# Patient Record
Sex: Male | Born: 1935
Health system: Southern US, Community
[De-identification: ages and names within clinical notes are randomized; demographics above are authoritative.]

## PROBLEM LIST (undated history)

## (undated) DIAGNOSIS — I10 Essential (primary) hypertension: Secondary | ICD-10-CM

## (undated) DIAGNOSIS — E119 Type 2 diabetes mellitus without complications: Secondary | ICD-10-CM

## (undated) DIAGNOSIS — K219 Gastro-esophageal reflux disease without esophagitis: Secondary | ICD-10-CM

## (undated) DIAGNOSIS — J189 Pneumonia, unspecified organism: Secondary | ICD-10-CM

## (undated) DIAGNOSIS — J329 Chronic sinusitis, unspecified: Secondary | ICD-10-CM

## (undated) DIAGNOSIS — I7300 Raynaud's syndrome without gangrene: Secondary | ICD-10-CM

## (undated) DIAGNOSIS — K5790 Diverticulosis of intestine, part unspecified, without perforation or abscess without bleeding: Secondary | ICD-10-CM

## (undated) DIAGNOSIS — J449 Chronic obstructive pulmonary disease, unspecified: Secondary | ICD-10-CM

## (undated) DIAGNOSIS — IMO0001 Reserved for inherently not codable concepts without codable children: Secondary | ICD-10-CM

## (undated) DIAGNOSIS — R251 Tremor, unspecified: Secondary | ICD-10-CM

## (undated) DIAGNOSIS — K602 Anal fissure, unspecified: Secondary | ICD-10-CM

## (undated) HISTORY — PX: HERNIA REPAIR: SHX51

## (undated) HISTORY — DX: Anal fissure, unspecified: K60.2

---

## 2004-06-27 ENCOUNTER — Encounter: Admission: RE | Admit: 2004-06-27 | Discharge: 2004-06-27 | Payer: Self-pay | Admitting: Orthopedic Surgery

## 2004-06-28 ENCOUNTER — Ambulatory Visit (HOSPITAL_COMMUNITY): Admission: RE | Admit: 2004-06-28 | Discharge: 2004-06-28 | Payer: Self-pay | Admitting: Orthopedic Surgery

## 2004-06-28 ENCOUNTER — Ambulatory Visit (HOSPITAL_BASED_OUTPATIENT_CLINIC_OR_DEPARTMENT_OTHER): Admission: RE | Admit: 2004-06-28 | Discharge: 2004-06-28 | Payer: Self-pay | Admitting: Orthopedic Surgery

## 2004-08-02 ENCOUNTER — Ambulatory Visit (HOSPITAL_COMMUNITY): Admission: RE | Admit: 2004-08-02 | Discharge: 2004-08-02 | Payer: Self-pay | Admitting: Orthopedic Surgery

## 2005-03-27 HISTORY — PX: COLONOSCOPY: SHX174

## 2008-03-21 ENCOUNTER — Encounter: Admission: RE | Admit: 2008-03-21 | Discharge: 2008-03-21 | Payer: Self-pay | Admitting: Internal Medicine

## 2010-06-08 NOTE — Op Note (Signed)
Johnathan [name], Johnathan [name]                 ACCOUNT NO.:  [id number]   MEDICAL RECORD NO.:  [id number]          PATIENT TYPE:  AMB   LOCATION:  DSC                          FACILITY:  [facility]   PHYSICIAN:  Katy Fitch. Sypher, M.D. 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 Thad Ranger, M.D., neurologist, on April 18, 2004.   POSTOPERATIVE DIAGNOSIS:  Chronic severe ulnar neuropathy at left cubital  tunnel with positive EMG and nerve conduction studies performed by Casimiro Needle  L. Thad Ranger, M.D., neurologist, on April 18, 2004.   OPERATION:  Decompression of left ulnar nerve at cubital tunnel.   OPERATIONS:  Katy Fitch. Sypher, M.D.   ASSISTANT:  Molly Maduro Dasnoit PA-C.   ANESTHESIA:  General by LMA, supervising anesthesiologist Dr. Sheldon Silvan.   INDICATIONS:  Johnathan [name] is a 77 year old right-hand dominant man referred  by Genene Churn. Love, M.D., neurologist, for evaluation and management of  chronic left ulnar neuropathy.  He had a history of numbness in his left  ring and small fingers dating back to approximately February 2006.  He was  initially evaluated by Dr. Janey Greaser, his family physician, and subsequently  referred to Dr. Sandria Manly for a neurologic evaluation.  Dr. Sandria Manly referred him for  electrodiagnostic studies that were completed by Dr. Kelli Hope,  neurologist, on April 18, 2004.   These were notable for significant left ulnar neuropathy with a slowing of  his conduction velocity to 36 meters per second across the elbow with  significant abnormalities noted on EMG of the flexor digitorum profundus of  the ring and small fingers in the forearm and the first dorsal interosseous,  suggesting a high ulnar nerve lesion at the level of the elbow.   Mr.  Truss was subsequently referred by Dr. Sandria Manly for a surgical opinion  regarding his ulnar neuropathy.   Clinical examination on Jun 06, 2004, demonstrated marked weakness of pinch  and ulnar-sided grasp.  The clinical examination was compatible with a McGowan stage III ulnar  neuropathy at the elbow.   We recommended to Mr. Remer that we proceed with decompression of his left  ulnar nerve across the elbow leaving the nerve in situ.   He understands that due to the severe abnormalities of his nerve function at  this time, that he will likely require between 12 and 18 months to see the  full benefit of nerve decompression.   After informed consent, he is brought to the operating room at this time.   PROCEDURE:  Johnathan [name] was brought to the operating room and placed in  supine position on the operating table.   Following anesthesia consultation with Dr. Ivin Booty, general anesthesia by LMA  was induced.   The left arm was prepped with Betadine soap and solution and sterilely  draped.   A pneumatic tourniquet was applied to the proximal brachium.  Following exsanguination of the left arm with an Esmarch bandage, the  arterial tourniquet was inflated to 220 mmHg.   The procedure commenced with a 2-cm scission just posterior to the ulnar  nerve at the cubital tunnel.  The subcutaneous tissues were carefully divided, taking care to identify the  posterior medial antebrachial cutaneous nerve.   The ulnar nerve was identified and the fascia overlying the nerve released 6  cm proximal to the cubital tunnel, releasing the arcade of Struthers.  Distal dissection released the arcuate ligament and the fascia at the head  of flexor carpi ulnaris.   There were several fascial bands deep to the head of flexor carpi ulnaris  causing compression.   With elbow flexion thereafter to 140 degrees, the nerve was stable.  The  fascia of the triceps was causing some compression of the nerve with  elbow  flexion beyond 100 degrees.  Therefore, a strip of the triceps fascia  measuring 5 mm in width was resected at the level the cubital tunnel and  proximally over a distance of 2 cm.  This fully decompressed the nerve.   The nerve remained stable through a full range of motion.   The wound was then repaired with intradermal 3-0 Prolene suture.   A compressive dressing was applied with Steri-Strips, sterile gauze and a  Tegaderm dressing, followed by Ace wrap.   There were no apparent complications.       RVS/MEDQ  D:  06/28/2004  T:  06/28/2004  Job:  [id number]   cc:   Genene Churn. Love, M.D.  [address]  [address]  [place]  [place] [address]  Fax: [phone]   Lyndee Leo. Janey Greaser, MD  [phone] [address]  [place]  [place] [address]  Fax: [phone]

## 2014-04-21 ENCOUNTER — Encounter (HOSPITAL_COMMUNITY): Payer: Self-pay | Admitting: Internal Medicine

## 2014-04-21 ENCOUNTER — Inpatient Hospital Stay (HOSPITAL_COMMUNITY)
Admission: AD | Admit: 2014-04-21 | Discharge: 2014-04-23 | DRG: 195 | Disposition: A | Discharge status: Home / Self Care | Payer: Medicare Other | Source: Ambulatory Visit | Attending: Internal Medicine | Admitting: Internal Medicine

## 2014-04-21 ENCOUNTER — Inpatient Hospital Stay (HOSPITAL_COMMUNITY): Payer: Medicare Other

## 2014-04-21 DIAGNOSIS — R0600 Dyspnea, unspecified: Secondary | ICD-10-CM

## 2014-04-21 DIAGNOSIS — K219 Gastro-esophageal reflux disease without esophagitis: Secondary | ICD-10-CM | POA: Diagnosis present

## 2014-04-21 DIAGNOSIS — R739 Hyperglycemia, unspecified: Secondary | ICD-10-CM | POA: Diagnosis present

## 2014-04-21 DIAGNOSIS — J189 Pneumonia, unspecified organism: Principal | ICD-10-CM | POA: Diagnosis present

## 2014-04-21 DIAGNOSIS — I7300 Raynaud's syndrome without gangrene: Secondary | ICD-10-CM | POA: Diagnosis present

## 2014-04-21 DIAGNOSIS — Z87891 Personal history of nicotine dependence: Secondary | ICD-10-CM

## 2014-04-21 DIAGNOSIS — J449 Chronic obstructive pulmonary disease, unspecified: Secondary | ICD-10-CM | POA: Diagnosis present

## 2014-04-21 DIAGNOSIS — J441 Chronic obstructive pulmonary disease with (acute) exacerbation: Secondary | ICD-10-CM | POA: Diagnosis present

## 2014-04-21 DIAGNOSIS — I10 Essential (primary) hypertension: Secondary | ICD-10-CM | POA: Diagnosis present

## 2014-04-21 DIAGNOSIS — E785 Hyperlipidemia, unspecified: Secondary | ICD-10-CM | POA: Diagnosis present

## 2014-04-21 HISTORY — DX: Diverticulosis of intestine, part unspecified, without perforation or abscess without bleeding: K57.90

## 2014-04-21 HISTORY — DX: Chronic sinusitis, unspecified: J32.9

## 2014-04-21 HISTORY — DX: Gastro-esophageal reflux disease without esophagitis: K21.9

## 2014-04-21 HISTORY — DX: Essential (primary) hypertension: I10

## 2014-04-21 HISTORY — DX: Pneumonia, unspecified organism: J18.9

## 2014-04-21 HISTORY — DX: Raynaud's syndrome without gangrene: I73.00

## 2014-04-21 HISTORY — DX: Chronic obstructive pulmonary disease, unspecified: J44.9

## 2014-04-21 HISTORY — DX: Reserved for inherently not codable concepts without codable children: IMO0001

## 2014-04-21 LAB — URINALYSIS, ROUTINE W REFLEX MICROSCOPIC
GLUCOSE, UA: NEGATIVE mg/dL
Hgb urine dipstick: NEGATIVE
KETONES UR: 15 mg/dL — AB
LEUKOCYTES UA: NEGATIVE
NITRITE: NEGATIVE
Protein, ur: 30 mg/dL — AB
SPECIFIC GRAVITY, URINE: 1.03 (ref 1.005–1.030)
Urobilinogen, UA: 1 mg/dL (ref 0.0–1.0)
pH: 5 (ref 5.0–8.0)

## 2014-04-21 LAB — URINE MICROSCOPIC-ADD ON

## 2014-04-21 LAB — TROPONIN I
TROPONIN I: 0.04 ng/mL — AB (ref <0.031)
Troponin I: 0.03 ng/mL (ref <0.031)

## 2014-04-21 LAB — TSH: TSH: 1.538 u[IU]/mL (ref 0.350–4.500)

## 2014-04-21 LAB — COMPREHENSIVE METABOLIC PANEL
ALBUMIN: 3.4 g/dL — AB (ref 3.5–5.2)
ALT: 24 U/L (ref 0–53)
ANION GAP: 10 (ref 5–15)
AST: 28 U/L (ref 0–37)
Alkaline Phosphatase: 62 U/L (ref 39–117)
BUN: 13 mg/dL (ref 6–23)
CO2: 26 mmol/L (ref 19–32)
Calcium: 8.6 mg/dL (ref 8.4–10.5)
Chloride: 99 mmol/L (ref 96–112)
Creatinine, Ser: 0.92 mg/dL (ref 0.50–1.35)
GFR calc non Af Amer: 78 mL/min — ABNORMAL LOW (ref >90)
GLUCOSE: 128 mg/dL — AB (ref 70–99)
POTASSIUM: 3.5 mmol/L (ref 3.5–5.1)
Sodium: 135 mmol/L (ref 135–145)
TOTAL PROTEIN: 7.1 g/dL (ref 6.0–8.3)
Total Bilirubin: 0.9 mg/dL (ref 0.3–1.2)

## 2014-04-21 LAB — CBC WITH DIFFERENTIAL/PLATELET
Basophils Absolute: 0 10*3/uL (ref 0.0–0.1)
Basophils Relative: 0 % (ref 0–1)
EOS PCT: 0 % (ref 0–5)
Eosinophils Absolute: 0 10*3/uL (ref 0.0–0.7)
HEMATOCRIT: 42.3 % (ref 39.0–52.0)
HEMOGLOBIN: 14.3 g/dL (ref 13.0–17.0)
LYMPHS PCT: 8 % — AB (ref 12–46)
Lymphs Abs: 0.9 10*3/uL (ref 0.7–4.0)
MCH: 32.4 pg (ref 26.0–34.0)
MCHC: 33.8 g/dL (ref 30.0–36.0)
MCV: 95.7 fL (ref 78.0–100.0)
MONO ABS: 1.2 10*3/uL — AB (ref 0.1–1.0)
MONOS PCT: 11 % (ref 3–12)
NEUTROS ABS: 9.1 10*3/uL — AB (ref 1.7–7.7)
Neutrophils Relative %: 81 % — ABNORMAL HIGH (ref 43–77)
Platelets: 158 10*3/uL (ref 150–400)
RBC: 4.42 MIL/uL (ref 4.22–5.81)
RDW: 13.5 % (ref 11.5–15.5)
WBC: 11.3 10*3/uL — AB (ref 4.0–10.5)

## 2014-04-21 LAB — CK TOTAL AND CKMB (NOT AT ARMC)
CK, MB: 2.3 ng/mL (ref 0.3–4.0)
Relative Index: INVALID (ref 0.0–2.5)
Total CK: 90 U/L (ref 7–232)

## 2014-04-21 LAB — D-DIMER, QUANTITATIVE: D-Dimer, Quant: 0.95 ug/mL-FEU — ABNORMAL HIGH (ref 0.00–0.48)

## 2014-04-21 MED ORDER — ATORVASTATIN CALCIUM 80 MG PO TABS
80.0000 mg | ORAL_TABLET | Freq: Every day | ORAL | Status: DC
  Administered 2014-04-21 – 2014-04-23 (×3): 80 mg via ORAL
  Filled 2014-04-21 (×3): qty 1

## 2014-04-21 MED ORDER — ASPIRIN EC 325 MG PO TBEC
325.0000 mg | DELAYED_RELEASE_TABLET | Freq: Every day | ORAL | Status: DC
  Administered 2014-04-21 – 2014-04-23 (×3): 325 mg via ORAL
  Filled 2014-04-21 (×3): qty 1

## 2014-04-21 MED ORDER — ENOXAPARIN SODIUM 40 MG/0.4ML ~~LOC~~ SOLN
40.0000 mg | SUBCUTANEOUS | Status: DC
  Administered 2014-04-21 – 2014-04-23 (×3): 40 mg via SUBCUTANEOUS
  Filled 2014-04-21 (×3): qty 0.4

## 2014-04-21 MED ORDER — LEVOFLOXACIN IN D5W 500 MG/100ML IV SOLN
500.0000 mg | INTRAVENOUS | Status: DC
  Administered 2014-04-21 – 2014-04-23 (×3): 500 mg via INTRAVENOUS
  Filled 2014-04-21 (×3): qty 100

## 2014-04-21 MED ORDER — ACETAMINOPHEN 325 MG PO TABS: 650.0000 mg | ORAL_TABLET | Freq: Four times a day (QID) | ORAL | Status: DC | PRN

## 2014-04-21 MED ORDER — DILTIAZEM HCL 30 MG PO TABS
30.0000 mg | ORAL_TABLET | Freq: Two times a day (BID) | ORAL | Status: DC
Start: 2014-04-21 — End: 2014-04-23
  Administered 2014-04-21 – 2014-04-23 (×4): 30 mg via ORAL
  Filled 2014-04-21 (×5): qty 1

## 2014-04-21 MED ORDER — BUDESONIDE-FORMOTEROL FUMARATE 160-4.5 MCG/ACT IN AERO
2.0000 | INHALATION_SPRAY | Freq: Two times a day (BID) | RESPIRATORY_TRACT | Status: DC
  Administered 2014-04-22 – 2014-04-23 (×4): 2 via RESPIRATORY_TRACT
  Filled 2014-04-21: qty 6

## 2014-04-21 MED ORDER — IOHEXOL 350 MG/ML SOLN
100.0000 mL | Freq: Once | INTRAVENOUS | Status: AC | PRN
  Administered 2014-04-21: 100 mL via INTRAVENOUS

## 2014-04-21 MED ORDER — SODIUM CHLORIDE 0.9 % IV SOLN
INTRAVENOUS | Status: AC
  Administered 2014-04-21: 14:00:00 via INTRAVENOUS

## 2014-04-21 MED ORDER — ALBUTEROL SULFATE (2.5 MG/3ML) 0.083% IN NEBU
2.5000 mg | INHALATION_SOLUTION | Freq: Four times a day (QID) | RESPIRATORY_TRACT | Status: DC | PRN
  Administered 2014-04-21: 2.5 mg via RESPIRATORY_TRACT
  Filled 2014-04-21: qty 3

## 2014-04-21 MED ORDER — ACETAMINOPHEN 650 MG RE SUPP: 650.0000 mg | Freq: Four times a day (QID) | RECTAL | Status: DC | PRN

## 2014-04-21 MED ORDER — SODIUM CHLORIDE 0.9 % IJ SOLN
3.0000 mL | Freq: Two times a day (BID) | INTRAMUSCULAR | Status: DC
  Administered 2014-04-21 – 2014-04-23 (×4): 3 mL via INTRAVENOUS

## 2014-04-21 MED ORDER — PRIMIDONE 50 MG PO TABS
50.0000 mg | ORAL_TABLET | Freq: Two times a day (BID) | ORAL | Status: DC
  Administered 2014-04-21 – 2014-04-23 (×4): 50 mg via ORAL
  Filled 2014-04-21 (×5): qty 1

## 2014-04-21 MED ORDER — TIOTROPIUM BROMIDE MONOHYDRATE 18 MCG IN CAPS
18.0000 ug | ORAL_CAPSULE | Freq: Every day | RESPIRATORY_TRACT | Status: DC
  Administered 2014-04-22 – 2014-04-23 (×2): 18 ug via RESPIRATORY_TRACT
  Filled 2014-04-21: qty 5

## 2014-04-21 NOTE — H&P (Signed)
Johnathan [name] is an 81 y.o. male.    [name] pcp   Chief Complaint: dyspnea, tachycardia HPI:  81 yo male with Copd not on home o2, who presented to the office with complaints of cough , and fever.  Pt notes mostly dry cough.   Pt denies cp, palp, n/v, diarrhea, brbpr, black stool.  CXR in office showed right mid lung airspace disease, and pt had tachycardia in the office and therefore sent for direct admission for pneumonia.    Past Medical History  Diagnosis Date  . COPD (chronic obstructive pulmonary disease)   . Raynaud's disease   . Sinusitis   . Diverticulosis   . Hypertension   . Shortness of breath dyspnea   . Pneumonia   . GERD (gastroesophageal reflux disease)     uses prilosec     Past Surgical History  Procedure Laterality Date  . Hernia repair    . Colonoscopy  03/27/2005    History reviewed. No pertinent family history.  Fhx:  + for dementia, dm2 of brother  Social History:  reports that he quit smoking about 20 years ago. His smoking use included Cigarettes. He has a 45 pack-year smoking history. He has never used smokeless tobacco. He reports that he drinks alcohol. He reports that he does not use illicit drugs.  Allergies: No Known Allergies  Medications Prior to Admission  Medication Sig Dispense Refill  . amLODipine (NORVASC) 10 MG tablet Take 10 mg by mouth daily.    Marland Kitchen dextromethorphan-guaiFENesin (MUCINEX DM) 30-600 MG per 12 hr tablet Take 1 tablet by mouth 2 (two) times daily.    . Ginkgo Biloba EXTR Take 1 tablet by mouth daily.    . Misc Natural Products (HIMALAYAN GOJI PO) Take 12 tablets by mouth daily.    . Multiple Vitamins-Minerals (MULTIVITAMIN ADULT) TABS Take 1 tablet by mouth daily.    . pravastatin (PRAVACHOL) 10 MG tablet Take 10 mg by mouth daily.    . primidone (MYSOLINE) 50 MG tablet Take 50 mg by mouth 2 (two) times daily.    . Tetrahydrozoline HCl (VISINE OP) Apply 1 drop to eye daily.      Results for orders placed or performed  during the hospital encounter of 04/21/14 (from the past 48 hour(s))  Urinalysis, Routine w reflex microscopic     Status: Abnormal   Collection Time: 04/21/14  2:25 PM  Result Value Ref Range   Color, Urine AMBER (A) YELLOW    Comment: BIOCHEMICALS MAY BE AFFECTED BY COLOR   APPearance CLEAR CLEAR   Specific Gravity, Urine 1.030 1.005 - 1.030   pH 5.0 5.0 - 8.0   Glucose, UA NEGATIVE NEGATIVE mg/dL   Hgb urine dipstick NEGATIVE NEGATIVE   Bilirubin Urine SMALL (A) NEGATIVE   Ketones, ur 15 (A) NEGATIVE mg/dL   Protein, ur 30 (A) NEGATIVE mg/dL   Urobilinogen, UA 1.0 0.0 - 1.0 mg/dL   Nitrite NEGATIVE NEGATIVE   Leukocytes, UA NEGATIVE NEGATIVE  Urine microscopic-add on     Status: Abnormal   Collection Time: 04/21/14  2:25 PM  Result Value Ref Range   Squamous Epithelial / LPF RARE RARE   WBC, UA 0-2 <3 WBC/hpf   RBC / HPF 0-2 <3 RBC/hpf   Bacteria, UA FEW (A) RARE   Crystals CA OXALATE CRYSTALS (A) NEGATIVE   Urine-Other MUCOUS PRESENT   CBC with Differential/Platelet     Status: Abnormal   Collection Time: 04/21/14  3:10 PM  Result Value  Ref Range   WBC 11.3 (H) 4.0 - 10.5 K/uL   RBC 4.42 4.22 - 5.81 MIL/uL   Hemoglobin 14.3 13.0 - 17.0 g/dL   HCT 42.3 39.0 - 52.0 %   MCV 95.7 78.0 - 100.0 fL   MCH 32.4 26.0 - 34.0 pg   MCHC 33.8 30.0 - 36.0 g/dL   RDW 13.5 11.5 - 15.5 %   Platelets 158 150 - 400 K/uL   Neutrophils Relative % 81 (H) 43 - 77 %   Neutro Abs 9.1 (H) 1.7 - 7.7 K/uL   Lymphocytes Relative 8 (L) 12 - 46 %   Lymphs Abs 0.9 0.7 - 4.0 K/uL   Monocytes Relative 11 3 - 12 %   Monocytes Absolute 1.2 (H) 0.1 - 1.0 K/uL   Eosinophils Relative 0 0 - 5 %   Eosinophils Absolute 0.0 0.0 - 0.7 K/uL   Basophils Relative 0 0 - 1 %   Basophils Absolute 0.0 0.0 - 0.1 K/uL  Comprehensive metabolic panel     Status: Abnormal   Collection Time: 04/21/14  3:10 PM  Result Value Ref Range   Sodium 135 135 - 145 mmol/L   Potassium 3.5 3.5 - 5.1 mmol/L   Chloride 99 96 - 112  mmol/L   CO2 26 19 - 32 mmol/L   Glucose, Bld 128 (H) 70 - 99 mg/dL   BUN 13 6 - 23 mg/dL   Creatinine, Ser 0.92 0.50 - 1.35 mg/dL   Calcium 8.6 8.4 - 10.5 mg/dL   Total Protein 7.1 6.0 - 8.3 g/dL   Albumin 3.4 (L) 3.5 - 5.2 g/dL   AST 28 0 - 37 U/L   ALT 24 0 - 53 U/L   Alkaline Phosphatase 62 39 - 117 U/L   Total Bilirubin 0.9 0.3 - 1.2 mg/dL   GFR calc non Af Amer 78 (L) >90 mL/min   GFR calc Af Amer >90 >90 mL/min    Comment: (NOTE) The eGFR has been calculated using the CKD EPI equation. This calculation has not been validated in all clinical situations. eGFR's persistently <90 mL/min signify possible Chronic Kidney Disease.    Anion gap 10 5 - 15  TSH     Status: None   Collection Time: 04/21/14  3:10 PM  Result Value Ref Range   TSH 1.538 0.350 - 4.500 uIU/mL  D-dimer, quantitative     Status: Abnormal   Collection Time: 04/21/14  3:10 PM  Result Value Ref Range   D-Dimer, Quant 0.95 (H) 0.00 - 0.48 ug/mL-FEU    Comment:        AT THE INHOUSE ESTABLISHED CUTOFF VALUE OF 0.48 ug/mL FEU, THIS ASSAY HAS BEEN DOCUMENTED IN THE LITERATURE TO HAVE A SENSITIVITY AND NEGATIVE PREDICTIVE VALUE OF AT LEAST 98 TO 99%.  THE TEST RESULT SHOULD BE CORRELATED WITH AN ASSESSMENT OF THE CLINICAL PROBABILITY OF DVT / VTE.   Troponin I (q 6hr x 3)     Status: Abnormal   Collection Time: 04/21/14  3:10 PM  Result Value Ref Range   Troponin I 0.04 (H) <0.031 ng/mL    Comment:        PERSISTENTLY INCREASED TROPONIN VALUES IN THE RANGE OF 0.04-0.49 ng/mL CAN BE SEEN IN:       -UNSTABLE ANGINA       -CONGESTIVE HEART FAILURE       -MYOCARDITIS       -CHEST TRAUMA       -ARRYHTHMIAS       -  LATE PRESENTING MYOCARDIAL INFARCTION       -COPD   CLINICAL FOLLOW-UP RECOMMENDED.   Culture, blood (routine x 2)     Status: None (Preliminary result)   Collection Time: 04/21/14  3:30 PM  Result Value Ref Range   Specimen Description BLOOD RIGHT HAND    Special Requests BOTTLES DRAWN  AEROBIC ONLY 6CC    Culture PENDING    Report Status PENDING    No results found.  Review of Systems  Constitutional: Positive for fever. Negative for chills, weight loss, malaise/fatigue and diaphoresis.  HENT: Negative.   Eyes: Negative.   Respiratory: Positive for cough, sputum production and shortness of breath. Negative for hemoptysis and wheezing.   Cardiovascular: Negative.   Gastrointestinal: Negative.   Genitourinary: Negative.   Musculoskeletal: Negative.   Skin: Negative.   Neurological: Negative.  Negative for weakness.  Endo/Heme/Allergies: Negative.   Psychiatric/Behavioral: Negative.     Blood pressure 104/61, pulse 126, temperature 100.1 F (37.8 C), temperature source Oral, resp. rate 18, SpO2 92 %. Physical Exam  Constitutional: He is oriented to person, place, and time. He appears well-developed and well-nourished.  HENT:  Head: Normocephalic and atraumatic.  Mouth/Throat: No oropharyngeal exudate.  Eyes: Conjunctivae and EOM are normal. Pupils are equal, round, and reactive to light. No scleral icterus.  Neck: Normal range of motion. Neck supple. No JVD present. No tracheal deviation present. No thyromegaly present.  Cardiovascular: Normal rate and regular rhythm.   Respiratory: Effort normal. No respiratory distress. He has no wheezes. He has rales.  GI: Soft. Bowel sounds are normal. He exhibits no distension. There is no tenderness. There is no rebound and no guarding.  Musculoskeletal: Normal range of motion. He exhibits no edema or tenderness.  Lymphadenopathy:    He has no cervical adenopathy.  Neurological: He is alert and oriented to person, place, and time. He has normal reflexes. He displays normal reflexes. No cranial nerve deficit. He exhibits normal muscle tone. Coordination normal.  Skin: Skin is warm and dry. No rash noted. No erythema. No pallor.  Psychiatric: He has a normal mood and affect. His behavior is normal. Judgment and thought  content normal.     Assessment/Plan Pneumonia Sputum culture Blood culture x2 Check urine legionella antigen Check cbc cmp Start on levaquin 570m iv qday  For cap  Tachycardia Check d dimer and if positive CTA chest Trop i q6h x3 Start cardizem 320mpo bid  Copd o2 2L [place] spiriva 1puff qday Albuterol prn  DVT prophylaxis scd and lovenox  KIJani Gravel/31/2016, 8:49 PM

## 2014-04-22 LAB — LEGIONELLA ANTIGEN, URINE

## 2014-04-22 LAB — TROPONIN I: Troponin I: <0.03 ng/mL (ref <0.031)

## 2014-04-22 LAB — LACTIC ACID, PLASMA: Lactic Acid, Venous: 0.8 mmol/L (ref 0.5–2.0)

## 2014-04-22 NOTE — Progress Notes (Signed)
Subjective: Pneumonia:  Pt feels better.  Afebrile.  Hr decreased.   Tachcardia: resolved Copd:  Breathing improved.  Stable on inhalers.  Protein calorie malnutrition:  Pt is trying to eat better.   Objective: Vital signs in last 24 hours: Temp:  [98.3 F (36.8 C)-99.4 F (37.4 C)] 99.4 F (37.4 C) (04/01 2216) Pulse Rate:  [76-85] 85 (04/01 2216) Resp:  [15-17] 15 (04/01 2216) BP: (107-135)/(58-66) 135/66 mmHg (04/01 2216) SpO2:  [90 %-96 %] 90 % (04/01 2216) Weight:  [68.3 kg (150 lb 9.2 oz)] 68.3 kg (150 lb 9.2 oz) (04/01 0500) Weight change:  Last BM Date: 04/21/14  Intake/Output from previous day: 03/31 0701 - 04/01 0700 In: 506.3 [P.O.:120; I.V.:386.3] Out: 0  Intake/Output this shift:    Heent: anicteric Neck: no jvd Heart: rrr s1, s2 Lung: bron chial bs right mid lung Abd: soft, nt, nd, +bs Ext: no c/c/e   Lab Results:  Recent Labs  04/21/14 1510  WBC 11.3*  HGB 14.3  HCT 42.3  PLT 158   BMET  Recent Labs  04/21/14 1510  NA 135  K 3.5  CL 99  CO2 26  GLUCOSE 128*  BUN 13  CREATININE 0.92  CALCIUM 8.6    Studies/Results: Ct Angio Chest Pe W/cm &/or Wo Cm  04/21/2014   CLINICAL DATA:  Cough. Short of breath. Worsening shortness of breath over the last week. Pneumonia on radiograph earlier today.  EXAM: CT ANGIOGRAPHY CHEST WITH CONTRAST  TECHNIQUE: Multidetector CT imaging of the chest was performed using the standard protocol during bolus administration of intravenous contrast. Multiplanar CT image reconstructions and MIPs were obtained to evaluate the vascular anatomy.  CONTRAST:  128mL OMNIPAQUE IOHEXOL 350 MG/ML SOLN  COMPARISON:  04/21/2014 chest radiograph.  FINDINGS: Bones: No aggressive osseous lesions. Lower thoracic Schmorl's nodes and likely chronic T11 compression fracture.  Cardiovascular: Technically adequate study. No pulmonary embolus. No acute aortic abnormality. Aortic arch and [place] vessel atherosclerosis. Coronary artery  atherosclerosis is present. If office based assessment of coronary risk factors has not been performed, it is now recommended. Enlarged pulmonary arteries compatible with secondary pulmonary hypertension.  Lungs: LEFT lung shows dependent atelectasis. Multifocal airspace disease is present in the RIGHT lung. Of this involves predominantly the RIGHT upper and RIGHT lower lobe with minimal involvement of the RIGHT middle lobe. Atelectasis in the RIGHT middle lobe. Underlying severe emphysema is present.  Central airways: Patent.  Effusions: None.  Lymphadenopathy: No axillary adenopathy. No mediastinal adenopathy. Negative for hilar adenopathy.  Esophagus: Patulous gastroesophageal junction.  Upper abdomen: Within normal limits.  Other: None.  Review of the MIP images confirms the above findings.  IMPRESSION: 1. Multilobar multifocal pneumonia of the RIGHT lung. Followup in 4-6 weeks to ensure radiographic clearing and exclude an underlying lesion is recommended. 2. Severe centrilobular emphysema. 3. Atherosclerosis and coronary artery disease.   Electronically Signed   By: Dereck Ligas M.D.   On: 04/21/2014 20:47    Medications: I have reviewed the patient's current medications.  Assessment/Plan: Pneumonia, cap Cont levaquin 500mg  iv qday D#2  Tachycardia Resolved  Copd Cont symbicort, spiriva, albuterol  Hyperglycemia Consider hga1c   DVT prophylaxis:  lovenox   LOS: 1 day   [name] 04/22/2014, 10:25 PM

## 2014-04-23 MED ORDER — TIOTROPIUM BROMIDE MONOHYDRATE 18 MCG IN CAPS: 18.0000 ug | ORAL_CAPSULE | Freq: Every day | RESPIRATORY_TRACT | Status: DC

## 2014-04-23 MED ORDER — DILTIAZEM HCL 30 MG PO TABS: 30.0000 mg | ORAL_TABLET | Freq: Two times a day (BID) | ORAL | Status: DC

## 2014-04-23 MED ORDER — DILTIAZEM HCL 30 MG PO TABS
30.0000 mg | ORAL_TABLET | Freq: Once | ORAL | Status: AC
  Administered 2014-04-23: 30 mg via ORAL
  Filled 2014-04-23: qty 1

## 2014-04-23 MED ORDER — LEVOFLOXACIN 500 MG PO TABS: 500.0000 mg | ORAL_TABLET | Freq: Every day | ORAL | Status: DC

## 2014-04-23 MED ORDER — BENZONATATE 100 MG PO CAPS: 100.0000 mg | ORAL_CAPSULE | Freq: Three times a day (TID) | ORAL | Status: DC | PRN

## 2014-04-23 MED ORDER — HYDROCOD POLST-CHLORPHEN POLST 10-8 MG/5ML PO LQCR
5.0000 mL | Freq: Two times a day (BID) | ORAL | Status: DC | PRN
Start: 2014-04-23 — End: 2014-04-23
  Administered 2014-04-23: 5 mL via ORAL
  Filled 2014-04-23: qty 5

## 2014-04-23 MED ORDER — BENZONATATE 100 MG PO CAPS
100.0000 mg | ORAL_CAPSULE | Freq: Three times a day (TID) | ORAL | Status: DC | PRN
Start: 2014-04-23 — End: 2014-04-23
  Filled 2014-04-23: qty 1

## 2014-04-23 MED ORDER — BUDESONIDE-FORMOTEROL FUMARATE 160-4.5 MCG/ACT IN AERO: 2.0000 | INHALATION_SPRAY | Freq: Two times a day (BID) | RESPIRATORY_TRACT | Status: DC

## 2014-04-23 MED ORDER — PRIMIDONE 50 MG PO TABS
50.0000 mg | ORAL_TABLET | Freq: Once | ORAL | Status: AC
  Administered 2014-04-23: 50 mg via ORAL
  Filled 2014-04-23: qty 1

## 2014-04-23 NOTE — Progress Notes (Addendum)
Nsg Discharge Note  Admit Date:  04/21/2014 Discharge date: 04/23/2014   Johnathan [name] to be D/C'd Home per MD order.  AVS completed.  Copy for chart, and copy for patient signed, and dated. Patient/caregiver able to verbalize understanding.  Discharge Medication:   Medication List    STOP taking these medications        amLODipine 2.5 MG tablet  Commonly known as:  NORVASC      TAKE these medications        benzonatate 100 MG capsule  Commonly known as:  TESSALON  Take 1 capsule (100 mg total) by mouth 3 (three) times daily as needed for cough.     budesonide-formoterol 160-4.5 MCG/ACT inhaler  Commonly known as:  SYMBICORT  Inhale 2 puffs into the lungs 2 (two) times daily.     dextromethorphan-guaiFENesin 30-600 MG per 12 hr tablet  Commonly known as:  MUCINEX DM  Take 1 tablet by mouth 2 (two) times daily.     diltiazem 30 MG tablet  Commonly known as:  CARDIZEM  Take 1 tablet (30 mg total) by mouth 2 (two) times daily.     Ginkgo Biloba Extr  Take 1 tablet by mouth daily.     HIMALAYAN GOJI PO  Take 12 tablets by mouth daily.     levofloxacin 500 MG tablet  Commonly known as:  LEVAQUIN  Take 1 tablet (500 mg total) by mouth daily.     MULTIVITAMIN ADULT Tabs  Take 1 tablet by mouth daily.     pravastatin 40 MG tablet  Commonly known as:  PRAVACHOL  Take 40 mg by mouth daily.     primidone 50 MG tablet  Commonly known as:  MYSOLINE  Take 50 mg by mouth 2 (two) times daily.     tiotropium 18 MCG inhalation capsule  Commonly known as:  SPIRIVA  Place 1 capsule (18 mcg total) into inhaler and inhale daily.     VISINE OP  Apply 1 drop to eye daily.        Discharge Assessment: Filed Vitals:   04/23/14 1614  BP: 120/75  Pulse: 76  Temp: 98.4 F (36.9 C)  Resp: 16   Skin clean, dry and intact without evidence of skin break down, no evidence of skin tears noted. IV removed from left and right forearm. Site without signs and symptoms of  complications - no redness or edema noted at insertion site, patient denies c/o pain - only slight tenderness at site.  Dressing with slight pressure applied.  Verbal order to give Primidone and Cardizem before discharge.  D/c Instructions-Education: Discharge instructions given to patient/family with verbalized understanding. D/c education completed with patient/family including follow up instructions, medication list, d/c activities limitations if indicated, with other d/c instructions as indicated by MD - patient able to verbalize understanding, all questions fully answered. Patient instructed to return to ED, call 911, or call MD for any changes in condition.  Patient escorted via [place], and D/C home via private auto.  Wheeled down via wheelchair by staff to vehicle.  In stable condition at time of departure.  Gleason, Ginger Margaretha Sheffield, RN 04/23/2014 7:46 PM

## 2014-04-23 NOTE — Discharge Summary (Signed)
Physician Discharge Summary  Patient ID: Johnathan [name] MRN: [id number] DOB/AGE: [birth year] 81 y.o.  Admit date: 04/21/2014 Discharge date: 04/23/2014  Admission Diagnoses: Pneumonia Copd  Discharge Diagnoses:  Active Problems:   Pneumonia   COPD (chronic obstructive pulmonary disease)   Discharged Condition: stable  Hospital Course: 81 yo male with Copd, c/o cough and not feeling well, presented to the office and was felt to have pneumonia.  Pt was sent to hospital and therefore admitted,  CTA chest showed evidence of multifocal pneumonia.  Pt had tachycardia which improved with hydration.  Pt improved with iv levaquin.  Pt appears stable and will be discharged to home.   Consults: none  Significant Diagnostic Studies: labs: CTA chest  Treatments: iv levaquin  Discharge Exam: Blood pressure 120/75, pulse 76, temperature 98.4 F (36.9 C), temperature source Oral, resp. rate 16, weight 67.3 kg (148 lb 5.9 oz), SpO2 97 %. Heent: anicteric Neck: no jvd Heart: rrr s1, s2 Lung: slight bronchial bs in the right mid lung Abd: soft, nt, nd, +bs Ext: no c/c/e  A/P: Pneumonia Cont levaquin 500mg  po qday x 7 days Tessalon pearles 100mg  po tid prn  Copd Cont symbicort 2puff bid Cont spiriva 1puff qday  Hypertension Stop amlodipine cardizem 30mg  po bid  Hyperlipidemia Cont pravastatin   Dispo: f./u with pcp in 2-4 weeks.   Disposition: Final discharge disposition not confirmed    HOME     Medication List    STOP taking these medications        amLODipine 2.5 MG tablet  Commonly known as:  NORVASC      TAKE these medications        benzonatate 100 MG capsule  Commonly known as:  TESSALON  Take 1 capsule (100 mg total) by mouth 3 (three) times daily as needed for cough.     budesonide-formoterol 160-4.5 MCG/ACT inhaler  Commonly known as:  SYMBICORT  Inhale 2 puffs into the lungs 2 (two) times daily.     dextromethorphan-guaiFENesin 30-600 MG per 12 hr tablet   Commonly known as:  MUCINEX DM  Take 1 tablet by mouth 2 (two) times daily.     diltiazem 30 MG tablet  Commonly known as:  CARDIZEM  Take 1 tablet (30 mg total) by mouth 2 (two) times daily.     Ginkgo Biloba Extr  Take 1 tablet by mouth daily.     HIMALAYAN GOJI PO  Take 12 tablets by mouth daily.     levofloxacin 500 MG tablet  Commonly known as:  LEVAQUIN  Take 1 tablet (500 mg total) by mouth daily.     MULTIVITAMIN ADULT Tabs  Take 1 tablet by mouth daily.     pravastatin 40 MG tablet  Commonly known as:  PRAVACHOL  Take 40 mg by mouth daily.     primidone 50 MG tablet  Commonly known as:  MYSOLINE  Take 50 mg by mouth 2 (two) times daily.     tiotropium 18 MCG inhalation capsule  Commonly known as:  SPIRIVA  Place 1 capsule (18 mcg total) into inhaler and inhale daily.     VISINE OP  Apply 1 drop to eye daily.           Follow-up Information    Follow up with [name], MD In 4 weeks.   Specialty:  Internal Medicine   Contact information:   [address] [place] [address] [phone]       Signed: Maudie Mercury,  Jillene Wehrenberg 04/23/2014, 7:13 PM

## 2014-04-27 LAB — CULTURE, BLOOD (ROUTINE X 2)
Culture: NO GROWTH
Culture: NO GROWTH

## 2014-09-22 DIAGNOSIS — Z86010 Personal history of colonic polyps: Secondary | ICD-10-CM | POA: Diagnosis not present

## 2014-09-22 DIAGNOSIS — J449 Chronic obstructive pulmonary disease, unspecified: Secondary | ICD-10-CM | POA: Diagnosis not present

## 2015-01-06 DIAGNOSIS — I10 Essential (primary) hypertension: Secondary | ICD-10-CM | POA: Diagnosis not present

## 2015-01-06 DIAGNOSIS — E7800 Pure hypercholesterolemia, unspecified: Secondary | ICD-10-CM | POA: Diagnosis not present

## 2015-05-17 DIAGNOSIS — I10 Essential (primary) hypertension: Secondary | ICD-10-CM | POA: Diagnosis not present

## 2015-05-17 DIAGNOSIS — R739 Hyperglycemia, unspecified: Secondary | ICD-10-CM | POA: Diagnosis not present

## 2015-05-22 DIAGNOSIS — E7800 Pure hypercholesterolemia, unspecified: Secondary | ICD-10-CM | POA: Diagnosis not present

## 2015-05-22 DIAGNOSIS — R739 Hyperglycemia, unspecified: Secondary | ICD-10-CM | POA: Diagnosis not present

## 2015-05-22 DIAGNOSIS — R251 Tremor, unspecified: Secondary | ICD-10-CM | POA: Diagnosis not present

## 2015-05-22 DIAGNOSIS — I10 Essential (primary) hypertension: Secondary | ICD-10-CM | POA: Diagnosis not present

## 2015-06-20 DIAGNOSIS — I10 Essential (primary) hypertension: Secondary | ICD-10-CM | POA: Diagnosis not present

## 2015-06-20 DIAGNOSIS — Z0100 Encounter for examination of eyes and vision without abnormal findings: Secondary | ICD-10-CM | POA: Diagnosis not present

## 2015-08-18 DIAGNOSIS — I10 Essential (primary) hypertension: Secondary | ICD-10-CM | POA: Diagnosis not present

## 2015-08-18 DIAGNOSIS — R739 Hyperglycemia, unspecified: Secondary | ICD-10-CM | POA: Diagnosis not present

## 2015-08-24 DIAGNOSIS — E7800 Pure hypercholesterolemia, unspecified: Secondary | ICD-10-CM | POA: Diagnosis not present

## 2015-08-24 DIAGNOSIS — I10 Essential (primary) hypertension: Secondary | ICD-10-CM | POA: Diagnosis not present

## 2015-08-24 DIAGNOSIS — R739 Hyperglycemia, unspecified: Secondary | ICD-10-CM | POA: Diagnosis not present

## 2015-09-07 DIAGNOSIS — Z1212 Encounter for screening for malignant neoplasm of rectum: Secondary | ICD-10-CM | POA: Diagnosis not present

## 2015-12-28 DIAGNOSIS — R739 Hyperglycemia, unspecified: Secondary | ICD-10-CM | POA: Diagnosis not present

## 2015-12-28 DIAGNOSIS — I10 Essential (primary) hypertension: Secondary | ICD-10-CM | POA: Diagnosis not present

## 2016-01-03 DIAGNOSIS — E7800 Pure hypercholesterolemia, unspecified: Secondary | ICD-10-CM | POA: Diagnosis not present

## 2016-01-03 DIAGNOSIS — R05 Cough: Secondary | ICD-10-CM | POA: Diagnosis not present

## 2016-01-03 DIAGNOSIS — I10 Essential (primary) hypertension: Secondary | ICD-10-CM | POA: Diagnosis not present

## 2016-01-03 DIAGNOSIS — R739 Hyperglycemia, unspecified: Secondary | ICD-10-CM | POA: Diagnosis not present

## 2016-01-22 DIAGNOSIS — J189 Pneumonia, unspecified organism: Secondary | ICD-10-CM

## 2016-01-22 HISTORY — DX: Pneumonia, unspecified organism: J18.9

## 2017-05-15 ENCOUNTER — Encounter (HOSPITAL_COMMUNITY): Payer: Self-pay | Admitting: Emergency Medicine

## 2017-05-15 ENCOUNTER — Emergency Department (HOSPITAL_COMMUNITY)
Admission: EM | Admit: 2017-05-15 | Discharge: 2017-05-16 | Disposition: A | Discharge status: Home / Self Care | Payer: Medicare PPO | Attending: Emergency Medicine | Admitting: Emergency Medicine

## 2017-05-15 ENCOUNTER — Other Ambulatory Visit: Payer: Self-pay

## 2017-05-15 ENCOUNTER — Emergency Department (HOSPITAL_COMMUNITY): Payer: Medicare PPO

## 2017-05-15 DIAGNOSIS — Z79899 Other long term (current) drug therapy: Secondary | ICD-10-CM | POA: Insufficient documentation

## 2017-05-15 DIAGNOSIS — I10 Essential (primary) hypertension: Secondary | ICD-10-CM | POA: Insufficient documentation

## 2017-05-15 DIAGNOSIS — J40 Bronchitis, not specified as acute or chronic: Secondary | ICD-10-CM | POA: Diagnosis not present

## 2017-05-15 DIAGNOSIS — R059 Cough, unspecified: Secondary | ICD-10-CM

## 2017-05-15 DIAGNOSIS — J449 Chronic obstructive pulmonary disease, unspecified: Secondary | ICD-10-CM | POA: Insufficient documentation

## 2017-05-15 DIAGNOSIS — R05 Cough: Secondary | ICD-10-CM | POA: Diagnosis present

## 2017-05-15 DIAGNOSIS — R739 Hyperglycemia, unspecified: Secondary | ICD-10-CM | POA: Diagnosis not present

## 2017-05-15 DIAGNOSIS — R310 Gross hematuria: Secondary | ICD-10-CM | POA: Diagnosis not present

## 2017-05-15 DIAGNOSIS — Z87891 Personal history of nicotine dependence: Secondary | ICD-10-CM | POA: Diagnosis not present

## 2017-05-15 MED ORDER — ALBUTEROL SULFATE (2.5 MG/3ML) 0.083% IN NEBU
5.0000 mg | INHALATION_SOLUTION | Freq: Once | RESPIRATORY_TRACT | Status: AC
  Administered 2017-05-15: 5 mg via RESPIRATORY_TRACT
  Filled 2017-05-15: qty 6

## 2017-05-15 NOTE — ED Notes (Signed)
PT TO XRAY WILL COMPLETE EKG WHEN PT RETURNS

## 2017-05-15 NOTE — ED Triage Notes (Signed)
Patient complaining of a cough that started two days ago. Patient states he can not quit coughing. Patient also complains of blood in his urine.

## 2017-05-15 NOTE — ED Provider Notes (Signed)
[place] DEPT Provider Note   CSN: [id number] Arrival date & time: 05/15/17  2243     History   Chief Complaint Chief Complaint  Patient presents with  . Cough  . Hematuria    HPI Johnathan [name] is a 84 y.o. male.  The history is provided by the patient.  Cough   Hematuria   He has history of hypertension, COPD, GERD, diverticulosis, Raynaud's disease and comes in with 3-day history of nonproductive cough.  He denies fever or chills.  Denies any chest pain.  He denies dyspnea except during coughing paroxysms.  He has noted that cough is somewhat improved after using albuterol inhaler, but he states he only uses it when he really needs it.  He is also complaining of blood in his urine.  There is no dysuria and no abdominal pain or flank pain.  He has noted urinary urgency and tenesmus.  He has not had anything to treat this.  Past Medical History:  Diagnosis Date  . COPD (chronic obstructive pulmonary disease) ([place])   . Diverticulosis   . GERD (gastroesophageal reflux disease)    uses prilosec   . Hypertension   . Pneumonia   . Raynaud's disease   . Shortness of breath dyspnea   . Sinusitis     Patient Active Problem List   Diagnosis Date Noted  . Pneumonia 04/21/2014  . COPD (chronic obstructive pulmonary disease) ([place]) 04/21/2014    Past Surgical History:  Procedure Laterality Date  . COLONOSCOPY  03/27/2005  . HERNIA REPAIR          Home Medications    Prior to Admission medications   Medication Sig Start Date End Date Taking? Authorizing Provider  benzonatate (TESSALON) 100 MG capsule Take 1 capsule (100 mg total) by mouth 3 (three) times daily as needed for cough. 04/23/14   [name], MD  budesonide-formoterol [facility]) 160-4.5 MCG/ACT inhaler Inhale 2 puffs into the lungs 2 (two) times daily. 04/23/14   [name], MD  dextromethorphan-guaiFENesin [facility] DM) 30-600 MG per 12 hr tablet Take 1 tablet by mouth 2 (two)  times daily.    [provider]  diltiazem (CARDIZEM) 30 MG tablet Take 1 tablet (30 mg total) by mouth 2 (two) times daily. 04/23/14   [name], MD  Ginkgo Biloba EXTR Take 1 tablet by mouth daily.    [provider]  levofloxacin (LEVAQUIN) 500 MG tablet Take 1 tablet (500 mg total) by mouth daily. 04/23/14   [name], MD  Misc Natural Products (HIMALAYAN GOJI PO) Take 12 tablets by mouth daily.    [provider]  Multiple Vitamins-Minerals (MULTIVITAMIN ADULT) TABS Take 1 tablet by mouth daily.    [provider]  pravastatin (PRAVACHOL) 40 MG tablet Take 40 mg by mouth daily.    [provider]  primidone (MYSOLINE) 50 MG tablet Take 50 mg by mouth 2 (two) times daily.    [provider]  Tetrahydrozoline HCl (VISINE OP) Apply 1 drop to eye daily.    [provider]  tiotropium (SPIRIVA) 18 MCG inhalation capsule Place 1 capsule (18 mcg total) into inhaler and inhale daily. 04/23/14   [name], MD    Family History Family History  Problem Relation Age of Onset  . Dementia Brother   . Diabetes Brother     Social History Social History   Tobacco Use  . Smoking status: Former Smoker    Packs/day: 1.00    Years:  45.00    Pack years: 45.00    Types: Cigarettes    Last attempt to quit: 01/21/1994    Years since quitting: 23.3  . Smokeless tobacco: Never Used  Substance Use Topics  . Alcohol use: Yes    Comment: a drink daily with dinner  . Drug use: No     Allergies   Patient has no known allergies.   Review of Systems Review of Systems  Respiratory: Positive for cough.   Genitourinary: Positive for hematuria.  All other systems reviewed and are negative.    Physical Exam Updated Vital Signs BP (!) 157/81 (BP Location: Right Arm)   Pulse (!) 102   Temp 99.2 F (37.3 C) (Oral)   Resp 18   Ht 5' 9.5" (1.765 m)   Wt 68 kg (150 lb)   SpO2 93%   BMI 21.83 kg/m   Physical Exam  Nursing note and  vitals reviewed.  84 year old male, resting comfortably and in no acute distress. Vital signs are significant for borderline elevated heart rate, and elevated systolic blood pressure. Oxygen saturation is 93%, which is normal. Head is normocephalic and atraumatic. PERRLA, EOMI. Oropharynx is clear. Neck is nontender and supple without adenopathy or JVD. Back is nontender and there is no CVA tenderness. Lungs are have a slightly prolonged exhalation phase without overt rales, wheezes, or rhonchi. Chest is nontender. Heart has regular rate and rhythm without murmur. Abdomen is soft, flat, nontender without masses or hepatosplenomegaly and peristalsis is normoactive. Extremities have no cyanosis or edema, full range of motion is present. Skin is warm and dry without rash. Neurologic: Mental status is normal, cranial nerves are intact, there are no motor or sensory deficits.  ED Treatments / Results  Labs (all labs ordered are listed, but only abnormal results are displayed) Labs Reviewed  URINALYSIS, ROUTINE W REFLEX MICROSCOPIC - Abnormal; Notable for the following components:      Result Value   Hgb urine dipstick MODERATE (*)    Ketones, ur 5 (*)    Protein, ur 100 (*)    RBC / HPF >50 (*)    Bacteria, UA RARE (*)    All other components within normal limits  BASIC METABOLIC PANEL - Abnormal; Notable for the following components:   Glucose, Bld 161 (*)    GFR calc non Af Amer 54 (*)    All other components within normal limits  CBC - Abnormal; Notable for the following components:   WBC 15.5 (*)    All other components within normal limits  URINE CULTURE   Radiology Dg Chest 2 View  Result Date: 05/15/2017 CLINICAL DATA:  Cough times 48 hours with dyspnea. EXAM: CHEST - 2 VIEW COMPARISON:  03/18/2017 FINDINGS: Emphysematous hyperinflation of the lungs with minimal atelectasis and/or scarring at the right lung base. Heart and mediastinal contours are within normal limits.  Nonaneurysmal atherosclerotic aorta. Acute osseous abnormality. IMPRESSION: Emphysematous hyperinflation of the lungs. Right basilar scarring. No acute pulmonary consolidation or CHF. Electronically Signed   By: Ashley Royalty M.D.   On: 05/15/2017 23:45    Procedures Procedures  Medications Ordered in ED Medications  albuterol (PROVENTIL) (2.5 MG/3ML) 0.083% nebulizer solution 5 mg (5 mg Nebulization Given 05/15/17 2345)     Initial Impression / Assessment and Plan / ED Course  I have reviewed the triage vital signs and the nursing notes.  Pertinent labs & imaging results that were available during my care of the patient were reviewed by  me and considered in my medical decision making (see chart for details).  Cough which I suspect is related to his COPD, possible viral bronchitis.  Chest x-ray shows no evidence of pneumonia.  Old records are reviewed, and he does have a past admission for community-acquired pneumonia and COPD.  He will be given therapeutic trial of albuterol with ipratropium.  Gross hematuria of uncertain cause.  Suspect urinary tract infection.  Will need to check urinalysis.  No flank pain to suggest urolithiasis.  Of note, he is not on any anticoagulants.  2:17 AM Cough is much improved following albuterol with ipratropium via nebulizer.  He is given a dose of prednisone.  WBC is somewhat elevated, but this is nonspecific.  Hemoglobin and platelets are normal.  Electrolytes and renal function are normal, glucose is borderline elevated at 161.  This will need to be followed as an outpatient.  Patient has not given urine sample at this point.  Encouraged to give [place] a urine sample to check for possible infection.  6:43 AM Urinalysis has come back with greater than 50 RBCs but 0-5 WBCs and rare bacteria.  Specimen is been sent for culture.  He will be discharged with prescription for cephalexin pending urine culture report.  Also given prescriptions for prednisone and  benzonatate.  He does not have albuterol inhaler listed in his medication list, so is given a prescription for this.  He feels he has that at home, but prescription is given just in case he is mistaken.  Advised to use the inhaler and benzonatate as needed for cough suppression.  Follow-up with PCP to ensure clearing of his hematuria, consider urology referral.  Also, patient advised of elevated glucose.  This will need to be followed by his PCP.  Final Clinical Impressions(s) / ED Diagnoses   Final diagnoses:  Cough  Gross hematuria  Hyperglycemia  Bronchitis    ED Discharge Orders        Ordered    benzonatate (TESSALON) 100 MG capsule  3 times daily PRN     05/16/17 0633    predniSONE (DELTASONE) 50 MG tablet  Daily     05/16/17 0633    cephALEXin (KEFLEX) 500 MG capsule  3 times daily     05/16/17 0633    albuterol (PROVENTIL HFA;VENTOLIN HFA) 108 (90 Base) MCG/ACT inhaler  Every 4 hours PRN     33/35/45 6256       Delora Fuel, MD 38/93/73 [phone]

## 2017-05-16 LAB — CBC
HCT: 45.5 % (ref 39.0–52.0)
HEMOGLOBIN: 15.4 g/dL (ref 13.0–17.0)
MCH: 32.8 pg (ref 26.0–34.0)
MCHC: 33.8 g/dL (ref 30.0–36.0)
MCV: 97 fL (ref 78.0–100.0)
Platelets: 183 10*3/uL (ref 150–400)
RBC: 4.69 MIL/uL (ref 4.22–5.81)
RDW: 14.2 % (ref 11.5–15.5)
WBC: 15.5 10*3/uL — ABNORMAL HIGH (ref 4.0–10.5)

## 2017-05-16 LAB — URINALYSIS, ROUTINE W REFLEX MICROSCOPIC
Bilirubin Urine: NEGATIVE
GLUCOSE, UA: NEGATIVE mg/dL
KETONES UR: 5 mg/dL — AB
LEUKOCYTES UA: NEGATIVE
NITRITE: NEGATIVE
PH: 6 (ref 5.0–8.0)
Protein, ur: 100 mg/dL — AB
RBC / HPF: >50 RBC/hpf — ABNORMAL HIGH (ref 0–5)
Specific Gravity, Urine: 1.024 (ref 1.005–1.030)

## 2017-05-16 LAB — BASIC METABOLIC PANEL
Anion gap: 13 (ref 5–15)
BUN: 17 mg/dL (ref 6–20)
CO2: 24 mmol/L (ref 22–32)
Calcium: 9.4 mg/dL (ref 8.9–10.3)
Chloride: 101 mmol/L (ref 101–111)
Creatinine, Ser: 1.2 mg/dL (ref 0.61–1.24)
GFR calc Af Amer: >60 mL/min (ref >60)
GFR calc non Af Amer: 54 mL/min — ABNORMAL LOW (ref >60)
GLUCOSE: 161 mg/dL — AB (ref 65–99)
Potassium: 4.1 mmol/L (ref 3.5–5.1)
Sodium: 138 mmol/L (ref 135–145)

## 2017-05-16 MED ORDER — BENZONATATE 100 MG PO CAPS: 100.0000 mg | ORAL_CAPSULE | Freq: Three times a day (TID) | ORAL | 0 refills | Status: DC | PRN

## 2017-05-16 MED ORDER — CEPHALEXIN 500 MG PO CAPS: 500.0000 mg | ORAL_CAPSULE | Freq: Three times a day (TID) | ORAL | 0 refills | Status: DC

## 2017-05-16 MED ORDER — CEPHALEXIN 500 MG PO CAPS
500.0000 mg | ORAL_CAPSULE | Freq: Once | ORAL | Status: AC
  Administered 2017-05-16: 500 mg via ORAL
  Filled 2017-05-16: qty 1

## 2017-05-16 MED ORDER — IPRATROPIUM-ALBUTEROL 0.5-2.5 (3) MG/3ML IN SOLN
3.0000 mL | Freq: Once | RESPIRATORY_TRACT | Status: AC
  Administered 2017-05-16: 3 mL via RESPIRATORY_TRACT
  Filled 2017-05-16: qty 3

## 2017-05-16 MED ORDER — PREDNISONE 20 MG PO TABS
60.0000 mg | ORAL_TABLET | Freq: Once | ORAL | Status: AC
  Administered 2017-05-16: 60 mg via ORAL
  Filled 2017-05-16: qty 3

## 2017-05-16 MED ORDER — PREDNISONE 50 MG PO TABS: 50.0000 mg | ORAL_TABLET | Freq: Every day | ORAL | 0 refills | Status: DC

## 2017-05-16 MED ORDER — ALBUTEROL SULFATE HFA 108 (90 BASE) MCG/ACT IN AERS: 2.0000 | INHALATION_SPRAY | RESPIRATORY_TRACT | 0 refills | Status: DC | PRN

## 2017-05-16 NOTE — Discharge Instructions (Addendum)
Your x-ray did not show any sign of pneumonia.  Your cough is probably from a viral infection called bronchitis.  Use your albuterol inhaler every 4 hours as needed to suppress the cough.  Your urine did not show clear signs of infection, but has been sent for culture.  Follow-up with your primary care provider in 1 week to make sure that the cough and the bleeding have improved.  If there is any question, you will need to see a urologist to make sure there is not a tumor in your bladder causing the bleeding.  Your blood sugar today was slightly elevated-161.  This will need to be followed closely by your primary care provider.  If it continues to be elevated in this range, it could indicate diabetes and would need to be treated.

## 2017-05-16 NOTE — ED Notes (Signed)
Pt aware urine sample is needed 

## 2017-05-16 NOTE — ED Notes (Signed)
Pt given urinal and made aware of need for urine specimen 

## 2017-05-17 LAB — URINE CULTURE: Special Requests: NORMAL

## 2017-09-09 ENCOUNTER — Other Ambulatory Visit: Payer: Self-pay | Admitting: Internal Medicine

## 2017-09-09 DIAGNOSIS — M545 Low back pain: Secondary | ICD-10-CM

## 2017-09-14 ENCOUNTER — Ambulatory Visit
Admission: RE | Admit: 2017-09-14 | Discharge: 2017-09-14 | Disposition: A | Discharge status: Home / Self Care | Payer: Medicare PPO | Source: Ambulatory Visit | Attending: Internal Medicine | Admitting: Internal Medicine

## 2017-09-14 DIAGNOSIS — M545 Low back pain: Secondary | ICD-10-CM

## 2018-03-03 DIAGNOSIS — J441 Chronic obstructive pulmonary disease with (acute) exacerbation: Secondary | ICD-10-CM | POA: Diagnosis not present

## 2018-03-03 DIAGNOSIS — J9801 Acute bronchospasm: Secondary | ICD-10-CM | POA: Diagnosis not present

## 2018-03-03 DIAGNOSIS — J449 Chronic obstructive pulmonary disease, unspecified: Secondary | ICD-10-CM | POA: Diagnosis not present

## 2018-04-09 DIAGNOSIS — I10 Essential (primary) hypertension: Secondary | ICD-10-CM | POA: Diagnosis not present

## 2018-04-09 DIAGNOSIS — J449 Chronic obstructive pulmonary disease, unspecified: Secondary | ICD-10-CM | POA: Diagnosis not present

## 2018-04-09 DIAGNOSIS — M25551 Pain in right hip: Secondary | ICD-10-CM | POA: Diagnosis not present

## 2018-04-09 DIAGNOSIS — E7800 Pure hypercholesterolemia, unspecified: Secondary | ICD-10-CM | POA: Diagnosis not present

## 2018-05-08 DIAGNOSIS — I10 Essential (primary) hypertension: Secondary | ICD-10-CM | POA: Diagnosis not present

## 2018-05-08 DIAGNOSIS — E118 Type 2 diabetes mellitus with unspecified complications: Secondary | ICD-10-CM | POA: Diagnosis not present

## 2018-05-08 DIAGNOSIS — Z0000 Encounter for general adult medical examination without abnormal findings: Secondary | ICD-10-CM | POA: Diagnosis not present

## 2018-05-15 DIAGNOSIS — I10 Essential (primary) hypertension: Secondary | ICD-10-CM | POA: Diagnosis not present

## 2018-05-15 DIAGNOSIS — R251 Tremor, unspecified: Secondary | ICD-10-CM | POA: Diagnosis not present

## 2018-05-15 DIAGNOSIS — E7800 Pure hypercholesterolemia, unspecified: Secondary | ICD-10-CM | POA: Diagnosis not present

## 2018-05-15 DIAGNOSIS — J449 Chronic obstructive pulmonary disease, unspecified: Secondary | ICD-10-CM | POA: Diagnosis not present

## 2018-05-15 DIAGNOSIS — E118 Type 2 diabetes mellitus with unspecified complications: Secondary | ICD-10-CM | POA: Diagnosis not present

## 2018-05-15 DIAGNOSIS — K219 Gastro-esophageal reflux disease without esophagitis: Secondary | ICD-10-CM | POA: Diagnosis not present

## 2018-07-24 DIAGNOSIS — H524 Presbyopia: Secondary | ICD-10-CM | POA: Diagnosis not present

## 2018-08-20 DIAGNOSIS — C44319 Basal cell carcinoma of skin of other parts of face: Secondary | ICD-10-CM | POA: Diagnosis not present

## 2018-08-20 DIAGNOSIS — D485 Neoplasm of uncertain behavior of skin: Secondary | ICD-10-CM | POA: Diagnosis not present

## 2018-08-20 DIAGNOSIS — B078 Other viral warts: Secondary | ICD-10-CM | POA: Diagnosis not present

## 2018-08-20 DIAGNOSIS — C44219 Basal cell carcinoma of skin of left ear and external auricular canal: Secondary | ICD-10-CM | POA: Diagnosis not present

## 2018-08-20 DIAGNOSIS — L821 Other seborrheic keratosis: Secondary | ICD-10-CM | POA: Diagnosis not present

## 2018-08-20 DIAGNOSIS — L570 Actinic keratosis: Secondary | ICD-10-CM | POA: Diagnosis not present

## 2018-08-20 DIAGNOSIS — Z85828 Personal history of other malignant neoplasm of skin: Secondary | ICD-10-CM | POA: Diagnosis not present

## 2018-09-11 DIAGNOSIS — R350 Frequency of micturition: Secondary | ICD-10-CM | POA: Diagnosis not present

## 2018-09-11 DIAGNOSIS — R0609 Other forms of dyspnea: Secondary | ICD-10-CM | POA: Diagnosis not present

## 2018-09-11 DIAGNOSIS — J449 Chronic obstructive pulmonary disease, unspecified: Secondary | ICD-10-CM | POA: Diagnosis not present

## 2018-09-11 DIAGNOSIS — I10 Essential (primary) hypertension: Secondary | ICD-10-CM | POA: Diagnosis not present

## 2018-09-11 DIAGNOSIS — R739 Hyperglycemia, unspecified: Secondary | ICD-10-CM | POA: Diagnosis not present

## 2018-09-11 DIAGNOSIS — Z0000 Encounter for general adult medical examination without abnormal findings: Secondary | ICD-10-CM | POA: Diagnosis not present

## 2018-09-14 DIAGNOSIS — I10 Essential (primary) hypertension: Secondary | ICD-10-CM | POA: Diagnosis not present

## 2018-09-14 DIAGNOSIS — R0609 Other forms of dyspnea: Secondary | ICD-10-CM | POA: Diagnosis not present

## 2018-09-14 DIAGNOSIS — R0602 Shortness of breath: Secondary | ICD-10-CM | POA: Diagnosis not present

## 2018-10-09 DIAGNOSIS — I110 Hypertensive heart disease with heart failure: Secondary | ICD-10-CM | POA: Diagnosis not present

## 2018-10-09 DIAGNOSIS — Z0000 Encounter for general adult medical examination without abnormal findings: Secondary | ICD-10-CM | POA: Diagnosis not present

## 2018-10-09 DIAGNOSIS — R739 Hyperglycemia, unspecified: Secondary | ICD-10-CM | POA: Diagnosis not present

## 2018-10-09 DIAGNOSIS — I10 Essential (primary) hypertension: Secondary | ICD-10-CM | POA: Diagnosis not present

## 2018-10-09 DIAGNOSIS — Z125 Encounter for screening for malignant neoplasm of prostate: Secondary | ICD-10-CM | POA: Diagnosis not present

## 2018-10-09 LAB — LIPID PANEL
Cholesterol: 154 (ref 0–200)
HDL: 7 — AB (ref 35–70)
LDL Cholesterol: 95
LDl/HDL Ratio: 2
Triglycerides: 58 (ref 40–160)

## 2018-10-09 LAB — HEMOGLOBIN A1C: Hemoglobin A1C: 6.3

## 2018-10-09 LAB — PSA: PSA: 2.6

## 2018-10-09 LAB — BASIC METABOLIC PANEL
BUN: 17 (ref 4–21)
Creatinine: 1.1 (ref 0.6–1.3)

## 2018-10-16 DIAGNOSIS — E118 Type 2 diabetes mellitus with unspecified complications: Secondary | ICD-10-CM | POA: Diagnosis not present

## 2018-10-16 DIAGNOSIS — I10 Essential (primary) hypertension: Secondary | ICD-10-CM | POA: Diagnosis not present

## 2018-10-16 DIAGNOSIS — E7800 Pure hypercholesterolemia, unspecified: Secondary | ICD-10-CM | POA: Diagnosis not present

## 2018-10-16 DIAGNOSIS — Z7189 Other specified counseling: Secondary | ICD-10-CM | POA: Diagnosis not present

## 2018-10-16 DIAGNOSIS — J4550 Severe persistent asthma, uncomplicated: Secondary | ICD-10-CM | POA: Diagnosis not present

## 2018-11-13 DIAGNOSIS — I10 Essential (primary) hypertension: Secondary | ICD-10-CM | POA: Diagnosis not present

## 2018-11-13 DIAGNOSIS — R739 Hyperglycemia, unspecified: Secondary | ICD-10-CM | POA: Diagnosis not present

## 2018-11-13 DIAGNOSIS — E785 Hyperlipidemia, unspecified: Secondary | ICD-10-CM | POA: Diagnosis not present

## 2018-11-13 LAB — BASIC METABOLIC PANEL
BUN: 20 (ref 4–21)
CO2: 30 — AB (ref 13–22)
Chloride: 107 (ref 99–108)
Creatinine: 1.2 (ref 0.6–1.3)
Glucose: 116
Potassium: 5.3 (ref 3.4–5.3)
Sodium: 145 (ref 137–147)

## 2018-11-13 LAB — HEPATIC FUNCTION PANEL
ALT: 26 (ref 10–40)
AST: 13 — AB (ref 14–40)
Alkaline Phosphatase: 84 (ref 25–125)

## 2018-11-13 LAB — CBC AND DIFFERENTIAL
HCT: 42 (ref 41–53)
Hemoglobin: 14.1 (ref 13.5–17.5)
Platelets: 162 (ref 150–399)
WBC: 5.5

## 2018-11-13 LAB — COMPREHENSIVE METABOLIC PANEL
Calcium: 8.9 (ref 8.7–10.7)
GFR calc non Af Amer: 61.29

## 2018-11-18 DIAGNOSIS — E118 Type 2 diabetes mellitus with unspecified complications: Secondary | ICD-10-CM | POA: Diagnosis not present

## 2018-11-18 DIAGNOSIS — R350 Frequency of micturition: Secondary | ICD-10-CM | POA: Diagnosis not present

## 2018-11-18 DIAGNOSIS — I7300 Raynaud's syndrome without gangrene: Secondary | ICD-10-CM | POA: Diagnosis not present

## 2018-11-18 DIAGNOSIS — E785 Hyperlipidemia, unspecified: Secondary | ICD-10-CM | POA: Diagnosis not present

## 2018-11-18 DIAGNOSIS — I10 Essential (primary) hypertension: Secondary | ICD-10-CM | POA: Diagnosis not present

## 2019-01-05 DIAGNOSIS — J449 Chronic obstructive pulmonary disease, unspecified: Secondary | ICD-10-CM | POA: Diagnosis not present

## 2019-01-05 DIAGNOSIS — E7800 Pure hypercholesterolemia, unspecified: Secondary | ICD-10-CM | POA: Diagnosis not present

## 2019-01-05 DIAGNOSIS — R0609 Other forms of dyspnea: Secondary | ICD-10-CM | POA: Diagnosis not present

## 2019-01-05 DIAGNOSIS — E118 Type 2 diabetes mellitus with unspecified complications: Secondary | ICD-10-CM | POA: Diagnosis not present

## 2019-01-05 DIAGNOSIS — I10 Essential (primary) hypertension: Secondary | ICD-10-CM | POA: Diagnosis not present

## 2019-01-12 ENCOUNTER — Ambulatory Visit: Payer: Medicare PPO | Admitting: Cardiology

## 2019-01-25 NOTE — Progress Notes (Signed)
Cardiology Office Note:   Date:  01/26/2019  NAME:  Johnathan [name]    MRN: [id number] DOB:  March 28, 1932   PCP:  [name], MD  Cardiologist:  Evalina Field, MD   Referring MD: [name], MD   Chief Complaint  Patient presents with  . Shortness of Breath   History of Present Illness:   Johnathan [name] is a 86 y.o. male with a hx of hypertension, COPD, DM who is being seen today for the evaluation of dyspnea of exertion at the request of [name], MD.  He has a longstanding history of COPD.  He was evaluated by his primary care physician in December and noted to have worsening shortness of breath.  He reports he is able to walk up to 30 minutes 3 times per week at local parks.  He does get quite short of breath with activity and has to take frequent breaks.  Apparently this is getting worse over the past 1 month.  Blood pressure also has been noted to be elevated at his primary care physician office.  He denies any chest pain or palpitations with this.  He denies any lower extremity edema.  He is obviously tachypneic on my examination.  He does have elevated jugular venous distention noted as well.  Despite this, he is able to talk in full sentences.  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  He is never married and has no children.  He lives with a roommate in a townhouse.  He quit smoking some number of years ago.  He does drink alcohol daily.  He denies a history of heart attack or congestive heart failure.  No prior stroke.  Laboratory data from primary care physician office demonstrates total cholesterol 154, HDL 42, LDL 95, triglycerides 101, A1c 6.3, creatinine 1.11  Past Medical History: Past Medical History:  Diagnosis Date  . COPD (chronic obstructive pulmonary disease) ([place])   . Diabetes mellitus without complication ([place])   . Diverticulosis   . GERD (gastroesophageal reflux disease)    uses prilosec   . Hypertension   . Pneumonia   .  Raynaud's disease   . Shortness of breath dyspnea   . Sinusitis     Past Surgical History: Past Surgical History:  Procedure Laterality Date  . COLONOSCOPY  03/27/2005  . HERNIA REPAIR      Current Medications: Current Meds  Medication Sig  . albuterol (PROVENTIL HFA;VENTOLIN HFA) 108 (90 Base) MCG/ACT inhaler Inhale 2 puffs into the lungs every 4 (four) hours as needed for wheezing or shortness of breath (or coughing).  Marland Kitchen aspirin EC 81 MG tablet Take 81 mg by mouth daily.  . benzonatate (TESSALON) 100 MG capsule Take 1 capsule (100 mg total) by mouth 3 (three) times daily as needed for cough.  . budesonide-formoterol (SYMBICORT) 160-4.5 MCG/ACT inhaler Inhale 2 puffs into the lungs 2 (two) times daily. (Patient taking differently: Inhale 2 puffs into the lungs as needed (sob). )  . Cholecalciferol (VITAMIN D3) 125 MCG (5000 UT) CAPS Take 1 capsule by mouth daily.  . Cyanocobalamin (B-12) 1000 MCG CAPS Take 1 capsule by mouth daily.  . Flaxseed, Linseed, (FLAXSEED OIL) 1200 MG CAPS Take 1 capsule by mouth daily.  . Ginkgo Biloba EXTR Take 1 tablet by mouth daily.  . metFORMIN (GLUCOPHAGE) 500 MG tablet Take 1 tablet by mouth daily.  . Misc Natural Products (HIMALAYAN GOJI PO) Take 12 tablets  by mouth daily.  . Multiple Vitamin (MULTIVITAMIN WITH MINERALS) TABS tablet Take 1 tablet by mouth daily.  . Multiple Vitamins-Minerals (MULTIVITAMIN ADULT) TABS Take 1 tablet by mouth daily.  Marland Kitchen omeprazole (PRILOSEC) 20 MG capsule Take 20 mg by mouth 2 (two) times daily.  . pravastatin (PRAVACHOL) 40 MG tablet Take 40 mg by mouth daily.  . predniSONE (DELTASONE) 50 MG tablet Take 1 tablet (50 mg total) by mouth daily.  . primidone (MYSOLINE) 50 MG tablet Take 50 mg by mouth 2 (two) times daily.  Marland Kitchen terazosin (HYTRIN) 1 MG capsule Take 1 capsule by mouth 2 (two) times daily.  . Tetrahydrozoline HCl (VISINE OP) Apply 1 drop to eye daily.  Marland Kitchen tiotropium (SPIRIVA) 18 MCG inhalation capsule Place 1  capsule (18 mcg total) into inhaler and inhale daily.  . [DISCONTINUED] amLODipine (NORVASC) 2.5 MG tablet Take 1 tablet by mouth daily.     Allergies:    Patient has no known allergies.   Social History: Social History   Socioeconomic History  . Marital status: Single    Spouse name: Not on file  . Number of children: Not on file  . Years of education: Not on file  . Highest education level: Not on file  Occupational History  . Not on file  Tobacco Use  . Smoking status: Former Smoker    Packs/day: 1.00    Years: 45.00    Pack years: 45.00    Types: Cigarettes    Quit date: 01/21/1994    Years since quitting: 25.0  . Smokeless tobacco: Never Used  Substance and Sexual Activity  . Alcohol use: Yes    Comment: a drink daily with dinner  . Drug use: No  . Sexual activity: Not on file  Other Topics Concern  . Not on file  Social History Narrative  . Not on file   Social Determinants of Health   Financial Resource Strain:   . Difficulty of Paying Living Expenses: Not on file  Food Insecurity:   . Worried About Charity fundraiser in the Last Year: Not on file  . Ran Out of Food in the Last Year: Not on file  Transportation Needs:   . Lack of Transportation (Medical): Not on file  . Lack of Transportation (Non-Medical): Not on file  Physical Activity:   . Days of Exercise per Week: Not on file  . Minutes of Exercise per Session: Not on file  Stress:   . Feeling of Stress : Not on file  Social Connections:   . Frequency of Communication with Friends and Family: Not on file  . Frequency of Social Gatherings with Friends and Family: Not on file  . Attends Religious Services: Not on file  . Active Member of Clubs or Organizations: Not on file  . Attends Archivist Meetings: Not on file  . Marital Status: Not on file     Family History: The patient's family history includes Dementia in his brother and father; Diabetes in his brother; Heart attack in his  father.  ROS:   All other ROS reviewed and negative. Pertinent positives noted in the HPI.     EKGs/Labs/Other Studies Reviewed:   The following studies were personally reviewed by me today:  EKG:  EKG is ordered today.  The ekg ordered today demonstrates normal sinus rhythm, heart rate 79, no acute ST-T changes, no evidence of prior infarction, and was personally reviewed by me.   Recent Labs: No results found for  requested labs within last 8760 hours.   Recent Lipid Panel No results found for: CHOL, TRIG, HDL, CHOLHDL, VLDL, LDLCALC, LDLDIRECT  Physical Exam:   VS:  BP (!) 175/84   Pulse 79   Ht 5' 9.5" (1.765 m)   Wt 157 lb (71.2 kg)   SpO2 96%   BMI 22.85 kg/m    Wt Readings from Last 3 Encounters:  01/26/19 157 lb (71.2 kg)  05/15/17 150 lb (68 kg)  04/23/14 148 lb 5.9 oz (67.3 kg)    General: Well nourished, well developed, in no acute distress Heart: Atraumatic, normal size  Eyes: PEERLA, EOMI  Neck: Supple, JVD noted 12 to 15 cm of water, + HJR Endocrine: No thryomegaly Cardiac: Normal S1, S2; RRR; no murmurs, rubs, or gallops Lungs: Crackles at lung bases Abd: Soft, nontender, no hepatomegaly  Ext: Trace edema Musculoskeletal: No deformities, BUE and BLE strength normal and equal Skin: Warm and dry, no rashes   Neuro: Alert and oriented to person, place, time, and situation, CNII-XII grossly intact, no focal deficits  Psych: Normal mood and affect   ASSESSMENT:   Johnathan [name] is a 86 y.o. male who presents for the following: 1. SOB (shortness of breath) on exertion   2. Essential hypertension     PLAN:   1. SOB (shortness of breath) on exertion -He presents for the evaluation of worsening shortness of breath at the request of his primary care physician.  He surprisingly is able to complete 30 minutes of exercise.  I do suspect he is exaggerating his actual level of activity.  He has obvious jugular venous distention and evidence of likely congestive heart  failure examination.  His EKG is without acute ischemic changes or evidence of prior infarction. -We will start him on Lasix 20 mg daily.  I suspect his blood pressure is elevated because of volume overload.  We will try to treat this as an outpatient. -Lab work today will include BNP, TSH, BMP -We will obtain an echocardiogram and chest x-ray -We will plan to see him back in 2 weeks to see how he is doing.  If he has worsening shortness of breath or worsening volume overload he was instructed to present to the emergency room. -He ultimately will need a stress test, however we need to get fluid off of him first.  2. Essential hypertension -Suspect this is volume related.  Lasix as above.  Disposition: Return in about 2 weeks (around 02/09/2019).  Medication Adjustments/Labs and Tests Ordered: Current medicines are reviewed at length with the patient today.  Concerns regarding medicines are outlined above.  Orders Placed This Encounter  Procedures  . DG Chest 2 View  . Basic metabolic panel  . B Nat Peptide  . TSH  . EKG 12-Lead  . ECHOCARDIOGRAM COMPLETE   Meds ordered this encounter  Medications  . furosemide (LASIX) 20 MG tablet    Sig: Take 1 tablet (20 mg total) by mouth daily.    Dispense:  90 tablet    Refill:  3    Patient Instructions  Medication Instructions:  Your physician has recommended you make the following change in your medication:   START TAKING FUROSEMIDE (LASIX) 20 MG BY MOUTH DAILY  *If you need a refill on your cardiac medications before your next appointment, please call your pharmacy*  Lab Work: Your physician recommends that you return for lab work: this week at  [place] Address: [place]  [address], [place], [place] [address]  OR   on the day of your echocardiogram at LOCATION: [place] at [facility]: [place], [place], [place] [address]   B  NATRIURETIC PEPTIDE BASIC METABOLIC PANEL       THYROID STIMULATING HORMONE    If you have labs (blood work) drawn today and your tests are completely normal, you will receive your results only by: Marland Kitchen MyChart Message (if you have MyChart) OR . A paper copy in the mail If you have any lab test that is abnormal or we need to change your treatment, we will call you to review the results.  Testing/Procedures: Your physician has requested that you have an echocardiogram. Echocardiography is a painless test that uses sound waves to create images of your heart. It provides your doctor with information about the size and shape of your heart and how well your heart's chambers and valves are working. This procedure takes approximately one hour. There are no restrictions for this procedure. LOCATION: [place] at [facility]: [place], [place], [place] [address]  Your physician has requested that you have an A chest x-ray . This test takes a picture of the organs and structures inside the chest, including the heart, lungs, and blood vessels. This test can show several things, including, whether the heart is enlarges; whether fluid is building up in the lungs; and whether pacemaker / defibrillator leads are still in place.   Follow-Up: At [facility], you and your health needs are our priority.  As part of our continuing mission to provide you with exceptional heart care, we have created designated Provider Care Teams.  These Care Teams include your primary Cardiologist (physician) and Advanced Practice Providers (APPs -  Physician Assistants and Nurse Practitioners) who all work together to provide you with the care you need, when you need it.  Your next appointment:   2 week(s)  The format for your next appointment:   In Person  Provider:   Eleonore Chiquito, MD      Signed, Addison Naegeli. Audie Box, [place]  [address] [place], [place] [address] [phone]  01/26/2019 5:29 PM

## 2019-01-26 ENCOUNTER — Ambulatory Visit (INDEPENDENT_AMBULATORY_CARE_PROVIDER_SITE_OTHER): Payer: Medicare HMO | Admitting: Cardiovascular Disease

## 2019-01-26 ENCOUNTER — Encounter: Payer: Self-pay | Admitting: Cardiovascular Disease

## 2019-01-26 ENCOUNTER — Other Ambulatory Visit: Payer: Self-pay

## 2019-01-26 VITALS — BP 175/84 | HR 79 | Ht 69.5 in | Wt 157.0 lb

## 2019-01-26 DIAGNOSIS — I10 Essential (primary) hypertension: Secondary | ICD-10-CM

## 2019-01-26 DIAGNOSIS — R0602 Shortness of breath: Secondary | ICD-10-CM

## 2019-01-26 MED ORDER — FUROSEMIDE 20 MG PO TABS: 20.0000 mg | ORAL_TABLET | Freq: Every day | ORAL | 3 refills | Status: DC

## 2019-01-26 NOTE — Patient Instructions (Addendum)
Medication Instructions:  Your physician has recommended you make the following change in your medication:   START TAKING FUROSEMIDE (LASIX) 20 MG BY MOUTH DAILY  *If you need a refill on your cardiac medications before your next appointment, please call your pharmacy*  Lab Work: Your physician recommends that you return for lab work: this week at  [place] Address: [address], [place], [place] [address]  OR   on the day of your echocardiogram at LOCATION: [place] at [facility]: [place], [place], [place] [address]   B NATRIURETIC PEPTIDE BASIC METABOLIC PANEL       THYROID STIMULATING HORMONE    If you have labs (blood work) drawn today and your tests are completely normal, you will receive your results only by: Marland Kitchen MyChart Message (if you have MyChart) OR . A paper copy in the mail If you have any lab test that is abnormal or we need to change your treatment, we will call you to review the results.  Testing/Procedures: Your physician has requested that you have an echocardiogram. Echocardiography is a painless test that uses sound waves to create images of your heart. It provides your doctor with information about the size and shape of your heart and how well your heart's chambers and valves are working. This procedure takes approximately one hour. There are no restrictions for this procedure. LOCATION: [place] at [facility]: [place], [place], [place] [address]  Your physician has requested that you have an A chest x-ray . This test takes a picture of the organs and structures inside the chest, including the heart, lungs, and blood vessels. This test can show several things, including, whether the heart is enlarges; whether fluid is building up in the lungs; and whether pacemaker / defibrillator leads are still in place.   Follow-Up: At  [facility], you and your health needs are our priority.  As part of our continuing mission to provide you with exceptional heart care, we have created designated Provider Care Teams.  These Care Teams include your primary Cardiologist (physician) and Advanced Practice Providers (APPs -  Physician Assistants and Nurse Practitioners) who all work together to provide you with the care you need, when you need it.  Your next appointment:   2 week(s)  The format for your next appointment:   In Person  Provider:   Eleonore Chiquito, MD

## 2019-01-27 DIAGNOSIS — R0602 Shortness of breath: Secondary | ICD-10-CM | POA: Diagnosis not present

## 2019-01-28 LAB — BASIC METABOLIC PANEL
BUN/Creatinine Ratio: 18 (ref 10–24)
BUN: 15 mg/dL (ref 8–27)
CO2: 27 mmol/L (ref 20–29)
Calcium: 9.6 mg/dL (ref 8.6–10.2)
Chloride: 102 mmol/L (ref 96–106)
Creatinine, Ser: 0.85 mg/dL (ref 0.76–1.27)
GFR calc Af Amer: 92 mL/min/{1.73_m2} (ref >59)
GFR calc non Af Amer: 80 mL/min/{1.73_m2} (ref >59)
Glucose: 140 mg/dL — ABNORMAL HIGH (ref 65–99)
Potassium: 4.4 mmol/L (ref 3.5–5.2)
Sodium: 143 mmol/L (ref 134–144)

## 2019-01-28 LAB — TSH: TSH: 3.13 u[IU]/mL (ref 0.450–4.500)

## 2019-01-28 LAB — BRAIN NATRIURETIC PEPTIDE: BNP: 38.8 pg/mL (ref 0.0–100.0)

## 2019-02-02 DIAGNOSIS — I10 Essential (primary) hypertension: Secondary | ICD-10-CM | POA: Diagnosis not present

## 2019-02-02 DIAGNOSIS — K4090 Unilateral inguinal hernia, without obstruction or gangrene, not specified as recurrent: Secondary | ICD-10-CM | POA: Diagnosis not present

## 2019-02-02 DIAGNOSIS — R0609 Other forms of dyspnea: Secondary | ICD-10-CM | POA: Diagnosis not present

## 2019-02-02 DIAGNOSIS — J449 Chronic obstructive pulmonary disease, unspecified: Secondary | ICD-10-CM | POA: Diagnosis not present

## 2019-02-04 ENCOUNTER — Other Ambulatory Visit: Payer: Self-pay

## 2019-02-04 ENCOUNTER — Ambulatory Visit (HOSPITAL_COMMUNITY): Payer: Medicare HMO | Attending: Cardiology

## 2019-02-04 DIAGNOSIS — R0602 Shortness of breath: Secondary | ICD-10-CM | POA: Diagnosis not present

## 2019-02-04 DIAGNOSIS — J449 Chronic obstructive pulmonary disease, unspecified: Secondary | ICD-10-CM | POA: Diagnosis not present

## 2019-02-05 DIAGNOSIS — J069 Acute upper respiratory infection, unspecified: Secondary | ICD-10-CM | POA: Diagnosis not present

## 2019-02-07 NOTE — Progress Notes (Signed)
Cardiology Office Note:   Date:  02/09/2019  NAME:  Johnathan [name]    MRN: [id number] DOB:  12-21-1932   PCP:  [name], MD  Cardiologist:  Evalina Field, MD   Referring MD: [name], MD   Chief Complaint  Patient presents with  . Shortness of Breath   History of Present Illness:   Johnathan [name] is a 86 y.o. male with a hx of hypertension, COPD, diabetes who presents for follow-up of dyspnea exertion.  Recent lab work with BNP 38, TSH 3.1.  Echocardiogram with left ventricular ejection fraction 60% and indeterminate diastolic parameters.  He had elevated jugular venous distention on examination 2 weeks ago and started on Lasix.  His echocardiogram and BNP are inconsistent with a diagnosis of congestive heart failure.  We will stop his Lasix now.  He reports that he is still maintains a quite high level of activity.  He can walk up to 30 minutes at local parks but does get short of breath and take breaks.  He reports symptoms are quite stable.  He reports walking this level of activity 3-4 times per week.  He endorses no chest pain with it.  He does have a longstanding history of COPD which I suspect is the etiology of the elevated neck veins.  He has noticed no real difference in his breathing on Lasix.  He quit smoking some years ago but does have COPD that is treated.  He did obtain a chest x-ray at his primary care physician office but I do not have the results of that.  His primary care physician is concerned about underlying obstructive CAD.  We will proceed with a stress test in the next few days.  Laboratory data from primary care physician office demonstrates total cholesterol 154, HDL 42, LDL 95, triglycerides 101, A1c 6.3, creatinine 1.11  Past Medical History: Past Medical History:  Diagnosis Date  . COPD (chronic obstructive pulmonary disease) ([place])   . Diabetes mellitus without complication ([place])   . Diverticulosis   . GERD (gastroesophageal reflux disease)    uses  prilosec   . Hypertension   . Pneumonia   . Raynaud's disease   . Shortness of breath dyspnea   . Sinusitis     Past Surgical History: Past Surgical History:  Procedure Laterality Date  . COLONOSCOPY  03/27/2005  . HERNIA REPAIR      Current Medications: Current Meds  Medication Sig  . albuterol (PROVENTIL HFA;VENTOLIN HFA) 108 (90 Base) MCG/ACT inhaler Inhale 2 puffs into the lungs every 4 (four) hours as needed for wheezing or shortness of breath (or coughing).  Marland Kitchen amLODipine (NORVASC) 5 MG tablet daily.  Marland Kitchen aspirin EC 81 MG tablet Take 81 mg by mouth daily.  . benzonatate (TESSALON) 100 MG capsule Take 1 capsule (100 mg total) by mouth 3 (three) times daily as needed for cough.  . budesonide-formoterol (SYMBICORT) 160-4.5 MCG/ACT inhaler Inhale 2 puffs into the lungs 2 (two) times daily.  . Cholecalciferol (VITAMIN D3) 125 MCG (5000 UT) CAPS Take 1 capsule by mouth daily.  . Cyanocobalamin (B-12) 1000 MCG CAPS Take 1 capsule by mouth daily.  . Flaxseed, Linseed, (FLAXSEED OIL) 1200 MG CAPS Take 1 capsule by mouth daily.  . Ginkgo Biloba EXTR Take 1 tablet by mouth daily.  . metFORMIN (GLUCOPHAGE) 500 MG tablet Take 1 tablet by mouth daily.  . Misc Natural Products (HIMALAYAN GOJI PO) Take 12 tablets by mouth daily.  . Multiple Vitamin (MULTIVITAMIN  WITH MINERALS) TABS tablet Take 1 tablet by mouth daily.  . Multiple Vitamins-Minerals (MULTIVITAMIN ADULT) TABS Take 1 tablet by mouth daily.  Marland Kitchen omeprazole (PRILOSEC) 20 MG capsule Take 20 mg by mouth 2 (two) times daily.  . pravastatin (PRAVACHOL) 40 MG tablet Take 40 mg by mouth daily.  . predniSONE (DELTASONE) 50 MG tablet Take 1 tablet (50 mg total) by mouth daily.  . primidone (MYSOLINE) 50 MG tablet Take 50 mg by mouth 2 (two) times daily.  Marland Kitchen terazosin (HYTRIN) 1 MG capsule Take 1 capsule by mouth 2 (two) times daily.  . Tetrahydrozoline HCl (VISINE OP) Apply 1 drop to eye daily.  Marland Kitchen tiotropium (SPIRIVA) 18 MCG inhalation capsule  Place 1 capsule (18 mcg total) into inhaler and inhale daily.  . [DISCONTINUED] diltiazem (CARDIZEM) 30 MG tablet Take 1 tablet (30 mg total) by mouth 2 (two) times daily.  . [DISCONTINUED] furosemide (LASIX) 20 MG tablet Take 1 tablet (20 mg total) by mouth daily.     Allergies:    Patient has no known allergies.   Social History: Social History   Socioeconomic History  . Marital status: Single    Spouse name: Not on file  . Number of children: Not on file  . Years of education: Not on file  . Highest education level: Not on file  Occupational History  . Not on file  Tobacco Use  . Smoking status: Former Smoker    Packs/day: 1.00    Years: 45.00    Pack years: 45.00    Types: Cigarettes    Quit date: 01/21/1994    Years since quitting: 25.0  . Smokeless tobacco: Never Used  Substance and Sexual Activity  . Alcohol use: Yes    Comment: a drink daily with dinner  . Drug use: No  . Sexual activity: Not on file  Other Topics Concern  . Not on file  Social History Narrative  . Not on file   Social Determinants of Health   Financial Resource Strain:   . Difficulty of Paying Living Expenses: Not on file  Food Insecurity:   . Worried About Charity fundraiser in the Last Year: Not on file  . Ran Out of Food in the Last Year: Not on file  Transportation Needs:   . Lack of Transportation (Medical): Not on file  . Lack of Transportation (Non-Medical): Not on file  Physical Activity:   . Days of Exercise per Week: Not on file  . Minutes of Exercise per Session: Not on file  Stress:   . Feeling of Stress : Not on file  Social Connections:   . Frequency of Communication with Friends and Family: Not on file  . Frequency of Social Gatherings with Friends and Family: Not on file  . Attends Religious Services: Not on file  . Active Member of Clubs or Organizations: Not on file  . Attends Archivist Meetings: Not on file  . Marital Status: Not on file    Family  History: The patient's family history includes Dementia in his brother and father; Diabetes in his brother; Heart attack in his father.  ROS:   All other ROS reviewed and negative. Pertinent positives noted in the HPI.     EKGs/Labs/Other Studies Reviewed:   The following studies were personally reviewed by me today:  TTE 02/04/2019  1. Left ventricular ejection fraction, by visual estimation, is 60 to 65%. The left ventricle has normal function. There is no left ventricular hypertrophy.  2. Left ventricular diastolic parameters are indeterminate.  3. The left ventricle has no regional wall motion abnormalities.  4. Global right ventricle has normal systolic function.The right ventricular size is normal. No increase in right ventricular wall thickness.  5. Left atrial size was normal.  6. Right atrial size was normal.  7. Moderate to severe mitral annular calcification.  8. The mitral valve is normal in structure. Trivial mitral valve regurgitation.  9. The tricuspid valve is normal in structure. 10. Aortic valve regurgitation is mild to moderate. 11. The aortic valve is tricuspid. Aortic valve regurgitation is mild to moderate. 12. The pulmonic valve was grossly normal. Pulmonic valve regurgitation is not visualized. 13. The inferior vena cava is normal in size with greater than 50% respiratory variability, suggesting right atrial pressure of 3 mmHg.  Recent Labs: 01/27/2019: BNP 38.8; BUN 15; Creatinine, Ser 0.85; Potassium 4.4; Sodium 143; TSH 3.130   Recent Lipid Panel No results found for: CHOL, TRIG, HDL, CHOLHDL, VLDL, LDLCALC, LDLDIRECT  Physical Exam:   VS:  BP 132/65   Pulse 91   Ht 5' 9.5" (1.765 m)   Wt 156 lb 9.6 oz (71 kg)   SpO2 97%   BMI 22.79 kg/m    Wt Readings from Last 3 Encounters:  02/09/19 156 lb 9.6 oz (71 kg)  01/26/19 157 lb (71.2 kg)  05/15/17 150 lb (68 kg)    General: Well nourished, well developed, in no acute distress Heart: Atraumatic, normal  size  Eyes: PEERLA, EOMI  Neck: Supple, distended neck veins Endocrine: No thryomegaly Cardiac: Normal S1, S2; RRR; no murmurs, rubs, or gallops Lungs: Diminished breath sounds bilaterally Abd: Soft, nontender, no hepatomegaly  Ext: No edema, pulses 2+ Musculoskeletal: No deformities, BUE and BLE strength normal and equal Skin: Warm and dry, no rashes   Neuro: Alert and oriented to person, place, time, and situation, CNII-XII grossly intact, no focal deficits  Psych: Normal mood and affect   ASSESSMENT:   Johnathan [name] is a 86 y.o. male who presents for the following: 1. SOB (shortness of breath) on exertion   2. Essential hypertension     PLAN:   1. SOB (shortness of breath) on exertion -Recent BNP normal.  Echocardiogram with preserved left ventricular function and no evidence of significant diastolic dysfunction.  He has noticed no difference with Lasix.  I suspect some of his jugular venous distention is related to his COPD.  We will stop his Lasix now. -Given that he does get short of breath and has significant CVD risk factors including hypertension as well as significant tobacco abuse and COPD, we will pursue a Lexiscan nuclear medicine stress test.  He has no active asthma or reactive airway disease so I think he should be okay to pursue a Lexiscan stress test. -He will follow-up in 1 month after the above testing.  If the above is negative I suspect his symptoms are just related to COPD.  2. Essential hypertension -Continue current blood pressure medications   Disposition: Return in about 1 month (around 03/12/2019).  Medication Adjustments/Labs and Tests Ordered: Current medicines are reviewed at length with the patient today.  Concerns regarding medicines are outlined above.  Orders Placed This Encounter  Procedures  . MYOCARDIAL PERFUSION IMAGING   No orders of the defined types were placed in this encounter.   Patient Instructions  Medication Instructions:  The  current medical regimen is effective;  continue present plan and medications.  *If you need a refill  on your cardiac medications before your next appointment, please call your pharmacy*  Testing/Procedures: Your physician has requested that you have a lexiscan myoview. A cardiac stress test is a cardiological test that measures the heart's ability to respond to external stress in a controlled clinical environment. The stress response is induced by intravenous pharmacological stimulation.   Follow-Up: At [facility], you and your health needs are our priority.  As part of our continuing mission to provide you with exceptional heart care, we have created designated Provider Care Teams.  These Care Teams include your primary Cardiologist (physician) and Advanced Practice Providers (APPs -  Physician Assistants and Nurse Practitioners) who all work together to provide you with the care you need, when you need it.  Your next appointment:   1 month(s)  The format for your next appointment:   In Person  Provider:   Eleonore Chiquito, MD      Signed, Addison Naegeli. Audie Box, [place]  [address] [place], [place] [address] [phone]  02/09/2019 12:07 PM

## 2019-02-09 ENCOUNTER — Encounter: Payer: Self-pay | Admitting: Cardiovascular Disease

## 2019-02-09 ENCOUNTER — Other Ambulatory Visit: Payer: Self-pay

## 2019-02-09 ENCOUNTER — Ambulatory Visit (INDEPENDENT_AMBULATORY_CARE_PROVIDER_SITE_OTHER): Payer: Medicare HMO | Admitting: Cardiovascular Disease

## 2019-02-09 VITALS — BP 132/65 | HR 91 | Ht 69.5 in | Wt 156.6 lb

## 2019-02-09 DIAGNOSIS — R0602 Shortness of breath: Secondary | ICD-10-CM

## 2019-02-09 DIAGNOSIS — I10 Essential (primary) hypertension: Secondary | ICD-10-CM

## 2019-02-09 NOTE — Patient Instructions (Signed)
Medication Instructions:  The current medical regimen is effective;  continue present plan and medications.  *If you need a refill on your cardiac medications before your next appointment, please call your pharmacy*  Testing/Procedures: Your physician has requested that you have a lexiscan myoview. A cardiac stress test is a cardiological test that measures the heart's ability to respond to external stress in a controlled clinical environment. The stress response is induced by intravenous pharmacological stimulation.  Follow-Up: At [facility], you and your health needs are our priority.  As part of our continuing mission to provide you with exceptional heart care, we have created designated Provider Care Teams.  These Care Teams include your primary Cardiologist (physician) and Advanced Practice Providers (APPs -  Physician Assistants and Nurse Practitioners) who all work together to provide you with the care you need, when you need it.  Your next appointment:   1 month(s)  The format for your next appointment:   In Person  Provider:   Lyons O'Neal, MD    

## 2019-02-10 ENCOUNTER — Telehealth: Payer: Self-pay | Admitting: Cardiovascular Disease

## 2019-02-10 NOTE — Telephone Encounter (Signed)
Discussed recent CXR with Johnathan [name]. Shows COPD and no CHF.   [place] T. Audie Box, [place]  [address] [place], [place] [address] [phone]  12:15 PM

## 2019-02-11 ENCOUNTER — Telehealth (HOSPITAL_COMMUNITY): Payer: Self-pay

## 2019-02-11 NOTE — Telephone Encounter (Signed)
Encounter complete. 

## 2019-02-12 ENCOUNTER — Inpatient Hospital Stay (HOSPITAL_COMMUNITY): Admission: RE | Admit: 2019-02-12 | Payer: Medicare HMO | Source: Ambulatory Visit

## 2019-02-16 ENCOUNTER — Other Ambulatory Visit: Payer: Self-pay

## 2019-02-16 ENCOUNTER — Ambulatory Visit (HOSPITAL_COMMUNITY)
Admission: RE | Admit: 2019-02-16 | Discharge: 2019-02-16 | Disposition: A | Discharge status: Home / Self Care | Payer: Medicare HMO | Source: Ambulatory Visit | Attending: Cardiovascular Disease | Admitting: Cardiovascular Disease

## 2019-02-16 DIAGNOSIS — R0602 Shortness of breath: Secondary | ICD-10-CM

## 2019-02-16 LAB — MYOCARDIAL PERFUSION IMAGING
LV dias vol: 90 mL (ref 62–150)
LV sys vol: 45 mL
Peak HR: 84 {beats}/min
Rest HR: 65 {beats}/min
SDS: 3
SRS: 3
SSS: 6
TID: 1.03

## 2019-02-16 MED ORDER — TECHNETIUM TC 99M TETROFOSMIN IV KIT
31.0000 | PACK | Freq: Once | INTRAVENOUS | Status: AC | PRN
  Administered 2019-02-16: 31 via INTRAVENOUS
  Filled 2019-02-16: qty 31

## 2019-02-16 MED ORDER — REGADENOSON 0.4 MG/5ML IV SOLN
0.4000 mg | Freq: Once | INTRAVENOUS | Status: AC
  Administered 2019-02-16: 11:00:00 0.4 mg via INTRAVENOUS

## 2019-02-16 MED ORDER — TECHNETIUM TC 99M TETROFOSMIN IV KIT
10.1000 | PACK | Freq: Once | INTRAVENOUS | Status: AC | PRN
  Administered 2019-02-16: 10.1 via INTRAVENOUS
  Filled 2019-02-16: qty 11

## 2019-03-01 ENCOUNTER — Ambulatory Visit: Payer: Self-pay | Admitting: Surgery

## 2019-03-01 DIAGNOSIS — K4091 Unilateral inguinal hernia, without obstruction or gangrene, recurrent: Secondary | ICD-10-CM | POA: Diagnosis not present

## 2019-03-01 DIAGNOSIS — E119 Type 2 diabetes mellitus without complications: Secondary | ICD-10-CM | POA: Insufficient documentation

## 2019-03-01 NOTE — H&P (Signed)
Johnathan [name] Documented: 03/01/2019 2:46 PM Location: [facility] Patient #: [id number] DOB: 1932/11/27 Single / Language: Johnathan [name] / Race: White Male  History of Present Illness Johnathan Hector MD; 03/01/2019 3:46 PM) The patient is a 86 year old male who presents with an inguinal hernia. Note for "Inguinal hernia": ` ` ` Patient sent for surgical consultation at the request of Dr [name]  Chief Complaint: Worsening right inguinal hernia. ` ` The patient is an active male. He comes by himself. History of mild COPD stable on inhalers. He needed prednisone taper last year but nothing since. Borderline diabetes stable and oral hypoglycemic. He is noticed swelling in his right groin for many years. Its Larger. Becomes More Comfortable Now. Usually Walks in the [facility] for At [employer] 20-30 Minutes without Much Difficulty. He'll Works Engineer, civil (consulting) with Running errands/Driving. Likes to Stay Active. Because the Right Groin Hernia Became Larger and More Symptomatic, so His Primary Care Physician Offered Consultation. She Does Not Smoke. He Recalls Having an Open Right Inguinal Hernia Repair Done Many Decades Ago When He Lived in [place]. He's Not Had Any Cardiac Events. He does not smoke. Some questionable diagnosis of rheumatoid arthritis but not immunosuppressed. He denies any history of prostate issues. No nocturia. No difficulty with starting her maintaining urinary stream. No issues with constipation. Usually moves his bowels every morning.    (Review of systems as stated in this history (HPI) or in the review of systems. Otherwise all other 12 point ROS are negative) ` ` `  This patient encounter took 45 minutes today to perform the following: obtain history, perform exam, review outside records, interpret tests & imaging, counsel the patient on their diagnosis; and, document this encounter, including findings & plan in the electronic  health record (EHR).   Past Surgical History Johnathan [name], [place]; 03/01/2019 2:46 PM) Anal Fissure Repair Colon Polyp Removal - Colonoscopy  Diagnostic Studies History Johnathan [name], CMA; 03/01/2019 2:46 PM) Colonoscopy 5-10 years ago  Allergies Johnathan [name], [place]; 03/01/2019 2:48 PM) No Known Allergies [03/01/2019]:  Medication History (Johnathan [name], CMA; 03/01/2019 2:52 PM) Proventil HFA (108 (90 Base)MCG/ACT Aerosol Soln, Inhalation) Active. Norvasc (5MG  Tablet, Oral) Active. Aspirin (81MG  Tablet, Oral) Active. Symbicort (160-4.5MCG/ACT Aerosol, Inhalation) Active. Vitamin D3 (125 MCG(5000 UT) Tablet, Oral) Active. Vitamin B-12 (1000MCG Tablet, Oral) Active. Flaxseed Oil (1200MG  Capsule, Oral) Active. metFORMIN HCl (500MG  Tablet, Oral) Active. Multivitamins/Minerals (Oral) Active. Omeprazole (20MG  Capsule DR, Oral) Active. Pravastatin Sodium (40MG  Tablet, Oral) Active. predniSONE (50MG  Tablet, Oral) Active. Mysoline (50MG  Tablet, Oral) Active. Hytrin (1MG  Capsule, Oral) Active. Visine (0.05% Solution, Ophthalmic) Active. Spiriva HandiHaler (18MCG Capsule, Inhalation) Active. Medications Reconciled  Social History Johnathan [name], CMA; 03/01/2019 2:46 PM) Alcohol use Moderate alcohol use. Caffeine use Coffee. Tobacco use Former smoker.  Family History (Johnathan [name], [place]; 03/01/2019 2:46 PM) Heart disease in male family member before age 86 Thyroid problems Mother.  Other Problems Johnathan [name], CMA; 03/01/2019 2:46 PM) Arthritis Back Pain Chronic Obstructive Lung Disease Gastroesophageal Reflux Disease Hemorrhoids High blood pressure Hypercholesterolemia     Review of Systems (Johnathan [name] CMA; 03/01/2019 2:46 PM) General Not Present- Appetite Loss, Chills, Fatigue, Fever, Night Sweats, Weight Gain and Weight Loss. Skin Present- Dryness. Not Present- Change in Wart/Mole, Hives, Jaundice, New Lesions, Non-Healing Wounds, Rash and  Ulcer. HEENT Present- Seasonal Allergies. Not Present- Earache, Hearing Loss, Hoarseness, Nose Bleed, Oral Ulcers, Ringing in the Ears, Sinus Pain, Sore Throat, Visual Disturbances, Wears glasses/contact lenses and Yellow  Eyes. Gastrointestinal Present- Difficulty Swallowing. Not Present- Abdominal Pain, Bloating, Bloody Stool, Change in Bowel Habits, Chronic diarrhea, Constipation, Excessive gas, Gets full quickly at meals, Hemorrhoids, Indigestion, Nausea, Rectal Pain and Vomiting. Neurological Present- Tremor. Not Present- Decreased Memory, Fainting, Headaches, Numbness, Seizures, Tingling, Trouble walking and Weakness. Psychiatric Not Present- Anxiety, Bipolar, Change in Sleep Pattern, Depression, Fearful and Frequent crying.  Vitals (Johnathan [name] CMA; 03/01/2019 2:48 PM) 03/01/2019 2:47 PM Weight: 159.13 lb Height: 70in Body Surface Area: 1.89 m Body Mass Index: 22.83 kg/m  Temp.: 98.51F  Pulse: 81 (Regular)  P.OX: 93% (Room air) BP: 156/88 (Sitting, Left Arm, Standard)        Physical Exam Johnathan Hector MD; 03/01/2019 3:22 PM)  General Mental Status-Alert. General Appearance-Not in acute distress, Not Sickly. Orientation-Oriented X3. Hydration-Well hydrated. Voice-Normal.  Integumentary Global Assessment Upon inspection and palpation of skin surfaces of the - Axillae: non-tender, no inflammation or ulceration, no drainage. and Distribution of scalp and body hair is normal. General Characteristics Temperature - normal warmth is noted.  Head and Neck Head-normocephalic, atraumatic with no lesions or palpable masses. Face Global Assessment - atraumatic, no absence of expression. Neck Global Assessment - no abnormal movements, no bruit auscultated on the right, no bruit auscultated on the left, no decreased range of motion, non-tender. Trachea-midline. Thyroid Gland Characteristics - non-tender.  Eye Eyeball - Left-Extraocular movements  intact, No Nystagmus - Left. Eyeball - Right-Extraocular movements intact, No Nystagmus - Right. Cornea - Left-No Hazy - Left. Cornea - Right-No Hazy - Right. Sclera/Conjunctiva - Left-No scleral icterus, No Discharge - Left. Sclera/Conjunctiva - Right-No scleral icterus, No Discharge - Right. Pupil - Left-Direct reaction to light normal. Pupil - Right-Direct reaction to light normal.  ENMT Ears Pinna - Left - no drainage observed, no generalized tenderness observed. Pinna - Right - no drainage observed, no generalized tenderness observed. Nose and Sinuses External Inspection of the Nose - no destructive lesion observed. Inspection of the nares - Left - quiet respiration. Inspection of the nares - Right - quiet respiration. Mouth and Throat Lips - Upper Lip - no fissures observed, no pallor noted. Lower Lip - no fissures observed, no pallor noted. Nasopharynx - no discharge present. Oral Cavity/Oropharynx - Tongue - no dryness observed. Oral Mucosa - no cyanosis observed. Hypopharynx - no evidence of airway distress observed.  Chest and Lung Exam Inspection Movements - Normal and Symmetrical. Accessory muscles - No use of accessory muscles in breathing. Palpation Palpation of the chest reveals - Non-tender. Auscultation Breath sounds - Normal and Clear.  Cardiovascular Auscultation Rhythm - Regular. Murmurs & Other Heart Sounds - Auscultation of the heart reveals - No Murmurs and No Systolic Clicks.  Abdomen Inspection Inspection of the abdomen reveals - No Visible peristalsis and No Abnormal pulsations. Umbilicus - No Bleeding, No Urine drainage. Palpation/Percussion Palpation and Percussion of the abdomen reveal - Soft, Non Tender, No Rebound tenderness, No Rigidity (guarding) and No Cutaneous hyperesthesia. Note: Abdomen soft. Moderate supraumbilical diastases recti. Thin abdominal wall. Nontender. Not distended. No umbilical hernia. No guarding.  Male  Genitourinary Sexual Maturity Tanner 5 - Adult hair pattern and Adult penile size and shape. Note: Obvious right inguinal hernia to groin only. Reducible. Small impulse on left side suspicious for indirect inguinal hernia.  Uncircumcised male. Testes and epididymides within normal limits.  Peripheral Vascular Upper Extremity Inspection - Left - No Cyanotic nailbeds - Left, Not Ischemic. Inspection - Right - No Cyanotic nailbeds - Right, Not Ischemic.  Neurologic Neurologic  evaluation reveals -normal attention span and ability to concentrate, able to name objects and repeat phrases. Appropriate fund of knowledge , normal sensation and normal coordination. Mental Status Affect - not angry, not paranoid. Cranial Nerves-Normal Bilaterally. Gait-Normal.  Neuropsychiatric Mental status exam performed with findings of-able to articulate well with normal speech/language, rate, volume and coherence, thought content normal with ability to perform basic computations and apply abstract reasoning and no evidence of hallucinations, delusions, obsessions or homicidal/suicidal ideation.  Musculoskeletal Global Assessment Spine, Ribs and Pelvis - no instability, subluxation or laxity. Right Upper Extremity - no instability, subluxation or laxity.  Lymphatic Head & Neck  General Head & Neck Lymphatics: Bilateral - Description - No Localized lymphadenopathy. Axillary  General Axillary Region: Bilateral - Description - No Localized lymphadenopathy. Femoral & Inguinal  Generalized Femoral & Inguinal Lymphatics: Left - Description - No Localized lymphadenopathy. Right - Description - No Localized lymphadenopathy.    Assessment & Plan Johnathan Hector MD; 03/01/2019 3:43 PM)  RECURRENT RIGHT INGUINAL HERNIA (K40.91) Impression: Obvious right inguinal hernia with prior open repair decades ago. I suspect he might have a contralateral left inguinal hernia as well. Because he is quite active and  independent with many more years of life likely, and increasing symptoms from his hernia; I think he would benefit from laparoscopic exploration and repair of hernias found. He is interested in proceeding.  Okay to continue his low-dose aspirin.  He denies any history of prostatism & stable on Hytrin. Hopefully a low chance he will have urinary retention issues with his advanced age.  Does have a history of COPD with occasional flares but is not needed oral steroids in over a year.   PREOP - ING HERNIA - ENCOUNTER FOR PREOPERATIVE EXAMINATION FOR GENERAL SURGICAL PROCEDURE (Z01.818)  Current Plans You are being scheduled for surgery- Our schedulers will call you.  You should hear from our office's scheduling department within 5 working days about the location, date, and time of surgery. We try to make accommodations for patient's preferences in scheduling surgery, but sometimes the OR schedule or the surgeon's schedule prevents [place] from making those accommodations.  If you have not heard from our office [phone]) in 5 working days, call the office and ask for your surgeon's nurse.  If you have other questions about your diagnosis, plan, or surgery, call the office and ask for your surgeon's nurse.  Written instructions provided The anatomy & physiology of the abdominal wall and pelvic floor was discussed. The pathophysiology of hernias in the inguinal and pelvic region was discussed. Natural history risks such as progressive enlargement, pain, incarceration, and strangulation was discussed. Contributors to complications such as smoking, obesity, diabetes, prior surgery, etc were discussed.  I feel the risks of no intervention will lead to serious problems that outweigh the operative risks; therefore, I recommended surgery to reduce and repair the hernia. I explained laparoscopic techniques with possible need for an open approach. I noted usual use of mesh to patch and/or buttress  hernia repair  Risks such as bleeding, infection, abscess, need for further treatment, heart attack, death, and other risks were discussed. I noted a good likelihood this will help address the problem. Goals of post-operative recovery were discussed as well. Possibility that this will not correct all symptoms was explained. I stressed the importance of low-impact activity, aggressive pain control, avoiding constipation, & not pushing through pain to minimize risk of post-operative chronic pain or injury. Possibility of reherniation was discussed. We will work to  minimize complications.  An educational handout further explaining the pathology & treatment options was given as well. Questions were answered. The patient expresses understanding & wishes to proceed with surgery.  Pt Education - Pamphlet Given - Laparoscopic Hernia Repair: discussed with patient and provided information. Pt Education - [facility] Pain Control (Berkleigh Beckles) Pt Education - [facility] Hernia Post-Op HCI (Zariyah Stephens): discussed with patient and provided information. Pt Education - [facility] Mesh education: discussed with patient and provided information.  Johnathan Hector, MD, FACS, MASCRS Gastrointestinal and Minimally Invasive Surgery  [facility] [address] [place], [place] [address] [phone] Main / Paging [phone] Fax

## 2019-03-10 NOTE — Progress Notes (Signed)
Virtual Visit via Telephone Note   This visit type was conducted due to national recommendations for restrictions regarding the COVID-19 Pandemic (e.g. social distancing) in an effort to limit this patient's exposure and mitigate transmission in our community.  Due to his co-morbid illnesses, this patient is at least at moderate risk for complications without adequate follow up.  This format is felt to be most appropriate for this patient at this time.  The patient did not have access to video technology/had technical difficulties with video requiring transitioning to audio format only (telephone).  All issues noted in this document were discussed and addressed.  No physical exam could be performed with this format.  Please refer to the patient's chart for his  consent to telehealth for [facility].   Date:  03/11/2019   ID:  Johnathan [name], DOB 01/16/1933, MRN [id number]  Patient Location: Home Provider Location: Home  PCP:  Johnathan Gravel, MD  Cardiologist:  Evalina Field, MD   Evaluation Performed:  Follow-Up Visit  Chief Complaint:  Shortness of breath   History of Present Illness:    Johnathan [name] is a 86 y.o. male with COPD, HTN who presents for follow-up of SOB. Normal echo. Normal BNP. Normal cardiac stress. BNP 38. Laboratory data from primary care physician office demonstrates total cholesterol 154, HDL 42, LDL 95, triglycerides 101, A1c 6.3, creatinine 1.11. Reports he is doing well and without issues. He is pleased to know that his heart is working well. Denies CP, SOB, palpitations today.   The patient does not have symptoms concerning for COVID-19 infection (fever, chills, cough, or new shortness of breath).    Past Medical History:  Diagnosis Date  . COPD (chronic obstructive pulmonary disease) ([place])   . Diabetes mellitus without complication ([place])   . Diverticulosis   . GERD (gastroesophageal reflux disease)    uses prilosec   . Hypertension   . Pneumonia   .  Raynaud's disease   . Shortness of breath dyspnea   . Sinusitis    Past Surgical History:  Procedure Laterality Date  . COLONOSCOPY  03/27/2005  . HERNIA REPAIR       Current Meds  Medication Sig  . albuterol (PROVENTIL HFA;VENTOLIN HFA) 108 (90 Base) MCG/ACT inhaler Inhale 2 puffs into the lungs every 4 (four) hours as needed for wheezing or shortness of breath (or coughing).  Marland Kitchen amLODipine (NORVASC) 5 MG tablet daily.  Marland Kitchen aspirin EC 81 MG tablet Take 81 mg by mouth daily.  . benzonatate (TESSALON) 100 MG capsule Take 1 capsule (100 mg total) by mouth 3 (three) times daily as needed for cough.  . budesonide-formoterol (SYMBICORT) 160-4.5 MCG/ACT inhaler Inhale 2 puffs into the lungs 2 (two) times daily.  . Cholecalciferol (VITAMIN D3) 125 MCG (5000 UT) CAPS Take 1 capsule by mouth daily.  . Cyanocobalamin (B-12) 1000 MCG CAPS Take 1 capsule by mouth daily.  . Flaxseed, Linseed, (FLAXSEED OIL) 1200 MG CAPS Take 1 capsule by mouth daily.  . Ginkgo Biloba EXTR Take 1 tablet by mouth daily.  . metFORMIN (GLUCOPHAGE) 500 MG tablet Take 1 tablet by mouth daily.  . Misc Natural Products (HIMALAYAN GOJI PO) Take 12 tablets by mouth daily.  . Multiple Vitamin (MULTIVITAMIN WITH MINERALS) TABS tablet Take 1 tablet by mouth daily.  . Multiple Vitamins-Minerals (MULTIVITAMIN ADULT) TABS Take 1 tablet by mouth daily.  Marland Kitchen omeprazole (PRILOSEC) 20 MG capsule Take 20 mg by mouth 2 (two) times daily.  . pravastatin (  PRAVACHOL) 40 MG tablet Take 40 mg by mouth daily.  . predniSONE (DELTASONE) 50 MG tablet Take 1 tablet (50 mg total) by mouth daily.  . primidone (MYSOLINE) 50 MG tablet Take 50 mg by mouth 2 (two) times daily.  Marland Kitchen terazosin (HYTRIN) 1 MG capsule Take 1 capsule by mouth 2 (two) times daily.  . Tetrahydrozoline HCl (VISINE OP) Apply 1 drop to eye daily.  Marland Kitchen tiotropium (SPIRIVA) 18 MCG inhalation capsule Place 1 capsule (18 mcg total) into inhaler and inhale daily.     Allergies:   Patient has  no known allergies.   Social History   Tobacco Use  . Smoking status: Former Smoker    Packs/day: 1.00    Years: 45.00    Pack years: 45.00    Types: Cigarettes    Quit date: 01/21/1994    Years since quitting: 25.1  . Smokeless tobacco: Never Used  Substance Use Topics  . Alcohol use: Yes    Comment: a drink daily with dinner  . Drug use: No     Family Hx: The patient's family history includes Dementia in his brother and father; Diabetes in his brother; Heart attack in his father.  ROS:   Please see the history of present illness.     All other systems reviewed and are negative.   Prior CV studies:   The following studies were reviewed today:  NM Stress 02/16/2019  Nuclear stress EF: 50%. Visually, the EF appears to be greater than the 50% gererated by the computer calculations  There was no ST segment deviation noted during stress.  The study is normal.  This is a low risk study.   TTE 02/04/2019 1. Left ventricular ejection fraction, by visual estimation, is 60 to  65%. The left ventricle has normal function. There is no left ventricular  hypertrophy.  2. Left ventricular diastolic parameters are indeterminate.  3. The left ventricle has no regional wall motion abnormalities.  4. Global right ventricle has normal systolic function.The right  ventricular size is normal. No increase in right ventricular wall  thickness.  5. Left atrial size was normal.  6. Right atrial size was normal.  7. Moderate to severe mitral annular calcification.  8. The mitral valve is normal in structure. Trivial mitral valve  regurgitation.  9. The tricuspid valve is normal in structure.  10. Aortic valve regurgitation is mild to moderate.  11. The aortic valve is tricuspid. Aortic valve regurgitation is mild to  moderate.  12. The pulmonic valve was grossly normal. Pulmonic valve regurgitation is  not visualized.  13. The inferior vena cava is normal in size with greater  than 50%  respiratory variability, suggesting right atrial pressure of 3 mmHg.   Labs/Other Tests and Data Reviewed:    EKG:  No ECG reviewed.  Recent Labs: 01/27/2019: BNP 38.8; BUN 15; Creatinine, Ser 0.85; Potassium 4.4; Sodium 143; TSH 3.130   Recent Lipid Panel No results found for: CHOL, TRIG, HDL, CHOLHDL, LDLCALC, LDLDIRECT  Wt Readings from Last 3 Encounters:  03/11/19 150 lb (68 kg)  02/16/19 156 lb (70.8 kg)  02/09/19 156 lb 9.6 oz (71 kg)     Objective:    Vital Signs:  Ht 5' 9.5" (1.765 m)   Wt 150 lb (68 kg)   BMI 21.83 kg/m    VITAL SIGNS:  reviewed  Gen: NAD Pulm: no SOB, talking full sentences on phone  Psych: normal mood/affect   ASSESSMENT & PLAN:    1.  SOB (shortness of breath) on exertion -normal echo. Normal cardiac stress. BNP 38. Suspect this is COPD related. Will see as needed.   2. Essential hypertension -continue current medications    COVID-19 Education: The signs and symptoms of COVID-19 were discussed with the patient and how to seek care for testing (follow up with PCP or arrange E-visit).  The importance of social distancing was discussed today.  Time:   Today, I have spent 25 minutes with the patient with telehealth technology discussing the above problems.     Medication Adjustments/Labs and Tests Ordered: Current medicines are reviewed at length with the patient today.  Concerns regarding medicines are outlined above.   Tests Ordered: No orders of the defined types were placed in this encounter.   Medication Changes: No orders of the defined types were placed in this encounter.   Follow Up:  PRN  Signed, Evalina Field, MD  03/11/2019 2:06 PM    [place]

## 2019-03-11 ENCOUNTER — Encounter: Payer: Self-pay | Admitting: Cardiovascular Disease

## 2019-03-11 ENCOUNTER — Telehealth (INDEPENDENT_AMBULATORY_CARE_PROVIDER_SITE_OTHER): Payer: Medicare HMO | Admitting: Cardiovascular Disease

## 2019-03-11 VITALS — Ht 69.5 in | Wt 150.0 lb

## 2019-03-11 DIAGNOSIS — R0602 Shortness of breath: Secondary | ICD-10-CM

## 2019-03-11 DIAGNOSIS — I10 Essential (primary) hypertension: Secondary | ICD-10-CM

## 2019-03-11 NOTE — Patient Instructions (Signed)
Medication Instructions:  The current medical regimen is effective;  continue present plan and medications.  *If you need a refill on your cardiac medications before your next appointment, please call your pharmacy*  Follow-Up: At [facility], you and your health needs are our priority.  As part of our continuing mission to provide you with exceptional heart care, we have created designated Provider Care Teams.  These Care Teams include your primary Cardiologist (physician) and Advanced Practice Providers (APPs -  Physician Assistants and Nurse Practitioners) who all work together to provide you with the care you need, when you need it.  Your next appointment:   As needed  The format for your next appointment:   Either In Person or Virtual  Provider:   Eleonore Chiquito, MD

## 2019-04-15 DIAGNOSIS — H5203 Hypermetropia, bilateral: Secondary | ICD-10-CM | POA: Diagnosis not present

## 2019-04-29 ENCOUNTER — Other Ambulatory Visit: Payer: Self-pay

## 2019-04-29 ENCOUNTER — Encounter (HOSPITAL_BASED_OUTPATIENT_CLINIC_OR_DEPARTMENT_OTHER): Payer: Self-pay | Admitting: Surgery

## 2019-04-29 NOTE — Progress Notes (Addendum)
Spoke w/ via phone for pre-op interview---patient Lab needs dos----  I stat 8           COVID test ------05-03-2019 1345 Arrive at -------530 am 05-06-2019 NPO after ------midnight Medications to take morning of surgery -----albuterol inhaler prn and bring inahelr, symbicort, primidone, pravastatin, amlodipine, omeprazole Diabetic medication -----none day of surgery Patient Special Instructions ----- Pre-Op special Istructions ----- Patient verbalized understanding of instructions that were given at this phone interview. Patient denies shortness of breath, chest pain, fever, cough a this phone interview.  Anesthesia : patient had stress test 02-16-2019 chart to Janett Billow zanetto pa for review  PCP: dr Jeneen Rinks kim Cardiologist :dr Tito Dine 03-11-2019 epic, follow up prn Chest x-ray :none EKG :01-26-2019 epic Echo :02-04-2019 Stress test 02-16-2019 epic Cardiac Cath : none Sleep Study/ CPAP :none Fasting Blood Sugar :  Does not check blood sugar at home    Blood Thinner/ Instructions Maryjane Hurter Dose: n/a ASA / Instructions/ Last Dose : called dr gross office and dr gross office will call patient about if needs to stop 81 mg aspirin  Patient denies shortness of breath, chest pain, fever, and cough at this phone interview.

## 2019-04-29 NOTE — Progress Notes (Signed)
Spoke with angela at [facility] triage and she will check with dr gross about stopping 81 mg aspirin and call patient and instruct patient

## 2019-05-03 ENCOUNTER — Other Ambulatory Visit (HOSPITAL_COMMUNITY)
Admission: RE | Admit: 2019-05-03 | Discharge: 2019-05-03 | Disposition: A | Discharge status: Home / Self Care | Payer: Medicare HMO | Source: Ambulatory Visit | Attending: Surgery | Admitting: Surgery

## 2019-05-03 ENCOUNTER — Other Ambulatory Visit (HOSPITAL_COMMUNITY): Payer: Medicare HMO

## 2019-05-03 DIAGNOSIS — Z01812 Encounter for preprocedural laboratory examination: Secondary | ICD-10-CM | POA: Insufficient documentation

## 2019-05-03 DIAGNOSIS — Z20822 Contact with and (suspected) exposure to covid-19: Secondary | ICD-10-CM | POA: Diagnosis not present

## 2019-05-03 LAB — SARS CORONAVIRUS 2 (TAT 6-24 HRS): SARS Coronavirus 2: NEGATIVE

## 2019-05-05 NOTE — Anesthesia Preprocedure Evaluation (Addendum)
Anesthesia Evaluation  Patient identified by MRN, date of birth, ID band Patient awake    Reviewed: Allergy & Precautions, NPO status , Patient's Chart, lab work & pertinent test results  Airway Mallampati: III  TM Distance: >3 FB Neck ROM: Full    Dental  (+) Upper Dentures, Lower Dentures   Pulmonary shortness of breath and with exertion, COPD,  COPD inhaler, former smoker,    Pulmonary exam normal breath sounds clear to auscultation       Cardiovascular hypertension, Pt. on medications Normal cardiovascular exam Rhythm:Regular Rate:Normal  ECG: NSR, rate 79  Myocardial Perfusion: Nuclear stress EF: 50%. Visually, the AF appears to be greater than the 50% gererated by the computer calculations There was no ST segment deviation noted during stress. The study is normal. This is a low risk study.     Neuro/Psych negative neurological ROS  negative psych ROS   GI/Hepatic Neg liver ROS, GERD  Medicated and Controlled,  Endo/Other  Oral Hypoglycemic AgentsPre-DM  Renal/GU negative Renal ROS     Musculoskeletal negative musculoskeletal ROS (+)   Abdominal   Peds  Hematology HLD   Anesthesia Other Findings RIGHT AND POSSIBLE LEFT INGUINAL HERNIA(S) REPAIR  Reproductive/Obstetrics                            Anesthesia Physical Anesthesia Plan  ASA: III  Anesthesia Plan: General   Post-op Pain Management:    Induction: Intravenous  PONV Risk Score and Plan: 3 and Ondansetron, Dexamethasone and Treatment may vary due to age or medical condition  Airway Management Planned: Oral ETT  Additional Equipment:   Intra-op Plan:   Post-operative Plan: Extubation in OR  Informed Consent: I have reviewed the patients History and Physical, chart, labs and discussed the procedure including the risks, benefits and alternatives for the proposed anesthesia with the patient or authorized  representative who has indicated his/her understanding and acceptance.       Plan Discussed with: CRNA  Anesthesia Plan Comments:        Anesthesia Quick Evaluation

## 2019-05-06 ENCOUNTER — Ambulatory Visit (HOSPITAL_BASED_OUTPATIENT_CLINIC_OR_DEPARTMENT_OTHER): Payer: Medicare HMO | Admitting: Physician Assistant

## 2019-05-06 ENCOUNTER — Other Ambulatory Visit: Payer: Self-pay

## 2019-05-06 ENCOUNTER — Encounter (HOSPITAL_BASED_OUTPATIENT_CLINIC_OR_DEPARTMENT_OTHER): Payer: Self-pay | Admitting: Surgery

## 2019-05-06 ENCOUNTER — Encounter (HOSPITAL_COMMUNITY)
Admission: AD | Disposition: A | Discharge status: Home / Self Care | Payer: Self-pay | Source: Home / Self Care | Attending: Surgery

## 2019-05-06 ENCOUNTER — Observation Stay (HOSPITAL_BASED_OUTPATIENT_CLINIC_OR_DEPARTMENT_OTHER)
Admission: AD | Admit: 2019-05-06 | Discharge: 2019-05-07 | Disposition: A | Discharge status: Home / Self Care | Payer: Medicare HMO | Attending: Surgery | Admitting: Surgery

## 2019-05-06 DIAGNOSIS — Z7951 Long term (current) use of inhaled steroids: Secondary | ICD-10-CM | POA: Insufficient documentation

## 2019-05-06 DIAGNOSIS — Z833 Family history of diabetes mellitus: Secondary | ICD-10-CM | POA: Insufficient documentation

## 2019-05-06 DIAGNOSIS — Z79899 Other long term (current) drug therapy: Secondary | ICD-10-CM | POA: Diagnosis not present

## 2019-05-06 DIAGNOSIS — Z7984 Long term (current) use of oral hypoglycemic drugs: Secondary | ICD-10-CM | POA: Insufficient documentation

## 2019-05-06 DIAGNOSIS — Z7982 Long term (current) use of aspirin: Secondary | ICD-10-CM | POA: Diagnosis not present

## 2019-05-06 DIAGNOSIS — E785 Hyperlipidemia, unspecified: Secondary | ICD-10-CM | POA: Diagnosis not present

## 2019-05-06 DIAGNOSIS — D176 Benign lipomatous neoplasm of spermatic cord: Secondary | ICD-10-CM | POA: Diagnosis not present

## 2019-05-06 DIAGNOSIS — M1990 Unspecified osteoarthritis, unspecified site: Secondary | ICD-10-CM | POA: Diagnosis not present

## 2019-05-06 DIAGNOSIS — J449 Chronic obstructive pulmonary disease, unspecified: Secondary | ICD-10-CM | POA: Insufficient documentation

## 2019-05-06 DIAGNOSIS — R0602 Shortness of breath: Secondary | ICD-10-CM | POA: Insufficient documentation

## 2019-05-06 DIAGNOSIS — Z8349 Family history of other endocrine, nutritional and metabolic diseases: Secondary | ICD-10-CM | POA: Insufficient documentation

## 2019-05-06 DIAGNOSIS — Z820 Family history of epilepsy and other diseases of the nervous system: Secondary | ICD-10-CM | POA: Insufficient documentation

## 2019-05-06 DIAGNOSIS — Z8249 Family history of ischemic heart disease and other diseases of the circulatory system: Secondary | ICD-10-CM | POA: Diagnosis not present

## 2019-05-06 DIAGNOSIS — K4021 Bilateral inguinal hernia, without obstruction or gangrene, recurrent: Secondary | ICD-10-CM | POA: Diagnosis not present

## 2019-05-06 DIAGNOSIS — Z86010 Personal history of colonic polyps: Secondary | ICD-10-CM | POA: Diagnosis not present

## 2019-05-06 DIAGNOSIS — Z791 Long term (current) use of non-steroidal anti-inflammatories (NSAID): Secondary | ICD-10-CM | POA: Insufficient documentation

## 2019-05-06 DIAGNOSIS — I10 Essential (primary) hypertension: Secondary | ICD-10-CM | POA: Insufficient documentation

## 2019-05-06 DIAGNOSIS — Z87891 Personal history of nicotine dependence: Secondary | ICD-10-CM | POA: Diagnosis not present

## 2019-05-06 DIAGNOSIS — K4091 Unilateral inguinal hernia, without obstruction or gangrene, recurrent: Principal | ICD-10-CM | POA: Diagnosis present

## 2019-05-06 DIAGNOSIS — E119 Type 2 diabetes mellitus without complications: Secondary | ICD-10-CM | POA: Diagnosis not present

## 2019-05-06 DIAGNOSIS — I7300 Raynaud's syndrome without gangrene: Secondary | ICD-10-CM | POA: Diagnosis not present

## 2019-05-06 DIAGNOSIS — J441 Chronic obstructive pulmonary disease with (acute) exacerbation: Secondary | ICD-10-CM | POA: Diagnosis present

## 2019-05-06 DIAGNOSIS — K4090 Unilateral inguinal hernia, without obstruction or gangrene, not specified as recurrent: Secondary | ICD-10-CM | POA: Diagnosis not present

## 2019-05-06 DIAGNOSIS — R0902 Hypoxemia: Secondary | ICD-10-CM | POA: Diagnosis not present

## 2019-05-06 DIAGNOSIS — E7800 Pure hypercholesterolemia, unspecified: Secondary | ICD-10-CM | POA: Diagnosis not present

## 2019-05-06 DIAGNOSIS — K219 Gastro-esophageal reflux disease without esophagitis: Secondary | ICD-10-CM | POA: Insufficient documentation

## 2019-05-06 HISTORY — PX: INGUINAL HERNIA REPAIR: SHX194

## 2019-05-06 HISTORY — DX: Type 2 diabetes mellitus without complications: E11.9

## 2019-05-06 HISTORY — DX: Tremor, unspecified: R25.1

## 2019-05-06 LAB — CBC
HCT: 41.2 % (ref 39.0–52.0)
Hemoglobin: 13.3 g/dL (ref 13.0–17.0)
MCH: 32.5 pg (ref 26.0–34.0)
MCHC: 32.3 g/dL (ref 30.0–36.0)
MCV: 100.7 fL — ABNORMAL HIGH (ref 80.0–100.0)
Platelets: 156 10*3/uL (ref 150–400)
RBC: 4.09 MIL/uL — ABNORMAL LOW (ref 4.22–5.81)
RDW: 13.5 % (ref 11.5–15.5)
WBC: 7.7 10*3/uL (ref 4.0–10.5)
nRBC: 0 % (ref 0.0–0.2)

## 2019-05-06 LAB — POCT I-STAT, CHEM 8
BUN: 17 mg/dL (ref 8–23)
Calcium, Ion: 1.23 mmol/L (ref 1.15–1.40)
Chloride: 104 mmol/L (ref 98–111)
Creatinine, Ser: 0.9 mg/dL (ref 0.61–1.24)
Glucose, Bld: 114 mg/dL — ABNORMAL HIGH (ref 70–99)
HCT: 39 % (ref 39.0–52.0)
Hemoglobin: 13.3 g/dL (ref 13.0–17.0)
Potassium: 3.9 mmol/L (ref 3.5–5.1)
Sodium: 143 mmol/L (ref 135–145)
TCO2: 30 mmol/L (ref 22–32)

## 2019-05-06 LAB — CREATININE, SERUM
Creatinine, Ser: 1 mg/dL (ref 0.61–1.24)
GFR calc Af Amer: >60 mL/min (ref >60)
GFR calc non Af Amer: >60 mL/min (ref >60)

## 2019-05-06 LAB — GLUCOSE, CAPILLARY
Glucose-Capillary: 148 mg/dL — ABNORMAL HIGH (ref 70–99)
Glucose-Capillary: 193 mg/dL — ABNORMAL HIGH (ref 70–99)
Glucose-Capillary: 203 mg/dL — ABNORMAL HIGH (ref 70–99)
Glucose-Capillary: 176 mg/dL — ABNORMAL HIGH (ref 70–99)

## 2019-05-06 SURGERY — REPAIR, HERNIA, INGUINAL, LAPAROSCOPIC
Anesthesia: General | Site: Abdomen

## 2019-05-06 MED ORDER — LACTATED RINGERS IV SOLN
INTRAVENOUS | Status: DC
  Filled 2019-05-06: qty 1000

## 2019-05-06 MED ORDER — METOPROLOL TARTRATE 5 MG/5ML IV SOLN: 5.0000 mg | Freq: Four times a day (QID) | INTRAVENOUS | Status: DC | PRN

## 2019-05-06 MED ORDER — BUPIVACAINE LIPOSOME 1.3 % IJ SUSP
20.0000 mL | Freq: Once | INTRAMUSCULAR | Status: DC
  Filled 2019-05-06: qty 20

## 2019-05-06 MED ORDER — LIP MEDEX EX OINT
1.0000 "application " | TOPICAL_OINTMENT | Freq: Two times a day (BID) | CUTANEOUS | Status: DC
  Administered 2019-05-07: 1 via TOPICAL
  Filled 2019-05-06 (×2): qty 7

## 2019-05-06 MED ORDER — DEXAMETHASONE SODIUM PHOSPHATE 10 MG/ML IJ SOLN
INTRAMUSCULAR | Status: DC | PRN
  Administered 2019-05-06: 10 mg via INTRAVENOUS

## 2019-05-06 MED ORDER — CHLORHEXIDINE GLUCONATE CLOTH 2 % EX PADS
6.0000 | MEDICATED_PAD | Freq: Once | CUTANEOUS | Status: DC
  Filled 2019-05-06: qty 6

## 2019-05-06 MED ORDER — SODIUM CHLORIDE 0.9% FLUSH: 3.0000 mL | INTRAVENOUS | Status: DC | PRN

## 2019-05-06 MED ORDER — BUPIVACAINE LIPOSOME 1.3 % IJ SUSP
INTRAMUSCULAR | Status: DC | PRN
  Administered 2019-05-06: 20 mL

## 2019-05-06 MED ORDER — MENTHOL 3 MG MT LOZG: 1.0000 | LOZENGE | OROMUCOSAL | Status: DC | PRN

## 2019-05-06 MED ORDER — PROPOFOL 10 MG/ML IV BOLUS
INTRAVENOUS | Status: AC
  Filled 2019-05-06: qty 20

## 2019-05-06 MED ORDER — INSULIN ASPART 100 UNIT/ML ~~LOC~~ SOLN: 0.0000 [IU] | Freq: Every day | SUBCUTANEOUS | Status: DC

## 2019-05-06 MED ORDER — SODIUM CHLORIDE 0.9 % IV SOLN: 250.0000 mL | INTRAVENOUS | Status: DC | PRN

## 2019-05-06 MED ORDER — PHENOL 1.4 % MT LIQD
1.0000 | OROMUCOSAL | Status: DC | PRN
  Filled 2019-05-06: qty 177

## 2019-05-06 MED ORDER — BUPIVACAINE-EPINEPHRINE 0.25% -1:200000 IJ SOLN
INTRAMUSCULAR | Status: DC | PRN
  Administered 2019-05-06: 60 mL

## 2019-05-06 MED ORDER — AMLODIPINE BESYLATE 5 MG PO TABS
5.0000 mg | ORAL_TABLET | Freq: Every day | ORAL | Status: DC
  Administered 2019-05-07: 5 mg via ORAL
  Filled 2019-05-06: qty 1

## 2019-05-06 MED ORDER — HYDROCORTISONE 1 % EX CREA
1.0000 "application " | TOPICAL_CREAM | Freq: Three times a day (TID) | CUTANEOUS | Status: DC | PRN
  Filled 2019-05-06: qty 28

## 2019-05-06 MED ORDER — MAGIC MOUTHWASH
15.0000 mL | Freq: Four times a day (QID) | ORAL | Status: DC | PRN
  Filled 2019-05-06: qty 15

## 2019-05-06 MED ORDER — LIDOCAINE 2% (20 MG/ML) 5 ML SYRINGE
INTRAMUSCULAR | Status: AC
  Filled 2019-05-06: qty 5

## 2019-05-06 MED ORDER — ASPIRIN EC 81 MG PO TBEC
81.0000 mg | DELAYED_RELEASE_TABLET | Freq: Every day | ORAL | Status: DC
  Administered 2019-05-07: 11:00:00 81 mg via ORAL
  Filled 2019-05-06: qty 1

## 2019-05-06 MED ORDER — ONDANSETRON HCL 4 MG/2ML IJ SOLN
INTRAMUSCULAR | Status: AC
  Filled 2019-05-06: qty 2

## 2019-05-06 MED ORDER — VITAMIN B-12 1000 MCG PO TABS
1000.0000 ug | ORAL_TABLET | Freq: Every day | ORAL | Status: DC
  Administered 2019-05-07: 1000 ug via ORAL
  Filled 2019-05-06: qty 1

## 2019-05-06 MED ORDER — EPHEDRINE SULFATE-NACL 50-0.9 MG/10ML-% IV SOSY
PREFILLED_SYRINGE | INTRAVENOUS | Status: DC | PRN
  Administered 2019-05-06 (×2): 10 mg via INTRAVENOUS

## 2019-05-06 MED ORDER — B-12 1000 MCG PO CAPS: 1.0000 | ORAL_CAPSULE | Freq: Every day | ORAL | Status: DC

## 2019-05-06 MED ORDER — FENTANYL CITRATE (PF) 100 MCG/2ML IJ SOLN
INTRAMUSCULAR | Status: AC
  Filled 2019-05-06: qty 2

## 2019-05-06 MED ORDER — MOMETASONE FURO-FORMOTEROL FUM 200-5 MCG/ACT IN AERO
2.0000 | INHALATION_SPRAY | Freq: Two times a day (BID) | RESPIRATORY_TRACT | Status: DC
  Administered 2019-05-06 – 2019-05-07 (×2): 2 via RESPIRATORY_TRACT
  Filled 2019-05-06: qty 8.8

## 2019-05-06 MED ORDER — FLAXSEED OIL 1200 MG PO CAPS: 1.0000 | ORAL_CAPSULE | Freq: Every day | ORAL | Status: DC

## 2019-05-06 MED ORDER — LIDOCAINE 2% (20 MG/ML) 5 ML SYRINGE
INTRAMUSCULAR | Status: DC | PRN
  Administered 2019-05-06: 1.5 mg/kg/h via INTRAVENOUS

## 2019-05-06 MED ORDER — ENOXAPARIN SODIUM 40 MG/0.4ML ~~LOC~~ SOLN
40.0000 mg | SUBCUTANEOUS | Status: DC
  Administered 2019-05-07: 40 mg via SUBCUTANEOUS
  Filled 2019-05-06 (×2): qty 0.4

## 2019-05-06 MED ORDER — HYDROCORTISONE (PERIANAL) 2.5 % EX CREA
1.0000 "application " | TOPICAL_CREAM | Freq: Four times a day (QID) | CUTANEOUS | Status: DC | PRN
  Filled 2019-05-06: qty 28.35

## 2019-05-06 MED ORDER — METHOCARBAMOL 500 MG PO TABS: 1000.0000 mg | ORAL_TABLET | Freq: Four times a day (QID) | ORAL | Status: DC | PRN

## 2019-05-06 MED ORDER — INSULIN ASPART 100 UNIT/ML ~~LOC~~ SOLN: 0.0000 [IU] | Freq: Three times a day (TID) | SUBCUTANEOUS | Status: DC

## 2019-05-06 MED ORDER — SIMETHICONE 40 MG/0.6ML PO SUSP
40.0000 mg | Freq: Four times a day (QID) | ORAL | Status: DC | PRN
  Filled 2019-05-06: qty 0.6

## 2019-05-06 MED ORDER — BUPIVACAINE LIPOSOME 1.3 % IJ SUSP
INTRAMUSCULAR | Status: AC
  Filled 2019-05-06: qty 120

## 2019-05-06 MED ORDER — GUAIFENESIN-DM 100-10 MG/5ML PO SYRP: 10.0000 mL | ORAL_SOLUTION | ORAL | Status: DC | PRN

## 2019-05-06 MED ORDER — LIDOCAINE 2% (20 MG/ML) 5 ML SYRINGE
INTRAMUSCULAR | Status: DC | PRN
  Administered 2019-05-06: 60 mg via INTRAVENOUS

## 2019-05-06 MED ORDER — ACETAMINOPHEN 500 MG PO TABS
1000.0000 mg | ORAL_TABLET | Freq: Three times a day (TID) | ORAL | Status: DC
  Administered 2019-05-06 – 2019-05-07 (×3): 1000 mg via ORAL
  Filled 2019-05-06 (×3): qty 2

## 2019-05-06 MED ORDER — GABAPENTIN 300 MG PO CAPS
ORAL_CAPSULE | ORAL | Status: AC
  Filled 2019-05-06: qty 1

## 2019-05-06 MED ORDER — 0.9 % SODIUM CHLORIDE (POUR BTL) OPTIME
TOPICAL | Status: DC | PRN
  Administered 2019-05-06: 2000 mL

## 2019-05-06 MED ORDER — PRAVASTATIN SODIUM 20 MG PO TABS
20.0000 mg | ORAL_TABLET | Freq: Every day | ORAL | Status: DC
  Administered 2019-05-07: 20 mg via ORAL
  Filled 2019-05-06: qty 1

## 2019-05-06 MED ORDER — BUPIVACAINE HCL (PF) 0.25 % IJ SOLN
INTRAMUSCULAR | Status: AC
  Filled 2019-05-06: qty 180

## 2019-05-06 MED ORDER — FENTANYL CITRATE (PF) 100 MCG/2ML IJ SOLN
25.0000 ug | INTRAMUSCULAR | Status: DC | PRN
  Filled 2019-05-06: qty 1

## 2019-05-06 MED ORDER — PHENYLEPHRINE 40 MCG/ML (10ML) SYRINGE FOR IV PUSH (FOR BLOOD PRESSURE SUPPORT)
PREFILLED_SYRINGE | INTRAVENOUS | Status: DC | PRN
  Administered 2019-05-06: 80 ug via INTRAVENOUS

## 2019-05-06 MED ORDER — TRAMADOL HCL 50 MG PO TABS: 50.0000 mg | ORAL_TABLET | Freq: Four times a day (QID) | ORAL | Status: DC | PRN

## 2019-05-06 MED ORDER — ENALAPRILAT 1.25 MG/ML IV SOLN
0.6250 mg | Freq: Four times a day (QID) | INTRAVENOUS | Status: DC | PRN
  Filled 2019-05-06: qty 1

## 2019-05-06 MED ORDER — ROCURONIUM BROMIDE 10 MG/ML (PF) SYRINGE
PREFILLED_SYRINGE | INTRAVENOUS | Status: AC
  Filled 2019-05-06: qty 10

## 2019-05-06 MED ORDER — TERAZOSIN HCL 1 MG PO CAPS
1.0000 mg | ORAL_CAPSULE | Freq: Two times a day (BID) | ORAL | Status: DC
  Administered 2019-05-06 – 2019-05-07 (×2): 1 mg via ORAL
  Filled 2019-05-06 (×3): qty 1

## 2019-05-06 MED ORDER — CEFAZOLIN SODIUM-DEXTROSE 2-4 GM/100ML-% IV SOLN
INTRAVENOUS | Status: AC
  Filled 2019-05-06: qty 100

## 2019-05-06 MED ORDER — ACETAMINOPHEN 500 MG PO TABS
ORAL_TABLET | ORAL | Status: AC
  Filled 2019-05-06: qty 2

## 2019-05-06 MED ORDER — FENTANYL CITRATE (PF) 100 MCG/2ML IJ SOLN
INTRAMUSCULAR | Status: DC | PRN
  Administered 2019-05-06 (×2): 50 ug via INTRAVENOUS

## 2019-05-06 MED ORDER — ROCURONIUM BROMIDE 10 MG/ML (PF) SYRINGE
PREFILLED_SYRINGE | INTRAVENOUS | Status: DC | PRN
  Administered 2019-05-06: 10 mg via INTRAVENOUS
  Administered 2019-05-06: 50 mg via INTRAVENOUS

## 2019-05-06 MED ORDER — ALBUTEROL SULFATE (2.5 MG/3ML) 0.083% IN NEBU: 3.0000 mL | INHALATION_SOLUTION | RESPIRATORY_TRACT | Status: DC | PRN

## 2019-05-06 MED ORDER — POLYETHYLENE GLYCOL 3350 17 G PO PACK
17.0000 g | PACK | Freq: Two times a day (BID) | ORAL | Status: DC
  Administered 2019-05-06 – 2019-05-07 (×2): 17 g via ORAL
  Filled 2019-05-06 (×2): qty 1

## 2019-05-06 MED ORDER — PHENYLEPHRINE 40 MCG/ML (10ML) SYRINGE FOR IV PUSH (FOR BLOOD PRESSURE SUPPORT)
PREFILLED_SYRINGE | INTRAVENOUS | Status: AC
  Filled 2019-05-06: qty 10

## 2019-05-06 MED ORDER — KETAMINE HCL 10 MG/ML IJ SOLN
INTRAMUSCULAR | Status: AC
  Filled 2019-05-06: qty 1

## 2019-05-06 MED ORDER — SODIUM CHLORIDE 0.9 % IV SOLN
8.0000 mg | Freq: Four times a day (QID) | INTRAVENOUS | Status: DC | PRN
  Filled 2019-05-06: qty 4

## 2019-05-06 MED ORDER — GABAPENTIN 300 MG PO CAPS
300.0000 mg | ORAL_CAPSULE | ORAL | Status: AC
  Administered 2019-05-06: 06:00:00 300 mg via ORAL
  Filled 2019-05-06: qty 1

## 2019-05-06 MED ORDER — PROPOFOL 10 MG/ML IV BOLUS
INTRAVENOUS | Status: DC | PRN
  Administered 2019-05-06: 150 mg via INTRAVENOUS

## 2019-05-06 MED ORDER — ALBUMIN HUMAN 5 % IV SOLN
12.5000 g | Freq: Four times a day (QID) | INTRAVENOUS | Status: DC | PRN
  Filled 2019-05-06: qty 250

## 2019-05-06 MED ORDER — KETAMINE HCL 10 MG/ML IJ SOLN
INTRAMUSCULAR | Status: DC | PRN
  Administered 2019-05-06 (×2): 15 mg via INTRAVENOUS

## 2019-05-06 MED ORDER — GINKGO BILOBA EXTR: 1.0000 | Freq: Every day | Status: DC

## 2019-05-06 MED ORDER — CEFAZOLIN SODIUM-DEXTROSE 2-4 GM/100ML-% IV SOLN
2.0000 g | INTRAVENOUS | Status: AC
  Administered 2019-05-06: 08:00:00 2 g via INTRAVENOUS
  Filled 2019-05-06: qty 100

## 2019-05-06 MED ORDER — ALUM & MAG HYDROXIDE-SIMETH 200-200-20 MG/5ML PO SUSP: 30.0000 mL | Freq: Four times a day (QID) | ORAL | Status: DC | PRN

## 2019-05-06 MED ORDER — GABAPENTIN 300 MG PO CAPS
300.0000 mg | ORAL_CAPSULE | Freq: Every day | ORAL | Status: DC
  Administered 2019-05-06: 300 mg via ORAL
  Filled 2019-05-06: qty 1

## 2019-05-06 MED ORDER — PRIMIDONE 50 MG PO TABS
50.0000 mg | ORAL_TABLET | Freq: Two times a day (BID) | ORAL | Status: DC
  Administered 2019-05-06 – 2019-05-07 (×2): 50 mg via ORAL
  Filled 2019-05-06 (×3): qty 1

## 2019-05-06 MED ORDER — SUGAMMADEX SODIUM 200 MG/2ML IV SOLN
INTRAVENOUS | Status: DC | PRN
  Administered 2019-05-06: 150 mg via INTRAVENOUS

## 2019-05-06 MED ORDER — DEXAMETHASONE SODIUM PHOSPHATE 10 MG/ML IJ SOLN
INTRAMUSCULAR | Status: AC
  Filled 2019-05-06: qty 1

## 2019-05-06 MED ORDER — ONDANSETRON HCL 4 MG/2ML IJ SOLN
INTRAMUSCULAR | Status: DC | PRN
  Administered 2019-05-06: 4 mg via INTRAVENOUS

## 2019-05-06 MED ORDER — IPRATROPIUM-ALBUTEROL 0.5-2.5 (3) MG/3ML IN SOLN
3.0000 mL | Freq: Once | RESPIRATORY_TRACT | Status: AC
  Administered 2019-05-06: 3 mL via RESPIRATORY_TRACT
  Filled 2019-05-06 (×2): qty 3

## 2019-05-06 MED ORDER — SODIUM CHLORIDE 0.9% FLUSH
3.0000 mL | Freq: Two times a day (BID) | INTRAVENOUS | Status: DC
  Administered 2019-05-07: 3 mL via INTRAVENOUS

## 2019-05-06 MED ORDER — METHOCARBAMOL 1000 MG/10ML IJ SOLN
1000.0000 mg | Freq: Four times a day (QID) | INTRAVENOUS | Status: DC | PRN
  Filled 2019-05-06: qty 10

## 2019-05-06 MED ORDER — ONDANSETRON HCL 4 MG/2ML IJ SOLN: 4.0000 mg | Freq: Four times a day (QID) | INTRAMUSCULAR | Status: DC | PRN

## 2019-05-06 MED ORDER — PANTOPRAZOLE SODIUM 40 MG PO TBEC
40.0000 mg | DELAYED_RELEASE_TABLET | Freq: Every day | ORAL | Status: DC
  Administered 2019-05-07: 40 mg via ORAL
  Filled 2019-05-06: qty 1

## 2019-05-06 MED ORDER — ADULT MULTIVITAMIN W/MINERALS CH
1.0000 | ORAL_TABLET | Freq: Every day | ORAL | Status: DC
  Administered 2019-05-07: 1 via ORAL
  Filled 2019-05-06: qty 1

## 2019-05-06 MED ORDER — DIPHENHYDRAMINE HCL 50 MG/ML IJ SOLN: 12.5000 mg | Freq: Four times a day (QID) | INTRAMUSCULAR | Status: DC | PRN

## 2019-05-06 MED ORDER — METFORMIN HCL 500 MG PO TABS
250.0000 mg | ORAL_TABLET | Freq: Every day | ORAL | Status: DC
  Administered 2019-05-07: 250 mg via ORAL
  Filled 2019-05-06: qty 1

## 2019-05-06 MED ORDER — ACETAMINOPHEN 500 MG PO TABS
1000.0000 mg | ORAL_TABLET | ORAL | Status: AC
  Administered 2019-05-06: 06:00:00 1000 mg via ORAL
  Filled 2019-05-06: qty 2

## 2019-05-06 MED ORDER — TRAMADOL HCL 50 MG PO TABS: 50.0000 mg | ORAL_TABLET | Freq: Four times a day (QID) | ORAL | 0 refills | Status: DC | PRN

## 2019-05-06 MED ORDER — ONDANSETRON HCL 4 MG/2ML IJ SOLN
4.0000 mg | Freq: Once | INTRAMUSCULAR | Status: DC | PRN
  Filled 2019-05-06: qty 2

## 2019-05-06 SURGICAL SUPPLY — 41 items
CABLE HIGH FREQUENCY MONO STRZ (ELECTRODE) ×2 IMPLANT
CANISTER SUCT 3000ML PPV (MISCELLANEOUS) IMPLANT
CHLORAPREP W/TINT 26 (MISCELLANEOUS) ×2 IMPLANT
COVER WAND RF STERILE (DRAPES) ×2 IMPLANT
DECANTER SPIKE VIAL GLASS SM (MISCELLANEOUS) ×2 IMPLANT
DEVICE SECURE STRAP 25 ABSORB (INSTRUMENTS) IMPLANT
DRAPE WARM FLUID 44X44 (DRAPES) ×2 IMPLANT
DRSG TEGADERM 2-3/8X2-3/4 SM (GAUZE/BANDAGES/DRESSINGS) ×7 IMPLANT
DRSG TEGADERM 4X4.75 (GAUZE/BANDAGES/DRESSINGS) ×2 IMPLANT
ELECT REM PT RETURN 9FT ADLT (ELECTROSURGICAL) ×2
ELECTRODE REM PT RTRN 9FT ADLT (ELECTROSURGICAL) ×1 IMPLANT
GLOVE ECLIPSE 8.0 STRL XLNG CF (GLOVE) ×2 IMPLANT
GLOVE INDICATOR 8.0 STRL GRN (GLOVE) ×2 IMPLANT
GOWN STRL REUS W/TWL XL LVL3 (GOWN DISPOSABLE) ×2 IMPLANT
IRRIG SUCT STRYKERFLOW 2 WTIP (MISCELLANEOUS)
IRRIGATION SUCT STRKRFLW 2 WTP (MISCELLANEOUS) IMPLANT
KIT TURNOVER CYSTO (KITS) ×2 IMPLANT
MANIFOLD NEPTUNE II (INSTRUMENTS) IMPLANT
MESH HERNIA 6X6 BARD (Mesh General) IMPLANT
MESH HERNIA BARD 6X6 (Mesh General) ×1 IMPLANT
MESH ULTRAPRO 6X6 15CM15CM (Mesh General) ×2 IMPLANT
NEEDLE HYPO 22GX1.5 SAFETY (NEEDLE) ×2 IMPLANT
NS IRRIG 500ML POUR BTL (IV SOLUTION) ×2 IMPLANT
PACK BASIN DAY SURGERY FS (CUSTOM PROCEDURE TRAY) ×2 IMPLANT
PAD POSITIONING PINK XL (MISCELLANEOUS) ×2 IMPLANT
SCISSORS LAP 5X35 DISP (ENDOMECHANICALS) ×2 IMPLANT
SET TUBE SMOKE EVAC HIGH FLOW (TUBING) ×2 IMPLANT
SLEEVE ADV FIXATION 5X100MM (TROCAR) ×2 IMPLANT
SPONGE GAUZE 2X2 8PLY STRL LF (GAUZE/BANDAGES/DRESSINGS) ×9 IMPLANT
SUT MNCRL AB 4-0 PS2 18 (SUTURE) ×2 IMPLANT
SUT MON AB 4-0 PS1 27 (SUTURE) ×1 IMPLANT
SUT PDS AB 1 CT1 27 (SUTURE) IMPLANT
SUT VIC AB 2-0 SH 27 (SUTURE) ×4
SUT VIC AB 2-0 SH 27XBRD (SUTURE) IMPLANT
SUT VICRYL 0 UR6 27IN ABS (SUTURE) IMPLANT
TOWEL OR 17X26 10 PK STRL BLUE (TOWEL DISPOSABLE) ×2 IMPLANT
TRAY DSU PREP LF (CUSTOM PROCEDURE TRAY) ×2 IMPLANT
TRAY LAPAROSCOPIC (CUSTOM PROCEDURE TRAY) ×2 IMPLANT
TROCAR ADV FIXATION 5X100MM (TROCAR) ×2 IMPLANT
TROCAR XCEL BLUNT TIP 100MML (ENDOMECHANICALS) ×2 IMPLANT
WATER STERILE IRR 500ML POUR (IV SOLUTION) ×2 IMPLANT

## 2019-05-06 NOTE — H&P (Signed)
Johnathan [name]  Documented: 03/01/2019 2:46 PM  Patient sent for surgical consultation at the request of Dr [name]  Chief Complaint: Worsening right inguinal hernia.  `  `  The patient is an active male. He comes by himself. History of mild COPD stable on inhalers. He needed prednisone taper last year but nothing since. Borderline diabetes stable and oral hypoglycemic. He is noticed swelling in his right groin for many years. Its Larger. Becomes More Comfortable Now. Usually Walks in the [facility] for At [employer] 20-30 Minutes without Much Difficulty. He'll Works Engineer, civil (consulting) with Running errands/Driving. Likes to Stay Active. Because the Right Groin Hernia Became Larger and More Symptomatic, so His Primary Care Physician Offered Consultation. She Does Not Smoke. He Recalls Having an Open Right Inguinal Hernia Repair Done Many Decades Ago When He Lived in [place]. He's Not Had Any Cardiac Events. He does not smoke. Some questionable diagnosis of rheumatoid arthritis but not immunosuppressed. He denies any history of prostate issues. No nocturia. No difficulty with starting her maintaining urinary stream. No issues with constipation. Usually moves his bowels every morning.  (Review of systems as stated in this history (HPI) or in the review of systems. Otherwise all other 12 point ROS are negative)  `  `  `  This patient encounter took 45 minutes today to perform the following: obtain history, perform exam, review outside records, interpret tests & imaging, counsel the patient on their diagnosis; and, document this encounter, including findings & plan in the electronic health record (EHR).  Past Surgical History Andreas Blower, [place]; 03/01/2019 2:46 PM)  Anal Fissure Repair  Colon Polyp Removal - Colonoscopy  Diagnostic Studies History Andreas Blower, CMA; 03/01/2019 2:46 PM)  Colonoscopy 5-10 years ago  Allergies Andreas Blower, [place]; 03/01/2019 2:48 PM)  No Known Allergies  [03/01/2019]:  Medication History (Armen Ferguson, CMA; 03/01/2019 2:52 PM)  Proventil HFA (108 (90 Base)MCG/ACT Aerosol Soln, Inhalation) Active.  Norvasc (5MG  Tablet, Oral) Active.  Aspirin (81MG  Tablet, Oral) Active.  Symbicort (160-4.5MCG/ACT Aerosol, Inhalation) Active.  Vitamin D3 (125 MCG(5000 UT) Tablet, Oral) Active.  Vitamin B-12 (1000MCG Tablet, Oral) Active.  Flaxseed Oil (1200MG  Capsule, Oral) Active.  metFORMIN HCl (500MG  Tablet, Oral) Active.  Multivitamins/Minerals (Oral) Active.  Omeprazole (20MG  Capsule DR, Oral) Active.  Pravastatin Sodium (40MG  Tablet, Oral) Active.  predniSONE (50MG  Tablet, Oral) Active.  Mysoline (50MG  Tablet, Oral) Active.  Hytrin (1MG  Capsule, Oral) Active.  Visine (0.05% Solution, Ophthalmic) Active.  Spiriva HandiHaler (18MCG Capsule, Inhalation) Active.  Medications Reconciled  Social History Andreas Blower, CMA; 03/01/2019 2:46 PM)  Alcohol use Moderate alcohol use.  Caffeine use Coffee.  Tobacco use Former smoker.  Family History (Armen Glo Herring, [place]; 03/01/2019 2:46 PM)  Heart disease in male family member before age 71  Thyroid problems Mother.  Other Problems Andreas Blower, CMA; 03/01/2019 2:46 PM)  Arthritis  Back Pain  Chronic Obstructive Lung Disease  Gastroesophageal Reflux Disease  Hemorrhoids  High blood pressure  Hypercholesterolemia  Review of Systems (Armen Ferguson CMA; 03/01/2019 2:46 PM)  General Not Present- Appetite Loss, Chills, Fatigue, Fever, Night Sweats, Weight Gain and Weight Loss.  Skin Present- Dryness. Not Present- Change in Wart/Mole, Hives, Jaundice, New Lesions, Non-Healing Wounds, Rash and Ulcer.  HEENT Present- Seasonal Allergies. Not Present- Earache, Hearing Loss, Hoarseness, Nose Bleed, Oral Ulcers, Ringing in the Ears, Sinus Pain, Sore Throat, Visual Disturbances, Wears glasses/contact lenses and Yellow Eyes.  Gastrointestinal Present- Difficulty Swallowing. Not Present- Abdominal Pain, Bloating, Bloody  Stool, Change in Bowel Habits, Chronic diarrhea, Constipation, Excessive gas, Gets full quickly at meals, Hemorrhoids, Indigestion, Nausea, Rectal Pain and Vomiting.  Neurological Present- Tremor. Not Present- Decreased Memory, Fainting, Headaches, Numbness, Seizures, Tingling, Trouble walking and Weakness.  Psychiatric Not Present- Anxiety, Bipolar, Change in Sleep Pattern, Depression, Fearful and Frequent crying.  Vitals (Armen Ferguson CMA; 03/01/2019 2:48 PM)  03/01/2019 2:47 PM  Weight: 159.13 lb Height: 70 in  Body Surface Area: 1.89 m Body Mass Index: 22.83 kg/m  Temp.: 98.4 F Pulse: 81 (Regular) P.OX: 93% (Room air)  BP: 156/88 (Sitting, Left Arm, Standard)  Physical Exam [name] MD; 03/01/2019 3:22 PM)  General  Mental Status - Alert.  General Appearance - Not in acute distress, Not Sickly.  Orientation - Oriented X3.  Hydration - Well hydrated.  Voice - Normal.  Integumentary  Global Assessment  Upon inspection and palpation of skin surfaces of the - Axillae: non-tender, no inflammation or ulceration, no drainage. and Distribution of scalp and body hair is normal.  General Characteristics  Temperature - normal warmth is noted.  Head and Neck  Head - normocephalic, atraumatic with no lesions or palpable masses.  Face  Global Assessment - atraumatic, no absence of expression.  Neck  Global Assessment - no abnormal movements, no bruit auscultated on the right, no bruit auscultated on the left, no decreased range of motion, non-tender.  Trachea - midline.  Thyroid  Gland Characteristics - non-tender.  Eye  Eyeball - Left - Extraocular movements intact, No Nystagmus - Left.  Eyeball - Right - Extraocular movements intact, No Nystagmus - Right.  Cornea - Left - No Hazy - Left.  Cornea - Right - No Hazy - Right.  Sclera/Conjunctiva - Left - No scleral icterus, No Discharge - Left.  Sclera/Conjunctiva - Right - No scleral icterus, No Discharge - Right.  Pupil - Left -  Direct reaction to light normal.  Pupil - Right - Direct reaction to light normal.  ENMT  Ears  Pinna - Left - no drainage observed, no generalized tenderness observed. Pinna - Right - no drainage observed, no generalized tenderness observed.  Nose and Sinuses  External Inspection of the Nose - no destructive lesion observed. Inspection of the nares - Left - quiet respiration. Inspection of the nares - Right - quiet respiration.  Mouth and Throat  Lips - Upper Lip - no fissures observed, no pallor noted. Lower Lip - no fissures observed, no pallor noted. Nasopharynx - no discharge present. Oral Cavity/Oropharynx - Tongue - no dryness observed. Oral Mucosa - no cyanosis observed. Hypopharynx - no evidence of airway distress observed.  Chest and Lung Exam  Inspection  Movements - Normal and Symmetrical. Accessory muscles - No use of accessory muscles in breathing.  Palpation  Palpation of the chest reveals - Non-tender.  Auscultation  Breath sounds - Normal and Clear.  Cardiovascular  Auscultation  Rhythm - Regular. Murmurs & Other Heart Sounds - Auscultation of the heart reveals - No Murmurs and No Systolic Clicks.  Abdomen  Inspection  Inspection of the abdomen reveals - No Visible peristalsis and No Abnormal pulsations. Umbilicus - No Bleeding, No Urine drainage.  Palpation/Percussion  Palpation and Percussion of the abdomen reveal - Soft, Non Tender, No Rebound tenderness, No Rigidity (guarding) and No Cutaneous hyperesthesia.  Note: Abdomen soft. Moderate supraumbilical diastases recti. Thin abdominal wall. Nontender. Not distended. No umbilical hernia. No guarding.   Male Genitourinary  Sexual Maturity  Tanner 5 - Adult hair  pattern and Adult penile size and shape.  Note: Obvious right inguinal hernia to groin only. Reducible. Small impulse on left side suspicious for indirect inguinal hernia.  Uncircumcised male. Testes and epididymides within normal limits.  Peripheral Vascular    Upper Extremity  Inspection - Left - No Cyanotic nailbeds - Left, Not Ischemic. Inspection - Right - No Cyanotic nailbeds - Right, Not Ischemic.  Neurologic  Neurologic evaluation reveals - normal attention span and ability to concentrate, able to name objects and repeat phrases. Appropriate fund of knowledge , normal sensation and normal coordination.  Mental Status  Affect - not angry, not paranoid.  Cranial Nerves - Normal Bilaterally.  Gait - Normal.  Neuropsychiatric  Mental status exam performed with findings of - able to articulate well with normal speech/language, rate, volume and coherence, thought content normal with ability to perform basic computations and apply abstract reasoning and no evidence of hallucinations, delusions, obsessions or homicidal/suicidal ideation.  Musculoskeletal  Global Assessment  Spine, Ribs and Pelvis - no instability, subluxation or laxity. Right Upper Extremity - no instability, subluxation or laxity.  Lymphatic  Head & Neck  General Head & Neck Lymphatics: Bilateral - Description - No Localized lymphadenopathy.  Axillary  General Axillary Region: Bilateral - Description - No Localized lymphadenopathy.  Femoral & Inguinal  Generalized Femoral & Inguinal Lymphatics: Left - Description - No Localized lymphadenopathy. Right - Description - No Localized lymphadenopathy.  Assessment & Plan [name] MD; 03/01/2019 3:43 PM)  RECURRENT RIGHT INGUINAL HERNIA (K40.91)  Impression: Obvious right inguinal hernia with prior open repair decades ago. I suspect he might have a contralateral left inguinal hernia as well. Because he is quite active and independent with many more years of life likely, and increasing symptoms from his hernia; I think he would benefit from laparoscopic exploration and repair of hernias found. He is interested in proceeding.  Okay to continue his low-dose aspirin.  He denies any history of prostatism & stable on Hytrin. Hopefully a  low chance he will have urinary retention issues with his advanced age.  Does have a history of COPD with occasional flares but is not needed oral steroids in over a year.  PREOP - ING HERNIA - ENCOUNTER FOR PREOPERATIVE EXAMINATION FOR GENERAL SURGICAL PROCEDURE (Z01.818)  Current Plans  You are being scheduled for surgery - Our schedulers will call you.  You should hear from our office's scheduling department within 5 working days about the location, date, and time of surgery. We try to make accommodations for patient's preferences in scheduling surgery, but sometimes the OR schedule or the surgeon's schedule prevents [place] from making those accommodations.  If you have not heard from our office [phone]) in 5 working days, call the office and ask for your surgeon's nurse.  If you have other questions about your diagnosis, plan, or surgery, call the office and ask for your surgeon's nurse.  Written instructions provided  The anatomy & physiology of the abdominal wall and pelvic floor was discussed. The pathophysiology of hernias in the inguinal and pelvic region was discussed. Natural history risks such as progressive enlargement, pain, incarceration, and strangulation was discussed. Contributors to complications such as smoking, obesity, diabetes, prior surgery, etc were discussed.  I feel the risks of no intervention will lead to serious problems that outweigh the operative risks; therefore, I recommended surgery to reduce and repair the hernia. I explained laparoscopic techniques with possible need for an open approach. I noted usual use of mesh to  patch and/or buttress hernia repair  Risks such as bleeding, infection, abscess, need for further treatment, heart attack, death, and other risks were discussed. I noted a good likelihood this will help address the problem. Goals of post-operative recovery were discussed as well. Possibility that this will not correct all symptoms was explained. I  stressed the importance of low-impact activity, aggressive pain control, avoiding constipation, & not pushing through pain to minimize risk of post-operative chronic pain or injury. Possibility of reherniation was discussed. We will work to minimize complications.  An educational handout further explaining the pathology & treatment options was given as well. Questions were answered. The patient expresses understanding & wishes to proceed with surgery.  Pt Education - Pamphlet Given - Laparoscopic Hernia Repair: discussed with patient and provided information.  Pt Education - CCS Pain Control (Terrye Dombrosky)  Pt Education - CCS Hernia Post-Op HCI (Sharni Negron): discussed with patient and provided information.  Pt Education - CCS Mesh education: discussed with patient and provided information.  [name], MD, FACS, MASCRS  Gastrointestinal and Minimally Invasive Surgery  [facility]  [address] [place]  [place], [place] [address]  [phone] Main / Paging  [phone] Fax

## 2019-05-06 NOTE — Progress Notes (Addendum)
PHARMACIST - PHYSICIAN ORDER COMMUNICATION  CONCERNING: P&T Medication Policy on Herbal Medications  DESCRIPTION:  This patient's order for:  ginko biloba, flaxseed oil  has been noted.  This product(s) is classified as an "herbal" or natural product. Due to a lack of definitive safety studies or FDA approval, nonstandard manufacturing practices, plus the potential risk of unknown drug-drug interactions while on inpatient medications, the Pharmacy and Therapeutics Committee does not permit the use of "herbal" or natural products of this type within [facility].   ACTION TAKEN: The pharmacy department is unable to verify this order at this time and your patient has been informed of this safety policy. Please reevaluate patient's clinical condition at discharge and address if the herbal or natural product(s) should be resumed at that time.  Peggyann Juba, PharmD, BCPS Pharmacy: [phone] 05/06/2019 4:24 PM

## 2019-05-06 NOTE — Addendum Note (Signed)
Addendum  created 05/06/19 1439 by Murvin Natal, MD   Clinical Note Signed

## 2019-05-06 NOTE — Anesthesia Postprocedure Evaluation (Signed)
Anesthesia Post Note  Patient: ELRY SLUYTER  Procedure(s) Performed: LAPAROSCOPIC RIGHT AND LEFT INGUINAL HERNIA(S) REPAIR (N/A Abdomen)     Patient location during evaluation: PACU Anesthesia Type: General Level of consciousness: awake and alert Pain management: pain level controlled Vital Signs Assessment: post-procedure vital signs reviewed and stable Respiratory status: spontaneous breathing, nonlabored ventilation, respiratory function stable and patient connected to nasal cannula oxygen Cardiovascular status: blood pressure returned to baseline and stable Postop Assessment: no apparent nausea or vomiting Anesthetic complications: no Comments: Patient having difficulty maintaining oxygen saturation while in Phase II. Patient given bronchodilators and IS. Patient in no distress, but sating around 91% on nasal cannula. Patient also reports difficulty walking due to left leg weakness. Post-op course discussed with patient and surgeon. Will admit patient to [facility] as an inpatient.     Last Vitals:  Vitals:   05/06/19 1340 05/06/19 1345  BP:    Pulse:    Resp:    Temp:    SpO2: (!) 69% (!) 85%    Last Pain:  Vitals:   05/06/19 1130  TempSrc:   PainSc: 0-No pain                 Ryan P Ellender

## 2019-05-06 NOTE — Anesthesia Postprocedure Evaluation (Signed)
Anesthesia Post Note  Patient: Johnathan [name]  Procedure(s) Performed: LAPAROSCOPIC RIGHT AND LEFT INGUINAL HERNIA(S) REPAIR (N/A Abdomen)     Patient location during evaluation: PACU Anesthesia Type: General Level of consciousness: awake Pain management: pain level controlled Vital Signs Assessment: post-procedure vital signs reviewed and stable Respiratory status: spontaneous breathing, nonlabored ventilation, respiratory function stable and patient connected to nasal cannula oxygen Cardiovascular status: blood pressure returned to baseline and stable Postop Assessment: no apparent nausea or vomiting Anesthetic complications: no    Last Vitals:  Vitals:   05/06/19 1100 05/06/19 1130  BP: 119/63 124/66  Pulse: 65 69  Resp: 15 16  Temp:  (!) 36.3 C  SpO2: 98% 96%    Last Pain:  Vitals:   05/06/19 1130  TempSrc:   PainSc: 0-No pain                 Alonia Dibuono P Annalis Kaczmarczyk

## 2019-05-06 NOTE — Interval H&P Note (Signed)
History and Physical Interval Note:  05/06/2019 7:25 AM  Johnathan [name]  has presented today for surgery, with the diagnosis of RIGHT AND POSSIBLE LEFT INGUINAL HERNIA(S) REPAIR.  The various methods of treatment have been discussed with the patient and family. After consideration of risks, benefits and other options for treatment, the patient has consented to  Procedure(s): LAPAROSCOPIC RIGHT AND POSSIBLE LEFT INGUINAL HERNIA(S) REPAIR (N/A) as a surgical intervention.  The patient's history has been reviewed, patient examined, no change in status, stable for surgery.  I have reviewed the patient's chart and labs.  Questions were answered to the patient's satisfaction.    I have re-reviewed the the patient's records, history, medications, and allergies.  I have re-examined the patient.  I again discussed intraoperative plans and goals of post-operative recovery.  The patient agrees to proceed.  Johnathan [name]  Mar 03, 1932 [id number]  Patient Care Team: [name], MD as PCP - General (Internal Medicine) O'Neal, Cassie Freer, MD as PCP - Cardiology (Cardiology) Michael Boston, MD as Consulting Physician (General Surgery)  Patient Active Problem List   Diagnosis Date Noted   Recurrent right inguinal hernia 03/01/2019   Diabetes mellitus ([place]) 03/01/2019   Pneumonia 04/21/2014   COPD (chronic obstructive pulmonary disease) ([place]) 04/21/2014    Past Medical History:  Diagnosis Date   COPD (chronic obstructive pulmonary disease) ([place])    Diverticulosis    DM type 2 (diabetes mellitus, type 2) ([place])    GERD (gastroesophageal reflux disease)    uses prilosec    Hypertension    Pneumonia 2018   Raynaud's disease    Shortness of breath dyspnea    WITH EXERTION   Sinusitis    Tremors of nervous system     Past Surgical History:  Procedure Laterality Date   COLONOSCOPY  03/27/2005   HERNIA REPAIR      Social History   Socioeconomic History   Marital status: Single    Spouse name:  Not on file   Number of children: Not on file   Years of education: Not on file   Highest education level: Not on file  Occupational History   Not on file  Tobacco Use   Smoking status: Former Smoker    Packs/day: 1.00    Years: 45.00    Pack years: 45.00    Types: Cigarettes    Quit date: 01/21/1994    Years since quitting: 25.3   Smokeless tobacco: Never Used  Substance and Sexual Activity   Alcohol use: Yes    Comment: a drink daily with dinner   Drug use: No   Sexual activity: Not on file  Other Topics Concern   Not on file  Social History Narrative   Not on file   Social Determinants of Health   Financial Resource Strain:    Difficulty of Paying Living Expenses:   Food Insecurity:    Worried About Charity fundraiser in the Last Year:    Arboriculturist in the Last Year:   Transportation Needs:    Film/video editor (Medical):    Lack of Transportation (Non-Medical):   Physical Activity:    Days of Exercise per Week:    Minutes of Exercise per Session:   Stress:    Feeling of Stress :   Social Connections:    Frequency of Communication with Friends and Family:    Frequency of Social Gatherings with Friends and Family:    Attends Religious Services:  Active Member of Clubs or Organizations:    Attends Music therapist:    Marital Status:   Intimate Partner Violence:    Fear of Current or Ex-Partner:    Emotionally Abused:    Physically Abused:    Sexually Abused:     Family History  Problem Relation Age of Onset   Dementia Brother    Diabetes Brother    Heart attack Father    Dementia Father     Medications Prior to Admission  Medication Sig Dispense Refill Last Dose   amLODipine (NORVASC) 5 MG tablet daily.   05/06/2019 at 0400   aspirin EC 81 MG tablet Take 81 mg by mouth daily.   Past Month at Unknown time   budesonide-formoterol (SYMBICORT) 160-4.5 MCG/ACT inhaler Inhale 2 puffs into the lungs 2 (two) times daily. 1 Inhaler 12  05/06/2019 at 0400   Cyanocobalamin (B-12) 1000 MCG CAPS Take 1 capsule by mouth daily.   05/05/2019 at Unknown time   Flaxseed, Linseed, (FLAXSEED OIL) 1200 MG CAPS Take 1 capsule by mouth daily.   05/05/2019 at Unknown time   Ginkgo Biloba EXTR Take 1 tablet by mouth daily.   05/05/2019 at Unknown time   metFORMIN (GLUCOPHAGE) 500 MG tablet Take 1 tablet by mouth daily. 1/2 tab daily   05/05/2019 at Unknown time   Misc Natural Products (HIMALAYAN GOJI PO) Take 1 tablet by mouth in the morning and at bedtime.    05/05/2019 at Unknown time   Multiple Vitamin (MULTIVITAMIN WITH MINERALS) TABS tablet Take 1 tablet by mouth daily.   05/05/2019 at Unknown time   omeprazole (PRILOSEC) 20 MG capsule Take 20 mg by mouth 2 (two) times daily.   05/06/2019 at 0400   pravastatin (PRAVACHOL) 40 MG tablet Take 40 mg by mouth daily. Takes 1/2 pill daily   05/06/2019 at 0400   primidone (MYSOLINE) 50 MG tablet Take 50 mg by mouth 2 (two) times daily.   05/06/2019 at 0400   terazosin (HYTRIN) 1 MG capsule Take 1 capsule by mouth 2 (two) times daily.   05/05/2019 at Unknown time   Tetrahydrozoline HCl (VISINE OP) Apply 1 drop to eye daily.   05/06/2019 at 0400   albuterol (PROVENTIL HFA;VENTOLIN HFA) 108 (90 Base) MCG/ACT inhaler Inhale 2 puffs into the lungs every 4 (four) hours as needed for wheezing or shortness of breath (or coughing). 1 Inhaler 0 Unknown at Unknown time    Current Facility-Administered Medications  Medication Dose Route Frequency Provider Last Rate Last Admin   bupivacaine liposome (EXPAREL) 1.3 % injection 266 mg  20 mL Infiltration Once Michael Boston, MD       ceFAZolin (ANCEF) IVPB 2g/100 mL premix  2 g Intravenous On Call to OR Michael Boston, MD       Chlorhexidine Gluconate Cloth 2 % PADS 6 each  6 each Topical Once Michael Boston, MD       And   Chlorhexidine Gluconate Cloth 2 % PADS 6 each  6 each Topical Once Michael Boston, MD       lactated ringers infusion   Intravenous Continuous Myrtie Soman, MD       lactated ringers infusion   Intravenous Continuous Ellender, Karyl Kinnier, MD 50 mL/hr at 05/06/19 0653 New Bag at 05/06/19 0653     No Known Allergies  BP (!) 147/73   Pulse 70   Temp 97.8 F (36.6 C) (Oral)   Resp 16   Ht 5\' 10"  (1.778 m)  Wt 69.4 kg   SpO2 97%   BMI 21.94 kg/m   Labs: Results for orders placed or performed during the hospital encounter of 05/06/19 (from the past 48 hour(s))  I-STAT, chem 8     Status: Abnormal   Collection Time: 05/06/19  6:58 AM  Result Value Ref Range   Sodium 143 135 - 145 mmol/L   Potassium 3.9 3.5 - 5.1 mmol/L   Chloride 104 98 - 111 mmol/L   BUN 17 8 - 23 mg/dL   Creatinine, Ser 0.90 0.61 - 1.24 mg/dL   Glucose, Bld 114 (H) 70 - 99 mg/dL    Comment: Glucose reference range applies only to samples taken after fasting for at least 8 hours.   Calcium, Ion 1.23 1.15 - 1.40 mmol/L   TCO2 30 22 - 32 mmol/L   Hemoglobin 13.3 13.0 - 17.0 g/dL   HCT 39.0 39.0 - 52.0 %    Imaging / Studies: No results found.   [name], M.D., F.A.C.S. Gastrointestinal and Minimally Invasive Surgery [facility] [address] [place], [place] [address] [phone] Main / Paging  05/06/2019 7:25 AM    [name]

## 2019-05-06 NOTE — Progress Notes (Signed)
Johnathan [name]  1932-07-08 [id number]  Patient Care Team: [name], MD as PCP - General (Internal Medicine) O'Neal, Cassie Freer, MD as PCP - Cardiology (Cardiology) Michael Boston, MD as Consulting Physician (General Surgery)  This patient is a 86 y.o.male who calls today for surgical evaluation.   Patient sitting up in chair.  Denies any nausea.  Mild hypoxia treated with inhalers.  Feels slightly weak with his left leg.  Most rightly related to tap and local block.  Will observe overnight.  Have physical therapy evaluate.  Most likely temporary issue.  Keep on oxygen.  Try and keep it so sats are 88 to 94% given his history of COPD.  Patient feels more comfortable with this plan as well.  Patient Active Problem List   Diagnosis Date Noted  . Left inguinal hernia s/p lap repair w mesh 05/06/2019 05/06/2019  . Recurrent right inguinal hernia s/p lap repair w mesh 05/06/2019 03/01/2019  . Diabetes mellitus ([place]) 03/01/2019  . Pneumonia 04/21/2014  . COPD (chronic obstructive pulmonary disease) ([place]) 04/21/2014    Past Medical History:  Diagnosis Date  . COPD (chronic obstructive pulmonary disease) ([place])   . Diverticulosis   . DM type 2 (diabetes mellitus, type 2) ([place])   . GERD (gastroesophageal reflux disease)    uses prilosec   . Hypertension   . Pneumonia 2018  . Raynaud's disease   . Shortness of breath dyspnea    WITH EXERTION  . Sinusitis   . Tremors of nervous system     Past Surgical History:  Procedure Laterality Date  . COLONOSCOPY  03/27/2005  . HERNIA REPAIR      Social History   Socioeconomic History  . Marital status: Single    Spouse name: Not on file  . Number of children: Not on file  . Years of education: Not on file  . Highest education level: Not on file  Occupational History  . Not on file  Tobacco Use  . Smoking status: Former Smoker    Packs/day: 1.00    Years: 45.00    Pack years: 45.00    Types: Cigarettes    Quit date: 01/21/1994     Years since quitting: 25.3  . Smokeless tobacco: Never Used  Substance and Sexual Activity  . Alcohol use: Yes    Comment: a drink daily with dinner  . Drug use: No  . Sexual activity: Not on file  Other Topics Concern  . Not on file  Social History Narrative  . Not on file   Social Determinants of Health   Financial Resource Strain:   . Difficulty of Paying Living Expenses:   Food Insecurity:   . Worried About Charity fundraiser in the Last Year:   . Arboriculturist in the Last Year:   Transportation Needs:   . Film/video editor (Medical):   Marland Kitchen Lack of Transportation (Non-Medical):   Physical Activity:   . Days of Exercise per Week:   . Minutes of Exercise per Session:   Stress:   . Feeling of Stress :   Social Connections:   . Frequency of Communication with Friends and Family:   . Frequency of Social Gatherings with Friends and Family:   . Attends Religious Services:   . Active Member of Clubs or Organizations:   . Attends Archivist Meetings:   Marland Kitchen Marital Status:   Intimate Partner Violence:   . Fear of Current or Ex-Partner:   . Emotionally  Abused:   Marland Kitchen Physically Abused:   . Sexually Abused:     Family History  Problem Relation Age of Onset  . Dementia Brother   . Diabetes Brother   . Heart attack Father   . Dementia Father     Current Facility-Administered Medications  Medication Dose Route Frequency Provider Last Rate Last Admin  . bupivacaine liposome (EXPAREL) 1.3 % injection 266 mg  20 mL Infiltration Once Michael Boston, MD      . Chlorhexidine Gluconate Cloth 2 % PADS 6 each  6 each Topical Once Michael Boston, MD       And  . Chlorhexidine Gluconate Cloth 2 % PADS 6 each  6 each Topical Once Michael Boston, MD      . fentaNYL (SUBLIMAZE) injection 25-50 mcg  25-50 mcg Intravenous Q5 min PRN Ellender, Karyl Kinnier, MD      . lactated ringers infusion   Intravenous Continuous Myrtie Soman, MD      . lactated ringers infusion   Intravenous  Continuous Ellender, Karyl Kinnier, MD 50 mL/hr at 05/06/19 1244 New Bag at 05/06/19 1244  . ondansetron (ZOFRAN) injection 4 mg  4 mg Intravenous Once PRN Ellender, Karyl Kinnier, MD         No Known Allergies  BP 124/63 (BP Location: Right Arm)   Pulse 69   Temp (!) 97.4 F (36.3 C)   Resp 16   Ht 5\' 10"  (1.778 m)   Wt 69.4 kg   SpO2 (!) 85% Comment: while awake and talking  BMI 21.94 kg/m   No results found.  Note: This dictation was prepared with Dragon/digital dictation along with [employer]. Any transcriptional errors that result from this process are unintentional.   .[name], M.D., F.A.C.S. Gastrointestinal and Minimally Invasive Surgery [facility] [address], [place], [place] [address] [phone] Main / Paging  05/06/2019 2:20 PM

## 2019-05-06 NOTE — Transfer of Care (Signed)
Immediate Anesthesia Transfer of Care Note  Patient: Johnathan [name]  Procedure(s) Performed: LAPAROSCOPIC RIGHT AND LEFT INGUINAL HERNIA(S) REPAIR (N/A Abdomen)  Patient Location: PACU  Anesthesia Type:General  Level of Consciousness: awake, alert  and oriented  Airway & Oxygen Therapy: Patient Spontanous Breathing and Patient connected to nasal cannula oxygen  Post-op Assessment: Report given to RN and Post -op Vital signs reviewed and stable  Post vital signs: Reviewed and stable  Last Vitals:  Vitals Value Taken Time  BP 105/58 05/06/19 0945  Temp    Pulse 68 05/06/19 0957  Resp 15 05/06/19 0957  SpO2 97 % 05/06/19 0957  Vitals shown include unvalidated device data.  Last Pain:  Vitals:   05/06/19 0616  TempSrc: Oral  PainSc: 0-No pain      Patients Stated Pain Goal: 5 ([id number])  Complications: No apparent anesthesia complications

## 2019-05-06 NOTE — Op Note (Addendum)
05/06/2019  9:36 AM  PATIENT:  Johnathan [name]  86 y.o. male  Patient Care Team: [name], MD as PCP - General (Internal Medicine) O'Neal, Cassie Freer, MD as PCP - Cardiology (Cardiology) Michael Boston, MD as Consulting Physician (General Surgery)  PRE-OPERATIVE DIAGNOSIS:  RIGHT RECURRENT INGUINAL HERNIA POSSIBLE LEFT INGUINAL HERNIA  POST-OPERATIVE DIAGNOSIS:  RIGHT RECURRENT INGUINAL HERNIA LEFT INGUINAL HERNIA  PROCEDURE: LAPAROSCOPIC BILATERAL INGUINAL HERNIA REPAIRS  SURGEON:  [name], MD  ASSISTANT: Fran Lowes, PA-S, [employer]  ANESTHESIA:     Regional ilioinguinal and genitofemoral and spermatic cord nerve blocks  General  Nerve block provided with liposomal bupivacaine (Experel) mixed with 0.25% bupivacaine as a Bilateral TAP block x 51mL each side at the level of the transverse abdominis & preperitoneal spaces along the flank at the anterior axillary line, from subcostal ridge to iliac crest under laparoscopic guidance    EBL:  Total I/O In: 1000 [I.V.:1000] Out: - .  See anesthesia record  Delay start of Pharmacological VTE agent (>24hrs) due to surgical blood loss or risk of bleeding:  no  DRAINS: NONE  SPECIMEN:  Cord lipomas (not sent)  DISPOSITION OF SPECIMEN:  N/A  COUNTS:  YES  PLAN OF CARE: Discharge to home after PACU  PATIENT DISPOSITION:  PACU - hemodynamically stable.  INDICATION: Pleasant active elderly gentleman with obvious right groin swelling and hernia.  Probable left-sided hernia on exam as well.  I recommended laparoscopic possible open exploration and repair of hernias found  The anatomy & physiology of the abdominal wall and pelvic floor was discussed.  The pathophysiology of hernias in the inguinal and pelvic region was discussed.  Natural history risks such as progressive enlargement, pain, incarceration & strangulation was discussed.   Contributors to complications such as smoking, obesity, diabetes, prior  surgery, etc were discussed.    I feel the risks of no intervention will lead to serious problems that outweigh the operative risks; therefore, I recommended surgery to reduce and repair the hernia.  I explained laparoscopic techniques with possible need for an open approach.  I noted usual use of mesh to patch and/or buttress hernia repair  Risks such as bleeding, infection, abscess, need for further treatment, heart attack, death, and other risks were discussed.  I noted a good likelihood this will help address the problem.   Goals of post-operative recovery were discussed as well.  Possibility that this will not correct all symptoms was explained.  I stressed the importance of low-impact activity, aggressive pain control, avoiding constipation, & not pushing through pain to minimize risk of post-operative chronic pain or injury. Possibility of reherniation was discussed.  We will work to minimize complications.     An educational handout further explaining the pathology & treatment options was given as well.  Questions were answered.  The patient expresses understanding & wishes to proceed with surgery.  OR FINDINGS: Large indirect inguinal hernia with some direct space laxity as well.  No definite femoral nor obturator hernia.  On the left side smaller but definite indirect inguinal hernia.  No direct space, femoral, obturator hernias.  DESCRIPTION:  The patient was identified & brought into the operating room. The patient was positioned supine with arms tucked. SCDs were active during the entire case. The patient underwent general anesthesia without any difficulty.  The abdomen was prepped and draped in a sterile fashion. The patient's bladder was emptied.  A Surgical Timeout confirmed our plan.  I made a transverse incision through  the inferior umbilical fold.  I made a small transverse nick through the anterior rectus fascia contralateral to the inguinal hernia side and placed a 0-vicryl  stitch through the fascia.  I placed a Hasson trocar into the preperitoneal plane.  Entry was clean.  We induced carbon dioxide insufflation. Camera inspection revealed no injury.  I used a 75mm angled scope to bluntly free the peritoneum off the infraumbilical anterior abdominal wall.  I created enough of a preperitoneal pocket to place 10mm ports into the right & left mid-abdomen into this preperitoneal cavity.  I focused attention on the RIGHT pelvis since that was the dominant hernia side.   I used blunt & focused sharp dissection to free the peritoneum off the flank and down to the pubic rim.  I freed the anteriolateral bladder wall off the anteriolateral pelvic wall, sparing midline attachments.   I located a swath of peritoneum going into a hernia fascial defect at the  internal ring consistent with  an indirect inguinal hernia..  I gradually freed the peritoneal hernia sac off safely and reduced it into the preperitoneal space.  I freed the peritoneum off the spermatic vessels & vas deferens.  I freed peritoneum off the retroperitoneum along the psoas muscle.  Spermatic cord lipoma was dissected away & removed.  I checked & assured hemostasis.  Because of his large kidney hernia sac and thinned out tissue I ended up doing a high ligation of hernia sac as well as peritoneal repair using laparoscopic intracorporeal suturing  I turned attention on the opposite  LEFT pelvis.  I did dissection in a similar, mirror-image fashion. The patient had an indirect inguinal hernia.  I checked & assured hemostasis.  Spermatic cord lipomas were actually of moderate size.  These were carefully freed off and removed.  Laparoscopic peritoneal hernia repair done with 2-0 Vicryl using laparoscopic intracorporeal suturing  I chose 15x15 cm sheets mesh for each side.  Because of the larger right-sided hernia I used medium weight polypropylene Bard mesh.  The left side was smaller so I chose ultra-lightweight polypropylene  mesh (Ultrapro).  I cut a single sigmoid-shaped slit ~6cm from a corner of each mesh.  I placed the meshes into the preperitoneal space & laid them as overlapping diamonds such that at the inferior points, a 6x6 cm corner flap rested in the true anterolateral pelvis, covering the obturator & femoral foramina.   I allowed the bladder to return to the pubis, this helping tuck the corners of the mesh in the anteriolateral pelvis.  The medial corners overlapped each other across midline cephalad to the pubic rim.   Given the numerous hernias of moderate size, I placed a third 15x15cm mesh in the center as a vertical diamond.  The lateral wings of the mesh overlap across the direct spaces and internal rings where the dominant hernias were.  This provided good coverage and reinforcement of the hernia repairs.  Because of the central mesh placement with good overlap, I did not place any tacks.   I held the hernia sacs cephalad & evacuated carbon dioxide.  I closed the fascia with absorbable suture.  I closed the skin using 4-0 monocryl stitch.  Sterile dressings were applied.   The patient was extubated & arrived in the PACU in stable condition..  I had discussed postoperative care with the patient in the holding area.  Instructions are written in the chart.  I discussed operative findings, updated the patient's status, discussed probable steps to recovery,  and gave postoperative recommendations to his friend, Marcelino Duster per the patient's request.  Recommendations were made.  Questions were answered.  He expressed understanding & appreciation.   [name], M.D., F.A.C.S. Gastrointestinal and Minimally Invasive Surgery [facility] [address] [place], [place] [address] [phone] Main / Paging  05/06/2019 9:36 AM

## 2019-05-06 NOTE — Progress Notes (Signed)
Patient in phase 2 ready to be discharged.  Upon taking VS, O2 sat noted to be in mid to low 80s with occasional dips into the 70s.  Pt denies SOB.  Auscultation reveals clear, but diminished breath sounds bilaterally.  Instructed patient to take deep breaths and cough while splinting abdomen.  Poor cough effort noted.  IS instructed and patient performed 6 times but could only achieve 500-750 at best.  Home Albuterol inhaler x 2 given.  Oxygen saturation increased to 90 briefly, but then remains 87-89%.    Dr Roanna Banning notified and order received to give Duo-neb and continue IS use.  May need to ambulate.  Waiting on med from pharmacy.  Will continue to monitor.

## 2019-05-06 NOTE — Anesthesia Procedure Notes (Signed)
Procedure Name: Intubation Date/Time: 05/06/2019 7:39 AM Performed by: Dione Petron D, CRNA Pre-anesthesia Checklist: Patient identified, Emergency Drugs available, Suction available and Patient being monitored Patient Re-evaluated:Patient Re-evaluated prior to induction Oxygen Delivery Method: Circle system utilized Preoxygenation: Pre-oxygenation with 100% oxygen Induction Type: IV induction Ventilation: Mask ventilation without difficulty Laryngoscope Size: Mac and 4 Grade View: Grade I Tube type: Oral Tube size: 7.5 mm Number of attempts: 1 Airway Equipment and Method: Stylet Placement Confirmation: ETT inserted through vocal cords under direct vision,  positive ETCO2 and breath sounds checked- equal and bilateral Secured at: 22 cm Tube secured with: Tape Dental Injury: Teeth and Oropharynx as per pre-operative assessment

## 2019-05-06 NOTE — Discharge Instructions (Signed)
Information for Discharge Teaching: EXPAREL (bupivacaine liposome injectable suspension)   Your surgeon gave you EXPAREL(bupivacaine) in your surgical incision to help control your pain after surgery.   EXPAREL is a local anesthetic that provides pain relief by numbing the tissue around the surgical site.  EXPAREL is designed to release pain medication over time and can control pain for up to 72 hours.  Depending on how you respond to EXPAREL, you may require less pain medication during your recovery.  Possible side effects:  Temporary loss of sensation or ability to move in the area where bupivacaine was injected.  Nausea, vomiting, constipation  Rarely, numbness and tingling in your mouth or lips, lightheadedness, or anxiety may occur.  Call your doctor right away if you think you may be experiencing any of these sensations, or if you have other questions regarding possible side effects.  Follow all other discharge instructions given to you by your surgeon or nurse. Eat a healthy diet and drink plenty of water or other fluids.  If you return to the hospital for any reason within 96 hours following the administration of EXPAREL, please inform your health care providers. Post Anesthesia Home Care Instructions  Activity: Get plenty of rest for the remainder of the day. A responsible adult should stay with you for 24 hours following the procedure.  For the next 24 hours, DO NOT: -Drive a car -Paediatric nurse -Drink alcoholic beverages -Take any medication unless instructed by your physician -Make any legal decisions or sign important papers.  Meals: Start with liquid foods such as gelatin or soup. Progress to regular foods as tolerated. Avoid greasy, spicy, heavy foods. If nausea and/or vomiting occur, drink only clear liquids until the nausea and/or vomiting subsides. Call your physician if vomiting continues.  Special Instructions/Symptoms: Your throat may feel dry or sore  from the anesthesia or the breathing tube placed in your throat during surgery. If this causes discomfort, gargle with warm salt water. The discomfort should disappear within 24 hours.  If you had a scopolamine patch placed behind your ear for the management of post- operative nausea and/or vomiting:  1. The medication in the patch is effective for 72 hours, after which it should be removed.  Wrap patch in a tissue and discard in the trash. Wash hands thoroughly with soap and water. 2. You may remove the patch earlier than 72 hours if you experience unpleasant side effects which may include dry mouth, dizziness or visual disturbances. 3. Avoid touching the patch. Wash your hands with soap and water after contact with the patch.   HERNIA REPAIR: POST OP INSTRUCTIONS  ######################################################################  EAT Gradually transition to a high fiber diet with a fiber supplement over the next few weeks after discharge.  Start with a pureed / full liquid diet (see below)  WALK Walk an hour a day.  Control your pain to do that.    CONTROL PAIN Control pain so that you can walk, sleep, tolerate sneezing/coughing, and go up/down stairs.  HAVE A BOWEL MOVEMENT DAILY Keep your bowels regular to avoid problems.  OK to try a laxative to override constipation.  OK to use an antidairrheal to slow down diarrhea.  Call if not better after 2 tries  CALL IF YOU HAVE PROBLEMS/CONCERNS Call if you are still struggling despite following these instructions. Call if you have concerns not answered by these instructions  ######################################################################    1. DIET: Follow a light bland diet & liquids the first 24 hours after arrival  home, such as soup, liquids, starches, etc.  Be sure to drink plenty of fluids.  Quickly advance to a usual solid diet within a few days.  Avoid fast food or heavy meals as your are more likely to get nauseated  or have irregular bowels.  A low-fat, high-fiber diet for the rest of your life is ideal.   2. Take your usually prescribed home medications unless otherwise directed.  3. PAIN CONTROL: a. Pain is best controlled by a usual combination of three different methods TOGETHER: i. Ice/Heat ii. Over the counter pain medication iii. Prescription pain medication b. Most patients will experience some swelling and bruising around the hernia(s) such as the bellybutton, groins, or old incisions.  Ice packs or heating pads (30-60 minutes up to 6 times a day) will help. Use ice for the first few days to help decrease swelling and bruising, then switch to heat to help relax tight/sore spots and speed recovery.  Some people prefer to use ice alone, heat alone, alternating between ice & heat.  Experiment to what works for you.  Swelling and bruising can take several weeks to resolve.   c. It is helpful to take an over-the-counter pain medication regularly for the first few weeks.  Choose one of the following that works best for you: i. Naproxen (Aleve, etc)  Two 220mg  tabs twice a day ii. Ibuprofen (Advil, etc) Three 200mg  tabs four times a day (every meal & bedtime) iii. Acetaminophen (Tylenol, etc) 325-650mg  four times a day (every meal & bedtime) d. A  prescription for pain medication should be given to you upon discharge.  Take your pain medication as prescribed.  i. If you are having problems/concerns with the prescription medicine (does not control pain, nausea, vomiting, rash, itching, etc), please call us [phone] to see if we need to switch you to a different pain medicine that will work better for you and/or control your side effect better. ii. If you need a refill on your pain medication, please contact your pharmacy.  They will contact our office to request authorization. Prescriptions will not be filled after 5 pm or on week-ends.  4. Avoid getting constipated.  Between the surgery and the pain  medications, it is common to experience some constipation.  Increasing fluid intake and taking a fiber supplement (such as Metamucil, Citrucel, FiberCon, MiraLax, etc) 1-2 times a day regularly will usually help prevent this problem from occurring.  A mild laxative (prune juice, Milk of Magnesia, MiraLax, etc) should be taken according to package directions if there are no bowel movements after 48 hours.    5. Wash / shower every day.  You may shower over the dressings as they are waterproof.    6. Remove your waterproof bandages, skin tapes, and other bandages 5 days after surgery. You may replace a dressing/Band-Aid to cover the incision for comfort if you wish. You may leave the incisions open to air.  You may replace a dressing/Band-Aid to cover an incision for comfort if you wish.  Continue to shower over incision(s) after the dressing is off.  7. ACTIVITIES as tolerated:   a. You may resume regular (light) daily activities beginning the next day--such as daily self-care, walking, climbing stairs--gradually increasing activities as tolerated.  Control your pain so that you can walk an hour a day.  If you can walk 30 minutes without difficulty, it is safe to try more intense activity such as jogging, treadmill, bicycling, low-impact aerobics, swimming, etc. b. Save  the most intensive and strenuous activity for last such as sit-ups, heavy lifting, contact sports, etc  Refrain from any heavy lifting or straining until you are off narcotics for pain control.   c. DO NOT PUSH THROUGH PAIN.  Let pain be your guide: If it hurts to do something, don't do it.  Pain is your body warning you to avoid that activity for another week until the pain goes down. d. You may drive when you are no longer taking prescription pain medication, you can comfortably wear a seatbelt, and you can safely maneuver your car and apply brakes. e. Dennis Bast may have sexual intercourse when it is comfortable.   8. FOLLOW UP in our  office a. Please call [facility] at [phone] to set up an appointment to see your surgeon in the office for a follow-up appointment approximately 2-3 weeks after your surgery. b. Make sure that you call for this appointment the day you arrive home to insure a convenient appointment time.  9.  If you have disability of FMLA / Family leave forms, please bring the forms to the office for processing.  (do not give to your surgeon).  WHEN TO CALL us [phone]: 1. Poor pain control 2. Reactions / problems with new medications (rash/itching, nausea, etc)  3. Fever over 101.5 F (38.5 C) 4. Inability to urinate 5. Nausea and/or vomiting 6. Worsening swelling or bruising 7. Continued bleeding from incision. 8. Increased pain, redness, or drainage from the incision   The clinic staff is available to answer your questions during regular business hours (8:30am-5pm).  Please don't hesitate to call and ask to speak to one of our nurses for clinical concerns.   If you have a medical emergency, go to the nearest emergency room or call 911.  A surgeon from [facility] is always on call at the hospitals in [facility], [place], [place], [place], [place]  [address] ?  [address] [address], [place], [place]   [address] MAIN: [phone] ? TOLL FREE: [phone] ? FAX: [phone] [email or web address]

## 2019-05-07 DIAGNOSIS — E785 Hyperlipidemia, unspecified: Secondary | ICD-10-CM | POA: Diagnosis not present

## 2019-05-07 DIAGNOSIS — Z87891 Personal history of nicotine dependence: Secondary | ICD-10-CM | POA: Diagnosis not present

## 2019-05-07 DIAGNOSIS — K4091 Unilateral inguinal hernia, without obstruction or gangrene, recurrent: Secondary | ICD-10-CM | POA: Diagnosis not present

## 2019-05-07 DIAGNOSIS — J449 Chronic obstructive pulmonary disease, unspecified: Secondary | ICD-10-CM | POA: Diagnosis not present

## 2019-05-07 DIAGNOSIS — D176 Benign lipomatous neoplasm of spermatic cord: Secondary | ICD-10-CM | POA: Diagnosis not present

## 2019-05-07 DIAGNOSIS — K4090 Unilateral inguinal hernia, without obstruction or gangrene, not specified as recurrent: Secondary | ICD-10-CM | POA: Diagnosis not present

## 2019-05-07 DIAGNOSIS — E119 Type 2 diabetes mellitus without complications: Secondary | ICD-10-CM | POA: Diagnosis not present

## 2019-05-07 DIAGNOSIS — I10 Essential (primary) hypertension: Secondary | ICD-10-CM | POA: Diagnosis not present

## 2019-05-07 DIAGNOSIS — R0602 Shortness of breath: Secondary | ICD-10-CM | POA: Diagnosis not present

## 2019-05-07 LAB — GLUCOSE, CAPILLARY
Glucose-Capillary: 95 mg/dL (ref 70–99)
Glucose-Capillary: 93 mg/dL (ref 70–99)

## 2019-05-07 MED ORDER — POLYETHYLENE GLYCOL 3350 17 G PO PACK
34.0000 g | PACK | Freq: Once | ORAL | Status: AC
  Administered 2019-05-07: 34 g via ORAL
  Filled 2019-05-07: qty 2

## 2019-05-07 NOTE — Evaluation (Addendum)
Physical Therapy Evaluation Patient Details Name: Johnathan [name] MRN: [id number] DOB: Nov 06, 1932 Today's Date: 05/07/2019   History of Present Illness  Pt is s/p LAPAROSCOPIC BILATERAL INGUINAL HERNIA REPAIRS. PMH includes h/o mild COPD stable on inhalers, DM, HTN, Raynaud's disease.  Clinical Impression  Pt admitted as above and presenting with functional mobility limitations 2* new onset decreased L LE strength and associated balance deficits.  Pt should progress to dc home with assist of family/friends.   Follow Up Recommendations Home health PT    Equipment Recommendations  None recommended by PT    Recommendations for Other Services       Precautions / Restrictions Precautions Precautions: Fall Restrictions Weight Bearing Restrictions: No      Mobility  Bed Mobility Overal bed mobility: Needs Assistance Bed Mobility: Supine to Sit     Supine to sit: Supervision     General bed mobility comments: increased time with VC to log roll but no physical assist  Transfers Overall transfer level: Needs assistance Equipment used: Rolling walker (2 wheeled) Transfers: Sit to/from Stand Sit to Stand: Min guard         General transfer comment: cues for use of UEs to self assist.  Steady assist only.  Ambulation/Gait Ambulation/Gait assistance: Min assist;Min guard Gait Distance (Feet): 400 Feet Assistive device: Rolling walker (2 wheeled) Gait Pattern/deviations: Step-to pattern;Step-through pattern;Shuffle;Trunk flexed Gait velocity: decr   General Gait Details: cues for posture and position from [employer]            Wheelchair Mobility    Modified Rankin (Stroke Patients Only)       Balance Overall balance assessment: Needs assistance Sitting-balance support: No upper extremity supported;Feet supported Sitting balance-Leahy Scale: Good     Standing balance support: Bilateral upper extremity supported;No upper extremity supported Standing  balance-Leahy Scale: Poor Standing balance comment: fair static balance, poor dynamic balance                             Pertinent Vitals/Pain Pain Assessment: No/denies pain Faces Pain Scale: Hurts a little bit Pain Location: incisional pain "annoying" Pain Descriptors / Indicators: Sore Pain Intervention(s): Monitored during session    Home Living Family/patient expects to be discharged to:: Private residence Living Arrangements: Non-relatives/Friends Available Help at Discharge: Friend(s) Type of Home: House Home Access: Stairs to enter   Technical brewer of Steps: 1 Home Layout: Two level Home Equipment: None Additional Comments: can borrow RW from family member    Prior Function Level of Independence: Independent         Comments: works, active at baseline     Journalist, newspaper        Extremity/Trunk Assessment   Upper Extremity Assessment Upper Extremity Assessment: Overall WFL for tasks assessed    Lower Extremity Assessment Lower Extremity Assessment: LLE deficits/detail LLE Deficits / Details: 4/5 DF, 3-/5 quads - pt states this is new since surgery but has improved since yesterday post - op       Communication   Communication: No difficulties  Cognition Arousal/Alertness: Awake/alert Behavior During Therapy: WFL for tasks assessed/performed Overall Cognitive Status: Within Functional Limits for tasks assessed                                        General Comments      Exercises  Assessment/Plan    PT Assessment Patient needs continued PT services  PT Problem List Decreased strength;Decreased activity tolerance;Decreased balance;Decreased mobility;Decreased knowledge of use of DME       PT Treatment Interventions DME instruction;Gait training;Stair training;Functional mobility training;Therapeutic activities;Therapeutic exercise;Patient/family education    PT Goals (Current goals can be found in the  Care Plan section)  Acute Rehab PT Goals Patient Stated Goal: home and return to work in 3 weeks PT Goal Formulation: With patient Time For Goal Achievement: 05/14/19 Potential to Achieve Goals: Good    Frequency Min 3X/week   Barriers to discharge        Co-evaluation               AM-PAC PT "6 Clicks" Mobility  Outcome Measure Help needed turning from your back to your side while in a flat bed without using bedrails?: None Help needed moving from lying on your back to sitting on the side of a flat bed without using bedrails?: None Help needed moving to and from a bed to a chair (including a wheelchair)?: A Little Help needed standing up from a chair using your arms (e.g., wheelchair or bedside chair)?: A Little Help needed to walk in hospital room?: A Little Help needed climbing 3-5 steps with a railing? : A Lot 6 Click Score: 19    End of Session Equipment Utilized During Treatment: Gait belt Activity Tolerance: Patient tolerated treatment well Patient left: in chair;with call bell/phone within reach;with chair alarm set Nurse Communication: Mobility status PT Visit Diagnosis: Unsteadiness on feet (R26.81);Muscle weakness (generalized) (M62.81);Difficulty in walking, not elsewhere classified (R26.2)    Time: [id number] PT Time Calculation (min) (ACUTE ONLY): 34 min   Charges:   PT Evaluation $PT Eval Low Complexity: 1 Low PT Treatments $Gait Training: 8-22 mins        [place] Pager [phone] Office [phone]    Joanathan Affeldt 05/07/2019, 12:00 PM

## 2019-05-07 NOTE — TOC Initial Note (Signed)
Transition of Care [facility]) - Initial/Assessment Note    Patient Details  Name: Johnathan [name] MRN: [id number] Date of Birth: 01/05/1933  Transition [facility]) CM/SW Contact:    Lia Hopping, [place] Phone Number: 05/07/2019, 1:10 PM  Clinical Narrative:                 CSW met with the patient at beside. Patient Friend Elberta Fortis was present. CSW discussed patient discharge plan with home health for PT, OT , RN. Patient informed the patient RN will assist with teaching a caregiver and will not see him daily. Patient report his Ashok Cordia will assist with his wound care. Patient agreeable to PT/OT even though he feels like he can do without. CSW explain Dr. Johney Maine ordered it for a health safety visit and for strengthening. Patient report understanding. CSW offered choice and presented list from [email or web address]. Patient preferred higher rated agency in the are per medicare ratings. Patient agreeable to [facility].   CSW confirm staffing availability, information written on AVS.  No DME needs.  No other needs identified. CSW signing off.    Expected Discharge Plan: [place] Barriers to Discharge: Barriers Resolved   Patient Goals and CMS Choice   CMS Medicare.gov Compare Post Acute Care list provided to:: Patient Choice offered to / list presented to : Patient  Expected Discharge Plan and Services Expected Discharge Plan: [place] Choice: [place] arrangements for the past 2 months: Apartment Expected Discharge Date: 05/07/19               DME Arranged: N/A DME Agency: NA       HH Arranged: PT, OT, RN [place] Agency: [place] Date [facility] Agency Contacted: 05/07/19 Time HH Agency Contacted: 82 Representative spoke with at [place]: [place] Arrangements/Services Living arrangements for the past 2 months: [place] with:: Self Patient language and need for interpreter  reviewed:: No Do you feel safe going back to the place where you live?: Yes      Need for Family Participation in Patient Care: Yes (Comment) Care giver support system in place?: Yes (comment) Current home services: DME Criminal Activity/Legal Involvement Pertinent to Current Situation/Hospitalization: No - Comment as needed  Activities of Daily Living Home Assistive Devices/Equipment: Eyeglasses, Dentures (specify type), Cane (specify quad or straight), Grab bars in shower ADL Screening (condition at time of admission) Patient's cognitive ability adequate to safely complete daily activities?: Yes Is the patient deaf or have difficulty hearing?: Yes Does the patient have difficulty seeing, even when wearing glasses/contacts?: No Does the patient have difficulty concentrating, remembering, or making decisions?: No Patient able to express need for assistance with ADLs?: Yes Does the patient have difficulty dressing or bathing?: No Independently performs ADLs?: Yes (appropriate for developmental age) Does the patient have difficulty walking or climbing stairs?: Yes Weakness of Legs: Left Weakness of Arms/Hands: None  Permission Sought/Granted Permission sought to share information with : Case Manager Permission granted to share information with : Yes, Verbal Permission Granted  Share Information with NAME: Anthony,Meyer  Permission granted to share info w AGENCY: Home Health agency  Permission granted to share info w Relationship: Friend  Permission granted to share info w Contact Information: [phone]  Emotional Assessment Appearance:: Appears stated age Attitude/Demeanor/Rapport: Engaged Affect (typically observed): Accepting, Pleasant Orientation: : Oriented to Self, Oriented to Place, Oriented to  Time, Oriented  to Situation Alcohol / Substance Use: Not Applicable Psych Involvement: No (comment)  Admission diagnosis:  COPD (chronic obstructive pulmonary disease) ([place])  [J44.9] Patient Active Problem List   Diagnosis Date Noted  . Left inguinal hernia s/p lap repair w mesh 05/06/2019 05/06/2019  . Recurrent right inguinal hernia s/p lap repair w mesh 05/06/2019 03/01/2019  . Diabetes mellitus ([place]) 03/01/2019  . Pneumonia 04/21/2014  . COPD (chronic obstructive pulmonary disease) ([place]) 04/21/2014   PCP:  [name], MD Pharmacy:   [facility] [address] [place] - [address] [place] [address] Phone: [phone] Fax: [phone]     Social Determinants of Health (SDOH) Interventions    Readmission Risk Interventions No flowsheet data found.

## 2019-05-07 NOTE — Progress Notes (Signed)
Physical Therapy Treatment Patient Details Name: Johnathan [name] MRN: [id number] DOB: [birth year] Today's Date: 05/07/2019    History of Present Illness Pt is s/p LAPAROSCOPIC BILATERAL INGUINAL HERNIA REPAIRS. PMH includes h/o mild COPD stable on inhalers, DM, HTN, Raynaud's disease.    PT Comments    Pt continues motivated and with noted improvement in L quad strength(now 4/5).  Pt ambulating in hall and progressed to Sup level with RW and min assist with SPC.  Pt negotiated stairs with min assist.  Friend in room that states can supply pt with RW and SPC.   Follow Up Recommendations  Home health PT     Equipment Recommendations  None recommended by PT    Recommendations for Other Services       Precautions / Restrictions Precautions Precautions: Fall Restrictions Weight Bearing Restrictions: No    Mobility  Bed Mobility Overal bed mobility: Needs Assistance Bed Mobility: Supine to Sit     Supine to sit: Supervision     General bed mobility comments: Pt up in chair and requests back to same  Transfers Overall transfer level: Needs assistance Equipment used: Rolling walker (2 wheeled) Transfers: Sit to/from Stand Sit to Stand: Supervision         General transfer comment: cues for use of UEs to self assist  Ambulation/Gait Ambulation/Gait assistance: Min assist;Min guard;Supervision Gait Distance (Feet): 300 Feet Assistive device: Rolling walker (2 wheeled);Straight cane Gait Pattern/deviations: Step-to pattern;Step-through pattern;Shuffle;Trunk flexed Gait velocity: decr   General Gait Details: cues for posture and postion from RW.  Pt ambulated 150' with RW and min guard assist and additional 150' with SPC and min assist   Stairs Stairs: Yes Stairs assistance: Min assist Stair Management: No rails;One rail Right;Step to pattern;Forwards;With walker;With cane Number of Stairs: 9 General stair comments: 5 steps fwd with rail and cane and 4 single steps  fwd with RW; Cues for sequence and foot/cane/RW placement   Wheelchair Mobility    Modified Rankin (Stroke Patients Only)       Balance Overall balance assessment: Needs assistance Sitting-balance support: No upper extremity supported;Feet supported Sitting balance-Leahy Scale: Good     Standing balance support: No upper extremity supported Standing balance-Leahy Scale: Fair Standing balance comment: fair static balance, poor dynamic balance                            Cognition Arousal/Alertness: Awake/alert Behavior During Therapy: WFL for tasks assessed/performed Overall Cognitive Status: Within Functional Limits for tasks assessed                                        Exercises      General Comments        Pertinent Vitals/Pain Pain Assessment: Faces Faces Pain Scale: Hurts little more Pain Location: abdominal pain with laughter Pain Descriptors / Indicators: Grimacing;Guarding Pain Intervention(s): Limited activity within patient's tolerance;Monitored during session    Home Living Family/patient expects to be discharged to:: Private residence Living Arrangements: Non-relatives/Friends Available Help at Discharge: Friend(s) Type of Home: House Home Access: Stairs to enter   Home Layout: Two level Home Equipment: None Additional Comments: can borrow RW from family member    Prior Function Level of Independence: Independent      Comments: works, active at baseline   PT Goals (current goals can now be found in the care  plan section) Acute Rehab PT Goals Patient Stated Goal: home and return to work in 3 weeks PT Goal Formulation: With patient Time For Goal Achievement: 05/14/19 Potential to Achieve Goals: Good Progress towards PT goals: Progressing toward goals    Frequency    Min 3X/week      PT Plan Current plan remains appropriate    Co-evaluation              AM-PAC PT "6 Clicks" Mobility   Outcome  Measure  Help needed turning from your back to your side while in a flat bed without using bedrails?: None Help needed moving from lying on your back to sitting on the side of a flat bed without using bedrails?: None Help needed moving to and from a bed to a chair (including a wheelchair)?: A Little Help needed standing up from a chair using your arms (e.g., wheelchair or bedside chair)?: A Little Help needed to walk in hospital room?: A Little Help needed climbing 3-5 steps with a railing? : A Little 6 Click Score: 20    End of Session Equipment Utilized During Treatment: Gait belt Activity Tolerance: Patient tolerated treatment well Patient left: in chair;with call bell/phone within reach;with chair alarm set;with family/visitor present Nurse Communication: Mobility status PT Visit Diagnosis: Unsteadiness on feet (R26.81);Muscle weakness (generalized) (M62.81);Difficulty in walking, not elsewhere classified (R26.2)     Time: [id number] PT Time Calculation (min) (ACUTE ONLY): 21 min  Charges:  $Gait Training: 8-22 mins                     [place] Pager [phone] Office [phone]    Danthony Kendrix 05/07/2019, 2:39 PM

## 2019-05-07 NOTE — Progress Notes (Signed)
Johnathan [name] [id number] Oct 03, 1932  CARE TEAM:  PCP: [name], MD  Outpatient Care Team: Patient Care Team: [name], MD as PCP - General (Internal Medicine) O'Neal, Cassie Freer, MD as PCP - Cardiology (Cardiology) Michael Boston, MD as Consulting Physician (General Surgery)  Inpatient Treatment Team: Treatment Team: Attending Provider: Michael Boston, MD; Registered Nurse: Vella Redhead, RN; Registered Nurse: Arminda Resides, RN; Occupational Therapist: Hortencia Pilar, OT; Utilization Review: Sindy Guadeloupe, RN; Physical Therapist: Mathis Fare, PT   Problem List:   Principal Problem:   Recurrent right inguinal hernia s/p lap repair w mesh 05/06/2019 Active Problems:   COPD (chronic obstructive pulmonary disease) ([place])   Left inguinal hernia s/p lap repair w mesh 05/06/2019   1 Day Post-Op  05/06/2019  Procedure(s): LAPAROSCOPIC RIGHT AND LEFT INGUINAL HERNIA(S) REPAIR    Assessment  Bilateral inguinal hernias COPD  [facility] Stay = 0 days)  Plan:  -trial d/c oxygen therapy, goal O2 sats between 88-94% -Miralax BID -PT/OT evaluation -Advance diet per protocol -d/c IVF -VTE prophylaxis- SCDs, etc -mobilize as tolerated to help recovery  30 minutes spent in review, evaluation, examination, counseling, and coordination of care.  More than 50% of that time was spent in counseling.  05/07/2019    Subjective: Bilateral inguinal hernias- POD1  Patient is overall feeling well, sitting up in bed. He is concerned about left leg weakness, no change since yesterday evening. Able to pass flatus and urinate. No bowel movements yet. Denies worsening shortness of breath and remained on oxygen therapy through the night.    Objective:  Vital signs:  Vitals:   05/06/19 1753 05/06/19 1956 05/06/19 2112 05/07/19 0607  BP: (!) 147/77  (!) 156/70 (!) 151/80  Pulse: 78  75 66  Resp: 16  18 18   Temp: (!) 97.4 F (36.3 C)  97.7 F (36.5 C) (!) 97.5  F (36.4 C)  TempSrc: Axillary  Oral Oral  SpO2: 99% 94% 97% 97%  Weight:      Height:           Intake/Output   Yesterday:  04/15 0701 - 04/16 0700 In: [id number] [P.O.:1280; I.V.:2000] Out: 1975 [Urine:1975] This shift:  Total I/O In: -  Out: 400 [Urine:400]  Bowel function:  Flatus: YES  BM:  No  Drain: (No drain)   Physical Exam:  General: Pt awake/alert in no acute distress Eyes: PERRL, normal EOM.  Sclera clear.  No icterus Neuro: CN II-XII intact w/o focal sensory/motor deficits. Lymph: No head/neck/groin lymphadenopathy Psych:  No delerium/psychosis/paranoia.  Oriented x 4 HENT: Normocephalic, Mucus membranes moist.  No thrush Neck: Supple, No tracheal deviation.  No obvious thyromegaly Chest: No pain to chest wall compression.  Good respiratory excursion.  No audible wheezing CV:  Pulses intact.  Regular rhythm.  No major extremity edema MS: Normal AROM mjr joints.  No obvious deformity. 4/5 strength LLE. 5/5 strength RLE.  Abdomen: Soft.  Nondistended.  Nontender.  No evidence of peritonitis.  No incarcerated hernias.  Ext:  No deformity.  No mjr edema.  No cyanosis Skin: No petechiae / purpurea.  No major sores.  Warm and dry    Results:   Cultures: Recent Results (from the past 720 hour(s))  SARS CORONAVIRUS 2 (TAT 6-24 HRS) Nasopharyngeal Nasopharyngeal Swab     Status: None   Collection Time: 05/03/19  1:50 PM   Specimen: Nasopharyngeal Swab  Result Value Ref Range Status   SARS Coronavirus 2 NEGATIVE NEGATIVE Final  Comment: (NOTE) SARS-CoV-2 target nucleic acids are NOT DETECTED. The SARS-CoV-2 RNA is generally detectable in upper and lower respiratory specimens during the acute phase of infection. Negative results do not preclude SARS-CoV-2 infection, do not rule out co-infections with other pathogens, and should not be used as the sole basis for treatment or other patient management decisions. Negative results must be combined with clinical  observations, patient history, and epidemiological information. The expected result is Negative. Fact Sheet for Patients: SugarRoll.be Fact Sheet for Healthcare Providers: https://www.woods-mathews.com/ This test is not yet approved or cleared by the [place] FDA and  has been authorized for detection and/or diagnosis of SARS-CoV-2 by FDA under an Emergency Use Authorization (EUA). This EUA will remain  in effect (meaning this test can be used) for the duration of the COVID-19 declaration under Section 56 4(b)(1) of the Act, 21 U.S.C. section 360bbb-3(b)(1), unless the authorization is terminated or revoked sooner. Performed at [facility], [place] [address], [place], [place] [address]     Labs: Results for orders placed or performed during the hospital encounter of 05/06/19 (from the past 48 hour(s))  I-STAT, chem 8     Status: Abnormal   Collection Time: 05/06/19  6:58 AM  Result Value Ref Range   Sodium 143 135 - 145 mmol/L   Potassium 3.9 3.5 - 5.1 mmol/L   Chloride 104 98 - 111 mmol/L   BUN 17 8 - 23 mg/dL   Creatinine, Ser 0.90 0.61 - 1.24 mg/dL   Glucose, Bld 114 (H) 70 - 99 mg/dL    Comment: Glucose reference range applies only to samples taken after fasting for at least 8 hours.   Calcium, Ion 1.23 1.15 - 1.40 mmol/L   TCO2 30 22 - 32 mmol/L   Hemoglobin 13.3 13.0 - 17.0 g/dL   HCT 39.0 39.0 - 52.0 %  Glucose, capillary     Status: Abnormal   Collection Time: 05/06/19  9:57 AM  Result Value Ref Range   Glucose-Capillary 148 (H) 70 - 99 mg/dL    Comment: Glucose reference range applies only to samples taken after fasting for at least 8 hours.   Comment 1 Document in Chart   Glucose, capillary     Status: Abnormal   Collection Time: 05/06/19  2:59 PM  Result Value Ref Range   Glucose-Capillary 193 (H) 70 - 99 mg/dL    Comment: Glucose reference range applies only to samples taken after fasting for at least 8 hours.    CBC     Status: Abnormal   Collection Time: 05/06/19  4:37 PM  Result Value Ref Range   WBC 7.7 4.0 - 10.5 K/uL   RBC 4.09 (L) 4.22 - 5.81 MIL/uL   Hemoglobin 13.3 13.0 - 17.0 g/dL   HCT 41.2 39.0 - 52.0 %   MCV 100.7 (H) 80.0 - 100.0 fL   MCH 32.5 26.0 - 34.0 pg   MCHC 32.3 30.0 - 36.0 g/dL   RDW 13.5 11.5 - 15.5 %   Platelets 156 150 - 400 K/uL   nRBC 0.0 0.0 - 0.2 %    Comment: Performed at [facility], [place] [address], [place], [place] [address]  Creatinine, serum     Status: None   Collection Time: 05/06/19  4:37 PM  Result Value Ref Range   Creatinine, Ser 1.00 0.61 - 1.24 mg/dL   GFR calc non Af Amer >60 >60 mL/min   GFR calc Af Amer >60 >60 mL/min  Comment: Performed at [facility], [place] [address], [place], [place] [address]  Glucose, capillary     Status: Abnormal   Collection Time: 05/06/19  4:41 PM  Result Value Ref Range   Glucose-Capillary 203 (H) 70 - 99 mg/dL    Comment: Glucose reference range applies only to samples taken after fasting for at least 8 hours.  Glucose, capillary     Status: Abnormal   Collection Time: 05/06/19 10:24 PM  Result Value Ref Range   Glucose-Capillary 176 (H) 70 - 99 mg/dL    Comment: Glucose reference range applies only to samples taken after fasting for at least 8 hours.  Glucose, capillary     Status: None   Collection Time: 05/07/19  7:25 AM  Result Value Ref Range   Glucose-Capillary 95 70 - 99 mg/dL    Comment: Glucose reference range applies only to samples taken after fasting for at least 8 hours.    Imaging / Studies: No results found.  Medications / Allergies: per chart  Antibiotics: Anti-infectives (From admission, onward)   Start     Dose/Rate Route Frequency Ordered Stop   05/06/19 0600  ceFAZolin (ANCEF) IVPB 2g/100 mL premix     2 g 200 mL/hr over 30 Minutes Intravenous On call to O.R. 05/06/19 0544 05/06/19 0741        Note: Portions of this report may have been  transcribed using voice recognition software. Every effort was made to ensure accuracy; however, inadvertent computerized transcription errors may be present.   Any transcriptional errors that result from this process are unintentional.     Barbee Mamula Sydnee Levans PA-S, [facility] [name], MD, FACS, MASCRS Gastrointestinal and Minimally Invasive Surgery    [address], [place], [place] [address] [phone] Main / Paging [phone] Fax Please see Amion for pager number, especial 5pm - 7am.

## 2019-05-07 NOTE — Evaluation (Signed)
Occupational Therapy Evaluation Patient Details Name: Johnathan [name] MRN: [id number] DOB: [birth year] Today's Date: 05/07/2019    History of Present Illness Pt is s/p LAPAROSCOPIC BILATERAL INGUINAL HERNIA REPAIRS. PMH includes h/o mild COPD stable on inhalers, DM, HTN, Raynaud's disease.   Clinical Impression   Pt admitted with the above diagnoses and presents with below problem list. Pt will benefit from continued acute OT to address the below listed deficits and maximize independence with basic ADLs prior to d/c home. At baseline, pt is independent with ADLs and IADLs, works, and is active. Pt currently min guard A with LB ADLs, functional mobility and transfers utilizing rw this session. Discussed strategies for LB ADLs and use of 3n1 as shower seat at home.       Follow Up Recommendations  No OT follow up;Supervision - Intermittent    Equipment Recommendations  3 in 1 bedside commode    Recommendations for Other Services       Precautions / Restrictions Precautions Precautions: Fall Restrictions Weight Bearing Restrictions: No      Mobility Bed Mobility               General bed mobility comments: up in chair  Transfers Overall transfer level: Needs assistance Equipment used: Rolling walker (2 wheeled) Transfers: Sit to/from Stand Sit to Stand: Min guard         General transfer comment: min guard for safety. utilized rw "I need that if I'm going to be up on my feet." to/from recliner. good technique    Balance Overall balance assessment: Needs assistance Sitting-balance support: No upper extremity supported;Feet supported Sitting balance-Leahy Scale: Good     Standing balance support: Bilateral upper extremity supported;No upper extremity supported Standing balance-Leahy Scale: Poor Standing balance comment: fair static balance, poor dynamic balance                           ADL either performed or assessed with clinical judgement   ADL  Overall ADL's : Needs assistance/impaired Eating/Feeding: Set up;Sitting   Grooming: Supervision/safety;Standing;Wash/dry hands   Upper Body Bathing: Set up;Sitting   Lower Body Bathing: Min guard;Sit to/from stand   Upper Body Dressing : Set up;Sitting   Lower Body Dressing: Min guard;Sit to/from stand   Toilet Transfer: Min guard;RW   Toileting- Water quality scientist and Hygiene: Min guard;Sit to/from stand   Tub/ Shower Transfer: Walk-in shower;Min guard;Ambulation;Rolling walker   Functional mobility during ADLs: Min guard;Rolling walker General ADL Comments: Pt completed in room functional mobility, stood to complete 1 grooming task. Discussed strategies for LB ADLs for comfort.     Vision         Perception     Praxis      Pertinent Vitals/Pain Pain Assessment: Faces Faces Pain Scale: Hurts a little bit Pain Location: incisional pain "annoying" Pain Descriptors / Indicators: Sore Pain Intervention(s): Monitored during session     Hand Dominance     Extremity/Trunk Assessment Upper Extremity Assessment Upper Extremity Assessment: Overall WFL for tasks assessed   Lower Extremity Assessment Lower Extremity Assessment: Defer to PT evaluation       Communication Communication Communication: No difficulties   Cognition Arousal/Alertness: Awake/alert Behavior During Therapy: WFL for tasks assessed/performed Overall Cognitive Status: Within Functional Limits for tasks assessed  General Comments       Exercises     Shoulder Instructions      Home Living Family/patient expects to be discharged to:: Private residence Living Arrangements: Non-relatives/Friends Available Help at Discharge: Friend(s) Type of Home: House             Bathroom Shower/Tub: Walk-in shower         Home Equipment: None          Prior Functioning/Environment Level of Independence: Independent        Comments:  works, active at baseline        OT Problem List: Impaired balance (sitting and/or standing);Decreased knowledge of use of DME or AE;Decreased knowledge of precautions;Pain      OT Treatment/Interventions: Self-care/ADL training;DME and/or AE instruction;Therapeutic activities;Patient/family education;Balance training    OT Goals(Current goals can be found in the care plan section) Acute Rehab OT Goals Patient Stated Goal: home and return to work in 3 weeks OT Goal Formulation: With patient Time For Goal Achievement: 05/21/19 Potential to Achieve Goals: Good ADL Goals Pt Will Perform Grooming: with modified independence;standing Pt Will Perform Lower Body Dressing: with modified independence;sit to/from stand Pt Will Perform Tub/Shower Transfer: Shower transfer;with modified independence;3 in 1;rolling walker  OT Frequency: Min 2X/week   Barriers to D/C:            Co-evaluation              AM-PAC OT "6 Clicks" Daily Activity     Outcome Measure Help from another person eating meals?: None Help from another person taking care of personal grooming?: None Help from another person toileting, which includes using toliet, bedpan, or urinal?: A Little Help from another person bathing (including washing, rinsing, drying)?: A Little Help from another person to put on and taking off regular upper body clothing?: None Help from another person to put on and taking off regular lower body clothing?: A Little 6 Click Score: 21   End of Session Equipment Utilized During Treatment: Rolling walker Nurse Communication: Other (comment);Mobility status(O2 sats)  Activity Tolerance: Patient tolerated treatment well Patient left: in chair;with call bell/phone within reach;with chair alarm set;with family/visitor present  OT Visit Diagnosis: Unsteadiness on feet (R26.81);Pain                Time: [id number] OT Time Calculation (min): 18 min Charges:  OT General Charges $OT Visit: 1  Visit  Tyrone Schimke, OT Acute Rehabilitation Services Pager: [phone] Office: [phone]   Hortencia Pilar 05/07/2019, 11:46 AM

## 2019-05-10 NOTE — Discharge Summary (Signed)
Physician Discharge Summary    Patient ID: Johnathan [name] MRN: [id number] DOB/AGE: 1932-08-05  86 y.o.  Patient Care Team: [name], MD as PCP - General (Internal Medicine) O'Neal, Cassie Freer, MD as PCP - Cardiology (Cardiology) Michael Boston, MD as Consulting Physician (General Surgery)  Admit date: 05/06/2019  Discharge date: 05/07/2019 Hospital Stay = 0 days    Discharge Diagnoses:  Principal Problem:   Recurrent right inguinal hernia s/p lap repair w mesh 05/06/2019 Active Problems:   COPD (chronic obstructive pulmonary disease) ([place])   Left inguinal hernia s/p lap repair w mesh 05/06/2019   4 Days Post-Op  05/06/2019  POST-OPERATIVE DIAGNOSIS:   RIGHT AND POSSIBLE LEFT INGUINAL HERNIA(S) REPAIR  SURGERY:  05/06/2019  Procedure(s): LAPAROSCOPIC RIGHT AND LEFT INGUINAL HERNIA(S) REPAIR  SURGEON:    Surgeon(s): Michael Boston, MD  Consults: PT/OT  Hospital Course:   The patient underwent the surgery above.  He did have some mild leg weakness and hypoxia.  He was observed overnight.  Hypoxia stabilized.  He was able to ambulate adequately.  Was recommended to have home health physical therapy.  Postoperatively, the patient gradually mobilized and advanced to a solid diet.  Pain and other symptoms were treated aggressively.    By the time of discharge, the patient was walking well the hallways, eating food, having flatus.  Pain was well-controlled on an oral medications.  Based on meeting discharge criteria and continuing to recover, I felt it was safe for the patient to be discharged from the hospital to further recover with close followup. Postoperative recommendations were discussed in detail.  They are written as well.  Discharged Condition: fair  Discharge Exam: Blood pressure (!) 149/63, pulse 77, temperature (!) 97.5 F (36.4 C), temperature source Oral, resp. rate 18, height 5\' 10"  (1.778 m), weight 69.4 kg, SpO2 95 %.  General: Pt awake/alert/oriented x4  in No acute distress Eyes: PERRL, normal EOM.  Sclera clear.  No icterus Neuro: CN II-XII intact w/o focal sensory/motor deficits. Lymph: No head/neck/groin lymphadenopathy Psych:  No delerium/psychosis/paranoia HENT: Normocephalic, Mucus membranes moist.  No thrush Neck: Supple, No tracheal deviation Chest: No chest wall pain w good excursion CV:  Pulses intact.  Regular rhythm MS: Normal AROM mjr joints.  No obvious deformity Abdomen: Soft.  Nondistended.  Nontender.  No evidence of peritonitis.  No incarcerated hernias. GU:  Mild groin swelling Ext:  SCDs BLE.  No mjr edema.  No cyanosis Skin: No petechiae / purpura   Disposition:   Follow-up Information    Michael Boston, MD. Schedule an appointment as soon as possible for a visit in 3 weeks.   Specialty: General Surgery Why: To follow up after your operation Contact information: [address] [place] [address] [phone]           Discharge disposition: 01-Home or Self Care       Discharge Instructions    Call MD for:   Complete by: As directed    FEVER >101.5 F (Temperatures <101.51F occasionally happen and are not significant)   Call MD for:   Complete by: As directed    FEVER > 101.5 F  (temperatures < 101.5 F are not significant)   Call MD for:  extreme fatigue   Complete by: As directed    Call MD for:  extreme fatigue   Complete by: As directed    Call MD for:  persistant dizziness or light-headedness   Complete by: As directed  Call MD for:  persistant dizziness or light-headedness   Complete by: As directed    Call MD for:  persistant nausea and vomiting   Complete by: As directed    Call MD for:  persistant nausea and vomiting   Complete by: As directed    Call MD for:  redness, tenderness, or signs of infection (pain, swelling, redness, odor or green/yellow discharge around incision site)   Complete by: As directed    Call MD for:  redness, tenderness, or signs of  infection (pain, swelling, redness, odor or green/yellow discharge around incision site)   Complete by: As directed    Call MD for:  severe uncontrolled pain   Complete by: As directed    Call MD for:  severe uncontrolled pain   Complete by: As directed    Diet - low sodium heart healthy   Complete by: As directed    Follow a light diet the first few days at home.  Start with a bland diet such as soups, liquids, starchy foods, low fat foods, etc.   If you feel full, bloated, or constipated, stay on a full liquid or pureed/blenderized diet for a few days until you feel better and no longer constipated. Gradually get back to a regular solid diet.  Avoid fast food or heavy meals the first week as you are more likely to get nauseated.   Discharge instructions   Complete by: As directed    One the day of your discharge from the hospital (or the next business weekday), please call [facility] to set up or confirm an appointment to see your surgeon in the office for a follow-up appointment.  Usually it is 2-3 weeks after your surgery.  Other concerns If you are not getting better after two weeks or are noticing you are getting worse, contact our office [phone] for further advice.  We may need to adjust your medications, re-evaluate you in the office, send you to the emergency room, or see what other things we can do to help. The clinic staff is available to answer your questions during regular business hours (8:30am-5pm).  Please don't hesitate to call and ask to speak to one of our nurses for clinical concerns.    A surgeon from [facility] is always on call at the hospitals 24 hours/day If you have a medical emergency, go to the nearest emergency room or call 911.   Discharge instructions   Complete by: As directed    See Discharge Instructions If you are not getting better after two weeks or are noticing you are getting worse, contact our office [phone] for  further advice.  We may need to adjust your medications, re-evaluate you in the office, send you to the emergency room, or see what other things we can do to help. The clinic staff is available to answer your questions during regular business hours (8:30am-5pm).  Please don't hesitate to call and ask to speak to one of our nurses for clinical concerns.    A surgeon from [facility] is always on call at the hospitals 24 hours/day If you have a medical emergency, go to the nearest emergency room or call 911.   Discharge wound care:   Complete by: As directed    It is good for closed incisions and even open wounds to be washed every day.  Shower every day.  Short baths are fine.  Wash the incisions and wounds clean with soap &  water.    You may leave closed incisions open to air if it is dry.   You may cover the incision with clean gauze & replace it after your daily shower for comfort.   Driving Restrictions   Complete by: As directed    You may drive when you are no longer taking prescription pain medication, you can comfortably wear a seatbelt, and you can safely maneuver your car and apply brakes.   Driving Restrictions   Complete by: As directed    You may drive when: - you are no longer taking narcotic prescription pain medication - you can comfortably wear a seatbelt - you can safely make sudden turns/stops without pain.   Increase activity slowly   Complete by: As directed    Increase activity slowly   Complete by: As directed    Start light daily activities --- self-care, walking, climbing stairs- beginning the day after surgery.  Gradually increase activities as tolerated.  Control your pain to be active.  Stop when you are tired.  Ideally, walk several times a day, eventually an hour a day.   Most people are back to most day-to-day activities in a few weeks.  It takes 4-6 weeks to get back to unrestricted, intense activity. If you can walk 30 minutes without difficulty, it  is safe to try more intense activity such as jogging, treadmill, bicycling, low-impact aerobics, swimming, etc. Save the most intensive and strenuous activity for last (Usually 4-8 weeks after surgery) such as sit-ups, heavy lifting, contact sports, etc.  Refrain from any intense heavy lifting or straining until you are off narcotics for pain control.  You will have off days, but things should improve week-by-week. DO NOT PUSH THROUGH PAIN.  Let pain be your guide: If it hurts to do something, don't do it.   Lifting restrictions   Complete by: As directed    You may resume regular (light) daily activities beginning the next day-such as daily self-care, walking, climbing stairs-gradually increasing activities as tolerated.   If you can walk 30 minutes without difficulty, it is safe to try more intense activity such as jogging, treadmill, bicycling, low-impact aerobics, swimming, etc. Save the most intensive and strenuous activity for last such as sit-ups, heavy lifting, contact sports, etc   Refrain from any heavy lifting or straining until you are off narcotics for pain control.   DO NOT PUSH THROUGH PAIN.   Let pain be your guide: If it hurts to do something, don't do it.   Pain is your body warning you to avoid that activity for another week until the pain goes down.   Lifting restrictions   Complete by: As directed    If you can walk 30 minutes without difficulty, it is safe to try more intense activity such as jogging, treadmill, bicycling, low-impact aerobics, swimming, etc. Save the most intensive and strenuous activity for last (Usually 4-8 weeks after surgery) such as sit-ups, heavy lifting, contact sports, etc.   Refrain from any intense heavy lifting or straining until you are off narcotics for pain control.  You will have off days, but things should improve week-by-week. DO NOT PUSH THROUGH PAIN.  Let pain be your guide: If it hurts to do something, don't do it.  Pain is your body warning  you to avoid that activity for another week until the pain goes down.   May shower / Bathe   Complete by: As directed    Wash / shower every day.  You may shower over the dressings as they are waterproof.  Continue to shower over incision(s) after the dressing is off.   May shower / Bathe   Complete by: As directed    May walk up steps   Complete by: As directed    May walk up steps   Complete by: As directed    Remove dressing in 72 hours   Complete by: As directed    Remove your waterproof dressings, skin tapes, and other bandages 5 days after surgery.    You may leave the incision(s) open to air.   You may replace a dressing/Band-Aid to cover the incision for comfort if you wish.   Remove dressing in 72 hours   Complete by: As directed    Make sure all dressings are removed by the third day after surgery.  Leave incisions open to air.  OK to cover incisions with gauze or bandages as desired   Sexual Activity Restrictions   Complete by: As directed    You may have sexual intercourse when it is comfortable. If it hurts to do something, stop.   Sexual Activity Restrictions   Complete by: As directed    You may have sexual intercourse when it is comfortable. If it hurts to do something, stop.      Allergies as of 05/07/2019   No Known Allergies     Medication List    TAKE these medications   albuterol 108 (90 Base) MCG/ACT inhaler Commonly known as: VENTOLIN HFA Inhale 2 puffs into the lungs every 4 (four) hours as needed for wheezing or shortness of breath (or coughing).   amLODipine 5 MG tablet Commonly known as: NORVASC daily.   aspirin EC 81 MG tablet Take 81 mg by mouth daily.   B-12 1000 MCG Caps Take 1 capsule by mouth daily.   budesonide-formoterol 160-4.5 MCG/ACT inhaler Commonly known as: SYMBICORT Inhale 2 puffs into the lungs 2 (two) times daily.   Flaxseed Oil 1200 MG Caps Take 1 capsule by mouth daily.   Ginkgo Biloba Extr Take 1 tablet by mouth  daily.   HIMALAYAN GOJI PO Take 1 tablet by mouth in the morning and at bedtime.   meloxicam 15 MG tablet Commonly known as: MOBIC Take 15 mg by mouth daily.   metFORMIN 500 MG tablet Commonly known as: GLUCOPHAGE Take 1 tablet by mouth daily. 1/2 tab daily   multivitamin with minerals Tabs tablet Take 1 tablet by mouth daily.   omeprazole 20 MG capsule Commonly known as: PRILOSEC Take 20 mg by mouth 2 (two) times daily.   pravastatin 40 MG tablet Commonly known as: PRAVACHOL Take 40 mg by mouth daily. Takes 1/2 pill daily   primidone 50 MG tablet Commonly known as: MYSOLINE Take 50 mg by mouth 2 (two) times daily.   terazosin 1 MG capsule Commonly known as: HYTRIN Take 1 capsule by mouth 2 (two) times daily.   traMADol 50 MG tablet Commonly known as: ULTRAM Take 1-2 tablets (50-100 mg total) by mouth every 6 (six) hours as needed for moderate pain or severe pain.   VISINE OP Apply 1 drop to eye daily.            Discharge Care Instructions  (From admission, onward)         Start     Ordered   05/07/19 0000  Discharge wound care:    Comments: It is good for closed incisions and even open wounds to be washed every  day.  Shower every day.  Short baths are fine.  Wash the incisions and wounds clean with soap & water.    You may leave closed incisions open to air if it is dry.   You may cover the incision with clean gauze & replace it after your daily shower for comfort.   05/07/19 1140          Significant Diagnostic Studies:  Results for orders placed or performed during the hospital encounter of 05/06/19 (from the past 72 hour(s))  Glucose, capillary     Status: None   Collection Time: 05/07/19 11:50 AM  Result Value Ref Range   Glucose-Capillary 93 70 - 99 mg/dL    Comment: Glucose reference range applies only to samples taken after fasting for at least 8 hours.    No results found.  Past Medical History:  Diagnosis Date  . COPD (chronic  obstructive pulmonary disease) ([place])   . Diverticulosis   . DM type 2 (diabetes mellitus, type 2) ([place])   . GERD (gastroesophageal reflux disease)    uses prilosec   . Hypertension   . Pneumonia 2018  . Raynaud's disease   . Shortness of breath dyspnea    WITH EXERTION  . Sinusitis   . Tremors of nervous system     Past Surgical History:  Procedure Laterality Date  . COLONOSCOPY  03/27/2005  . HERNIA REPAIR    . INGUINAL HERNIA REPAIR N/A 05/06/2019   Procedure: LAPAROSCOPIC RIGHT AND LEFT INGUINAL HERNIA(S) REPAIR;  Surgeon: Michael Boston, MD;  Location: [facility];  Service: General;  Laterality: N/A;    Social History   Socioeconomic History  . Marital status: Single    Spouse name: Not on file  . Number of children: Not on file  . Years of education: Not on file  . Highest education level: Not on file  Occupational History  . Not on file  Tobacco Use  . Smoking status: Former Smoker    Packs/day: 1.00    Years: 45.00    Pack years: 45.00    Types: Cigarettes    Quit date: 01/21/1994    Years since quitting: 25.3  . Smokeless tobacco: Never Used  Substance and Sexual Activity  . Alcohol use: Yes    Comment: a drink daily with dinner  . Drug use: No  . Sexual activity: Not on file  Other Topics Concern  . Not on file  Social History Narrative  . Not on file   Social Determinants of Health   Financial Resource Strain:   . Difficulty of Paying Living Expenses:   Food Insecurity:   . Worried About Charity fundraiser in the Last Year:   . Arboriculturist in the Last Year:   Transportation Needs:   . Film/video editor (Medical):   Marland Kitchen Lack of Transportation (Non-Medical):   Physical Activity:   . Days of Exercise per Week:   . Minutes of Exercise per Session:   Stress:   . Feeling of Stress :   Social Connections:   . Frequency of Communication with Friends and Family:   . Frequency of Social Gatherings with Friends and Family:   .  Attends Religious Services:   . Active Member of Clubs or Organizations:   . Attends Archivist Meetings:   Marland Kitchen Marital Status:   Intimate Partner Violence:   . Fear of Current or Ex-Partner:   . Emotionally Abused:   Marland Kitchen Physically  Abused:   . Sexually Abused:     Family History  Problem Relation Age of Onset  . Dementia Brother   . Diabetes Brother   . Heart attack Father   . Dementia Father     No current facility-administered medications for this encounter.   Current Outpatient Medications  Medication Sig Dispense Refill  . albuterol (PROVENTIL HFA;VENTOLIN HFA) 108 (90 Base) MCG/ACT inhaler Inhale 2 puffs into the lungs every 4 (four) hours as needed for wheezing or shortness of breath (or coughing). 1 Inhaler 0  . amLODipine (NORVASC) 5 MG tablet daily.    Marland Kitchen aspirin EC 81 MG tablet Take 81 mg by mouth daily.    . budesonide-formoterol (SYMBICORT) 160-4.5 MCG/ACT inhaler Inhale 2 puffs into the lungs 2 (two) times daily. 1 Inhaler 12  . Cyanocobalamin (B-12) 1000 MCG CAPS Take 1 capsule by mouth daily.    . Flaxseed, Linseed, (FLAXSEED OIL) 1200 MG CAPS Take 1 capsule by mouth daily.    . Ginkgo Biloba EXTR Take 1 tablet by mouth daily.    . meloxicam (MOBIC) 15 MG tablet Take 15 mg by mouth daily.    . metFORMIN (GLUCOPHAGE) 500 MG tablet Take 1 tablet by mouth daily. 1/2 tab daily    . Misc Natural Products (HIMALAYAN GOJI PO) Take 1 tablet by mouth in the morning and at bedtime.     . Multiple Vitamin (MULTIVITAMIN WITH MINERALS) TABS tablet Take 1 tablet by mouth daily.    Marland Kitchen omeprazole (PRILOSEC) 20 MG capsule Take 20 mg by mouth 2 (two) times daily.    . pravastatin (PRAVACHOL) 40 MG tablet Take 40 mg by mouth daily. Takes 1/2 pill daily    . primidone (MYSOLINE) 50 MG tablet Take 50 mg by mouth 2 (two) times daily.    Marland Kitchen terazosin (HYTRIN) 1 MG capsule Take 1 capsule by mouth 2 (two) times daily.    . Tetrahydrozoline HCl (VISINE OP) Apply 1 drop to eye daily.      . traMADol (ULTRAM) 50 MG tablet Take 1-2 tablets (50-100 mg total) by mouth every 6 (six) hours as needed for moderate pain or severe pain. 20 tablet 0     No Known Allergies  Signed: Morton Peters, MD, FACS, MASCRS Gastrointestinal and Minimally Invasive Surgery  [facility] [address] [place], [place] [address] [phone] Main / Paging [phone] Fax     05/10/2019, 9:03 AM

## 2019-08-06 LAB — CBC AND DIFFERENTIAL
HCT: 40 — AB (ref 41–53)
Hemoglobin: 13.5 (ref 13.5–17.5)
Platelets: 161 (ref 150–399)
WBC: 4.9

## 2019-08-06 LAB — HEMOGLOBIN A1C: Hemoglobin A1C: 6.2

## 2019-08-09 LAB — LIPID PANEL
Cholesterol: 145 (ref 0–200)
HDL: 45 (ref 35–70)
LDL Cholesterol: 88
LDl/HDL Ratio: 2
Triglycerides: 60 (ref 40–160)

## 2019-10-01 ENCOUNTER — Telehealth: Payer: Self-pay | Admitting: General Practice

## 2019-10-01 NOTE — Telephone Encounter (Signed)
Caller [facility]  Call Back # [phone]  Patient states he spoke Dr Lorelei Pont sometime ago, per patient Dr Lorelei Pont agreed to take patient on.   Please Advise

## 2019-10-04 NOTE — Telephone Encounter (Signed)
Ok to schedule pt?

## 2019-10-04 NOTE — Telephone Encounter (Signed)
Patient called back again to be scheduled. Very insistent. Wants to know today when we can schedule him for.

## 2019-10-04 NOTE — Telephone Encounter (Signed)
Sure- Olanrewaju is the caretaker of another pt who we see

## 2019-10-16 NOTE — Progress Notes (Addendum)
[facility] [place], [place], [place] [address] [phone]  Date:  10/18/2019   Name:  Johnathan [name]   DOB:  [birth year]   MRN:  [id number]  PCP:  Darreld Mclean, MD    Chief Complaint: New Patient (Initial Visit) (lab work, med check) and Back Pain   History of Present Illness:  JAIDEEP POLLACK is a 86 y.o. very pleasant male patient who presents with the following:  Gentleman here today as a new patient to establish care-  His PCP has been away with his own illness for some time so he has come to see me - [facility] is his former primary care office, will request records  History of COPD, diabetes, hypertension, GERD- per pt he has PRE-diabetes only? Also history of diverticulitis, hernia repair with mesh earlier this year  Eye exam COVID-19 series is UTD Pneumonia series- we are not sure but he thinks he is UTD Flu vaccine- give today Routine labs  He has noted some lower back pain recently- NKI- and applied voltaren which resolved his pain He last had what sounds like sciatica in the 1960s.  - he has not been bothered by this pain in 50 years!  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 Left inguinal hernia s/p lap repair w mesh 05/06/2019 05/06/2019  .  Recurrent right inguinal hernia s/p lap repair w mesh 05/06/2019 03/01/2019  . Diabetes mellitus ([place]) 03/01/2019  . Pneumonia 04/21/2014  . COPD (chronic obstructive pulmonary disease) ([place]) 04/21/2014    Past Medical History:  Diagnosis Date  . COPD (chronic obstructive pulmonary disease) ([place])   . Diverticulosis   . DM type 2 (diabetes mellitus, type 2) ([place])   . GERD (gastroesophageal reflux disease)    uses prilosec   . Hypertension   . Pneumonia 2018  . Raynaud's disease   . Shortness of breath dyspnea    WITH EXERTION  . Sinusitis   . Tremors of nervous system     Past Surgical History:  Procedure Laterality Date  . COLONOSCOPY  03/27/2005  . HERNIA REPAIR    . INGUINAL HERNIA REPAIR N/A 05/06/2019   Procedure: LAPAROSCOPIC RIGHT AND LEFT INGUINAL HERNIA(S) REPAIR;  Surgeon: Michael Boston, MD;  Location: [facility];  Service: General;  Laterality: N/A;    Social History   Tobacco Use  . Smoking status: Former Smoker    Packs/day: 1.00    Years: 45.00    Pack years: 45.00    Types: Cigarettes    Quit date: 01/21/1994    Years since quitting: 25.7  . Smokeless tobacco:  Never Used  Substance Use Topics  . Alcohol use: Yes    Comment: a drink daily with dinner  . Drug use: No    Family History  Problem Relation Age of Onset  . Dementia Brother   . Diabetes Brother   . Heart attack Father   . Dementia Father     No Known Allergies  Medication list has been reviewed and updated.  Current Outpatient Medications on File Prior to Visit  Medication Sig Dispense Refill  . albuterol (PROVENTIL HFA;VENTOLIN HFA) 108 (90 Base) MCG/ACT inhaler Inhale 2 puffs into the lungs every 4 (four) hours as needed for wheezing or shortness of breath (or coughing). 1 Inhaler 0  . amLODipine (NORVASC) 5 MG tablet daily.    Marland Kitchen aspirin EC 81 MG tablet Take 81 mg by mouth daily.    . budesonide-formoterol (SYMBICORT) 160-4.5 MCG/ACT inhaler Inhale 2 puffs into the  lungs 2 (two) times daily. 1 Inhaler 12  . Cyanocobalamin (B-12) 1000 MCG CAPS Take 1 capsule by mouth daily.    . Flaxseed, Linseed, (FLAXSEED OIL) 1200 MG CAPS Take 1 capsule by mouth daily.    . Ginkgo Biloba EXTR Take 1 tablet by mouth daily.    Marland Kitchen loratadine (CLARITIN) 10 MG tablet Take 10 mg by mouth daily.    . meloxicam (MOBIC) 15 MG tablet Take 15 mg by mouth daily.    . metFORMIN (GLUCOPHAGE) 500 MG tablet Take 1 tablet by mouth daily. 1/2 tab daily    . Misc Natural Products (HIMALAYAN GOJI PO) Take 1 tablet by mouth in the morning and at bedtime.     . Multiple Vitamin (MULTIVITAMIN WITH MINERALS) TABS tablet Take 1 tablet by mouth daily.    Marland Kitchen omeprazole (PRILOSEC) 20 MG capsule Take 20 mg by mouth 2 (two) times daily.    . pravastatin (PRAVACHOL) 40 MG tablet Take 40 mg by mouth daily. Takes 1/2 pill daily    . primidone (MYSOLINE) 50 MG tablet Take 50 mg by mouth 2 (two) times daily.    Marland Kitchen terazosin (HYTRIN) 1 MG capsule Take 1 capsule by mouth 2 (two) times daily.    . Tetrahydrozoline HCl (VISINE OP) Apply 1 drop to eye daily.    . traMADol (ULTRAM) 50 MG tablet Take 1-2 tablets (50-100 mg total) by mouth every 6 (six) hours as needed for moderate pain or severe pain. (Patient not taking: Reported on 10/18/2019) 20 tablet 0   No current facility-administered medications on file prior to visit.    Review of Systems:  As per HPI- otherwise negative.   Physical Examination: Vitals:   10/18/19 1332  BP: (!) 144/70  Pulse: 61  Resp: 15  SpO2: 97%   Vitals:   10/18/19 1332  Weight: 157 lb (71.2 kg)  Height: 5\' 10"  (1.778 m)   Body mass index is 22.53 kg/m. Ideal Body Weight: Weight in (lb) to have BMI = 25: 173.9  GEN: no acute distress.  Slim build, looks well  HEENT: Atraumatic, Normocephalic.  Ears and Nose: No external deformity. CV: RRR, No M/G/R. No JVD. No thrill. No extra heart sounds. PULM: CTA B, no wheezes, crackles, rhonchi. No retractions. No resp.  distress. No accessory muscle use. ABD: S, NT, ND, +BS. No rebound. No HSM. EXTR: No c/c/e PSYCH: Normally interactive. Conversant.    Assessment and Plan: Elevated glucose - Plan: Comprehensive metabolic panel, Hemoglobin A1c  Screening, deficiency anemia, iron - Plan: CBC  Screening for hyperlipidemia - Plan: Lipid  panel  Macrocytosis - Plan: CBC  B12 deficiency - Plan: B12  Generally healthy for age, here today to establish care Request outside med records Will plan further follow- up pending labs. Flu vaccine today Discussed and recommended covid booster   Pt would like to see if any of his medications can be removed form his list pending his lab results  This visit occurred during the SARS-CoV-2 public health emergency.  Safety protocols were in place, including screening questions prior to the visit, additional usage of staff PPE, and extensive cleaning of exam room while observing appropriate contact time as indicated for disinfecting solutions.    Signed Lamar Blinks, MD   Received labs as below 9/28- message to pt   Results for orders placed or performed in visit on 10/18/19  B12  Result Value Ref Range   Vitamin B-12 >2,000 (H) 200 - 1,100 pg/mL  CBC  Result Value Ref Range   WBC 4.8 3.8 - 10.8 Thousand/uL   RBC 3.97 (L) 4.20 - 5.80 Million/uL   Hemoglobin 12.8 (L) 13.2 - 17.1 g/dL   HCT 38.2 (L) 38 - 50 %   MCV 96.2 80.0 - 100.0 fL   MCH 32.2 27.0 - 33.0 pg   MCHC 33.5 32.0 - 36.0 g/dL   RDW 12.7 11.0 - 15.0 %   Platelets 158 140 - 400 Thousand/uL   MPV 9.2 7.5 - 12.5 fL  Comprehensive metabolic panel  Result Value Ref Range   Glucose, Bld 131 (H) 65 - 99 mg/dL   BUN 19 7 - 25 mg/dL   Creat 1.06 0.70 - 1.11 mg/dL   BUN/Creatinine Ratio NOT APPLICABLE 6 - 22 (calc)   Sodium 139 135 - 146 mmol/L   Potassium 4.6 3.5 - 5.3 mmol/L   Chloride 102 98 - 110 mmol/L   CO2 29 20 - 32 mmol/L   Calcium 9.3 8.6 - 10.3 mg/dL   Total Protein 6.9 6.1 - 8.1  g/dL   Albumin 4.1 3.6 - 5.1 g/dL   Globulin 2.8 1.9 - 3.7 g/dL (calc)   AG Ratio 1.5 1.0 - 2.5 (calc)   Total Bilirubin 0.4 0.2 - 1.2 mg/dL   Alkaline phosphatase (APISO) 64 35 - 144 U/L   AST 18 10 - 35 U/L   ALT 14 9 - 46 U/L  Hemoglobin A1c  Result Value Ref Range   Hgb A1c MFr Bld 6.3 (H) <5.7 % of total Hgb   Mean Plasma Glucose 134 (calc)   eAG (mmol/L) 7.4 (calc)  Lipid panel  Result Value Ref Range   Cholesterol 125 <200 mg/dL   HDL 44 > OR = 40 mg/dL   Triglycerides 68 <150 mg/dL   LDL Cholesterol (Calc) 67 mg/dL (calc)   Total CHOL/HDL Ratio 2.8 <5.0 (calc)   Non-HDL Cholesterol (Calc) 81 <130 mg/dL (calc)

## 2019-10-18 ENCOUNTER — Other Ambulatory Visit: Payer: Self-pay

## 2019-10-18 ENCOUNTER — Encounter: Payer: Self-pay | Admitting: Family Medicine

## 2019-10-18 ENCOUNTER — Ambulatory Visit (INDEPENDENT_AMBULATORY_CARE_PROVIDER_SITE_OTHER): Payer: Medicare HMO | Admitting: Family Medicine

## 2019-10-18 VITALS — BP 144/70 | HR 61 | Resp 15 | Ht 70.0 in | Wt 157.0 lb

## 2019-10-18 DIAGNOSIS — Z13220 Encounter for screening for lipoid disorders: Secondary | ICD-10-CM

## 2019-10-18 DIAGNOSIS — D7589 Other specified diseases of blood and blood-forming organs: Secondary | ICD-10-CM

## 2019-10-18 DIAGNOSIS — Z130 Encounter for screening for diseases of the blood and blood-forming organs and certain disorders involving the immune mechanism: Secondary | ICD-10-CM

## 2019-10-18 DIAGNOSIS — R7309 Other abnormal glucose: Secondary | ICD-10-CM

## 2019-10-18 DIAGNOSIS — E538 Deficiency of other specified B group vitamins: Secondary | ICD-10-CM | POA: Diagnosis not present

## 2019-10-18 DIAGNOSIS — Z23 Encounter for immunization: Secondary | ICD-10-CM | POA: Diagnosis not present

## 2019-10-18 NOTE — Patient Instructions (Addendum)
It was good to see you again today!  Give our best to [facility] I will be in touch with your labs and we will request records from [facility]  Flu shot given today I would recommend that you get a covid 19 booster about 6 months after your 2nd shot was given

## 2019-10-19 ENCOUNTER — Encounter: Payer: Self-pay | Admitting: Family Medicine

## 2019-10-19 DIAGNOSIS — D649 Anemia, unspecified: Secondary | ICD-10-CM

## 2019-10-19 LAB — LIPID PANEL
Cholesterol: 125 mg/dL (ref <200)
HDL: 44 mg/dL (ref >=40)
LDL Cholesterol (Calc): 67 mg/dL (calc)
Non-HDL Cholesterol (Calc): 81 mg/dL (calc) (ref <130)
Total CHOL/HDL Ratio: 2.8 (calc) (ref <5.0)
Triglycerides: 68 mg/dL (ref <150)

## 2019-10-19 LAB — COMPREHENSIVE METABOLIC PANEL
AG Ratio: 1.5 (calc) (ref 1.0–2.5)
ALT: 14 U/L (ref 9–46)
AST: 18 U/L (ref 10–35)
Albumin: 4.1 g/dL (ref 3.6–5.1)
Alkaline phosphatase (APISO): 64 U/L (ref 35–144)
BUN: 19 mg/dL (ref 7–25)
CO2: 29 mmol/L (ref 20–32)
Calcium: 9.3 mg/dL (ref 8.6–10.3)
Chloride: 102 mmol/L (ref 98–110)
Creat: 1.06 mg/dL (ref 0.70–1.11)
Globulin: 2.8 g/dL (calc) (ref 1.9–3.7)
Glucose, Bld: 131 mg/dL — ABNORMAL HIGH (ref 65–99)
Potassium: 4.6 mmol/L (ref 3.5–5.3)
Sodium: 139 mmol/L (ref 135–146)
Total Bilirubin: 0.4 mg/dL (ref 0.2–1.2)
Total Protein: 6.9 g/dL (ref 6.1–8.1)

## 2019-10-19 LAB — CBC
HCT: 38.2 % — ABNORMAL LOW (ref 38.5–50.0)
Hemoglobin: 12.8 g/dL — ABNORMAL LOW (ref 13.2–17.1)
MCH: 32.2 pg (ref 27.0–33.0)
MCHC: 33.5 g/dL (ref 32.0–36.0)
MCV: 96.2 fL (ref 80.0–100.0)
MPV: 9.2 fL (ref 7.5–12.5)
Platelets: 158 10*3/uL (ref 140–400)
RBC: 3.97 10*6/uL — ABNORMAL LOW (ref 4.20–5.80)
RDW: 12.7 % (ref 11.0–15.0)
WBC: 4.8 10*3/uL (ref 3.8–10.8)

## 2019-10-19 LAB — HEMOGLOBIN A1C
Hgb A1c MFr Bld: 6.3 % of total Hgb — ABNORMAL HIGH (ref <5.7)
Mean Plasma Glucose: 134 (calc)
eAG (mmol/L): 7.4 (calc)

## 2019-10-19 LAB — VITAMIN B12: Vitamin B-12: >2000 pg/mL — ABNORMAL HIGH (ref 200–1100)

## 2019-10-22 ENCOUNTER — Telehealth: Payer: Self-pay | Admitting: Family Medicine

## 2019-10-22 NOTE — Telephone Encounter (Signed)
Phone number: [phone] Patient would like to know his lab results.

## 2019-10-25 ENCOUNTER — Other Ambulatory Visit: Payer: Self-pay

## 2019-10-25 MED ORDER — OMEPRAZOLE 20 MG PO CPDR: 20.0000 mg | DELAYED_RELEASE_CAPSULE | Freq: Two times a day (BID) | ORAL | 5 refills | Status: DC

## 2019-10-25 NOTE — Telephone Encounter (Signed)
Spoke with patient regarding lab results. He states he does not use mychart. H estates he would rather check his iron level again prior to sending in stool sample. He expressed his last colonoscopy was approximately 5 years ago. Please advise when patient should come for labs if appropriate.

## 2019-10-25 NOTE — Telephone Encounter (Signed)
Called patient, spoke with him regarding his results.

## 2019-10-26 ENCOUNTER — Other Ambulatory Visit: Payer: Self-pay | Admitting: Family Medicine

## 2019-10-29 NOTE — Telephone Encounter (Signed)
Patient scheduled for labs on 12/23/2019

## 2019-10-29 NOTE — Telephone Encounter (Signed)
Left message with patient to call back to schedule lab appointment in 2 months.

## 2019-11-14 ENCOUNTER — Telehealth: Payer: Self-pay | Admitting: Family Medicine

## 2019-11-14 DIAGNOSIS — B029 Zoster without complications: Secondary | ICD-10-CM

## 2019-11-14 DIAGNOSIS — I7300 Raynaud's syndrome without gangrene: Secondary | ICD-10-CM

## 2019-11-14 DIAGNOSIS — C4490 Unspecified malignant neoplasm of skin, unspecified: Secondary | ICD-10-CM

## 2019-11-14 NOTE — Telephone Encounter (Signed)
Received a very large packet of records from [facility].  Will abstract and scan as needed

## 2019-11-15 ENCOUNTER — Telehealth: Payer: Self-pay | Admitting: Family Medicine

## 2019-11-15 NOTE — Telephone Encounter (Signed)
Pt came in office stating is needing to speak with provider or [facility] about a concern on raynaud syndrome. Ok to call pt at [phone]. Please advise.

## 2019-11-16 NOTE — Telephone Encounter (Signed)
This is not a new issue however it is worsening. No pain at all. I have scheduled patient to see Dr. Lorelei Pont next Thursday. This was his first availability due to work.

## 2019-11-16 NOTE — Telephone Encounter (Signed)
Ok- please give him a call back.  Is this a new issue?  Is he having any persistent pain?  Assuming he is not having significant pain please reassure I don't think this is a dangerous situation, but I am glad to see him and follow-up at his convenience

## 2019-11-16 NOTE — Telephone Encounter (Signed)
Called patient, spoke with him. He states his first two fingers on right hand will turn blue, lose circulation. However recently the middle finger goes numb and turns white to palm of hand. He is concerned he might be at risk for losing his hands. He isn't sure if this ad something to do with him taking Viagra in the past or currently sildenafil. Please advise on further instructions.

## 2019-11-17 ENCOUNTER — Encounter: Payer: Self-pay | Admitting: *Deleted

## 2019-11-24 NOTE — Progress Notes (Signed)
[facility] [address], [place], [place] [address] [phone]  Date:  11/25/2019   Name:  Johnathan [name]   DOB:  08/27/1932   MRN:  [id number]  PCP:  Darreld Mclean, MD    Chief Complaint: Reynauds (right middle finger loses circulation, whiteness)   History of Present Illness:  Johnathan [name] is a 86 y.o. very pleasant male patient who presents with the following:  Patient today with concern of possible Raynaud's syndrome in his hands Last seen by myself in late September to establish care History of COPD, diabetes, hypertension, GERD- per pt he has PRE-diabetes only? Also history of diverticulitis, hernia repair with mesh earlier this year  He called in recently with concern of Raynaud's syndrome symptoms Last month his entire right long finger "went dead"- turned white and concerned him.  This lasted about 5 minutes or so He has had Raynaud's in his hands before but generally just the distal fingers are involved-this was the first time the white color spread to the entire finger No pain No sx in his toes His lips, ears, nose have turned while/blue in the past as well These sx first appeared 5- 10 years ago  He has been taking sildenafil 10 mg daily for many years in an attempt to treat his Raynaud's He does not note any adverse side effects from this dose of sildenafil  Possible secondary causes of Raynaud's Complete blood count and differential ?Complete metabolic panel ?Urinalysis with urine sediment  ?ANA (ideally by indirect immunofluorescence testing) ?Thyroid-stimulating hormone (TSH) ?ESR and/or C-reactive protein (CRP) levels  Foot exam due- will update today  Eye exam- reminded  Can offer prevnar 13- will give today     Patient Active Problem List   Diagnosis Date Noted  . Shingles 11/14/2019  . Raynaud's syndrome 11/14/2019  . Skin cancer 11/14/2019  . Left inguinal hernia s/p lap  repair w mesh 05/06/2019 05/06/2019  . Recurrent right inguinal hernia s/p lap repair w mesh 05/06/2019 03/01/2019  . Diabetes mellitus ([place]) 03/01/2019  . Pneumonia 04/21/2014  . COPD (chronic obstructive pulmonary disease) ([place]) 04/21/2014    Past Medical History:  Diagnosis Date  . COPD (chronic obstructive pulmonary disease) ([place])   . Diverticulosis   . DM type 2 (diabetes mellitus, type 2) ([place])   . GERD (gastroesophageal reflux disease)    uses prilosec   . Hypertension   . Pneumonia 2018  . Raynaud's disease   . Shortness of breath dyspnea    WITH EXERTION  . Sinusitis   . Tremors of nervous system     Past Surgical History:  Procedure Laterality Date  . COLONOSCOPY  03/27/2005  . HERNIA REPAIR    . INGUINAL HERNIA REPAIR N/A 05/06/2019   Procedure: LAPAROSCOPIC RIGHT AND LEFT INGUINAL HERNIA(S) REPAIR;  Surgeon: Michael Boston, MD;  Location: [facility];  Service: General;  Laterality: N/A;    Social History   Tobacco Use  . Smoking status: Former Smoker    Packs/day: 1.00    Years: 45.00    Pack years: 45.00    Types: Cigarettes    Quit date: 01/21/1994    Years since quitting: 25.8  . Smokeless tobacco: Never Used  Substance Use Topics  . Alcohol use: Yes    Comment: a drink daily with dinner  . Drug use: No    Family History  Problem Relation Age of Onset  .  Dementia Brother   . Diabetes Brother   . Heart attack Father   . Dementia Father     No Known Allergies  Medication list has been reviewed and updated.  Current Outpatient Medications on File Prior to Visit  Medication Sig Dispense Refill  . albuterol (PROVENTIL HFA;VENTOLIN HFA) 108 (90 Base) MCG/ACT inhaler Inhale 2 puffs into the lungs every 4 (four) hours as needed for wheezing or shortness of breath (or coughing). 1 Inhaler 0  . amLODipine (NORVASC) 5 MG tablet daily.    Marland Kitchen aspirin EC 81 MG tablet Take 81 mg by mouth daily.    . budesonide-formoterol (SYMBICORT) 160-4.5  MCG/ACT inhaler Inhale 2 puffs into the lungs 2 (two) times daily. 1 Inhaler 12  . Cyanocobalamin (B-12) 1000 MCG CAPS Take 1 capsule by mouth daily.    . Flaxseed, Linseed, (FLAXSEED OIL) 1200 MG CAPS Take 1 capsule by mouth daily.    . Ginkgo Biloba EXTR Take 1 tablet by mouth daily.    Marland Kitchen loratadine (CLARITIN) 10 MG tablet Take 10 mg by mouth daily.    . meloxicam (MOBIC) 15 MG tablet Take 15 mg by mouth daily.    . metFORMIN (GLUCOPHAGE) 500 MG tablet Take 1 tablet by mouth daily. 1/2 tab daily    . Misc Natural Products (HIMALAYAN GOJI PO) Take 1 tablet by mouth in the morning and at bedtime.     . Multiple Vitamin (MULTIVITAMIN WITH MINERALS) TABS tablet Take 1 tablet by mouth daily.    Marland Kitchen omeprazole (PRILOSEC) 20 MG capsule TAKE 1 CAPSULE TWICE DAILY. 30 capsule 5  . pravastatin (PRAVACHOL) 40 MG tablet Take 40 mg by mouth daily. Takes 1/2 pill daily    . primidone (MYSOLINE) 50 MG tablet Take 50 mg by mouth 2 (two) times daily.    Marland Kitchen terazosin (HYTRIN) 1 MG capsule Take 1 capsule by mouth 2 (two) times daily.    . Tetrahydrozoline HCl (VISINE OP) Apply 1 drop to eye daily.     No current facility-administered medications on file prior to visit.    Review of Systems:  As per HPI- otherwise negative.   Physical Examination: Vitals:   11/25/19 1334  BP: 140/82  Pulse: 77  Resp: 15  SpO2: 93%   Vitals:   11/25/19 1334  Weight: 157 lb (71.2 kg)  Height: 5' 10"  (1.778 m)   Body mass index is 22.53 kg/m. Ideal Body Weight: Weight in (lb) to have BMI = 25: 173.9  GEN: no acute distress. Normal weight, looks well  HEENT: Atraumatic, Normocephalic.  Ears and Nose: No external deformity. CV: RRR, No M/G/R. No JVD. No thrill. No extra heart sounds. PULM: CTA B, no wheezes, crackles, rhonchi. No retractions. No resp. distress. No accessory muscle use. EXTR: No c/c/e PSYCH: Normally interactive. Conversant.  Foot exam- normal pulses and sensation Time the patient's hands  display normal circulation, normal cap refill and strong radial pulses.  No discoloration  Assessment and Plan: Raynaud's disease without gangrene - Plan: sildenafil (REVATIO) 20 MG tablet  Immunization due - Plan: Pneumococcal conjugate vaccine 13-valent IM  Update pneumonia vaccine Patient is here today to discuss Raynaud's phenomenon.  He has had this issue for quite some time, became concerned as the symptoms seem to have changed.  We discussed doing some further testing for secondary causes. At this time Johnathan [name] does not wish to do any further evaluation, he feels comfortable with observation.  He will let me know if things become worse or  he is otherwise concerned- I refilled sildenafil, can continue to take 10 mg daily This visit occurred during the SARS-CoV-2 public health emergency.  Safety protocols were in place, including screening questions prior to the visit, additional usage of staff PPE, and extensive cleaning of exam room while observing appropriate contact time as indicated for disinfecting solutions.   Signed Lamar Blinks, MD

## 2019-11-24 NOTE — Patient Instructions (Addendum)
Good to see you again! I agree that you have Raynaud's syndrome of your hands- let me know if this starts to get worse or changes, otherwise I think ok to manage as you are currently  Ok to continue sildenafil 10 mg daily to help prevent symptoms You got your pneumonia booster today

## 2019-11-25 ENCOUNTER — Ambulatory Visit (INDEPENDENT_AMBULATORY_CARE_PROVIDER_SITE_OTHER): Payer: Medicare HMO | Admitting: Family Medicine

## 2019-11-25 ENCOUNTER — Encounter: Payer: Self-pay | Admitting: Family Medicine

## 2019-11-25 ENCOUNTER — Other Ambulatory Visit: Payer: Self-pay

## 2019-11-25 VITALS — BP 140/82 | HR 77 | Resp 15 | Ht 70.0 in | Wt 157.0 lb

## 2019-11-25 DIAGNOSIS — I7300 Raynaud's syndrome without gangrene: Secondary | ICD-10-CM | POA: Diagnosis not present

## 2019-11-25 DIAGNOSIS — Z23 Encounter for immunization: Secondary | ICD-10-CM | POA: Diagnosis not present

## 2019-11-25 MED ORDER — SILDENAFIL CITRATE 20 MG PO TABS: 10.0000 mg | ORAL_TABLET | Freq: Every day | ORAL | 3 refills | Status: DC

## 2019-11-30 ENCOUNTER — Telehealth: Payer: Self-pay | Admitting: Family Medicine

## 2019-11-30 NOTE — Telephone Encounter (Signed)
PA approved for omeprazole 30 tablets every 15 days. Effective 09/22/19 to 01/21/20.

## 2019-11-30 NOTE — Telephone Encounter (Signed)
Attempted PA for both prescriptions.   Key: [id number] -Omeprazole [id number]  Waiting on determination.

## 2019-11-30 NOTE — Telephone Encounter (Signed)
Patient states [employer] need a prior authorization on  sildenafil (REVATIO) 20 MG tablet [[id number]]   omeprazole (PRILOSEC) 20 MG capsule [[id number]]   Call them at [phone]

## 2019-12-01 NOTE — Telephone Encounter (Signed)
Received denial on patient's sildenafil. I have attempted appeal and faxed to [employer]. Patient is on medication due to circulatory issues due to diagnosis of Raynaud's Syndrome.   Waiting on appeal determination.

## 2019-12-03 NOTE — Telephone Encounter (Signed)
Received approval regarding patients sildenafil after filling out appeal. This medication has been authorized from 09/22/2019-01/21/2020.

## 2019-12-07 ENCOUNTER — Telehealth: Payer: Self-pay | Admitting: Family Medicine

## 2019-12-07 ENCOUNTER — Other Ambulatory Visit: Payer: Self-pay

## 2019-12-07 DIAGNOSIS — I7300 Raynaud's syndrome without gangrene: Secondary | ICD-10-CM

## 2019-12-07 MED ORDER — SILDENAFIL CITRATE 20 MG PO TABS: 10.0000 mg | ORAL_TABLET | Freq: Every day | ORAL | 3 refills | Status: DC

## 2019-12-07 NOTE — Telephone Encounter (Signed)
[facility]oke with [facility] apparently they never received script for sildenafil. I advised her both rx required PA and were approved. I have resent his medications to mail order pharmacy as well as faxed approval letters for medication. Representative states she will mark as expedited.

## 2019-12-07 NOTE — Telephone Encounter (Signed)
Left message to let patient know update.

## 2019-12-07 NOTE — Telephone Encounter (Signed)
Patient states he would like a call back from [facility] of Dr Lorelei Pont

## 2019-12-07 NOTE — Telephone Encounter (Signed)
Spoke with patient. Advised him on update. He thanked me. States he will call back if he has any issues.

## 2019-12-07 NOTE — Telephone Encounter (Signed)
Spoke with patient he is questioning coverage regarding his omeprazole and sildenafil. I advised him I had to do a prior auth for both medications which got approved. I advised him I will call [employer] to see what the issue is and will call him back.

## 2019-12-13 ENCOUNTER — Telehealth: Payer: Self-pay | Admitting: Family Medicine

## 2019-12-13 MED ORDER — PRIMIDONE 50 MG PO TABS: 50.0000 mg | ORAL_TABLET | Freq: Two times a day (BID) | ORAL | 1 refills | Status: DC

## 2019-12-13 NOTE — Telephone Encounter (Signed)
  Patient is requesting a 90day supply    Medication: primidone (MYSOLINE) 50 MG tablet [[id number]]   Has the patient contacted their pharmacy? No. (If no, request that the patient contact the pharmacy for the refill.) (If yes, when and what did the pharmacy advise?)  Preferred Pharmacy (with phone number or street name):  [facility] [place] [facility] [place] [facility] Phone:  [phone]  Fax:  [phone]       Agent: Please be advised that RX refills may take up to 3 business days. We ask that you follow-up with your pharmacy.

## 2019-12-13 NOTE — Telephone Encounter (Signed)
Sent refill to pharmacy. 

## 2019-12-23 ENCOUNTER — Other Ambulatory Visit (INDEPENDENT_AMBULATORY_CARE_PROVIDER_SITE_OTHER): Payer: Medicare HMO

## 2019-12-23 ENCOUNTER — Other Ambulatory Visit: Payer: Self-pay

## 2019-12-23 DIAGNOSIS — D649 Anemia, unspecified: Secondary | ICD-10-CM

## 2019-12-23 LAB — CBC
HCT: 42.9 % (ref 39.0–52.0)
Hemoglobin: 14.6 g/dL (ref 13.0–17.0)
MCHC: 34.1 g/dL (ref 30.0–36.0)
MCV: 95 fl (ref 78.0–100.0)
Platelets: 187 10*3/uL (ref 150.0–400.0)
RBC: 4.52 Mil/uL (ref 4.22–5.81)
RDW: 13.5 % (ref 11.5–15.5)
WBC: 6.2 10*3/uL (ref 4.0–10.5)

## 2020-01-17 ENCOUNTER — Telehealth: Payer: Self-pay | Admitting: Family Medicine

## 2020-01-17 DIAGNOSIS — I7300 Raynaud's syndrome without gangrene: Secondary | ICD-10-CM

## 2020-01-17 NOTE — Telephone Encounter (Signed)
Patient states he needs a 90 day supply of [facility]   Please Advise   Medication: terazosin (HYTRIN) 1 MG capsule [[id number]]   sildenafil (REVATIO) 20 MG tablet [[id number]]   pravastatin (PRAVACHOL) 40 MG tablet [[id number]  omeprazole (PRILOSEC) 20 MG capsule [[id number]]   loratadine (CLARITIN) 10 MG tablet [[id number]]   Has the patient contacted their pharmacy? No. (If no, request that the patient contact the pharmacy for the refill.) (If yes, when and what did the pharmacy advise?)  Preferred Pharmacy (with phone number or street name): [facility] [place], [place] - [address] [facility]  [address] [place], [place] [address]  Phone:  [phone] Fax:  [phone]   Agent: Please be advised that RX refills may take up to 3 business days. We ask that you follow-up with your pharmacy.

## 2020-01-18 MED ORDER — TERAZOSIN HCL 1 MG PO CAPS
1.0000 mg | ORAL_CAPSULE | Freq: Two times a day (BID) | ORAL | 1 refills | Status: DC
Start: 2020-01-18 — End: 2020-02-01

## 2020-01-18 MED ORDER — PRAVASTATIN SODIUM 40 MG PO TABS
20.0000 mg | ORAL_TABLET | Freq: Every day | ORAL | 1 refills | Status: DC
Start: 2020-01-18 — End: 2020-02-01

## 2020-01-18 MED ORDER — LORATADINE 10 MG PO TABS
10.0000 mg | ORAL_TABLET | Freq: Every day | ORAL | 1 refills | Status: DC
Start: 2020-01-18 — End: 2020-02-01

## 2020-01-18 MED ORDER — OMEPRAZOLE 20 MG PO CPDR
20.0000 mg | DELAYED_RELEASE_CAPSULE | Freq: Two times a day (BID) | ORAL | 1 refills | Status: DC
Start: 2020-01-18 — End: 2020-02-01

## 2020-01-18 MED ORDER — SILDENAFIL CITRATE 20 MG PO TABS: 10.0000 mg | ORAL_TABLET | Freq: Every day | ORAL | 1 refills | Status: DC

## 2020-01-18 NOTE — Telephone Encounter (Signed)
Medication sent to pharmacy  

## 2020-01-27 ENCOUNTER — Telehealth: Payer: Self-pay | Admitting: Family Medicine

## 2020-01-27 NOTE — Telephone Encounter (Signed)
Caller: Riki Rusk ([facility]) Call back # [phone] Fax number: [phone]  Att: PAH  Need a copy of patient medication list.

## 2020-01-27 NOTE — Telephone Encounter (Signed)
Medication list sent.

## 2020-02-01 ENCOUNTER — Telehealth: Payer: Self-pay | Admitting: Family Medicine

## 2020-02-01 DIAGNOSIS — I7300 Raynaud's syndrome without gangrene: Secondary | ICD-10-CM

## 2020-02-01 MED ORDER — SILDENAFIL CITRATE 20 MG PO TABS: 10.0000 mg | ORAL_TABLET | Freq: Every day | ORAL | 1 refills | Status: DC

## 2020-02-01 MED ORDER — LORATADINE 10 MG PO TABS: 10.0000 mg | ORAL_TABLET | Freq: Every day | ORAL | 1 refills | Status: DC

## 2020-02-01 MED ORDER — PRIMIDONE 50 MG PO TABS
50.0000 mg | ORAL_TABLET | Freq: Two times a day (BID) | ORAL | 1 refills | Status: DC
Start: 2020-02-01 — End: 2020-08-21

## 2020-02-01 MED ORDER — TERAZOSIN HCL 1 MG PO CAPS
1.0000 mg | ORAL_CAPSULE | Freq: Two times a day (BID) | ORAL | 1 refills | Status: DC
Start: 2020-02-01 — End: 2020-08-21

## 2020-02-01 MED ORDER — PRAVASTATIN SODIUM 40 MG PO TABS
20.0000 mg | ORAL_TABLET | Freq: Every day | ORAL | 1 refills | Status: DC
Start: 2020-02-01 — End: 2020-08-21

## 2020-02-01 MED ORDER — AMLODIPINE BESYLATE 5 MG PO TABS: 5.0000 mg | ORAL_TABLET | Freq: Every day | ORAL | 1 refills | Status: DC

## 2020-02-01 MED ORDER — OMEPRAZOLE 20 MG PO CPDR: 20.0000 mg | DELAYED_RELEASE_CAPSULE | Freq: Two times a day (BID) | ORAL | 1 refills | Status: DC

## 2020-02-01 NOTE — Telephone Encounter (Signed)
He is out of amLODipine (NORVASC) 5 MG tablet [[id number]]   Patient states he change insurance company. Needs all his medication to be sent to [facility] mail order. (was unable to add pharmacy to preferred system not working)   [place], , [place], [address]   [phone]   Patient would like for all medication to be a 90 day supply.  [employer] need a copy of medication list along with npi number.

## 2020-02-01 NOTE — Telephone Encounter (Signed)
Rxs sent

## 2020-02-08 ENCOUNTER — Other Ambulatory Visit: Payer: Self-pay

## 2020-02-08 MED ORDER — MELOXICAM 15 MG PO TABS: 15.0000 mg | ORAL_TABLET | Freq: Every day | ORAL | 3 refills | Status: DC

## 2020-02-08 NOTE — Telephone Encounter (Signed)
[employer] sent a new Rx refill request for Meloxicam 15 mg did not see that it was something you filled before. Is it something you are willing to refill.

## 2020-02-14 ENCOUNTER — Telehealth: Payer: Self-pay | Admitting: Family Medicine

## 2020-02-14 DIAGNOSIS — H903 Sensorineural hearing loss, bilateral: Secondary | ICD-10-CM | POA: Diagnosis not present

## 2020-02-14 NOTE — Telephone Encounter (Signed)
Patient states Johnathan [name] [phone]) need a prior - authorization for Sildenafil

## 2020-02-15 NOTE — Telephone Encounter (Signed)
Called patient. No answer. Left message for patient to call back.   Tried submitting request on cover my meds however "no eligibility was found". I am needing to see if patient changed insurances with the new year or if his policy has changed.

## 2020-02-16 ENCOUNTER — Encounter: Payer: Self-pay | Admitting: Family Medicine

## 2020-02-19 NOTE — Progress Notes (Signed)
[facility] [address] [place], [place] [address] [phone]  Date:  02/21/2020   Name:  Johnathan [name]   DOB:  1932-02-01   MRN:  [id number]  PCP:  Darreld Mclean, MD    Chief Complaint: No chief complaint on file.   History of Present Illness:  Johnathan [name] is a 87 y.o. very pleasant male patient who presents with the following:  Patient is here today to discuss labs-telephone only, we were unable to get video to work Virtual visit today as pt has covid Pt location is home, provider is at office  The patient and myself are present on the call today, he gives consent for virtual visit today Last seen by myself in November-at that time we mostly discussed his Raynaud's phenomenon History of COPD, diabetes, hypertension, GERD- per pt he has PRE-diabetes only? Also history of diverticulitis, hernia repair with mesh earlier this year  Most recent lab work in September.  At that time A1c was 6.3  Eye exam COVID-19 booster- this was done in October  Tetanus vaccine Can update A1c if he would like  Patient was seen in the ER yesterday with shortness of breath and cough.  He was diagnosed with COVID-19 He was given Decadron and was started on 5 days of prednisone, referred to COVID-19 treatment clinic  Pt notes that Saturday was "a rough day" but he feels much better now (Monday) He notes stable SOB today-the same shortness of breath he has noted with exertion for years No fever  He notes that his Raynaud's has actually not been problematic right now despite the cold weather He does wear gloves when he is out in the cold He notes dry skin- he has cetaphil on hand that he will try   We had planned to do an A1c but can do at his next visit this spring   Patient Active Problem List   Diagnosis Date Noted  . Shingles 11/14/2019  . Raynaud's syndrome 11/14/2019  . Skin cancer 11/14/2019  . Left inguinal  hernia s/p lap repair w mesh 05/06/2019 05/06/2019  . Recurrent right inguinal hernia s/p lap repair w mesh 05/06/2019 03/01/2019  . Diabetes mellitus ([place]) 03/01/2019  . Pneumonia 04/21/2014  . COPD (chronic obstructive pulmonary disease) ([place]) 04/21/2014    Past Medical History:  Diagnosis Date  . COPD (chronic obstructive pulmonary disease) ([place])   . Diverticulosis   . DM type 2 (diabetes mellitus, type 2) ([place])   . GERD (gastroesophageal reflux disease)    uses prilosec   . Hypertension   . Pneumonia 2018  . Raynaud's disease   . Shortness of breath dyspnea    WITH EXERTION  . Sinusitis   . Tremors of nervous system     Past Surgical History:  Procedure Laterality Date  . COLONOSCOPY  03/27/2005  . HERNIA REPAIR    . INGUINAL HERNIA REPAIR N/A 05/06/2019   Procedure: LAPAROSCOPIC RIGHT AND LEFT INGUINAL HERNIA(S) REPAIR;  Surgeon: Michael Boston, MD;  Location: [facility];  Service: General;  Laterality: N/A;    Social History   Tobacco Use  . Smoking status: Former Smoker    Packs/day: 1.00    Years: 45.00    Pack years: 45.00    Types: Cigarettes    Quit date: 01/21/1994    Years since quitting: 26.0  . Smokeless tobacco: Never Used  Substance Use Topics  .  Alcohol use: Yes    Comment: a drink daily with dinner  . Drug use: No    Family History  Problem Relation Age of Onset  . Dementia Brother   . Diabetes Brother   . Heart attack Father   . Dementia Father     No Known Allergies  Medication list has been reviewed and updated.  Current Outpatient Medications on File Prior to Visit  Medication Sig Dispense Refill  . albuterol (PROVENTIL HFA;VENTOLIN HFA) 108 (90 Base) MCG/ACT inhaler Inhale 2 puffs into the lungs every 4 (four) hours as needed for wheezing or shortness of breath (or coughing). 1 Inhaler 0  . amLODipine (NORVASC) 5 MG tablet Take 1 tablet (5 mg total) by mouth daily. 90 tablet 1  . aspirin EC 81 MG tablet Take 81 mg by  mouth daily.    . budesonide-formoterol (SYMBICORT) 160-4.5 MCG/ACT inhaler Inhale 2 puffs into the lungs 2 (two) times daily. 1 Inhaler 12  . Cyanocobalamin (B-12) 1000 MCG CAPS Take 1 capsule by mouth daily.    . Flaxseed, Linseed, (FLAXSEED OIL) 1200 MG CAPS Take 1 capsule by mouth daily.    . Ginkgo Biloba EXTR Take 1 tablet by mouth daily.    Marland Kitchen loratadine (CLARITIN) 10 MG tablet Take 1 tablet (10 mg total) by mouth daily. 90 tablet 1  . meloxicam (MOBIC) 15 MG tablet Take 1 tablet (15 mg total) by mouth daily. Use as needed for joint pain 30 tablet 3  . metFORMIN (GLUCOPHAGE) 500 MG tablet Take 1 tablet by mouth daily. 1/2 tab daily    . Misc Natural Products (HIMALAYAN GOJI PO) Take 1 tablet by mouth in the morning and at bedtime.     . Multiple Vitamin (MULTIVITAMIN WITH MINERALS) TABS tablet Take 1 tablet by mouth daily.    Marland Kitchen omeprazole (PRILOSEC) 20 MG capsule Take 1 capsule (20 mg total) by mouth 2 (two) times daily. 180 capsule 1  . pravastatin (PRAVACHOL) 40 MG tablet Take 0.5 tablets (20 mg total) by mouth daily. Takes 1/2 pill daily 45 tablet 1  . primidone (MYSOLINE) 50 MG tablet Take 1 tablet (50 mg total) by mouth 2 (two) times daily. 180 tablet 1  . sildenafil (REVATIO) 20 MG tablet Take 0.5 tablets (10 mg total) by mouth daily. Use for Raynaud's symptoms 45 tablet 1  . terazosin (HYTRIN) 1 MG capsule Take 1 capsule (1 mg total) by mouth 2 (two) times daily. 180 capsule 1  . Tetrahydrozoline HCl (VISINE OP) Apply 1 drop to eye daily.     No current facility-administered medications on file prior to visit.    Review of Systems:  As per HPI- otherwise negative.   Physical Examination: There were no vitals filed for this visit. There were no vitals filed for this visit. There is no height or weight on file to calculate BMI. Ideal Body Weight:    Spoke with patient on the telephone.  He sounds well, his normal self.  No distress or shortness of breath is  noted   Assessment and Plan: Pre-diabetes  COVID-19  Virtual visit today to discuss recent diagnosis of COVID-19, also prediabetes.  The patient is planned to see me in April, we agreed to check his A1c at that time.  Fortunately he has been vaccinated and boosted against COVID-19 so his current illness is mild.  He is asked to let me know if I can do nothing further as far as his symptoms or if he starts to  get worse Spoke with patient for 8 minutes today This visit occurred during the SARS-CoV-2 public health emergency.  Safety protocols were in place, including screening questions prior to the visit, additional usage of staff PPE, and extensive cleaning of exam room while observing appropriate contact time as indicated for disinfecting solutions.    Signed Lamar Blinks, MD

## 2020-02-19 NOTE — Patient Instructions (Signed)
It was great to see you again today.  Assuming all is well, we can plan to visit in 4 to 6 months

## 2020-02-20 ENCOUNTER — Other Ambulatory Visit: Payer: Self-pay

## 2020-02-20 ENCOUNTER — Emergency Department (HOSPITAL_COMMUNITY): Payer: Medicare HMO

## 2020-02-20 ENCOUNTER — Emergency Department (HOSPITAL_COMMUNITY)
Admission: EM | Admit: 2020-02-20 | Discharge: 2020-02-20 | Disposition: A | Discharge status: Home / Self Care | Payer: Medicare HMO | Attending: Emergency Medicine | Admitting: Emergency Medicine

## 2020-02-20 ENCOUNTER — Encounter (HOSPITAL_COMMUNITY): Payer: Self-pay | Admitting: Emergency Medicine

## 2020-02-20 DIAGNOSIS — I10 Essential (primary) hypertension: Secondary | ICD-10-CM | POA: Diagnosis not present

## 2020-02-20 DIAGNOSIS — Z87891 Personal history of nicotine dependence: Secondary | ICD-10-CM | POA: Diagnosis not present

## 2020-02-20 DIAGNOSIS — J449 Chronic obstructive pulmonary disease, unspecified: Secondary | ICD-10-CM | POA: Diagnosis not present

## 2020-02-20 DIAGNOSIS — U071 COVID-19: Secondary | ICD-10-CM | POA: Insufficient documentation

## 2020-02-20 DIAGNOSIS — T85511A Breakdown (mechanical) of esophageal anti-reflux device, initial encounter: Secondary | ICD-10-CM | POA: Diagnosis not present

## 2020-02-20 DIAGNOSIS — Z7982 Long term (current) use of aspirin: Secondary | ICD-10-CM | POA: Insufficient documentation

## 2020-02-20 DIAGNOSIS — Z7951 Long term (current) use of inhaled steroids: Secondary | ICD-10-CM | POA: Diagnosis not present

## 2020-02-20 DIAGNOSIS — Z7984 Long term (current) use of oral hypoglycemic drugs: Secondary | ICD-10-CM | POA: Insufficient documentation

## 2020-02-20 DIAGNOSIS — R0602 Shortness of breath: Secondary | ICD-10-CM | POA: Diagnosis not present

## 2020-02-20 DIAGNOSIS — E119 Type 2 diabetes mellitus without complications: Secondary | ICD-10-CM | POA: Insufficient documentation

## 2020-02-20 DIAGNOSIS — R059 Cough, unspecified: Secondary | ICD-10-CM | POA: Diagnosis not present

## 2020-02-20 DIAGNOSIS — R000 Tachycardia, unspecified: Secondary | ICD-10-CM | POA: Diagnosis not present

## 2020-02-20 DIAGNOSIS — Z79899 Other long term (current) drug therapy: Secondary | ICD-10-CM | POA: Diagnosis not present

## 2020-02-20 LAB — CBC WITH DIFFERENTIAL/PLATELET
Abs Immature Granulocytes: 0.01 10*3/uL (ref 0.00–0.07)
Basophils Absolute: 0 10*3/uL (ref 0.0–0.1)
Basophils Relative: 0 %
Eosinophils Absolute: 0.2 10*3/uL (ref 0.0–0.5)
Eosinophils Relative: 3 %
HCT: 41.2 % (ref 39.0–52.0)
Hemoglobin: 13.6 g/dL (ref 13.0–17.0)
Immature Granulocytes: 0 %
Lymphocytes Relative: 22 %
Lymphs Abs: 1.2 10*3/uL (ref 0.7–4.0)
MCH: 32.4 pg (ref 26.0–34.0)
MCHC: 33 g/dL (ref 30.0–36.0)
MCV: 98.1 fL (ref 80.0–100.0)
Monocytes Absolute: 0.9 10*3/uL (ref 0.1–1.0)
Monocytes Relative: 16 %
Neutro Abs: 3.2 10*3/uL (ref 1.7–7.7)
Neutrophils Relative %: 59 %
Platelets: 162 10*3/uL (ref 150–400)
RBC: 4.2 MIL/uL — ABNORMAL LOW (ref 4.22–5.81)
RDW: 13.6 % (ref 11.5–15.5)
WBC: 5.5 10*3/uL (ref 4.0–10.5)
nRBC: 0 % (ref 0.0–0.2)

## 2020-02-20 LAB — COMPREHENSIVE METABOLIC PANEL
ALT: 18 U/L (ref 0–44)
AST: 23 U/L (ref 15–41)
Albumin: 4.1 g/dL (ref 3.5–5.0)
Alkaline Phosphatase: 70 U/L (ref 38–126)
Anion gap: 10 (ref 5–15)
BUN: 23 mg/dL (ref 8–23)
CO2: 25 mmol/L (ref 22–32)
Calcium: 9.1 mg/dL (ref 8.9–10.3)
Chloride: 101 mmol/L (ref 98–111)
Creatinine, Ser: 1.13 mg/dL (ref 0.61–1.24)
GFR, Estimated: >60 mL/min (ref >60)
Glucose, Bld: 104 mg/dL — ABNORMAL HIGH (ref 70–99)
Potassium: 4.3 mmol/L (ref 3.5–5.1)
Sodium: 136 mmol/L (ref 135–145)
Total Bilirubin: 0.6 mg/dL (ref 0.3–1.2)
Total Protein: 7.5 g/dL (ref 6.5–8.1)

## 2020-02-20 LAB — BRAIN NATRIURETIC PEPTIDE: B Natriuretic Peptide: 75.9 pg/mL (ref 0.0–100.0)

## 2020-02-20 LAB — TROPONIN I (HIGH SENSITIVITY): Troponin I (High Sensitivity): 10 ng/L (ref <18)

## 2020-02-20 LAB — POC SARS CORONAVIRUS 2 AG -  ED: SARS Coronavirus 2 Ag: POSITIVE — AB

## 2020-02-20 MED ORDER — ONDANSETRON HCL 4 MG PO TABS: 4.0000 mg | ORAL_TABLET | Freq: Three times a day (TID) | ORAL | 0 refills | Status: DC | PRN

## 2020-02-20 MED ORDER — DEXAMETHASONE SODIUM PHOSPHATE 10 MG/ML IJ SOLN
10.0000 mg | Freq: Once | INTRAMUSCULAR | Status: DC
  Filled 2020-02-20: qty 1

## 2020-02-20 MED ORDER — BENZONATATE 100 MG PO CAPS: 100.0000 mg | ORAL_CAPSULE | Freq: Three times a day (TID) | ORAL | 0 refills | Status: DC | PRN

## 2020-02-20 MED ORDER — ALBUTEROL SULFATE HFA 108 (90 BASE) MCG/ACT IN AERS
2.0000 | INHALATION_SPRAY | Freq: Once | RESPIRATORY_TRACT | Status: AC
  Administered 2020-02-20: 2 via RESPIRATORY_TRACT
  Filled 2020-02-20: qty 6.7

## 2020-02-20 MED ORDER — PREDNISONE 20 MG PO TABS: 40.0000 mg | ORAL_TABLET | Freq: Every day | ORAL | 0 refills | Status: DC

## 2020-02-20 MED ORDER — DEXAMETHASONE SODIUM PHOSPHATE 10 MG/ML IJ SOLN
10.0000 mg | Freq: Once | INTRAMUSCULAR | Status: AC
  Administered 2020-02-20: 10 mg via INTRAMUSCULAR

## 2020-02-20 NOTE — Discharge Instructions (Addendum)
You came to the emergency department today with reports of Covid-19 like symptoms.   You tested positive for COVID-19. Please isolate at home for at least 5 days after the day your symptoms initially began, and THEN at least 24 hours after you are fever-free without the help of medications (Tylenol/acetaminophen and Advil/ibuprofen/Motrin) AND your symptoms are improving.  You will need to self isolate until Wednesday 02/23/20.    You can alternate Tylenol/acetaminophen and Advil/ibuprofen/Motrin every 4 hours for sore throat, body aches, headache or fever.  Drink plenty of water.  Use saline nasal spray for congestion. You can take Tessalon every 8 hours as needed for cough. You can take Zofran every 8 hours as needed for nausea and vomiting. Wash your hands frequently. Please rest as needed with frequent repositioning and ambulation as tolerated.    If you use a CPAP or BiPAP device for management of obstructive sleep apnea may continue to use it however use it when isolated from other individuals to avoid spread of COVID-19.   If you use a nebulizer administer medication such as albuterol you may continue to use it however only one isolated from other individuals to avoid the spread of COVID-19.  If your symptoms do not improve please follow-up with your primary care provider or urgent care.  Return to the ER for significant shortness of breath, uncontrollable vomiting, severe chest pain, inability to tolerate fluids, changes in mental status such as confusion or other concerning symptoms.

## 2020-02-20 NOTE — ED Triage Notes (Signed)
Patient c/o cough with SOB x2 days. Hx COPD.

## 2020-02-20 NOTE — ED Notes (Signed)
Pt ambulatory to bathroom, standby assistance.  

## 2020-02-20 NOTE — ED Provider Notes (Signed)
[facility] DEPT Provider Note   CSN: [id number] Arrival date & time: 02/20/20  1145     History Chief Complaint  Patient presents with  . Cough    Johnathan [name] is a 87 y.o. male with history of COPD, hypertension, diabetes type 2, GERD.  Patient is with a chief complaint of cough, cough started Friday evening, is productive, has progressively worsened, no alleviating or aggravating factors.  He also endorses shortness of breath.  He states that he has exertional shortness of breath at baseline however this has been worse over the last 3 days.  Patient reports that he has been using his rescue inhaler with minimal relief of symptoms.  Reports last using albuterol inhaler yesterday.  Also endorses left thoracic back pain that wraps around to his left flank flank, he rates his pain as a 1 out of 10 on the pain scale, no alleviating or aggravating factors, no change in pain with exertion or inhalation.    Patient also endorses chills, sore throat, and rhinorrhea.  Denies any recent sick contacts.  Patient reports vaccination for COVID-19 x2 and booster.    HPI     Past Medical History:  Diagnosis Date  . COPD (chronic obstructive pulmonary disease) ([place])   . Diverticulosis   . DM type 2 (diabetes mellitus, type 2) ([place])   . GERD (gastroesophageal reflux disease)    uses prilosec   . Hypertension   . Pneumonia 2018  . Raynaud's disease   . Shortness of breath dyspnea    WITH EXERTION  . Sinusitis   . Tremors of nervous system     Patient Active Problem List   Diagnosis Date Noted  . Shingles 11/14/2019  . Raynaud's syndrome 11/14/2019  . Skin cancer 11/14/2019  . Left inguinal hernia s/p lap repair w mesh 05/06/2019 05/06/2019  . Recurrent right inguinal hernia s/p lap repair w mesh 05/06/2019 03/01/2019  . Diabetes mellitus ([place]) 03/01/2019  . Pneumonia 04/21/2014  . COPD (chronic obstructive pulmonary disease) ([place]) 04/21/2014    Past  Surgical History:  Procedure Laterality Date  . COLONOSCOPY  03/27/2005  . HERNIA REPAIR    . INGUINAL HERNIA REPAIR N/A 05/06/2019   Procedure: LAPAROSCOPIC RIGHT AND LEFT INGUINAL HERNIA(S) REPAIR;  Surgeon: Michael Boston, MD;  Location: [facility];  Service: General;  Laterality: N/A;       Family History  Problem Relation Age of Onset  . Dementia Brother   . Diabetes Brother   . Heart attack Father   . Dementia Father     Social History   Tobacco Use  . Smoking status: Former Smoker    Packs/day: 1.00    Years: 45.00    Pack years: 45.00    Types: Cigarettes    Quit date: 01/21/1994    Years since quitting: 26.0  . Smokeless tobacco: Never Used  Substance Use Topics  . Alcohol use: Yes    Comment: a drink daily with dinner  . Drug use: No    Home Medications Prior to Admission medications   Medication Sig Start Date End Date Taking? Authorizing Provider  albuterol (PROVENTIL HFA;VENTOLIN HFA) 108 (90 Base) MCG/ACT inhaler Inhale 2 puffs into the lungs every 4 (four) hours as needed for wheezing or shortness of breath (or coughing). 05/30/30   Delora Fuel, MD  amLODipine (NORVASC) 5 MG tablet Take 1 tablet (5 mg total) by mouth daily. 02/01/20   Copland, Gay Filler, MD  aspirin EC  81 MG tablet Take 81 mg by mouth daily.    [provider]  budesonide-formoterol (SYMBICORT) 160-4.5 MCG/ACT inhaler Inhale 2 puffs into the lungs 2 (two) times daily. 04/23/14   [name], MD  Cyanocobalamin (B-12) 1000 MCG CAPS Take 1 capsule by mouth daily.    [provider]  Flaxseed, Linseed, (FLAXSEED OIL) 1200 MG CAPS Take 1 capsule by mouth daily.    [provider]  Ginkgo Biloba EXTR Take 1 tablet by mouth daily.    [provider]  loratadine (CLARITIN) 10 MG tablet Take 1 tablet (10 mg total) by mouth daily. 02/01/20   Copland, Gay Filler, MD  meloxicam (MOBIC) 15 MG tablet Take 1 tablet (15 mg total) by mouth daily. Use as needed for  joint pain 02/08/20   Copland, Gay Filler, MD  metFORMIN (GLUCOPHAGE) 500 MG tablet Take 1 tablet by mouth daily. 1/2 tab daily 02/13/17   [provider]  Misc Natural Products (HIMALAYAN GOJI PO) Take 1 tablet by mouth in the morning and at bedtime.     [provider]  Multiple Vitamin (MULTIVITAMIN WITH MINERALS) TABS tablet Take 1 tablet by mouth daily.    [provider]  omeprazole (PRILOSEC) 20 MG capsule Take 1 capsule (20 mg total) by mouth 2 (two) times daily. 02/01/20   Copland, Gay Filler, MD  pravastatin (PRAVACHOL) 40 MG tablet Take 0.5 tablets (20 mg total) by mouth daily. Takes 1/2 pill daily 02/01/20   Copland, Gay Filler, MD  primidone (MYSOLINE) 50 MG tablet Take 1 tablet (50 mg total) by mouth 2 (two) times daily. 02/01/20   Copland, Gay Filler, MD  sildenafil (REVATIO) 20 MG tablet Take 0.5 tablets (10 mg total) by mouth daily. Use for Raynaud's symptoms 02/01/20   Copland, Gay Filler, MD  terazosin (HYTRIN) 1 MG capsule Take 1 capsule (1 mg total) by mouth 2 (two) times daily. 02/01/20   Copland, Gay Filler, MD  Tetrahydrozoline HCl (VISINE OP) Apply 1 drop to eye daily.    [provider]    Allergies    Patient has no known allergies.  Review of Systems   Review of Systems  Constitutional: Positive for chills. Negative for fever.  HENT: Positive for rhinorrhea and sore throat.   Eyes: Negative for visual disturbance.  Respiratory: Positive for cough (nonproductive) and shortness of breath.   Cardiovascular: Negative for chest pain and leg swelling.  Gastrointestinal: Negative for abdominal pain, nausea and vomiting.  Genitourinary: Negative for difficulty urinating and dysuria.  Musculoskeletal: Positive for back pain. Negative for neck pain.  Skin: Negative for color change and rash.  Neurological: Negative for dizziness, syncope, light-headedness and headaches.  Psychiatric/Behavioral: Negative for confusion.    Physical Exam Updated  Vital Signs BP (!) 145/60   Pulse 87   Temp 98 F (36.7 C) (Oral)   Resp 20   SpO2 94%   Physical Exam Vitals and nursing note reviewed.  Constitutional:      General: He is not in acute distress.    Appearance: He is not toxic-appearing or diaphoretic.  HENT:     Head: Normocephalic.  Eyes:     General: No scleral icterus.       Right eye: No discharge.        Left eye: No discharge.  Cardiovascular:     Rate and Rhythm: Normal rate.  Pulmonary:     Effort: Prolonged expiration present. No tachypnea, bradypnea or respiratory distress.  Breath sounds: Normal breath sounds. No wheezing, rhonchi or rales.     Comments: Able to speak in full complete sentences however increased effort of breathing Abdominal:     General: There is no distension.     Palpations: Abdomen is soft.     Tenderness: There is no abdominal tenderness.  Musculoskeletal:     Cervical back: Neck supple.     Right lower leg: No swelling, deformity or tenderness. No edema.     Left lower leg: No swelling, deformity or tenderness. No edema.  Skin:    General: Skin is warm and dry.     Coloration: Skin is not jaundiced or pale.  Neurological:     General: No focal deficit present.     Mental Status: He is alert.  Psychiatric:        Behavior: Behavior is cooperative.     ED Results / Procedures / Treatments   Labs (all labs ordered are listed, but only abnormal results are displayed) Labs Reviewed  CBC WITH DIFFERENTIAL/PLATELET - Abnormal; Notable for the following components:      Result Value   RBC 4.20 (*)    All other components within normal limits  COMPREHENSIVE METABOLIC PANEL - Abnormal; Notable for the following components:   Glucose, Bld 104 (*)    All other components within normal limits  POC SARS CORONAVIRUS 2 AG -  ED - Abnormal; Notable for the following components:   SARS Coronavirus 2 Ag POSITIVE (*)    All other components within normal limits  BRAIN NATRIURETIC PEPTIDE   TROPONIN I (HIGH SENSITIVITY)    EKG EKG Interpretation  Date/Time:  Sunday February 20 2020 12:08:14 EST Ventricular Rate:  91 PR Interval:    QRS Duration: 96 QT Interval:  352 QTC Calculation: 433 R Axis:   74 Text Interpretation: Sinus tachycardia Ventricular trigeminy , new since last tracing Borderline short PR interval Probable left atrial enlargement Nonspecific repol abnormality, inferior leads 12 Lead; Mason-Likar Poor data quality Confirmed by Dorie Rank [phone]) on 02/20/2020 1:02:41 PM   Radiology DG Chest 2 View  Result Date: 02/20/2020 CLINICAL DATA:  Productive cough with shortness of breath for 2 days. History of COPD. EXAM: CHEST - 2 VIEW COMPARISON:  Chest x-ray dated 02/04/2019. FINDINGS: Heart size and mediastinal contours are stable. Atherosclerosis at the aortic arch. Lungs are hyperexpanded. Lungs are clear. No pleural effusion or pneumothorax is seen. No acute appearing osseous abnormality. IMPRESSION: 1. No active cardiopulmonary disease. No evidence of pneumonia or pulmonary edema. 2. COPD. 3. Aortic atherosclerosis. Electronically Signed   By: Franki Cabot M.D.   On: 02/20/2020 12:48    Procedures Procedures   Medications Ordered in ED Medications  albuterol (VENTOLIN HFA) 108 (90 Base) MCG/ACT inhaler 2 puff (2 puffs Inhalation Given 02/20/20 1341)  dexamethasone (DECADRON) injection 10 mg (10 mg Intramuscular Given 02/20/20 1346)    ED Course  I have reviewed the triage vital signs and the nursing notes.  Pertinent labs & imaging results that were available during my care of the patient were reviewed by me and considered in my medical decision making (see chart for details).    MDM Rules/Calculators/A&P                          Alert 87 year old male, in no acute distress.  Patient able to speak in full complete sentences however increased effort of breathing.  Presents with chief complaint of cough and  worsening shortness of breath.  Lungs clear to  auscultation bilaterally.    He has baseline shortness of breath with exertion.  Also endorses chills, rhinorrhea, sore throat.  Patient denies any recent sick contacts, patient reports COVID-19 vaccination x2 and booster.   With patient's symptoms concern for COVID-19 versus COPD exacerbation versus URI.  Chest x-ray, COVID-19, CBC and CMP pending.  With patient's complaint of back pain associated with shortness of breath; concern for anginal equivalent.  EKG, troponin, BNP ordered and pending.  Albuterol inhaler ordered for patient.  Patient positive for COVID-19.  Troponin 10, no ST or T wave changes on EKG; less concerning for ACS.  BNP 75.9; concerning for acute heart failure.  Chest x-ray showed no active cardiopulmonary disease, no evidence of pneumonia or pulmonary edema.  Patient's cough and worsening shortness of breath likely due to his positive COVID-19.  Patient was given 10 mg Decadron, due to shortness of breath, history of COPD and positive Covid test.    Patient was ambulated and SPO2 was noted to decrease to 90% on room air, he was able to speak without difficulties after ambulation.  Patient is not a candidate for admission at this time.  Will prescribe 5-day course of prednisone, Tessalon, and Zofran.  Will refer patient to ambulatory COVID-19 treatment clinic.  Patient to self isolate at home.   Discussed results, findings, treatment and follow up. Patient advised of return precautions. Patient verbalized understanding and agreed with plan.  Patient was discussed with and evaluated by Dr. Gilford Raid.  Johnathan [name] was evaluated in Emergency Department on 02/20/2020 for the symptoms described in the history of present illness. He was evaluated in the context of the global COVID-19 pandemic, which necessitated consideration that the patient might be at risk for infection with the SARS-CoV-2 virus that causes COVID-19. Institutional protocols and algorithms that pertain to the  evaluation of patients at risk for COVID-19 are in a state of rapid change based on information released by regulatory bodies including the CDC and federal and state organizations. These policies and algorithms were followed during the patient's care in the ED.    Final Clinical Impression(s) / ED Diagnoses Final diagnoses:  COVID    Rx / DC Orders ED Discharge Orders         Ordered    Ambulatory referral for Covid Treatment        02/20/20 1458    benzonatate (TESSALON) 100 MG capsule  Every 8 hours PRN        02/20/20 1458    ondansetron (ZOFRAN) 4 MG tablet  Every 8 hours PRN        02/20/20 1458    predniSONE (DELTASONE) 20 MG tablet  Daily with breakfast        02/20/20 1504           Dyann Ruddle 02/20/20 2114    Isla Pence, MD 02/20/20 2200

## 2020-02-20 NOTE — ED Notes (Signed)
Pt dropped to 90 percent when ambulating.

## 2020-02-21 ENCOUNTER — Telehealth (HOSPITAL_COMMUNITY): Payer: Self-pay | Admitting: Adult Health

## 2020-02-21 ENCOUNTER — Telehealth (HOSPITAL_COMMUNITY): Payer: Self-pay | Admitting: Family

## 2020-02-21 ENCOUNTER — Telehealth (INDEPENDENT_AMBULATORY_CARE_PROVIDER_SITE_OTHER): Payer: Medicare HMO | Admitting: Family Medicine

## 2020-02-21 ENCOUNTER — Telehealth: Payer: Self-pay | Admitting: Physician Assistant

## 2020-02-21 DIAGNOSIS — R7303 Prediabetes: Secondary | ICD-10-CM

## 2020-02-21 DIAGNOSIS — U071 COVID-19: Secondary | ICD-10-CM

## 2020-02-21 NOTE — Telephone Encounter (Signed)
I called Johnathan [name] about his symptoms since his [place] visit in the ER.  He noted that he was doing how he is supposed to be doing, and that he had a call from a doctor so he had to get off the phone and the call was  disconnected.    I sent a my chart message to him to discuss treatment with monoclonal antibodies or oral antivirals for COVID19.    Wilber Bihari, NP

## 2020-02-21 NOTE — Telephone Encounter (Signed)
Called to discuss with patient about COVID-19 symptoms and the use of one of the available treatments for those with mild to moderate Covid symptoms and at a high risk of hospitalization.  Pt appears to qualify for outpatient treatment due to co-morbid conditions and/or a member of an at-risk group in accordance with the FDA Emergency Use Authorization.     Unable to reach pt, phone went to VM. 2 previous attempts by other providers.  [employer]

## 2020-02-21 NOTE — Telephone Encounter (Signed)
Called to discuss with patient about COVID-19 symptoms and the use of one of the available treatments for those with mild to moderate Covid symptoms and at a high risk of hospitalization.  Pt appears to qualify for outpatient treatment due to co-morbid conditions and/or a member of an at-risk group in accordance with the FDA Emergency Use Authorization.    Patient stated to call us back again. Did not provided any informations. This morning another APP from "COVID [facility]" was not able to complete the conversation (see separate note).   Will keep on call back list.   Leanor Kail, PAC

## 2020-02-25 ENCOUNTER — Other Ambulatory Visit: Payer: Self-pay

## 2020-02-25 ENCOUNTER — Inpatient Hospital Stay (HOSPITAL_COMMUNITY)
Admission: EM | Admit: 2020-02-25 | Discharge: 2020-02-29 | DRG: 177 | Disposition: A | Discharge status: Home / Self Care | Payer: Medicare HMO | Attending: Internal Medicine | Admitting: Internal Medicine

## 2020-02-25 ENCOUNTER — Encounter (HOSPITAL_COMMUNITY): Payer: Self-pay

## 2020-02-25 ENCOUNTER — Emergency Department (HOSPITAL_COMMUNITY): Payer: Medicare HMO

## 2020-02-25 DIAGNOSIS — Z833 Family history of diabetes mellitus: Secondary | ICD-10-CM | POA: Diagnosis not present

## 2020-02-25 DIAGNOSIS — J9601 Acute respiratory failure with hypoxia: Secondary | ICD-10-CM | POA: Diagnosis not present

## 2020-02-25 DIAGNOSIS — I10 Essential (primary) hypertension: Secondary | ICD-10-CM | POA: Diagnosis not present

## 2020-02-25 DIAGNOSIS — Z87891 Personal history of nicotine dependence: Secondary | ICD-10-CM

## 2020-02-25 DIAGNOSIS — R0902 Hypoxemia: Secondary | ICD-10-CM | POA: Diagnosis not present

## 2020-02-25 DIAGNOSIS — Z791 Long term (current) use of non-steroidal anti-inflammatories (NSAID): Secondary | ICD-10-CM

## 2020-02-25 DIAGNOSIS — Z85828 Personal history of other malignant neoplasm of skin: Secondary | ICD-10-CM | POA: Diagnosis not present

## 2020-02-25 DIAGNOSIS — E872 Acidosis: Secondary | ICD-10-CM | POA: Diagnosis not present

## 2020-02-25 DIAGNOSIS — Z7984 Long term (current) use of oral hypoglycemic drugs: Secondary | ICD-10-CM

## 2020-02-25 DIAGNOSIS — J441 Chronic obstructive pulmonary disease with (acute) exacerbation: Secondary | ICD-10-CM | POA: Diagnosis not present

## 2020-02-25 DIAGNOSIS — E785 Hyperlipidemia, unspecified: Secondary | ICD-10-CM | POA: Diagnosis present

## 2020-02-25 DIAGNOSIS — Z79899 Other long term (current) drug therapy: Secondary | ICD-10-CM

## 2020-02-25 DIAGNOSIS — E119 Type 2 diabetes mellitus without complications: Secondary | ICD-10-CM | POA: Diagnosis present

## 2020-02-25 DIAGNOSIS — R7989 Other specified abnormal findings of blood chemistry: Secondary | ICD-10-CM | POA: Diagnosis not present

## 2020-02-25 DIAGNOSIS — I7300 Raynaud's syndrome without gangrene: Secondary | ICD-10-CM | POA: Diagnosis present

## 2020-02-25 DIAGNOSIS — K219 Gastro-esophageal reflux disease without esophagitis: Secondary | ICD-10-CM | POA: Diagnosis present

## 2020-02-25 DIAGNOSIS — J439 Emphysema, unspecified: Secondary | ICD-10-CM | POA: Diagnosis not present

## 2020-02-25 DIAGNOSIS — J9811 Atelectasis: Secondary | ICD-10-CM | POA: Diagnosis not present

## 2020-02-25 DIAGNOSIS — Z7951 Long term (current) use of inhaled steroids: Secondary | ICD-10-CM

## 2020-02-25 DIAGNOSIS — U071 COVID-19: Principal | ICD-10-CM | POA: Diagnosis present

## 2020-02-25 DIAGNOSIS — Z8249 Family history of ischemic heart disease and other diseases of the circulatory system: Secondary | ICD-10-CM

## 2020-02-25 DIAGNOSIS — Z7982 Long term (current) use of aspirin: Secondary | ICD-10-CM | POA: Diagnosis not present

## 2020-02-25 DIAGNOSIS — Z820 Family history of epilepsy and other diseases of the nervous system: Secondary | ICD-10-CM | POA: Diagnosis not present

## 2020-02-25 LAB — COMPREHENSIVE METABOLIC PANEL
ALT: 25 U/L (ref 0–44)
AST: 30 U/L (ref 15–41)
Albumin: 3.6 g/dL (ref 3.5–5.0)
Alkaline Phosphatase: 65 U/L (ref 38–126)
Anion gap: 13 (ref 5–15)
BUN: 22 mg/dL (ref 8–23)
CO2: 23 mmol/L (ref 22–32)
Calcium: 8.9 mg/dL (ref 8.9–10.3)
Chloride: 98 mmol/L (ref 98–111)
Creatinine, Ser: 1.06 mg/dL (ref 0.61–1.24)
GFR, Estimated: >60 mL/min (ref >60)
Glucose, Bld: 141 mg/dL — ABNORMAL HIGH (ref 70–99)
Potassium: 4.2 mmol/L (ref 3.5–5.1)
Sodium: 134 mmol/L — ABNORMAL LOW (ref 135–145)
Total Bilirubin: 0.4 mg/dL (ref 0.3–1.2)
Total Protein: 7.3 g/dL (ref 6.5–8.1)

## 2020-02-25 LAB — CBC WITH DIFFERENTIAL/PLATELET
Abs Immature Granulocytes: 0.02 10*3/uL (ref 0.00–0.07)
Basophils Absolute: 0 10*3/uL (ref 0.0–0.1)
Basophils Relative: 0 %
Eosinophils Absolute: 0 10*3/uL (ref 0.0–0.5)
Eosinophils Relative: 0 %
HCT: 40.6 % (ref 39.0–52.0)
Hemoglobin: 13.4 g/dL (ref 13.0–17.0)
Immature Granulocytes: 0 %
Lymphocytes Relative: 9 %
Lymphs Abs: 0.5 10*3/uL — ABNORMAL LOW (ref 0.7–4.0)
MCH: 32.1 pg (ref 26.0–34.0)
MCHC: 33 g/dL (ref 30.0–36.0)
MCV: 97.4 fL (ref 80.0–100.0)
Monocytes Absolute: 0.4 10*3/uL (ref 0.1–1.0)
Monocytes Relative: 6 %
Neutro Abs: 5 10*3/uL (ref 1.7–7.7)
Neutrophils Relative %: 85 %
Platelets: 193 10*3/uL (ref 150–400)
RBC: 4.17 MIL/uL — ABNORMAL LOW (ref 4.22–5.81)
RDW: 13.2 % (ref 11.5–15.5)
WBC: 6 10*3/uL (ref 4.0–10.5)
nRBC: 0 % (ref 0.0–0.2)

## 2020-02-25 LAB — PROCALCITONIN: Procalcitonin: <0.1 ng/mL

## 2020-02-25 LAB — CBC
HCT: 38.4 % — ABNORMAL LOW (ref 39.0–52.0)
Hemoglobin: 12.7 g/dL — ABNORMAL LOW (ref 13.0–17.0)
MCH: 32.2 pg (ref 26.0–34.0)
MCHC: 33.1 g/dL (ref 30.0–36.0)
MCV: 97.5 fL (ref 80.0–100.0)
Platelets: 177 10*3/uL (ref 150–400)
RBC: 3.94 MIL/uL — ABNORMAL LOW (ref 4.22–5.81)
RDW: 13.2 % (ref 11.5–15.5)
WBC: 4.1 10*3/uL (ref 4.0–10.5)
nRBC: 0 % (ref 0.0–0.2)

## 2020-02-25 LAB — LACTIC ACID, PLASMA
Lactic Acid, Venous: 2.8 mmol/L (ref 0.5–1.9)
Lactic Acid, Venous: 1.9 mmol/L (ref 0.5–1.9)

## 2020-02-25 LAB — C-REACTIVE PROTEIN: CRP: 6.3 mg/dL — ABNORMAL HIGH (ref <1.0)

## 2020-02-25 LAB — FIBRINOGEN: Fibrinogen: 479 mg/dL — ABNORMAL HIGH (ref 210–475)

## 2020-02-25 LAB — LACTATE DEHYDROGENASE: LDH: 158 U/L (ref 98–192)

## 2020-02-25 LAB — HEMOGLOBIN A1C
Hgb A1c MFr Bld: 6.5 % — ABNORMAL HIGH (ref 4.8–5.6)
Mean Plasma Glucose: 139.85 mg/dL

## 2020-02-25 LAB — CREATININE, SERUM
Creatinine, Ser: 0.86 mg/dL (ref 0.61–1.24)
GFR, Estimated: >60 mL/min (ref >60)

## 2020-02-25 LAB — D-DIMER, QUANTITATIVE: D-Dimer, Quant: 1.02 ug/mL-FEU — ABNORMAL HIGH (ref 0.00–0.50)

## 2020-02-25 LAB — FERRITIN: Ferritin: 63 ng/mL (ref 24–336)

## 2020-02-25 LAB — TRIGLYCERIDES: Triglycerides: 89 mg/dL (ref <150)

## 2020-02-25 MED ORDER — ONDANSETRON HCL 4 MG PO TABS: 4.0000 mg | ORAL_TABLET | Freq: Three times a day (TID) | ORAL | Status: DC | PRN

## 2020-02-25 MED ORDER — ASCORBIC ACID 500 MG PO TABS
500.0000 mg | ORAL_TABLET | Freq: Every day | ORAL | Status: DC
  Administered 2020-02-26 – 2020-02-29 (×4): 500 mg via ORAL
  Filled 2020-02-25 (×4): qty 1

## 2020-02-25 MED ORDER — ALBUTEROL SULFATE HFA 108 (90 BASE) MCG/ACT IN AERS
2.0000 | INHALATION_SPRAY | Freq: Once | RESPIRATORY_TRACT | Status: AC
  Administered 2020-02-25: 2 via RESPIRATORY_TRACT
  Filled 2020-02-25: qty 6.7

## 2020-02-25 MED ORDER — HYDROCOD POLST-CPM POLST ER 10-8 MG/5ML PO SUER
5.0000 mL | Freq: Two times a day (BID) | ORAL | Status: DC | PRN
  Administered 2020-02-26: 5 mL via ORAL
  Filled 2020-02-25: qty 5

## 2020-02-25 MED ORDER — MELOXICAM 15 MG PO TABS: 15.0000 mg | ORAL_TABLET | Freq: Every day | ORAL | Status: DC

## 2020-02-25 MED ORDER — LORATADINE 10 MG PO TABS
10.0000 mg | ORAL_TABLET | Freq: Every day | ORAL | Status: DC
Start: 2020-02-26 — End: 2020-02-29
  Administered 2020-02-26 – 2020-02-29 (×4): 10 mg via ORAL
  Filled 2020-02-25 (×4): qty 1

## 2020-02-25 MED ORDER — SODIUM CHLORIDE 0.9 % IV SOLN
200.0000 mg | Freq: Once | INTRAVENOUS | Status: AC
  Administered 2020-02-25: 200 mg via INTRAVENOUS
  Filled 2020-02-25: qty 200

## 2020-02-25 MED ORDER — PRAVASTATIN SODIUM 20 MG PO TABS
20.0000 mg | ORAL_TABLET | Freq: Every day | ORAL | Status: DC
  Administered 2020-02-26 – 2020-02-29 (×4): 20 mg via ORAL
  Filled 2020-02-25 (×4): qty 1

## 2020-02-25 MED ORDER — ASPIRIN EC 81 MG PO TBEC
81.0000 mg | DELAYED_RELEASE_TABLET | Freq: Every day | ORAL | Status: DC
  Administered 2020-02-26 – 2020-02-29 (×4): 81 mg via ORAL
  Filled 2020-02-25 (×4): qty 1

## 2020-02-25 MED ORDER — VITAMIN B-12 1000 MCG PO TABS
500.0000 ug | ORAL_TABLET | Freq: Every day | ORAL | Status: DC
  Administered 2020-02-26 – 2020-02-29 (×4): 500 ug via ORAL
  Filled 2020-02-25 (×4): qty 1

## 2020-02-25 MED ORDER — SODIUM CHLORIDE 0.9 % IV SOLN: 100.0000 mg | Freq: Every day | INTRAVENOUS | Status: DC

## 2020-02-25 MED ORDER — GUAIFENESIN-DM 100-10 MG/5ML PO SYRP
10.0000 mL | ORAL_SOLUTION | ORAL | Status: DC | PRN
  Administered 2020-02-26: 10 mL via ORAL
  Filled 2020-02-25: qty 10

## 2020-02-25 MED ORDER — MELOXICAM 15 MG PO TABS
15.0000 mg | ORAL_TABLET | Freq: Every day | ORAL | Status: DC | PRN
  Administered 2020-02-28 – 2020-02-29 (×2): 15 mg via ORAL
  Filled 2020-02-25 (×3): qty 1

## 2020-02-25 MED ORDER — B-12 1000 MCG PO CAPS: 1.0000 | ORAL_CAPSULE | Freq: Every day | ORAL | Status: DC

## 2020-02-25 MED ORDER — AMLODIPINE BESYLATE 5 MG PO TABS
5.0000 mg | ORAL_TABLET | Freq: Every day | ORAL | Status: DC
  Administered 2020-02-26 – 2020-02-29 (×4): 5 mg via ORAL
  Filled 2020-02-25 (×4): qty 1

## 2020-02-25 MED ORDER — PREDNISONE 20 MG PO TABS: 50.0000 mg | ORAL_TABLET | Freq: Every day | ORAL | Status: DC

## 2020-02-25 MED ORDER — ZINC SULFATE 220 (50 ZN) MG PO CAPS
220.0000 mg | ORAL_CAPSULE | Freq: Every day | ORAL | Status: DC
  Administered 2020-02-26 – 2020-02-29 (×4): 220 mg via ORAL
  Filled 2020-02-25 (×4): qty 1

## 2020-02-25 MED ORDER — ENOXAPARIN SODIUM 40 MG/0.4ML ~~LOC~~ SOLN
40.0000 mg | SUBCUTANEOUS | Status: DC
  Administered 2020-02-25 – 2020-02-28 (×4): 40 mg via SUBCUTANEOUS
  Filled 2020-02-25 (×4): qty 0.4

## 2020-02-25 MED ORDER — TERAZOSIN HCL 1 MG PO CAPS
1.0000 mg | ORAL_CAPSULE | Freq: Two times a day (BID) | ORAL | Status: DC
  Administered 2020-02-25 – 2020-02-29 (×8): 1 mg via ORAL
  Filled 2020-02-25 (×8): qty 1

## 2020-02-25 MED ORDER — SODIUM CHLORIDE 0.9 % IV SOLN: INTRAVENOUS | Status: DC

## 2020-02-25 MED ORDER — METHYLPREDNISOLONE SODIUM SUCC 40 MG IJ SOLR
0.5000 mg/kg | Freq: Two times a day (BID) | INTRAMUSCULAR | Status: DC
  Administered 2020-02-26 – 2020-02-27 (×4): 34.4 mg via INTRAVENOUS
  Filled 2020-02-25 (×5): qty 1

## 2020-02-25 MED ORDER — SODIUM CHLORIDE 0.9 % IV BOLUS
1000.0000 mL | Freq: Once | INTRAVENOUS | Status: AC
  Administered 2020-02-25: 1000 mL via INTRAVENOUS

## 2020-02-25 MED ORDER — SODIUM CHLORIDE 0.9 % IV SOLN
100.0000 mg | Freq: Every day | INTRAVENOUS | Status: AC
  Administered 2020-02-26 – 2020-02-29 (×4): 100 mg via INTRAVENOUS
  Filled 2020-02-25 (×4): qty 20

## 2020-02-25 MED ORDER — PRIMIDONE 50 MG PO TABS
50.0000 mg | ORAL_TABLET | Freq: Two times a day (BID) | ORAL | Status: DC
  Administered 2020-02-25 – 2020-02-29 (×8): 50 mg via ORAL
  Filled 2020-02-25 (×8): qty 1

## 2020-02-25 MED ORDER — DEXAMETHASONE SODIUM PHOSPHATE 4 MG/ML IJ SOLN
4.0000 mg | Freq: Once | INTRAMUSCULAR | Status: AC
  Administered 2020-02-25: 4 mg via INTRAVENOUS
  Filled 2020-02-25: qty 1

## 2020-02-25 MED ORDER — ADULT MULTIVITAMIN W/MINERALS CH
1.0000 | ORAL_TABLET | Freq: Every day | ORAL | Status: DC
  Administered 2020-02-26 – 2020-02-29 (×4): 1 via ORAL
  Filled 2020-02-25 (×4): qty 1

## 2020-02-25 MED ORDER — TETRAHYDROZOLINE HCL 0.05 % OP SOLN
1.0000 [drp] | Freq: Two times a day (BID) | OPHTHALMIC | Status: DC
  Administered 2020-02-25 – 2020-02-29 (×8): 1 [drp] via OPHTHALMIC
  Filled 2020-02-25: qty 15

## 2020-02-25 MED ORDER — IPRATROPIUM-ALBUTEROL 20-100 MCG/ACT IN AERS
1.0000 | INHALATION_SPRAY | Freq: Four times a day (QID) | RESPIRATORY_TRACT | Status: DC
  Administered 2020-02-25 – 2020-02-28 (×13): 1 via RESPIRATORY_TRACT
  Filled 2020-02-25: qty 4

## 2020-02-25 MED ORDER — SODIUM CHLORIDE 0.9 % IV SOLN: 200.0000 mg | Freq: Once | INTRAVENOUS | Status: DC

## 2020-02-25 NOTE — ED Notes (Signed)
Date and time results received: 02/25/20 12:41 PM  (use smartphrase ".now" to insert current time)  Test: LA Critical Value: 2.8  Name of Provider Notified: Britini PA  Orders Received? Or Actions Taken?:

## 2020-02-25 NOTE — ED Provider Notes (Signed)
[facility] DEPT Provider Note   CSN: [id number] Arrival date & time: 02/25/20  1116     History Chief Complaint  Patient presents with  . Covid Positive    Johnathan [name] is a 87 y.o. male with past medical history significant for COPD, diabetes, chronic shortness of breath with exertion who presents for evaluation of increased shortness of breath.  Patient was seen here in the ED 5 days ago.  Diagnosed with COVID.  Was started on course of prednisone.  Patient did take his dose today.  States he has had increased shortness of breath and nonproductive cough.  States he feels "overall bad."  Generalized weakness. Decreased PO intake. States he cannot catch his breath when he starts coughing.  Denies fever, chills, nausea, vomiting, chest pain, hemoptysis, abdominal pain, diarrhea, dysuria no unilateral leg swelling, redness or warmth.  Denies additional aggravating or alleviating factors.  No supplemental Oxygen at home.  COVID vaccinated and boosted  Symptoms started 1/27    History obtained from patient and past medical records.  No interpreter used  HPI     Past Medical History:  Diagnosis Date  . COPD (chronic obstructive pulmonary disease) ([place])   . Diverticulosis   . DM type 2 (diabetes mellitus, type 2) ([place])   . GERD (gastroesophageal reflux disease)    uses prilosec   . Hypertension   . Pneumonia 2018  . Raynaud's disease   . Shortness of breath dyspnea    WITH EXERTION  . Sinusitis   . Tremors of nervous system     Patient Active Problem List   Diagnosis Date Noted  . Shingles 11/14/2019  . Raynaud's syndrome 11/14/2019  . Skin cancer 11/14/2019  . Left inguinal hernia s/p lap repair w mesh 05/06/2019 05/06/2019  . Recurrent right inguinal hernia s/p lap repair w mesh 05/06/2019 03/01/2019  . Diabetes mellitus ([place]) 03/01/2019  . Pneumonia 04/21/2014  . COPD (chronic obstructive pulmonary disease) ([place]) 04/21/2014    Past  Surgical History:  Procedure Laterality Date  . COLONOSCOPY  03/27/2005  . HERNIA REPAIR    . INGUINAL HERNIA REPAIR N/A 05/06/2019   Procedure: LAPAROSCOPIC RIGHT AND LEFT INGUINAL HERNIA(S) REPAIR;  Surgeon: Michael Boston, MD;  Location: [facility];  Service: General;  Laterality: N/A;       Family History  Problem Relation Age of Onset  . Dementia Brother   . Diabetes Brother   . Heart attack Father   . Dementia Father     Social History   Tobacco Use  . Smoking status: Former Smoker    Packs/day: 1.00    Years: 45.00    Pack years: 45.00    Types: Cigarettes    Quit date: 01/21/1994    Years since quitting: 26.1  . Smokeless tobacco: Never Used  Substance Use Topics  . Alcohol use: Yes    Comment: a drink daily with dinner  . Drug use: No    Home Medications Prior to Admission medications   Medication Sig Start Date End Date Taking? Authorizing Provider  albuterol (PROVENTIL HFA;VENTOLIN HFA) 108 (90 Base) MCG/ACT inhaler Inhale 2 puffs into the lungs every 4 (four) hours as needed for wheezing or shortness of breath (or coughing). [birth year]   Delora Fuel, MD  amLODipine (NORVASC) 5 MG tablet Take 1 tablet (5 mg total) by mouth daily. 02/01/20   Copland, Gay Filler, MD  aspirin EC 81 MG tablet Take 81 mg by mouth daily.  [provider]  benzonatate (TESSALON) 100 MG capsule Take 1 capsule (100 mg total) by mouth every 8 (eight) hours as needed for cough. 02/20/20   Loni Beckwith, PA-C  budesonide-formoterol (SYMBICORT) 160-4.5 MCG/ACT inhaler Inhale 2 puffs into the lungs 2 (two) times daily. 04/23/14   [name], MD  Cyanocobalamin (B-12) 1000 MCG CAPS Take 1 capsule by mouth daily.    [provider]  Flaxseed, Linseed, (FLAXSEED OIL) 1200 MG CAPS Take 1 capsule by mouth daily.    [provider]  Ginkgo Biloba EXTR Take 1 tablet by mouth daily.    [provider]  loratadine (CLARITIN) 10 MG tablet Take 1 tablet  (10 mg total) by mouth daily. 02/01/20   Copland, Gay Filler, MD  meloxicam (MOBIC) 15 MG tablet Take 1 tablet (15 mg total) by mouth daily. Use as needed for joint pain 02/08/20   Copland, Gay Filler, MD  metFORMIN (GLUCOPHAGE) 500 MG tablet Take 1 tablet by mouth daily. 1/2 tab daily 02/13/17   [provider]  Misc Natural Products (HIMALAYAN GOJI PO) Take 1 tablet by mouth in the morning and at bedtime.     [provider]  Multiple Vitamin (MULTIVITAMIN WITH MINERALS) TABS tablet Take 1 tablet by mouth daily.    [provider]  omeprazole (PRILOSEC) 20 MG capsule Take 1 capsule (20 mg total) by mouth 2 (two) times daily. 02/01/20   Copland, Gay Filler, MD  ondansetron (ZOFRAN) 4 MG tablet Take 1 tablet (4 mg total) by mouth every 8 (eight) hours as needed for nausea or vomiting. 02/20/20   Loni Beckwith, PA-C  pravastatin (PRAVACHOL) 40 MG tablet Take 0.5 tablets (20 mg total) by mouth daily. Takes 1/2 pill daily 02/01/20   Copland, Gay Filler, MD  predniSONE (DELTASONE) 20 MG tablet Take 2 tablets (40 mg total) by mouth daily with breakfast for 5 days. 02/20/20 02/25/20  Loni Beckwith, PA-C  primidone (MYSOLINE) 50 MG tablet Take 1 tablet (50 mg total) by mouth 2 (two) times daily. 02/01/20   Copland, Gay Filler, MD  sildenafil (REVATIO) 20 MG tablet Take 0.5 tablets (10 mg total) by mouth daily. Use for Raynaud's symptoms 02/01/20   Copland, Gay Filler, MD  terazosin (HYTRIN) 1 MG capsule Take 1 capsule (1 mg total) by mouth 2 (two) times daily. 02/01/20   Copland, Gay Filler, MD  Tetrahydrozoline HCl (VISINE OP) Apply 1 drop to eye daily.    [provider]    Allergies    Patient has no known allergies.  Review of Systems   Review of Systems  Constitutional: Positive for activity change, appetite change and fatigue. Negative for fever and unexpected weight change.  HENT: Negative.   Respiratory: Positive for cough and shortness of breath.    Cardiovascular: Negative.   Gastrointestinal: Negative.   Genitourinary: Negative.   Musculoskeletal: Negative.   Skin: Negative.   Neurological: Positive for weakness (Generalized). Negative for dizziness, syncope, light-headedness, numbness and headaches.  All other systems reviewed and are negative.   Physical Exam Updated Vital Signs BP 94/84   Pulse 85   Temp 98 F (36.7 C) (Oral)   Resp (!) 22   SpO2 97%   Physical Exam Vitals and nursing note reviewed.  Constitutional:      General: He is not in acute distress.    Appearance: He is well-developed and well-nourished. He is not toxic-appearing or diaphoretic.  HENT:     Head: Normocephalic and atraumatic.  Nose: Nose normal.     Mouth/Throat:     Mouth: Mucous membranes are moist.  Eyes:     Pupils: Pupils are equal, round, and reactive to light.  Cardiovascular:     Rate and Rhythm: Normal rate and regular rhythm.     Pulses: Normal pulses.     Heart sounds: Normal heart sounds.  Pulmonary:     Effort: Pulmonary effort is normal. No respiratory distress.     Comments: Mild course lung sounds.  Speaks in short sentences.  Does appear winded with minimal movement.  Initially hypoxic, currently on 2 L via nasal cannula Abdominal:     General: Bowel sounds are normal. There is no distension.     Palpations: Abdomen is soft. There is no mass.     Tenderness: There is no abdominal tenderness. There is no right CVA tenderness, left CVA tenderness or guarding.  Musculoskeletal:        General: Normal range of motion.     Cervical back: Normal range of motion and neck supple.     Comments: Moves all 4 extremities at difficulty.  No bony tenderness.  Compartments soft  Skin:    General: Skin is warm and dry.     Capillary Refill: Capillary refill takes less than 2 seconds.     Comments: No rashes or lesions  Neurological:     General: No focal deficit present.     Mental Status: He is alert and oriented to person,  place, and time.     Comments: Ambulatory without ataxic gait however does appear significantly winded with mild movement. Cranial nerves II 12 grossly intact  Psychiatric:        Mood and Affect: Mood and affect normal.     ED Results / Procedures / Treatments   Labs (all labs ordered are listed, but only abnormal results are displayed) Labs Reviewed  LACTIC ACID, PLASMA - Abnormal; Notable for the following components:      Result Value   Lactic Acid, Venous 2.8 (*)    All other components within normal limits  CBC WITH DIFFERENTIAL/PLATELET - Abnormal; Notable for the following components:   RBC 4.17 (*)    Lymphs Abs 0.5 (*)    All other components within normal limits  COMPREHENSIVE METABOLIC PANEL - Abnormal; Notable for the following components:   Sodium 134 (*)    Glucose, Bld 141 (*)    All other components within normal limits  D-DIMER, QUANTITATIVE (NOT AT [facility]) - Abnormal; Notable for the following components:   D-Dimer, Quant 1.02 (*)    All other components within normal limits  FIBRINOGEN - Abnormal; Notable for the following components:   Fibrinogen 479 (*)    All other components within normal limits  CULTURE, BLOOD (ROUTINE X 2)  CULTURE, BLOOD (ROUTINE X 2)  LACTATE DEHYDROGENASE  TRIGLYCERIDES  LACTIC ACID, PLASMA  PROCALCITONIN  FERRITIN  C-REACTIVE PROTEIN    EKG EKG Interpretation  Date/Time:  Friday February 25 2020 11:37:49 EST Ventricular Rate:  91 PR Interval:    QRS Duration: 114 QT Interval:  368 QTC Calculation: 453 R Axis:   81 Text Interpretation: Sinus rhythm Ventricular premature complex Borderline intraventricular conduction delay Low voltage with right axis deviation Nonspecific T abnormalities, lateral leads ST elevation, consider anterior injury No significant change since prior 1/22 Confirmed by Aletta Edouard [phone]) on 02/25/2020 11:42:17 AM   Radiology DG Chest Port 1 View  Result Date: 02/25/2020 CLINICAL DATA:   COVID-19.  Hypoxia. EXAM: PORTABLE CHEST 1 VIEW COMPARISON:  February 20, 2020 FINDINGS: The heart, hila, mediastinum are normal. No pneumothorax. Mild opacity in left base is stable, likely atelectasis. No suspicious focal infiltrates are identified on today's study. Decreased lung markings in the apices. Hyperexpansion of the lungs. IMPRESSION: 1. Emphysematous changes. 2. No acute infiltrate identified to suggest pneumonia. Electronically Signed   By: Gerome Sam III M.D   On: 02/25/2020 12:19      No hx of CAD, normal Lexiscan 02/16/19  EF 60-65 %     Procedures .Critical Care Performed by: Linwood Dibbles, PA-C Authorized by: Linwood Dibbles, PA-C   Critical care provider statement:    Critical care time (minutes):  35   Critical care was necessary to treat or prevent imminent or life-threatening deterioration of the following conditions:  Respiratory failure   Critical care was time spent personally by me on the following activities:  Discussions with consultants, evaluation of patient's response to treatment, examination of patient, ordering and performing treatments and interventions, ordering and review of laboratory studies, ordering and review of radiographic studies, pulse oximetry, re-evaluation of patient's condition, obtaining history from patient or surrogate and review of old charts     Medications Ordered in ED Medications  dexamethasone (DECADRON) injection 4 mg (has no administration in time range)  albuterol (VENTOLIN HFA) 108 (90 Base) MCG/ACT inhaler 2 puff (has no administration in time range)  sodium chloride 0.9 % bolus 1,000 mL (has no administration in time range)    ED Course  I have reviewed the triage vital signs and the nursing notes.  Pertinent labs & imaging results that were available during my care of the patient were reviewed by me and considered in my medical decision making (see chart for details).  87 year old presents for evaluation  of shortness of breath.  He is afebrile, nonseptic appearing however does appear ill.  Diagnosed with COVID 5 days ago here in ED.  Was sent home on steroids, he has taken these without difficulty.  Increased shortness of breath at home.  On arrival patient hypoxic into the mid, high 80s.  Subsequently placed on 2 L via nasal cannula.  Apparently was offered MAB infusion however per outpatient notes had difficulty getting in contact and scheduling this with the patient and subsequently did not receive MAB outpatient.  Heart clear.  Does have some mild coarse lung sounds.  Abdomen soft, nontender.  Does get significantly winded and short of breath with minimal movement, going from bed to standing.  Given acute, hypoxic respiratory failure likely due to Covid we will plan on labs, imaging.  Patient's prednisone, p.o. at 40 mg equivalent to 6 mg of Decadron.  Will give additional 4 mg Decadron here for a total of 10 equivalent of Decadron.  Labs and imaging personally reviewed and interpreted:  CBC without leukocytosis CMP mild hyponatremia 134, glucose 141, no additional electrolyte, renal or liver abnormality Lactic 2.8, suspect dehydration as patient has had decreased p.o. intake.  Low suspicion for sepsis. Will give IVF. D-dimer 1.02, similar to prior in past, low suspicion for PE as cause of SOB DG chest with emphysema however no infiltrates, cardiomegaly, pulmonary edema EKG without significant change  Patient reassessed. Discussed admission which patient is agreeable with.  Sx like related to acute hypoxic respiratory failure from Covid infection. Given steroids. Will plan admission to hospital for continued management.  CONSULT with Dr. Ronaldo Miyamoto with [facility] who will evaluate patient for admission.  Clinical Course as of 02/25/20 1547  Fri Feb 25, 7531  3218 87 year old male diagnosed with COVID about a week ago here with increased malaise shortness of breath.  No real pain.  Unfortunately  requiring oxygen here.  Will need admission for further management.  Patient agreeable to plan. [MB]    Clinical Course User Index [MB] Hayden Rasmussen, MD   MDM Rules/Calculators/A&P                          JAMILLE JUPITER was evaluated in Emergency Department on 02/25/2020 for the symptoms described in the history of present illness. He was evaluated in the context of the global COVID-19 pandemic, which necessitated consideration that the patient might be at risk for infection with the SARS-CoV-2 virus that causes COVID-19. Institutional protocols and algorithms that pertain to the evaluation of patients at risk for COVID-19 are in a state of rapid change based on information released by regulatory bodies including the CDC and federal and state organizations. These policies and algorithms were followed during the patient's care in the ED. Final Clinical Impression(s) / ED Diagnoses Final diagnoses:  COVID  Acute respiratory failure with hypoxia (HCC)  Elevated lactic acid level    Rx / DC Orders ED Discharge Orders    None       Henderly, Britni A, PA-C 02/25/20 1548    Hayden Rasmussen, MD 02/26/20 1135

## 2020-02-25 NOTE — H&P (Signed)
History and Physical    KAIZER DISSINGER FKC:[id number] DOB: [birth year] DOA: 02/25/2020  PCP: Darreld Mclean, MD  Patient coming from: Home  Chief Complaint: Increasing cough, fatigue.   HPI: Johnathan [name] is a 87 y.o. male with medical history significant of COPD, HLD, HTN. Presenting with fatigue, body aches, cough for the past week. He reports one sick contact. He tried his home COPD meds for help, but they did not. So after 3 days of symptoms, he went to the ED. He was diagnosed with COVID. At that time, he was not hypoxic. He was sent home with decadron and referred for MAB infusion. He did not follow up for infusion. His symptoms have worsened since leaving the ED. He has felt increasingly short of breath with no relief at home. He decided to come back to the ED. He denies any other aggravating or alleviating factors.   Pfizer vaccination w/ booster.  ED Course: Found to be hypoxic. Placed on steroids, remdes, O2. [facility] called for admission.   Review of Systems:  Denies CP, palpitations, N/V/D/F. Reports dry cough. Review of systems is otherwise negative for all not mentioned in HPI.   PMHx Past Medical History:  Diagnosis Date  . COPD (chronic obstructive pulmonary disease) ([place])   . Diverticulosis   . DM type 2 (diabetes mellitus, type 2) ([place])   . GERD (gastroesophageal reflux disease)    uses prilosec   . Hypertension   . Pneumonia 2018  . Raynaud's disease   . Shortness of breath dyspnea    WITH EXERTION  . Sinusitis   . Tremors of nervous system     PSHx Past Surgical History:  Procedure Laterality Date  . COLONOSCOPY  03/27/2005  . HERNIA REPAIR    . INGUINAL HERNIA REPAIR N/A 05/06/2019   Procedure: LAPAROSCOPIC RIGHT AND LEFT INGUINAL HERNIA(S) REPAIR;  Surgeon: Michael Boston, MD;  Location: [facility];  Service: General;  Laterality: N/A;    SocHx  reports that he quit smoking about 26 years ago. His smoking use included cigarettes. He has a  45.00 pack-year smoking history. He has never used smokeless tobacco. He reports current alcohol use. He reports that he does not use drugs.  No Known Allergies  FamHx Family History  Problem Relation Age of Onset  . Dementia Brother   . Diabetes Brother   . Heart attack Father   . Dementia Father     Prior to Admission medications   Medication Sig Start Date End Date Taking? Authorizing Provider  albuterol (PROVENTIL HFA;VENTOLIN HFA) 108 (90 Base) MCG/ACT inhaler Inhale 2 puffs into the lungs every 4 (four) hours as needed for wheezing or shortness of breath (or coughing). 7/49/44   Delora Fuel, MD  amLODipine (NORVASC) 5 MG tablet Take 1 tablet (5 mg total) by mouth daily. 02/01/20   Copland, Gay Filler, MD  aspirin EC 81 MG tablet Take 81 mg by mouth daily.    [provider]  benzonatate (TESSALON) 100 MG capsule Take 1 capsule (100 mg total) by mouth every 8 (eight) hours as needed for cough. 02/20/20   Loni Beckwith, PA-C  budesonide-formoterol (SYMBICORT) 160-4.5 MCG/ACT inhaler Inhale 2 puffs into the lungs 2 (two) times daily. 04/23/14   [name], MD  Cyanocobalamin (B-12) 1000 MCG CAPS Take 1 capsule by mouth daily.    [provider]  Flaxseed, Linseed, (FLAXSEED OIL) 1200 MG CAPS Take 1 capsule by mouth daily.    [provider]  Ginkgo Biloba EXTR Take 1 tablet by mouth daily.    [provider]  loratadine (CLARITIN) 10 MG tablet Take 1 tablet (10 mg total) by mouth daily. 02/01/20   Copland, Gay Filler, MD  meloxicam (MOBIC) 15 MG tablet Take 1 tablet (15 mg total) by mouth daily. Use as needed for joint pain 02/08/20   Copland, Gay Filler, MD  metFORMIN (GLUCOPHAGE) 500 MG tablet Take 1 tablet by mouth daily. 1/2 tab daily 02/13/17   [provider]  Misc Natural Products (HIMALAYAN GOJI PO) Take 1 tablet by mouth in the morning and at bedtime.     [provider]  Multiple Vitamin (MULTIVITAMIN WITH MINERALS) TABS  tablet Take 1 tablet by mouth daily.    [provider]  omeprazole (PRILOSEC) 20 MG capsule Take 1 capsule (20 mg total) by mouth 2 (two) times daily. 02/01/20   Copland, Gay Filler, MD  ondansetron (ZOFRAN) 4 MG tablet Take 1 tablet (4 mg total) by mouth every 8 (eight) hours as needed for nausea or vomiting. 02/20/20   Loni Beckwith, PA-C  pravastatin (PRAVACHOL) 40 MG tablet Take 0.5 tablets (20 mg total) by mouth daily. Takes 1/2 pill daily 02/01/20   Copland, Gay Filler, MD  predniSONE (DELTASONE) 20 MG tablet Take 2 tablets (40 mg total) by mouth daily with breakfast for 5 days. 02/20/20 02/25/20  Loni Beckwith, PA-C  primidone (MYSOLINE) 50 MG tablet Take 1 tablet (50 mg total) by mouth 2 (two) times daily. 02/01/20   Copland, Gay Filler, MD  sildenafil (REVATIO) 20 MG tablet Take 0.5 tablets (10 mg total) by mouth daily. Use for Raynaud's symptoms 02/01/20   Copland, Gay Filler, MD  terazosin (HYTRIN) 1 MG capsule Take 1 capsule (1 mg total) by mouth 2 (two) times daily. 02/01/20   Copland, Gay Filler, MD  Tetrahydrozoline HCl (VISINE OP) Apply 1 drop to eye daily.    [provider]    Physical Exam: Vitals:   02/25/20 1122 02/25/20 1210  BP: 140/65 94/84  Pulse: 88 85  Resp: 19 (!) 22  Temp: 98 F (36.7 C)   TempSrc: Oral   SpO2: (!) 88% 97%    General: 87 y.o. male resting in bed in NAD Eyes: PERRL, normal sclera ENMT: Nares patent w/o discharge, orophaynx clear, dentition normal, ears w/o discharge/lesions/ulcers Neck: Supple, trachea midline Cardiovascular: RRR, +S1, S2, no m/g/r, equal pulses throughout Respiratory: scattered rhonchi, inspiratory and expiratory wheeze, increased WOB on 2L [place]. GI: BS+, NDNT, no masses noted, no organomegaly noted MSK: No e/c/c Skin: No rashes, bruises, ulcerations noted Neuro: A&O x 3, no focal deficits Psyc: Appropriate interaction and affect, calm/cooperative  Labs on Admission: I have personally reviewed following  labs and imaging studies  CBC: Recent Labs  Lab 02/20/20 1241 02/25/20 1151  WBC 5.5 6.0  NEUTROABS 3.2 5.0  HGB 13.6 13.4  HCT 41.2 40.6  MCV 98.1 97.4  PLT 162 0000000   Basic Metabolic Panel: Recent Labs  Lab 02/20/20 1241 02/25/20 1151  NA 136 134*  K 4.3 4.2  CL 101 98  CO2 25 23  GLUCOSE 104* 141*  BUN 23 22  CREATININE 1.13 1.06  CALCIUM 9.1 8.9   GFR: CrCl cannot be calculated (Unknown ideal weight.). Liver Function Tests: Recent Labs  Lab 02/20/20 1241 02/25/20 1151  AST 23 30  ALT 18 25  ALKPHOS 70 65  BILITOT 0.6 0.4  PROT 7.5 7.3  ALBUMIN 4.1 3.6  No results for input(s): LIPASE, AMYLASE in the last 168 hours. No results for input(s): AMMONIA in the last 168 hours. Coagulation Profile: No results for input(s): INR, PROTIME in the last 168 hours. Cardiac Enzymes: No results for input(s): CKTOTAL, CKMB, CKMBINDEX, TROPONINI in the last 168 hours. BNP (last 3 results) No results for input(s): PROBNP in the last 8760 hours. HbA1C: No results for input(s): HGBA1C in the last 72 hours. CBG: No results for input(s): GLUCAP in the last 168 hours. Lipid Profile: Recent Labs    02/25/20 1151  TRIG 89   Thyroid Function Tests: No results for input(s): TSH, T4TOTAL, FREET4, T3FREE, THYROIDAB in the last 72 hours. Anemia Panel: Recent Labs    02/25/20 1151  FERRITIN 63   Urine analysis:    Component Value Date/Time   COLORURINE YELLOW 05/16/2017 0605   APPEARANCEUR CLEAR 05/16/2017 0605   LABSPEC 1.024 05/16/2017 0605   PHURINE 6.0 05/16/2017 0605   GLUCOSEU NEGATIVE 05/16/2017 0605   HGBUR MODERATE (A) 05/16/2017 0605   BILIRUBINUR NEGATIVE 05/16/2017 0605   KETONESUR 5 (A) 05/16/2017 0605   PROTEINUR 100 (A) 05/16/2017 0605   UROBILINOGEN 1.0 04/21/2014 1425   NITRITE NEGATIVE 05/16/2017 0605   LEUKOCYTESUR NEGATIVE 05/16/2017 0605    Radiological Exams on Admission: DG Chest Port 1 View  Result Date: 02/25/2020 CLINICAL DATA:   COVID-19.  Hypoxia. EXAM: PORTABLE CHEST 1 VIEW COMPARISON:  February 20, 2020 FINDINGS: The heart, hila, mediastinum are normal. No pneumothorax. Mild opacity in left base is stable, likely atelectasis. No suspicious focal infiltrates are identified on today's study. Decreased lung markings in the apices. Hyperexpansion of the lungs. IMPRESSION: 1. Emphysematous changes. 2. No acute infiltrate identified to suggest pneumonia. Electronically Signed   By: Dorise Bullion III M.D   On: 02/25/2020 12:19   Assessment/Plan COVID 19 infection COPD exacerbation Acute hypoxic respiratory failure secondary to above     - admit to inpt, tele     - sats 88% on RA, currently require 2L [place] to maintain appropriate sats     - continue remdes, steroids; add combivent, IS, FV     - trend inflammatory markers     - mixed picture of both COVID and COPD; treatment will cover both  HTN     - resume home regimen  Lactic acidosis     - fluids, monitor  Pre-diabetes?     - check A1c     - carb modified diet  HLD     - pravastatin  Hx of Raynaud's     - was on sildenafil, but no longer taking; follow  DVT prophylaxis: lovenox  Code Status: FULL  Family Communication: None at bedside  Consults called: None  Status is: Inpatient  Remains inpatient appropriate because:Inpatient level of care appropriate due to severity of illness   Dispo: The patient is from: Home              Anticipated d/c is to: Home              Anticipated d/c date is: 3 days              Patient currently is not medically stable to d/c.   Difficult to place patient No  Jonnie Finner DO [facility]  If 7PM-7AM, please contact night-coverage [email or web address]  02/25/2020, 1:19 PM

## 2020-02-25 NOTE — ED Triage Notes (Signed)
Pt presents with c/o Covid positive. Pt reports he tested positive on Sunday and is starting to feel worse. Pt is 88% on RA, coughing in triage, no acute distress but pt does have some shortness of breath with the coughing.

## 2020-02-26 DIAGNOSIS — R7989 Other specified abnormal findings of blood chemistry: Secondary | ICD-10-CM | POA: Diagnosis not present

## 2020-02-26 DIAGNOSIS — U071 COVID-19: Secondary | ICD-10-CM | POA: Diagnosis not present

## 2020-02-26 DIAGNOSIS — J9601 Acute respiratory failure with hypoxia: Secondary | ICD-10-CM | POA: Diagnosis not present

## 2020-02-26 LAB — CBC WITH DIFFERENTIAL/PLATELET
Abs Immature Granulocytes: 0.02 10*3/uL (ref 0.00–0.07)
Basophils Absolute: 0 10*3/uL (ref 0.0–0.1)
Basophils Relative: 0 %
Eosinophils Absolute: 0 10*3/uL (ref 0.0–0.5)
Eosinophils Relative: 0 %
HCT: 38.9 % — ABNORMAL LOW (ref 39.0–52.0)
Hemoglobin: 12.8 g/dL — ABNORMAL LOW (ref 13.0–17.0)
Immature Granulocytes: 0 %
Lymphocytes Relative: 26 %
Lymphs Abs: 1.3 10*3/uL (ref 0.7–4.0)
MCH: 32.4 pg (ref 26.0–34.0)
MCHC: 32.9 g/dL (ref 30.0–36.0)
MCV: 98.5 fL (ref 80.0–100.0)
Monocytes Absolute: 0.5 10*3/uL (ref 0.1–1.0)
Monocytes Relative: 10 %
Neutro Abs: 3 10*3/uL (ref 1.7–7.7)
Neutrophils Relative %: 64 %
Platelets: 136 10*3/uL — ABNORMAL LOW (ref 150–400)
RBC: 3.95 MIL/uL — ABNORMAL LOW (ref 4.22–5.81)
RDW: 13.2 % (ref 11.5–15.5)
WBC: 4.8 10*3/uL (ref 4.0–10.5)
nRBC: 0 % (ref 0.0–0.2)

## 2020-02-26 LAB — COMPREHENSIVE METABOLIC PANEL
ALT: 23 U/L (ref 0–44)
AST: 27 U/L (ref 15–41)
Albumin: 3.3 g/dL — ABNORMAL LOW (ref 3.5–5.0)
Alkaline Phosphatase: 54 U/L (ref 38–126)
Anion gap: 7 (ref 5–15)
BUN: 19 mg/dL (ref 8–23)
CO2: 25 mmol/L (ref 22–32)
Calcium: 8.6 mg/dL — ABNORMAL LOW (ref 8.9–10.3)
Chloride: 103 mmol/L (ref 98–111)
Creatinine, Ser: 0.87 mg/dL (ref 0.61–1.24)
GFR, Estimated: >60 mL/min (ref >60)
Glucose, Bld: 108 mg/dL — ABNORMAL HIGH (ref 70–99)
Potassium: 3.9 mmol/L (ref 3.5–5.1)
Sodium: 135 mmol/L (ref 135–145)
Total Bilirubin: 0.3 mg/dL (ref 0.3–1.2)
Total Protein: 6.8 g/dL (ref 6.5–8.1)

## 2020-02-26 LAB — FERRITIN: Ferritin: 66 ng/mL (ref 24–336)

## 2020-02-26 LAB — D-DIMER, QUANTITATIVE: D-Dimer, Quant: 0.81 ug/mL-FEU — ABNORMAL HIGH (ref 0.00–0.50)

## 2020-02-26 LAB — PHOSPHORUS: Phosphorus: 3.6 mg/dL (ref 2.5–4.6)

## 2020-02-26 LAB — MAGNESIUM: Magnesium: 2 mg/dL (ref 1.7–2.4)

## 2020-02-26 LAB — C-REACTIVE PROTEIN: CRP: 7.5 mg/dL — ABNORMAL HIGH (ref <1.0)

## 2020-02-26 MED ORDER — ALBUTEROL SULFATE (2.5 MG/3ML) 0.083% IN NEBU
INHALATION_SOLUTION | RESPIRATORY_TRACT | Status: AC
  Filled 2020-02-26: qty 3

## 2020-02-26 MED ORDER — IPRATROPIUM-ALBUTEROL 0.5-2.5 (3) MG/3ML IN SOLN
RESPIRATORY_TRACT | Status: AC
  Filled 2020-02-26: qty 3

## 2020-02-26 MED ORDER — GUAIFENESIN-DM 100-10 MG/5ML PO SYRP
10.0000 mL | ORAL_SOLUTION | Freq: Four times a day (QID) | ORAL | Status: DC
  Administered 2020-02-26 – 2020-02-29 (×13): 10 mL via ORAL
  Filled 2020-02-26 (×13): qty 10

## 2020-02-26 MED ORDER — HYDROCOD POLST-CPM POLST ER 10-8 MG/5ML PO SUER
5.0000 mL | Freq: Two times a day (BID) | ORAL | Status: DC
Start: 2020-02-26 — End: 2020-02-29
  Administered 2020-02-26 – 2020-02-29 (×6): 5 mL via ORAL
  Filled 2020-02-26 (×6): qty 5

## 2020-02-26 NOTE — Progress Notes (Signed)
PROGRESS NOTE    Patient: Johnathan [name]                            PCP: Darreld Mclean, MD                    DOB: 1933-01-15            DOA: 02/25/2020 [id number]:[id number]             DOS: 02/26/2020, 9:44 AM   LOS: 1 day   Date of Service: The patient was seen and examined on 02/26/2020  Subjective:   The patient was seen and examined this morning. Stable at this time. Complaining of shortness of breath cough denies any chest pain Otherwise no issues overnight . Satting 95% on 2 L of oxygen    Brief Narrative:   Johnathan [name] is a 87 y.o. male with medical history significant of COPD, HLD, HTN. Presenting with fatigue, body aches, cough for the past week. He reports one sick contact. He tried his home COPD meds for help, but they did not. So after 3 days of symptoms, he went to the ED. He was diagnosed with COVID. At that time, he was not hypoxic. He was sent home with decadron and referred for MAB infusion. He did not follow up for infusion. His symptoms have worsened since leaving the ED. He has felt increasingly short of breath with no relief at home. He decided to come back to the ED. He denies any other aggravating or alleviating factors.   Pfizer vaccination w/ booster.  ED Course: Found to be hypoxic. Placed on steroids, remdes, O2. TRH called for admission      Assessment & Plan:   Active Problems:   COVID-19   COVID 19 infection -acute respiratory failure due to Covid infection, complicated by COPD, COPD exacerbation  -Remains hypoxic on room air, will continue supplemental oxygen currently on 2 L of oxygen, satting 95%     - sats 88% on RA, currently require 2L [place] to maintain appropriate sats     -Continue remdesivir, IV steroids, Combivent,IS, FV, mucolytic's     -We will trend inflammatory markers     -Patient appears to be hypoxic in respiratory failure due to Covid infection superimposed by COPD, COPD exacerbation, continue current management  HTN  -essential hypertension     -Continue home medication of amlodipine    Lactic acidosis     -Likely due to hypoxia,(lactic acid 2.8, 1.9, procalcitonin<0.10)      -Sepsis ruled out at this time (     -Monitoring closely, gentle IV fluid hydration  Pre-diabetes     - check A1c 6.5     - carb modified diet  HLD -hyperlipidemia     - pravastatin  Hx of Raynaud's     - was on sildenafil, but no longer taking; followed patient        ----------------------------------------------------------------------------------------------------------------------------------------------- Nutritional status:  The patient's BMI is: Body mass index is 21.86 kg/m. I agree with the assessment and plan as outlined below: ----------------------------------------------------------------------------------------------------------------------------------- Cultures; Blood Cultures x 2   Antimicrobials:    Consultants: None ------------------------------------------------------------------------------------------------------------------------------------  DVT prophylaxis:  SCD/Compression stockings and Lovenox SQ Code Status:   Code Status: Full Code  Family Communication: No family member present at bedside- The above findings and plan of care has been discussed with patient  in detail,  they expressed understanding and agreement of  above. -Advance care planning has been discussed.   Admission status:   Status is: Inpatient  Remains inpatient appropriate because:Inpatient level of care appropriate due to severity of illness   Dispo: The patient is from: Home              Anticipated d/c is to: Home              Anticipated d/c date is: > 3 days              Patient currently is not medically stable to d/c.   Difficult to place patient No      Level of care: Telemetry   Procedures:   No admission procedures for hospital encounter.     Antimicrobials:  Anti-infectives  (From admission, onward)   Start     Dose/Rate Route Frequency Ordered Stop   02/26/20 1000  remdesivir 100 mg in sodium chloride 0.9 % 100 mL IVPB  Status:  Discontinued       "Followed by" Linked Group Details   100 mg 200 mL/hr over 30 Minutes Intravenous Daily 02/25/20 2039 02/25/20 2045   02/26/20 1000  remdesivir 100 mg in sodium chloride 0.9 % 100 mL IVPB        100 mg 200 mL/hr over 30 Minutes Intravenous Daily 02/25/20 1409 03/01/20 0959   02/25/20 1530  remdesivir 200 mg in sodium chloride 0.9% 250 mL IVPB        200 mg 580 mL/hr over 30 Minutes Intravenous Once 02/25/20 1409 02/25/20 1505   02/25/20 1415  remdesivir 200 mg in sodium chloride 0.9% 250 mL IVPB  Status:  Discontinued       "Followed by" Linked Group Details   200 mg 580 mL/hr over 30 Minutes Intravenous Once 02/25/20 2039 02/25/20 2045       Medication:  . albuterol      . amLODipine  5 mg Oral Daily  . vitamin C  500 mg Oral Daily  . aspirin EC  81 mg Oral Daily  . chlorpheniramine-HYDROcodone  5 mL Oral Q12H  . enoxaparin (LOVENOX) injection  40 mg Subcutaneous Q24H  . guaiFENesin-dextromethorphan  10 mL Oral Q6H  . Ipratropium-Albuterol  1 puff Inhalation Q6H  . ipratropium-albuterol      . loratadine  10 mg Oral Daily  . methylPREDNISolone (SOLU-MEDROL) injection  0.5 mg/kg Intravenous Q12H   Followed by  . [START ON 02/29/2020] predniSONE  50 mg Oral Daily  . multivitamin with minerals  1 tablet Oral Daily  . pravastatin  20 mg Oral Daily  . primidone  50 mg Oral BID  . terazosin  1 mg Oral BID  . tetrahydrozoline  1 drop Both Eyes BID  . vitamin B-12  500 mcg Oral Daily  . zinc sulfate  220 mg Oral Daily    meloxicam, ondansetron   Objective:   Vitals:   02/25/20 2100 02/26/20 0111 02/26/20 0430 02/26/20 0846  BP:  (!) 144/65 (!) 156/81 (!) 155/78  Pulse:  72 68 68  Resp:  16 16 18   Temp:  97.8 F (36.6 C) 97.6 F (36.4 C) 98.1 F (36.7 C)  TempSrc:  Oral Oral Oral  SpO2:  94% 95%  95%  Weight: 69.1 kg     Height: 5\' 10"  (1.778 m)       Intake/Output Summary (Last 24 hours) at 02/26/2020 0944 Last data filed at 02/26/2020 0848 Gross per 24 hour  Intake 2165.8 ml  Output 400 ml  Net 1765.8 ml   Filed Weights   02/25/20 2100  Weight: 69.1 kg     Examination:   Physical Exam  Constitution:  Alert, cooperative, no distress,  Appears calm and comfortable  Psychiatric: Normal and stable mood and affect, cognition intact,   HEENT: Normocephalic, PERRL, otherwise with in Normal limits  Chest:Chest symmetric Cardio vascular:  S1/S2, RRR, No murmure, No Rubs or Gallops  pulmonary: Diffuse rhonchi, Rales, respirations mildly labored, negative wheezes / crackles Abdomen: Soft, non-tender, non-distended, bowel sounds,no masses, no organomegaly Muscular skeletal: Limited exam - in bed, able to move all 4 extremities, Normal strength,  Neuro: CNII-XII intact. , normal motor and sensation, reflexes intact  Extremities: No pitting edema lower extremities, +2 pulses  Skin: Dry, warm to touch, negative for any Rashes, No open wounds Wounds: per nursing documentation    ------------------------------------------------------------------------------------------------------------------------------------------    LABs:  CBC Latest Ref Rng & Units 02/26/2020 02/25/2020 02/25/2020  WBC 4.0 - 10.5 K/uL 4.8 4.1 6.0  Hemoglobin 13.0 - 17.0 g/dL 12.8(L) 12.7(L) 13.4  Hematocrit 39.0 - 52.0 % 38.9(L) 38.4(L) 40.6  Platelets 150 - 400 K/uL 136(L) 177 193   CMP Latest Ref Rng & Units 02/26/2020 02/25/2020 02/25/2020  Glucose 70 - 99 mg/dL 108(H) - 141(H)  BUN 8 - 23 mg/dL 19 - 22  Creatinine 0.61 - 1.24 mg/dL 0.87 0.86 1.06  Sodium 135 - 145 mmol/L 135 - 134(L)  Potassium 3.5 - 5.1 mmol/L 3.9 - 4.2  Chloride 98 - 111 mmol/L 103 - 98  CO2 22 - 32 mmol/L 25 - 23  Calcium 8.9 - 10.3 mg/dL 8.6(L) - 8.9  Total Protein 6.5 - 8.1 g/dL 6.8 - 7.3  Total Bilirubin 0.3 - 1.2 mg/dL 0.3 - 0.4   Alkaline Phos 38 - 126 U/L 54 - 65  AST 15 - 41 U/L 27 - 30  ALT 0 - 44 U/L 23 - 25       Micro Results No results found for this or any previous visit (from the past 240 hour(s)).  Radiology Reports DG Chest 2 View  Result Date: 02/20/2020 CLINICAL DATA:  Productive cough with shortness of breath for 2 days. History of COPD. EXAM: CHEST - 2 VIEW COMPARISON:  Chest x-ray dated 02/04/2019. FINDINGS: Heart size and mediastinal contours are stable. Atherosclerosis at the aortic arch. Lungs are hyperexpanded. Lungs are clear. No pleural effusion or pneumothorax is seen. No acute appearing osseous abnormality. IMPRESSION: 1. No active cardiopulmonary disease. No evidence of pneumonia or pulmonary edema. 2. COPD. 3. Aortic atherosclerosis. Electronically Signed   By: Franki Cabot M.D.   On: 02/20/2020 12:48   DG Chest Port 1 View  Result Date: 02/25/2020 CLINICAL DATA:  COVID-19.  Hypoxia. EXAM: PORTABLE CHEST 1 VIEW COMPARISON:  February 20, 2020 FINDINGS: The heart, hila, mediastinum are normal. No pneumothorax. Mild opacity in left base is stable, likely atelectasis. No suspicious focal infiltrates are identified on today's study. Decreased lung markings in the apices. Hyperexpansion of the lungs. IMPRESSION: 1. Emphysematous changes. 2. No acute infiltrate identified to suggest pneumonia. Electronically Signed   By: Dorise Bullion III M.D   On: 02/25/2020 12:19    SIGNED: Deatra James, MD, FHM. [facility],  Pager (please use [email or web address] to page/text) Please use Epic Secure Chat for non-urgent communication (7AM-7PM)  If 7PM-7AM, please contact night-coverage [email or web address], 02/26/2020, 9:44 AM

## 2020-02-26 NOTE — Plan of Care (Signed)
  Problem: Education: Goal: Knowledge of risk factors and measures for prevention of condition will improve Outcome: Progressing   Problem: Coping: Goal: Psychosocial and spiritual needs will be supported Outcome: Progressing   Problem: Respiratory: Goal: Will maintain a patent airway Outcome: Progressing Goal: Complications related to the disease process, condition or treatment will be avoided or minimized Outcome: Progressing   

## 2020-02-27 DIAGNOSIS — U071 COVID-19: Secondary | ICD-10-CM | POA: Diagnosis not present

## 2020-02-27 DIAGNOSIS — R7989 Other specified abnormal findings of blood chemistry: Secondary | ICD-10-CM | POA: Diagnosis not present

## 2020-02-27 DIAGNOSIS — J9601 Acute respiratory failure with hypoxia: Secondary | ICD-10-CM | POA: Diagnosis not present

## 2020-02-27 LAB — CBC WITH DIFFERENTIAL/PLATELET
Abs Immature Granulocytes: 0.03 10*3/uL (ref 0.00–0.07)
Basophils Absolute: 0 10*3/uL (ref 0.0–0.1)
Basophils Relative: 0 %
Eosinophils Absolute: 0 10*3/uL (ref 0.0–0.5)
Eosinophils Relative: 0 %
HCT: 37.4 % — ABNORMAL LOW (ref 39.0–52.0)
Hemoglobin: 12.5 g/dL — ABNORMAL LOW (ref 13.0–17.0)
Immature Granulocytes: 1 %
Lymphocytes Relative: 18 %
Lymphs Abs: 0.9 10*3/uL (ref 0.7–4.0)
MCH: 32.7 pg (ref 26.0–34.0)
MCHC: 33.4 g/dL (ref 30.0–36.0)
MCV: 97.9 fL (ref 80.0–100.0)
Monocytes Absolute: 0.3 10*3/uL (ref 0.1–1.0)
Monocytes Relative: 6 %
Neutro Abs: 3.6 10*3/uL (ref 1.7–7.7)
Neutrophils Relative %: 75 %
Platelets: 172 10*3/uL (ref 150–400)
RBC: 3.82 MIL/uL — ABNORMAL LOW (ref 4.22–5.81)
RDW: 13.3 % (ref 11.5–15.5)
WBC: 4.7 10*3/uL (ref 4.0–10.5)
nRBC: 0.4 % — ABNORMAL HIGH (ref 0.0–0.2)

## 2020-02-27 LAB — COMPREHENSIVE METABOLIC PANEL
ALT: 24 U/L (ref 0–44)
AST: 31 U/L (ref 15–41)
Albumin: 3.1 g/dL — ABNORMAL LOW (ref 3.5–5.0)
Alkaline Phosphatase: 56 U/L (ref 38–126)
Anion gap: 11 (ref 5–15)
BUN: 20 mg/dL (ref 8–23)
CO2: 26 mmol/L (ref 22–32)
Calcium: 8.6 mg/dL — ABNORMAL LOW (ref 8.9–10.3)
Chloride: 101 mmol/L (ref 98–111)
Creatinine, Ser: 0.77 mg/dL (ref 0.61–1.24)
GFR, Estimated: >60 mL/min (ref >60)
Glucose, Bld: 140 mg/dL — ABNORMAL HIGH (ref 70–99)
Potassium: 4.3 mmol/L (ref 3.5–5.1)
Sodium: 138 mmol/L (ref 135–145)
Total Bilirubin: 0.5 mg/dL (ref 0.3–1.2)
Total Protein: 6.4 g/dL — ABNORMAL LOW (ref 6.5–8.1)

## 2020-02-27 LAB — D-DIMER, QUANTITATIVE: D-Dimer, Quant: 0.49 ug/mL-FEU (ref 0.00–0.50)

## 2020-02-27 LAB — MAGNESIUM: Magnesium: 1.8 mg/dL (ref 1.7–2.4)

## 2020-02-27 LAB — C-REACTIVE PROTEIN: CRP: 7.3 mg/dL — ABNORMAL HIGH (ref <1.0)

## 2020-02-27 LAB — FERRITIN: Ferritin: 64 ng/mL (ref 24–336)

## 2020-02-27 LAB — PHOSPHORUS: Phosphorus: 3.2 mg/dL (ref 2.5–4.6)

## 2020-02-27 NOTE — Progress Notes (Signed)
[facility]  PROGRESS NOTE    Patient: Johnathan [name]                            PCP: Darreld Mclean, MD                    DOB: January 09, 1933            DOA: 02/25/2020 [id number]             DOS: 02/27/2020, 11:00 AM   LOS: 2 days   Date of Service: The patient was seen and examined on 02/27/2020  Subjective:   The patient was seen and examined this morning, sitting up in bed no acute distress. Continues to complain of shortness of breath and cough especially with exertion. Stating cough is spontaneous and bothersome Remains on 2 L of oxygen satting 96%     Brief Narrative:   KOU BELLANTI is a 87 y.o. male with medical history significant of COPD, HLD, HTN. Presenting with fatigue, body aches, cough for the past week. He reports one sick contact. He tried his home COPD meds for help, but they did not. So after 3 days of symptoms, he went to the ED. He was diagnosed with COVID. At that time, he was not hypoxic. He was sent home with decadron and referred for MAB infusion. He did not follow up for infusion. His symptoms have worsened since leaving the ED. He has felt increasingly short of breath with no relief at home. He decided to come back to the ED. He denies any other aggravating or alleviating factors.   Pfizer vaccination w/ booster.  ED Course: Found to be hypoxic. Placed on steroids, remdes, O2. TRH called for admission      Assessment & Plan:   Active Problems:   COVID-19   COVID 19 infection -acute respiratory failure due to Covid infection, complicated by COPD, COPD exacerbation  -Continues to be on 2 L of oxygen, satting 96% continues to complain of shortness of breath and cough     - sats 88% on RA, currently require 2L [place] to maintain appropriate sats     -We will continue current treatment of remdesivir, IV steroids, Combivent,IS, FV, mucolytic's     -Trending inflammatory markers, improving     -Patient appears to be hypoxic in respiratory failure due to Covid  infection superimposed by COPD, COPD exacerbation, continue current management  HTN -essential hypertension     -Continue home medication of amlodipine -Blood pressure remained stable    Lactic acidosis     -Likely due to hypoxia,(lactic acid 2.8, 1.9, procalcitonin<0.10)      -Sepsis ruled out at this time       -Much improved symptoms     -Monitoring closely, gentle IV fluid hydration... We will discontinue IV fluid  Pre-diabetes     - check A1c 6.5     - carb modified diet  HLD -hyperlipidemia     -Continue pravastatin  Hx of Raynaud's     - was on sildenafil, but no longer taking; followed patient        ----------------------------------------------------------------------------------------------------------------------------------------------- Nutritional status:  The patient's BMI is: Body mass index is 21.86 kg/m. I agree with the assessment and plan as outlined below: ----------------------------------------------------------------------------------------------------------------------------------- Cultures; Blood Cultures x 2   Antimicrobials:    Consultants: None ------------------------------------------------------------------------------------------------------------------------------------  DVT prophylaxis:  SCD/Compression stockings and Lovenox SQ Code Status:   Code Status:  Full Code  Family Communication: No family member present at bedside- The above findings and plan of care has been discussed with patient  in detail,  they expressed understanding and agreement of above. -Advance care planning has been discussed.   Admission status:   Status is: Inpatient  Remains inpatient appropriate because:Inpatient level of care appropriate due to severity of illness   Dispo: The patient is from: Home              Anticipated d/c is to: Home              Anticipated d/c date is: > 3 days              Patient currently is not medically stable  to d/c.   Difficult to place patient No      Level of care: Telemetry   Procedures:   No admission procedures for hospital encounter.     Antimicrobials:  Anti-infectives (From admission, onward)   Start     Dose/Rate Route Frequency Ordered Stop   02/26/20 1000  remdesivir 100 mg in sodium chloride 0.9 % 100 mL IVPB  Status:  Discontinued       "Followed by" Linked Group Details   100 mg 200 mL/hr over 30 Minutes Intravenous Daily 02/25/20 2039 02/25/20 2045   02/26/20 1000  remdesivir 100 mg in sodium chloride 0.9 % 100 mL IVPB        100 mg 200 mL/hr over 30 Minutes Intravenous Daily 02/25/20 1409 03/01/20 0959   02/25/20 1530  remdesivir 200 mg in sodium chloride 0.9% 250 mL IVPB        200 mg 580 mL/hr over 30 Minutes Intravenous Once 02/25/20 1409 02/25/20 1505   02/25/20 1415  remdesivir 200 mg in sodium chloride 0.9% 250 mL IVPB  Status:  Discontinued       "Followed by" Linked Group Details   200 mg 580 mL/hr over 30 Minutes Intravenous Once 02/25/20 2039 02/25/20 2045       Medication:  . amLODipine  5 mg Oral Daily  . vitamin C  500 mg Oral Daily  . aspirin EC  81 mg Oral Daily  . chlorpheniramine-HYDROcodone  5 mL Oral Q12H  . enoxaparin (LOVENOX) injection  40 mg Subcutaneous Q24H  . guaiFENesin-dextromethorphan  10 mL Oral Q6H  . Ipratropium-Albuterol  1 puff Inhalation Q6H  . loratadine  10 mg Oral Daily  . methylPREDNISolone (SOLU-MEDROL) injection  0.5 mg/kg Intravenous Q12H   Followed by  . [START ON 02/29/2020] predniSONE  50 mg Oral Daily  . multivitamin with minerals  1 tablet Oral Daily  . pravastatin  20 mg Oral Daily  . primidone  50 mg Oral BID  . terazosin  1 mg Oral BID  . tetrahydrozoline  1 drop Both Eyes BID  . vitamin B-12  500 mcg Oral Daily  . zinc sulfate  220 mg Oral Daily    meloxicam, ondansetron   Objective:   Vitals:   02/26/20 0846 02/26/20 1401 02/26/20 2015 02/27/20 0421  BP: (!) 155/78 128/64 (!) 147/86 135/64   Pulse: 68 87 66 66  Resp: 18 14 16 16   Temp: 98.1 F (36.7 C) 98.1 F (36.7 C) 97.8 F (36.6 C) 98.2 F (36.8 C)  TempSrc: Oral Oral Oral Oral  SpO2: 95% 94% 96% 96%  Weight:      Height:        Intake/Output Summary (Last 24 hours) at 02/27/2020 1100 Last data  filed at 02/27/2020 0858 Gross per 24 hour  Intake 2270.15 ml  Output 2350 ml  Net -79.85 ml   Filed Weights   02/25/20 2100  Weight: 69.1 kg     Examination:      Physical Exam:   General:  Alert, oriented, cooperative, no distress;   HEENT:  Normocephalic, PERRL, otherwise with in Normal limits   Neuro:  CNII-XII intact. , normal motor and sensation, reflexes intact   Lungs:    Diffuse Rales, rhonchi, Respirations mildly labored, no wheezes / crackles  Cardio:    S1/S2, RRR, No murmure, No Rubs or Gallops   Abdomen:   Soft, non-tender, bowel sounds active all four quadrants,  no guarding or peritoneal signs.  Muscular skeletal:  Limited exam - in bed, able to move all 4 extremities, Normal strength,  2+ pulses,  symmetric, No pitting edema  Skin:  Dry, warm to touch, negative for any Rashes,  Wounds: Please see nursing documentation       LABs:  CBC Latest Ref Rng & Units 02/27/2020 02/26/2020 02/25/2020  WBC 4.0 - 10.5 K/uL 4.7 4.8 4.1  Hemoglobin 13.0 - 17.0 g/dL 12.5(L) 12.8(L) 12.7(L)  Hematocrit 39.0 - 52.0 % 37.4(L) 38.9(L) 38.4(L)  Platelets 150 - 400 K/uL 172 136(L) 177   CMP Latest Ref Rng & Units 02/27/2020 02/26/2020 02/25/2020  Glucose 70 - 99 mg/dL 140(H) 108(H) -  BUN 8 - 23 mg/dL 20 19 -  Creatinine 0.61 - 1.24 mg/dL 0.77 0.87 0.86  Sodium 135 - 145 mmol/L 138 135 -  Potassium 3.5 - 5.1 mmol/L 4.3 3.9 -  Chloride 98 - 111 mmol/L 101 103 -  CO2 22 - 32 mmol/L 26 25 -  Calcium 8.9 - 10.3 mg/dL 8.6(L) 8.6(L) -  Total Protein 6.5 - 8.1 g/dL 6.4(L) 6.8 -  Total Bilirubin 0.3 - 1.2 mg/dL 0.5 0.3 -  Alkaline Phos 38 - 126 U/L 56 54 -  AST 15 - 41 U/L 31 27 -  ALT 0 - 44 U/L 24 23 -        Micro Results Recent Results (from the past 240 hour(s))  Blood Culture (routine x 2)     Status: None (Preliminary result)   Collection Time: 02/25/20 11:35 AM   Specimen: BLOOD  Result Value Ref Range Status   Specimen Description   Final    BLOOD BLOOD LEFT FOREARM Performed at [facility] [address] [place], [place] [address]    Special Requests   Final    BOTTLES DRAWN AEROBIC AND ANAEROBIC Blood Culture adequate volume Performed at [facility] [address] [place], [place] [address]    Culture   Final    NO GROWTH 1 DAY Performed at [facility], [place] [address], [place], [place] [address]    Report Status PENDING  Incomplete  Blood Culture (routine x 2)     Status: None (Preliminary result)   Collection Time: 02/25/20 11:50 AM   Specimen: BLOOD  Result Value Ref Range Status   Specimen Description   Final    BLOOD RIGHT ANTECUBITAL Performed at [facility] [address] [place], [place] [address]    Special Requests   Final    BOTTLES DRAWN AEROBIC AND ANAEROBIC Blood Culture adequate volume Performed at [facility] [address] [place], [place] [address]    Culture   Final    NO GROWTH 1 DAY Performed at [facility], [place] [address], [place], [place] [address]    Report Status PENDING  Incomplete    Radiology Reports DG Chest 2 View  Result Date: 02/20/2020 CLINICAL DATA:  Productive cough with shortness of breath for 2 days. History of COPD. EXAM: CHEST - 2 VIEW COMPARISON:  Chest x-ray dated 02/04/2019. FINDINGS: Heart size and mediastinal contours are stable. Atherosclerosis at the aortic arch. Lungs are hyperexpanded. Lungs are clear. No pleural effusion or pneumothorax is seen. No acute appearing osseous abnormality. IMPRESSION: 1. No active cardiopulmonary disease. No evidence of pneumonia or pulmonary edema. 2. COPD. 3. Aortic atherosclerosis.  Electronically Signed   By: Franki Cabot M.D.   On: 02/20/2020 12:48   DG Chest Port 1 View  Result Date: 02/25/2020 CLINICAL DATA:  COVID-19.  Hypoxia. EXAM: PORTABLE CHEST 1 VIEW COMPARISON:  February 20, 2020 FINDINGS: The heart, hila, mediastinum are normal. No pneumothorax. Mild opacity in left base is stable, likely atelectasis. No suspicious focal infiltrates are identified on today's study. Decreased lung markings in the apices. Hyperexpansion of the lungs. IMPRESSION: 1. Emphysematous changes. 2. No acute infiltrate identified to suggest pneumonia. Electronically Signed   By: Dorise Bullion III M.D   On: 02/25/2020 12:19    SIGNED: Deatra James, MD, FHM. [facility],  Pager (please use [email or web address] to page/text) Please use Epic Secure Chat for non-urgent communication (7AM-7PM)  If 7PM-7AM, please contact night-coverage [email or web address], 02/27/2020, 11:00 AM

## 2020-02-28 DIAGNOSIS — U071 COVID-19: Secondary | ICD-10-CM | POA: Diagnosis not present

## 2020-02-28 LAB — COMPREHENSIVE METABOLIC PANEL
ALT: 27 U/L (ref 0–44)
AST: 28 U/L (ref 15–41)
Albumin: 3 g/dL — ABNORMAL LOW (ref 3.5–5.0)
Alkaline Phosphatase: 55 U/L (ref 38–126)
Anion gap: 9 (ref 5–15)
BUN: 20 mg/dL (ref 8–23)
CO2: 29 mmol/L (ref 22–32)
Calcium: 8.8 mg/dL — ABNORMAL LOW (ref 8.9–10.3)
Chloride: 98 mmol/L (ref 98–111)
Creatinine, Ser: 0.85 mg/dL (ref 0.61–1.24)
GFR, Estimated: >60 mL/min (ref >60)
Glucose, Bld: 164 mg/dL — ABNORMAL HIGH (ref 70–99)
Potassium: 4.4 mmol/L (ref 3.5–5.1)
Sodium: 136 mmol/L (ref 135–145)
Total Bilirubin: 0.6 mg/dL (ref 0.3–1.2)
Total Protein: 6.5 g/dL (ref 6.5–8.1)

## 2020-02-28 LAB — CBC WITH DIFFERENTIAL/PLATELET
Abs Immature Granulocytes: 0.04 10*3/uL (ref 0.00–0.07)
Basophils Absolute: 0 10*3/uL (ref 0.0–0.1)
Basophils Relative: 0 %
Eosinophils Absolute: 0 10*3/uL (ref 0.0–0.5)
Eosinophils Relative: 0 %
HCT: 38 % — ABNORMAL LOW (ref 39.0–52.0)
Hemoglobin: 12.7 g/dL — ABNORMAL LOW (ref 13.0–17.0)
Immature Granulocytes: 1 %
Lymphocytes Relative: 13 %
Lymphs Abs: 0.8 10*3/uL (ref 0.7–4.0)
MCH: 32.6 pg (ref 26.0–34.0)
MCHC: 33.4 g/dL (ref 30.0–36.0)
MCV: 97.4 fL (ref 80.0–100.0)
Monocytes Absolute: 0.4 10*3/uL (ref 0.1–1.0)
Monocytes Relative: 6 %
Neutro Abs: 4.9 10*3/uL (ref 1.7–7.7)
Neutrophils Relative %: 80 %
Platelets: 179 10*3/uL (ref 150–400)
RBC: 3.9 MIL/uL — ABNORMAL LOW (ref 4.22–5.81)
RDW: 13 % (ref 11.5–15.5)
WBC: 6.1 10*3/uL (ref 4.0–10.5)
nRBC: 0 % (ref 0.0–0.2)

## 2020-02-28 LAB — C-REACTIVE PROTEIN: CRP: 4.2 mg/dL — ABNORMAL HIGH (ref <1.0)

## 2020-02-28 LAB — FERRITIN: Ferritin: 55 ng/mL (ref 24–336)

## 2020-02-28 LAB — PHOSPHORUS: Phosphorus: 3 mg/dL (ref 2.5–4.6)

## 2020-02-28 LAB — MAGNESIUM: Magnesium: 1.9 mg/dL (ref 1.7–2.4)

## 2020-02-28 LAB — D-DIMER, QUANTITATIVE: D-Dimer, Quant: 0.46 ug/mL-FEU (ref 0.00–0.50)

## 2020-02-28 MED ORDER — METHYLPREDNISOLONE SODIUM SUCC 40 MG IJ SOLR
40.0000 mg | Freq: Two times a day (BID) | INTRAMUSCULAR | Status: DC
  Administered 2020-02-28 – 2020-02-29 (×3): 40 mg via INTRAVENOUS
  Filled 2020-02-28 (×2): qty 1

## 2020-02-28 NOTE — Care Management Important Message (Signed)
Important Message  Patient Details IM Letter placed in Patient's door [facility]. Name: NYEEM STOKE MRN: [id number] Date of Birth: 1932-02-28   Medicare Important Message Given:  Yes     Kerin Salen 02/28/2020, 12:46 PM

## 2020-02-28 NOTE — Progress Notes (Signed)
PROGRESS NOTE  Johnathan [name]  DOB: 04/23/1932  PCP: Darreld Mclean, MD DDU:[id number]  DOA: 02/25/2020  LOS: 3 days   Chief Complaint  Patient presents with  . Covid Positive   Brief narrative: Johnathan [name] is a 87 y.o. male with PMH significant for COPD, HLD, HTN. Patient presented to the ED on 02/25/2020 with complaint of fatigue, body aches, cough, progressive worsening for a week. He presented to the ED on 1/31, diagnosed with COVID-19.  He was not hypoxic and hence sent to the ED. If symptoms continue to worsen and hence return back to ED on 2/4. Pfizer vaccination w/ booster.  In the ED, he was hypoxic and required 2 L oxygen by nasal cannula. Chest x-ray did not show any infiltrates Patient was symptomatic even with mild Covid infection probably because of underlying COPD. Admitted to hospitalist service  Subjective: Patient was seen and examined this morning. Pleasant elderly Caucasian male.  Propped up in bed.  Not in distress.  On 2 L oxygen by nasal cannula. Feels better than at presentation.  Assessment/Plan: COVID infection Acute exacerbation of COPD Acute respiratory failure with hypoxia  -Presented with hypoxia.  -COVID test: PCR positive -Chest imaging: Did not show any infiltrate. -Patient was symptomatic even with mild Covid infection probably because of underlying COPD.  -Treatment: Currently on a 5-day course of IV remdesivir till 2/8.  Also on IV Solu-Medrol 40 mg twice daily. -Progression: Patient feels somewhat better.  On 2 L oxygen at rest this morning.  CRP improving, 4.2 this morning. -Protonix while on steroids. -Oxygen - SpO2: 93 % O2 Flow Rate (L/min): 2 L/min -Continue supportive care: Vitamin C, Zinc, PRN inhalers, Tylenol, Antitussives (benzonatate/ Mucinex/Tussionex).   -Encouraged incentive spirometry, prone position, out of bed and early mobilization as much as possible -Continue airborne/contact isolation precautions for duration of  3 weeks from the day of diagnosis. -WBC and inflammatory markers trend as below.  Recent Labs  Lab 02/25/20 1150 02/25/20 1151 02/25/20 1330 02/25/20 2052 02/26/20 0421 02/27/20 0410 02/28/20 0311  WBC  --  6.0  --  4.1 4.8 4.7 6.1  LATICACIDVEN 2.8*  --  1.9  --   --   --   --   PROCALCITON  --  <0.10  --   --   --   --   --   DDIMER  --  1.02*  --   --  0.81* 0.49 0.46  FERRITIN  --  63  --   --  66 64 55  LDH  --  158  --   --   --   --   --   CRP  --  6.3*  --   --  7.5* 7.3* 4.2*  ALT  --  25  --   --  23 24 27    The treatment plan and use of medications and known side effects were discussed with patient/family. Some of the medications used are based on case reports/anecdotal data.  All other medications being used in the management of COVID-19 based on limited study data.  Complete risks and long-term side effects are unknown, however in the best clinical judgment they seem to be of some benefit.  Patient wanted to proceed with treatment options provided.  Lactic acidosis -Lactic acid level was initially elevated to 2.8 which improved to 1.9 subsequently with gentle IV hydration.  Currently not on IV fluid Recent Labs  Lab 02/25/20 1150 02/25/20 1330  LATICACIDVEN 2.8* 1.9  Essential hypertension -Blood pressure remains controlled on amlodipine 5 mg daily, Terazosin 1 mg twice daily.  Pre-diabetes -A1c 6.5 on 2/4. -Blood glucose level less than 200 and morning blood work -Continue carb modified diet.  HLD -Continue pravastatin  Hx of Raynaud's -was on sildenafil, but no longer taking; follow-up as an outpatient  Mobility: PT eval ordered. Code Status:   Code Status: Full Code  Nutritional status: Body mass index is 21.86 kg/m.     Diet Order            Diet Carb Modified Fluid consistency: Thin; Room service appropriate? Yes  Diet effective now                 DVT prophylaxis: enoxaparin (LOVENOX) injection 40 mg Start: 02/25/20 2200    Antimicrobials:  None Fluid: None Consultants: None Family Communication:  None at bedside  Status is: Inpatient  Remains inpatient appropriate because: Currently getting inpatient treatment for Covid and COPD exacerbation  Dispo: The patient is from: Home              Anticipated d/c is to: Home hopefully              Anticipated d/c date is: 2 days              Patient currently is not medically stable to d/c.   Difficult to place patient No       Infusions:  . remdesivir 100 mg in NS 100 mL 100 mg (02/28/20 1001)    Scheduled Meds: . amLODipine  5 mg Oral Daily  . vitamin C  500 mg Oral Daily  . aspirin EC  81 mg Oral Daily  . chlorpheniramine-HYDROcodone  5 mL Oral Q12H  . enoxaparin (LOVENOX) injection  40 mg Subcutaneous Q24H  . guaiFENesin-dextromethorphan  10 mL Oral Q6H  . Ipratropium-Albuterol  1 puff Inhalation Q6H  . loratadine  10 mg Oral Daily  . methylPREDNISolone (SOLU-MEDROL) injection  40 mg Intravenous Q12H  . multivitamin with minerals  1 tablet Oral Daily  . pravastatin  20 mg Oral Daily  . primidone  50 mg Oral BID  . terazosin  1 mg Oral BID  . tetrahydrozoline  1 drop Both Eyes BID  . vitamin B-12  500 mcg Oral Daily  . zinc sulfate  220 mg Oral Daily    Antimicrobials: Anti-infectives (From admission, onward)   Start     Dose/Rate Route Frequency Ordered Stop   02/26/20 1000  remdesivir 100 mg in sodium chloride 0.9 % 100 mL IVPB  Status:  Discontinued       "Followed by" Linked Group Details   100 mg 200 mL/hr over 30 Minutes Intravenous Daily 02/25/20 2039 02/25/20 2045   02/26/20 1000  remdesivir 100 mg in sodium chloride 0.9 % 100 mL IVPB        100 mg 200 mL/hr over 30 Minutes Intravenous Daily 02/25/20 1409 03/01/20 0959   02/25/20 1530  remdesivir 200 mg in sodium chloride 0.9% 250 mL IVPB        200 mg 580 mL/hr over 30 Minutes Intravenous Once 02/25/20 1409 02/25/20 1505   02/25/20 1415  remdesivir 200 mg in sodium chloride  0.9% 250 mL IVPB  Status:  Discontinued       "Followed by" Linked Group Details   200 mg 580 mL/hr over 30 Minutes Intravenous Once 02/25/20 2039 02/25/20 2045      PRN meds: meloxicam, ondansetron   Objective: Vitals:  02/28/20 1503 02/28/20 1508  BP:  (!) 142/79  Pulse:  75  Resp:  16  Temp:  98.1 F (36.7 C)  SpO2: (!) 85% 93%    Intake/Output Summary (Last 24 hours) at 02/28/2020 1724 Last data filed at 02/28/2020 1300 Gross per 24 hour  Intake 826 ml  Output 1900 ml  Net -1074 ml   Filed Weights   02/25/20 2100  Weight: 69.1 kg   Weight change:  Body mass index is 21.86 kg/m.   Physical Exam: General exam: Pleasant, elderly Caucasian male.  Not in distress. Skin: No rashes, lesions or ulcers. HEENT: Atraumatic, normocephalic, no obvious bleeding Lungs: Clear to auscultation bilaterally CVS: Regular rate and rhythm, no murmur GI/Abd soft, nontender, nondistended, bowels are present CNS: Alert, awake, oriented x3 Psychiatry: Mood appropriate Extremities: No pedal edema, no calf tenderness  Data Review: I have personally reviewed the laboratory data and studies available.  Recent Labs  Lab 02/25/20 1151 02/25/20 2052 02/26/20 0421 02/27/20 0410 02/28/20 0311  WBC 6.0 4.1 4.8 4.7 6.1  NEUTROABS 5.0  --  3.0 3.6 4.9  HGB 13.4 12.7* 12.8* 12.5* 12.7*  HCT 40.6 38.4* 38.9* 37.4* 38.0*  MCV 97.4 97.5 98.5 97.9 97.4  PLT 193 177 136* 172 179   Recent Labs  Lab 02/25/20 1151 02/25/20 2052 02/26/20 0421 02/27/20 0410 02/28/20 0311  NA 134*  --  135 138 136  K 4.2  --  3.9 4.3 4.4  CL 98  --  103 101 98  CO2 23  --  25 26 29   GLUCOSE 141*  --  108* 140* 164*  BUN 22  --  19 20 20   CREATININE 1.06 0.86 0.87 0.77 0.85  CALCIUM 8.9  --  8.6* 8.6* 8.8*  MG  --   --  2.0 1.8 1.9  PHOS  --   --  3.6 3.2 3.0    F/u labs ordered  Signed, Terrilee Croak, MD [facility] 02/28/2020

## 2020-02-29 DIAGNOSIS — U071 COVID-19: Secondary | ICD-10-CM | POA: Diagnosis not present

## 2020-02-29 LAB — COMPREHENSIVE METABOLIC PANEL WITH GFR
ALT: 57 U/L — ABNORMAL HIGH (ref 0–44)
AST: 43 U/L — ABNORMAL HIGH (ref 15–41)
Albumin: 3.3 g/dL — ABNORMAL LOW (ref 3.5–5.0)
Alkaline Phosphatase: 57 U/L (ref 38–126)
Anion gap: 8 (ref 5–15)
BUN: 28 mg/dL — ABNORMAL HIGH (ref 8–23)
CO2: 29 mmol/L (ref 22–32)
Calcium: 8.9 mg/dL (ref 8.9–10.3)
Chloride: 97 mmol/L — ABNORMAL LOW (ref 98–111)
Creatinine, Ser: 0.97 mg/dL (ref 0.61–1.24)
GFR, Estimated: >60 mL/min (ref >60)
Glucose, Bld: 212 mg/dL — ABNORMAL HIGH (ref 70–99)
Potassium: 4.5 mmol/L (ref 3.5–5.1)
Sodium: 134 mmol/L — ABNORMAL LOW (ref 135–145)
Total Bilirubin: 0.5 mg/dL (ref 0.3–1.2)
Total Protein: 6.6 g/dL (ref 6.5–8.1)

## 2020-02-29 LAB — CBC WITH DIFFERENTIAL/PLATELET
Abs Immature Granulocytes: 0.03 10*3/uL (ref 0.00–0.07)
Basophils Absolute: 0 10*3/uL (ref 0.0–0.1)
Basophils Relative: 0 %
Eosinophils Absolute: 0 10*3/uL (ref 0.0–0.5)
Eosinophils Relative: 0 %
HCT: 38.9 % — ABNORMAL LOW (ref 39.0–52.0)
Hemoglobin: 12.9 g/dL — ABNORMAL LOW (ref 13.0–17.0)
Immature Granulocytes: 1 %
Lymphocytes Relative: 14 %
Lymphs Abs: 0.7 10*3/uL (ref 0.7–4.0)
MCH: 32.3 pg (ref 26.0–34.0)
MCHC: 33.2 g/dL (ref 30.0–36.0)
MCV: 97.5 fL (ref 80.0–100.0)
Monocytes Absolute: 0.3 10*3/uL (ref 0.1–1.0)
Monocytes Relative: 6 %
Neutro Abs: 4.2 10*3/uL (ref 1.7–7.7)
Neutrophils Relative %: 79 %
Platelets: 173 10*3/uL (ref 150–400)
RBC: 3.99 MIL/uL — ABNORMAL LOW (ref 4.22–5.81)
RDW: 12.7 % (ref 11.5–15.5)
WBC: 5.3 10*3/uL (ref 4.0–10.5)
nRBC: 0 % (ref 0.0–0.2)

## 2020-02-29 LAB — GLUCOSE, CAPILLARY
Glucose-Capillary: 155 mg/dL — ABNORMAL HIGH (ref 70–99)
Glucose-Capillary: 151 mg/dL — ABNORMAL HIGH (ref 70–99)

## 2020-02-29 LAB — D-DIMER, QUANTITATIVE: D-Dimer, Quant: <0.27 ug/mL-FEU (ref 0.00–0.50)

## 2020-02-29 LAB — PHOSPHORUS: Phosphorus: 3.3 mg/dL (ref 2.5–4.6)

## 2020-02-29 LAB — MAGNESIUM: Magnesium: 2.2 mg/dL (ref 1.7–2.4)

## 2020-02-29 LAB — C-REACTIVE PROTEIN: CRP: 2.7 mg/dL — ABNORMAL HIGH (ref <1.0)

## 2020-02-29 LAB — FERRITIN: Ferritin: 45 ng/mL (ref 24–336)

## 2020-02-29 MED ORDER — INSULIN ASPART 100 UNIT/ML ~~LOC~~ SOLN: 0.0000 [IU] | Freq: Every day | SUBCUTANEOUS | Status: DC

## 2020-02-29 MED ORDER — IPRATROPIUM-ALBUTEROL 20-100 MCG/ACT IN AERS
1.0000 | INHALATION_SPRAY | Freq: Two times a day (BID) | RESPIRATORY_TRACT | Status: DC
  Administered 2020-02-29: 1 via RESPIRATORY_TRACT

## 2020-02-29 MED ORDER — HYDROCOD POLST-CPM POLST ER 10-8 MG/5ML PO SUER: 5.0000 mL | Freq: Two times a day (BID) | ORAL | 0 refills | Status: DC

## 2020-02-29 MED ORDER — AMLODIPINE BESYLATE 10 MG PO TABS
10.0000 mg | ORAL_TABLET | Freq: Every day | ORAL | Status: DC
Start: 2020-03-01 — End: 2020-02-29

## 2020-02-29 MED ORDER — AMLODIPINE BESYLATE 10 MG PO TABS: 10.0000 mg | ORAL_TABLET | Freq: Every day | ORAL | 2 refills | Status: DC

## 2020-02-29 MED ORDER — INSULIN ASPART 100 UNIT/ML ~~LOC~~ SOLN
0.0000 [IU] | Freq: Three times a day (TID) | SUBCUTANEOUS | Status: DC
  Administered 2020-02-29: 2 [IU] via SUBCUTANEOUS

## 2020-02-29 MED ORDER — PREDNISONE 10 MG PO TABS: ORAL_TABLET | ORAL | 0 refills | Status: DC

## 2020-02-29 NOTE — Plan of Care (Signed)

## 2020-02-29 NOTE — Evaluation (Signed)
Physical Therapy Evaluation Patient Details Name: Johnathan [name] MRN: [id number] DOB: [birth year] Today's Date: 02/29/2020   History of Present Illness  Johnathan [name] is a 87 y.o. male with PMH significant for COPD, HLD, HTN.  Patient presented to the ED on 02/25/2020 with complaint of fatigue, body aches, cough, progressive worsening for a week.  He previously presented to the ED on 1/31, diagnosed with COVID-19 and returned home.  Clinical Impression  Patient resting in bed on 1 L At 91%. Patient ambulated  On Ra with SPO2 dropping to 84%. Patient ambulated on 3 l with SPO2 dropping to 87%. Encouraged pursed li[p breaths and use  Of IS and flutter.   Patient encouraged to ambulate frequently and instructed in exercises to perform while standing.  Pt admitted with above diagnosis.   Pt currently with functional limitations due to the deficits listed below (see PT Problem List). Pt will benefit from skilled PT to increase their independence and safety with mobility to allow discharge to the venue listed below.   .    Follow Up Recommendations No PT follow up (pt declined)    Equipment Recommendations  None recommended by PT    Recommendations for Other Services       Precautions / Restrictions Precautions Precautions: Fall Precaution Comments: monitor sats Restrictions Weight Bearing Restrictions: No      Mobility  Bed Mobility Overal bed mobility: Independent                  Transfers Overall transfer level: Needs assistance Equipment used: Rolling walker (2 wheeled) Transfers: Sit to/from Stand Sit to Stand: Supervision            Ambulation/Gait Ambulation/Gait assistance: Min guard Gait Distance (Feet): 60 Feet Assistive device: Rolling walker (2 wheeled) Gait Pattern/deviations: Step-through pattern Gait velocity: decr   General Gait Details: patient managed around room with Rw, making turns, no balance loss  Stairs            Wheelchair  Mobility    Modified Rankin (Stroke Patients Only)       Balance                                             Pertinent Vitals/Pain Pain Assessment: No/denies pain    Home Living Family/patient expects to be discharged to:: Private residence   Available Help at Discharge: Friend(s) Type of Home: House Home Access: Stairs to enter   Technical brewer of Steps: 1 Home Layout: Two level;1/2 bath on main level Home Equipment: Walker - 2 wheels      Prior Function Level of Independence: Independent         Comments: works, active at baseline     Hand Dominance   Dominant Hand: Right    Extremity/Trunk Assessment   Upper Extremity Assessment Upper Extremity Assessment: Generalized weakness    Lower Extremity Assessment Lower Extremity Assessment: Generalized weakness    Cervical / Trunk Assessment Cervical / Trunk Assessment: Kyphotic  Communication   Communication: No difficulties  Cognition Arousal/Alertness: Awake/alert Behavior During Therapy: WFL for tasks assessed/performed Overall Cognitive Status: Within Functional Limits for tasks assessed  General Comments      Exercises     Assessment/Plan    PT Assessment Patient needs continued PT services  PT Problem List Decreased strength;Decreased balance;Decreased knowledge of precautions;Decreased knowledge of use of DME;Cardiopulmonary status limiting activity;Decreased activity tolerance       PT Treatment Interventions DME instruction;Therapeutic activities;Gait training;Therapeutic exercise;Patient/family education;Functional mobility training    PT Goals (Current goals can be found in the Care Plan section)  Acute Rehab PT Goals Patient Stated Goal: go home PT Goal Formulation: With patient Time For Goal Achievement: 03/14/20 Potential to Achieve Goals: Good    Frequency Min 3X/week   Barriers to discharge         Co-evaluation               AM-PAC PT "6 Clicks" Mobility  Outcome Measure Help needed turning from your back to your side while in a flat bed without using bedrails?: None Help needed moving from lying on your back to sitting on the side of a flat bed without using bedrails?: None Help needed moving to and from a bed to a chair (including a wheelchair)?: None Help needed standing up from a chair using your arms (e.g., wheelchair or bedside chair)?: None Help needed to walk in hospital room?: A Little Help needed climbing 3-5 steps with a railing? : A Lot 6 Click Score: 21    End of Session Equipment Utilized During Treatment: Oxygen Activity Tolerance: Patient tolerated treatment well Patient left: in chair;with call bell/phone within reach Nurse Communication: Mobility status (need O2) PT Visit Diagnosis: Unsteadiness on feet (R26.81);Difficulty in walking, not elsewhere classified (R26.2)    Time: 8527-7824 PT Time Calculation (min) (ACUTE ONLY): 35 min   Charges:   PT Evaluation $PT Eval Low Complexity: 1 Low PT Treatments $Gait Training: 8-22 mins        Tresa Endo PT Acute Rehabilitation Services Pager [phone] Office [phone]   Claretha Cooper 02/29/2020, 1:13 PM

## 2020-02-29 NOTE — Discharge Summary (Signed)
Physician Discharge Summary  Johnathan [name] UDJ:[id number] DOB: 12-08-1932 DOA: 02/25/2020  PCP: Johnathan Mclean, MD  Admit date: 02/25/2020 Discharge date: 02/29/2020  Admitted From: Home Discharge disposition: Home with O2   Code Status: Full Code  Diet Recommendation: Cardiac diet  Discharge Diagnosis:   Principal Problem:   COVID-19 Active Problems:   COPD with acute exacerbation [facility])   Chief Complaint  Patient presents with  . Covid Positive   Brief narrative: Johnathan [name] is a 88 y.o. male with PMH significant for COPD, HLD, HTN. Patient presented to the ED on 02/25/2020 with complaint of fatigue, body aches, cough, progressive worsening for a week. He presented to the ED on 1/31, diagnosed with COVID-19.  He was not hypoxic and hence sent to the ED. If symptoms continue to worsen and hence return back to ED on 2/4. Pfizer vaccination w/ booster.  In the ED, he was hypoxic and required 2 L oxygen by nasal cannula. Chest x-ray did not show any infiltrates Patient was symptomatic even with mild Covid infection probably because of underlying COPD. Admitted to hospitalist service  Subjective: Patient was seen and examined this morning. Propped up in bed.  Not in distress.  On 1 L oxygen by nasal cannula.  Feels better. Completed the course of IV remdesivir today.  Assessment/Plan: COVID infection Acute exacerbation of COPD Acute respiratory failure with hypoxia  -Presented with hypoxia.  -COVID test: PCR positive -Chest imaging: Did not show any infiltrate. -Patient was symptomatic even with mild Covid infection probably because of underlying COPD. -Patient completed 5-day course of IV remdesivir this morning 2/8.  He is currently on Solu-Medrol IV 40 mg twice daily.  We will switch to oral prednisone and plan to discharge on a tapering course of the same. -Currently requiring low flow oxygen at 1 L/min at rest but requires higher flow on ambulation.  Home oxygen  ordered.  -Continue supportive care with cough medicines -Continue airborne/contact isolation precautions for duration of 3 weeks from the day of diagnosis. -WBC and inflammatory markers trend as below.  Recent Labs  Lab 02/25/20 1150 02/25/20 1151 02/25/20 1151 02/25/20 1330 02/25/20 2052 02/26/20 0421 02/27/20 0410 02/28/20 0311 02/29/20 0349  WBC  --  6.0   < >  --  4.1 4.8 4.7 6.1 5.3  LATICACIDVEN 2.8*  --   --  1.9  --   --   --   --   --   PROCALCITON  --  <0.10  --   --   --   --   --   --   --   DDIMER  --  1.02*  --   --   --  0.81* 0.49 0.46 <0.27  FERRITIN  --  63  --   --   --  66 64 55 45  LDH  --  158  --   --   --   --   --   --   --   CRP  --  6.3*  --   --   --  7.5* 7.3* 4.2* 2.7*  ALT  --  25  --   --   --  23 24 27  57*   < > = values in this interval not displayed.   The treatment plan and use of medications and known side effects were discussed with patient/family. Some of the medications used are based on case reports/anecdotal data.  All other medications being used in the  management of COVID-19 based on limited study data.  Complete risks and long-term side effects are unknown, however in the best clinical judgment they seem to be of some benefit.  Patient wanted to proceed with treatment options provided.  Lactic acidosis -Lactic acid level was initially elevated to 2.8 which improved to 1.9 subsequently with gentle IV hydration.  Currently not on IV fluid Recent Labs  Lab 02/25/20 1150 02/25/20 1330  LATICACIDVEN 2.8* 1.9   Essential hypertension -Blood pressure remains elevated on amlodipine 5 mg daily, Terazosin 1 mg twice daily. -We will increase amlodipine to 10 mg daily.  Pre-diabetes -A1c 6.5 on 2/4. -On Metformin at home. -Continue carb modified diet.  HLD -Continue pravastatin  Hx of Raynaud's -was on sildenafil, but no longer taking; follow-up as an outpatient.  Okay to discharge to home with home oxygen. Home health PT was  recommended but patient denied.   Wound care: Incision - 3 Ports Abdomen Umbilicus Right;Medial Left;Medial (Active)  Placement Date/Time: 05/06/19 0808   Location of Ports: Abdomen  Location Orientation: Umbilicus  Location Orientation: Right;Medial  Location Orientation: Left;Medial    Assessments 05/06/2019  9:45 AM 05/07/2019  7:45 AM  Port 1 Site Assessment Clean;Dry Clean;Dry  Port 1 Margins Attached edges (approximated) Attached edges (approximated)  Port 1 Drainage Amount None None  Port 1 Dressing Type Adhesive bandage Transparent dressing;Gauze (Comment)  Port 1 Dressing Status Clean;Dry;Intact Clean;Dry;Intact  Port 2 Site Assessment Clean;Dry -  Port 2 Margins Attached edges (approximated) -  Port 2 Drainage Amount None None  Port 2 Dressing Type Adhesive bandage Transparent dressing;Gauze (Comment)  Port 2 Dressing Status Clean;Dry;Intact Clean;Dry;Intact  Port 3 Site Assessment Clean;Dry -  Port 3 Margins Attached edges (approximated) -  Port 3 Drainage Amount None None  Port 3 Dressing Type Adhesive bandage Transparent dressing;Gauze (Comment)  Port 3 Dressing Status Clean;Dry;Intact Clean;Dry;Intact     No Linked orders to display     Incision (Closed) 05/06/19 Abdomen (Active)  Date First Assessed/Time First Assessed: 05/06/19 0915   Location: Abdomen    Assessments 05/06/2019  4:21 PM 05/07/2019  7:45 AM  Dressing Type Transparent dressing;Gauze (Comment) Transparent dressing;Gauze (Comment)  Dressing Clean;Dry;Intact Clean;Dry;Intact  Site / Wound Assessment Dressing in place / Unable to assess Dressing in place / Unable to assess  Drainage Amount None None  Treatment Ice applied Ice applied     No Linked orders to display    Discharge Exam:   Vitals:   02/28/20 2042 02/28/20 2338 02/29/20 0509 02/29/20 0600  BP: (!) 196/95 (!) 163/85 (!) 162/83   Pulse: 75 64 (!) 57   Resp: 20  18   Temp: 98.2 F (36.8 C)  97.7 F (36.5 C)   TempSrc: Oral  Oral    SpO2: 95%  (!) 85% 92%  Weight:      Height:        Body mass index is 21.86 kg/m.  General exam: Pleasant elderly Caucasian male.  Propped up in bed.  Breathing on 1 L oxygen Skin: No rashes, lesions or ulcers. HEENT: Atraumatic, normocephalic, no obvious bleeding Lungs: Clear to auscultation bilaterally CVS: Regular rate and rhythm, no murmur GI/Abd soft, nontender, nondistended, bowel sound present CNS: Alert, awake, oriented x3 Psychiatry: Mood appropriate Extremities: No pedal edema, no calf tenderness  Follow ups:   Discharge Instructions    Diet - low sodium heart healthy   Complete by: As directed    Diet Carb Modified   Complete by: As directed  Increase activity slowly   Complete by: As directed       Follow-up Information    Copland, Gay Filler, MD Follow up.   Specialty: Family Medicine Contact information: [address] [place] [address] [phone]        O'Neal, Cassie Freer, MD .   Specialties: Internal Medicine, Cardiology, Radiology Contact information: [place] [address] [phone]               Recommendations for Outpatient Follow-Up:   1. Follow-up with PCP as an outpatient  Discharge Instructions:  Follow with Primary MD Copland, Gay Filler, MD in 7 days   Get CBC/BMP checked in next visit within 1 week by PCP or SNF MD ( we routinely change or add medications that can affect your baseline labs and fluid status, therefore we recommend that you get the mentioned basic workup next visit with your PCP, your PCP may decide not to get them or add new tests based on their clinical decision)  On your next visit with your PCP, please Get Medicines reviewed and adjusted.  Please request your PCP  to go over all Hospital Tests and Procedure/Radiological results at the follow up, please get all Hospital records sent to your Prim MD by signing hospital release before you go home.  Activity: As  tolerated with Full fall precautions use walker/cane & assistance as needed  For Heart failure patients - Check your Weight same time everyday, if you gain over 2 pounds, or you develop in leg swelling, experience more shortness of breath or chest pain, call your Primary MD immediately. Follow Cardiac Low Salt Diet and 1.5 lit/day fluid restriction.  If you have smoked or chewed Tobacco in the last 2 yrs please stop smoking, stop any regular Alcohol  and or any Recreational drug use.  If you experience worsening of your admission symptoms, develop shortness of breath, life threatening emergency, suicidal or homicidal thoughts you must seek medical attention immediately by calling 911 or calling your MD immediately  if symptoms less severe.  You Must read complete instructions/literature along with all the possible adverse reactions/side effects for all the Medicines you take and that have been prescribed to you. Take any new Medicines after you have completely understood and accpet all the possible adverse reactions/side effects.   Do not drive, operate heavy machinery, perform activities at heights, swimming or participation in water activities or provide baby sitting services if your were admitted for syncope or siezures until you have seen by Primary MD or a Neurologist and advised to do so again.  Do not drive when taking Pain medications.  Do not take more than prescribed Pain, Sleep and Anxiety Medications  Wear Seat belts while driving.   Please note You were cared for by a hospitalist during your hospital stay. If you have any questions about your discharge medications or the care you received while you were in the hospital after you are discharged, you can call the unit and asked to speak with the hospitalist on call if the hospitalist that took care of you is not available. Once you are discharged, your primary care physician will handle any further medical issues. Please note that NO  REFILLS for any discharge medications will be authorized once you are discharged, as it is imperative that you return to your primary care physician (or establish a relationship with a primary care physician if you do not have one) for your aftercare needs  so that they can reassess your need for medications and monitor your lab values.    Allergies as of 02/29/2020   No Known Allergies     Medication List    STOP taking these medications   guaifenesin 100 MG/5ML syrup Commonly known as: ROBITUSSIN   loratadine 10 MG tablet Commonly known as: CLARITIN     TAKE these medications   albuterol 108 (90 Base) MCG/ACT inhaler Commonly known as: VENTOLIN HFA Inhale 2 puffs into the lungs every 4 (four) hours as needed for wheezing or shortness of breath (or coughing).   amLODipine 10 MG tablet Commonly known as: NORVASC Take 1 tablet (10 mg total) by mouth daily. Start taking on: March 01, 2020 What changed:   medication strength  how much to take   aspirin EC 81 MG tablet Take 81 mg by mouth daily.   B-12 1000 MCG Caps Take 1 capsule by mouth daily.   benzonatate 100 MG capsule Commonly known as: TESSALON Take 1 capsule (100 mg total) by mouth every 8 (eight) hours as needed for cough.   budesonide-formoterol 160-4.5 MCG/ACT inhaler Commonly known as: SYMBICORT Inhale 2 puffs into the lungs 2 (two) times daily.   chlorpheniramine-HYDROcodone 10-8 MG/5ML Suer Commonly known as: TUSSIONEX Take 5 mLs by mouth every 12 (twelve) hours.   Flaxseed Oil 1200 MG Caps Take 1 capsule by mouth daily.   Ginkgo Biloba Extr Take 1 tablet by mouth 2 (two) times daily.   HIMALAYAN GOJI PO Take 1 tablet by mouth in the morning and at bedtime.   meloxicam 15 MG tablet Commonly known as: MOBIC Take 1 tablet (15 mg total) by mouth daily. Use as needed for joint pain   metFORMIN 500 MG tablet Commonly known as: GLUCOPHAGE Take 250 mg by mouth daily.   multivitamin with minerals  Tabs tablet Take 1 tablet by mouth daily.   omeprazole 20 MG capsule Commonly known as: PRILOSEC Take 1 capsule (20 mg total) by mouth 2 (two) times daily.   ondansetron 4 MG tablet Commonly known as: ZOFRAN Take 1 tablet (4 mg total) by mouth every 8 (eight) hours as needed for nausea or vomiting.   pravastatin 40 MG tablet Commonly known as: PRAVACHOL Take 0.5 tablets (20 mg total) by mouth daily. Takes 1/2 pill daily   predniSONE 10 MG tablet Commonly known as: DELTASONE 4 tabs twice daily X 3 days -->4 tabs daily X 3 days-->3 tabs daily X 3 days--> 2 tabs daily X 3 days--> 1 tab daily X 3 days, then STOP. What changed:   medication strength  how much to take  how to take this  when to take this  additional instructions   primidone 50 MG tablet Commonly known as: MYSOLINE Take 1 tablet (50 mg total) by mouth 2 (two) times daily.   sildenafil 20 MG tablet Commonly known as: REVATIO Take 0.5 tablets (10 mg total) by mouth daily. Use for Raynaud's symptoms   terazosin 1 MG capsule Commonly known as: HYTRIN Take 1 capsule (1 mg total) by mouth 2 (two) times daily.   VISINE OP Apply 1 drop to eye daily.            Durable Medical Equipment  (From admission, onward)         Start     Ordered   02/29/20 1315  DME Oxygen  Once       Question Answer Comment  Length of Need Lifetime   Mode or (Route) Nasal  cannula   Liters per Minute 4   Oxygen delivery system Gas      02/29/20 1316          Time coordinating discharge: 35 minutes  The results of significant diagnostics from this hospitalization (including imaging, microbiology, ancillary and laboratory) are listed below for reference.    Procedures and Diagnostic Studies:   DG Chest Port 1 View  Result Date: 02/25/2020 CLINICAL DATA:  COVID-19.  Hypoxia. EXAM: PORTABLE CHEST 1 VIEW COMPARISON:  February 20, 2020 FINDINGS: The heart, hila, mediastinum are normal. No pneumothorax. Mild opacity in  left base is stable, likely atelectasis. No suspicious focal infiltrates are identified on today's study. Decreased lung markings in the apices. Hyperexpansion of the lungs. IMPRESSION: 1. Emphysematous changes. 2. No acute infiltrate identified to suggest pneumonia. Electronically Signed   By: Dorise Bullion III M.D   On: 02/25/2020 12:19     Labs:   Basic Metabolic Panel: Recent Labs  Lab 02/25/20 1151 02/25/20 2052 02/26/20 0421 02/27/20 0410 02/28/20 0311 02/29/20 0349  NA 134*  --  135 138 136 134*  K 4.2  --  3.9 4.3 4.4 4.5  CL 98  --  103 101 98 97*  CO2 23  --  25 26 29 29   GLUCOSE 141*  --  108* 140* 164* 212*  BUN 22  --  19 20 20  28*  CREATININE 1.06 0.86 0.87 0.77 0.85 0.97  CALCIUM 8.9  --  8.6* 8.6* 8.8* 8.9  MG  --   --  2.0 1.8 1.9 2.2  PHOS  --   --  3.6 3.2 3.0 3.3   GFR Estimated Creatinine Clearance: 54.4 mL/min (by C-G formula based on SCr of 0.97 mg/dL). Liver Function Tests: Recent Labs  Lab 02/25/20 1151 02/26/20 0421 02/27/20 0410 02/28/20 0311 02/29/20 0349  AST 30 27 31 28  43*  ALT 25 23 24 27  57*  ALKPHOS 65 54 56 55 57  BILITOT 0.4 0.3 0.5 0.6 0.5  PROT 7.3 6.8 6.4* 6.5 6.6  ALBUMIN 3.6 3.3* 3.1* 3.0* 3.3*   No results for input(s): LIPASE, AMYLASE in the last 168 hours. No results for input(s): AMMONIA in the last 168 hours. Coagulation profile No results for input(s): INR, PROTIME in the last 168 hours.  CBC: Recent Labs  Lab 02/25/20 1151 02/25/20 2052 02/26/20 0421 02/27/20 0410 02/28/20 0311 02/29/20 0349  WBC 6.0 4.1 4.8 4.7 6.1 5.3  NEUTROABS 5.0  --  3.0 3.6 4.9 4.2  HGB 13.4 12.7* 12.8* 12.5* 12.7* 12.9*  HCT 40.6 38.4* 38.9* 37.4* 38.0* 38.9*  MCV 97.4 97.5 98.5 97.9 97.4 97.5  PLT 193 177 136* 172 179 173   Cardiac Enzymes: No results for input(s): CKTOTAL, CKMB, CKMBINDEX, TROPONINI in the last 168 hours. BNP: Invalid input(s): POCBNP CBG: Recent Labs  Lab 02/29/20 0746 02/29/20 1122  GLUCAP 155* 151*    D-Dimer Recent Labs    02/28/20 0311 02/29/20 0349  DDIMER 0.46 <0.27   Hgb A1c No results for input(s): HGBA1C in the last 72 hours. Lipid Profile No results for input(s): CHOL, HDL, LDLCALC, TRIG, CHOLHDL, LDLDIRECT in the last 72 hours. Thyroid function studies No results for input(s): TSH, T4TOTAL, T3FREE, THYROIDAB in the last 72 hours.  Invalid input(s): FREET3 Anemia work up Recent Labs    02/28/20 0311 02/29/20 0349  FERRITIN 55 40   Microbiology Recent Results (from the past 240 hour(s))  Blood Culture (routine x 2)     Status:  None (Preliminary result)   Collection Time: 02/25/20 11:35 AM   Specimen: BLOOD  Result Value Ref Range Status   Specimen Description   Final    BLOOD BLOOD LEFT FOREARM Performed at [facility] [address] [place], [place] [address]    Special Requests   Final    BOTTLES DRAWN AEROBIC AND ANAEROBIC Blood Culture adequate volume Performed at [facility] [address] [place], [place] [address]    Culture   Final    NO GROWTH 4 DAYS Performed at [facility], [place] [address], [place], [place] [address]    Report Status PENDING  Incomplete  Blood Culture (routine x 2)     Status: None (Preliminary result)   Collection Time: 02/25/20 11:50 AM   Specimen: BLOOD  Result Value Ref Range Status   Specimen Description   Final    BLOOD RIGHT ANTECUBITAL Performed at [facility] [address], [place], [place] [address]    Special Requests   Final    BOTTLES DRAWN AEROBIC AND ANAEROBIC Blood Culture adequate volume Performed at [facility] [address], [place], [place] [address]    Culture   Final    NO GROWTH 4 DAYS Performed at [facility], [place] [address], [place], [place] [address]    Report Status PENDING  Incomplete     Signed: Marlowe Aschoff Rohaan Durnil  [facility] 02/29/2020, 1:16 PM

## 2020-02-29 NOTE — TOC Transition Note (Signed)
Transition of Care [facility]) - CM/SW Discharge Note   Patient Details  Name: Johnathan [name] MRN: [id number] Date of Birth: 09/01/1932  Transition of Care [facility]) CM/SW Contact:  Trish Mage, LCSW Phone Number: 02/29/2020, 2:07 PM   Clinical Narrative:   Patient who is stable for discharge is in need of home O2, has a ride. SAT note and order seen and appreciated.  Contacted Zach with [facility] who will arrange for delivery of travel canister, home unit. Patient declines [facility]. No further needs identified.  TOC sign off.    Final next level of care: Home/Self Care Barriers to Discharge: No Barriers Identified   Patient Goals and CMS Choice        Discharge Placement                       Discharge Plan and Services                                     Social Determinants of Health (SDOH) Interventions     Readmission Risk Interventions No flowsheet data found.

## 2020-02-29 NOTE — Progress Notes (Signed)
SATURATION QUALIFICATIONS: (This note is used to comply with regulatory documentation for home oxygen)  Patient Saturations on Room Air at Rest =90  Patient Saturations on Room Air while Ambulating = 83%  Patient Saturations on 3 Liters of oxygen while Ambulating = 87%  Please briefly explain why patient needs home oxygen: requires oxygen to maintain Saturation > 88% while ambulating.   [facility] Pager [phone] Office [phone]

## 2020-02-29 NOTE — Progress Notes (Addendum)
Pt. discharged via wheelchair accompanied by staff. Pt. Is alert and oriented, vital sign stable. Discharge instruction and education discussed with patient, pt. Verbalized understanding. All patient belongings are with the family.

## 2020-03-01 LAB — CULTURE, BLOOD (ROUTINE X 2)
Culture: NO GROWTH
Culture: NO GROWTH
Special Requests: ADEQUATE
Special Requests: ADEQUATE

## 2020-03-02 ENCOUNTER — Telehealth: Payer: Self-pay

## 2020-03-02 ENCOUNTER — Telehealth (INDEPENDENT_AMBULATORY_CARE_PROVIDER_SITE_OTHER): Payer: Medicare HMO | Admitting: Family Medicine

## 2020-03-02 ENCOUNTER — Other Ambulatory Visit: Payer: Self-pay

## 2020-03-02 DIAGNOSIS — U071 COVID-19: Secondary | ICD-10-CM | POA: Diagnosis not present

## 2020-03-02 DIAGNOSIS — R0902 Hypoxemia: Secondary | ICD-10-CM

## 2020-03-02 DIAGNOSIS — Z09 Encounter for follow-up examination after completed treatment for conditions other than malignant neoplasm: Secondary | ICD-10-CM | POA: Diagnosis not present

## 2020-03-02 NOTE — Telephone Encounter (Signed)
Patient has a phone visit scheduled for his hospital f/u. Patient unable to do a video visit. Does not qualify for TCM.

## 2020-03-02 NOTE — Progress Notes (Signed)
[place] at [facility] [address], [place], [place] [address] [phone]  Date:  03/02/2020   Name:  Johnathan [name]   DOB:  1932/08/10   MRN:  [id number]  PCP:  Darreld Mclean, MD    Chief Complaint: No chief complaint on file.   History of Present Illness:  Johnathan [name] is a 87 y.o. very pleasant male patient who presents with the following:  Telephone visit today for follow-up Patient location is home, my location is office.  Patient identity confirmed with 2 factors, he gives consent for virtual visit today Connected with patient via telephone Johnathan [name] was recently hospitalized with COVID-19 and COPD exacerbation He was treated with 3 days of IV remdesivir, has been discharged to home with oxygen and steroid taper  Admit date: 02/25/2020 Discharge date: 02/29/2020 Admitted From: Home Discharge disposition: Home with O2  Assessment/Plan: COVID infection Acute exacerbation of COPD Acute respiratory failure with hypoxia  -Presented with hypoxia.  -COVID test: PCR positive -Chest imaging: Did not show any infiltrate. -Patient was symptomatic even with mild Covid infection probably because of underlying COPD. -Patient completed 5-day course of IV remdesivir this morning 2/8.  He is currently on Solu-Medrol IV 40 mg twice daily.  We will switch to oral prednisone and plan to discharge on a tapering course of the same. -Currently requiring low flow oxygen at 1 L/min at rest but requires higher flow on ambulation.  Home oxygen ordered.  -Continue supportive care with cough medicines -Continue airborne/contact isolation precautions for duration of 3 weeks from the day of diagnosis. -WBC and inflammatory markers trend as below.  Last Labs              Recent Labs  Lab 02/25/20 1150 02/25/20 1151 02/25/20 1151 02/25/20 1330 02/25/20 2052 02/26/20 0421 02/27/20 0410 02/28/20 0311 02/29/20 0349  WBC  --  6.0   < >  --   4.1 4.8 4.7 6.1 5.3  LATICACIDVEN 2.8*  --   --  1.9  --   --   --   --   --   PROCALCITON  --  <0.10  --   --   --   --   --   --   --   DDIMER  --  1.02*  --   --   --  0.81* 0.49 0.46 <0.27  FERRITIN  --  63  --   --   --  66 64 55 45  LDH  --  158  --   --   --   --   --   --   --   CRP  --  6.3*  --   --   --  7.5* 7.3* 4.2* 2.7*  ALT  --  25  --   --   --  23 24 27  57*   < > = values in this interval not displayed.     The treatment plan and use of medications and known side effects were discussed with patient/family. Some of the medications used are based on case reports/anecdotal data. All other medications being used in the management of COVID-19 based on limited study data. Complete risks and long-term side effects are unknown, however in the best clinical judgment they seem to be of some benefit. Patientwanted to proceed with treatment options provided.  Lactic acidosis -Lactic acid level was initially elevated to 2.8 which improved to 1.9 subsequently  with gentle IV hydration.  Currently not on IV fluid Essential hypertension -Blood pressure remains elevated on amlodipine 5 mg daily, Terazosin 1 mg twice daily. -We will increase amlodipine to 10 mg daily. Pre-diabetes -A1c 6.5 on 2/4. -On Metformin at home. -Continue carb modified diet HLD -Continuepravastatin Hx of Raynaud's -was on sildenafil, but no longer taking; follow-up as an outpatient. Okay to discharge to home with home oxygen. Home health PT was recommended but patient denied.  Pt notes that he is overall doing okay, but would like home health nursing to come out He does have a pulse ox that he can use that he has not tried it yet He is using oxygen 24/7 right now.  He is not really sure how much oxygen he is using His roommate is helping him out as best as he is able- he does not have family in the area that can help him out His friend is helping with meal prep, etc Johnathan [name] is not able to leave home  right now-he does not have a portable concentrator They are ok on groceries  His breathing feels ok right now No fevers He is able to eat ok, no vomiting   Patient Active Problem List   Diagnosis Date Noted  . COVID-19 02/25/2020  . Shingles 11/14/2019  . Raynaud's syndrome 11/14/2019  . Skin cancer 11/14/2019  . Left inguinal hernia s/p lap repair w mesh 05/06/2019 05/06/2019  . Recurrent right inguinal hernia s/p lap repair w mesh 05/06/2019 03/01/2019  . Diabetes mellitus ([place]) 03/01/2019  . Pneumonia 04/21/2014  . COPD with acute exacerbation ([place]) 04/21/2014    Past Medical History:  Diagnosis Date  . COPD (chronic obstructive pulmonary disease) ([place])   . Diverticulosis   . DM type 2 (diabetes mellitus, type 2) ([place])   . GERD (gastroesophageal reflux disease)    uses prilosec   . Hypertension   . Pneumonia 2018  . Raynaud's disease   . Shortness of breath dyspnea    WITH EXERTION  . Sinusitis   . Tremors of nervous system     Past Surgical History:  Procedure Laterality Date  . COLONOSCOPY  03/27/2005  . HERNIA REPAIR    . INGUINAL HERNIA REPAIR N/A 05/06/2019   Procedure: LAPAROSCOPIC RIGHT AND LEFT INGUINAL HERNIA(S) REPAIR;  Surgeon: Michael Boston, MD;  Location: [facility];  Service: General;  Laterality: N/A;    Social History   Tobacco Use  . Smoking status: Former Smoker    Packs/day: 1.00    Years: 45.00    Pack years: 45.00    Types: Cigarettes    Quit date: 01/21/1994    Years since quitting: 26.1  . Smokeless tobacco: Never Used  Substance Use Topics  . Alcohol use: Yes    Comment: a drink daily with dinner  . Drug use: No    Family History  Problem Relation Age of Onset  . Dementia Brother   . Diabetes Brother   . Heart attack Father   . Dementia Father     No Known Allergies  Medication list has been reviewed and updated.  Current Outpatient Medications on File Prior to Visit  Medication Sig Dispense Refill  .  albuterol (PROVENTIL HFA;VENTOLIN HFA) 108 (90 Base) MCG/ACT inhaler Inhale 2 puffs into the lungs every 4 (four) hours as needed for wheezing or shortness of breath (or coughing). 1 Inhaler 0  . amLODipine (NORVASC) 10 MG tablet Take 1 tablet (10 mg total) by mouth daily.  30 tablet 2  . aspirin EC 81 MG tablet Take 81 mg by mouth daily.    . benzonatate (TESSALON) 100 MG capsule Take 1 capsule (100 mg total) by mouth every 8 (eight) hours as needed for cough. 21 capsule 0  . budesonide-formoterol (SYMBICORT) 160-4.5 MCG/ACT inhaler Inhale 2 puffs into the lungs 2 (two) times daily. 1 Inhaler 12  . chlorpheniramine-HYDROcodone (TUSSIONEX) 10-8 MG/5ML SUER Take 5 mLs by mouth every 12 (twelve) hours. 140 mL 0  . Cyanocobalamin (B-12) 1000 MCG CAPS Take 1 capsule by mouth daily.    . Flaxseed, Linseed, (FLAXSEED OIL) 1200 MG CAPS Take 1 capsule by mouth daily.    . Ginkgo Biloba EXTR Take 1 tablet by mouth 2 (two) times daily.    . meloxicam (MOBIC) 15 MG tablet Take 1 tablet (15 mg total) by mouth daily. Use as needed for joint pain 30 tablet 3  . metFORMIN (GLUCOPHAGE) 500 MG tablet Take 250 mg by mouth daily.    . Misc Natural Products (HIMALAYAN GOJI PO) Take 1 tablet by mouth in the morning and at bedtime.     . Multiple Vitamin (MULTIVITAMIN WITH MINERALS) TABS tablet Take 1 tablet by mouth daily.    Marland Kitchen omeprazole (PRILOSEC) 20 MG capsule Take 1 capsule (20 mg total) by mouth 2 (two) times daily. 180 capsule 1  . ondansetron (ZOFRAN) 4 MG tablet Take 1 tablet (4 mg total) by mouth every 8 (eight) hours as needed for nausea or vomiting. 12 tablet 0  . pravastatin (PRAVACHOL) 40 MG tablet Take 0.5 tablets (20 mg total) by mouth daily. Takes 1/2 pill daily 45 tablet 1  . predniSONE (DELTASONE) 10 MG tablet 4 tabs twice daily X 3 days -->4 tabs daily X 3 days-->3 tabs daily X 3 days--> 2 tabs daily X 3 days--> 1 tab daily X 3 days, then STOP. 60 tablet 0  . primidone (MYSOLINE) 50 MG tablet Take 1  tablet (50 mg total) by mouth 2 (two) times daily. 180 tablet 1  . sildenafil (REVATIO) 20 MG tablet Take 0.5 tablets (10 mg total) by mouth daily. Use for Raynaud's symptoms 45 tablet 1  . terazosin (HYTRIN) 1 MG capsule Take 1 capsule (1 mg total) by mouth 2 (two) times daily. 180 capsule 1  . Tetrahydrozoline HCl (VISINE OP) Apply 1 drop to eye daily.     No current facility-administered medications on file prior to visit.    Review of Systems:  As per HPI- otherwise negative.    Physical Examination: There were no vitals filed for this visit. There were no vitals filed for this visit. There is no height or weight on file to calculate BMI. Ideal Body Weight:    Spoke to patient over telephone.  He sounds well, his normal self.  No distress or shortness of breath is noted.  He is not checking any vital signs right now  Assessment and Plan: Hospital discharge follow-up - Plan: Ambulatory referral to [facility] desaturation - Plan: Ambulatory referral to [facility]: Ambulatory referral to [facility]  Telephone visit today to follow-up on recent hospitalization with COVID-19.  Cyris completed antiviral infusion, he is now taking prednisone.  He is doing okay at home but needs assistance with medication and oxygen monitoring.  I have placed an order for home health, have asked him to let me know if I can offer any other assistance.  Patient is to alert me if he starts  getting worse  We scheduled an appointment for a week from now for recheck, can do via telephone or in person if he is able to get out. Spoke with patient for 12 minutes  Signed Lamar Blinks, MD

## 2020-03-03 ENCOUNTER — Telehealth: Payer: Self-pay | Admitting: Family Medicine

## 2020-03-03 NOTE — Telephone Encounter (Signed)
Patient has appt 2/17 with you. Fyi.

## 2020-03-03 NOTE — Telephone Encounter (Signed)
Ok- do you know is home health able to start services or do they require that he sees me first?

## 2020-03-03 NOTE — Telephone Encounter (Signed)
Caller Lattie Haw from [facility] # [phone]   Patient had a referral for home health nursing, per medicare guide line pt needs to make an appointment  Within two weeks and keep appointment if it a virtual visit  it has to include  video or an office he need to be seen in person.  Please advice

## 2020-03-03 NOTE — Telephone Encounter (Signed)
Can you please verify phone number. When I called "Lattie Haw" an older mans voicemail picked up.

## 2020-03-06 ENCOUNTER — Telehealth: Payer: Self-pay | Admitting: Family Medicine

## 2020-03-06 NOTE — Telephone Encounter (Signed)
Patent states he needs help changing his ox tank to portable. Patient was set up after hospital discharge by [facility].

## 2020-03-07 NOTE — Progress Notes (Signed)
[place] at [facility] [address] [place], [place] [address] [phone]  Date:  03/09/2020   Name:  Johnathan [name]   DOB:  28-Jun-1932   MRN:  [id number]  PCP:  Darreld Mclean, MD    Chief Complaint: Follow-up   History of Present Illness:  Johnathan [name] is a 87 y.o. very pleasant male patient who presents with the following:  Following up today-  Admitted with covid 2/4 to 2/8 Telephone visit 2/10-  Telephone visit today to follow-up on recent hospitalization with COVID-19.  Gurtaj completed antiviral infusion, he is now taking prednisone.  He is doing okay at home but needs assistance with medication and oxygen monitoring.  I have placed an order for home health, have asked him to let me know if I can offer any other assistance.  Patient is to alert me if he starts getting worse  Amier is using oxygen now at home - however he does not have a portable oxygenator.  He is using a tank which is not very convenient.  We are not sure if he will be on oxygen long enough to need a portable concentrator.  He does have an oxygen meter which he is using on a regular basis.  Today he brings in data regarding blood pressure, pulse and oxygen level which he has been Nurse came out yesterday to see him-they plan to help him wean his oxygen  He is also getting PT and OT coming up soon He is eating well- his roommate helps him with making meals   He may feel anxious at times but he figures this is normal given recent serious illness He has noted a "stye" on his right upper lid which has been present for a few weeks  Patient Active Problem List   Diagnosis Date Noted  . COVID-19 02/25/2020  . Shingles 11/14/2019  . Raynaud's syndrome 11/14/2019  . Skin cancer 11/14/2019  . Left inguinal hernia s/p lap repair w mesh 05/06/2019 05/06/2019  . Recurrent right inguinal hernia s/p lap repair w mesh 05/06/2019 03/01/2019  . Diabetes mellitus ([place])  03/01/2019  . Pneumonia 04/21/2014  . COPD with acute exacerbation ([place]) 04/21/2014    Past Medical History:  Diagnosis Date  . COPD (chronic obstructive pulmonary disease) ([place])   . Diverticulosis   . DM type 2 (diabetes mellitus, type 2) ([place])   . GERD (gastroesophageal reflux disease)    uses prilosec   . Hypertension   . Pneumonia 2018  . Raynaud's disease   . Shortness of breath dyspnea    WITH EXERTION  . Sinusitis   . Tremors of nervous system     Past Surgical History:  Procedure Laterality Date  . COLONOSCOPY  03/27/2005  . HERNIA REPAIR    . INGUINAL HERNIA REPAIR N/A 05/06/2019   Procedure: LAPAROSCOPIC RIGHT AND LEFT INGUINAL HERNIA(S) REPAIR;  Surgeon: Michael Boston, MD;  Location: [facility];  Service: General;  Laterality: N/A;    Social History   Tobacco Use  . Smoking status: Former Smoker    Packs/day: 1.00    Years: 45.00    Pack years: 45.00    Types: Cigarettes    Quit date: 01/21/1994    Years since quitting: 26.1  . Smokeless tobacco: Never Used  Substance Use Topics  . Alcohol use: Yes    Comment: a drink daily with dinner  . Drug use: No  Family History  Problem Relation Age of Onset  . Dementia Brother   . Diabetes Brother   . Heart attack Father   . Dementia Father     No Known Allergies  Medication list has been reviewed and updated.  Current Outpatient Medications on File Prior to Visit  Medication Sig Dispense Refill  . albuterol (PROVENTIL HFA;VENTOLIN HFA) 108 (90 Base) MCG/ACT inhaler Inhale 2 puffs into the lungs every 4 (four) hours as needed for wheezing or shortness of breath (or coughing). 1 Inhaler 0  . amLODipine (NORVASC) 10 MG tablet Take 1 tablet (10 mg total) by mouth daily. 30 tablet 2  . aspirin EC 81 MG tablet Take 81 mg by mouth daily.    . benzonatate (TESSALON) 100 MG capsule Take 1 capsule (100 mg total) by mouth every 8 (eight) hours as needed for cough. 21 capsule 0  .  budesonide-formoterol (SYMBICORT) 160-4.5 MCG/ACT inhaler Inhale 2 puffs into the lungs 2 (two) times daily. 1 Inhaler 12  . chlorpheniramine-HYDROcodone (TUSSIONEX) 10-8 MG/5ML SUER Take 5 mLs by mouth every 12 (twelve) hours. 140 mL 0  . Cyanocobalamin (B-12) 1000 MCG CAPS Take 1 capsule by mouth daily.    . Flaxseed, Linseed, (FLAXSEED OIL) 1200 MG CAPS Take 1 capsule by mouth daily.    . Ginkgo Biloba EXTR Take 1 tablet by mouth 2 (two) times daily.    . meloxicam (MOBIC) 15 MG tablet Take 1 tablet (15 mg total) by mouth daily. Use as needed for joint pain 30 tablet 3  . metFORMIN (GLUCOPHAGE) 500 MG tablet Take 250 mg by mouth daily.    . Misc Natural Products (HIMALAYAN GOJI PO) Take 1 tablet by mouth in the morning and at bedtime.     . Multiple Vitamin (MULTIVITAMIN WITH MINERALS) TABS tablet Take 1 tablet by mouth daily.    Marland Kitchen omeprazole (PRILOSEC) 20 MG capsule Take 1 capsule (20 mg total) by mouth 2 (two) times daily. 180 capsule 1  . ondansetron (ZOFRAN) 4 MG tablet Take 1 tablet (4 mg total) by mouth every 8 (eight) hours as needed for nausea or vomiting. 12 tablet 0  . pravastatin (PRAVACHOL) 40 MG tablet Take 0.5 tablets (20 mg total) by mouth daily. Takes 1/2 pill daily 45 tablet 1  . predniSONE (DELTASONE) 10 MG tablet 4 tabs twice daily X 3 days -->4 tabs daily X 3 days-->3 tabs daily X 3 days--> 2 tabs daily X 3 days--> 1 tab daily X 3 days, then STOP. 60 tablet 0  . primidone (MYSOLINE) 50 MG tablet Take 1 tablet (50 mg total) by mouth 2 (two) times daily. 180 tablet 1  . sildenafil (REVATIO) 20 MG tablet Take 0.5 tablets (10 mg total) by mouth daily. Use for Raynaud's symptoms 45 tablet 1  . terazosin (HYTRIN) 1 MG capsule Take 1 capsule (1 mg total) by mouth 2 (two) times daily. 180 capsule 1  . Tetrahydrozoline HCl (VISINE OP) Apply 1 drop to eye daily.     No current facility-administered medications on file prior to visit.    Review of Systems:  As per HPI- otherwise  negative.   Physical Examination: Vitals:   03/09/20 1003  BP: (!) 126/50  Pulse: 90  Resp: 17  SpO2: 92%   Vitals:   03/09/20 1003  Weight: 150 lb (68 kg)  Height: 5\' 10"  (1.778 m)   Body mass index is 21.52 kg/m. Ideal Body Weight: Weight in (lb) to have BMI = 25: 173.9  GEN: no acute distress.  Normal weight, looks well HEENT: Atraumatic, Normocephalic.  His right upper lid displays a small skin tag.  Advised patient this can be removed surgically at his convenience if he likes Ears and Nose: No external deformity. CV: RRR, No M/G/R. No JVD. No thrill. No extra heart sounds. PULM: CTA B, no wheezes, crackles, rhonchi. No retractions. No resp. distress. No accessory muscle use. ABD: S, NT, ND, +BS. No rebound. No HSM. EXTR: No c/c/e PSYCH: Normally interactive. Conversant.   I removed oxygen for about 5 minutes.  He maintained saturations 93% or greater on room air while at rest  Wt Readings from Last 3 Encounters:  03/09/20 150 lb (68 kg)  02/25/20 152 lb 5.4 oz (69.1 kg)  11/25/19 157 lb (71.2 kg)    Assessment and Plan: Hospital discharge follow-up  Oxygen desaturation  COVID-19  Patient here today following up from hospital admission, he was admitted February 4 through February 8 with COVID-19.  He is improving.  A few days of prednisone left.  He is still using home oxygen.  I suspect he can start to wean this, he was not on oxygen prior to illness.  We discussed how to try weaning his oxygen, start while at rest and then can try with light exercise.  He has an oxygen saturation meter at home which he is adapt at using  I have asked him to keep me posted about his progress with weaning oxygen.  I asked him to see me in 2 months assuming he is continuing to get better This visit occurred during the SARS-CoV-2 public health emergency.  Safety protocols were in place, including screening questions prior to the visit, additional usage of staff PPE, and extensive  cleaning of exam room while observing appropriate contact time as indicated for disinfecting solutions.    Signed Lamar Blinks, MD

## 2020-03-08 ENCOUNTER — Other Ambulatory Visit: Payer: Self-pay

## 2020-03-08 DIAGNOSIS — I7300 Raynaud's syndrome without gangrene: Secondary | ICD-10-CM | POA: Diagnosis not present

## 2020-03-08 DIAGNOSIS — I10 Essential (primary) hypertension: Secondary | ICD-10-CM | POA: Diagnosis not present

## 2020-03-08 DIAGNOSIS — J1282 Pneumonia due to coronavirus disease 2019: Secondary | ICD-10-CM | POA: Diagnosis not present

## 2020-03-08 DIAGNOSIS — J9601 Acute respiratory failure with hypoxia: Secondary | ICD-10-CM | POA: Diagnosis not present

## 2020-03-08 DIAGNOSIS — E785 Hyperlipidemia, unspecified: Secondary | ICD-10-CM | POA: Diagnosis not present

## 2020-03-08 DIAGNOSIS — E1151 Type 2 diabetes mellitus with diabetic peripheral angiopathy without gangrene: Secondary | ICD-10-CM | POA: Diagnosis not present

## 2020-03-08 DIAGNOSIS — U071 COVID-19: Secondary | ICD-10-CM | POA: Diagnosis not present

## 2020-03-08 DIAGNOSIS — J440 Chronic obstructive pulmonary disease with acute lower respiratory infection: Secondary | ICD-10-CM | POA: Diagnosis not present

## 2020-03-08 DIAGNOSIS — J441 Chronic obstructive pulmonary disease with (acute) exacerbation: Secondary | ICD-10-CM | POA: Diagnosis not present

## 2020-03-08 NOTE — Patient Outreach (Signed)
[facility]) Care Management  03/08/2020  ORESTES GEIMAN 22-Jun-1932 [id number]   Telephone Screen  Referral Date: 03/07/2020 Referral Source: Self Referral Referral Reason: "pt called thinking [facility] was HH agency-unsure how to use portable oxygen tank" Insurance: [employer]   Outreach attempt # 1 to patient. Spoke with patient who repots he is doing fairly well. He denies any acute issues or concerns at present. RN CM discussed call to [facility] office yesterday from patient. He voices that the situation is getting fixed. He was contacted by [facility] agency and someone is coming out this afternoon to visit him and go over with him how to use portable oxygen. He reports that is his only issues/concern at present. He denies any [facility] needs or concerns as RN CM advised patient that we were unable to make home visits and are a telephonic service. He states that he thinks he will be find once [facility] comes today. He was advised to call back if needed and voiced understanding.       Plan: RN CM will close referral at this time.    Enzo Montgomery, RN,BSN,CCM [facility] Management Telephonic Care Management Coordinator Direct Phone: [phone] Toll Free: [phone] Fax: [phone]

## 2020-03-09 ENCOUNTER — Other Ambulatory Visit: Payer: Self-pay

## 2020-03-09 ENCOUNTER — Encounter: Payer: Self-pay | Admitting: Family Medicine

## 2020-03-09 ENCOUNTER — Telehealth: Payer: Self-pay | Admitting: Family Medicine

## 2020-03-09 ENCOUNTER — Ambulatory Visit (INDEPENDENT_AMBULATORY_CARE_PROVIDER_SITE_OTHER): Payer: Medicare HMO | Admitting: Family Medicine

## 2020-03-09 VITALS — BP 126/50 | HR 90 | Resp 17 | Ht 70.0 in | Wt 150.0 lb

## 2020-03-09 DIAGNOSIS — U071 COVID-19: Secondary | ICD-10-CM | POA: Diagnosis not present

## 2020-03-09 DIAGNOSIS — Z09 Encounter for follow-up examination after completed treatment for conditions other than malignant neoplasm: Secondary | ICD-10-CM

## 2020-03-09 DIAGNOSIS — R0902 Hypoxemia: Secondary | ICD-10-CM | POA: Diagnosis not present

## 2020-03-09 NOTE — Telephone Encounter (Signed)
Spoke w/ Merry Proud- verbals given.

## 2020-03-09 NOTE — Patient Instructions (Addendum)
It was good to see you today- you are going in the right direction  The thing on your right eyelid is a skin tag- this can be removed surgically at a later date Your oxygen level stayed ok off the oxygen today.  If you like, you can start trying to turn your oxygen down to 1 liter when you are at rest.  If your oxygen does not drop under 92/93% you can try taking it off while you are at rest.  Continue to use while you are active and sleeping for now. As you improve we can cut your oxygen with exercise and sleep as well   Please keep me posted about how you are doing.  Let's plan to visit in 2 months- sooner if you are not steadily making progress

## 2020-03-09 NOTE — Telephone Encounter (Signed)
Caller: Merry Proud ([place] Call back number: [phone]  Verbal order   Nursing -copd PT, OT Evaluate and treat Social worker for community resources

## 2020-03-13 ENCOUNTER — Telehealth: Payer: Self-pay | Admitting: Family Medicine

## 2020-03-13 DIAGNOSIS — H3509 Other intraretinal microvascular abnormalities: Secondary | ICD-10-CM | POA: Diagnosis not present

## 2020-03-13 DIAGNOSIS — D3132 Benign neoplasm of left choroid: Secondary | ICD-10-CM | POA: Diagnosis not present

## 2020-03-13 DIAGNOSIS — H25813 Combined forms of age-related cataract, bilateral: Secondary | ICD-10-CM | POA: Diagnosis not present

## 2020-03-13 DIAGNOSIS — H0102B Squamous blepharitis left eye, upper and lower eyelids: Secondary | ICD-10-CM | POA: Diagnosis not present

## 2020-03-13 DIAGNOSIS — H0102A Squamous blepharitis right eye, upper and lower eyelids: Secondary | ICD-10-CM | POA: Diagnosis not present

## 2020-03-13 NOTE — Telephone Encounter (Signed)
Patient would like a call back from nurse or Dr.Copland he wants to know is there an protocol he needs to  follow after his stay at the hospital  Please advice

## 2020-03-13 NOTE — Telephone Encounter (Signed)
I am not quite sure what he is asking-would you mind giving him a call?

## 2020-03-13 NOTE — Telephone Encounter (Signed)
I can call and speak with patient-could you advise if there is anything specific to inform patient

## 2020-03-14 ENCOUNTER — Telehealth: Payer: Self-pay | Admitting: Family Medicine

## 2020-03-14 DIAGNOSIS — U071 COVID-19: Secondary | ICD-10-CM | POA: Diagnosis not present

## 2020-03-14 DIAGNOSIS — E1151 Type 2 diabetes mellitus with diabetic peripheral angiopathy without gangrene: Secondary | ICD-10-CM | POA: Diagnosis not present

## 2020-03-14 DIAGNOSIS — E785 Hyperlipidemia, unspecified: Secondary | ICD-10-CM | POA: Diagnosis not present

## 2020-03-14 DIAGNOSIS — J441 Chronic obstructive pulmonary disease with (acute) exacerbation: Secondary | ICD-10-CM | POA: Diagnosis not present

## 2020-03-14 DIAGNOSIS — J440 Chronic obstructive pulmonary disease with acute lower respiratory infection: Secondary | ICD-10-CM | POA: Diagnosis not present

## 2020-03-14 DIAGNOSIS — J1282 Pneumonia due to coronavirus disease 2019: Secondary | ICD-10-CM | POA: Diagnosis not present

## 2020-03-14 DIAGNOSIS — I10 Essential (primary) hypertension: Secondary | ICD-10-CM | POA: Diagnosis not present

## 2020-03-14 DIAGNOSIS — I7300 Raynaud's syndrome without gangrene: Secondary | ICD-10-CM | POA: Diagnosis not present

## 2020-03-14 DIAGNOSIS — J9601 Acute respiratory failure with hypoxia: Secondary | ICD-10-CM | POA: Diagnosis not present

## 2020-03-14 NOTE — Telephone Encounter (Signed)
Left message to return call 

## 2020-03-14 NOTE — Telephone Encounter (Signed)
Caller : [facility]  Call Back @ [phone]  Terri Piedra is calling for verbal orders for Home Health care and PT   Fqy : 2x4

## 2020-03-14 NOTE — Telephone Encounter (Signed)
Verbal orders given to Gracie.

## 2020-03-15 DIAGNOSIS — J9601 Acute respiratory failure with hypoxia: Secondary | ICD-10-CM | POA: Diagnosis not present

## 2020-03-15 DIAGNOSIS — J440 Chronic obstructive pulmonary disease with acute lower respiratory infection: Secondary | ICD-10-CM | POA: Diagnosis not present

## 2020-03-15 DIAGNOSIS — U071 COVID-19: Secondary | ICD-10-CM | POA: Diagnosis not present

## 2020-03-15 DIAGNOSIS — E785 Hyperlipidemia, unspecified: Secondary | ICD-10-CM | POA: Diagnosis not present

## 2020-03-15 DIAGNOSIS — E1151 Type 2 diabetes mellitus with diabetic peripheral angiopathy without gangrene: Secondary | ICD-10-CM | POA: Diagnosis not present

## 2020-03-15 DIAGNOSIS — I7300 Raynaud's syndrome without gangrene: Secondary | ICD-10-CM | POA: Diagnosis not present

## 2020-03-15 DIAGNOSIS — J441 Chronic obstructive pulmonary disease with (acute) exacerbation: Secondary | ICD-10-CM | POA: Diagnosis not present

## 2020-03-15 DIAGNOSIS — J1282 Pneumonia due to coronavirus disease 2019: Secondary | ICD-10-CM | POA: Diagnosis not present

## 2020-03-15 DIAGNOSIS — I10 Essential (primary) hypertension: Secondary | ICD-10-CM | POA: Diagnosis not present

## 2020-03-16 ENCOUNTER — Telehealth: Payer: Self-pay | Admitting: Family Medicine

## 2020-03-16 DIAGNOSIS — E785 Hyperlipidemia, unspecified: Secondary | ICD-10-CM | POA: Diagnosis not present

## 2020-03-16 DIAGNOSIS — J441 Chronic obstructive pulmonary disease with (acute) exacerbation: Secondary | ICD-10-CM | POA: Diagnosis not present

## 2020-03-16 DIAGNOSIS — E1151 Type 2 diabetes mellitus with diabetic peripheral angiopathy without gangrene: Secondary | ICD-10-CM | POA: Diagnosis not present

## 2020-03-16 DIAGNOSIS — I10 Essential (primary) hypertension: Secondary | ICD-10-CM | POA: Diagnosis not present

## 2020-03-16 DIAGNOSIS — I7300 Raynaud's syndrome without gangrene: Secondary | ICD-10-CM | POA: Diagnosis not present

## 2020-03-16 DIAGNOSIS — J440 Chronic obstructive pulmonary disease with acute lower respiratory infection: Secondary | ICD-10-CM | POA: Diagnosis not present

## 2020-03-16 DIAGNOSIS — U071 COVID-19: Secondary | ICD-10-CM | POA: Diagnosis not present

## 2020-03-16 DIAGNOSIS — J1282 Pneumonia due to coronavirus disease 2019: Secondary | ICD-10-CM | POA: Diagnosis not present

## 2020-03-16 DIAGNOSIS — J9601 Acute respiratory failure with hypoxia: Secondary | ICD-10-CM | POA: Diagnosis not present

## 2020-03-16 NOTE — Telephone Encounter (Signed)
Patient is wondering how can he get a small oxygen tank so he could be mobile.

## 2020-03-17 ENCOUNTER — Telehealth: Payer: Self-pay | Admitting: Family Medicine

## 2020-03-17 DIAGNOSIS — I7300 Raynaud's syndrome without gangrene: Secondary | ICD-10-CM | POA: Diagnosis not present

## 2020-03-17 DIAGNOSIS — E785 Hyperlipidemia, unspecified: Secondary | ICD-10-CM | POA: Diagnosis not present

## 2020-03-17 DIAGNOSIS — U071 COVID-19: Secondary | ICD-10-CM | POA: Diagnosis not present

## 2020-03-17 DIAGNOSIS — J441 Chronic obstructive pulmonary disease with (acute) exacerbation: Secondary | ICD-10-CM | POA: Diagnosis not present

## 2020-03-17 DIAGNOSIS — J9601 Acute respiratory failure with hypoxia: Secondary | ICD-10-CM | POA: Diagnosis not present

## 2020-03-17 DIAGNOSIS — I10 Essential (primary) hypertension: Secondary | ICD-10-CM | POA: Diagnosis not present

## 2020-03-17 DIAGNOSIS — E1151 Type 2 diabetes mellitus with diabetic peripheral angiopathy without gangrene: Secondary | ICD-10-CM | POA: Diagnosis not present

## 2020-03-17 DIAGNOSIS — J1282 Pneumonia due to coronavirus disease 2019: Secondary | ICD-10-CM | POA: Diagnosis not present

## 2020-03-17 DIAGNOSIS — J440 Chronic obstructive pulmonary disease with acute lower respiratory infection: Secondary | ICD-10-CM | POA: Diagnosis not present

## 2020-03-17 NOTE — Telephone Encounter (Signed)
Verbal order given via detailed message.

## 2020-03-17 NOTE — Telephone Encounter (Signed)
Called [employer] [facility], spoke with rep.She expressed to me their system was down/frozen at the moment so she would call me back as soon as its up.

## 2020-03-17 NOTE — Telephone Encounter (Signed)
CallerHarmon [name] -Speech Therapy  Call Back @ [phone]  Johnathan [name] is requesting a verbal order speech therapy.   frequency 1x2

## 2020-03-20 ENCOUNTER — Telehealth: Payer: Self-pay | Admitting: *Deleted

## 2020-03-20 NOTE — Telephone Encounter (Signed)
Jerome from [facility] needed a statement that patient was on oxygen and also Dr. Serita Grit NPI number.   Statement written and will fax to [phone]

## 2020-03-20 NOTE — Telephone Encounter (Signed)
Statement faxed over

## 2020-03-21 ENCOUNTER — Encounter: Payer: Self-pay | Admitting: Family Medicine

## 2020-03-21 DIAGNOSIS — U071 COVID-19: Secondary | ICD-10-CM | POA: Diagnosis not present

## 2020-03-21 DIAGNOSIS — J440 Chronic obstructive pulmonary disease with acute lower respiratory infection: Secondary | ICD-10-CM | POA: Diagnosis not present

## 2020-03-21 DIAGNOSIS — J9601 Acute respiratory failure with hypoxia: Secondary | ICD-10-CM | POA: Diagnosis not present

## 2020-03-21 DIAGNOSIS — J441 Chronic obstructive pulmonary disease with (acute) exacerbation: Secondary | ICD-10-CM | POA: Diagnosis not present

## 2020-03-21 DIAGNOSIS — J1282 Pneumonia due to coronavirus disease 2019: Secondary | ICD-10-CM | POA: Diagnosis not present

## 2020-03-21 DIAGNOSIS — I10 Essential (primary) hypertension: Secondary | ICD-10-CM | POA: Diagnosis not present

## 2020-03-21 DIAGNOSIS — I7300 Raynaud's syndrome without gangrene: Secondary | ICD-10-CM | POA: Diagnosis not present

## 2020-03-21 DIAGNOSIS — E785 Hyperlipidemia, unspecified: Secondary | ICD-10-CM | POA: Diagnosis not present

## 2020-03-21 DIAGNOSIS — E1151 Type 2 diabetes mellitus with diabetic peripheral angiopathy without gangrene: Secondary | ICD-10-CM | POA: Diagnosis not present

## 2020-03-21 NOTE — Telephone Encounter (Signed)
I have sent order form as requested by [employer] to their fax number. [phone]. The fax sent included the order for the portable tank, demographics, and OV notes.    I advised the patient that we are working on this and to call me back if they do not call him by the end of the week.

## 2020-03-22 ENCOUNTER — Telehealth: Payer: Self-pay | Admitting: Family Medicine

## 2020-03-22 DIAGNOSIS — J9601 Acute respiratory failure with hypoxia: Secondary | ICD-10-CM | POA: Diagnosis not present

## 2020-03-22 DIAGNOSIS — U071 COVID-19: Secondary | ICD-10-CM | POA: Diagnosis not present

## 2020-03-22 DIAGNOSIS — J440 Chronic obstructive pulmonary disease with acute lower respiratory infection: Secondary | ICD-10-CM | POA: Diagnosis not present

## 2020-03-22 DIAGNOSIS — I10 Essential (primary) hypertension: Secondary | ICD-10-CM | POA: Diagnosis not present

## 2020-03-22 DIAGNOSIS — E785 Hyperlipidemia, unspecified: Secondary | ICD-10-CM | POA: Diagnosis not present

## 2020-03-22 DIAGNOSIS — E1151 Type 2 diabetes mellitus with diabetic peripheral angiopathy without gangrene: Secondary | ICD-10-CM | POA: Diagnosis not present

## 2020-03-22 DIAGNOSIS — J1282 Pneumonia due to coronavirus disease 2019: Secondary | ICD-10-CM | POA: Diagnosis not present

## 2020-03-22 DIAGNOSIS — J441 Chronic obstructive pulmonary disease with (acute) exacerbation: Secondary | ICD-10-CM | POA: Diagnosis not present

## 2020-03-22 DIAGNOSIS — I7300 Raynaud's syndrome without gangrene: Secondary | ICD-10-CM | POA: Diagnosis not present

## 2020-03-22 NOTE — Telephone Encounter (Signed)
Faxed order to [employer].

## 2020-03-22 NOTE — Telephone Encounter (Signed)
I haved faxed order to [employer] [facility]. See previous encounter.

## 2020-03-22 NOTE — Telephone Encounter (Signed)
Patient called in requesting for an order for a Portable Oxygen Concentrator. I called [facility]. She states patient is within his 30 days from his original start and is able to change out. All she needs is a POC order faxed over to her  [phone]

## 2020-03-22 NOTE — Telephone Encounter (Signed)
Patient is requesting liquid oxygen in the portal bags.   Please Advise

## 2020-03-22 NOTE — Telephone Encounter (Signed)
Caller : [facility] Katharine Look)  Call # [phone]   Patient states that [facility]  reached out to him about oxygen tanks. Patient states those tanks are to big(sliver tanks). Patient wants something he can tote around.   Please Advise

## 2020-03-23 DIAGNOSIS — I10 Essential (primary) hypertension: Secondary | ICD-10-CM | POA: Diagnosis not present

## 2020-03-23 DIAGNOSIS — J440 Chronic obstructive pulmonary disease with acute lower respiratory infection: Secondary | ICD-10-CM | POA: Diagnosis not present

## 2020-03-23 DIAGNOSIS — I7300 Raynaud's syndrome without gangrene: Secondary | ICD-10-CM | POA: Diagnosis not present

## 2020-03-23 DIAGNOSIS — E1151 Type 2 diabetes mellitus with diabetic peripheral angiopathy without gangrene: Secondary | ICD-10-CM | POA: Diagnosis not present

## 2020-03-23 DIAGNOSIS — J441 Chronic obstructive pulmonary disease with (acute) exacerbation: Secondary | ICD-10-CM | POA: Diagnosis not present

## 2020-03-23 DIAGNOSIS — J9601 Acute respiratory failure with hypoxia: Secondary | ICD-10-CM | POA: Diagnosis not present

## 2020-03-23 DIAGNOSIS — U071 COVID-19: Secondary | ICD-10-CM | POA: Diagnosis not present

## 2020-03-23 DIAGNOSIS — E785 Hyperlipidemia, unspecified: Secondary | ICD-10-CM | POA: Diagnosis not present

## 2020-03-23 DIAGNOSIS — J1282 Pneumonia due to coronavirus disease 2019: Secondary | ICD-10-CM | POA: Diagnosis not present

## 2020-03-24 ENCOUNTER — Telehealth: Payer: Self-pay | Admitting: Family Medicine

## 2020-03-24 ENCOUNTER — Other Ambulatory Visit: Payer: Self-pay

## 2020-03-24 DIAGNOSIS — E1151 Type 2 diabetes mellitus with diabetic peripheral angiopathy without gangrene: Secondary | ICD-10-CM | POA: Diagnosis not present

## 2020-03-24 DIAGNOSIS — E785 Hyperlipidemia, unspecified: Secondary | ICD-10-CM | POA: Diagnosis not present

## 2020-03-24 DIAGNOSIS — J9601 Acute respiratory failure with hypoxia: Secondary | ICD-10-CM | POA: Diagnosis not present

## 2020-03-24 DIAGNOSIS — I7300 Raynaud's syndrome without gangrene: Secondary | ICD-10-CM | POA: Diagnosis not present

## 2020-03-24 DIAGNOSIS — J1282 Pneumonia due to coronavirus disease 2019: Secondary | ICD-10-CM | POA: Diagnosis not present

## 2020-03-24 DIAGNOSIS — Z8616 Personal history of COVID-19: Secondary | ICD-10-CM

## 2020-03-24 DIAGNOSIS — I10 Essential (primary) hypertension: Secondary | ICD-10-CM | POA: Diagnosis not present

## 2020-03-24 DIAGNOSIS — U071 COVID-19: Secondary | ICD-10-CM | POA: Diagnosis not present

## 2020-03-24 DIAGNOSIS — J441 Chronic obstructive pulmonary disease with (acute) exacerbation: Secondary | ICD-10-CM | POA: Diagnosis not present

## 2020-03-24 DIAGNOSIS — J440 Chronic obstructive pulmonary disease with acute lower respiratory infection: Secondary | ICD-10-CM | POA: Diagnosis not present

## 2020-03-24 DIAGNOSIS — R0902 Hypoxemia: Secondary | ICD-10-CM

## 2020-03-24 NOTE — Addendum Note (Signed)
Addended by: Wynonia Musty A on: 03/24/2020 04:22 PM   Modules accepted: Orders

## 2020-03-24 NOTE — Telephone Encounter (Signed)
Patient is calling [facility] to oxygen concentrator, patient has not heard anything about it yet.  Patient would like a return call

## 2020-03-24 NOTE — Telephone Encounter (Signed)
See community message for DME order. Johnathan [name] with [employer] states since his set up is less than 66 days old they will swap for POC and insurance should cover. They will let the patient know   I also contacted [name] to let him know the update and he was very appreciative and will watch for call.

## 2020-03-24 NOTE — Telephone Encounter (Signed)
Caller Johnathan [name],[name] from [employer] # [phone]  Patient is requesting  a Portable  Oxygen Tank

## 2020-03-27 DIAGNOSIS — J1282 Pneumonia due to coronavirus disease 2019: Secondary | ICD-10-CM | POA: Diagnosis not present

## 2020-03-27 DIAGNOSIS — J9601 Acute respiratory failure with hypoxia: Secondary | ICD-10-CM | POA: Diagnosis not present

## 2020-03-27 DIAGNOSIS — J441 Chronic obstructive pulmonary disease with (acute) exacerbation: Secondary | ICD-10-CM | POA: Diagnosis not present

## 2020-03-27 DIAGNOSIS — I7300 Raynaud's syndrome without gangrene: Secondary | ICD-10-CM | POA: Diagnosis not present

## 2020-03-27 DIAGNOSIS — J440 Chronic obstructive pulmonary disease with acute lower respiratory infection: Secondary | ICD-10-CM | POA: Diagnosis not present

## 2020-03-27 DIAGNOSIS — E785 Hyperlipidemia, unspecified: Secondary | ICD-10-CM | POA: Diagnosis not present

## 2020-03-27 DIAGNOSIS — I10 Essential (primary) hypertension: Secondary | ICD-10-CM | POA: Diagnosis not present

## 2020-03-27 DIAGNOSIS — E1151 Type 2 diabetes mellitus with diabetic peripheral angiopathy without gangrene: Secondary | ICD-10-CM | POA: Diagnosis not present

## 2020-03-27 DIAGNOSIS — U071 COVID-19: Secondary | ICD-10-CM | POA: Diagnosis not present

## 2020-03-27 NOTE — Telephone Encounter (Signed)
Patient is calling in reference to portable oxygen tank, Patient thinks he may need to see a Pulmonologist. Patient states he would like a call back.

## 2020-03-27 NOTE — Telephone Encounter (Signed)
Patient is checking the status of oxygen tank

## 2020-03-27 NOTE — Telephone Encounter (Signed)
Spoke with patient, he states they called him over the weekend ([employer]) and they are set to deliver this morning!

## 2020-03-28 NOTE — Telephone Encounter (Signed)
Patient states they ([employer]) try to deliver yesterday what it was the same equipment he has. Patient would like to know what does he need do in order to get a portable tank.

## 2020-03-28 NOTE — Telephone Encounter (Signed)
Called [employer] [facility] in [facility] and spoke with representative   Need to send an rx for portable oxygen concentrator- fax to [phone] Include Office visit note, demo, rx  Pt notes his oxygen is still in the 80s when off oxygen even now- he has been out of the hospital for 4 weeks Advised him that we are working on [employer] but this has been somewhat difficult to get approved.  I made him an appointment for Thursday, if we can document continued oxygen desaturations that may be helpful

## 2020-03-29 NOTE — Patient Instructions (Addendum)
It was good to see you again today! I am sorry that you are still so ill We will check your thyroid level and other routine labs today, and get a chest film Assuming no surprises I would like to have you use prednisone again for your breathing- 40 mg daily for 5 days and then 20 mg daily for 5 days   If your chest x-ray appears to be worsening we will refer you to pulmonology If you are not doing ok please seek help right away!

## 2020-03-29 NOTE — Progress Notes (Addendum)
[facility] [address], [place], [place] [address] [phone]  Date:  03/30/2020   Name:  Johnathan [name]   DOB:  1932/02/19   MRN:  [id number]  PCP:  Darreld Mclean, MD    Chief Complaint: Follow-up (Low oxygen)   History of Present Illness:  Johnathan [name] is a 87 y.o. very pleasant male patient who presents with the following:  Older gentleman here today for follow-up and discussion of oxygen use History of well-controlled diabetes, COPD Last seen by myself February 17 He had been admitted from February 4-8 with COVID-19 requiring antiviral infusion, sent home with oxygen  We had presumed he will be on oxygen just temporarily and would not need a portable concentrator.  However, patient notes he still has desaturations to the high 70s and 80s when he removes his home oxygen- he would like a portable to allow him more freedom outside of the home He does not currently see a pulmonologist Could use recheck TSH  He drove himself here today and walked in from the parking lot By the time he made it to the door he was feeling really tired He was brought up in [facility] from the lobby as he was looking tired  PT came in to see him earlier this week-they feel he was doing okay  Johnathan [name] feels like he is overall making progress, but is frustrated by the fact that he still needs oxygen.  He notes he will get quite short of breath if he tries to be active without his oxygen, and sometimes coughs quite a bit No fever    Lab Results  Component Value Date   HGBA1C 6.5 (H) 02/25/2020   Lab Results  Component Value Date   TSH 3.130 01/27/2019    Patient Active Problem List   Diagnosis Date Noted  . COVID-19 02/25/2020  . Shingles 11/14/2019  . Raynaud's syndrome 11/14/2019  . Skin cancer 11/14/2019  . Left inguinal hernia s/p lap repair w mesh 05/06/2019 05/06/2019  . Recurrent right inguinal hernia s/p lap repair w mesh  05/06/2019 03/01/2019  . Diabetes mellitus ([place]) 03/01/2019  . Pneumonia 04/21/2014  . COPD with acute exacerbation ([place]) 04/21/2014    Past Medical History:  Diagnosis Date  . COPD (chronic obstructive pulmonary disease) ([place])   . Diverticulosis   . DM type 2 (diabetes mellitus, type 2) ([place])   . GERD (gastroesophageal reflux disease)    uses prilosec   . Hypertension   . Pneumonia 2018  . Raynaud's disease   . Shortness of breath dyspnea    WITH EXERTION  . Sinusitis   . Tremors of nervous system     Past Surgical History:  Procedure Laterality Date  . COLONOSCOPY  03/27/2005  . HERNIA REPAIR    . INGUINAL HERNIA REPAIR N/A 05/06/2019   Procedure: LAPAROSCOPIC RIGHT AND LEFT INGUINAL HERNIA(S) REPAIR;  Surgeon: Michael Boston, MD;  Location: [facility];  Service: General;  Laterality: N/A;    Social History   Tobacco Use  . Smoking status: Former Smoker    Packs/day: 1.00    Years: 45.00    Pack years: 45.00    Types: Cigarettes    Quit date: 01/21/1994    Years since quitting: 26.2  . Smokeless tobacco: Never Used  Substance Use Topics  . Alcohol use: Yes    Comment: a drink daily with dinner  . Drug use:  No    Family History  Problem Relation Age of Onset  . Dementia Brother   . Diabetes Brother   . Heart attack Father   . Dementia Father     No Known Allergies  Medication list has been reviewed and updated.  Current Outpatient Medications on File Prior to Visit  Medication Sig Dispense Refill  . albuterol (PROVENTIL HFA;VENTOLIN HFA) 108 (90 Base) MCG/ACT inhaler Inhale 2 puffs into the lungs every 4 (four) hours as needed for wheezing or shortness of breath (or coughing). 1 Inhaler 0  . amLODipine (NORVASC) 10 MG tablet Take 1 tablet (10 mg total) by mouth daily. 30 tablet 2  . aspirin EC 81 MG tablet Take 81 mg by mouth daily.    . benzonatate (TESSALON) 100 MG capsule Take 1 capsule (100 mg total) by mouth every 8 (eight) hours as  needed for cough. 21 capsule 0  . budesonide-formoterol (SYMBICORT) 160-4.5 MCG/ACT inhaler Inhale 2 puffs into the lungs 2 (two) times daily. 1 Inhaler 12  . chlorpheniramine-HYDROcodone (TUSSIONEX) 10-8 MG/5ML SUER Take 5 mLs by mouth every 12 (twelve) hours. 140 mL 0  . Cyanocobalamin (B-12) 1000 MCG CAPS Take 1 capsule by mouth daily.    . Flaxseed, Linseed, (FLAXSEED OIL) 1200 MG CAPS Take 1 capsule by mouth daily.    . Ginkgo Biloba EXTR Take 1 tablet by mouth 2 (two) times daily.    . meloxicam (MOBIC) 15 MG tablet Take 1 tablet (15 mg total) by mouth daily. Use as needed for joint pain 30 tablet 3  . metFORMIN (GLUCOPHAGE) 500 MG tablet Take 250 mg by mouth daily.    . Misc Natural Products (HIMALAYAN GOJI PO) Take 1 tablet by mouth in the morning and at bedtime.     . Multiple Vitamin (MULTIVITAMIN WITH MINERALS) TABS tablet Take 1 tablet by mouth daily.    Marland Kitchen omeprazole (PRILOSEC) 20 MG capsule Take 1 capsule (20 mg total) by mouth 2 (two) times daily. 180 capsule 1  . ondansetron (ZOFRAN) 4 MG tablet Take 1 tablet (4 mg total) by mouth every 8 (eight) hours as needed for nausea or vomiting. 12 tablet 0  . pravastatin (PRAVACHOL) 40 MG tablet Take 0.5 tablets (20 mg total) by mouth daily. Takes 1/2 pill daily 45 tablet 1  . primidone (MYSOLINE) 50 MG tablet Take 1 tablet (50 mg total) by mouth 2 (two) times daily. 180 tablet 1  . sildenafil (REVATIO) 20 MG tablet Take 0.5 tablets (10 mg total) by mouth daily. Use for Raynaud's symptoms 45 tablet 1  . terazosin (HYTRIN) 1 MG capsule Take 1 capsule (1 mg total) by mouth 2 (two) times daily. 180 capsule 1  . Tetrahydrozoline HCl (VISINE OP) Apply 1 drop to eye daily.     No current facility-administered medications on file prior to visit.    Review of Systems:  As per HPI- otherwise negative.   Physical Examination: Vitals:   03/30/20 1405  BP: 128/70  Pulse: 94  Resp: 17  Temp: 97.6 F (36.4 C)  SpO2: 97%   Vitals:    03/30/20 1405  Weight: 150 lb (68 kg)  Height: 5\' 10"  (1.778 m)   Body mass index is 21.52 kg/m. Ideal Body Weight: Weight in (lb) to have BMI = 25: 173.9  GEN: no acute distress.  Looks well and nicely groomed and dressed HEENT: Atraumatic, Normocephalic.  Ears and Nose: No external deformity. CV: RRR, No M/G/R. No JVD. No thrill. No extra heart  sounds. PULM: CTA B, no wheezes, crackles, rhonchi. No retractions. No resp. distress. No accessory muscle use. ABD: S, NT, ND EXTR: No c/c/e PSYCH: Normally interactive. Conversant.  Patient came in wearing oxygen at 4 L via nasal cannula We removed his oxygen.  Sat stayed at 97% while seated for over 5 minutes but went to 90% with standing up for about 2 minutes, 88% after sitting down again with no oxygen  Oxygen was replaced at 2 L, saturation still at 90, increase to 3 L   Assessment and Plan: Oxygen desaturation - Plan: CBC, Ambulatory referral to Pulmonology  Type 2 diabetes mellitus without complication, without long-term current use of insulin ([place]) - Plan: Comprehensive metabolic panel, CBC  Hypothyroidism (acquired) - Plan: TSH  History of COVID-19 - Plan: DG Chest 2 View, predniSONE (DELTASONE) 20 MG tablet, Ambulatory referral to Pulmonology  Patient here today with concern of COVID-19 about 1 month ago, continued oxygen need.  The patient had never been on oxygen prior to having COVID-19.  He is doing okay with his oxygen, but we are concerned that he still requires it this far after hospital discharge.  We are working on getting him a home concentrator to aid in mobility  Decided to repeat chest film today.  We will also obtain basic labs  Depending on results of chest x-ray we may refer to pulmonology.  We will also start on prednisone in hopes of improving his lung inflammation This visit occurred during the SARS-CoV-2 public health emergency.  Safety protocols were in place, including screening questions prior to the  visit, additional usage of staff PPE, and extensive cleaning of exam room while observing appropriate contact time as indicated for disinfecting solutions.   Pulmonology referral if CXR worse  Signed Lamar Blinks, MD   Received his chest x-ray as follows, called patient.  Unfortunately chest x-ray looks worse than 1 month ago.  From my reading, it appears that steroids may be helpful, we may need to keep him on a longer course than originally planned-perhaps up to 3 weeks.  We will have him start on 40 mg of prednisone daily now, he agrees to keep me closely posted about his symptoms.  If he starts getting worse he may need to be readmitted.  Go to the ER if any distress. I will also place a referral to pulmonology at this time  DG Chest 2 View  Result Date: 03/30/2020 CLINICAL DATA:  Follow-up COVID 1 month ago.  Cough and congestion. EXAM: CHEST - 2 VIEW COMPARISON:  Radiograph 02/25/2020 FINDINGS: Patchy heterogeneous bilateral lung opacities have progressed since prior exam. New and increased opacities in the periphery of the right mid to lower lung zone. Left basilar opacity is increased. Normal heart size and mediastinal contours. Aortic atherosclerosis. No evidence of pneumomediastinum or pneumothorax. No pleural effusion. Hyperinflation and underlying emphysema. No acute osseous abnormalities are seen. IMPRESSION: Bilateral patchy heterogeneous lung opacities consistent with history of COVID-19 pneumonia. Radiographic findings have progressed from prior exam. Electronically Signed   By: Keith Rake M.D.   On: 03/30/2020 16:17    Received labs as below 3/11- message to pt  Results for orders placed or performed in visit on 03/30/20  Comprehensive metabolic panel  Result Value Ref Range   Sodium 136 135 - 145 mEq/L   Potassium 4.1 3.5 - 5.1 mEq/L   Chloride 98 96 - 112 mEq/L   CO2 30 19 - 32 mEq/L   Glucose, Bld 142 (H) 70 -  99 mg/dL   BUN 16 6 - 23 mg/dL   Creatinine, Ser  1.01 0.40 - 1.50 mg/dL   Total Bilirubin 0.4 0.2 - 1.2 mg/dL   Alkaline Phosphatase 71 39 - 117 U/L   AST 16 0 - 37 U/L   ALT 16 0 - 53 U/L   Total Protein 7.1 6.0 - 8.3 g/dL   Albumin 3.4 (L) 3.5 - 5.2 g/dL   GFR 67.83 >60.00 mL/min   Calcium 9.0 8.4 - 10.5 mg/dL  TSH  Result Value Ref Range   TSH 2.99 0.35 - 4.50 uIU/mL  CBC  Result Value Ref Range   WBC 6.5 4.0 - 10.5 K/uL   RBC 3.95 (L) 4.22 - 5.81 Mil/uL   Platelets 212.0 150.0 - 400.0 K/uL   Hemoglobin 12.7 (L) 13.0 - 17.0 g/dL   HCT 37.7 (L) 39.0 - 52.0 %   MCV 95.3 78.0 - 100.0 fl   MCHC 33.8 30.0 - 36.0 g/dL   RDW 14.2 11.5 - 15.5 %

## 2020-03-30 ENCOUNTER — Encounter: Payer: Self-pay | Admitting: Family Medicine

## 2020-03-30 ENCOUNTER — Ambulatory Visit (HOSPITAL_BASED_OUTPATIENT_CLINIC_OR_DEPARTMENT_OTHER)
Admission: RE | Admit: 2020-03-30 | Discharge: 2020-03-30 | Disposition: A | Discharge status: Home / Self Care | Payer: Medicare HMO | Source: Ambulatory Visit | Attending: Family Medicine | Admitting: Family Medicine

## 2020-03-30 ENCOUNTER — Other Ambulatory Visit (HOSPITAL_COMMUNITY): Payer: Self-pay | Admitting: Family Medicine

## 2020-03-30 ENCOUNTER — Ambulatory Visit (INDEPENDENT_AMBULATORY_CARE_PROVIDER_SITE_OTHER): Payer: Medicare HMO | Admitting: Family Medicine

## 2020-03-30 ENCOUNTER — Other Ambulatory Visit: Payer: Self-pay

## 2020-03-30 VITALS — BP 128/70 | HR 94 | Temp 97.6°F | Resp 17 | Ht 70.0 in | Wt 150.0 lb

## 2020-03-30 DIAGNOSIS — E039 Hypothyroidism, unspecified: Secondary | ICD-10-CM

## 2020-03-30 DIAGNOSIS — Z8616 Personal history of COVID-19: Secondary | ICD-10-CM | POA: Insufficient documentation

## 2020-03-30 DIAGNOSIS — J439 Emphysema, unspecified: Secondary | ICD-10-CM | POA: Diagnosis not present

## 2020-03-30 DIAGNOSIS — R0902 Hypoxemia: Secondary | ICD-10-CM

## 2020-03-30 DIAGNOSIS — Z09 Encounter for follow-up examination after completed treatment for conditions other than malignant neoplasm: Secondary | ICD-10-CM | POA: Insufficient documentation

## 2020-03-30 DIAGNOSIS — R059 Cough, unspecified: Secondary | ICD-10-CM | POA: Diagnosis not present

## 2020-03-30 DIAGNOSIS — E119 Type 2 diabetes mellitus without complications: Secondary | ICD-10-CM

## 2020-03-30 MED ORDER — PREDNISONE 20 MG PO TABS: ORAL_TABLET | ORAL | 0 refills | Status: DC

## 2020-03-30 MED FILL — predniSONE 20 MG TABS: 20 | 10 days supply | Qty: 15 | Fill #0

## 2020-03-31 ENCOUNTER — Encounter: Payer: Self-pay | Admitting: Family Medicine

## 2020-03-31 DIAGNOSIS — U071 COVID-19: Secondary | ICD-10-CM | POA: Diagnosis not present

## 2020-03-31 DIAGNOSIS — J441 Chronic obstructive pulmonary disease with (acute) exacerbation: Secondary | ICD-10-CM | POA: Diagnosis not present

## 2020-03-31 DIAGNOSIS — I10 Essential (primary) hypertension: Secondary | ICD-10-CM | POA: Diagnosis not present

## 2020-03-31 DIAGNOSIS — J440 Chronic obstructive pulmonary disease with acute lower respiratory infection: Secondary | ICD-10-CM | POA: Diagnosis not present

## 2020-03-31 DIAGNOSIS — J1282 Pneumonia due to coronavirus disease 2019: Secondary | ICD-10-CM | POA: Diagnosis not present

## 2020-03-31 DIAGNOSIS — E785 Hyperlipidemia, unspecified: Secondary | ICD-10-CM | POA: Diagnosis not present

## 2020-03-31 DIAGNOSIS — J9601 Acute respiratory failure with hypoxia: Secondary | ICD-10-CM | POA: Diagnosis not present

## 2020-03-31 DIAGNOSIS — I7300 Raynaud's syndrome without gangrene: Secondary | ICD-10-CM | POA: Diagnosis not present

## 2020-03-31 DIAGNOSIS — E1151 Type 2 diabetes mellitus with diabetic peripheral angiopathy without gangrene: Secondary | ICD-10-CM | POA: Diagnosis not present

## 2020-03-31 LAB — TSH: TSH: 2.99 u[IU]/mL (ref 0.35–4.50)

## 2020-03-31 LAB — CBC
HCT: 37.7 % — ABNORMAL LOW (ref 39.0–52.0)
Hemoglobin: 12.7 g/dL — ABNORMAL LOW (ref 13.0–17.0)
MCHC: 33.8 g/dL (ref 30.0–36.0)
MCV: 95.3 fl (ref 78.0–100.0)
Platelets: 212 10*3/uL (ref 150.0–400.0)
RBC: 3.95 Mil/uL — ABNORMAL LOW (ref 4.22–5.81)
RDW: 14.2 % (ref 11.5–15.5)
WBC: 6.5 10*3/uL (ref 4.0–10.5)

## 2020-03-31 LAB — COMPREHENSIVE METABOLIC PANEL
ALT: 16 U/L (ref 0–53)
AST: 16 U/L (ref 0–37)
Albumin: 3.4 g/dL — ABNORMAL LOW (ref 3.5–5.2)
Alkaline Phosphatase: 71 U/L (ref 39–117)
BUN: 16 mg/dL (ref 6–23)
CO2: 30 mEq/L (ref 19–32)
Calcium: 9 mg/dL (ref 8.4–10.5)
Chloride: 98 mEq/L (ref 96–112)
Creatinine, Ser: 1.01 mg/dL (ref 0.40–1.50)
GFR: 67.83 mL/min (ref >60.00)
Glucose, Bld: 142 mg/dL — ABNORMAL HIGH (ref 70–99)
Potassium: 4.1 mEq/L (ref 3.5–5.1)
Sodium: 136 mEq/L (ref 135–145)
Total Bilirubin: 0.4 mg/dL (ref 0.2–1.2)
Total Protein: 7.1 g/dL (ref 6.0–8.3)

## 2020-04-03 DIAGNOSIS — E1151 Type 2 diabetes mellitus with diabetic peripheral angiopathy without gangrene: Secondary | ICD-10-CM | POA: Diagnosis not present

## 2020-04-03 DIAGNOSIS — U071 COVID-19: Secondary | ICD-10-CM | POA: Diagnosis not present

## 2020-04-03 DIAGNOSIS — I7300 Raynaud's syndrome without gangrene: Secondary | ICD-10-CM | POA: Diagnosis not present

## 2020-04-03 DIAGNOSIS — J440 Chronic obstructive pulmonary disease with acute lower respiratory infection: Secondary | ICD-10-CM | POA: Diagnosis not present

## 2020-04-03 DIAGNOSIS — J441 Chronic obstructive pulmonary disease with (acute) exacerbation: Secondary | ICD-10-CM | POA: Diagnosis not present

## 2020-04-03 DIAGNOSIS — J9601 Acute respiratory failure with hypoxia: Secondary | ICD-10-CM | POA: Diagnosis not present

## 2020-04-03 DIAGNOSIS — J1282 Pneumonia due to coronavirus disease 2019: Secondary | ICD-10-CM | POA: Diagnosis not present

## 2020-04-03 DIAGNOSIS — I10 Essential (primary) hypertension: Secondary | ICD-10-CM | POA: Diagnosis not present

## 2020-04-03 DIAGNOSIS — E785 Hyperlipidemia, unspecified: Secondary | ICD-10-CM | POA: Diagnosis not present

## 2020-04-04 ENCOUNTER — Telehealth: Payer: Self-pay | Admitting: Family Medicine

## 2020-04-04 DIAGNOSIS — H35033 Hypertensive retinopathy, bilateral: Secondary | ICD-10-CM | POA: Diagnosis not present

## 2020-04-04 DIAGNOSIS — H3509 Other intraretinal microvascular abnormalities: Secondary | ICD-10-CM | POA: Diagnosis not present

## 2020-04-04 DIAGNOSIS — H43823 Vitreomacular adhesion, bilateral: Secondary | ICD-10-CM | POA: Diagnosis not present

## 2020-04-04 DIAGNOSIS — H35362 Drusen (degenerative) of macula, left eye: Secondary | ICD-10-CM | POA: Diagnosis not present

## 2020-04-04 NOTE — Telephone Encounter (Signed)
CallerGarrette [name] Call Back @ [phone]  FYI   Oxygen Concentrator  Patient believes he has located an oxygen concentrator. Patient will go look at it tomorrow morning.

## 2020-04-05 DIAGNOSIS — J440 Chronic obstructive pulmonary disease with acute lower respiratory infection: Secondary | ICD-10-CM | POA: Diagnosis not present

## 2020-04-05 DIAGNOSIS — J9601 Acute respiratory failure with hypoxia: Secondary | ICD-10-CM | POA: Diagnosis not present

## 2020-04-05 DIAGNOSIS — I10 Essential (primary) hypertension: Secondary | ICD-10-CM | POA: Diagnosis not present

## 2020-04-05 DIAGNOSIS — J441 Chronic obstructive pulmonary disease with (acute) exacerbation: Secondary | ICD-10-CM | POA: Diagnosis not present

## 2020-04-05 DIAGNOSIS — U071 COVID-19: Secondary | ICD-10-CM | POA: Diagnosis not present

## 2020-04-05 DIAGNOSIS — J1282 Pneumonia due to coronavirus disease 2019: Secondary | ICD-10-CM | POA: Diagnosis not present

## 2020-04-05 DIAGNOSIS — E785 Hyperlipidemia, unspecified: Secondary | ICD-10-CM | POA: Diagnosis not present

## 2020-04-05 DIAGNOSIS — I7300 Raynaud's syndrome without gangrene: Secondary | ICD-10-CM | POA: Diagnosis not present

## 2020-04-05 DIAGNOSIS — E1151 Type 2 diabetes mellitus with diabetic peripheral angiopathy without gangrene: Secondary | ICD-10-CM | POA: Diagnosis not present

## 2020-04-05 NOTE — Telephone Encounter (Signed)
FYI

## 2020-04-06 DIAGNOSIS — I7300 Raynaud's syndrome without gangrene: Secondary | ICD-10-CM | POA: Diagnosis not present

## 2020-04-06 DIAGNOSIS — I10 Essential (primary) hypertension: Secondary | ICD-10-CM | POA: Diagnosis not present

## 2020-04-06 DIAGNOSIS — U071 COVID-19: Secondary | ICD-10-CM | POA: Diagnosis not present

## 2020-04-06 DIAGNOSIS — J9601 Acute respiratory failure with hypoxia: Secondary | ICD-10-CM | POA: Diagnosis not present

## 2020-04-06 DIAGNOSIS — E785 Hyperlipidemia, unspecified: Secondary | ICD-10-CM | POA: Diagnosis not present

## 2020-04-06 DIAGNOSIS — E1151 Type 2 diabetes mellitus with diabetic peripheral angiopathy without gangrene: Secondary | ICD-10-CM | POA: Diagnosis not present

## 2020-04-06 DIAGNOSIS — J1282 Pneumonia due to coronavirus disease 2019: Secondary | ICD-10-CM | POA: Diagnosis not present

## 2020-04-06 DIAGNOSIS — J440 Chronic obstructive pulmonary disease with acute lower respiratory infection: Secondary | ICD-10-CM | POA: Diagnosis not present

## 2020-04-06 DIAGNOSIS — J441 Chronic obstructive pulmonary disease with (acute) exacerbation: Secondary | ICD-10-CM | POA: Diagnosis not present

## 2020-04-07 DIAGNOSIS — E785 Hyperlipidemia, unspecified: Secondary | ICD-10-CM | POA: Diagnosis not present

## 2020-04-07 DIAGNOSIS — J9601 Acute respiratory failure with hypoxia: Secondary | ICD-10-CM | POA: Diagnosis not present

## 2020-04-07 DIAGNOSIS — U071 COVID-19: Secondary | ICD-10-CM | POA: Diagnosis not present

## 2020-04-07 DIAGNOSIS — J440 Chronic obstructive pulmonary disease with acute lower respiratory infection: Secondary | ICD-10-CM | POA: Diagnosis not present

## 2020-04-07 DIAGNOSIS — J1282 Pneumonia due to coronavirus disease 2019: Secondary | ICD-10-CM | POA: Diagnosis not present

## 2020-04-07 DIAGNOSIS — E1151 Type 2 diabetes mellitus with diabetic peripheral angiopathy without gangrene: Secondary | ICD-10-CM | POA: Diagnosis not present

## 2020-04-07 DIAGNOSIS — I10 Essential (primary) hypertension: Secondary | ICD-10-CM | POA: Diagnosis not present

## 2020-04-07 DIAGNOSIS — I7300 Raynaud's syndrome without gangrene: Secondary | ICD-10-CM | POA: Diagnosis not present

## 2020-04-07 DIAGNOSIS — J441 Chronic obstructive pulmonary disease with (acute) exacerbation: Secondary | ICD-10-CM | POA: Diagnosis not present

## 2020-04-11 NOTE — Telephone Encounter (Signed)
Patient called , requesting a call back from [place], he would like to update you and Dr Lorelei Pont.

## 2020-04-12 NOTE — Telephone Encounter (Signed)
Left message to return call 

## 2020-04-13 ENCOUNTER — Telehealth: Payer: Self-pay | Admitting: Family Medicine

## 2020-04-13 NOTE — Telephone Encounter (Signed)
Patient states he would like an order for a carrying case for his portable oxygen

## 2020-04-14 NOTE — Telephone Encounter (Signed)
Patient states the he needs an order to receive a POC, portable oxygen concentrator through his insurance company, using [employer] maybe. Patient would like an order placed. Patient states he has services through [employer] for home oxygen. Patient states he had problems with POC  And [employer] will not service it.

## 2020-04-14 NOTE — Telephone Encounter (Signed)
Tried calling patient. No answer. Left voicemail to return call.

## 2020-04-14 NOTE — Telephone Encounter (Signed)
Johnathan [name]- can you call him back, did he call again about the portable oxygen or is this an old/ duplicate message from [place] at 2pm? Please ask him if he would like me to have him see GI- they can check his esophagus for any problems which could explain his swallowing difficulty  Thank you!

## 2020-04-14 NOTE — Telephone Encounter (Signed)
Spoke with patient. He states his portable oxygen container he got has worked great for him. He was able to take on a trip earlier this week. He did state the strap broke. He was able to fix it himself however in future if it breaks the company can send a new one he will just need an order. He will let us know if that is needed but is ok for now.   His only concern is he states the "warm feeling"he has in his throat has not gone away. He states there is no pain but he often has difficulty swallowing and needs to clear his throat. He states he is currently being treated for GERD with omeprazole. He isn't sure if his esophagus needs to be stretched or if something else should be done?

## 2020-04-18 ENCOUNTER — Telehealth: Payer: Self-pay | Admitting: Family Medicine

## 2020-04-18 DIAGNOSIS — J441 Chronic obstructive pulmonary disease with (acute) exacerbation: Secondary | ICD-10-CM | POA: Diagnosis not present

## 2020-04-18 DIAGNOSIS — U071 COVID-19: Secondary | ICD-10-CM | POA: Diagnosis not present

## 2020-04-18 DIAGNOSIS — I7300 Raynaud's syndrome without gangrene: Secondary | ICD-10-CM | POA: Diagnosis not present

## 2020-04-18 DIAGNOSIS — J440 Chronic obstructive pulmonary disease with acute lower respiratory infection: Secondary | ICD-10-CM | POA: Diagnosis not present

## 2020-04-18 DIAGNOSIS — J9601 Acute respiratory failure with hypoxia: Secondary | ICD-10-CM | POA: Diagnosis not present

## 2020-04-18 DIAGNOSIS — I10 Essential (primary) hypertension: Secondary | ICD-10-CM | POA: Diagnosis not present

## 2020-04-18 DIAGNOSIS — E1151 Type 2 diabetes mellitus with diabetic peripheral angiopathy without gangrene: Secondary | ICD-10-CM | POA: Diagnosis not present

## 2020-04-18 DIAGNOSIS — J1282 Pneumonia due to coronavirus disease 2019: Secondary | ICD-10-CM | POA: Diagnosis not present

## 2020-04-18 DIAGNOSIS — E785 Hyperlipidemia, unspecified: Secondary | ICD-10-CM | POA: Diagnosis not present

## 2020-04-18 NOTE — Telephone Encounter (Signed)
Patient states [employer] need prior authorization for sildenafil (REVATIO) 20 MG tablet [[id number]]   budesonide-formoterol [facility]) 160-4.5 MCG/ACT inhaler [[id number]]   [place], [place] Phone:  [phone]  Fax:  [phone]

## 2020-04-19 MED ORDER — BUDESONIDE-FORMOTEROL FUMARATE 160-4.5 MCG/ACT IN AERO: 2.0000 | INHALATION_SPRAY | Freq: Two times a day (BID) | RESPIRATORY_TRACT | 12 refills | Status: DC

## 2020-04-19 MED ORDER — SILDENAFIL CITRATE 20 MG PO TABS: 10.0000 mg | ORAL_TABLET | Freq: Every day | ORAL | 1 refills | Status: DC

## 2020-04-19 NOTE — Telephone Encounter (Signed)
Medications refilled

## 2020-04-19 NOTE — Telephone Encounter (Signed)
Prior authorization has been initiated for Sildenafil via cover my meds.   KEY: [id number]  Waiting on determination.

## 2020-04-20 ENCOUNTER — Telehealth: Payer: Self-pay

## 2020-04-21 DIAGNOSIS — U071 COVID-19: Secondary | ICD-10-CM | POA: Diagnosis not present

## 2020-04-21 NOTE — Telephone Encounter (Signed)
Spoke with patient yesterday. He states she seems to be improving some. He does have times where is oxygen will drop. He states the lowest its been is 79. His main question is he noticed he has gained around 5-6 lbs from being ill since covid diagnosis. He would like to return to the [facility] and would like your thoughts on if that would be ok considering his health status. What kind of exercises would you think he would be able to handle including is it ok for him to sit in the sauna?

## 2020-04-21 NOTE — Telephone Encounter (Signed)
Patient would like for you know that he received the oxygen portable. He is a little upset since, he did not get a manual.

## 2020-04-21 NOTE — Telephone Encounter (Signed)
Hi Johnathan [name]- thank you for managing this, let me know if you need me to call him back.  (if you don't have time, I can do it!)  I would say NO sauna, the heat may not be good for his lungs I don't know if he needs to work out or worry about his weight until he is off oxygen.  However [facility] and walk slowly on track or treadmill

## 2020-04-25 MED ORDER — SILDENAFIL CITRATE 50 MG PO TABS: 50.0000 mg | ORAL_TABLET | Freq: Every day | ORAL | 3 refills | Status: DC | PRN

## 2020-04-25 NOTE — Telephone Encounter (Signed)
Called patient discussed advise. He agreed with advice.   He did state since sildenafil was denied he-although he took it for raynaud's disease he would like an rx for viagra. "full strength for ED" per patient. He would like this to go to [facility] on [address]. I did advise insurance may require a PA on that as well as there is a chance it may not get approved. He is understanding and also states he has an appointment with the pulmonologist tomorrow and to be on the lookout for the report.

## 2020-04-25 NOTE — Telephone Encounter (Signed)
Called pt- advised that I would recommend caution regarding sexual activity until he is off oxygen.  He states understanding. Does not plan to use the viagra right away but would like to work on obtaining an rx for later

## 2020-04-25 NOTE — Addendum Note (Signed)
Addended by: Lamar Blinks C on: 04/25/2020 05:37 PM   Modules accepted: Orders

## 2020-04-26 ENCOUNTER — Ambulatory Visit (INDEPENDENT_AMBULATORY_CARE_PROVIDER_SITE_OTHER): Payer: Medicare HMO | Admitting: Pulmonary Disease

## 2020-04-26 ENCOUNTER — Encounter: Payer: Self-pay | Admitting: Pulmonary Disease

## 2020-04-26 ENCOUNTER — Other Ambulatory Visit: Payer: Self-pay

## 2020-04-26 VITALS — BP 136/70 | HR 91 | Temp 98.0°F | Ht 70.0 in | Wt 159.2 lb

## 2020-04-26 DIAGNOSIS — U099 Post covid-19 condition, unspecified: Secondary | ICD-10-CM

## 2020-04-26 DIAGNOSIS — J849 Interstitial pulmonary disease, unspecified: Secondary | ICD-10-CM

## 2020-04-26 DIAGNOSIS — J439 Emphysema, unspecified: Secondary | ICD-10-CM | POA: Diagnosis not present

## 2020-04-26 DIAGNOSIS — J449 Chronic obstructive pulmonary disease, unspecified: Secondary | ICD-10-CM | POA: Insufficient documentation

## 2020-04-26 LAB — CBC WITH DIFFERENTIAL/PLATELET
Basophils Absolute: 0 10*3/uL (ref 0.0–0.1)
Basophils Relative: 0.5 % (ref 0.0–3.0)
Eosinophils Absolute: 0.2 10*3/uL (ref 0.0–0.7)
Eosinophils Relative: 4.1 % (ref 0.0–5.0)
HCT: 37.3 % — ABNORMAL LOW (ref 39.0–52.0)
Hemoglobin: 12.8 g/dL — ABNORMAL LOW (ref 13.0–17.0)
Lymphocytes Relative: 22.8 % (ref 12.0–46.0)
Lymphs Abs: 1.1 10*3/uL (ref 0.7–4.0)
MCHC: 34.2 g/dL (ref 30.0–36.0)
MCV: 94.3 fl (ref 78.0–100.0)
Monocytes Absolute: 0.6 10*3/uL (ref 0.1–1.0)
Monocytes Relative: 12.9 % — ABNORMAL HIGH (ref 3.0–12.0)
Neutro Abs: 2.8 10*3/uL (ref 1.4–7.7)
Neutrophils Relative %: 59.7 % (ref 43.0–77.0)
Platelets: 183 10*3/uL (ref 150.0–400.0)
RBC: 3.96 Mil/uL — ABNORMAL LOW (ref 4.22–5.81)
RDW: 14.7 % (ref 11.5–15.5)
WBC: 4.7 10*3/uL (ref 4.0–10.5)

## 2020-04-26 MED ORDER — TRELEGY ELLIPTA 100-62.5-25 MCG/INH IN AEPB: 1.0000 | INHALATION_SPRAY | Freq: Every day | RESPIRATORY_TRACT | 2 refills | Status: DC

## 2020-04-26 NOTE — Progress Notes (Signed)
Johnathan [name]    [id number]    08/23/1932  Primary Care Physician:Copland, Gay Filler, MD  Referring Physician: Darreld Mclean, MD [address] [place],   [address]  Chief complaint: Consult for dyspnea, emphysema, post COVID-21  HPI: 87 year old ex-smoker with history of emphysema [no PFTs on record], Raynaud's syndrome Maintained on Symbicort for many years  Hospitalized for COVID-19 in early February in spite of getting the booster vaccine. He was treated with IV remdesivir, steroids, discharged on supplemental oxygen Post discharge he continues to have persistent dyspnea on exertion, hypoxia whenever he stops oxygen.  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  No feather pillows or comforter. Smoking history: 45-pack-year smoker.  Quit in the 1990s Travel history: No significant travel history Relevant family history: Father had COPD.  He was a smoker.  Outpatient Encounter Medications as of 04/26/2020  Medication Sig  . albuterol (PROVENTIL HFA;VENTOLIN HFA) 108 (90 Base) MCG/ACT inhaler Inhale 2 puffs into the lungs every 4 (four) hours as needed for wheezing or shortness of breath (or coughing).  Marland Kitchen amLODipine (NORVASC) 10 MG tablet Take 1 tablet (10 mg total) by mouth daily.  Marland Kitchen aspirin EC 81 MG tablet Take 81 mg by mouth daily.  . budesonide-formoterol (SYMBICORT) 160-4.5 MCG/ACT inhaler Inhale 2 puffs into the lungs 2 (two) times daily.  . Cyanocobalamin (B-12) 1000 MCG CAPS Take 1 capsule by mouth daily.  . Flaxseed, Linseed, (FLAXSEED OIL) 1200 MG CAPS Take 1 capsule by mouth daily.  . Ginkgo Biloba EXTR Take 1 tablet by mouth 2 (two) times daily.  . meloxicam (MOBIC) 15 MG tablet Take 1 tablet (15 mg total) by mouth daily. Use as needed for  joint pain  . metFORMIN (GLUCOPHAGE) 500 MG tablet Take 250 mg by mouth daily.  . Misc Natural Products (HIMALAYAN GOJI PO) Take 1 tablet by mouth in the morning and at bedtime.   . Multiple Vitamin (MULTIVITAMIN WITH MINERALS) TABS tablet Take 1 tablet by mouth daily.  Marland Kitchen omeprazole (PRILOSEC) 20 MG capsule Take 1 capsule (20 mg total) by mouth 2 (two) times daily.  . pravastatin (PRAVACHOL) 40 MG tablet Take 0.5 tablets (20 mg total) by mouth daily. Takes 1/2 pill daily  . primidone (MYSOLINE) 50 MG tablet Take 1 tablet (50 mg total) by mouth 2 (two) times daily.  . sildenafil (REVATIO) 20 MG tablet Take 0.5 tablets (10 mg total) by mouth daily. Use for Raynaud's symptoms  . sildenafil (VIAGRA) 50 MG tablet Take 1 tablet (50 mg total) by mouth daily as needed for erectile dysfunction.  Marland Kitchen terazosin (HYTRIN) 1 MG capsule Take 1 capsule (1 mg total) by mouth 2 (two) times daily.  . Tetrahydrozoline HCl (VISINE OP) Apply 1 drop to eye daily.  . benzonatate (TESSALON) 100 MG capsule Take 1 capsule (100 mg total) by mouth every 8 (eight) hours as needed for cough. (Patient not taking: Reported on 04/26/2020)  . chlorpheniramine-HYDROcodone (TUSSIONEX) 10-8 MG/5ML SUER Take 5 mLs by mouth every 12 (twelve) hours. (Patient not taking: Reported on 04/26/2020)  . ondansetron (ZOFRAN) 4 MG tablet Take 1 tablet (4 mg total) by mouth every 8 (eight) hours as needed for nausea or vomiting. (Patient not taking: Reported on 04/26/2020)  . [DISCONTINUED] predniSONE (DELTASONE) 20 MG  tablet Take 40 mg daily for 5 days, then 20 mg daily for 5 days  . [DISCONTINUED] predniSONE (DELTASONE) 20 MG tablet TAKE 2 TABLETS BY MOUTH DAILY FOR 5 DAYS, THEN TAKE 1 TABLET DAILY FOR 5 DAYS   No facility-administered encounter medications on file as of 04/26/2020.    Allergies as of 04/26/2020  . (No Known Allergies)    Past Medical History:  Diagnosis Date  . COPD (chronic obstructive pulmonary disease) ([place])   . Diverticulosis    . DM type 2 (diabetes mellitus, type 2) ([place])   . GERD (gastroesophageal reflux disease)    uses prilosec   . Hypertension   . Pneumonia 2018  . Raynaud's disease   . Shortness of breath dyspnea    WITH EXERTION  . Sinusitis   . Tremors of nervous system     Past Surgical History:  Procedure Laterality Date  . COLONOSCOPY  03/27/2005  . HERNIA REPAIR    . INGUINAL HERNIA REPAIR N/A 05/06/2019   Procedure: LAPAROSCOPIC RIGHT AND LEFT INGUINAL HERNIA(S) REPAIR;  Surgeon: Michael Boston, MD;  Location: [facility];  Service: General;  Laterality: N/A;    Family History  Problem Relation Age of Onset  . Dementia Brother   . Diabetes Brother   . Heart attack Father   . Dementia Father     Social History   Socioeconomic History  . Marital status: Single    Spouse name: Not on file  . Number of children: Not on file  . Years of education: Not on file  . Highest education level: Not on file  Occupational History  . Not on file  Tobacco Use  . Smoking status: Former Smoker    Packs/day: 1.00    Years: 45.00    Pack years: 45.00    Types: Cigarettes    Quit date: 01/21/1994    Years since quitting: 26.2  . Smokeless tobacco: Never Used  Substance and Sexual Activity  . Alcohol use: Yes    Comment: a drink daily with dinner  . Drug use: No  . Sexual activity: Not on file  Other Topics Concern  . Not on file  Social History Narrative  . Not on file   Social Determinants of Health   Financial Resource Strain: Not on file  Food Insecurity: Not on file  Transportation Needs: Not on file  Physical Activity: Not on file  Stress: Not on file  Social Connections: Not on file  Intimate Partner Violence: Not on file    Review of systems: Review of Systems  Constitutional: Negative for fever and chills.  HENT: Negative.   Eyes: Negative for blurred vision.  Respiratory: as per HPI  Cardiovascular: Negative for chest pain and palpitations.   Gastrointestinal: Negative for vomiting, diarrhea, blood per rectum. Genitourinary: Negative for dysuria, urgency, frequency and hematuria.  Musculoskeletal: Negative for myalgias, back pain and joint pain.  Skin: Negative for itching and rash.  Neurological: Negative for dizziness, tremors, focal weakness, seizures and loss of consciousness.  Endo/Heme/Allergies: Negative for environmental allergies.  Psychiatric/Behavioral: Negative for depression, suicidal ideas and hallucinations.  All other systems reviewed and are negative.  Physical Exam: Blood pressure 136/70, pulse 91, temperature 98 F (36.7 C), temperature source Temporal, height 5\' 10"  (1.778 m), weight 159 lb 3.2 oz (72.2 kg), SpO2 96 %. Gen:      No acute distress HEENT:  EOMI, sclera anicteric Neck:     No masses; no thyromegaly Lungs:  Clear to auscultation bilaterally; normal respiratory effort CV:         Regular rate and rhythm; no murmurs Abd:      + bowel sounds; soft, non-tender; no palpable masses, no distension Ext:    No edema; adequate peripheral perfusion Skin:      Warm and dry; no rash Neuro: alert and oriented x 3 Psych: normal mood and affect  Data Reviewed: Imaging: Chest x-ray 02/20/2020-hyperinflation, no acute process Chest x-ray 02/25/2020-emphysema, mild left base opacity Chest x-ray 03/30/2020-bilateral infiltrates. I have reviewed the images personally.  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

## 2020-04-26 NOTE — Patient Instructions (Signed)
We will schedule a high-resolution CT and lung function test for better evaluation of the lung Stop Symbicort and start Trelegy inhaler Referral for pulmonary rehab for post Covid interstitial lung disease Continue supplemental oxygen for now  Check labs including CBC differential, IgE, ANA, rheumatoid factor, CCP  Follow-up in 1 to 2 months after PFTs

## 2020-04-27 ENCOUNTER — Ambulatory Visit: Payer: Medicare HMO | Admitting: Family Medicine

## 2020-04-28 ENCOUNTER — Ambulatory Visit (HOSPITAL_BASED_OUTPATIENT_CLINIC_OR_DEPARTMENT_OTHER)
Admission: RE | Admit: 2020-04-28 | Discharge: 2020-04-28 | Disposition: A | Discharge status: Home / Self Care | Payer: Medicare HMO | Source: Ambulatory Visit | Attending: Pulmonary Disease | Admitting: Pulmonary Disease

## 2020-04-28 ENCOUNTER — Other Ambulatory Visit: Payer: Self-pay

## 2020-04-28 DIAGNOSIS — R0602 Shortness of breath: Secondary | ICD-10-CM | POA: Diagnosis not present

## 2020-04-28 DIAGNOSIS — J849 Interstitial pulmonary disease, unspecified: Secondary | ICD-10-CM | POA: Insufficient documentation

## 2020-04-28 LAB — RHEUMATOID FACTOR: Rheumatoid fact SerPl-aCnc: <14 IU/mL (ref <14)

## 2020-04-28 LAB — IGE: IgE (Immunoglobulin E), Serum: 858 kU/L — ABNORMAL HIGH (ref <=114)

## 2020-04-28 LAB — ANA: Anti Nuclear Antibody (ANA): NEGATIVE

## 2020-04-28 LAB — CYCLIC CITRUL PEPTIDE ANTIBODY, IGG: Cyclic Citrullin Peptide Ab: <16 UNITS

## 2020-04-29 NOTE — Progress Notes (Signed)
[name] [address] [place], [place] [address] [phone]  Date:  05/01/2020   Name:  Johnathan [name]   DOB:  June 28, 1932   MRN:  [id number]  PCP:  Johnathan Mclean, MD    Chief Complaint: Oxygen Desaturation   History of Present Illness:  Johnathan [name] is a 87 y.o. very pleasant male patient who presents with the following:  Johnathan [name] is here for a follow visit- history of well controlled diabetes and COPD  He had been in quite good health for age until he caught covid 76 this past February.  Since that time he has required home oxygen.  We have been able to get him a concentrator so he is more mobile  At our last visit last month his CXR actually looked worse from hospital discharge.  I had him start on steroids and referred to pulmonary.  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 Pulmonary parenchymal pattern of fibrosis may be postinfectious/postinflammatory in etiology when compared to 04/21/2014. Nonspecific interstitial pneumonitis can also have this appearance. Findings are indeterminate for UIP per consensus guidelines: Diagnosis of Idiopathic Pulmonary Fibrosis: An Official ATS/ERS/JRS/ALAT Clinical Practice  Guideline. [place], Iss 5, [phone], Sep 21 2016. 2. Aortic atherosclerosis (ICD10-I70.0). Coronary artery calcification. 3. Enlarged pulmonary arteries, indicative of pulmonary arterial hypertension. 4.  Emphysema (ICD10-J43.9).    Lab Results  Component Value Date   HGBA1C 6.5 (H) 02/25/2020   At this point Tre feels that he is stable, he is very happy to have a portable concentrator [facility] which I think is fine, he will take it easy.  We went over plans for PFT testing and follow-up with pulmonology.  I also went over his recent CT scan as above Patient Active Problem List   Diagnosis Date Noted  . Pulmonary emphysema ([place]) 04/26/2020  . COVID-19 02/25/2020  . Shingles 11/14/2019  . Raynaud's syndrome 11/14/2019  . Skin cancer 11/14/2019  . Left inguinal hernia s/p lap repair w mesh 05/06/2019 05/06/2019  . Recurrent right inguinal hernia s/p lap repair w mesh 05/06/2019 03/01/2019  . Diabetes mellitus ([place]) 03/01/2019  . Pneumonia 04/21/2014  . COPD with acute exacerbation ([place]) 04/21/2014    Past Medical History:  Diagnosis Date  . COPD (chronic obstructive pulmonary disease) ([place])   . Diverticulosis   . DM type 2 (diabetes mellitus, type 2) ([place])   . GERD (gastroesophageal reflux disease)    uses prilosec   . Hypertension   . Pneumonia 2018  . Raynaud's disease   . Shortness of breath dyspnea    WITH EXERTION  . Sinusitis   . Tremors of nervous system     Past Surgical History:  Procedure Laterality Date  .  COLONOSCOPY  03/27/2005  . HERNIA REPAIR    . INGUINAL HERNIA REPAIR N/A 05/06/2019   Procedure: LAPAROSCOPIC RIGHT AND LEFT INGUINAL HERNIA(S) REPAIR;  Surgeon: Michael Boston, MD;  Location: [facility];  Service: General;  Laterality: N/A;    Social History   Tobacco Use  . Smoking status: Former Smoker    Packs/day: 1.00    Years: 45.00    Pack years: 45.00     Types: Cigarettes    Quit date: 01/21/1994    Years since quitting: 26.2  . Smokeless tobacco: Never Used  Substance Use Topics  . Alcohol use: Yes    Comment: a drink daily with dinner  . Drug use: No    Family History  Problem Relation Age of Onset  . Dementia Brother   . Diabetes Brother   . Heart attack Father   . Dementia Father     No Known Allergies  Medication list has been reviewed and updated.  Current Outpatient Medications on File Prior to Visit  Medication Sig Dispense Refill  . albuterol (PROVENTIL HFA;VENTOLIN HFA) 108 (90 Base) MCG/ACT inhaler Inhale 2 puffs into the lungs every 4 (four) hours as needed for wheezing or shortness of breath (or coughing). 1 Inhaler 0  . amLODipine (NORVASC) 10 MG tablet Take 1 tablet (10 mg total) by mouth daily. 30 tablet 2  . aspirin EC 81 MG tablet Take 81 mg by mouth daily.    . chlorpheniramine-HYDROcodone (TUSSIONEX) 10-8 MG/5ML SUER Take 5 mLs by mouth every 12 (twelve) hours. 140 mL 0  . Cyanocobalamin (B-12) 1000 MCG CAPS Take 1 capsule by mouth daily.    . Flaxseed, Linseed, (FLAXSEED OIL) 1200 MG CAPS Take 1 capsule by mouth daily.    . Fluticasone-Umeclidin-Vilant (TRELEGY ELLIPTA) 100-62.5-25 MCG/INH AEPB Inhale 1 puff into the lungs daily. 60 each 2  . Ginkgo Biloba EXTR Take 1 tablet by mouth 2 (two) times daily.    . meloxicam (MOBIC) 15 MG tablet Take 1 tablet (15 mg total) by mouth daily. Use as needed for joint pain 30 tablet 3  . metFORMIN (GLUCOPHAGE) 500 MG tablet Take 250 mg by mouth daily.    . Misc Natural Products (HIMALAYAN GOJI PO) Take 1 tablet by mouth in the morning and at bedtime.     . Multiple Vitamin (MULTIVITAMIN WITH MINERALS) TABS tablet Take 1 tablet by mouth daily.    Marland Kitchen omeprazole (PRILOSEC) 20 MG capsule Take 1 capsule (20 mg total) by mouth 2 (two) times daily. 180 capsule 1  . pravastatin (PRAVACHOL) 40 MG tablet Take 0.5 tablets (20 mg total) by mouth daily. Takes 1/2 pill daily 45 tablet 1   . primidone (MYSOLINE) 50 MG tablet Take 1 tablet (50 mg total) by mouth 2 (two) times daily. 180 tablet 1  . sildenafil (VIAGRA) 50 MG tablet Take 1 tablet (50 mg total) by mouth daily as needed for erectile dysfunction. 10 tablet 3  . terazosin (HYTRIN) 1 MG capsule Take 1 capsule (1 mg total) by mouth 2 (two) times daily. 180 capsule 1  . Tetrahydrozoline HCl (VISINE OP) Apply 1 drop to eye daily.     No current facility-administered medications on file prior to visit.    Review of Systems:  As per HPI- otherwise negative.    Physical Examination: Vitals:   05/01/20 1312  BP: 118/82  Pulse: 89  Resp: 18  SpO2: 90%   Vitals:   05/01/20 1312  Weight: 159 lb (72.1 kg)  Height: 5\' 10"  (1.778 m)   Body mass index is 22.81 kg/m. Ideal Body Weight: Weight in (lb) to have BMI = 25: 173.9  GEN: no acute distress. Normal weight for age, looks well - talkative as per his normal  HEENT: Atraumatic, Normocephalic.  Ears and Nose: No external deformity. CV: RRR, No M/G/R. No JVD. No thrill. No extra heart sounds. PULM: CTA B, no wheezes, crackles, rhonchi. No retractions. No resp. distress. No accessory muscle use. EXTR: No c/c/e PSYCH: Normally interactive. Conversant.   Assessment and Plan: Oxygen desaturation  History of COVID-19  Pulmonary fibrosis (HCC)  Pt is here today for a follow-up  He has required oxygen since he had covid 19 earlier this year.  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  Advised him that I am not sure when he will be able to stop using oxygen, we hope this will happen for him but it is uncertain.  He will let me know if any change or worsening.  Otherwise, asked him to follow-up with me in about 4 to 6 months assuming all is well This visit occurred during the SARS-CoV-2 public health emergency.  Safety protocols were in place, including screening  questions prior to the visit, additional usage of staff PPE, and extensive cleaning of exam room while observing appropriate contact time as indicated for disinfecting solutions.    Signed Lamar Blinks, MD

## 2020-04-29 NOTE — Patient Instructions (Addendum)
Good to see you again today! I think you are doing well, we hope that your lungs will eventually heal to the point that you do not need oxygen any longer but so far we don't know  Please see me in about 3 months, sooner if need be

## 2020-05-01 ENCOUNTER — Ambulatory Visit (INDEPENDENT_AMBULATORY_CARE_PROVIDER_SITE_OTHER): Payer: Medicare HMO | Admitting: Family Medicine

## 2020-05-01 ENCOUNTER — Encounter: Payer: Self-pay | Admitting: Family Medicine

## 2020-05-01 ENCOUNTER — Other Ambulatory Visit: Payer: Self-pay

## 2020-05-01 VITALS — BP 118/82 | HR 89 | Resp 18 | Ht 70.0 in | Wt 159.0 lb

## 2020-05-01 DIAGNOSIS — Z8616 Personal history of COVID-19: Secondary | ICD-10-CM | POA: Diagnosis not present

## 2020-05-01 DIAGNOSIS — R0902 Hypoxemia: Secondary | ICD-10-CM | POA: Diagnosis not present

## 2020-05-01 DIAGNOSIS — J8410 Pulmonary fibrosis, unspecified: Secondary | ICD-10-CM

## 2020-05-03 DIAGNOSIS — J9601 Acute respiratory failure with hypoxia: Secondary | ICD-10-CM | POA: Diagnosis not present

## 2020-05-03 DIAGNOSIS — I10 Essential (primary) hypertension: Secondary | ICD-10-CM | POA: Diagnosis not present

## 2020-05-03 DIAGNOSIS — J1282 Pneumonia due to coronavirus disease 2019: Secondary | ICD-10-CM | POA: Diagnosis not present

## 2020-05-03 DIAGNOSIS — J440 Chronic obstructive pulmonary disease with acute lower respiratory infection: Secondary | ICD-10-CM | POA: Diagnosis not present

## 2020-05-03 DIAGNOSIS — J441 Chronic obstructive pulmonary disease with (acute) exacerbation: Secondary | ICD-10-CM | POA: Diagnosis not present

## 2020-05-03 DIAGNOSIS — I7300 Raynaud's syndrome without gangrene: Secondary | ICD-10-CM | POA: Diagnosis not present

## 2020-05-03 DIAGNOSIS — E785 Hyperlipidemia, unspecified: Secondary | ICD-10-CM | POA: Diagnosis not present

## 2020-05-03 DIAGNOSIS — E1151 Type 2 diabetes mellitus with diabetic peripheral angiopathy without gangrene: Secondary | ICD-10-CM | POA: Diagnosis not present

## 2020-05-03 DIAGNOSIS — U071 COVID-19: Secondary | ICD-10-CM | POA: Diagnosis not present

## 2020-05-04 ENCOUNTER — Encounter (HOSPITAL_COMMUNITY): Payer: Self-pay | Admitting: *Deleted

## 2020-05-04 NOTE — Progress Notes (Addendum)
Received referral from Dr. [name] for this pt to participate in pulmonary rehab with the the diagnosis of IPF and secondary diagnosis of Post Covid 19.  Pt has greater than 4 week of persistent symptoms.  . Pt hospitalized in February for [place] 19 and discharged on home O2 which is new for him. Clinical review of pt follow up appt on 4/6 Pulmonary office note.  Pt with Covid Risk Score - 6. Pt appropriate for scheduling for Pulmonary rehab.  Will forward to support staff for scheduling and verification of insurance eligibility/benefits with pt consent. Will make initial call to review insurance benefits and where he is placed on the wait list for scheduling. [name], BSN Cardiac and Training and development officer

## 2020-05-08 ENCOUNTER — Other Ambulatory Visit: Payer: Medicare HMO

## 2020-05-09 ENCOUNTER — Telehealth: Payer: Self-pay | Admitting: Pulmonary Disease

## 2020-05-09 MED ORDER — TRELEGY ELLIPTA 100-62.5-25 MCG/INH IN AEPB
1.0000 | INHALATION_SPRAY | Freq: Every day | RESPIRATORY_TRACT | 1 refills | Status: DC
Start: 2020-05-09 — End: 2020-05-12

## 2020-05-09 NOTE — Telephone Encounter (Signed)
Refill of the trelegy has been sent to the [facility] as requested by the pt. Nothing further is needed.

## 2020-05-12 ENCOUNTER — Telehealth: Payer: Self-pay | Admitting: Family Medicine

## 2020-05-12 ENCOUNTER — Other Ambulatory Visit: Payer: Self-pay | Admitting: *Deleted

## 2020-05-12 MED ORDER — TRELEGY ELLIPTA 100-62.5-25 MCG/INH IN AEPB: 1.0000 | INHALATION_SPRAY | Freq: Every day | RESPIRATORY_TRACT | 2 refills | Status: DC

## 2020-05-12 NOTE — Telephone Encounter (Signed)
Medical Necessity form has been refaxed now.

## 2020-05-12 NOTE — Telephone Encounter (Signed)
Caller: John ([facility]) Call back number: [phone] Call fax # [phone]  They have not received the certificate of medical necessity was faxed on 04/25/20. Please re-fax to [phone]

## 2020-05-16 ENCOUNTER — Telehealth: Payer: Self-pay | Admitting: Pulmonary Disease

## 2020-05-16 DIAGNOSIS — H903 Sensorineural hearing loss, bilateral: Secondary | ICD-10-CM | POA: Diagnosis not present

## 2020-05-16 NOTE — Telephone Encounter (Signed)
ATC patient about CT results, per DPR and patient told front staff to leave detailed message. Detailed message left with results as well as upcoming appointment information. Advised patient to call back with any questions or concerns. Nothing further needed at this time.

## 2020-05-16 NOTE — Telephone Encounter (Signed)
Pt is returning a phone call about his ct results. Pt can be reached at [phone]. Pt would like a detailed message with his results on his answering machine if he does not answer

## 2020-05-21 DIAGNOSIS — U071 COVID-19: Secondary | ICD-10-CM | POA: Diagnosis not present

## 2020-05-22 ENCOUNTER — Telehealth: Payer: Self-pay

## 2020-05-22 NOTE — Telephone Encounter (Signed)
Patient called and stated Saturday he had a "terrible time getting his oxygen up". He states he has been trying to wean himself off of oxygen and doing small tasks around the house like cooking without oxygen. When he finished cooking his oxygen was in the 80s and would not raise. He placed his oxygen up to 3L and it got in to mid 90s. He is concerned of his lack of energy. He states he does see pulmonology Friday.   He wanted to make you aware of how he felt saturday and your thoughts.

## 2020-05-22 NOTE — Telephone Encounter (Signed)
Called him back- he is doing ok today, back on baseline 2L. He has PFTs scheduled for this week. He otherwise feels at baseline- no fever or cough.  He will contact me if any other issues

## 2020-05-23 ENCOUNTER — Other Ambulatory Visit (HOSPITAL_COMMUNITY)
Admission: RE | Admit: 2020-05-23 | Discharge: 2020-05-23 | Disposition: A | Discharge status: Home / Self Care | Payer: Medicare HMO | Source: Ambulatory Visit | Attending: Pulmonary Disease | Admitting: Pulmonary Disease

## 2020-05-23 DIAGNOSIS — Z01812 Encounter for preprocedural laboratory examination: Secondary | ICD-10-CM | POA: Diagnosis not present

## 2020-05-23 DIAGNOSIS — Z20822 Contact with and (suspected) exposure to covid-19: Secondary | ICD-10-CM | POA: Diagnosis not present

## 2020-05-23 LAB — SARS CORONAVIRUS 2 (TAT 6-24 HRS): SARS Coronavirus 2: NEGATIVE

## 2020-05-26 ENCOUNTER — Other Ambulatory Visit: Payer: Self-pay

## 2020-05-26 ENCOUNTER — Ambulatory Visit (INDEPENDENT_AMBULATORY_CARE_PROVIDER_SITE_OTHER): Payer: Medicare HMO | Admitting: Pulmonary Disease

## 2020-05-26 DIAGNOSIS — J849 Interstitial pulmonary disease, unspecified: Secondary | ICD-10-CM | POA: Diagnosis not present

## 2020-05-26 LAB — PULMONARY FUNCTION TEST
DL/VA % pred: 52 %
DL/VA: 2 ml/min/mmHg/L
DLCO cor % pred: 36 %
DLCO cor: 8.38 ml/min/mmHg
DLCO unc % pred: 34 %
DLCO unc: 7.92 ml/min/mmHg
FEF 25-75 Post: 0.5 L/sec
FEF 25-75 Pre: 0.59 L/sec
FEF2575-%Change-Post: -15 %
FEF2575-%Pred-Post: 30 %
FEF2575-%Pred-Pre: 36 %
FEV1-%Change-Post: -3 %
FEV1-%Pred-Post: 48 %
FEV1-%Pred-Pre: 50 %
FEV1-Post: 1.23 L
FEV1-Pre: 1.28 L
FEV1FVC-%Change-Post: -1 %
FEV1FVC-%Pred-Pre: 73 %
FEV6-%Change-Post: -5 %
FEV6-%Pred-Post: 69 %
FEV6-%Pred-Pre: 73 %
FEV6-Post: 2.34 L
FEV6-Pre: 2.47 L
FEV6FVC-%Change-Post: -2 %
FEV6FVC-%Pred-Post: 105 %
FEV6FVC-%Pred-Pre: 107 %
FVC-%Change-Post: -2 %
FVC-%Pred-Post: 66 %
FVC-%Pred-Pre: 67 %
FVC-Post: 2.41 L
FVC-Pre: 2.48 L
Post FEV1/FVC ratio: 51 %
Post FEV6/FVC ratio: 97 %
Pre FEV1/FVC ratio: 52 %
Pre FEV6/FVC Ratio: 100 %
RV % pred: 80 %
RV: 2.19 L
TLC % pred: 73 %
TLC: 5.03 L

## 2020-05-26 NOTE — Patient Instructions (Signed)
Full PFT performed today. °

## 2020-05-26 NOTE — Progress Notes (Signed)
Full PFT performed today. °

## 2020-05-30 ENCOUNTER — Ambulatory Visit (INDEPENDENT_AMBULATORY_CARE_PROVIDER_SITE_OTHER): Payer: Medicare HMO | Admitting: Family

## 2020-05-30 ENCOUNTER — Encounter: Payer: Self-pay | Admitting: Family

## 2020-05-30 ENCOUNTER — Other Ambulatory Visit: Payer: Self-pay

## 2020-05-30 VITALS — BP 120/58 | HR 92 | Temp 97.6°F | Ht 70.0 in | Wt 162.0 lb

## 2020-05-30 DIAGNOSIS — L989 Disorder of the skin and subcutaneous tissue, unspecified: Secondary | ICD-10-CM | POA: Diagnosis not present

## 2020-05-30 DIAGNOSIS — L309 Dermatitis, unspecified: Secondary | ICD-10-CM

## 2020-05-30 DIAGNOSIS — R600 Localized edema: Secondary | ICD-10-CM

## 2020-05-30 LAB — COMPREHENSIVE METABOLIC PANEL
ALT: 12 U/L (ref 0–53)
AST: 16 U/L (ref 0–37)
Albumin: 4.2 g/dL (ref 3.5–5.2)
Alkaline Phosphatase: 81 U/L (ref 39–117)
BUN: 20 mg/dL (ref 6–23)
CO2: 31 mEq/L (ref 19–32)
Calcium: 9.7 mg/dL (ref 8.4–10.5)
Chloride: 101 mEq/L (ref 96–112)
Creatinine, Ser: 1.17 mg/dL (ref 0.40–1.50)
GFR: 56.8 mL/min — ABNORMAL LOW (ref >60.00)
Glucose, Bld: 109 mg/dL — ABNORMAL HIGH (ref 70–99)
Potassium: 4.6 mEq/L (ref 3.5–5.1)
Sodium: 138 mEq/L (ref 135–145)
Total Bilirubin: 0.4 mg/dL (ref 0.2–1.2)
Total Protein: 7.7 g/dL (ref 6.0–8.3)

## 2020-05-30 LAB — CBC WITH DIFFERENTIAL/PLATELET
Basophils Absolute: 0 10*3/uL (ref 0.0–0.1)
Basophils Relative: 0.7 % (ref 0.0–3.0)
Eosinophils Absolute: 0.4 10*3/uL (ref 0.0–0.7)
Eosinophils Relative: 7.6 % — ABNORMAL HIGH (ref 0.0–5.0)
HCT: 38.4 % — ABNORMAL LOW (ref 39.0–52.0)
Hemoglobin: 13.1 g/dL (ref 13.0–17.0)
Lymphocytes Relative: 25.5 % (ref 12.0–46.0)
Lymphs Abs: 1.3 10*3/uL (ref 0.7–4.0)
MCHC: 34.2 g/dL (ref 30.0–36.0)
MCV: 94.2 fl (ref 78.0–100.0)
Monocytes Absolute: 0.6 10*3/uL (ref 0.1–1.0)
Monocytes Relative: 12.9 % — ABNORMAL HIGH (ref 3.0–12.0)
Neutro Abs: 2.6 10*3/uL (ref 1.4–7.7)
Neutrophils Relative %: 53.3 % (ref 43.0–77.0)
Platelets: 186 10*3/uL (ref 150.0–400.0)
RBC: 4.07 Mil/uL — ABNORMAL LOW (ref 4.22–5.81)
RDW: 14.5 % (ref 11.5–15.5)
WBC: 5 10*3/uL (ref 4.0–10.5)

## 2020-05-30 LAB — BRAIN NATRIURETIC PEPTIDE: Pro B Natriuretic peptide (BNP): 39 pg/mL (ref 0.0–100.0)

## 2020-05-30 MED ORDER — TRIAMCINOLONE ACETONIDE 0.1 % EX CREA: 1.0000 "application " | TOPICAL_CREAM | Freq: Two times a day (BID) | CUTANEOUS | 0 refills | Status: DC

## 2020-05-30 MED ORDER — FUROSEMIDE 20 MG PO TABS: 20.0000 mg | ORAL_TABLET | Freq: Every day | ORAL | 0 refills | Status: DC | PRN

## 2020-05-30 MED ORDER — KETOCONAZOLE 2 % EX SHAM: 1.0000 "application " | MEDICATED_SHAMPOO | CUTANEOUS | 0 refills | Status: DC

## 2020-05-30 NOTE — Progress Notes (Signed)
Johnathan [name] is a 87 y.o. male with the following history as recorded in EpicCare:  Patient Active Problem List   Diagnosis Date Noted  . Pulmonary emphysema ([place]) 04/26/2020  . COVID-19 02/25/2020  . Shingles 11/14/2019  . Raynaud's syndrome 11/14/2019  . Skin cancer 11/14/2019  . Left inguinal hernia s/p lap repair w mesh 05/06/2019 05/06/2019  . Recurrent right inguinal hernia s/p lap repair w mesh 05/06/2019 03/01/2019  . Diabetes mellitus ([place]) 03/01/2019  . Pneumonia 04/21/2014  . COPD with acute exacerbation ([place]) 04/21/2014    Current Outpatient Medications  Medication Sig Dispense Refill  . albuterol (PROVENTIL HFA;VENTOLIN HFA) 108 (90 Base) MCG/ACT inhaler Inhale 2 puffs into the lungs every 4 (four) hours as needed for wheezing or shortness of breath (or coughing). 1 Inhaler 0  . amLODipine (NORVASC) 10 MG tablet Take 1 tablet (10 mg total) by mouth daily. 30 tablet 2  . aspirin EC 81 MG tablet Take 81 mg by mouth daily.    . Cyanocobalamin (B-12) 1000 MCG CAPS Take 1 capsule by mouth daily.    . Flaxseed, Linseed, (FLAXSEED OIL) 1200 MG CAPS Take 1 capsule by mouth daily.    . Fluticasone-Umeclidin-Vilant (TRELEGY ELLIPTA) 100-62.5-25 MCG/INH AEPB Inhale 1 puff into the lungs daily. 120 each 2  . furosemide (LASIX) 20 MG tablet Take 1 tablet (20 mg total) by mouth daily as needed for edema. 30 tablet 0  . Ginkgo Biloba EXTR Take 1 tablet by mouth 2 (two) times daily.    Derrill Memo ON 06/01/2020] ketoconazole (NIZORAL) 2 % shampoo Apply 1 application topically 2 (two) times a week. 120 mL 0  . meloxicam (MOBIC) 15 MG tablet Take 1 tablet (15 mg total) by mouth daily. Use as needed for joint pain 30 tablet 3  . metFORMIN (GLUCOPHAGE) 500 MG tablet Take 250 mg by mouth daily.    . Misc Natural Products (HIMALAYAN GOJI PO) Take 1 tablet by mouth in the morning and at bedtime.     . Multiple Vitamin (MULTIVITAMIN WITH MINERALS) TABS tablet Take 1 tablet by mouth daily.    Marland Kitchen  omeprazole (PRILOSEC) 20 MG capsule Take 1 capsule (20 mg total) by mouth 2 (two) times daily. 180 capsule 1  . pravastatin (PRAVACHOL) 40 MG tablet Take 0.5 tablets (20 mg total) by mouth daily. Takes 1/2 pill daily 45 tablet 1  . primidone (MYSOLINE) 50 MG tablet Take 1 tablet (50 mg total) by mouth 2 (two) times daily. 180 tablet 1  . sildenafil (VIAGRA) 50 MG tablet Take 1 tablet (50 mg total) by mouth daily as needed for erectile dysfunction. 10 tablet 3  . terazosin (HYTRIN) 1 MG capsule Take 1 capsule (1 mg total) by mouth 2 (two) times daily. 180 capsule 1  . Tetrahydrozoline HCl (VISINE OP) Apply 1 drop to eye daily.    Marland Kitchen triamcinolone cream (KENALOG) 0.1 % Apply 1 application topically 2 (two) times daily. 30 g 0  . chlorpheniramine-HYDROcodone (TUSSIONEX) 10-8 MG/5ML SUER Take 5 mLs by mouth every 12 (twelve) hours. (Patient not taking: Reported on 05/30/2020) 140 mL 0   No current facility-administered medications for this visit.    Allergies: Patient has no known allergies.  Past Medical History:  Diagnosis Date  . COPD (chronic obstructive pulmonary disease) ([place])   . Diverticulosis   . DM type 2 (diabetes mellitus, type 2) ([place])   . GERD (gastroesophageal reflux disease)    uses prilosec   . Hypertension   .  Pneumonia 2018  . Raynaud's disease   . Shortness of breath dyspnea    WITH EXERTION  . Sinusitis   . Tremors of nervous system     Past Surgical History:  Procedure Laterality Date  . COLONOSCOPY  03/27/2005  . HERNIA REPAIR    . INGUINAL HERNIA REPAIR N/A 05/06/2019   Procedure: LAPAROSCOPIC RIGHT AND LEFT INGUINAL HERNIA(S) REPAIR;  Surgeon: Michael Boston, MD;  Location: [facility];  Service: General;  Laterality: N/A;    Family History  Problem Relation Age of Onset  . Dementia Brother   . Diabetes Brother   . Heart attack Father   . Dementia Father     Social History   Tobacco Use  . Smoking status: Former Smoker    Packs/day: 1.00     Years: 45.00    Pack years: 45.00    Types: Cigarettes    Quit date: 01/21/1994    Years since quitting: 26.3  . Smokeless tobacco: Never Used  Substance Use Topics  . Alcohol use: Yes    Comment: a drink daily with dinner    Subjective:   Concerned about weight gain/ swelling in legs; has had a difficult few months due to complications from COVID; admits that is not as active as he was in the past; does have active Rx for Furosemide given by cardiology but admits did not ever take it.   Worried about "itching" on his left elbow that has been troublesome recently- has been moisturizing and noticing some benefit; no specific rash noted; denies any new soaps, foods, detergents or medications.   Also concerned about "sores" on his scalp- would like to be referred to different dermatologist for 2nd opinion.    Objective:  Vitals:   05/30/20 0940  BP: (!) 120/58  Pulse: 92  Temp: 97.6 F (36.4 C)  TempSrc: Oral  SpO2: 98%  Weight: 162 lb (73.5 kg)  Height: 5' 10"  (1.778 m)    General: Well developed, well nourished, in no acute distress  Skin : Warm and dry.  Head: Normocephalic and atraumatic  Eyes: Sclera and conjunctiva clear; pupils round and reactive to light; extraocular movements intact  Ears: External normal; canals clear; tympanic membranes normal  Oropharynx: Pink, supple. No suspicious lesions  Neck: Supple without thyromegaly, adenopathy  Lungs: Respirations unlabored; clear to auscultation bilaterally without wheeze, rales, rhonchi; on oxygen;  CVS exam: normal rate and regular rhythm.  Extremities: bilateral edema, cyanosis, clubbing  Vessels: Symmetric bilaterally  Neurologic: Alert and oriented; speech intact; face symmetrical; in seated chair;  Assessment:  1. Pedal edema   2. Scalp lesion   3. Dermatitis     Plan:  1. Patient is planning to try and start exercising at [facility] as tolerated- agree this reasonable and appropriate; encourage to try and elevate  legs as possible; check CBC, CMP, BNP today; refill given on Lasix for him to use as needed if he decides he wants to use the medication; 2. Trial of Nizoral shampoo- refer to dermatology per patient request; 3. Rx for Triamcinolone cream- apply bid as needed;  Time spent 30+ minutes discussing concerns and formulating treatment plan   This visit occurred during the SARS-CoV-2 public health emergency.  Safety protocols were in place, including screening questions prior to the visit, additional usage of staff PPE, and extensive cleaning of exam room while observing appropriate contact time as indicated for disinfecting solutions.     No follow-ups on file.  Orders Placed This  Encounter  Procedures  . CBC with Differential/Platelet  . Comp Met (CMET)  . Brain natriuretic peptide  . Ambulatory referral to Dermatology    Referral Priority:   Routine    Referral Type:   Consultation    Referral Reason:   Specialty Services Required    Requested Specialty:   Dermatology    Number of Visits Requested:   1    Requested Prescriptions   Signed Prescriptions Disp Refills  . furosemide (LASIX) 20 MG tablet 30 tablet 0    Sig: Take 1 tablet (20 mg total) by mouth daily as needed for edema.  Marland Kitchen ketoconazole (NIZORAL) 2 % shampoo 120 mL 0    Sig: Apply 1 application topically 2 (two) times a week.  . triamcinolone cream (KENALOG) 0.1 % 30 g 0    Sig: Apply 1 application topically 2 (two) times daily.

## 2020-05-31 ENCOUNTER — Other Ambulatory Visit: Payer: Self-pay

## 2020-05-31 DIAGNOSIS — D3132 Benign neoplasm of left choroid: Secondary | ICD-10-CM | POA: Diagnosis not present

## 2020-05-31 DIAGNOSIS — H3509 Other intraretinal microvascular abnormalities: Secondary | ICD-10-CM | POA: Diagnosis not present

## 2020-05-31 DIAGNOSIS — H25813 Combined forms of age-related cataract, bilateral: Secondary | ICD-10-CM | POA: Diagnosis not present

## 2020-05-31 DIAGNOSIS — H0102A Squamous blepharitis right eye, upper and lower eyelids: Secondary | ICD-10-CM | POA: Diagnosis not present

## 2020-05-31 NOTE — Patient Outreach (Signed)
[facility]) Care Management  05/31/2020  ZONG MCQUARRIE 1932/10/08 [id number]   Telephone call to patient to engage for [facility] services.  No answer. HIPAA compliant voice message left.  Plan: RN CM will attempt patient again within 4 business days and send letter.  Jone Baseman, RN, MSN [place] Management Care Management Coordinator Direct Line [phone] Cell [phone] Toll Free: [phone]  Fax: [phone]

## 2020-06-01 ENCOUNTER — Other Ambulatory Visit: Payer: Self-pay

## 2020-06-01 NOTE — Patient Outreach (Signed)
[facility]) Care Management  [place]  06/01/2020   Johnathan [name] [birth year] [id number]  Subjective: Telephone call to patient for follow up.  He states he is doing good. Patient lives in the home with roommate.  He is independent with ADL's and still drives.   Medical history significant of COPD, HLD, HTN.  Patient is oxygen dependent.  Patient manages COPD with Trelegy. Rarely does he use his rescue inhaler.  Reviewed COPD action plan.   [facility] services and support. Patient is agreeable to [facility] telephonic outreach every 2-3 months for disease management support.   Objective:   Encounter Medications:  Outpatient Encounter Medications as of 06/01/2020  Medication Sig  . albuterol (PROVENTIL HFA;VENTOLIN HFA) 108 (90 Base) MCG/ACT inhaler Inhale 2 puffs into the lungs every 4 (four) hours as needed for wheezing or shortness of breath (or coughing).  Marland Kitchen aspirin EC 81 MG tablet Take 81 mg by mouth daily.  . Cyanocobalamin (B-12) 1000 MCG CAPS Take 1 capsule by mouth daily.  . Flaxseed, Linseed, (FLAXSEED OIL) 1200 MG CAPS Take 1 capsule by mouth daily.  . Fluticasone-Umeclidin-Vilant (TRELEGY ELLIPTA) 100-62.5-25 MCG/INH AEPB Inhale 1 puff into the lungs daily.  . furosemide (LASIX) 20 MG tablet Take 1 tablet (20 mg total) by mouth daily as needed for edema.  . Ginkgo Biloba EXTR Take 1 tablet by mouth 2 (two) times daily.  Marland Kitchen ketoconazole (NIZORAL) 2 % shampoo Apply 1 application topically 2 (two) times a week.  . meloxicam (MOBIC) 15 MG tablet Take 1 tablet (15 mg total) by mouth daily. Use as needed for joint pain  . metFORMIN (GLUCOPHAGE) 500 MG tablet Take 250 mg by mouth daily.  . Misc Natural Products (HIMALAYAN GOJI PO) Take 1 tablet by mouth in the morning and at bedtime.   . Multiple Vitamin (MULTIVITAMIN WITH MINERALS) TABS tablet Take 1 tablet by mouth daily.  Marland Kitchen omeprazole (PRILOSEC) 20 MG capsule Take 1 capsule (20 mg total) by mouth 2 (two) times  daily.  . pravastatin (PRAVACHOL) 40 MG tablet Take 0.5 tablets (20 mg total) by mouth daily. Takes 1/2 pill daily  . primidone (MYSOLINE) 50 MG tablet Take 1 tablet (50 mg total) by mouth 2 (two) times daily.  . sildenafil (VIAGRA) 50 MG tablet Take 1 tablet (50 mg total) by mouth daily as needed for erectile dysfunction.  Marland Kitchen terazosin (HYTRIN) 1 MG capsule Take 1 capsule (1 mg total) by mouth 2 (two) times daily.  . Tetrahydrozoline HCl (VISINE OP) Apply 1 drop to eye daily.  Marland Kitchen triamcinolone cream (KENALOG) 0.1 % Apply 1 application topically 2 (two) times daily.  Marland Kitchen amLODipine (NORVASC) 10 MG tablet Take 1 tablet (10 mg total) by mouth daily.  . chlorpheniramine-HYDROcodone (TUSSIONEX) 10-8 MG/5ML SUER Take 5 mLs by mouth every 12 (twelve) hours. (Patient not taking: Reported on 05/30/2020)   No facility-administered encounter medications on file as of 06/01/2020.    Functional Status:  In your present state of health, do you have any difficulty performing the following activities: 06/01/2020 02/25/2020  Hearing? Y N  Comment wears hearing aids -  Vision? N N  Difficulty concentrating or making decisions? N N  Walking or climbing stairs? N Y  Comment - secondary to cough and shortness of breath  Dressing or bathing? N N  Doing errands, shopping? N N  Preparing Food and eating ? N -  Using the Toilet? N -  In the past six months, have you accidently  leaked urine? N -  Do you have problems with loss of bowel control? N -  Managing your Medications? N -  Managing your Finances? N -  Housekeeping or managing your Housekeeping? N -  Some recent data might be hidden    Fall/Depression Screening: Fall Risk  06/01/2020 05/30/2020  Falls in the past year? 0 0  Number falls in past yr: - 0  Injury with Fall? - 0  Risk for fall due to : - No Fall Risks  Follow up - Falls evaluation completed   PHQ 2/9 Scores 06/01/2020 05/30/2020  PHQ - 2 Score 0 0    Assessment:  Goals Addressed             This Visit's Progress   . Track and Manage My Symptoms-COPD       Barriers: Knowledge Timeframe:  Long-Range Goal Priority:  High Start Date:     06/01/20                       Expected End Date:    01/20/21                   Follow Up Date 09/20/20  - follow rescue plan if symptoms flare-up - keep follow-up appointments    Why is this important?    Tracking your symptoms and other information about your health helps your doctor plan your care.   Write down the symptoms, the time of day, what you were doing and what medicine you are taking.   You will soon learn how to manage your symptoms.     Notes: 06/01/20 Continue with COPD regimen.         Plan: RN CM will provide ongoing education and support to patient through phone calls.   RN CM will send welcome packet with consent to patient.   RN CM will send initial barriers letter, assessment, and care plan to primary care physician.   RN CM will contact patient in the month of August. Follow-up:  Patient agrees to Care Plan and Follow-up.   Jone Baseman, RN, MSN [place] Management Care Management Coordinator Direct Line [phone] Cell [phone] Toll Free: [phone]  Fax: [phone]

## 2020-06-01 NOTE — Patient Instructions (Addendum)
Goals Addressed            This Visit's Progress   . Track and Manage My Symptoms-COPD       Barriers: Knowledge Timeframe:  Long-Range Goal Priority:  High Start Date:     06/01/20                       Expected End Date:    01/20/21                   Follow Up Date 09/20/20  - follow rescue plan if symptoms flare-up - keep follow-up appointments    Why is this important?    Tracking your symptoms and other information about your health helps your doctor plan your care.   Write down the symptoms, the time of day, what you were doing and what medicine you are taking.   You will soon learn how to manage your symptoms.     Notes: 06/01/20 Continue with COPD regimen.         COPD Action Plan A COPD action plan is a description of what to do when you have a flare (exacerbation) of chronic obstructive pulmonary disease (COPD). Your action plan is a color-coded plan that lists the symptoms that indicate whether your condition is under control and what actions to take.  If you have symptoms in the green zone, it means you are doing well that day.  If you have symptoms in the yellow zone, it means you are having a bad day or an exacerbation.  If you have symptoms in the red zone, you need urgent medical care. Follow the plan that you and your health care provider developed. Review your plan with your health care provider at each visit. Red zone Symptoms in this zone mean that you should get medical help right away. They include:  Feeling very short of breath, even when you are resting.  Not being able to do any activities because of poor breathing.  Not being able to sleep because of poor breathing.  Fever or shaking chills.  Feeling confused or very sleepy.  Chest pain.  Coughing up blood. If you have any of these symptoms, call emergency services (911 in the [place] or go to the nearest emergency room.   Yellow zone Symptoms in this zone mean that your condition may be  getting worse. They include:  Feeling more short of breath than usual.  Having less energy for daily activities than usual.  Phlegm or mucus that is thicker than usual.  Needing to use your rescue inhaler or nebulizer more often than usual.  More ankle swelling than usual.  Coughing more than usual.  Feeling like you have a chest cold.  Trouble sleeping due to COPD symptoms.  Decreased appetite.  COPD medicines not helping as much as usual. If you experience any "yellow" symptoms:  Keep taking your daily medicines as directed.  Use your quick-relief inhaler as told by your health care provider.  If you were prescribed steroid medicine to take by mouth (oral medicine), start taking it as told by your health care provider.  If you were prescribed an antibiotic medicine, start taking it as told by your health care provider. Do not stop taking the antibiotic even if you start to feel better.  Use oxygen as told by your health care provider.  Get more rest.  Do your pursed-lip breathing exercises.  Do not smoke. Avoid any irritants in the  air. If your signs and symptoms do not improve after taking these steps, call your health care provider right away.   Green zone Symptoms in this zone mean that you are doing well. They include:  Being able to do your usual activities and exercise.  Having the usual amount of coughing, including the same amount of phlegm or mucus.  Being able to sleep well.  Having a good appetite.   Where to find more information: You can find more information about COPD from:  American Lung Association, My COPD Action Plan: www.lung.org  COPD Foundation: www.copdfoundation.[place]: https://wilson-eaton.com/ Follow these instructions at home:  Continue taking your daily medicines as told by your health care provider.  Make sure you receive all the immunizations that your health care provider recommends, especially  the pneumococcal and influenza vaccines.  Wash your hands often with soap and water. Have family members wash their hands too. Regular hand washing can help prevent infections.  Follow your usual exercise and diet plan.  Avoid irritants in the air, such as smoke.  Do not use any products that contain nicotine or tobacco. These products include cigarettes, chewing tobacco, and vaping devices, such as e-cigarettes. If you need help quitting, ask your health care provider. Summary  A COPD action plan tells you what to do when you have a flare (exacerbation) of chronic obstructive pulmonary disease (COPD).  Follow each action plan for your symptoms. If you have any symptoms in the red zone, call emergency services (911 in the [place] or go to the nearest emergency room. This information is not intended to replace advice given to you by your health care provider. Make sure you discuss any questions you have with your health care provider. Document Revised: 11/16/2019 Document Reviewed: 11/16/2019 Elsevier Patient Education  2021 [employer].

## 2020-06-05 ENCOUNTER — Encounter: Payer: Self-pay | Admitting: Pulmonary Disease

## 2020-06-05 ENCOUNTER — Encounter (HOSPITAL_COMMUNITY): Payer: Self-pay

## 2020-06-05 ENCOUNTER — Other Ambulatory Visit: Payer: Self-pay

## 2020-06-05 ENCOUNTER — Telehealth (HOSPITAL_COMMUNITY): Payer: Self-pay | Admitting: *Deleted

## 2020-06-05 ENCOUNTER — Ambulatory Visit (INDEPENDENT_AMBULATORY_CARE_PROVIDER_SITE_OTHER): Payer: Medicare HMO | Admitting: Pulmonary Disease

## 2020-06-05 ENCOUNTER — Telehealth (HOSPITAL_COMMUNITY): Payer: Self-pay

## 2020-06-05 VITALS — BP 128/62 | HR 71 | Temp 97.9°F | Ht 69.0 in | Wt 165.2 lb

## 2020-06-05 DIAGNOSIS — J849 Interstitial pulmonary disease, unspecified: Secondary | ICD-10-CM

## 2020-06-05 NOTE — Telephone Encounter (Signed)
Pt returned call and is interested in pulmonary rehab.  Reviewed insurance benefits.  Johnathan [name] is aware of the waitlist and will anticipate a call for scheduling when able. Reviewed with pt exercise guidelines along with oxygen safety. Verbalized understanding.  [name], BSN Cardiac and Training and development officer

## 2020-06-05 NOTE — Patient Instructions (Signed)
Your studies show combination of COPD and asthma.  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

## 2020-06-05 NOTE — Telephone Encounter (Signed)
Pt insurance is active and benefits verified through [facility]. Co-pay $0.00, DED $0.00/$0.00 met, out of pocket $3,450.00/$0.00 met, co-insurance 0%. No pre-authorization required. Vern L./[employer], 05/31/20 @ 407PM, VXB#[id number]  Will contact patient to see if he is interested in the Pulmonary Rehab Program.

## 2020-06-05 NOTE — Telephone Encounter (Signed)
Attempted to call patient in regards to Pulmonary Rehab - LM on VM Mailed letter 

## 2020-06-05 NOTE — Progress Notes (Signed)
Johnathan [name]    [id number]    09-Jun-1932  Primary Care Physician:Copland, Gay Filler, MD  Referring Physician: Darreld Mclean, MD [address] [place],  [place] [address]  Chief complaint: Follow-up for asthma, COPD, post COVID-8  HPI: 87 year old ex-smoker with history of emphysema [no PFTs on record], Raynaud's syndrome Maintained on Symbicort for many years  Hospitalized for COVID-19 in early February in spite of getting the booster vaccine. He was treated with IV remdesivir, steroids, discharged on supplemental oxygen Post discharge he continues to have persistent dyspnea on exertion, hypoxia whenever he stops oxygen.  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  No feather pillows or comforter. Smoking history: 45-pack-year smoker.  Quit in the 1990s Travel history: No significant travel history Relevant family history: Father had COPD.  He was a smoker.  Interim history: Advair changed to Trelegy.  He feels that this is working better for him Continues to have dyspnea on exertion  Feels that his 5 to 10 pound weight gain over the past few months is mainly contributing to symptoms.  Outpatient Encounter Medications as of 06/05/2020  Medication Sig  . albuterol (PROVENTIL HFA;VENTOLIN HFA) 108 (90 Base) MCG/ACT inhaler Inhale 2 puffs into the lungs every 4 (four) hours as needed for wheezing or shortness of breath (or coughing).  Marland Kitchen amLODipine (NORVASC) 10 MG tablet Take 1 tablet (10 mg total) by mouth daily.  Marland Kitchen aspirin EC 81 MG tablet Take 81 mg by mouth daily.  . chlorpheniramine-HYDROcodone (TUSSIONEX) 10-8 MG/5ML SUER Take 5 mLs by mouth every 12 (twelve) hours.  . Cyanocobalamin (B-12) 1000 MCG CAPS Take 1 capsule by mouth daily.  . Flaxseed,  Linseed, (FLAXSEED OIL) 1200 MG CAPS Take 1 capsule by mouth daily.  . Fluticasone-Umeclidin-Vilant (TRELEGY ELLIPTA) 100-62.5-25 MCG/INH AEPB Inhale 1 puff into the lungs daily.  . furosemide (LASIX) 20 MG tablet Take 1 tablet (20 mg total) by mouth daily as needed for edema.  . Ginkgo Biloba EXTR Take 1 tablet by mouth 2 (two) times daily.  Marland Kitchen ketoconazole (NIZORAL) 2 % shampoo Apply 1 application topically 2 (two) times a week.  . meloxicam (MOBIC) 15 MG tablet Take 1 tablet (15 mg total) by mouth daily. Use as needed for joint pain  . metFORMIN (GLUCOPHAGE) 500 MG tablet Take 250 mg by mouth daily.  . Misc Natural Products (HIMALAYAN GOJI PO) Take 1 tablet by mouth in the morning and at bedtime.   . Multiple Vitamin (MULTIVITAMIN WITH MINERALS) TABS tablet Take 1 tablet by mouth daily.  Marland Kitchen omeprazole (PRILOSEC) 20 MG capsule Take 1 capsule (20 mg total) by mouth 2 (two) times daily.  . pravastatin (PRAVACHOL) 40 MG tablet Take 0.5 tablets (20 mg total) by mouth daily. Takes 1/2 pill daily  . primidone (MYSOLINE) 50 MG tablet Take 1 tablet (50 mg total) by mouth 2 (two) times daily.  . sildenafil (VIAGRA) 50 MG tablet Take 1 tablet (50 mg total) by mouth daily as needed for erectile dysfunction.  Marland Kitchen terazosin (HYTRIN) 1 MG capsule Take 1 capsule (1 mg total) by mouth 2 (two) times daily.  . Tetrahydrozoline HCl (VISINE OP) Apply 1 drop to eye daily.  Marland Kitchen triamcinolone cream (KENALOG) 0.1 % Apply 1 application topically 2 (two) times daily.  No facility-administered encounter medications on file as of 06/05/2020.   Physical Exam: Blood pressure 128/62, pulse 71, temperature 97.9 F (36.6 C), temperature source Temporal, height 5\' 9"  (1.753 m), weight 165 lb 3.2 oz (74.9 kg), SpO2 95 %. Gen:      No acute distress HEENT:  EOMI, sclera anicteric Neck:     No masses; no thyromegaly Lungs:   Diminished breath sounds CV:         Regular rate and rhythm; no murmurs Abd:      + bowel sounds; soft,  non-tender; no palpable masses, no distension Ext:    No edema; adequate peripheral perfusion Skin:      Warm and dry; no rash Neuro: alert and oriented x 3 Psych: normal mood and affect  Data Reviewed: Imaging: Chest x-ray 02/20/2020-hyperinflation, no acute process Chest x-ray 02/25/2020-emphysema, mild left base opacity Chest x-ray 03/30/2020-bilateral infiltrates. CT high-resolution 04/28/2020- emphysema, peripheral sublenticular groundglass densities, calcified granuloma.  Indeterminate pattern I have reviewed the images personally.  PFTs: 05/26/2020 FVC 2.41 [6%], FEV1 1.23 [48%], F/F 51, TLC 5.03 [73%], DLCO 7.92 [34%] Severe obstruction, severe diffusion defect, mild restriction  Labs: CBC 02/29/2020-WBC 5.3, eos 0% CBC 05/30/2020-WBC 5, eos 7.6%, absolute eosinophil count 380 IgE 04/26/2020-858  ANA, CCP, rheumatoid factor 04/26/2020- Negative  Assessment:  Asthma, COPD overlap syndrome PFTs reviewed with severe obstruction. Doing better with Trelegy instead of advair Continue supplemental oxygen  Post COVID-19 He had been asymptomatic and active prior to COVID-19 but now short of breath, requiring supplemental oxygen CT shows indeterminate pattern interstitial lung disease  Will benefit from exercise therapy and weight loss Referral to pulmonary rehab Continue supplemental oxygen for now  Raynaud's syndrome He has history of Raynaud's syndrome and is taking sildenafil ANA is negative.  Plan/Recommendations: Continue Trelegy Pulmonary rehab  Marshell Garfinkel MD [facility] 06/05/2020, 10:23 AM  CC: Copland, Gay Filler, MD

## 2020-06-08 ENCOUNTER — Other Ambulatory Visit: Payer: Self-pay

## 2020-06-08 MED ORDER — MELOXICAM 15 MG PO TABS: 15.0000 mg | ORAL_TABLET | Freq: Every day | ORAL | 3 refills | Status: DC

## 2020-06-13 DIAGNOSIS — Z0100 Encounter for examination of eyes and vision without abnormal findings: Secondary | ICD-10-CM | POA: Diagnosis not present

## 2020-06-21 DIAGNOSIS — U071 COVID-19: Secondary | ICD-10-CM | POA: Diagnosis not present

## 2020-06-26 ENCOUNTER — Other Ambulatory Visit: Payer: Self-pay | Admitting: Family Medicine

## 2020-06-26 MED ORDER — AMLODIPINE BESYLATE 10 MG PO TABS: 10.0000 mg | ORAL_TABLET | Freq: Every day | ORAL | 2 refills | Status: DC

## 2020-06-26 NOTE — Telephone Encounter (Signed)
Pt states he is needing his rx for amlodipine 10 mg, states he tried to contact [facility] mail delivery but had a hard time getting through the automated system. Pt would like rx sent to [facility] mail delivery. Pt states to please call him if there is questions or concerns.

## 2020-06-26 NOTE — Telephone Encounter (Signed)
Patients amlodipine is written by Dr. Pietro Cassis. Prescribed in ER.  I have pended refill request

## 2020-06-30 NOTE — Progress Notes (Signed)
Johnathan [name] [address], [place], [place] [address] [phone]  Date:  07/03/2020   Name:  Johnathan [name]   DOB:  04-Jul-1932   MRN:  [id number]  PCP:  Darreld Mclean, MD    Chief Complaint: Edema (In the legs) and righ skin tag (Been there for months )   History of Present Illness:  Johnathan [name] is a 87 y.o. very pleasant male patient who presents with the following:  Mace is seen today for follow-up visit-history of well-controlled diabetes, raynauds syndrome and COPD.  He had done very well for age until he had COVID-68 in February 2022.  Most recent visit with myself was in April, he also saw my partner Jodi Mourning for concern of ankle edema on May 10-at that time he is concerned about weight gain/swelling in legs.  Mickel Baas did an appropriate lab evaluation including BNP which was normal, prescribed Lasix to use as needed  He was seen by pulmonology, Dr.Praveen on May 16-he has been noted to have indeterminate pattern interstitial lung disease on CT He was noted to have improvement with Trelegy as opposed to Advair- he does feel like this is working better He still requires supplemental oxygen He notes that he still is using 2L of oxygen- he has not been able to wean this really   Most recent labs on chart from May 10  He is using lasix - rx is for 20 but pt states her was given 10mg  by the pharamcy-  He is taking "1 or 2 a day" He notes that he still has some in his bottle We wonder if the pharmacy gave him 60 of 10 mg tabs - he will check and tell me for sure when he gets home He is not sure how much the lasix is really helping and is ok with stopping it   He tries to walk when he can- but does not do a whole lot of walking as he is nearly 87 yo     Patient Active Problem List   Diagnosis Date Noted   Pulmonary emphysema ([place]) 04/26/2020   COVID-19 02/25/2020   Shingles 11/14/2019   Raynaud's syndrome  11/14/2019   Skin cancer 11/14/2019   Left inguinal hernia s/p lap repair w mesh 05/06/2019 05/06/2019   Recurrent right inguinal hernia s/p lap repair w mesh 05/06/2019 03/01/2019   Diabetes mellitus ([place]) 03/01/2019   Pneumonia 04/21/2014   COPD with acute exacerbation ([place]) 04/21/2014    Past Medical History:  Diagnosis Date   COPD (chronic obstructive pulmonary disease) ([place])    Diverticulosis    DM type 2 (diabetes mellitus, type 2) ([place])    GERD (gastroesophageal reflux disease)    uses prilosec    Hypertension    Pneumonia 2018   Raynaud's disease    Shortness of breath dyspnea    WITH EXERTION   Sinusitis    Tremors of nervous system     Past Surgical History:  Procedure Laterality Date   COLONOSCOPY  03/27/2005   HERNIA REPAIR     INGUINAL HERNIA REPAIR N/A 05/06/2019   Procedure: LAPAROSCOPIC RIGHT AND LEFT INGUINAL HERNIA(S) REPAIR;  Surgeon: Michael Boston, MD;  Location: [facility];  Service: General;  Laterality: N/A;    Social History   Tobacco Use   Smoking status: Former    Packs/day: 1.00    Years: 45.00    Pack years: 45.00  Types: Cigarettes    Quit date: 01/21/1994    Years since quitting: 26.4   Smokeless tobacco: Never  Substance Use Topics   Alcohol use: Yes    Comment: a drink daily with dinner   Drug use: No    Family History  Problem Relation Age of Onset   Dementia Brother    Diabetes Brother    Heart attack Father    Dementia Father     No Known Allergies  Medication list has been reviewed and updated.  Current Outpatient Medications on File Prior to Visit  Medication Sig Dispense Refill   albuterol (PROVENTIL HFA;VENTOLIN HFA) 108 (90 Base) MCG/ACT inhaler Inhale 2 puffs into the lungs every 4 (four) hours as needed for wheezing or shortness of breath (or coughing). 1 Inhaler 0   amLODipine (NORVASC) 10 MG tablet Take 1 tablet (10 mg total) by mouth daily. 90 tablet 2   aspirin EC 81 MG tablet Take 81 mg by  mouth daily.     chlorpheniramine-HYDROcodone (TUSSIONEX) 10-8 MG/5ML SUER Take 5 mLs by mouth every 12 (twelve) hours. 140 mL 0   Cyanocobalamin (B-12) 1000 MCG CAPS Take 1 capsule by mouth daily.     Flaxseed, Linseed, (FLAXSEED OIL) 1200 MG CAPS Take 1 capsule by mouth daily.     Fluticasone-Umeclidin-Vilant (TRELEGY ELLIPTA) 100-62.5-25 MCG/INH AEPB Inhale 1 puff into the lungs daily. 120 each 2   furosemide (LASIX) 20 MG tablet Take 1 tablet (20 mg total) by mouth daily as needed for edema. 30 tablet 0   Ginkgo Biloba EXTR Take 1 tablet by mouth 2 (two) times daily.     ketoconazole (NIZORAL) 2 % shampoo Apply 1 application topically 2 (two) times a week. 120 mL 0   meloxicam (MOBIC) 15 MG tablet Take 1 tablet (15 mg total) by mouth daily. Use as needed for joint pain 30 tablet 3   metFORMIN (GLUCOPHAGE) 500 MG tablet Take 250 mg by mouth daily.     Misc Natural Products (HIMALAYAN GOJI PO) Take 1 tablet by mouth in the morning and at bedtime.      Multiple Vitamin (MULTIVITAMIN WITH MINERALS) TABS tablet Take 1 tablet by mouth daily.     omeprazole (PRILOSEC) 20 MG capsule Take 1 capsule (20 mg total) by mouth 2 (two) times daily. 180 capsule 1   pravastatin (PRAVACHOL) 40 MG tablet Take 0.5 tablets (20 mg total) by mouth daily. Takes 1/2 pill daily 45 tablet 1   primidone (MYSOLINE) 50 MG tablet Take 1 tablet (50 mg total) by mouth 2 (two) times daily. 180 tablet 1   sildenafil (VIAGRA) 50 MG tablet Take 1 tablet (50 mg total) by mouth daily as needed for erectile dysfunction. 10 tablet 3   terazosin (HYTRIN) 1 MG capsule Take 1 capsule (1 mg total) by mouth 2 (two) times daily. 180 capsule 1   Tetrahydrozoline HCl (VISINE OP) Apply 1 drop to eye daily.     triamcinolone cream (KENALOG) 0.1 % Apply 1 application topically 2 (two) times daily. 30 g 0   No current facility-administered medications on file prior to visit.    Review of Systems:  As per HPI- otherwise  negative.   Physical Examination: Vitals:   07/03/20 1530  BP: (!) 110/56  Pulse: 73  Temp: 97.8 F (36.6 C)  SpO2: 93%   Vitals:   07/03/20 1530  Weight: 158 lb (71.7 kg)  Height: 5\' 10"  (1.778 m)   Body mass index is 22.67 kg/m. Ideal  Body Weight: Weight in (lb) to have BMI = 25: 173.9  GEN: no acute distress. Look well, normal weight  HEENT: Atraumatic, Normocephalic.  He does have a skin tag on his right upper lid Ears and Nose: No external deformity. CV: RRR, No M/G/R. No JVD. No thrill. No extra heart sounds. PULM: CTA B, no wheezes, crackles, rhonchi. No retractions. No resp. distress. No accessory muscle use. ABD: S, NT, ND, +BS. No rebound. No HSM. EXTR: No c/c/.  He has trace edema of both LE  PSYCH: Normally interactive. Conversant.  Using his oxygen at 2L right now     BP Readings from Last 3 Encounters:  07/03/20 (!) 110/56  06/05/20 128/62  05/30/20 (!) 120/58      Assessment and Plan: Skin tag - Plan: Ambulatory referral to Ophthalmology  Bilateral lower extremity edema  Pulmonary fibrosis ([place])  Essential hypertension  Hypotension due to drugs  He plans to stop lasix as not really helping.  Try compression for swelling Also, 10 mg of amlodipine may contribute to swelling.  He will drop to 5 mg and let me know how his BP does Referral to optho for skin tag on lid Doing ok on home oxygen, sats normal  He plans to see me in about a month to check on his progress  This visit occurred during the SARS-CoV-2 public health emergency.  Safety protocols were in place, including screening questions prior to the visit, additional usage of staff PPE, and extensive cleaning of exam room while observing appropriate contact time as indicated for disinfecting solutions.   Signed Lamar Blinks, MD

## 2020-07-03 ENCOUNTER — Ambulatory Visit (INDEPENDENT_AMBULATORY_CARE_PROVIDER_SITE_OTHER): Payer: Medicare HMO | Admitting: Family Medicine

## 2020-07-03 ENCOUNTER — Other Ambulatory Visit: Payer: Self-pay

## 2020-07-03 VITALS — BP 110/56 | HR 73 | Temp 97.8°F | Ht 70.0 in | Wt 158.0 lb

## 2020-07-03 DIAGNOSIS — R600 Localized edema: Secondary | ICD-10-CM

## 2020-07-03 DIAGNOSIS — L918 Other hypertrophic disorders of the skin: Secondary | ICD-10-CM | POA: Diagnosis not present

## 2020-07-03 DIAGNOSIS — I10 Essential (primary) hypertension: Secondary | ICD-10-CM

## 2020-07-03 DIAGNOSIS — J8410 Pulmonary fibrosis, unspecified: Secondary | ICD-10-CM | POA: Diagnosis not present

## 2020-07-03 DIAGNOSIS — I952 Hypotension due to drugs: Secondary | ICD-10-CM | POA: Diagnosis not present

## 2020-07-03 NOTE — Patient Instructions (Addendum)
Your swelling does NOT seem to be due to heart failure which is a good thing.  I am not sure how much the furosemide is helping Compression socks or stockings may help with swelling  I also might drop your amlodipine to 5 mg a day (1/2 tablet) as your BP is a bit low If your BP is going too high- over 145/90- please let me know   We will set you up with an ophthalmologist to look at your skin tag on your eye

## 2020-07-07 ENCOUNTER — Telehealth: Payer: Self-pay | Admitting: Family Medicine

## 2020-07-07 ENCOUNTER — Telehealth (HOSPITAL_COMMUNITY): Payer: Self-pay

## 2020-07-07 NOTE — Telephone Encounter (Signed)
The patient would like a copy of his COVID shot record mailed to him.

## 2020-07-07 NOTE — Telephone Encounter (Signed)
Called patient to see if he was interested in participating in the Pulmonary Rehab Program. Patient stated yes. Patient will come in for orientation on 08/04/20 @ 130PM and will attend the 1:15PM exercise class   Mailed package.

## 2020-07-10 NOTE — Telephone Encounter (Signed)
Records have been mailed to patient.

## 2020-07-12 ENCOUNTER — Other Ambulatory Visit: Payer: Self-pay | Admitting: *Deleted

## 2020-07-12 MED ORDER — MELOXICAM 15 MG PO TABS: 15.0000 mg | ORAL_TABLET | Freq: Every day | ORAL | 0 refills | Status: DC

## 2020-07-12 MED ORDER — SILDENAFIL CITRATE 50 MG PO TABS: 50.0000 mg | ORAL_TABLET | Freq: Every day | ORAL | 3 refills | Status: DC | PRN

## 2020-07-20 ENCOUNTER — Telehealth: Payer: Self-pay

## 2020-07-20 NOTE — Telephone Encounter (Signed)
Prior authorization for patient's viagra initiated via cover my meds.   KEY: [id number]  Waiting on determination.

## 2020-07-21 DIAGNOSIS — U071 COVID-19: Secondary | ICD-10-CM | POA: Diagnosis not present

## 2020-07-24 NOTE — Progress Notes (Addendum)
[facility] [address] [place], [place] [address] [phone]  Date:  07/31/2020   Name:  BLAZE NYLUND   DOB:  [birth year]   MRN:  [id number]  PCP:  Darreld Mclean, MD    Chief Complaint: Medical Management of Chronic Issues (F/u visit)   History of Present Illness:  CORDARRIUS COAD is a 87 y.o. very pleasant male patient who presents with the following:  Short term follow-up visit today Lasts seen by myself about one month ago - history of well-controlled diabetes, raynauds syndrome and COPD.  He had done very well for age until he had COVID-39 in February 2022.  He then developed nonspecific interstitial lung disease and is on oxygen - seen by Dr [name] in May   Last month he was having some LE edema- lasix was not really helping and his BNP was normal I recommended he use compression and reduce amlodipine from 10 to 5 mg  He has continued taking 10 mg for the time being He is hoping to start pulmonary rehab soon- he has an intake appt this week   He has a HCPOA for [place] to put in his paperwork   He brings in some BP and pulse readings- 108- 149/ 62- 79  He gets tired with walking- we hope that pulmonary rehab will help him with his endurance  He is frustrated that he gained a little weight when he was ill last year- I reassured him that his weight is still ok and in fact he is probably better off with a slight weight loss buffer in case of illness  He did all 4 covid shots - does not have dates of most recent on him    BP Readings from Last 3 Encounters:  07/31/20 112/60  07/03/20 (!) 110/56  06/05/20 128/62    Patient Active Problem List   Diagnosis Date Noted   Pulmonary emphysema ([place]) 04/26/2020   COVID-19 02/25/2020   Shingles 11/14/2019   Raynaud's syndrome 11/14/2019   Skin cancer 11/14/2019   Left inguinal hernia s/p lap repair w mesh 05/06/2019 05/06/2019   Recurrent right inguinal hernia s/p  lap repair w mesh 05/06/2019 03/01/2019   Diabetes mellitus ([place]) 03/01/2019   Pneumonia 04/21/2014   COPD with acute exacerbation ([place]) 04/21/2014    Past Medical History:  Diagnosis Date   COPD (chronic obstructive pulmonary disease) ([place])    Diverticulosis    DM type 2 (diabetes mellitus, type 2) ([place])    GERD (gastroesophageal reflux disease)    uses prilosec    Hypertension    Pneumonia 2018   Raynaud's disease    Shortness of breath dyspnea    WITH EXERTION   Sinusitis    Tremors of nervous system     Past Surgical History:  Procedure Laterality Date   COLONOSCOPY  03/27/2005   HERNIA REPAIR     INGUINAL HERNIA REPAIR N/A 05/06/2019   Procedure: LAPAROSCOPIC RIGHT AND LEFT INGUINAL HERNIA(S) REPAIR;  Surgeon: Michael Boston, MD;  Location: [facility];  Service: General;  Laterality: N/A;    Social History   Tobacco Use   Smoking status: Former    Packs/day: 1.00    Years: 45.00    Pack years: 45.00    Types: Cigarettes    Quit date: 01/21/1994    Years since quitting: 26.5   Smokeless tobacco: Never  Substance Use Topics   Alcohol use:  Yes    Comment: a drink daily with dinner   Drug use: No    Family History  Problem Relation Age of Onset   Dementia Brother    Diabetes Brother    Heart attack Father    Dementia Father     No Known Allergies  Medication list has been reviewed and updated.  Current Outpatient Medications on File Prior to Visit  Medication Sig Dispense Refill   albuterol (PROVENTIL HFA;VENTOLIN HFA) 108 (90 Base) MCG/ACT inhaler Inhale 2 puffs into the lungs every 4 (four) hours as needed for wheezing or shortness of breath (or coughing). 1 Inhaler 0   amLODipine (NORVASC) 10 MG tablet Take 1 tablet (10 mg total) by mouth daily. 90 tablet 2   aspirin EC 81 MG tablet Take 81 mg by mouth daily.     chlorpheniramine-HYDROcodone (TUSSIONEX) 10-8 MG/5ML SUER Take 5 mLs by mouth every 12 (twelve) hours. 140 mL 0   Cyanocobalamin  (B-12) 1000 MCG CAPS Take 1 capsule by mouth daily.     Flaxseed, Linseed, (FLAXSEED OIL) 1200 MG CAPS Take 1 capsule by mouth daily.     Fluticasone-Umeclidin-Vilant (TRELEGY ELLIPTA) 100-62.5-25 MCG/INH AEPB Inhale 1 puff into the lungs daily. 120 each 2   furosemide (LASIX) 20 MG tablet Take 1 tablet (20 mg total) by mouth daily as needed for edema. 30 tablet 0   Ginkgo Biloba EXTR Take 1 tablet by mouth 2 (two) times daily.     ketoconazole (NIZORAL) 2 % shampoo Apply 1 application topically 2 (two) times a week. 120 mL 0   meloxicam (MOBIC) 15 MG tablet Take 1 tablet (15 mg total) by mouth daily. Use as needed for joint pain 90 tablet 0   metFORMIN (GLUCOPHAGE) 500 MG tablet Take 250 mg by mouth daily.     Misc Natural Products (HIMALAYAN GOJI PO) Take 1 tablet by mouth in the morning and at bedtime.      Multiple Vitamin (MULTIVITAMIN WITH MINERALS) TABS tablet Take 1 tablet by mouth daily.     omeprazole (PRILOSEC) 20 MG capsule Take 1 capsule (20 mg total) by mouth 2 (two) times daily. 180 capsule 1   pravastatin (PRAVACHOL) 40 MG tablet Take 0.5 tablets (20 mg total) by mouth daily. Takes 1/2 pill daily 45 tablet 1   primidone (MYSOLINE) 50 MG tablet Take 1 tablet (50 mg total) by mouth 2 (two) times daily. 180 tablet 1   sildenafil (VIAGRA) 50 MG tablet Take 1 tablet (50 mg total) by mouth daily as needed for erectile dysfunction. 10 tablet 3   terazosin (HYTRIN) 1 MG capsule Take 1 capsule (1 mg total) by mouth 2 (two) times daily. 180 capsule 1   Tetrahydrozoline HCl (VISINE OP) Apply 1 drop to eye daily.     triamcinolone cream (KENALOG) 0.1 % Apply 1 application topically 2 (two) times daily. 30 g 0   No current facility-administered medications on file prior to visit.    Review of Systems:  As per HPI- otherwise negative.   Physical Examination: Vitals:   07/31/20 1327  BP: 112/60  Pulse: 77  Temp: 97.8 F (36.6 C)  SpO2: 94%   Vitals:   07/31/20 1327  Weight: 166  lb 3.2 oz (75.4 kg)  Height: 5\' 10"  (1.778 m)   Body mass index is 23.85 kg/m. Ideal Body Weight: Weight in (lb) to have BMI = 25: 173.9  GEN: no acute distress. Normal weight  HEENT: Atraumatic, Normocephalic.  Ears and Nose: No  external deformity. CV: RRR, No M/G/R. No JVD. No thrill. No extra heart sounds. PULM: CTA B, no wheezes, crackles, rhonchi. No retractions. No resp. distress. No accessory muscle use. ABD: S, NT, ND, +BS. No rebound. No HSM. EXTR: No c/c.  Minimal LE edema is present bilaterally  PSYCH: Normally interactive. Conversant.  Wearing oxygen at 2L [place]- he has a portable concentrator  Small wound right hand -healing well, no additional wound care is needed but will update tetanus Assessment and Plan: Essential hypertension  Bilateral lower extremity edema  Pulmonary fibrosis (H[facility])  Open wound of right upper arm, initial encounter - Plan: Td vaccine greater than or equal to 7yo preservative free IM  Type 2 diabetes mellitus without complication, without long-term current use of insulin (H[facility]) - Plan: Basic metabolic panel, Hemoglobin A1c  Blood pressure continues to be under reasonable control on current medications He does have minimal bilateral lower extremity edema, but not enough to be bothersome He plans to start pulmonary rehab and is currently doing well with home oxygen Update tetanus Will plan further follow- up pending labs.  This visit occurred during the SARS-CoV-2 public health emergency.  Safety protocols were in place, including screening questions prior to the visit, additional usage of staff PPE, and extensive cleaning of exam room while observing appropriate contact time as indicated for disinfecting solutions.   Signed Lamar Blinks, MD  Addendum 7/12 Received his lab results as below, message to patient  Results for orders placed or performed in visit on 56/70/14  Basic metabolic panel  Result Value Ref Range   Sodium 136 135 - 145  mEq/L   Potassium 4.8 3.5 - 5.1 mEq/L   Chloride 101 96 - 112 mEq/L   CO2 30 19 - 32 mEq/L   Glucose, Bld 103 (H) 70 - 99 mg/dL   BUN 28 (H) 6 - 23 mg/dL   Creatinine, Ser 1.01 0.40 - 1.50 mg/dL   GFR 67.67 >60.00 mL/min   Calcium 9.4 8.4 - 10.5 mg/dL  Hemoglobin A1c  Result Value Ref Range   Hgb A1c MFr Bld 6.7 (H) 4.6 - 6.5 %

## 2020-07-31 ENCOUNTER — Ambulatory Visit (INDEPENDENT_AMBULATORY_CARE_PROVIDER_SITE_OTHER): Payer: Medicare HMO | Admitting: Family Medicine

## 2020-07-31 ENCOUNTER — Other Ambulatory Visit: Payer: Self-pay

## 2020-07-31 VITALS — BP 112/60 | HR 77 | Temp 97.8°F | Ht 70.0 in | Wt 166.2 lb

## 2020-07-31 DIAGNOSIS — R600 Localized edema: Secondary | ICD-10-CM

## 2020-07-31 DIAGNOSIS — S41101A Unspecified open wound of right upper arm, initial encounter: Secondary | ICD-10-CM

## 2020-07-31 DIAGNOSIS — Z23 Encounter for immunization: Secondary | ICD-10-CM | POA: Diagnosis not present

## 2020-07-31 DIAGNOSIS — I10 Essential (primary) hypertension: Secondary | ICD-10-CM | POA: Diagnosis not present

## 2020-07-31 DIAGNOSIS — J8410 Pulmonary fibrosis, unspecified: Secondary | ICD-10-CM | POA: Diagnosis not present

## 2020-07-31 DIAGNOSIS — E119 Type 2 diabetes mellitus without complications: Secondary | ICD-10-CM

## 2020-07-31 NOTE — Patient Instructions (Signed)
It was great to see you again today, I will be in touch with your labs soon as possible.  You got a tetanus booster today, this is good for about 10 years.  Assuming all is well, please see me in 4 months for follow-up. Best of luck with pulmonary rehab!   I think you are okay to take either 5 or 10 mg amlodipine, let me know if any questions

## 2020-08-01 ENCOUNTER — Encounter: Payer: Self-pay | Admitting: Family Medicine

## 2020-08-01 LAB — BASIC METABOLIC PANEL
BUN: 28 mg/dL — ABNORMAL HIGH (ref 6–23)
CO2: 30 mEq/L (ref 19–32)
Calcium: 9.4 mg/dL (ref 8.4–10.5)
Chloride: 101 mEq/L (ref 96–112)
Creatinine, Ser: 1.01 mg/dL (ref 0.40–1.50)
GFR: 67.67 mL/min (ref >60.00)
Glucose, Bld: 103 mg/dL — ABNORMAL HIGH (ref 70–99)
Potassium: 4.8 mEq/L (ref 3.5–5.1)
Sodium: 136 mEq/L (ref 135–145)

## 2020-08-01 LAB — HEMOGLOBIN A1C: Hgb A1c MFr Bld: 6.7 % — ABNORMAL HIGH (ref 4.6–6.5)

## 2020-08-03 ENCOUNTER — Telehealth (HOSPITAL_COMMUNITY): Payer: Self-pay

## 2020-08-03 NOTE — Telephone Encounter (Signed)
Called pt to remind him of his appointment with pulmonary rehab tomorrow 08/04/20 at 1330. Pt confirmed. Instructed him to wear a mask and closed toe/heel shoes. Also gave pt verbal directions to get to our department. Pt verbalized understanding.

## 2020-08-04 ENCOUNTER — Encounter (HOSPITAL_COMMUNITY): Payer: Self-pay

## 2020-08-04 ENCOUNTER — Other Ambulatory Visit: Payer: Self-pay

## 2020-08-04 ENCOUNTER — Encounter (HOSPITAL_COMMUNITY)
Admission: RE | Admit: 2020-08-04 | Discharge: 2020-08-04 | Disposition: A | Discharge status: Home / Self Care | Payer: Medicare HMO | Source: Ambulatory Visit | Attending: Pulmonary Disease | Admitting: Pulmonary Disease

## 2020-08-04 VITALS — BP 144/62 | HR 71 | Ht 68.0 in | Wt 163.1 lb

## 2020-08-04 DIAGNOSIS — U099 Post covid-19 condition, unspecified: Secondary | ICD-10-CM | POA: Insufficient documentation

## 2020-08-04 DIAGNOSIS — J849 Interstitial pulmonary disease, unspecified: Secondary | ICD-10-CM | POA: Diagnosis not present

## 2020-08-04 NOTE — Progress Notes (Signed)
Pulmonary Individual Treatment Plan  Patient Details  Name: Johnathan [name] MRN: [id number] Date of Birth: Aug 03, 1932 Referring Provider:   [name] Pulmonary Rehab Walk Test from 08/04/2020 in [place]  Referring Provider Dr. [name]       Initial Encounter Date:  Flowsheet Row Pulmonary Rehab Walk Test from 08/04/2020 in [place]  Date 08/04/20       Visit Diagnosis: Interstitial pulmonary disease ([place])  Post covid-19 condition, unspecified  Patient's Home Medications on Admission:   Current Outpatient Medications:    albuterol (PROVENTIL HFA;VENTOLIN HFA) 108 (90 Base) MCG/ACT inhaler, Inhale 2 puffs into the lungs every 4 (four) hours as needed for wheezing or shortness of breath (or coughing)., Disp: 1 Inhaler, Rfl: 0   amLODipine (NORVASC) 10 MG tablet, Take 1 tablet (10 mg total) by mouth daily., Disp: 90 tablet, Rfl: 2   aspirin EC 81 MG tablet, Take 81 mg by mouth daily., Disp: , Rfl:    chlorpheniramine-HYDROcodone (TUSSIONEX) 10-8 MG/5ML SUER, Take 5 mLs by mouth every 12 (twelve) hours., Disp: 140 mL, Rfl: 0   Cyanocobalamin (B-12) 1000 MCG CAPS, Take 1 capsule by mouth daily., Disp: , Rfl:    Flaxseed, Linseed, (FLAXSEED OIL) 1200 MG CAPS, Take 1 capsule by mouth daily., Disp: , Rfl:    Fluticasone-Umeclidin-Vilant (TRELEGY ELLIPTA) 100-62.5-25 MCG/INH AEPB, Inhale 1 puff into the lungs daily., Disp: 120 each, Rfl: 2   furosemide (LASIX) 20 MG tablet, Take 1 tablet (20 mg total) by mouth daily as needed for edema., Disp: 30 tablet, Rfl: 0   Ginkgo Biloba EXTR, Take 1 tablet by mouth 2 (two) times daily., Disp: , Rfl:    ketoconazole (NIZORAL) 2 % shampoo, Apply 1 application topically 2 (two) times a week., Disp: 120 mL, Rfl: 0   meloxicam (MOBIC) 15 MG tablet, Take 1 tablet (15 mg total) by mouth daily. Use as needed for joint pain, Disp: 90 tablet, Rfl: 0   metFORMIN (GLUCOPHAGE) 500 MG tablet, Take  250 mg by mouth daily., Disp: , Rfl:    Misc Natural Products (HIMALAYAN GOJI PO), Take 1 tablet by mouth in the morning and at bedtime. , Disp: , Rfl:    Multiple Vitamin (MULTIVITAMIN WITH MINERALS) TABS tablet, Take 1 tablet by mouth daily., Disp: , Rfl:    omeprazole (PRILOSEC) 20 MG capsule, Take 1 capsule (20 mg total) by mouth 2 (two) times daily., Disp: 180 capsule, Rfl: 1   pravastatin (PRAVACHOL) 40 MG tablet, Take 0.5 tablets (20 mg total) by mouth daily. Takes 1/2 pill daily, Disp: 45 tablet, Rfl: 1   primidone (MYSOLINE) 50 MG tablet, Take 1 tablet (50 mg total) by mouth 2 (two) times daily., Disp: 180 tablet, Rfl: 1   sildenafil (VIAGRA) 50 MG tablet, Take 1 tablet (50 mg total) by mouth daily as needed for erectile dysfunction., Disp: 10 tablet, Rfl: 3   terazosin (HYTRIN) 1 MG capsule, Take 1 capsule (1 mg total) by mouth 2 (two) times daily., Disp: 180 capsule, Rfl: 1   Tetrahydrozoline HCl (VISINE OP), Apply 1 drop to eye daily., Disp: , Rfl:    triamcinolone cream (KENALOG) 0.1 %, Apply 1 application topically 2 (two) times daily., Disp: 30 g, Rfl: 0  Past Medical History: Past Medical History:  Diagnosis Date   COPD (chronic obstructive pulmonary disease) ([place])    Diverticulosis    DM type 2 (diabetes mellitus, type 2) ([facility])    GERD (gastroesophageal reflux  disease)    uses prilosec    Hypertension    Pneumonia 2018   Raynaud's disease    Shortness of breath dyspnea    WITH EXERTION   Sinusitis    Tremors of nervous system     Tobacco Use: Social History   Tobacco Use  Smoking Status Former   Packs/day: 1.00   Years: 45.00   Pack years: 45.00   Types: Cigarettes   Quit date: 01/21/1994   Years since quitting: 26.5  Smokeless Tobacco Never    Labs: Recent Review Flowsheet Data     Labs for ITP Cardiac and Pulmonary Rehab Latest Ref Rng & Units 05/06/2019 08/06/2019 10/18/2019 02/25/2020 07/31/2020   Cholestrol <200 mg/dL - 145 125 - -   LDLCALC mg/dL (calc) -  88 67 - -   HDL > OR = 40 mg/dL - 45 44 - -   Trlycerides <150 mg/dL - 60 68 89 -   Hemoglobin A1c 4.6 - 6.5 % - 6.2 6.3(H) 6.5(H) 6.7(H)   TCO2 22 - 32 mmol/L 30 - - - -       Capillary Blood Glucose: Lab Results  Component Value Date   GLUCAP 151 (H) 02/29/2020   GLUCAP 155 (H) 02/29/2020   GLUCAP 93 05/07/2019   GLUCAP 95 05/07/2019   GLUCAP 176 (H) 05/06/2019     Pulmonary Assessment Scores:  Pulmonary Assessment Scores     Row Name 08/04/20 1600         ADL UCSD   ADL Phase Entry     SOB Score total 19           CAT Score     CAT Score 7           mMRC Score     mMRC Score 3            UCSD: Self-administered rating of dyspnea associated with activities of daily living (ADLs) 6-point scale (0 = "not at all" to 5 = "maximal or unable to do because of breathlessness")  Scoring Scores range from 0 to 120.  Minimally important difference is 5 units  CAT: CAT can identify the health impairment of COPD patients and is better correlated with disease progression.  CAT has a scoring range of zero to 40. The CAT score is classified into four groups of low (less than 10), medium (10 - 20), high (21-30) and very high (31-40) based on the impact level of disease on health status. A CAT score over 10 suggests significant symptoms.  A worsening CAT score could be explained by an exacerbation, poor medication adherence, poor inhaler technique, or progression of COPD or comorbid conditions.  CAT MCID is 2 points  mMRC: mMRC (Modified Medical Research Council) Dyspnea Scale is used to assess the degree of baseline functional disability in patients of respiratory disease due to dyspnea. No minimal important difference is established. A decrease in score of 1 point or greater is considered a positive change.   Pulmonary Function Assessment:  Pulmonary Function Assessment - 08/04/20 1422       Breath   Shortness of Breath Yes;Limiting activity              Exercise Target Goals: Exercise Program Goal: Individual exercise prescription set using results from initial 6 min walk test and THRR while considering  patient's activity barriers and safety.   Exercise Prescription Goal: Initial exercise prescription builds to 30-45 minutes a day of aerobic activity, 2-3 days per week.  Home exercise guidelines will be given to patient during program as part of exercise prescription that the participant will acknowledge.  Activity Barriers & Risk Stratification:  Activity Barriers & Cardiac Risk Stratification - 08/04/20 1417       Activity Barriers & Cardiac Risk Stratification   Activity Barriers Shortness of Breath;Deconditioning;Muscular Weakness             6 Minute Walk:  6 Minute Walk     Row Name 08/04/20 1551         6 Minute Walk   Phase Initial     Distance 1052 feet     Walk Time 6 minutes     # of Rest Breaks 1  1 standing rest break for 30 seconds due to SOB     MPH 1.99     METS 2.31     RPE 13     Perceived Dyspnea  3     VO2 Peak 8.08     Symptoms No     Resting HR 71 bpm     Resting BP 144/62     Resting Oxygen Saturation  96 %     Exercise Oxygen Saturation  during 6 min walk 88 %     Max Ex. HR 103 bpm     Max Ex. BP 190/58     2 Minute Post BP 152/64           Interval HR     1 Minute HR 90     2 Minute HR 97     3 Minute HR 97     4 Minute HR 90     5 Minute HR 101     6 Minute HR 103     2 Minute Post HR 79     Interval Heart Rate? Yes           Interval Oxygen     Interval Oxygen? Yes     Baseline Oxygen Saturation % 96 %     1 Minute Oxygen Saturation % 98 %     1 Minute Liters of Oxygen 2 L     2 Minute Oxygen Saturation % 97 %     2 Minute Liters of Oxygen 2 L     3 Minute Oxygen Saturation % 92 %     3 Minute Liters of Oxygen 2 L     4 Minute Oxygen Saturation % 88 %     4 Minute Liters of Oxygen 2 L     5 Minute Oxygen Saturation % 90 %     5 Minute Liters of Oxygen 2 L     6  Minute Oxygen Saturation % 89 %     6 Minute Liters of Oxygen 2 L     2 Minute Post Oxygen Saturation % 96 %     2 Minute Post Liters of Oxygen 2 L             Oxygen Initial Assessment:  Oxygen Initial Assessment - 08/04/20 1422       Home Oxygen   Home Oxygen Device Portable Concentrator;Home Concentrator    Sleep Oxygen Prescription Continuous    Liters per minute 2    Home Exercise Oxygen Prescription Continuous    Liters per minute 2    Home Resting Oxygen Prescription Continuous    Liters per minute 2    Compliance with Home Oxygen Use Yes      Initial 6 min Walk  Oxygen Used Continuous    Liters per minute 2      Program Oxygen Prescription   Program Oxygen Prescription Continuous    Liters per minute 2      Intervention   Short Term Goals To learn and exhibit compliance with exercise, home and travel O2 prescription;To learn and understand importance of monitoring SPO2 with pulse oximeter and demonstrate accurate use of the pulse oximeter.;To learn and understand importance of maintaining oxygen saturations>88%;To learn and demonstrate proper pursed lip breathing techniques or other breathing techniques. ;To learn and demonstrate proper use of respiratory medications    Long  Term Goals Exhibits compliance with exercise, home  and travel O2 prescription;Verbalizes importance of monitoring SPO2 with pulse oximeter and return demonstration;Maintenance of O2 saturations>88%;Exhibits proper breathing techniques, such as pursed lip breathing or other method taught during program session;Compliance with respiratory medication;Demonstrates proper use of MDI's             Oxygen Re-Evaluation:   Oxygen Discharge (Final Oxygen Re-Evaluation):   Initial Exercise Prescription:  Initial Exercise Prescription - 08/04/20 1500       Date of Initial Exercise RX and Referring Provider   Date 08/04/20    Referring Provider Dr. [name]    Expected Discharge Date 10/05/20       Oxygen   Oxygen Continuous    Liters 2      NuStep   Level 1    SPM 80    Minutes 15      Arm Ergometer   Level 1    Minutes 15      Prescription Details   Frequency (times per week) 2    Duration Progress to 30 minutes of continuous aerobic without signs/symptoms of physical distress      Intensity   THRR 40-80% of Max Heartrate 54-108    Ratings of Perceived Exertion 11-13    Perceived Dyspnea 0-4      Progression   Progression Continue to progress workloads to maintain intensity without signs/symptoms of physical distress.      Resistance Training   Training Prescription Yes    Weight blue bands    Reps 10-15             Perform Capillary Blood Glucose checks as needed.  Exercise Prescription Changes:   Exercise Comments:   Exercise Goals and Review:   Exercise Goals     Row Name 08/04/20 1542             Exercise Goals   Increase Physical Activity Yes       Intervention Provide advice, education, support and counseling about physical activity/exercise needs.;Develop an individualized exercise prescription for aerobic and resistive training based on initial evaluation findings, risk stratification, comorbidities and participant's personal goals.       Expected Outcomes Short Term: Attend rehab on a regular basis to increase amount of physical activity.;Long Term: Add in home exercise to make exercise part of routine and to increase amount of physical activity.;Long Term: Exercising regularly at least 3-5 days a week.       Increase Strength and Stamina Yes       Intervention Provide advice, education, support and counseling about physical activity/exercise needs.;Develop an individualized exercise prescription for aerobic and resistive training based on initial evaluation findings, risk stratification, comorbidities and participant's personal goals.       Expected Outcomes Short Term: Increase workloads from initial exercise prescription for  resistance, speed, and METs.;Short Term: Perform resistance training exercises  routinely during rehab and add in resistance training at home;Long Term: Improve cardiorespiratory fitness, muscular endurance and strength as measured by increased METs and functional capacity (6MWT)       Able to understand and use rate of perceived exertion (RPE) scale Yes       Intervention Provide education and explanation on how to use RPE scale       Expected Outcomes Short Term: Able to use RPE daily in rehab to express subjective intensity level;Long Term:  Able to use RPE to guide intensity level when exercising independently       Able to understand and use Dyspnea scale Yes       Intervention Provide education and explanation on how to use Dyspnea scale       Expected Outcomes Short Term: Able to use Dyspnea scale daily in rehab to express subjective sense of shortness of breath during exertion;Long Term: Able to use Dyspnea scale to guide intensity level when exercising independently       Knowledge and understanding of Target Heart Rate Range (THRR) Yes       Intervention Provide education and explanation of THRR including how the numbers were predicted and where they are located for reference       Expected Outcomes Short Term: Able to state/look up THRR;Long Term: Able to use THRR to govern intensity when exercising independently;Short Term: Able to use daily as guideline for intensity in rehab       Understanding of Exercise Prescription Yes       Intervention Provide education, explanation, and written materials on patient's individual exercise prescription       Expected Outcomes Short Term: Able to explain program exercise prescription;Long Term: Able to explain home exercise prescription to exercise independently                Exercise Goals Re-Evaluation :   Discharge Exercise Prescription (Final Exercise Prescription Changes):   Nutrition:  Target Goals: Understanding of nutrition  guidelines, daily intake of sodium 1500mg , cholesterol 200mg , calories 30% from fat and 7% or less from saturated fats, daily to have 5 or more servings of fruits and vegetables.  Biometrics:  Pre Biometrics - 08/04/20 1542       Pre Biometrics   Grip Strength 40.5 kg              Nutrition Therapy Plan and Nutrition Goals:   Nutrition Assessments:  MEDIFICTS Score Key: ?70 Need to make dietary changes  40-70 Heart Healthy Diet ? 40 Therapeutic Level Cholesterol Diet   Picture Your Plate Scores: <61 Unhealthy dietary pattern with much room for improvement. 41-50 Dietary pattern unlikely to meet recommendations for good health and room for improvement. 51-60 More healthful dietary pattern, with some room for improvement.  >60 Healthy dietary pattern, although there may be some specific behaviors that could be improved.    Nutrition Goals Re-Evaluation:   Nutrition Goals Discharge (Final Nutrition Goals Re-Evaluation):   Psychosocial: Target Goals: Acknowledge presence or absence of significant depression and/or stress, maximize coping skills, provide positive support system. Participant is able to verbalize types and ability to use techniques and skills needed for reducing stress and depression.  Initial Review & Psychosocial Screening:  Initial Psych Review & Screening - 08/04/20 1423       Initial Review   Current issues with None Identified      Family Dynamics   Good Support System? Yes   Roomate and friends   Comments Pt does  not display any concerns      Barriers   Psychosocial barriers to participate in program The patient should benefit from training in stress management and relaxation.      Screening Interventions   Interventions Encouraged to exercise    Expected Outcomes Long Term goal: The participant improves quality of Life and PHQ9 Scores as seen by post scores and/or verbalization of changes             Quality of Life  Scores:  Scores of 19 and below usually indicate a poorer quality of life in these areas.  A difference of  2-3 points is a clinically meaningful difference.  A difference of 2-3 points in the total score of the Quality of Life Index has been associated with significant improvement in overall quality of life, self-image, physical symptoms, and general health in studies assessing change in quality of life.  PHQ-9: Recent Review Flowsheet Data     Depression screen [facility] 2/9 08/04/2020 06/01/2020 05/30/2020   Decreased Interest 0 0 0   Down, Depressed, Hopeless 0 0 0   PHQ - 2 Score 0 0 0   Altered sleeping 0 - -   Tired, decreased energy 0 - -   Change in appetite 0 - -   Feeling bad or failure about yourself  0 - -   Trouble concentrating 0 - -   Moving slowly or fidgety/restless 0 - -   Suicidal thoughts 0 - -      Interpretation of Total Score  Total Score Depression Severity:  1-4 = Minimal depression, 5-9 = Mild depression, 10-14 = Moderate depression, 15-19 = Moderately severe depression, 20-27 = Severe depression   Psychosocial Evaluation and Intervention:  Psychosocial Evaluation - 08/04/20 1544       Psychosocial Evaluation & Interventions   Interventions Encouraged to exercise with the program and follow exercise prescription    Comments Pt does not show any signs of depression or any psychosocial concerns. He has a positive outlook on life.    Expected Outcomes For pt to remain free of psychosocial concerns and to maintain mental well being    Continue Psychosocial Services  No Follow up required             Psychosocial Re-Evaluation:   Psychosocial Discharge (Final Psychosocial Re-Evaluation):   Education: Education Goals: Education classes will be provided on a weekly basis, covering required topics. Participant will state understanding/return demonstration of topics presented.  Learning Barriers/Preferences:  Learning Barriers/Preferences - 08/04/20 1426        Learning Barriers/Preferences   Learning Barriers Sight;Hearing   hearing aides in both ears, and wears glasses   Learning Preferences Skilled Demonstration;Individual Instruction;Verbal Instruction             Education Topics: Risk Factor Reduction:  -Group instruction that is supported by a PowerPoint presentation. Instructor discusses the definition of a risk factor, different risk factors for pulmonary disease, and how the heart and lungs work together.     Nutrition for Pulmonary Patient:  -Group instruction provided by PowerPoint slides, verbal discussion, and written materials to support subject matter. The instructor gives an explanation and review of healthy diet recommendations, which includes a discussion on weight management, recommendations for fruit and vegetable consumption, as well as protein, fluid, caffeine, fiber, sodium, sugar, and alcohol. Tips for eating when patients are short of breath are discussed.   Pursed Lip Breathing:  -Group instruction that is supported by demonstration and informational handouts. Instructor  discusses the benefits of pursed lip and diaphragmatic breathing and detailed demonstration on how to preform both.     Oxygen Safety:  -Group instruction provided by PowerPoint, verbal discussion, and written material to support subject matter. There is an overview of "What is Oxygen" and "Why do we need it".  Instructor also reviews how to create a safe environment for oxygen use, the importance of using oxygen as prescribed, and the risks of noncompliance. There is a brief discussion on traveling with oxygen and resources the patient may utilize.   Oxygen Equipment:  -Group instruction provided by [facility] Staff utilizing handouts, written materials, and equipment demonstrations.   Signs and Symptoms:  -Group instruction provided by written material and verbal discussion to support subject matter. Warning signs and symptoms of  infection, stroke, and heart attack are reviewed and when to call the physician/911 reinforced. Tips for preventing the spread of infection discussed.   Advanced Directives:  -Group instruction provided by verbal instruction and written material to support subject matter. Instructor reviews Advanced Directive laws and proper instruction for filling out document.   Pulmonary Video:  -Group video education that reviews the importance of medication and oxygen compliance, exercise, good nutrition, pulmonary hygiene, and pursed lip and diaphragmatic breathing for the pulmonary patient.   Exercise for the Pulmonary Patient:  -Group instruction that is supported by a PowerPoint presentation. Instructor discusses benefits of exercise, core components of exercise, frequency, duration, and intensity of an exercise routine, importance of utilizing pulse oximetry during exercise, safety while exercising, and options of places to exercise outside of rehab.     Pulmonary Medications:  -Verbally interactive group education provided by instructor with focus on inhaled medications and proper administration.   Anatomy and Physiology of the Respiratory System and Intimacy:  -Group instruction provided by PowerPoint, verbal discussion, and written material to support subject matter. Instructor reviews respiratory cycle and anatomical components of the respiratory system and their functions. Instructor also reviews differences in obstructive and restrictive respiratory diseases with examples of each. Intimacy, Sex, and Sexuality differences are reviewed with a discussion on how relationships can change when diagnosed with pulmonary disease. Common sexual concerns are reviewed.   MD DAY -A group question and answer session with a medical doctor that allows participants to ask questions that relate to their pulmonary disease state.   OTHER EDUCATION -Group or individual verbal, written, or video instructions  that support the educational goals of the pulmonary rehab program.   Holiday Eating Survival Tips:  -Group instruction provided by PowerPoint slides, verbal discussion, and written materials to support subject matter. The instructor gives patients tips, tricks, and techniques to help them not only survive but enjoy the holidays despite the onslaught of food that accompanies the holidays.   Knowledge Questionnaire Score:  Knowledge Questionnaire Score - 08/04/20 1559       Knowledge Questionnaire Score   Pre Score 17/18             Core Components/Risk Factors/Patient Goals at Admission:  Personal Goals and Risk Factors at Admission - 08/04/20 1546       Core Components/Risk Factors/Patient Goals on Admission    Weight Management Weight Maintenance    Improve shortness of breath with ADL's Yes    Intervention Provide education, individualized exercise plan and daily activity instruction to help decrease symptoms of SOB with activities of daily living.    Expected Outcomes Short Term: Improve cardiorespiratory fitness to achieve a reduction of symptoms when performing ADLs;Long  Term: Be able to perform more ADLs without symptoms or delay the onset of symptoms    Hypertension Yes    Intervention Provide education on lifestyle modifcations including regular physical activity/exercise, weight management, moderate sodium restriction and increased consumption of fresh fruit, vegetables, and low fat dairy, alcohol moderation, and smoking cessation.;Monitor prescription use compliance.    Expected Outcomes Short Term: Continued assessment and intervention until BP is < 140/66mm HG in hypertensive participants. < 130/68mm HG in hypertensive participants with diabetes, heart failure or chronic kidney disease.;Long Term: Maintenance of blood pressure at goal levels.             Core Components/Risk Factors/Patient Goals Review:    Core Components/Risk Factors/Patient Goals at Discharge  (Final Review):    ITP Comments:   Comments:

## 2020-08-04 NOTE — Progress Notes (Signed)
Pulmonary Rehab Orientation Physical Assessment Note  Physical assessment reveals  Pt is alert and oriented x 3.  Heart rate is normal, breath sounds crackles characteristic of ILD throughout posterior, clear anterior. Reports non-productive cough. Bowel sounds present.  Pt denies abdominal discomfort, nausea, vomiting or diarrhea. Grip strength equal, strong. Distal pulses palpable with swelling to lower extremities  bilateral left greater than the right.  Pt wears compression socks. [name], BSN Cardiac and Training and development officer

## 2020-08-04 NOTE — Progress Notes (Signed)
Johnathan [name] 87 y.o. male Pulmonary Rehab Orientation Note This patient who was referred to Pulmonary rehab by Dr. [name] with the diagnosis of Interstitial Pulmonary disease and Post COVID-19 condition arrived today in Cardiac and Pulmonary Rehab. He arrived ambulatory with steady gait. He does carry portable oxygen. Per pt, he uses oxygen continuously. Color good, skin warm and dry. Patient is oriented to time and place. Patient's medical history, psychosocial health, and medications reviewed. Psychosocial assessment reveals pt  lives with a roommate in a house . Pt is currently retired. Pt reports his stress level is low. Areas of stress/anxiety include Health.  Pt does not exhibit signs of depression. PHQ2/9 score 0/0. Pt shows good  coping skills with positive outlook. [name] was offered emotional support and reassurance. Will continue to monitor and evaluate progress toward psychosocial goal(s) of continued mental well being. Patient reports he does take medications as prescribed. Patient states he follows a Regular diet. The patient reports no specific efforts to gain or lose weight. Patient's weight will be monitored closely. Demonstration and practice of PLB using pulse oximeter. Patient able to return demonstration satisfactorily. Safety and hand hygiene in the exercise area reviewed with patient. Patient voices understanding of the information reviewed. Department expectations discussed with patient and achievable goals were set. The patient shows enthusiasm about attending the program and we look forward to working with this nice gentleman. The patient completed a 6 min walk test today and is scheduled to begin exercise on 08/08/20.  45 minutes was spent on a variety of activities such as assessment of the patient, obtaining baseline data including height, weight, BMI, and grip strength, verifying medical history, allergies, and current medications, and teaching patient strategies for performing tasks  with less respiratory effort with emphasis on pursed lip breathing.  Rick Duff MS, ACSM CEP 4:12 PM 08/04/2020

## 2020-08-08 ENCOUNTER — Other Ambulatory Visit: Payer: Self-pay

## 2020-08-08 ENCOUNTER — Encounter (HOSPITAL_COMMUNITY)
Admission: RE | Admit: 2020-08-08 | Discharge: 2020-08-08 | Disposition: A | Discharge status: Home / Self Care | Payer: Medicare HMO | Source: Ambulatory Visit | Attending: Pulmonary Disease | Admitting: Pulmonary Disease

## 2020-08-08 DIAGNOSIS — J849 Interstitial pulmonary disease, unspecified: Secondary | ICD-10-CM

## 2020-08-08 DIAGNOSIS — U099 Post covid-19 condition, unspecified: Secondary | ICD-10-CM | POA: Diagnosis not present

## 2020-08-08 LAB — GLUCOSE, CAPILLARY
Glucose-Capillary: 91 mg/dL (ref 70–99)
Glucose-Capillary: 102 mg/dL — ABNORMAL HIGH (ref 70–99)

## 2020-08-08 NOTE — Progress Notes (Signed)
Pulmonary Individual Treatment Plan  Patient Details  Name: Johnathan [name] MRN: [id number] Date of Birth: 1932/07/30 Referring Provider:   [name] Pulmonary Rehab Walk Test from 08/04/2020 in [place]  Referring Provider Dr. [name]       Initial Encounter Date:  Flowsheet Row Pulmonary Rehab Walk Test from 08/04/2020 in [place]  Date 08/04/20       Visit Diagnosis: Interstitial pulmonary disease ([place])  Post covid-19 condition, unspecified  Patient's Home Medications on Admission:   Current Outpatient Medications:    albuterol (PROVENTIL HFA;VENTOLIN HFA) 108 (90 Base) MCG/ACT inhaler, Inhale 2 puffs into the lungs every 4 (four) hours as needed for wheezing or shortness of breath (or coughing)., Disp: 1 Inhaler, Rfl: 0   amLODipine (NORVASC) 10 MG tablet, Take 1 tablet (10 mg total) by mouth daily., Disp: 90 tablet, Rfl: 2   aspirin EC 81 MG tablet, Take 81 mg by mouth daily., Disp: , Rfl:    chlorpheniramine-HYDROcodone (TUSSIONEX) 10-8 MG/5ML SUER, Take 5 mLs by mouth every 12 (twelve) hours., Disp: 140 mL, Rfl: 0   Cyanocobalamin (B-12) 1000 MCG CAPS, Take 1 capsule by mouth daily., Disp: , Rfl:    Flaxseed, Linseed, (FLAXSEED OIL) 1200 MG CAPS, Take 1 capsule by mouth daily., Disp: , Rfl:    Fluticasone-Umeclidin-Vilant (TRELEGY ELLIPTA) 100-62.5-25 MCG/INH AEPB, Inhale 1 puff into the lungs daily., Disp: 120 each, Rfl: 2   furosemide (LASIX) 20 MG tablet, Take 1 tablet (20 mg total) by mouth daily as needed for edema., Disp: 30 tablet, Rfl: 0   Ginkgo Biloba EXTR, Take 1 tablet by mouth 2 (two) times daily., Disp: , Rfl:    ketoconazole (NIZORAL) 2 % shampoo, Apply 1 application topically 2 (two) times a week., Disp: 120 mL, Rfl: 0   meloxicam (MOBIC) 15 MG tablet, Take 1 tablet (15 mg total) by mouth daily. Use as needed for joint pain, Disp: 90 tablet, Rfl: 0   metFORMIN (GLUCOPHAGE) 500 MG tablet, Take  250 mg by mouth daily., Disp: , Rfl:    Misc Natural Products (HIMALAYAN GOJI PO), Take 1 tablet by mouth in the morning and at bedtime. , Disp: , Rfl:    Multiple Vitamin (MULTIVITAMIN WITH MINERALS) TABS tablet, Take 1 tablet by mouth daily., Disp: , Rfl:    omeprazole (PRILOSEC) 20 MG capsule, Take 1 capsule (20 mg total) by mouth 2 (two) times daily., Disp: 180 capsule, Rfl: 1   pravastatin (PRAVACHOL) 40 MG tablet, Take 0.5 tablets (20 mg total) by mouth daily. Takes 1/2 pill daily, Disp: 45 tablet, Rfl: 1   primidone (MYSOLINE) 50 MG tablet, Take 1 tablet (50 mg total) by mouth 2 (two) times daily., Disp: 180 tablet, Rfl: 1   sildenafil (VIAGRA) 50 MG tablet, Take 1 tablet (50 mg total) by mouth daily as needed for erectile dysfunction., Disp: 10 tablet, Rfl: 3   terazosin (HYTRIN) 1 MG capsule, Take 1 capsule (1 mg total) by mouth 2 (two) times daily., Disp: 180 capsule, Rfl: 1   Tetrahydrozoline HCl (VISINE OP), Apply 1 drop to eye daily., Disp: , Rfl:    triamcinolone cream (KENALOG) 0.1 %, Apply 1 application topically 2 (two) times daily., Disp: 30 g, Rfl: 0  Past Medical History: Past Medical History:  Diagnosis Date   COPD (chronic obstructive pulmonary disease) ([place])    Diverticulosis    DM type 2 (diabetes mellitus, type 2) ([facility])    GERD (gastroesophageal reflux  disease)    uses prilosec    Hypertension    Pneumonia 2018   Raynaud's disease    Shortness of breath dyspnea    WITH EXERTION   Sinusitis    Tremors of nervous system     Tobacco Use: Social History   Tobacco Use  Smoking Status Former   Packs/day: 1.00   Years: 45.00   Pack years: 45.00   Types: Cigarettes   Quit date: 01/21/1994   Years since quitting: 26.5  Smokeless Tobacco Never    Labs: Recent Review Flowsheet Data     Labs for ITP Cardiac and Pulmonary Rehab Latest Ref Rng & Units 05/06/2019 08/06/2019 10/18/2019 02/25/2020 07/31/2020   Cholestrol <200 mg/dL - 145 125 - -   LDLCALC mg/dL (calc) -  88 67 - -   HDL > OR = 40 mg/dL - 45 44 - -   Trlycerides <150 mg/dL - 60 68 89 -   Hemoglobin A1c 4.6 - 6.5 % - 6.2 6.3(H) 6.5(H) 6.7(H)   TCO2 22 - 32 mmol/L 30 - - - -       Capillary Blood Glucose: Lab Results  Component Value Date   GLUCAP 151 (H) 02/29/2020   GLUCAP 155 (H) 02/29/2020   GLUCAP 93 05/07/2019   GLUCAP 95 05/07/2019   GLUCAP 176 (H) 05/06/2019     Pulmonary Assessment Scores:  Pulmonary Assessment Scores     Row Name 08/04/20 1600         ADL UCSD   ADL Phase Entry     SOB Score total 19           CAT Score     CAT Score 7           mMRC Score     mMRC Score 3            UCSD: Self-administered rating of dyspnea associated with activities of daily living (ADLs) 6-point scale (0 = "not at all" to 5 = "maximal or unable to do because of breathlessness")  Scoring Scores range from 0 to 120.  Minimally important difference is 5 units  CAT: CAT can identify the health impairment of COPD patients and is better correlated with disease progression.  CAT has a scoring range of zero to 40. The CAT score is classified into four groups of low (less than 10), medium (10 - 20), high (21-30) and very high (31-40) based on the impact level of disease on health status. A CAT score over 10 suggests significant symptoms.  A worsening CAT score could be explained by an exacerbation, poor medication adherence, poor inhaler technique, or progression of COPD or comorbid conditions.  CAT MCID is 2 points  mMRC: mMRC (Modified Medical Research Council) Dyspnea Scale is used to assess the degree of baseline functional disability in patients of respiratory disease due to dyspnea. No minimal important difference is established. A decrease in score of 1 point or greater is considered a positive change.   Pulmonary Function Assessment:  Pulmonary Function Assessment - 08/04/20 1422       Breath   Shortness of Breath Yes;Limiting activity              Exercise Target Goals: Exercise Program Goal: Individual exercise prescription set using results from initial 6 min walk test and THRR while considering  patient's activity barriers and safety.   Exercise Prescription Goal: Initial exercise prescription builds to 30-45 minutes a day of aerobic activity, 2-3 days per week.  Home exercise guidelines will be given to patient during program as part of exercise prescription that the participant will acknowledge.  Activity Barriers & Risk Stratification:  Activity Barriers & Cardiac Risk Stratification - 08/04/20 1417       Activity Barriers & Cardiac Risk Stratification   Activity Barriers Shortness of Breath;Deconditioning;Muscular Weakness             6 Minute Walk:  6 Minute Walk     Row Name 08/04/20 1551         6 Minute Walk   Phase Initial     Distance 1052 feet     Walk Time 6 minutes     # of Rest Breaks 1  1 standing rest break for 30 seconds due to SOB     MPH 1.99     METS 2.31     RPE 13     Perceived Dyspnea  3     VO2 Peak 8.08     Symptoms No     Resting HR 71 bpm     Resting BP 144/62     Resting Oxygen Saturation  96 %     Exercise Oxygen Saturation  during 6 min walk 88 %     Max Ex. HR 103 bpm     Max Ex. BP 190/58     2 Minute Post BP 152/64           Interval HR     1 Minute HR 90     2 Minute HR 97     3 Minute HR 97     4 Minute HR 90     5 Minute HR 101     6 Minute HR 103     2 Minute Post HR 79     Interval Heart Rate? Yes           Interval Oxygen     Interval Oxygen? Yes     Baseline Oxygen Saturation % 96 %     1 Minute Oxygen Saturation % 98 %     1 Minute Liters of Oxygen 2 L     2 Minute Oxygen Saturation % 97 %     2 Minute Liters of Oxygen 2 L     3 Minute Oxygen Saturation % 92 %     3 Minute Liters of Oxygen 2 L     4 Minute Oxygen Saturation % 88 %     4 Minute Liters of Oxygen 2 L     5 Minute Oxygen Saturation % 90 %     5 Minute Liters of Oxygen 2 L     6  Minute Oxygen Saturation % 89 %     6 Minute Liters of Oxygen 2 L     2 Minute Post Oxygen Saturation % 96 %     2 Minute Post Liters of Oxygen 2 L             Oxygen Initial Assessment:  Oxygen Initial Assessment - 08/04/20 1422       Home Oxygen   Home Oxygen Device Portable Concentrator;Home Concentrator    Sleep Oxygen Prescription Continuous    Liters per minute 2    Home Exercise Oxygen Prescription Continuous    Liters per minute 2    Home Resting Oxygen Prescription Continuous    Liters per minute 2    Compliance with Home Oxygen Use Yes      Initial 6 min Walk  Oxygen Used Continuous    Liters per minute 2      Program Oxygen Prescription   Program Oxygen Prescription Continuous    Liters per minute 2      Intervention   Short Term Goals To learn and exhibit compliance with exercise, home and travel O2 prescription;To learn and understand importance of monitoring SPO2 with pulse oximeter and demonstrate accurate use of the pulse oximeter.;To learn and understand importance of maintaining oxygen saturations>88%;To learn and demonstrate proper pursed lip breathing techniques or other breathing techniques. ;To learn and demonstrate proper use of respiratory medications    Long  Term Goals Exhibits compliance with exercise, home  and travel O2 prescription;Verbalizes importance of monitoring SPO2 with pulse oximeter and return demonstration;Maintenance of O2 saturations>88%;Exhibits proper breathing techniques, such as pursed lip breathing or other method taught during program session;Compliance with respiratory medication;Demonstrates proper use of MDI's             Oxygen Re-Evaluation:  Oxygen Re-Evaluation     Row Name 08/07/20 1625             Program Oxygen Prescription   Program Oxygen Prescription Continuous       Liters per minute 2       Comments Pt is scheduled to start program this week. We will monitor oxygen saturation and titrate as needed.                Home Oxygen     Home Oxygen Device Portable Concentrator;Home Concentrator       Sleep Oxygen Prescription Continuous       Liters per minute 2       Home Exercise Oxygen Prescription Continuous       Liters per minute 2       Home Resting Oxygen Prescription Continuous       Liters per minute 2       Compliance with Home Oxygen Use Yes               Goals/Expected Outcomes     Short Term Goals To learn and exhibit compliance with exercise, home and travel O2 prescription;To learn and understand importance of monitoring SPO2 with pulse oximeter and demonstrate accurate use of the pulse oximeter.;To learn and understand importance of maintaining oxygen saturations>88%;To learn and demonstrate proper pursed lip breathing techniques or other breathing techniques. ;To learn and demonstrate proper use of respiratory medications       Long  Term Goals Exhibits compliance with exercise, home  and travel O2 prescription;Verbalizes importance of monitoring SPO2 with pulse oximeter and return demonstration;Maintenance of O2 saturations>88%;Exhibits proper breathing techniques, such as pursed lip breathing or other method taught during program session;Compliance with respiratory medication;Demonstrates proper use of MDI's       Goals/Expected Outcomes compliance and understanding of monitoring oxygen saturation at home and understanding importance of pursed lip breathing.               Oxygen Discharge (Final Oxygen Re-Evaluation):  Oxygen Re-Evaluation - 08/07/20 1625       Program Oxygen Prescription   Program Oxygen Prescription Continuous    Liters per minute 2    Comments Pt is scheduled to start program this week. We will monitor oxygen saturation and titrate as needed.      Home Oxygen   Home Oxygen Device Portable Concentrator;Home Concentrator    Sleep Oxygen Prescription Continuous    Liters per minute 2    Home Exercise Oxygen Prescription Continuous  Liters per  minute 2    Home Resting Oxygen Prescription Continuous    Liters per minute 2    Compliance with Home Oxygen Use Yes      Goals/Expected Outcomes   Short Term Goals To learn and exhibit compliance with exercise, home and travel O2 prescription;To learn and understand importance of monitoring SPO2 with pulse oximeter and demonstrate accurate use of the pulse oximeter.;To learn and understand importance of maintaining oxygen saturations>88%;To learn and demonstrate proper pursed lip breathing techniques or other breathing techniques. ;To learn and demonstrate proper use of respiratory medications    Long  Term Goals Exhibits compliance with exercise, home  and travel O2 prescription;Verbalizes importance of monitoring SPO2 with pulse oximeter and return demonstration;Maintenance of O2 saturations>88%;Exhibits proper breathing techniques, such as pursed lip breathing or other method taught during program session;Compliance with respiratory medication;Demonstrates proper use of MDI's    Goals/Expected Outcomes compliance and understanding of monitoring oxygen saturation at home and understanding importance of pursed lip breathing.             Initial Exercise Prescription:  Initial Exercise Prescription - 08/04/20 1500       Date of Initial Exercise RX and Referring Provider   Date 08/04/20    Referring Provider Dr. [name]    Expected Discharge Date 10/05/20      Oxygen   Oxygen Continuous    Liters 2      NuStep   Level 1    SPM 80    Minutes 15      Arm Ergometer   Level 1    Minutes 15      Prescription Details   Frequency (times per week) 2    Duration Progress to 30 minutes of continuous aerobic without signs/symptoms of physical distress      Intensity   THRR 40-80% of Max Heartrate 54-108    Ratings of Perceived Exertion 11-13    Perceived Dyspnea 0-4      Progression   Progression Continue to progress workloads to maintain intensity without signs/symptoms of  physical distress.      Resistance Training   Training Prescription Yes    Weight blue bands    Reps 10-15             Perform Capillary Blood Glucose checks as needed.  Exercise Prescription Changes:   Exercise Comments:   Exercise Goals and Review:   Exercise Goals     Row Name 08/04/20 1542             Exercise Goals   Increase Physical Activity Yes       Intervention Provide advice, education, support and counseling about physical activity/exercise needs.;Develop an individualized exercise prescription for aerobic and resistive training based on initial evaluation findings, risk stratification, comorbidities and participant's personal goals.       Expected Outcomes Short Term: Attend rehab on a regular basis to increase amount of physical activity.;Long Term: Add in home exercise to make exercise part of routine and to increase amount of physical activity.;Long Term: Exercising regularly at least 3-5 days a week.       Increase Strength and Stamina Yes       Intervention Provide advice, education, support and counseling about physical activity/exercise needs.;Develop an individualized exercise prescription for aerobic and resistive training based on initial evaluation findings, risk stratification, comorbidities and participant's personal goals.       Expected Outcomes Short Term: Increase workloads from initial exercise prescription for resistance,  speed, and METs.;Short Term: Perform resistance training exercises routinely during rehab and add in resistance training at home;Long Term: Improve cardiorespiratory fitness, muscular endurance and strength as measured by increased METs and functional capacity (6MWT)       Able to understand and use rate of perceived exertion (RPE) scale Yes       Intervention Provide education and explanation on how to use RPE scale       Expected Outcomes Short Term: Able to use RPE daily in rehab to express subjective intensity level;Long  Term:  Able to use RPE to guide intensity level when exercising independently       Able to understand and use Dyspnea scale Yes       Intervention Provide education and explanation on how to use Dyspnea scale       Expected Outcomes Short Term: Able to use Dyspnea scale daily in rehab to express subjective sense of shortness of breath during exertion;Long Term: Able to use Dyspnea scale to guide intensity level when exercising independently       Knowledge and understanding of Target Heart Rate Range (THRR) Yes       Intervention Provide education and explanation of THRR including how the numbers were predicted and where they are located for reference       Expected Outcomes Short Term: Able to state/look up THRR;Long Term: Able to use THRR to govern intensity when exercising independently;Short Term: Able to use daily as guideline for intensity in rehab       Understanding of Exercise Prescription Yes       Intervention Provide education, explanation, and written materials on patient's individual exercise prescription       Expected Outcomes Short Term: Able to explain program exercise prescription;Long Term: Able to explain home exercise prescription to exercise independently                Exercise Goals Re-Evaluation :  Exercise Goals Re-Evaluation     Row Name 08/07/20 1624             Exercise Goal Re-Evaluation   Exercise Goals Review Increase Physical Activity;Increase Strength and Stamina;Able to understand and use rate of perceived exertion (RPE) scale;Able to understand and use Dyspnea scale;Knowledge and understanding of Target Heart Rate Range (THRR);Understanding of Exercise Prescription       Comments Patient is scheduled to start exercise this week. Will monitor and progress as he is able.       Expected Outcomes Through exercise at rehab and home, the patient will decrease shortness of breath with daily activities and feel confident in carrying out an exercise regimn at  home.                Discharge Exercise Prescription (Final Exercise Prescription Changes):   Nutrition:  Target Goals: Understanding of nutrition guidelines, daily intake of sodium 1500mg , cholesterol 200mg , calories 30% from fat and 7% or less from saturated fats, daily to have 5 or more servings of fruits and vegetables.  Biometrics:  Pre Biometrics - 08/04/20 1542       Pre Biometrics   Grip Strength 40.5 kg              Nutrition Therapy Plan and Nutrition Goals:   Nutrition Assessments:  MEDIFICTS Score Key: ?70 Need to make dietary changes  40-70 Heart Healthy Diet ? 40 Therapeutic Level Cholesterol Diet   Picture Your Plate Scores: <09 Unhealthy dietary pattern with much room for improvement. 41-50 Dietary pattern  unlikely to meet recommendations for good health and room for improvement. 51-60 More healthful dietary pattern, with some room for improvement.  >60 Healthy dietary pattern, although there may be some specific behaviors that could be improved.    Nutrition Goals Re-Evaluation:   Nutrition Goals Discharge (Final Nutrition Goals Re-Evaluation):   Psychosocial: Target Goals: Acknowledge presence or absence of significant depression and/or stress, maximize coping skills, provide positive support system. Participant is able to verbalize types and ability to use techniques and skills needed for reducing stress and depression.  Initial Review & Psychosocial Screening:  Initial Psych Review & Screening - 08/04/20 1423       Initial Review   Current issues with None Identified      Family Dynamics   Good Support System? Yes   Roomate and friends   Comments Pt does not display any concerns      Barriers   Psychosocial barriers to participate in program The patient should benefit from training in stress management and relaxation.      Screening Interventions   Interventions Encouraged to exercise    Expected Outcomes Long Term goal: The  participant improves quality of Life and PHQ9 Scores as seen by post scores and/or verbalization of changes             Quality of Life Scores:  Scores of 19 and below usually indicate a poorer quality of life in these areas.  A difference of  2-3 points is a clinically meaningful difference.  A difference of 2-3 points in the total score of the Quality of Life Index has been associated with significant improvement in overall quality of life, self-image, physical symptoms, and general health in studies assessing change in quality of life.  PHQ-9: Recent Review Flowsheet Data     Depression screen [facility] 2/9 08/04/2020 06/01/2020 05/30/2020   Decreased Interest 0 0 0   Down, Depressed, Hopeless 0 0 0   PHQ - 2 Score 0 0 0   Altered sleeping 0 - -   Tired, decreased energy 0 - -   Change in appetite 0 - -   Feeling bad or failure about yourself  0 - -   Trouble concentrating 0 - -   Moving slowly or fidgety/restless 0 - -   Suicidal thoughts 0 - -      Interpretation of Total Score  Total Score Depression Severity:  1-4 = Minimal depression, 5-9 = Mild depression, 10-14 = Moderate depression, 15-19 = Moderately severe depression, 20-27 = Severe depression   Psychosocial Evaluation and Intervention:  Psychosocial Evaluation - 08/04/20 1544       Psychosocial Evaluation & Interventions   Interventions Encouraged to exercise with the program and follow exercise prescription    Comments Pt does not show any signs of depression or any psychosocial concerns. He has a positive outlook on life.    Expected Outcomes For pt to remain free of psychosocial concerns and to maintain mental well being    Continue Psychosocial Services  No Follow up required             Psychosocial Re-Evaluation:  Psychosocial Re-Evaluation     [place] Name 08/07/20 1345             Psychosocial Re-Evaluation   Current issues with None Identified       Comments Jacobey will begin exercising 08/08/2020.   No change in psychosocial concerns since orientation/ walk test.       Expected Outcomes For  Kaiyden to continue to be free of psychosocial concerns while participating in pulmonary rehab.       Interventions Encouraged to attend Pulmonary Rehabilitation for the exercise       Continue Psychosocial Services  No Follow up required                Psychosocial Discharge (Final Psychosocial Re-Evaluation):  Psychosocial Re-Evaluation - 08/07/20 1345       Psychosocial Re-Evaluation   Current issues with None Identified    Comments Juancarlos will begin exercising 08/08/2020.  No change in psychosocial concerns since orientation/ walk test.    Expected Outcomes For Romano to continue to be free of psychosocial concerns while participating in pulmonary rehab.    Interventions Encouraged to attend Pulmonary Rehabilitation for the exercise    Continue Psychosocial Services  No Follow up required             Education: Education Goals: Education classes will be provided on a weekly basis, covering required topics. Participant will state understanding/return demonstration of topics presented.  Learning Barriers/Preferences:  Learning Barriers/Preferences - 08/04/20 1426       Learning Barriers/Preferences   Learning Barriers Sight;Hearing   hearing aides in both ears, and wears glasses   Learning Preferences Skilled Demonstration;Individual Instruction;Verbal Instruction             Education Topics: Risk Factor Reduction:  -Group instruction that is supported by a PowerPoint presentation. Instructor discusses the definition of a risk factor, different risk factors for pulmonary disease, and how the heart and lungs work together.     Nutrition for Pulmonary Patient:  -Group instruction provided by PowerPoint slides, verbal discussion, and written materials to support subject matter. The instructor gives an explanation and review of healthy diet recommendations, which includes a  discussion on weight management, recommendations for fruit and vegetable consumption, as well as protein, fluid, caffeine, fiber, sodium, sugar, and alcohol. Tips for eating when patients are short of breath are discussed.   Pursed Lip Breathing:  -Group instruction that is supported by demonstration and informational handouts. Instructor discusses the benefits of pursed lip and diaphragmatic breathing and detailed demonstration on how to preform both.     Oxygen Safety:  -Group instruction provided by PowerPoint, verbal discussion, and written material to support subject matter. There is an overview of "What is Oxygen" and "Why do we need it".  Instructor also reviews how to create a safe environment for oxygen use, the importance of using oxygen as prescribed, and the risks of noncompliance. There is a brief discussion on traveling with oxygen and resources the patient may utilize.   Oxygen Equipment:  -Group instruction provided by [facility] Staff utilizing handouts, written materials, and equipment demonstrations.   Signs and Symptoms:  -Group instruction provided by written material and verbal discussion to support subject matter. Warning signs and symptoms of infection, stroke, and heart attack are reviewed and when to call the physician/911 reinforced. Tips for preventing the spread of infection discussed.   Advanced Directives:  -Group instruction provided by verbal instruction and written material to support subject matter. Instructor reviews Advanced Directive laws and proper instruction for filling out document.   Pulmonary Video:  -Group video education that reviews the importance of medication and oxygen compliance, exercise, good nutrition, pulmonary hygiene, and pursed lip and diaphragmatic breathing for the pulmonary patient.   Exercise for the Pulmonary Patient:  -Group instruction that is supported by a PowerPoint presentation. Instructor discusses benefits of  exercise,  core components of exercise, frequency, duration, and intensity of an exercise routine, importance of utilizing pulse oximetry during exercise, safety while exercising, and options of places to exercise outside of rehab.     Pulmonary Medications:  -Verbally interactive group education provided by instructor with focus on inhaled medications and proper administration.   Anatomy and Physiology of the Respiratory System and Intimacy:  -Group instruction provided by PowerPoint, verbal discussion, and written material to support subject matter. Instructor reviews respiratory cycle and anatomical components of the respiratory system and their functions. Instructor also reviews differences in obstructive and restrictive respiratory diseases with examples of each. Intimacy, Sex, and Sexuality differences are reviewed with a discussion on how relationships can change when diagnosed with pulmonary disease. Common sexual concerns are reviewed.   MD DAY -A group question and answer session with a medical doctor that allows participants to ask questions that relate to their pulmonary disease state.   OTHER EDUCATION -Group or individual verbal, written, or video instructions that support the educational goals of the pulmonary rehab program.   Holiday Eating Survival Tips:  -Group instruction provided by PowerPoint slides, verbal discussion, and written materials to support subject matter. The instructor gives patients tips, tricks, and techniques to help them not only survive but enjoy the holidays despite the onslaught of food that accompanies the holidays.   Knowledge Questionnaire Score:  Knowledge Questionnaire Score - 08/04/20 1559       Knowledge Questionnaire Score   Pre Score 17/18             Core Components/Risk Factors/Patient Goals at Admission:  Personal Goals and Risk Factors at Admission - 08/04/20 1546       Core Components/Risk Factors/Patient Goals on Admission     Weight Management Weight Maintenance    Improve shortness of breath with ADL's Yes    Intervention Provide education, individualized exercise plan and daily activity instruction to help decrease symptoms of SOB with activities of daily living.    Expected Outcomes Short Term: Improve cardiorespiratory fitness to achieve a reduction of symptoms when performing ADLs;Long Term: Be able to perform more ADLs without symptoms or delay the onset of symptoms    Hypertension Yes    Intervention Provide education on lifestyle modifcations including regular physical activity/exercise, weight management, moderate sodium restriction and increased consumption of fresh fruit, vegetables, and low fat dairy, alcohol moderation, and smoking cessation.;Monitor prescription use compliance.    Expected Outcomes Short Term: Continued assessment and intervention until BP is < 140/31mm HG in hypertensive participants. < 130/70mm HG in hypertensive participants with diabetes, heart failure or chronic kidney disease.;Long Term: Maintenance of blood pressure at goal levels.             Core Components/Risk Factors/Patient Goals Review:   Goals and Risk Factor Review     Row Name 08/07/20 1347             Core Components/Risk Factors/Patient Goals Review   Personal Goals Review Develop more efficient breathing techniques such as purse lipped breathing and diaphragmatic breathing and practicing self-pacing with activity.;Increase knowledge of respiratory medications and ability to use respiratory devices properly.;Improve shortness of breath with ADL's       Review Rigoberto will begin exercise in pulmonary rehab 08/08/2020.       Expected Outcomes See admission goals.                Core Components/Risk Factors/Patient Goals at Discharge (Final Review):   Goals and Risk Factor  Review - 08/07/20 1347       Core Components/Risk Factors/Patient Goals Review   Personal Goals Review Develop more efficient  breathing techniques such as purse lipped breathing and diaphragmatic breathing and practicing self-pacing with activity.;Increase knowledge of respiratory medications and ability to use respiratory devices properly.;Improve shortness of breath with ADL's    Review Abdou will begin exercise in pulmonary rehab 08/08/2020.    Expected Outcomes See admission goals.             ITP Comments:   Comments: Brant is scheduled to start pulmonary rehab this week. Pulmonary rehab staff will monitor and reassess progress toward goals during his participation in Pulmonary Rehab.

## 2020-08-08 NOTE — Progress Notes (Signed)
Daily Session Note  Patient Details  Name: Johnathan [name] MRN: [id number] Date of Birth: Jun 12, 1932 Referring Provider:   [name] Pulmonary Rehab Walk Test from 08/04/2020 in [place]  Referring Provider Dr. [name]       Encounter Date: 08/08/2020  Check In:  Session Check In - 08/08/20 1500       Check-In   Supervising physician immediately available to respond to emergencies [facility] immediately available    Physician(s) Dr. Sabino Gasser    Location [facility]    Staff Present Rosebud Poles, RN, Isaac Laud, MS, ACSM-CEP, Exercise Physiologist;Lisa Ysidro Evert, RN    Virtual Visit No    Medication changes reported     No    Fall or balance concerns reported    No    Tobacco Cessation No Change    Warm-up and Cool-down Performed as group-led instruction    Resistance Training Performed Yes    PAD/SET Patient? No      Pain Assessment   Currently in Pain? No/denies    Multiple Pain Sites No             Capillary Blood Glucose: Results for orders placed or performed during the hospital encounter of 08/08/20 (from the past 24 hour(s))  Glucose, capillary     Status: Abnormal   Collection Time: 08/08/20  2:17 PM  Result Value Ref Range   Glucose-Capillary 102 (H) 70 - 99 mg/dL      Social History   Tobacco Use  Smoking Status Former   Packs/day: 1.00   Years: 45.00   Pack years: 45.00   Types: Cigarettes   Quit date: 01/21/1994   Years since quitting: 26.5  Smokeless Tobacco Never    Goals Met:  Proper associated with RPD/PD & O2 Sat Exercise tolerated well No report of cardiac concerns or symptoms Strength training completed today  Goals Unmet:  Not Applicable  Comments: Service time is from 1320 to 1430 Patient completed first day of exercise and tolerated well with no complaints or concerns.     Dr. [name] is Medical Director for Cardiac Rehab at [facility].

## 2020-08-10 ENCOUNTER — Other Ambulatory Visit: Payer: Self-pay

## 2020-08-10 ENCOUNTER — Encounter (HOSPITAL_COMMUNITY)
Admission: RE | Admit: 2020-08-10 | Discharge: 2020-08-10 | Disposition: A | Discharge status: Home / Self Care | Payer: Medicare HMO | Source: Ambulatory Visit | Attending: Pulmonary Disease | Admitting: Pulmonary Disease

## 2020-08-10 DIAGNOSIS — U099 Post covid-19 condition, unspecified: Secondary | ICD-10-CM | POA: Diagnosis not present

## 2020-08-10 DIAGNOSIS — J849 Interstitial pulmonary disease, unspecified: Secondary | ICD-10-CM | POA: Diagnosis not present

## 2020-08-10 LAB — GLUCOSE, CAPILLARY: Glucose-Capillary: 88 mg/dL (ref 70–99)

## 2020-08-10 NOTE — Progress Notes (Signed)
Johnathan [name] 87 y.o. male Nutrition Note  Diagnosis: Interstitial pulmonary disease; post covid-19   Past Medical History:  Diagnosis Date   COPD (chronic obstructive pulmonary disease) ([place])    Diverticulosis    DM type 2 (diabetes mellitus, type 2) ([facility])    GERD (gastroesophageal reflux disease)    uses prilosec    Hypertension    Pneumonia 2018   Raynaud's disease    Shortness of breath dyspnea    WITH EXERTION   Sinusitis    Tremors of nervous system      Medications reviewed.   Current Outpatient Medications:    albuterol (PROVENTIL HFA;VENTOLIN HFA) 108 (90 Base) MCG/ACT inhaler, Inhale 2 puffs into the lungs every 4 (four) hours as needed for wheezing or shortness of breath (or coughing)., Disp: 1 Inhaler, Rfl: 0   amLODipine (NORVASC) 10 MG tablet, Take 1 tablet (10 mg total) by mouth daily., Disp: 90 tablet, Rfl: 2   aspirin EC 81 MG tablet, Take 81 mg by mouth daily., Disp: , Rfl:    chlorpheniramine-HYDROcodone (TUSSIONEX) 10-8 MG/5ML SUER, Take 5 mLs by mouth every 12 (twelve) hours., Disp: 140 mL, Rfl: 0   Cyanocobalamin (B-12) 1000 MCG CAPS, Take 1 capsule by mouth daily., Disp: , Rfl:    Flaxseed, Linseed, (FLAXSEED OIL) 1200 MG CAPS, Take 1 capsule by mouth daily., Disp: , Rfl:    Fluticasone-Umeclidin-Vilant (TRELEGY ELLIPTA) 100-62.5-25 MCG/INH AEPB, Inhale 1 puff into the lungs daily., Disp: 120 each, Rfl: 2   furosemide (LASIX) 20 MG tablet, Take 1 tablet (20 mg total) by mouth daily as needed for edema., Disp: 30 tablet, Rfl: 0   Ginkgo Biloba EXTR, Take 1 tablet by mouth 2 (two) times daily., Disp: , Rfl:    ketoconazole (NIZORAL) 2 % shampoo, Apply 1 application topically 2 (two) times a week., Disp: 120 mL, Rfl: 0   meloxicam (MOBIC) 15 MG tablet, Take 1 tablet (15 mg total) by mouth daily. Use as needed for joint pain, Disp: 90 tablet, Rfl: 0   metFORMIN (GLUCOPHAGE) 500 MG tablet, Take 250 mg by mouth daily., Disp: , Rfl:    Misc Natural Products  (HIMALAYAN GOJI PO), Take 1 tablet by mouth in the morning and at bedtime. , Disp: , Rfl:    Multiple Vitamin (MULTIVITAMIN WITH MINERALS) TABS tablet, Take 1 tablet by mouth daily., Disp: , Rfl:    omeprazole (PRILOSEC) 20 MG capsule, Take 1 capsule (20 mg total) by mouth 2 (two) times daily., Disp: 180 capsule, Rfl: 1   pravastatin (PRAVACHOL) 40 MG tablet, Take 0.5 tablets (20 mg total) by mouth daily. Takes 1/2 pill daily, Disp: 45 tablet, Rfl: 1   primidone (MYSOLINE) 50 MG tablet, Take 1 tablet (50 mg total) by mouth 2 (two) times daily., Disp: 180 tablet, Rfl: 1   sildenafil (VIAGRA) 50 MG tablet, Take 1 tablet (50 mg total) by mouth daily as needed for erectile dysfunction., Disp: 10 tablet, Rfl: 3   terazosin (HYTRIN) 1 MG capsule, Take 1 capsule (1 mg total) by mouth 2 (two) times daily., Disp: 180 capsule, Rfl: 1   Tetrahydrozoline HCl (VISINE OP), Apply 1 drop to eye daily., Disp: , Rfl:    triamcinolone cream (KENALOG) 0.1 %, Apply 1 application topically 2 (two) times daily., Disp: 30 g, Rfl: 0   Ht Readings from Last 1 Encounters:  08/04/20 5\' 8"  (1.727 m)     Wt Readings from Last 3 Encounters:  08/04/20 163 lb 2.3 oz (74 kg)  07/31/20 166 lb 3.2 oz (75.4 kg)  07/03/20 158 lb (71.7 kg)     There is no height or weight on file to calculate BMI.   Social History   Tobacco Use  Smoking Status Former   Packs/day: 1.00   Years: 45.00   Pack years: 45.00   Types: Cigarettes   Quit date: 01/21/1994   Years since quitting: 26.5  Smokeless Tobacco Never      Nutrition Note  Spoke with pt. Nutrition Plan and Nutrition Survey goals reviewed with pt.  PYP survey shows he is eating a healthy diet.  Pt has Type 2 Diabetes. Last A1c indicates blood glucose well-controlled. Pt does not check CBGs at home.  He wants to lose 5-10 lbs.  Diet recall: Breakfast: banana/slimfast Lunch: Premier protein shake Dinner: frozen meal or eats out Estimated energy intake:  <1300  kcals/day. His appetite is normal.  BMI is 24.18 kg/m2. Optimal BMI for COPD is 25-29.99 kg/m2.  No vitamin D labs available.  Pt on oxygen therapy. He does not have any problems cooking or grocery shopping. He wears oxygen while eating. Sometimes he forgets. He never checks O2 saturation while eating.   Pt expressed understanding of the information reviewed.    Nutrition Diagnosis Food-and nutrition-related knowledge deficit related to lack of exposure to information as related to diagnosis of: ? COPD  Nutrition Intervention Pt's individual nutrition plan reviewed with pt. Benefits of adopting healthy diet reviewed with Rate My Plate survey   Continue client-centered nutrition education by RD, as part of interdisciplinary care.  Goal(s)  Pt to build a healthy plate including vegetables, fruits, whole grains, and low-fat dairy products in a heart healthy meal plan. Pt to read labels to reduce sodium intake in frozen meals  Pt to choose non starchy vegetables with dinner and fruit with lunch  Plan:  Will provide client-centered nutrition education as part of interdisciplinary care Monitor and evaluate progress toward nutrition goal with team.   Michaele Offer, MS, RDN, LDN

## 2020-08-10 NOTE — Progress Notes (Signed)
Daily Session Note  Patient Details  Name: Johnathan [name] MRN: [id number] Date of Birth: [birth year] Referring Provider:   [name] Pulmonary Rehab Walk Test from 08/04/2020 in [place]  Referring Provider Dr. [name]       Encounter Date: 08/10/2020  Check In:  Session Check In - 08/10/20 1408       Check-In   Supervising physician immediately available to respond to emergencies [facility] immediately available    Physician(s) Dr. Sabino Gasser    [facility]    Staff Present Rosebud Poles, RN, BSN;Lisa Ysidro Evert, Cathleen Fears, MS, ACSM-CEP, Exercise Physiologist;Jessica Hassell Done, MS, ACSM-CEP, Exercise Physiologist    Virtual Visit No    Medication changes reported     No    Fall or balance concerns reported    No    Tobacco Cessation No Change    Warm-up and Cool-down Performed as group-led instruction    Resistance Training Performed Yes    VAD Patient? No    PAD/SET Patient? No      Pain Assessment   Currently in Pain? No/denies    Pain Score 0-No pain    Multiple Pain Sites No             Capillary Blood Glucose: Results for orders placed or performed during the hospital encounter of 08/10/20 (from the past 24 hour(s))  Glucose, capillary     Status: None   Collection Time: 08/10/20  2:34 PM  Result Value Ref Range   Glucose-Capillary 88 70 - 99 mg/dL      Social History   Tobacco Use  Smoking Status Former   Packs/day: 1.00   Years: 45.00   Pack years: 45.00   Types: Cigarettes   Quit date: 01/21/1994   Years since quitting: 26.5  Smokeless Tobacco Never    Goals Met:  Proper associated with RPD/PD & O2 Sat Exercise tolerated well No report of cardiac concerns or symptoms Strength training completed today  Goals Unmet:  Not Applicable  Comments: Service time is from 1320 to 1430    Dr. [name] is Medical Director for Cardiac Rehab at [facility].

## 2020-08-11 ENCOUNTER — Other Ambulatory Visit: Payer: Self-pay | Admitting: Family Medicine

## 2020-08-11 LAB — GLUCOSE, CAPILLARY: Glucose-Capillary: 80 mg/dL (ref 70–99)

## 2020-08-15 ENCOUNTER — Encounter (HOSPITAL_COMMUNITY)
Admission: RE | Admit: 2020-08-15 | Discharge: 2020-08-15 | Disposition: A | Discharge status: Home / Self Care | Payer: Medicare HMO | Source: Ambulatory Visit | Attending: Pulmonary Disease | Admitting: Pulmonary Disease

## 2020-08-15 ENCOUNTER — Other Ambulatory Visit: Payer: Self-pay

## 2020-08-15 VITALS — Wt 165.3 lb

## 2020-08-15 DIAGNOSIS — U099 Post covid-19 condition, unspecified: Secondary | ICD-10-CM | POA: Diagnosis not present

## 2020-08-15 DIAGNOSIS — J849 Interstitial pulmonary disease, unspecified: Secondary | ICD-10-CM | POA: Diagnosis not present

## 2020-08-15 NOTE — Progress Notes (Signed)
Daily Session Note  Patient Details  Name: ZAEDYN COVIN MRN: [id number] Date of Birth: [birth year] Referring Provider:   [name] Pulmonary Rehab Walk Test from 08/04/2020 in [place]  Referring Provider Dr. [name]       Encounter Date: 08/15/2020  Check In:  Session Check In - 08/15/20 1431       Check-In   Supervising physician immediately available to respond to emergencies [facility] immediately available    Physician(s) Dr. Sabino Gasser    [facility]    Staff Present Rosebud Poles, RN, BSN;Lisa Ysidro Evert, Cathleen Fears, MS, ACSM-CEP, Exercise Physiologist;Jessica Hassell Done, MS, ACSM-CEP, Exercise Physiologist    Virtual Visit No    Medication changes reported     No    Fall or balance concerns reported    No    Tobacco Cessation No Change    Warm-up and Cool-down Performed as group-led instruction    Resistance Training Performed Yes    VAD Patient? No    PAD/SET Patient? No      Pain Assessment   Currently in Pain? No/denies    Multiple Pain Sites No             Capillary Blood Glucose: No results found for this or any previous visit (from the past 24 hour(s)).  POCT Glucose - 08/15/20 1525       POCT Blood Glucose   Pre-Exercise 91 mg/dL    Post-Exercise 102 mg/dL    Pre-Exercise #2 80 mg/dL    Post-Exercise #2 88 mg/dL             Exercise Prescription Changes - 08/15/20 1500       Response to Exercise   Blood Pressure (Admit) 120/64    Blood Pressure (Exercise) 120/60    Blood Pressure (Exit) 124/60    Heart Rate (Admit) 74 bpm    Heart Rate (Exercise) 90 bpm    Heart Rate (Exit) 75 bpm    Oxygen Saturation (Admit) 95 %    Oxygen Saturation (Exercise) 95 %    Oxygen Saturation (Exit) 95 %    Rating of Perceived Exertion (Exercise) 13    Perceived Dyspnea (Exercise) 1    Duration Progress to 30 minutes of  aerobic without signs/symptoms of physical distress      Progression    Progression Continue to progress workloads to maintain intensity without signs/symptoms of physical distress.      Resistance Training   Training Prescription Yes    Weight blue bands    Reps 10-15    Time 10 Minutes      Oxygen   Oxygen Continuous    Liters 2      NuStep   Level 1    SPM 80    Minutes 15    METs 2.1      Arm Ergometer   Level 1    Minutes 15    METs 2.1             Social History   Tobacco Use  Smoking Status Former   Packs/day: 1.00   Years: 45.00   Pack years: 45.00   Types: Cigarettes   Quit date: 01/21/1994   Years since quitting: 26.5  Smokeless Tobacco Never    Goals Met:  Proper associated with RPD/PD & O2 Sat Exercise tolerated well No report of cardiac concerns or symptoms Strength training completed today  Goals Unmet:  Not Applicable  Comments: Service time  is from 1320 to 1440    Dr. [name] is Medical Director for Cardiac Rehab at [facility].

## 2020-08-16 DIAGNOSIS — D485 Neoplasm of uncertain behavior of skin: Secondary | ICD-10-CM | POA: Diagnosis not present

## 2020-08-17 ENCOUNTER — Encounter (HOSPITAL_COMMUNITY)
Admission: RE | Admit: 2020-08-17 | Discharge: 2020-08-17 | Disposition: A | Discharge status: Home / Self Care | Payer: Medicare HMO | Source: Ambulatory Visit | Attending: Pulmonary Disease | Admitting: Pulmonary Disease

## 2020-08-17 ENCOUNTER — Other Ambulatory Visit: Payer: Self-pay

## 2020-08-17 DIAGNOSIS — J849 Interstitial pulmonary disease, unspecified: Secondary | ICD-10-CM | POA: Diagnosis not present

## 2020-08-17 DIAGNOSIS — U099 Post covid-19 condition, unspecified: Secondary | ICD-10-CM

## 2020-08-17 NOTE — Progress Notes (Signed)
Daily Session Note  Patient Details  Name: Johnathan [name] MRN: [id number] Date of Birth: [birth year] Referring Provider:   [name] Pulmonary Rehab Walk Test from 08/04/2020 in [place]  Referring Provider Dr. [name]       Encounter Date: 08/17/2020  Check In:  Session Check In - 08/17/20 1429       Check-In   Supervising physician immediately available to respond to emergencies [facility] immediately available    Physician(s) Dr. Karleen Hampshire    [facility]    Staff Present Rosebud Poles, RN, Quentin Ore, MS, ACSM-CEP, Exercise Physiologist;Jessica Hassell Done, MS, ACSM-CEP, Exercise Physiologist;Lisa Ysidro Evert, RN    Virtual Visit No    Medication changes reported     No    Fall or balance concerns reported    No    Tobacco Cessation No Change    Warm-up and Cool-down Performed as group-led instruction    Resistance Training Performed Yes    VAD Patient? No    PAD/SET Patient? No      Pain Assessment   Currently in Pain? No/denies    Multiple Pain Sites No             Capillary Blood Glucose: No results found for this or any previous visit (from the past 24 hour(s)).    Social History   Tobacco Use  Smoking Status Former   Packs/day: 1.00   Years: 45.00   Pack years: 45.00   Types: Cigarettes   Quit date: 01/21/1994   Years since quitting: 26.5  Smokeless Tobacco Never    Goals Met:  Proper associated with RPD/PD & O2 Sat Exercise tolerated well No report of cardiac concerns or symptoms Strength training completed today  Goals Unmet:  Not Applicable  Comments: Service time is from 1320 to 1440    Dr. [name] is Medical Director for Cardiac Rehab at [facility].

## 2020-08-19 ENCOUNTER — Other Ambulatory Visit: Payer: Self-pay | Admitting: Family Medicine

## 2020-08-21 DIAGNOSIS — U071 COVID-19: Secondary | ICD-10-CM | POA: Diagnosis not present

## 2020-08-22 ENCOUNTER — Encounter (HOSPITAL_COMMUNITY): Payer: Medicare HMO

## 2020-08-23 ENCOUNTER — Other Ambulatory Visit: Payer: Self-pay

## 2020-08-23 NOTE — Patient Outreach (Signed)
[facility]) Care Management  08/23/2020  Johnathan [name] [birth year] [id number]   Telephone Assessment    Successful outreach call placed to patient. Patient reports he was a little confused and accidentally reported to MD office today for RN CM phone call appt. RN CM explained to patient that appts go on his schedule and via MyChart but that RN CM appts are only phone calls for telephone assessment for future reference. He voiced understanding. Patient currently out and about running errands. He denies any acute issues or concerns a present. He remains independent with ADLs/IADLs. Denies any recent falls. Appetite remains well. Wgt stable but patient reports he wants to lose some weight after wgt gain from steroids several months ago. He was having an issue with PCP and mail order pharmacy. He plans to call and follow up with this later today when he returns home.  He denies any RN CM needs or concerns at this time.    Medications Reviewed Today     Reviewed by Hayden Pedro, RN (Registered Nurse) on 08/23/20 at 1100  Med List Status: <None>   Medication Order Taking? Sig Documenting Provider Last Dose Status Informant  albuterol (PROVENTIL HFA;VENTOLIN HFA) 108 (90 Base) MCG/ACT inhaler [id number] No Inhale 2 puffs into the lungs every 4 (four) hours as needed for wheezing or shortness of breath (or coughing). Delora Fuel, MD Taking Active Self  amLODipine (NORVASC) 10 MG tablet [id number] No Take 1 tablet (10 mg total) by mouth daily. Copland, Gay Filler, MD Taking Active   aspirin EC 81 MG tablet [id number] No Take 81 mg by mouth daily. [provider] Taking Active Self  chlorpheniramine-HYDROcodone (TUSSIONEX) 10-8 MG/5ML SUER [id number] No Take 5 mLs by mouth every 12 (twelve) hours. Terrilee Croak, MD Taking Active   Cyanocobalamin (B-12) 1000 MCG CAPS [id number] No Take 1 capsule by mouth daily. [provider] Taking Active Self  Flaxseed,  Linseed, (FLAXSEED OIL) 1200 MG CAPS [id number] No Take 1 capsule by mouth daily. [provider] Taking Active Self  Fluticasone-Umeclidin-Vilant (TRELEGY ELLIPTA) 100-62.5-25 MCG/INH AEPB [id number] No Inhale 1 puff into the lungs daily. Marshell Garfinkel, MD Taking Active   furosemide (LASIX) 20 MG tablet [id number] No Take 1 tablet (20 mg total) by mouth daily as needed for edema. Marrian Salvage, FNP Taking Active   Ginkgo Kathyrn Sheriff [id number] No Take 1 tablet by mouth 2 (two) times daily. [provider] Taking Active Self  ketoconazole (NIZORAL) 2 % shampoo [id number] No Apply 1 application topically 2 (two) times a week. Marrian Salvage, FNP Taking Active   meloxicam [facility]) 15 MG tablet [id number] No Take 1 tablet (15 mg total) by mouth daily. Use as needed for joint pain Copland, Gay Filler, MD Taking Active   metFORMIN (GLUCOPHAGE) 500 MG tablet [id number] No Take 250 mg by mouth daily. [provider] Taking Active Self  Misc Natural Products (HIMALAYAN GOJI PO) [id number] No Take 1 tablet by mouth in the morning and at bedtime.  [provider] Taking Active Self  Multiple Vitamin (MULTIVITAMIN WITH MINERALS) TABS tablet [id number] No Take 1 tablet by mouth daily. [provider] Taking Active Self  omeprazole (PRILOSEC) 20 MG capsule [id number]  TAKE 1 CAPSULE TWICE DAILY Copland, Gay Filler, MD  Active   pravastatin (PRAVACHOL) 40 MG tablet [id number]  TAKE 1/2 TABLET EVERY DAY Copland, Gay Filler, MD  Active   primidone (MYSOLINE) 50 MG tablet [id number]  TAKE 1 TABLET TWICE DAILY Copland, Janett Billow  C, MD  Active   sildenafil (VIAGRA) 50 MG tablet [id number] No Take 1 tablet (50 mg total) by mouth daily as needed for erectile dysfunction. Copland, Gay Filler, MD Taking Active   terazosin (HYTRIN) 1 MG capsule [id number]  TAKE 1 CAPSULE TWICE DAILY Copland, Gay Filler, MD  Active   Tetrahydrozoline HCl (VISINE OP) [id number] No Apply 1 drop to eye  daily. [provider] Taking Active Self  triamcinolone cream (KENALOG) 0.1 % [id number] No Apply 1 application topically 2 (two) times daily. Marrian Salvage, FNP Taking Active                Goals Addressed             This Visit's Progress    Track and Manage My Symptoms-COPD       Barriers: Knowledge Timeframe:  Long-Range Goal Priority:  High Start Date:     06/01/20                       Expected End Date:    01/20/21                   Follow Up Date Nov 2022 - follow rescue plan if symptoms flare-up - keep follow-up appointments    Why is this important?   Tracking your symptoms and other information about your health helps your doctor plan your care.  Write down the symptoms, the time of day, what you were doing and what medicine you are taking.  You will soon learn how to manage your symptoms.     Notes: 06/01/20 Continue with COPD regimen.    08/23/20-Patient reports breathing has been managed and WNL for him. He continues to go to pulmonary rehab for another month. He denies any recent SOB or exacerbation of sxs.        Plan: RN CM discussed with patient next outreach within the month of Nov. Patient gave verbal consent and in agreement with RN CM follow up and timeframe. Patient aware that they may contact RN CM sooner for any issues or concerns. RN CM reviewed goals and plan of care with patient. Patient agrees to care plan and follow up. RN CM will send quarterly update to PCP.  Enzo Montgomery, RN,BSN,CCM [facility] Telephonic Care Management Coordinator Direct Phone: [phone] Toll Free: [phone] Fax: [phone]

## 2020-08-24 ENCOUNTER — Other Ambulatory Visit: Payer: Self-pay

## 2020-08-24 ENCOUNTER — Encounter (HOSPITAL_COMMUNITY)
Admission: RE | Admit: 2020-08-24 | Discharge: 2020-08-24 | Disposition: A | Discharge status: Home / Self Care | Payer: Medicare HMO | Source: Ambulatory Visit | Attending: Pulmonary Disease | Admitting: Pulmonary Disease

## 2020-08-24 DIAGNOSIS — U099 Post covid-19 condition, unspecified: Secondary | ICD-10-CM | POA: Insufficient documentation

## 2020-08-24 DIAGNOSIS — J849 Interstitial pulmonary disease, unspecified: Secondary | ICD-10-CM | POA: Diagnosis not present

## 2020-08-24 NOTE — Progress Notes (Signed)
Daily Session Note  Patient Details  Name: Johnathan [name] MRN: [id number] Date of Birth: 1932-04-26 Referring Provider:   [name] Pulmonary Rehab Walk Test from 08/04/2020 in [place]  Referring Provider Dr. [name]       Encounter Date: 08/24/2020  Check In:  Session Check In - 08/24/20 1441       Check-In   Supervising physician immediately available to respond to emergencies [facility] immediately available    Physician(s) Dr. Broadus John    Location [facility]    Staff Present Rosebud Poles, RN, Milus Glazier, MS, ACSM-CEP, CCRP, Exercise Physiologist;Carlette Wilber Oliphant, RN, BSN;Avyukth Bontempo Ysidro Evert, Felipe Drone, RN, MHA    Virtual Visit No    Medication changes reported     No    Fall or balance concerns reported    No    Tobacco Cessation No Change    Warm-up and Cool-down Performed as group-led instruction    Resistance Training Performed Yes    VAD Patient? No    PAD/SET Patient? No      Pain Assessment   Currently in Pain? No/denies    Multiple Pain Sites No             Capillary Blood Glucose: No results found for this or any previous visit (from the past 24 hour(s)).    Social History   Tobacco Use  Smoking Status Former   Packs/day: 1.00   Years: 45.00   Pack years: 45.00   Types: Cigarettes   Quit date: 01/21/1994   Years since quitting: 26.6  Smokeless Tobacco Never    Goals Met:  Exercise tolerated well No report of cardiac concerns or symptoms Strength training completed today  Goals Unmet:  Not Applicable  Comments: Service time is from 1320 to 1430    Dr. [name] is Medical Director for Cardiac Rehab at [facility].

## 2020-08-29 ENCOUNTER — Other Ambulatory Visit: Payer: Self-pay

## 2020-08-29 ENCOUNTER — Encounter (HOSPITAL_COMMUNITY)
Admission: RE | Admit: 2020-08-29 | Discharge: 2020-08-29 | Disposition: A | Discharge status: Home / Self Care | Payer: Medicare HMO | Source: Ambulatory Visit | Attending: Pulmonary Disease | Admitting: Pulmonary Disease

## 2020-08-29 VITALS — Wt 163.1 lb

## 2020-08-29 DIAGNOSIS — U099 Post covid-19 condition, unspecified: Secondary | ICD-10-CM

## 2020-08-29 DIAGNOSIS — J849 Interstitial pulmonary disease, unspecified: Secondary | ICD-10-CM | POA: Diagnosis not present

## 2020-08-29 NOTE — Progress Notes (Signed)
Daily Session Note  Patient Details  Name: Johnathan [name] MRN: [id number] Date of Birth: 07-Nov-1932 Referring Provider:   April [name] Pulmonary Rehab Walk Test from 08/04/2020 in [place]  Referring Provider Dr. Vaughan [name]       Encounter Date: 08/29/2020  Check In:  Session Check In - 08/29/20 1437       Check-In   Supervising physician immediately available to respond to emergencies [facility] immediately available    Physician(s) Dr. Broadus [name]    [facility]    Staff Present Johnathan Poles, RN, BSN;Johnathan Wilber Oliphant, RN, Johnathan Cash, RN    Virtual Visit No    Medication changes reported     No    Fall or balance concerns reported    No    Tobacco Cessation No Change    Warm-up and Cool-down Performed as group-led instruction    Resistance Training Performed Yes    VAD Patient? No    PAD/SET Patient? No      Pain Assessment   Currently in Pain? No/denies    Multiple Pain Sites No             Capillary Blood Glucose: No results found for this or any previous visit (from the past 24 hour(s)).   Exercise Prescription Changes - 08/29/20 1500       Response to Exercise   Blood Pressure (Admit) 136/64    Blood Pressure (Exercise) 144/76    Blood Pressure (Exit) 102/52    Heart Rate (Admit) 81 bpm    Heart Rate (Exercise) 94 bpm    Heart Rate (Exit) 81 bpm    Oxygen Saturation (Admit) 94 %    Oxygen Saturation (Exercise) 96 %    Oxygen Saturation (Exit) 97 %    Rating of Perceived Exertion (Exercise) 13    Perceived Dyspnea (Exercise) 1    Duration Continue with 30 min of aerobic exercise without signs/symptoms of physical distress.    Intensity Other (comment)   40-80% HR Max     Progression   Progression Continue to progress workloads to maintain intensity without signs/symptoms of physical distress.      Resistance Training   Training Prescription Yes    Weight blue bands    Reps 10-15     Time 10 Minutes      Oxygen   Oxygen Continuous    Liters 2      NuStep   Level 3    SPM 80    Minutes 15    METs 2      Arm Ergometer   Level 1    Minutes 15             Social History   Tobacco Use  Smoking Status Former   Packs/day: 1.00   Years: 45.00   Pack years: 45.00   Types: Cigarettes   Quit date: 01/21/1994   Years since quitting: 26.6  Smokeless Tobacco Never    Goals Met:  Proper associated with RPD/PD & O2 Sat Exercise tolerated well No report of cardiac concerns or symptoms Strength training completed today  Goals Unmet:  Not Applicable  Comments: Service time is from 1320 to 1447    Dr. Fransico [name] is Medical Director for Cardiac Rehab at [facility].

## 2020-08-31 ENCOUNTER — Other Ambulatory Visit: Payer: Self-pay

## 2020-08-31 ENCOUNTER — Encounter (HOSPITAL_COMMUNITY)
Admission: RE | Admit: 2020-08-31 | Discharge: 2020-08-31 | Disposition: A | Discharge status: Home / Self Care | Payer: Medicare HMO | Source: Ambulatory Visit | Attending: Pulmonary Disease | Admitting: Pulmonary Disease

## 2020-08-31 DIAGNOSIS — J849 Interstitial pulmonary disease, unspecified: Secondary | ICD-10-CM

## 2020-08-31 DIAGNOSIS — U099 Post covid-19 condition, unspecified: Secondary | ICD-10-CM | POA: Diagnosis not present

## 2020-08-31 NOTE — Progress Notes (Signed)
Daily Session Note  Patient Details  Name: Johnathan [name] MRN: [id number] Date of Birth: Mar 16, 1932 Referring Provider:   [name] Pulmonary Rehab Walk Test from 08/04/2020 in [place]  Referring Provider Dr. [name]       Encounter Date: 08/31/2020  Check In:  Session Check In - 08/31/20 1427       Check-In   Supervising physician immediately available to respond to emergencies [facility] immediately available    Physician(s) Dr. Alfredia Ferguson    Location [facility]    Staff Present Rosebud Poles, RN, BSN;Jetta Walker BS, ACSM EP-C, Exercise Physiologist;Nikolai Wilczak Clarisa Schools, MS, ACSM-CEP, Exercise Physiologist    Virtual Visit No    Medication changes reported     No    Fall or balance concerns reported    No    Tobacco Cessation No Change    Warm-up and Cool-down Performed as group-led instruction    Resistance Training Performed Yes    VAD Patient? No    PAD/SET Patient? No      Pain Assessment   Currently in Pain? No/denies    Pain Score 0-No pain    Multiple Pain Sites No             Capillary Blood Glucose: No results found for this or any previous visit (from the past 24 hour(s)).    Social History   Tobacco Use  Smoking Status Former   Packs/day: 1.00   Years: 45.00   Pack years: 45.00   Types: Cigarettes   Quit date: 01/21/1994   Years since quitting: 26.6  Smokeless Tobacco Never    Goals Met:  Exercise tolerated well No report of cardiac concerns or symptoms Strength training completed today  Goals Unmet:  Not Applicable  Comments:  Service time is from 1320 to 1440   Dr. [name] is Medical Director for Cardiac Rehab at [facility].

## 2020-09-04 DIAGNOSIS — L821 Other seborrheic keratosis: Secondary | ICD-10-CM | POA: Diagnosis not present

## 2020-09-04 DIAGNOSIS — L570 Actinic keratosis: Secondary | ICD-10-CM | POA: Diagnosis not present

## 2020-09-04 DIAGNOSIS — Z85828 Personal history of other malignant neoplasm of skin: Secondary | ICD-10-CM | POA: Diagnosis not present

## 2020-09-04 DIAGNOSIS — D692 Other nonthrombocytopenic purpura: Secondary | ICD-10-CM | POA: Diagnosis not present

## 2020-09-04 DIAGNOSIS — D1801 Hemangioma of skin and subcutaneous tissue: Secondary | ICD-10-CM | POA: Diagnosis not present

## 2020-09-05 ENCOUNTER — Encounter (HOSPITAL_COMMUNITY)
Admission: RE | Admit: 2020-09-05 | Discharge: 2020-09-05 | Disposition: A | Discharge status: Home / Self Care | Payer: Medicare HMO | Source: Ambulatory Visit | Attending: Pulmonary Disease | Admitting: Pulmonary Disease

## 2020-09-05 ENCOUNTER — Other Ambulatory Visit: Payer: Self-pay

## 2020-09-05 DIAGNOSIS — J849 Interstitial pulmonary disease, unspecified: Secondary | ICD-10-CM

## 2020-09-05 DIAGNOSIS — U099 Post covid-19 condition, unspecified: Secondary | ICD-10-CM | POA: Diagnosis not present

## 2020-09-05 NOTE — Progress Notes (Signed)
Pulmonary Individual Treatment Plan  Patient Details  Name: Johnathan [name] MRN: [id number] Date of Birth: 31-Oct-1932 Referring Provider:   [name] Pulmonary Rehab Walk Test from 08/04/2020 in [place]  Referring Provider Dr. [name]       Initial Encounter Date:  Flowsheet Row Pulmonary Rehab Walk Test from 08/04/2020 in [place]  Date 08/04/20       Visit Diagnosis: Interstitial pulmonary disease ([place])  Post covid-19 condition, unspecified  Patient's Home Medications on Admission:   Current Outpatient Medications:    albuterol (PROVENTIL HFA;VENTOLIN HFA) 108 (90 Base) MCG/ACT inhaler, Inhale 2 puffs into the lungs every 4 (four) hours as needed for wheezing or shortness of breath (or coughing)., Disp: 1 Inhaler, Rfl: 0   amLODipine (NORVASC) 10 MG tablet, Take 1 tablet (10 mg total) by mouth daily., Disp: 90 tablet, Rfl: 2   aspirin EC 81 MG tablet, Take 81 mg by mouth daily., Disp: , Rfl:    chlorpheniramine-HYDROcodone (TUSSIONEX) 10-8 MG/5ML SUER, Take 5 mLs by mouth every 12 (twelve) hours., Disp: 140 mL, Rfl: 0   Cyanocobalamin (B-12) 1000 MCG CAPS, Take 1 capsule by mouth daily., Disp: , Rfl:    Flaxseed, Linseed, (FLAXSEED OIL) 1200 MG CAPS, Take 1 capsule by mouth daily., Disp: , Rfl:    Fluticasone-Umeclidin-Vilant (TRELEGY ELLIPTA) 100-62.5-25 MCG/INH AEPB, Inhale 1 puff into the lungs daily., Disp: 120 each, Rfl: 2   furosemide (LASIX) 20 MG tablet, Take 1 tablet (20 mg total) by mouth daily as needed for edema., Disp: 30 tablet, Rfl: 0   Ginkgo Biloba EXTR, Take 1 tablet by mouth 2 (two) times daily., Disp: , Rfl:    ketoconazole (NIZORAL) 2 % shampoo, Apply 1 application topically 2 (two) times a week., Disp: 120 mL, Rfl: 0   meloxicam (MOBIC) 15 MG tablet, Take 1 tablet (15 mg total) by mouth daily. Use as needed for joint pain, Disp: 90 tablet, Rfl: 0   metFORMIN (GLUCOPHAGE) 500 MG tablet, Take  250 mg by mouth daily., Disp: , Rfl:    Misc Natural Products (HIMALAYAN GOJI PO), Take 1 tablet by mouth in the morning and at bedtime. , Disp: , Rfl:    Multiple Vitamin (MULTIVITAMIN WITH MINERALS) TABS tablet, Take 1 tablet by mouth daily., Disp: , Rfl:    omeprazole (PRILOSEC) 20 MG capsule, TAKE 1 CAPSULE TWICE DAILY, Disp: 180 capsule, Rfl: 1   pravastatin (PRAVACHOL) 40 MG tablet, TAKE 1/2 TABLET EVERY DAY, Disp: 45 tablet, Rfl: 1   primidone (MYSOLINE) 50 MG tablet, TAKE 1 TABLET TWICE DAILY, Disp: 180 tablet, Rfl: 1   sildenafil (VIAGRA) 50 MG tablet, Take 1 tablet (50 mg total) by mouth daily as needed for erectile dysfunction., Disp: 10 tablet, Rfl: 3   terazosin (HYTRIN) 1 MG capsule, TAKE 1 CAPSULE TWICE DAILY, Disp: 180 capsule, Rfl: 1   Tetrahydrozoline HCl (VISINE OP), Apply 1 drop to eye daily., Disp: , Rfl:    triamcinolone cream (KENALOG) 0.1 %, Apply 1 application topically 2 (two) times daily., Disp: 30 g, Rfl: 0  Past Medical History: Past Medical History:  Diagnosis Date   COPD (chronic obstructive pulmonary disease) ([place])    Diverticulosis    DM type 2 (diabetes mellitus, type 2) ([facility])    GERD (gastroesophageal reflux disease)    uses prilosec    Hypertension    Pneumonia 2018   Raynaud's disease    Shortness of breath dyspnea  WITH EXERTION   Sinusitis    Tremors of nervous system     Tobacco Use: Social History   Tobacco Use  Smoking Status Former   Packs/day: 1.00   Years: 45.00   Pack years: 45.00   Types: Cigarettes   Quit date: 01/21/1994   Years since quitting: 26.6  Smokeless Tobacco Never    Labs: Recent Review Flowsheet Data     Labs for ITP Cardiac and Pulmonary Rehab Latest Ref Rng & Units 05/06/2019 08/06/2019 10/18/2019 02/25/2020 07/31/2020   Cholestrol <200 mg/dL - 145 125 - -   LDLCALC mg/dL (calc) - 88 67 - -   HDL > OR = 40 mg/dL - 45 44 - -   Trlycerides <150 mg/dL - 60 68 89 -   Hemoglobin A1c 4.6 - 6.5 % - 6.2 6.3(H) 6.5(H)  6.7(H)   TCO2 22 - 32 mmol/L 30 - - - -       Capillary Blood Glucose: Lab Results  Component Value Date   GLUCAP 88 08/10/2020   GLUCAP 80 08/10/2020   GLUCAP 102 (H) 08/08/2020   GLUCAP 91 08/08/2020   GLUCAP 151 (H) 02/29/2020    POCT Glucose     Row Name 08/15/20 1525             POCT Blood Glucose   Pre-Exercise 91 mg/dL       Post-Exercise 102 mg/dL       Pre-Exercise #2 80 mg/dL       Post-Exercise #2 88 mg/dL                Pulmonary Assessment Scores:  Pulmonary Assessment Scores     Row Name 08/04/20 1600         ADL UCSD   ADL Phase Entry     SOB Score total 19           CAT Score   CAT Score 7           mMRC Score   mMRC Score 3             UCSD: Self-administered rating of dyspnea associated with activities of daily living (ADLs) 6-point scale (0 = "not at all" to 5 = "maximal or unable to do because of breathlessness")  Scoring Scores range from 0 to 120.  Minimally important difference is 5 units  CAT: CAT can identify the health impairment of COPD patients and is better correlated with disease progression.  CAT has a scoring range of zero to 40. The CAT score is classified into four groups of low (less than 10), medium (10 - 20), high (21-30) and very high (31-40) based on the impact level of disease on health status. A CAT score over 10 suggests significant symptoms.  A worsening CAT score could be explained by an exacerbation, poor medication adherence, poor inhaler technique, or progression of COPD or comorbid conditions.  CAT MCID is 2 points  mMRC: mMRC (Modified Medical Research Council) Dyspnea Scale is used to assess the degree of baseline functional disability in patients of respiratory disease due to dyspnea. No minimal important difference is established. A decrease in score of 1 point or greater is considered a positive change.   Pulmonary Function Assessment:  Pulmonary Function Assessment - 08/04/20 1422        Breath   Shortness of Breath Yes;Limiting activity             Exercise Target Goals: Exercise Program Goal: Individual exercise prescription  set using results from initial 6 min walk test and THRR while considering  patient's activity barriers and safety.   Exercise Prescription Goal: Initial exercise prescription builds to 30-45 minutes a day of aerobic activity, 2-3 days per week.  Home exercise guidelines will be given to patient during program as part of exercise prescription that the participant will acknowledge.  Activity Barriers & Risk Stratification:  Activity Barriers & Cardiac Risk Stratification - 08/04/20 1417       Activity Barriers & Cardiac Risk Stratification   Activity Barriers Shortness of Breath;Deconditioning;Muscular Weakness             6 Minute Walk:  6 Minute Walk     Row Name 08/04/20 1551         6 Minute Walk   Phase Initial     Distance 1052 feet     Walk Time 6 minutes     # of Rest Breaks 1  1 standing rest break for 30 seconds due to SOB     MPH 1.99     METS 2.31     RPE 13     Perceived Dyspnea  3     VO2 Peak 8.08     Symptoms No     Resting HR 71 bpm     Resting BP 144/62     Resting Oxygen Saturation  96 %     Exercise Oxygen Saturation  during 6 min walk 88 %     Max Ex. HR 103 bpm     Max Ex. BP 190/58     2 Minute Post BP 152/64           Interval HR   1 Minute HR 90     2 Minute HR 97     3 Minute HR 97     4 Minute HR 90     5 Minute HR 101     6 Minute HR 103     2 Minute Post HR 79     Interval Heart Rate? Yes           Interval Oxygen   Interval Oxygen? Yes     Baseline Oxygen Saturation % 96 %     1 Minute Oxygen Saturation % 98 %     1 Minute Liters of Oxygen 2 L     2 Minute Oxygen Saturation % 97 %     2 Minute Liters of Oxygen 2 L     3 Minute Oxygen Saturation % 92 %     3 Minute Liters of Oxygen 2 L     4 Minute Oxygen Saturation % 88 %     4 Minute Liters of Oxygen 2 L     5 Minute  Oxygen Saturation % 90 %     5 Minute Liters of Oxygen 2 L     6 Minute Oxygen Saturation % 89 %     6 Minute Liters of Oxygen 2 L     2 Minute Post Oxygen Saturation % 96 %     2 Minute Post Liters of Oxygen 2 L              Oxygen Initial Assessment:  Oxygen Initial Assessment - 08/04/20 1422       Home Oxygen   Home Oxygen Device Portable Concentrator;Home Concentrator    Sleep Oxygen Prescription Continuous    Liters per minute 2    Home Exercise Oxygen Prescription Continuous  Liters per minute 2    Home Resting Oxygen Prescription Continuous    Liters per minute 2    Compliance with Home Oxygen Use Yes      Initial 6 min Walk   Oxygen Used Continuous    Liters per minute 2      Program Oxygen Prescription   Program Oxygen Prescription Continuous    Liters per minute 2      Intervention   Short Term Goals To learn and exhibit compliance with exercise, home and travel O2 prescription;To learn and understand importance of monitoring SPO2 with pulse oximeter and demonstrate accurate use of the pulse oximeter.;To learn and understand importance of maintaining oxygen saturations>88%;To learn and demonstrate proper pursed lip breathing techniques or other breathing techniques. ;To learn and demonstrate proper use of respiratory medications    Long  Term Goals Exhibits compliance with exercise, home  and travel O2 prescription;Verbalizes importance of monitoring SPO2 with pulse oximeter and return demonstration;Maintenance of O2 saturations>88%;Exhibits proper breathing techniques, such as pursed lip breathing or other method taught during program session;Compliance with respiratory medication;Demonstrates proper use of MDI's             Oxygen Re-Evaluation:  Oxygen Re-Evaluation     Row Name 08/07/20 1625 09/05/20 0843           Program Oxygen Prescription   Program Oxygen Prescription Continuous Continuous;E-Tanks      Liters per minute 2 2      Comments Pt  is scheduled to start program this week. We will monitor oxygen saturation and titrate as needed. --             Home Oxygen   Home Oxygen Device Portable Concentrator;Home Concentrator Home Concentrator;Portable Concentrator      Sleep Oxygen Prescription Continuous Continuous      Liters per minute 2 2      Home Exercise Oxygen Prescription Continuous Continuous      Liters per minute 2 2      Home Resting Oxygen Prescription Continuous Pulsed      Liters per minute 2 2      Compliance with Home Oxygen Use Yes Yes             Goals/Expected Outcomes   Short Term Goals To learn and exhibit compliance with exercise, home and travel O2 prescription;To learn and understand importance of monitoring SPO2 with pulse oximeter and demonstrate accurate use of the pulse oximeter.;To learn and understand importance of maintaining oxygen saturations>88%;To learn and demonstrate proper pursed lip breathing techniques or other breathing techniques. ;To learn and demonstrate proper use of respiratory medications --      Long  Term Goals Exhibits compliance with exercise, home  and travel O2 prescription;Verbalizes importance of monitoring SPO2 with pulse oximeter and return demonstration;Maintenance of O2 saturations>88%;Exhibits proper breathing techniques, such as pursed lip breathing or other method taught during program session;Compliance with respiratory medication;Demonstrates proper use of MDI's --      Comments -- Compliant with supplemental oxygen use      Goals/Expected Outcomes compliance and understanding of monitoring oxygen saturation at home and understanding importance of pursed lip breathing. --               Oxygen Discharge (Final Oxygen Re-Evaluation):  Oxygen Re-Evaluation - 09/05/20 0843       Program Oxygen Prescription   Program Oxygen Prescription Continuous;E-Tanks    Liters per minute 2      Home Oxygen   Home  Oxygen Device Home Concentrator;Portable Concentrator     Sleep Oxygen Prescription Continuous    Liters per minute 2    Home Exercise Oxygen Prescription Continuous    Liters per minute 2    Home Resting Oxygen Prescription Pulsed    Liters per minute 2    Compliance with Home Oxygen Use Yes      Goals/Expected Outcomes   Comments Compliant with supplemental oxygen use             Initial Exercise Prescription:  Initial Exercise Prescription - 08/04/20 1500       Date of Initial Exercise RX and Referring Provider   Date 08/04/20    Referring Provider Dr. [name]    Expected Discharge Date 10/05/20      Oxygen   Oxygen Continuous    Liters 2      NuStep   Level 1    SPM 80    Minutes 15      Arm Ergometer   Level 1    Minutes 15      Prescription Details   Frequency (times per week) 2    Duration Progress to 30 minutes of continuous aerobic without signs/symptoms of physical distress      Intensity   THRR 40-80% of Max Heartrate 54-108    Ratings of Perceived Exertion 11-13    Perceived Dyspnea 0-4      Progression   Progression Continue to progress workloads to maintain intensity without signs/symptoms of physical distress.      Resistance Training   Training Prescription Yes    Weight blue bands    Reps 10-15             Perform Capillary Blood Glucose checks as needed.  Exercise Prescription Changes:   Exercise Prescription Changes     Row Name 08/15/20 1500 08/29/20 1500           Response to Exercise   Blood Pressure (Admit) 120/64 136/64      Blood Pressure (Exercise) 120/60 144/76      Blood Pressure (Exit) 124/60 102/52      Heart Rate (Admit) 74 bpm 81 bpm      Heart Rate (Exercise) 90 bpm 94 bpm      Heart Rate (Exit) 75 bpm 81 bpm      Oxygen Saturation (Admit) 95 % 94 %      Oxygen Saturation (Exercise) 95 % 96 %      Oxygen Saturation (Exit) 95 % 97 %      Rating of Perceived Exertion (Exercise) 13 13      Perceived Dyspnea (Exercise) 1 1      Duration Progress to 30 minutes of   aerobic without signs/symptoms of physical distress Continue with 30 min of aerobic exercise without signs/symptoms of physical distress.      Intensity -- Other (comment)  40-80% HR Max             Progression   Progression Continue to progress workloads to maintain intensity without signs/symptoms of physical distress. Continue to progress workloads to maintain intensity without signs/symptoms of physical distress.             Resistance Training   Training Prescription Yes Yes      Weight blue bands blue bands      Reps 10-15 10-15      Time 10 Minutes 10 Minutes             Oxygen  Oxygen Continuous Continuous      Liters 2 2             NuStep   Level 1 3      SPM 80 80      Minutes 15 15      METs 2.1 2             Arm Ergometer   Level 1 1      Minutes 15 15      METs 2.1 --               Exercise Comments:   Exercise Comments     Row Name 08/08/20 1457           Exercise Comments Pt completed first day of exercise and tolerated well with no complaints or concerns. He was able to do 15 minutes on the Nustep and 15 minutes on the Arm Ergometer. He was also able to do the warm up/cool down and resistance exercises with no issues. Will continue to monitor.                Exercise Goals and Review:   Exercise Goals     Row Name 08/04/20 1542             Exercise Goals   Increase Physical Activity Yes       Intervention Provide advice, education, support and counseling about physical activity/exercise needs.;Develop an individualized exercise prescription for aerobic and resistive training based on initial evaluation findings, risk stratification, comorbidities and participant's personal goals.       Expected Outcomes Short Term: Attend rehab on a regular basis to increase amount of physical activity.;Long Term: Add in home exercise to make exercise part of routine and to increase amount of physical activity.;Long Term: Exercising regularly at  least 3-5 days a week.       Increase Strength and Stamina Yes       Intervention Provide advice, education, support and counseling about physical activity/exercise needs.;Develop an individualized exercise prescription for aerobic and resistive training based on initial evaluation findings, risk stratification, comorbidities and participant's personal goals.       Expected Outcomes Short Term: Increase workloads from initial exercise prescription for resistance, speed, and METs.;Short Term: Perform resistance training exercises routinely during rehab and add in resistance training at home;Long Term: Improve cardiorespiratory fitness, muscular endurance and strength as measured by increased METs and functional capacity (6MWT)       Able to understand and use rate of perceived exertion (RPE) scale Yes       Intervention Provide education and explanation on how to use RPE scale       Expected Outcomes Short Term: Able to use RPE daily in rehab to express subjective intensity level;Long Term:  Able to use RPE to guide intensity level when exercising independently       Able to understand and use Dyspnea scale Yes       Intervention Provide education and explanation on how to use Dyspnea scale       Expected Outcomes Short Term: Able to use Dyspnea scale daily in rehab to express subjective sense of shortness of breath during exertion;Long Term: Able to use Dyspnea scale to guide intensity level when exercising independently       Knowledge and understanding of Target Heart Rate Range (THRR) Yes       Intervention Provide education and explanation of THRR including how the numbers were predicted and where they  are located for reference       Expected Outcomes Short Term: Able to state/look up THRR;Long Term: Able to use THRR to govern intensity when exercising independently;Short Term: Able to use daily as guideline for intensity in rehab       Understanding of Exercise Prescription Yes       Intervention  Provide education, explanation, and written materials on patient's individual exercise prescription       Expected Outcomes Short Term: Able to explain program exercise prescription;Long Term: Able to explain home exercise prescription to exercise independently                Exercise Goals Re-Evaluation :  Exercise Goals Re-Evaluation     Row Name 08/07/20 1624             Exercise Goal Re-Evaluation   Exercise Goals Review Increase Physical Activity;Increase Strength and Stamina;Able to understand and use rate of perceived exertion (RPE) scale;Able to understand and use Dyspnea scale;Knowledge and understanding of Target Heart Rate Range (THRR);Understanding of Exercise Prescription       Comments Patient is scheduled to start exercise this week. Will monitor and progress as he is able.       Expected Outcomes Through exercise at rehab and home, the patient will decrease shortness of breath with daily activities and feel confident in carrying out an exercise regimn at home.                Discharge Exercise Prescription (Final Exercise Prescription Changes):  Exercise Prescription Changes - 08/29/20 1500       Response to Exercise   Blood Pressure (Admit) 136/64    Blood Pressure (Exercise) 144/76    Blood Pressure (Exit) 102/52    Heart Rate (Admit) 81 bpm    Heart Rate (Exercise) 94 bpm    Heart Rate (Exit) 81 bpm    Oxygen Saturation (Admit) 94 %    Oxygen Saturation (Exercise) 96 %    Oxygen Saturation (Exit) 97 %    Rating of Perceived Exertion (Exercise) 13    Perceived Dyspnea (Exercise) 1    Duration Continue with 30 min of aerobic exercise without signs/symptoms of physical distress.    Intensity Other (comment)   40-80% HR Max     Progression   Progression Continue to progress workloads to maintain intensity without signs/symptoms of physical distress.      Resistance Training   Training Prescription Yes    Weight blue bands    Reps 10-15    Time 10  Minutes      Oxygen   Oxygen Continuous    Liters 2      NuStep   Level 3    SPM 80    Minutes 15    METs 2      Arm Ergometer   Level 1    Minutes 15             Nutrition:  Target Goals: Understanding of nutrition guidelines, daily intake of sodium '1500mg'$ , cholesterol '200mg'$ , calories 30% from fat and 7% or less from saturated fats, daily to have 5 or more servings of fruits and vegetables.  Biometrics:  Pre Biometrics - 08/04/20 1542       Pre Biometrics   Grip Strength 40.5 kg              Nutrition Therapy Plan and Nutrition Goals:  Nutrition Therapy & Goals - 08/11/20 1026       Nutrition Therapy  Diet General Healthful      Personal Nutrition Goals   Nutrition Goal Pt to build a healthy plate including vegetables, fruits, whole grains, and low-fat dairy products in a heart healthy meal plan    Personal Goal #2 Pt to read labels to reduce sodium intake in frozen meals    Personal Goal #3 Pt to choose non starchy vegetables with dinner and fruit with lunch      Intervention Plan   Intervention Prescribe, educate and counsel regarding individualized specific dietary modifications aiming towards targeted core components such as weight, hypertension, lipid management, diabetes, heart failure and other comorbidities.;Nutrition handout(s) given to patient.    Expected Outcomes Long Term Goal: Adherence to prescribed nutrition plan.;Short Term Goal: Understand basic principles of dietary content, such as calories, fat, sodium, cholesterol and nutrients.             Nutrition Assessments:  MEDIFICTS Score Key: ?70 Need to make dietary changes  40-70 Heart Healthy Diet ? 40 Therapeutic Level Cholesterol Diet  Flowsheet Row PULMONARY REHAB CHRONIC OBSTRUCTIVE PULMONARY DISEASE from 08/08/2020 in [place]  Picture Your Plate Total Score on Admission 62      Picture Your Plate Scores: [id number] Unhealthy dietary pattern  with much room for improvement. 41-50 Dietary pattern unlikely to meet recommendations for good health and room for improvement. 51-60 More healthful dietary pattern, with some room for improvement.  >60 Healthy dietary pattern, although there may be some specific behaviors that could be improved.    Nutrition Goals Re-Evaluation:  Nutrition Goals Re-Evaluation     [place] Name 08/11/20 1027 08/31/20 0825           Goals   Current Weight 163 lb (73.9 kg) 163 lb 2.3 oz (74 kg)      Nutrition Goal Pt to build a healthy plate including vegetables, fruits, whole grains, and low-fat dairy products in a heart healthy meal plan Pt to build a healthy plate including vegetables, fruits, whole grains, and low-fat dairy products in a heart healthy meal plan             Personal Goal #2 Re-Evaluation   Personal Goal #2 Pt to read labels to reduce sodium intake in frozen meals Pt to read labels to reduce sodium intake in frozen meals             Personal Goal #3 Re-Evaluation   Personal Goal #3 Pt to choose non starchy vegetables with dinner and fruit with lunch Pt to choose non starchy vegetables with dinner and fruit with lunch               Nutrition Goals Discharge (Final Nutrition Goals Re-Evaluation):  Nutrition Goals Re-Evaluation - 08/31/20 0825       Goals   Current Weight 163 lb 2.3 oz (74 kg)    Nutrition Goal Pt to build a healthy plate including vegetables, fruits, whole grains, and low-fat dairy products in a heart healthy meal plan      Personal Goal #2 Re-Evaluation   Personal Goal #2 Pt to read labels to reduce sodium intake in frozen meals      Personal Goal #3 Re-Evaluation   Personal Goal #3 Pt to choose non starchy vegetables with dinner and fruit with lunch             Psychosocial: Target Goals: Acknowledge presence or absence of significant depression and/or stress, maximize coping skills, provide positive support system. Participant is able to  verbalize  types and ability to use techniques and skills needed for reducing stress and depression.  Initial Review & Psychosocial Screening:  Initial Psych Review & Screening - 08/04/20 1423       Initial Review   Current issues with None Identified      Family Dynamics   Good Support System? Yes   Roomate and friends   Comments Pt does not display any concerns      Barriers   Psychosocial barriers to participate in program The patient should benefit from training in stress management and relaxation.      Screening Interventions   Interventions Encouraged to exercise    Expected Outcomes Long Term goal: The participant improves quality of Life and PHQ9 Scores as seen by post scores and/or verbalization of changes             Quality of Life Scores:  Scores of 19 and below usually indicate a poorer quality of life in these areas.  A difference of  2-3 points is a clinically meaningful difference.  A difference of 2-3 points in the total score of the Quality of Life Index has been associated with significant improvement in overall quality of life, self-image, physical symptoms, and general health in studies assessing change in quality of life.  PHQ-9: Recent Review Flowsheet Data     Depression screen [facility] 2/9 08/04/2020 06/01/2020 05/30/2020   Decreased Interest 0 0 0   Down, Depressed, Hopeless 0 0 0   PHQ - 2 Score 0 0 0   Altered sleeping 0 - -   Tired, decreased energy 0 - -   Change in appetite 0 - -   Feeling bad or failure about yourself  0 - -   Trouble concentrating 0 - -   Moving slowly or fidgety/restless 0 - -   Suicidal thoughts 0 - -      Interpretation of Total Score  Total Score Depression Severity:  1-4 = Minimal depression, 5-9 = Mild depression, 10-14 = Moderate depression, 15-19 = Moderately severe depression, 20-27 = Severe depression   Psychosocial Evaluation and Intervention:  Psychosocial Evaluation - 08/04/20 1544       Psychosocial Evaluation &  Interventions   Interventions Encouraged to exercise with the program and follow exercise prescription    Comments Pt does not show any signs of depression or any psychosocial concerns. He has a positive outlook on life.    Expected Outcomes For pt to remain free of psychosocial concerns and to maintain mental well being    Continue Psychosocial Services  No Follow up required             Psychosocial Re-Evaluation:  Psychosocial Re-Evaluation     Mertzon Name 08/07/20 1345 09/04/20 1413 09/04/20 1416         Psychosocial Re-Evaluation   Current issues with None Identified None Identified Current Stress Concerns     Comments Elefterios will begin exercising 08/08/2020.  No change in psychosocial concerns since orientation/ walk test. No psychosocial concerns identified at this time. Cecille Rubin is handling what she calls "my new normal".  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  She is making great strides in accepting her new normal.     Expected Outcomes For Jessiel to continue to be free of psychosocial concerns while participating in pulmonary rehab. For Qwentin to continue to be free  of psychosocial concerns while participating in pulmonary rehab. To continue to handle her stress in healthy ways.     Interventions Encouraged to attend Pulmonary Rehabilitation for the exercise Encouraged to attend Pulmonary Rehabilitation for the exercise Encouraged to attend Pulmonary Rehabilitation for the exercise;Relaxation education;Stress management education     Continue Psychosocial Services  No Follow up required No Follow up required No Follow up required           Initial Review   Source of Stress Concerns -- -- Chronic Illness;Unable to participate in former interests or hobbies;Unable to perform yard/household activities              Psychosocial Discharge (Final Psychosocial Re-Evaluation):  Psychosocial  Re-Evaluation - 09/04/20 1416       Psychosocial Re-Evaluation   Current issues with Current Stress Concerns    Comments Cecille Rubin is handling what she calls "my new normal".  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  She is making great strides in accepting her new normal.    Expected Outcomes To continue to handle her stress in healthy ways.    Interventions Encouraged to attend Pulmonary Rehabilitation for the exercise;Relaxation education;Stress management education    Continue Psychosocial Services  No Follow up required      Initial Review   Source of Stress Concerns Chronic Illness;Unable to participate in former interests or hobbies;Unable to perform yard/household activities             Education: Education Goals: Education classes will be provided on a weekly basis, covering required topics. Participant will state understanding/return demonstration of topics presented.  Learning Barriers/Preferences:  Learning Barriers/Preferences - 08/04/20 1426       Learning Barriers/Preferences   Learning Barriers Sight;Hearing   hearing aides in both ears, and wears glasses   Learning Preferences Skilled Demonstration;Individual Instruction;Verbal Instruction             Education Topics: Risk Factor Reduction:  -Group instruction that is supported by a PowerPoint presentation. Instructor discusses the definition of a risk factor, different risk factors for pulmonary disease, and how the heart and lungs work together.     Nutrition for Pulmonary Patient:  -Group instruction provided by PowerPoint slides, verbal discussion, and written materials to support subject matter. The instructor gives an explanation and review of healthy diet recommendations, which includes a discussion on weight management, recommendations for fruit and vegetable consumption, as well as protein, fluid, caffeine,  fiber, sodium, sugar, and alcohol. Tips for eating when patients are short of breath are discussed.   Pursed Lip Breathing:  -Group instruction that is supported by demonstration and informational handouts. Instructor discusses the benefits of pursed lip and diaphragmatic breathing and detailed demonstration on how to preform both.     Oxygen Safety:  -Group instruction provided by PowerPoint, verbal discussion, and written material to support subject matter. There is an overview of "What is Oxygen" and "Why do we need it".  Instructor also reviews how to create a safe environment for oxygen use, the importance of using oxygen as prescribed, and the risks of noncompliance. There is a brief discussion on traveling with oxygen and resources the patient may utilize.   Oxygen Equipment:  -Group instruction provided by [facility] Staff utilizing handouts, written materials, and equipment demonstrations.   Signs and Symptoms:  -Group instruction provided by written material and verbal discussion to  support subject matter. Warning signs and symptoms of infection, stroke, and heart attack are reviewed and when to call the physician/911 reinforced. Tips for preventing the spread of infection discussed.   Advanced Directives:  -Group instruction provided by verbal instruction and written material to support subject matter. Instructor reviews Advanced Directive laws and proper instruction for filling out document.   Pulmonary Video:  -Group video education that reviews the importance of medication and oxygen compliance, exercise, good nutrition, pulmonary hygiene, and pursed lip and diaphragmatic breathing for the pulmonary patient.   Exercise for the Pulmonary Patient:  -Group instruction that is supported by a PowerPoint presentation. Instructor discusses benefits of exercise, core components of exercise, frequency, duration, and intensity of an exercise routine, importance of utilizing pulse  oximetry during exercise, safety while exercising, and options of places to exercise outside of rehab.     Pulmonary Medications:  -Verbally interactive group education provided by instructor with focus on inhaled medications and proper administration. Flowsheet Row PULMONARY REHAB CHRONIC OBSTRUCTIVE PULMONARY DISEASE from 08/31/2020 in [place]  Date 08/24/20  Educator Handout       Anatomy and Physiology of the Respiratory System and Intimacy:  -Group instruction provided by PowerPoint, verbal discussion, and written material to support subject matter. Instructor reviews respiratory cycle and anatomical components of the respiratory system and their functions. Instructor also reviews differences in obstructive and restrictive respiratory diseases with examples of each. Intimacy, Sex, and Sexuality differences are reviewed with a discussion on how relationships can change when diagnosed with pulmonary disease. Common sexual concerns are reviewed. Flowsheet Row PULMONARY REHAB CHRONIC OBSTRUCTIVE PULMONARY DISEASE from 08/31/2020 in [place]  Date 08/31/20  Educator Handout       MD DAY -A group question and answer session with a medical doctor that allows participants to ask questions that relate to their pulmonary disease state.   OTHER EDUCATION -Group or individual verbal, written, or video instructions that support the educational goals of the pulmonary rehab program. [place] from 08/31/2020 in [place]  Date 08/17/20  Educator Berton [place]  Instruction Review Code 1- Verbalizes Understanding       Holiday Eating Survival Tips:  -Group instruction provided by PowerPoint slides, verbal discussion, and written materials to support subject matter. The instructor gives patients tips, tricks, and techniques to  help them not only survive but enjoy the holidays despite the onslaught of food that accompanies the holidays.   Knowledge Questionnaire Score:  Knowledge Questionnaire Score - 08/04/20 1559       Knowledge Questionnaire Score   Pre Score 17/18             Core Components/Risk Factors/Patient Goals at Admission:  Personal Goals and Risk Factors at Admission - 08/04/20 1546       Core Components/Risk Factors/Patient Goals on Admission    Weight Management Weight Maintenance    Improve shortness of breath with ADL's Yes    Intervention Provide education, individualized exercise plan and daily activity instruction to help decrease symptoms of SOB with activities of daily living.    Expected Outcomes Short Term: Improve cardiorespiratory fitness to achieve a reduction of symptoms when performing ADLs;Long Term: Be able to perform more ADLs without symptoms or delay the onset of symptoms    Hypertension Yes    Intervention Provide education on lifestyle modifcations including regular physical activity/exercise, weight management, moderate  sodium restriction and increased consumption of fresh fruit, vegetables, and low fat dairy, alcohol moderation, and smoking cessation.;Monitor prescription use compliance.    Expected Outcomes Short Term: Continued assessment and intervention until BP is < 140/27m HG in hypertensive participants. < 130/843mHG in hypertensive participants with diabetes, heart failure or chronic kidney disease.;Long Term: Maintenance of blood pressure at goal levels.             Core Components/Risk Factors/Patient Goals Review:   Goals and Risk Factor Review     Row Name 08/07/20 1347 09/04/20 1414 09/04/20 1419         Core Components/Risk Factors/Patient Goals Review   Personal Goals Review Develop more efficient breathing techniques such as purse lipped breathing and diaphragmatic breathing and practicing self-pacing with activity.;Increase knowledge of  respiratory medications and ability to use respiratory devices properly.;Improve shortness of breath with ADL's Develop more efficient breathing techniques such as purse lipped breathing and diaphragmatic breathing and practicing self-pacing with activity.;Increase knowledge of respiratory medications and ability to use respiratory devices properly.;Improve shortness of breath with ADL's Develop more efficient breathing techniques such as purse lipped breathing and diaphragmatic breathing and practicing self-pacing with activity.;Increase knowledge of respiratory medications and ability to use respiratory devices properly.;Improve shortness of breath with ADL's     Review CaBiranill begin exercise in pulmonary rehab 08/08/2020. CaNikkis progressing well, he is on level 4 of the nustep and level 1.5 of the arm ergomenter.  He works hard and is increasing his strength and stamina and decreasing his shortness of breath. --     Expected Outcomes See admission goals. See admission goals. See admission goals.              Core Components/Risk Factors/Patient Goals at Discharge (Final Review):   Goals and Risk Factor Review - 09/04/20 1419       Core Components/Risk Factors/Patient Goals Review   Personal Goals Review Develop more efficient breathing techniques such as purse lipped breathing and diaphragmatic breathing and practicing self-pacing with activity.;Increase knowledge of respiratory medications and ability to use respiratory devices properly.;Improve shortness of breath with ADL's    Review --    Expected Outcomes See admission goals.             ITP Comments:   Comments: ITP REVIEW Pt is making expected progress toward pulmonary rehab goals after completing 7 sessions. Recommend continued exercise, life style modification, education, and utilization of breathing techniques to increase stamina and strength and decrease shortness of breath with exertion.

## 2020-09-07 ENCOUNTER — Other Ambulatory Visit: Payer: Self-pay

## 2020-09-07 ENCOUNTER — Encounter (HOSPITAL_COMMUNITY)
Admission: RE | Admit: 2020-09-07 | Discharge: 2020-09-07 | Disposition: A | Discharge status: Home / Self Care | Payer: Medicare HMO | Source: Ambulatory Visit | Attending: Pulmonary Disease | Admitting: Pulmonary Disease

## 2020-09-07 DIAGNOSIS — J849 Interstitial pulmonary disease, unspecified: Secondary | ICD-10-CM | POA: Diagnosis not present

## 2020-09-07 DIAGNOSIS — U099 Post covid-19 condition, unspecified: Secondary | ICD-10-CM | POA: Diagnosis not present

## 2020-09-07 NOTE — Progress Notes (Signed)
Daily Session Note  Patient Details  Name: Johnathan [name] MRN: [id number] Date of Birth: Jun 12, 1932 Referring Provider:   [name] Pulmonary Rehab Walk Test from 08/04/2020 in [place]  Referring Provider Dr. [name]       Encounter Date: 09/07/2020  Check In:  Session Check In - 09/07/20 1421       Check-In   Supervising physician immediately available to respond to emergencies [facility] immediately available    Physician(s) Dr. Mal Misty    Location [facility]    Staff Present Trish Fountain, RN, Quentin Ore, MS, ACSM-CEP, Exercise Physiologist;David Lilyan Punt, MS, ACSM-CEP, CCRP, Exercise Physiologist    Virtual Visit No    Medication changes reported     No    Fall or balance concerns reported    No    Tobacco Cessation No Change    Warm-up and Cool-down Performed as group-led instruction    Resistance Training Performed Yes    VAD Patient? No    PAD/SET Patient? No      Pain Assessment   Currently in Pain? No/denies    Multiple Pain Sites No             Capillary Blood Glucose: No results found for this or any previous visit (from the past 24 hour(s)).    Social History   Tobacco Use  Smoking Status Former   Packs/day: 1.00   Years: 45.00   Pack years: 45.00   Types: Cigarettes   Quit date: 01/21/1994   Years since quitting: 26.6  Smokeless Tobacco Never    Goals Met:  Proper associated with RPD/PD & O2 Sat Exercise tolerated well No report of cardiac concerns or symptoms Strength training completed today  Goals Unmet:  Not Applicable  Comments: Service time is from 1322 to 1440    Dr. [name] is Medical Director for Cardiac Rehab at [facility].

## 2020-09-11 ENCOUNTER — Telehealth: Payer: Self-pay | Admitting: Family Medicine

## 2020-09-11 DIAGNOSIS — J441 Chronic obstructive pulmonary disease with (acute) exacerbation: Secondary | ICD-10-CM

## 2020-09-11 DIAGNOSIS — J8410 Pulmonary fibrosis, unspecified: Secondary | ICD-10-CM

## 2020-09-11 DIAGNOSIS — J849 Interstitial pulmonary disease, unspecified: Secondary | ICD-10-CM

## 2020-09-11 MED ORDER — METFORMIN HCL 500 MG PO TABS: 250.0000 mg | ORAL_TABLET | Freq: Every day | ORAL | 1 refills | Status: DC

## 2020-09-11 NOTE — Telephone Encounter (Signed)
[facility] pharmacy  calling for refill on pt's Metformin '500mg'$ 

## 2020-09-11 NOTE — Telephone Encounter (Signed)
Patient is calling to get a referral for the [facility] Pulmonary rehab at the [place] center. He stated we can contact Winifred Olive for questions and to get him signed up into the program. Her number is [phone]. Please advice.

## 2020-09-11 NOTE — Telephone Encounter (Signed)
Medication refilled to pharmacy.  

## 2020-09-12 ENCOUNTER — Encounter (HOSPITAL_COMMUNITY)
Admission: RE | Admit: 2020-09-12 | Discharge: 2020-09-12 | Disposition: A | Discharge status: Home / Self Care | Payer: Medicare HMO | Source: Ambulatory Visit | Attending: Pulmonary Disease | Admitting: Pulmonary Disease

## 2020-09-12 ENCOUNTER — Other Ambulatory Visit: Payer: Self-pay

## 2020-09-12 ENCOUNTER — Telehealth: Payer: Self-pay

## 2020-09-12 VITALS — Wt 167.1 lb

## 2020-09-12 DIAGNOSIS — J849 Interstitial pulmonary disease, unspecified: Secondary | ICD-10-CM | POA: Diagnosis not present

## 2020-09-12 DIAGNOSIS — U099 Post covid-19 condition, unspecified: Secondary | ICD-10-CM | POA: Diagnosis not present

## 2020-09-12 NOTE — Telephone Encounter (Signed)
Spoke with Pam she states this program is offered in addition to pulmonary rehab. They work with patients at the [facility] for approximately 12 weeks  with strengthening cardio exercises.   She walked me through placing the referral in epic. I have pended referral. Please sign if appropriate for patient to do in addition to pulmonary rehab.

## 2020-09-12 NOTE — Telephone Encounter (Signed)
Left message to return call with Pam. Needing more information on what is needed so we can help the patient.

## 2020-09-12 NOTE — Progress Notes (Signed)
Daily Session Note  Patient Details  Name: Johnathan [name] MRN: [id number] Date of Birth: [birth year] Referring Provider:   [name] Pulmonary Rehab Walk Test from 08/04/2020 in [place]  Referring Provider Dr. [name]       Encounter Date: 09/12/2020  Check In:  Session Check In - 09/12/20 1437       Check-In   Supervising physician immediately available to respond to emergencies [facility] immediately available    Physician(s) Dr. Ernestina Columbia    Staff Present Elmon Else, MS, ACSM-CEP, Exercise Physiologist;Jessica Hassell Done, MS, ACSM-CEP, Exercise Physiologist;Lisa Ysidro Evert, RN    Virtual Visit No    Medication changes reported     No    Tobacco Cessation No Change    Warm-up and Cool-down Performed as group-led instruction    Resistance Training Performed Yes    VAD Patient? No    PAD/SET Patient? No      Pain Assessment   Currently in Pain? No/denies    Pain Score 0-No pain    Multiple Pain Sites No             Capillary Blood Glucose: No results found for this or any previous visit (from the past 24 hour(s)).   Exercise Prescription Changes - 09/12/20 1500       Response to Exercise   Blood Pressure (Admit) 138/64    Blood Pressure (Exercise) 152/64    Blood Pressure (Exit) 120/60    Heart Rate (Admit) 77 bpm    Heart Rate (Exercise) 90 bpm    Heart Rate (Exit) 88 bpm    Oxygen Saturation (Admit) 94 %    Oxygen Saturation (Exercise) 96 %    Oxygen Saturation (Exit) 95 %    Rating of Perceived Exertion (Exercise) 13    Perceived Dyspnea (Exercise) 1    Duration Progress to 30 minutes of  aerobic without signs/symptoms of physical distress    Intensity THRR unchanged      Progression   Progression Continue to progress workloads to maintain intensity without signs/symptoms of physical distress.      Resistance Training   Training Prescription Yes    Weight blue bands    Reps 10-15    Time 10 Minutes      Oxygen    Oxygen Continuous    Liters 2      NuStep   Level 4    SPM 80    Minutes 15    METs 2.1      Arm Ergometer   Level 1    Watts 37    Minutes 15             Social History   Tobacco Use  Smoking Status Former   Packs/day: 1.00   Years: 45.00   Pack years: 45.00   Types: Cigarettes   Quit date: 01/21/1994   Years since quitting: 26.6  Smokeless Tobacco Never    Goals Met:  Exercise tolerated well No report of cardiac concerns or symptoms Strength training completed today  Goals Unmet:  Not Applicable  Comments: Service time is from 1330 to 1442    Dr. [name] is Medical Director for Cardiac Rehab at [facility].

## 2020-09-12 NOTE — Addendum Note (Signed)
Addended by: Lamar Blinks C on: 09/12/2020 07:02 PM   Modules accepted: Orders

## 2020-09-12 NOTE — Telephone Encounter (Signed)
Call from Dr Arlyn Dunning office  Pt called office after visiting [facility] and getting PREP information.  Wants to do both Pulm rehab-and PREP Need MD referral for same Sent info to office on making a referral via Epic along with a flyer on PREP  Will await referral and contact pt reference next start date.

## 2020-09-12 NOTE — Addendum Note (Signed)
Addended by: Wynonia Musty A on: 09/12/2020 02:51 PM   Modules accepted: Orders

## 2020-09-14 ENCOUNTER — Other Ambulatory Visit: Payer: Self-pay

## 2020-09-14 ENCOUNTER — Encounter (HOSPITAL_COMMUNITY)
Admission: RE | Admit: 2020-09-14 | Discharge: 2020-09-14 | Disposition: A | Discharge status: Home / Self Care | Payer: Medicare HMO | Source: Ambulatory Visit | Attending: Pulmonary Disease | Admitting: Pulmonary Disease

## 2020-09-14 DIAGNOSIS — J849 Interstitial pulmonary disease, unspecified: Secondary | ICD-10-CM | POA: Diagnosis not present

## 2020-09-14 DIAGNOSIS — U099 Post covid-19 condition, unspecified: Secondary | ICD-10-CM | POA: Diagnosis not present

## 2020-09-14 NOTE — Progress Notes (Signed)
Daily Session Note  Patient Details  Name: Johnathan [name] MRN: [id number] Date of Birth: [birth year] Referring Provider:   [name] Pulmonary Rehab Walk Test from 08/04/2020 in [place]  Referring Provider Dr. [name]       Encounter Date: 09/14/2020  Check In:  Session Check In - 09/14/20 1436       Check-In   Supervising physician immediately available to respond to emergencies [facility] immediately available    Physician(s) Dr. Sloan Leiter    [facility]    Staff Present Elmon Else, MS, ACSM-CEP, Exercise Physiologist;Jessica Hassell Done, MS, ACSM-CEP, Exercise Physiologist;Aiyla Baucom Ysidro Evert, RN    Virtual Visit No    Medication changes reported     No    Fall or balance concerns reported    No    Tobacco Cessation No Change    Warm-up and Cool-down Performed as group-led instruction    Resistance Training Performed Yes    VAD Patient? No      Pain Assessment   Currently in Pain? No/denies    Pain Score 0-No pain    Multiple Pain Sites No             Capillary Blood Glucose: No results found for this or any previous visit (from the past 24 hour(s)).    Social History   Tobacco Use  Smoking Status Former   Packs/day: 1.00   Years: 45.00   Pack years: 45.00   Types: Cigarettes   Quit date: 01/21/1994   Years since quitting: 26.6  Smokeless Tobacco Never    Goals Met:  Exercise tolerated well No report of concerns or symptoms today Strength training completed today  Goals Unmet:  Not Applicable   Comments: Service time is from 1322 to [place]    Dr. [name] is Medical Director for Cardiac Rehab at [facility].

## 2020-09-19 ENCOUNTER — Other Ambulatory Visit: Payer: Self-pay

## 2020-09-19 ENCOUNTER — Encounter (HOSPITAL_COMMUNITY)
Admission: RE | Admit: 2020-09-19 | Discharge: 2020-09-19 | Disposition: A | Discharge status: Home / Self Care | Payer: Medicare HMO | Source: Ambulatory Visit | Attending: Pulmonary Disease | Admitting: Pulmonary Disease

## 2020-09-19 DIAGNOSIS — U099 Post covid-19 condition, unspecified: Secondary | ICD-10-CM | POA: Diagnosis not present

## 2020-09-19 DIAGNOSIS — J849 Interstitial pulmonary disease, unspecified: Secondary | ICD-10-CM

## 2020-09-19 NOTE — Progress Notes (Signed)
Daily Session Note  Patient Details  Name: Johnathan [name] MRN: [id number] Date of Birth: January 31, 1932 Referring Provider:   [name] Pulmonary Rehab Walk Test from 08/04/2020 in [place]  Referring Provider Dr. [name]       Encounter Date: 09/19/2020  Check In:  Session Check In - 09/19/20 1504       Check-In   Supervising physician immediately available to respond to emergencies [facility] immediately available    Physician(s) Dr. [name]    [facility]    Staff Present Rodney Langton, Cathleen Fears, MS, ACSM-CEP, Exercise Physiologist;Kynadee Dam Leonia Reeves, RN, Deland Pretty, MS, ACSM CEP, Exercise Physiologist    Virtual Visit No    Medication changes reported     No    Fall or balance concerns reported    No    Tobacco Cessation No Change    Warm-up and Cool-down Performed as group-led instruction    Resistance Training Performed Yes    VAD Patient? No    PAD/SET Patient? No      Pain Assessment   Currently in Pain? No/denies    Multiple Pain Sites No             Capillary Blood Glucose: No results found for this or any previous visit (from the past 24 hour(s)).    Social History   Tobacco Use  Smoking Status Former   Packs/day: 1.00   Years: 45.00   Pack years: 45.00   Types: Cigarettes   Quit date: 01/21/1994   Years since quitting: 26.6  Smokeless Tobacco Never    Goals Met:  Proper associated with RPD/PD & O2 Sat Exercise tolerated well No report of concerns or symptoms today Strength training completed today  Goals Unmet:  Not Applicable  Comments: Service time is from 1320 to 88    Dr. [name] is Medical Director for Cardiac Rehab at [facility].

## 2020-09-21 ENCOUNTER — Other Ambulatory Visit: Payer: Self-pay

## 2020-09-21 ENCOUNTER — Encounter (HOSPITAL_COMMUNITY)
Admission: RE | Admit: 2020-09-21 | Discharge: 2020-09-21 | Disposition: A | Discharge status: Home / Self Care | Payer: Medicare HMO | Source: Ambulatory Visit | Attending: Pulmonary Disease | Admitting: Pulmonary Disease

## 2020-09-21 ENCOUNTER — Other Ambulatory Visit: Payer: Self-pay | Admitting: Family Medicine

## 2020-09-21 DIAGNOSIS — U071 COVID-19: Secondary | ICD-10-CM | POA: Diagnosis not present

## 2020-09-21 DIAGNOSIS — J849 Interstitial pulmonary disease, unspecified: Secondary | ICD-10-CM | POA: Insufficient documentation

## 2020-09-21 DIAGNOSIS — U099 Post covid-19 condition, unspecified: Secondary | ICD-10-CM | POA: Insufficient documentation

## 2020-09-21 NOTE — Progress Notes (Signed)
Daily Session Note  Patient Details  Name: Johnathan [name] MRN: [id number] Date of Birth: Dec 25, 1932 Referring Provider:   [name] Pulmonary Rehab Walk Test from 08/04/2020 in [place]  Referring Provider Dr. [name]       Encounter Date: 09/21/2020  Check In:  Session Check In - 09/21/20 1456       Check-In   Supervising physician immediately available to respond to emergencies [facility] immediately available    Physician(s) Dr. [name]    [facility]    Staff Present Rosebud Poles, RN, Quentin Ore, MS, ACSM-CEP, Exercise Physiologist;Lisa Ysidro Evert, RN    Virtual Visit No    Medication changes reported     No    Fall or balance concerns reported    No    Tobacco Cessation No Change    Warm-up and Cool-down Performed as group-led instruction    Resistance Training Performed Yes    VAD Patient? No    PAD/SET Patient? No      Pain Assessment   Currently in Pain? No/denies    Multiple Pain Sites No             Capillary Blood Glucose: No results found for this or any previous visit (from the past 24 hour(s)).    Social History   Tobacco Use  Smoking Status Former   Packs/day: 1.00   Years: 45.00   Pack years: 45.00   Types: Cigarettes   Quit date: 01/21/1994   Years since quitting: 26.6  Smokeless Tobacco Never    Goals Met:  Proper associated with RPD/PD & O2 Sat Exercise tolerated well No report of concerns or symptoms today Strength training completed today  Goals Unmet:  Not Applicable  Comments: Service time is from 1326 to 1430    Dr. [name] is Medical Director for Cardiac Rehab at [facility].

## 2020-09-26 ENCOUNTER — Encounter (HOSPITAL_COMMUNITY)
Admission: RE | Admit: 2020-09-26 | Discharge: 2020-09-26 | Disposition: A | Discharge status: Home / Self Care | Payer: Medicare HMO | Source: Ambulatory Visit | Attending: Pulmonary Disease | Admitting: Pulmonary Disease

## 2020-09-26 ENCOUNTER — Other Ambulatory Visit: Payer: Self-pay

## 2020-09-26 VITALS — Wt 165.6 lb

## 2020-09-26 DIAGNOSIS — J849 Interstitial pulmonary disease, unspecified: Secondary | ICD-10-CM | POA: Diagnosis not present

## 2020-09-26 DIAGNOSIS — U099 Post covid-19 condition, unspecified: Secondary | ICD-10-CM | POA: Diagnosis not present

## 2020-09-26 NOTE — Progress Notes (Signed)
Daily Session Note  Patient Details  Name: Johnathan [name] MRN: [id number] Date of Birth: 10-10-1932 Referring Provider:   [name] Pulmonary Rehab Walk Test from 08/04/2020 in [place]  Referring Provider Dr. [name]       Encounter Date: 09/26/2020  Check In:  Session Check In - 09/26/20 [place]       Check-In   Supervising physician immediately available to respond to emergencies [facility] immediately available    Physician(s) Dr. [name]    Location [facility]    Staff Present Elmon Else, MS, ACSM-CEP, Exercise Physiologist;Lisa Ysidro Evert, RN;Joan Leonia Reeves, RN, BSN    Virtual Visit No    Medication changes reported     No    Fall or balance concerns reported    No    Tobacco Cessation No Change    Warm-up and Cool-down Performed as group-led instruction    Resistance Training Performed Yes    VAD Patient? No    PAD/SET Patient? No      Pain Assessment   Currently in Pain? No/denies    Multiple Pain Sites No             Capillary Blood Glucose: No results found for this or any previous visit (from the past 24 hour(s)).   Exercise Prescription Changes - 09/26/20 1500       Response to Exercise   Blood Pressure (Admit) 150/70    Blood Pressure (Exercise) 150/64    Blood Pressure (Exit) 132/54    Heart Rate (Admit) 74 bpm    Heart Rate (Exercise) 86 bpm    Heart Rate (Exit) 76 bpm    Oxygen Saturation (Admit) 95 %    Oxygen Saturation (Exercise) 97 %    Oxygen Saturation (Exit) 98 %    Rating of Perceived Exertion (Exercise) 13    Perceived Dyspnea (Exercise) 1    Duration Continue with 30 min of aerobic exercise without signs/symptoms of physical distress.    Intensity THRR unchanged      Progression   Progression Continue to progress workloads to maintain intensity without signs/symptoms of physical distress.      Resistance Training   Training Prescription Yes    Weight blue bands    Reps  10-15    Time 10 Minutes      Oxygen   Oxygen Continuous    Liters 2      NuStep   Level 5    SPM 80    Minutes 15    METs 2      Arm Ergometer   Level 2.5    Watts 41    Minutes 15      Home Exercise Plan   Plans to continue exercise at Home (comment)   and [employer] if he can be registered again   Frequency Add 3 additional days to program exercise sessions.   Additional days added based on how he feels   Initial Home Exercises Provided 09/26/20             Social History   Tobacco Use  Smoking Status Former   Packs/day: 1.00   Years: 45.00   Pack years: 45.00   Types: Cigarettes   Quit date: 01/21/1994   Years since quitting: 26.6  Smokeless Tobacco Never    Goals Met:  Proper associated with RPD/PD & O2 Sat Exercise tolerated well No report of concerns or symptoms today Strength training completed today  Goals Unmet:  Not Applicable  Comments: Service time is from 1327 to 31    Dr. [name] is Medical Director for Cardiac Rehab at [facility].

## 2020-09-26 NOTE — Progress Notes (Signed)
I have reviewed a Home Exercise Prescription with [name] . [name] is not currently exercising at home. The patient was advised to exercise 1-3 non-rehab days/wk for 30 min/day  [name] and I discussed strategies to begin home exercise plan. Patient mentioned that he is trying to join the [facility] again through [employer]. Johnathan [name] wants to exercise on the treadmill and/or elliptical. The patient stated that his goals was to register for the [facility] and start exercising there.  The patient stated that they understand the exercise prescription.  We reviewed exercise guidelines, target heart rate during exercise, RPE Scale, weather conditions, endpoints for exercise, warmup and cool down.  Patient is encouraged to come to me with any questions. I will continue to follow up with the patient to assist them with progression and safety.    Sheppard Plumber, MS, ACSM-CEP 09/26/2020 3:25 PM

## 2020-09-27 ENCOUNTER — Other Ambulatory Visit: Payer: Self-pay

## 2020-09-27 MED ORDER — TRIAMCINOLONE ACETONIDE 0.1 % EX CREA: 1.0000 "application " | TOPICAL_CREAM | Freq: Two times a day (BID) | CUTANEOUS | 0 refills | Status: DC

## 2020-09-28 ENCOUNTER — Telehealth: Payer: Self-pay

## 2020-09-28 ENCOUNTER — Other Ambulatory Visit: Payer: Self-pay

## 2020-09-28 ENCOUNTER — Encounter (HOSPITAL_COMMUNITY)
Admission: RE | Admit: 2020-09-28 | Discharge: 2020-09-28 | Disposition: A | Discharge status: Home / Self Care | Payer: Medicare HMO | Source: Ambulatory Visit | Attending: Pulmonary Disease | Admitting: Pulmonary Disease

## 2020-09-28 DIAGNOSIS — J849 Interstitial pulmonary disease, unspecified: Secondary | ICD-10-CM | POA: Diagnosis not present

## 2020-09-28 DIAGNOSIS — U099 Post covid-19 condition, unspecified: Secondary | ICD-10-CM | POA: Diagnosis not present

## 2020-09-28 NOTE — Progress Notes (Signed)
Daily Session Note  Patient Details  Name: Johnathan [name] MRN: [id number] Date of Birth: 10-Sep-1932 Referring Provider:   [name] Pulmonary Rehab Walk Test from 08/04/2020 in [place]  Referring Provider Dr. [name]       Encounter Date: 09/28/2020  Check In:  Session Check In - 09/28/20 1447       Check-In   Supervising physician immediately available to respond to emergencies [facility] immediately available    Physician(s) Dr. Florene Glen    Location [facility]    Staff Present Rosebud Poles, RN, Quentin Ore, MS, ACSM-CEP, Exercise Physiologist;Lisa Ysidro Evert, RN    Virtual Visit Yes    Medication changes reported     No    Fall or balance concerns reported    No    Tobacco Cessation No Change    Warm-up and Cool-down Performed as group-led instruction    Resistance Training Performed Yes    VAD Patient? No    PAD/SET Patient? No      Pain Assessment   Currently in Pain? No/denies    Multiple Pain Sites No             Capillary Blood Glucose: No results found for this or any previous visit (from the past 24 hour(s)).    Social History   Tobacco Use  Smoking Status Former   Packs/day: 1.00   Years: 45.00   Pack years: 45.00   Types: Cigarettes   Quit date: 01/21/1994   Years since quitting: 26.7  Smokeless Tobacco Never    Goals Met:  Proper associated with RPD/PD & O2 Sat Exercise tolerated well No report of concerns or symptoms today Strength training completed today  Goals Unmet:  Not Applicable  Comments: Service time is from 1324 to 1430.    Dr. [name] is Medical Director for Cardiac Rehab at [facility].

## 2020-09-28 NOTE — Telephone Encounter (Signed)
Spoke with pt yesterday about PREP program. Had not yet received referral from MD office. Pt sts he would call them.  Copy of referral received from MD office 9/7 Call placed this am. Will call pt back as soon as I have next class scheduled to start. TBD last week of Sept.

## 2020-10-01 ENCOUNTER — Other Ambulatory Visit: Payer: Self-pay | Admitting: Family

## 2020-10-03 ENCOUNTER — Other Ambulatory Visit: Payer: Self-pay

## 2020-10-03 ENCOUNTER — Encounter (HOSPITAL_COMMUNITY)
Admission: RE | Admit: 2020-10-03 | Discharge: 2020-10-03 | Disposition: A | Discharge status: Home / Self Care | Payer: Medicare HMO | Source: Ambulatory Visit | Attending: Pulmonary Disease | Admitting: Pulmonary Disease

## 2020-10-03 DIAGNOSIS — U099 Post covid-19 condition, unspecified: Secondary | ICD-10-CM

## 2020-10-03 DIAGNOSIS — J849 Interstitial pulmonary disease, unspecified: Secondary | ICD-10-CM

## 2020-10-03 NOTE — Progress Notes (Signed)
Daily Session Note  Patient Details  Name: Johnathan [name] MRN: [id number] Date of Birth: 09/22/1932 Referring Provider:   [name] Pulmonary Rehab Walk Test from 08/04/2020 in [place]  Referring Provider Dr. [name]       Encounter Date: 10/03/2020  Check In:  Session Check In - 10/03/20 1454       Check-In   Supervising physician immediately available to respond to emergencies [facility] immediately available    Physician(s) Dr. Florene Glen    [facility]    Staff Present Rosebud Poles, RN, Quentin Ore, MS, ACSM-CEP, Exercise Physiologist;Carlette Wilber Oliphant, RN, BSN    Virtual Visit Yes    Medication changes reported     No    Fall or balance concerns reported    No    Tobacco Cessation No Change    Warm-up and Cool-down Performed as group-led instruction    Resistance Training Performed Yes    VAD Patient? No      Pain Assessment   Currently in Pain? No/denies    Pain Score 0-No pain    Multiple Pain Sites No             Capillary Blood Glucose: No results found for this or any previous visit (from the past 24 hour(s)).    Social History   Tobacco Use  Smoking Status Former   Packs/day: 1.00   Years: 45.00   Pack years: 45.00   Types: Cigarettes   Quit date: 01/21/1994   Years since quitting: 26.7  Smokeless Tobacco Never    Goals Met:  Proper associated with RPD/PD & O2 Sat Independence with exercise equipment Improved SOB with ADL's Exercise tolerated well Personal goals reviewed Strength training completed today  Goals Unmet:  Not Applicable  Comments: Service time is from 1325 to 1430    Dr. [name] is Medical Director for Cardiac Rehab at [facility].

## 2020-10-04 ENCOUNTER — Telehealth: Payer: Self-pay | Admitting: *Deleted

## 2020-10-04 NOTE — Telephone Encounter (Signed)
Responded to by other means

## 2020-10-04 NOTE — Telephone Encounter (Signed)
Who Is Calling Patient / Member / Family / Caregiver Call Type Triage / Clinical Relationship To Patient Self Return Phone Number [phone] (Primary) Chief Complaint Prescription Refill or Medication Request (non symptomatic) Reason for Call Medication Question / Request Initial Comment Caller is checking on a refill prescription. Pharmacy requested and has not gotten an answer. [place] pharmacy Translation No Disp. Time Eilene Ghazi Time) Disposition Final User 10/04/2020 7:24:26 AM Clinical Call Yes Jimmye Norman, RN, Olin Hauser

## 2020-10-05 ENCOUNTER — Other Ambulatory Visit: Payer: Self-pay

## 2020-10-05 ENCOUNTER — Encounter (HOSPITAL_COMMUNITY)
Admission: RE | Admit: 2020-10-05 | Discharge: 2020-10-05 | Disposition: A | Discharge status: Home / Self Care | Payer: Medicare HMO | Source: Ambulatory Visit | Attending: Pulmonary Disease | Admitting: Pulmonary Disease

## 2020-10-05 ENCOUNTER — Telehealth: Payer: Self-pay

## 2020-10-05 DIAGNOSIS — U099 Post covid-19 condition, unspecified: Secondary | ICD-10-CM | POA: Diagnosis not present

## 2020-10-05 DIAGNOSIS — J849 Interstitial pulmonary disease, unspecified: Secondary | ICD-10-CM

## 2020-10-05 NOTE — Telephone Encounter (Signed)
Call to pt reference next PREP class at [facility] Will start Oct 3rd M/W 1p-215p Confirmed he can do, Intake scheduled for 10/16/20 at 1pm at [facility]

## 2020-10-05 NOTE — Progress Notes (Signed)
Daily Session Note  Patient Details  Name: Johnathan [name] MRN: [id number] Date of Birth: [birth year] Referring Provider:   [name] Pulmonary Rehab Walk Test from 08/04/2020 in [place]  Referring Provider Dr. [name]       Encounter Date: 10/05/2020  Check In:  Session Check In - 10/05/20 1426       Check-In   Supervising physician immediately available to respond to emergencies [facility] immediately available    Physician(s) Dr. Sarajane Jews    Location [facility]    Staff Present Rosebud Poles, RN, Quentin Ore, MS, ACSM-CEP, Exercise Physiologist    Virtual Visit No    Medication changes reported     No    Fall or balance concerns reported    No    Tobacco Cessation No Change    Warm-up and Cool-down Performed as group-led instruction    Resistance Training Performed Yes    VAD Patient? No    PAD/SET Patient? No      Pain Assessment   Currently in Pain? No/denies    Multiple Pain Sites No             Capillary Blood Glucose: No results found for this or any previous visit (from the past 24 hour(s)).    Social History   Tobacco Use  Smoking Status Former   Packs/day: 1.00   Years: 45.00   Pack years: 45.00   Types: Cigarettes   Quit date: 01/21/1994   Years since quitting: 26.7  Smokeless Tobacco Never    Goals Met:  Proper associated with RPD/PD & O2 Sat Exercise tolerated well No report of concerns or symptoms today Strength training completed today  Goals Unmet:  Not Applicable  Comments: Service time is from 1325 to 60    Dr. [name] is Medical Director for Cardiac Rehab at [facility].

## 2020-10-09 DIAGNOSIS — D485 Neoplasm of uncertain behavior of skin: Secondary | ICD-10-CM | POA: Diagnosis not present

## 2020-10-09 DIAGNOSIS — B078 Other viral warts: Secondary | ICD-10-CM | POA: Diagnosis not present

## 2020-10-09 NOTE — Progress Notes (Signed)
Pulmonary Individual Treatment Plan  Patient Details  Name: DONIVAN THAMMAVONG MRN: [id number] Date of Birth: 1932-02-28 Referring Provider:   [name] Pulmonary Rehab Walk Test from 08/04/2020 in [place]  Referring Provider Dr. [name]       Initial Encounter Date:  Flowsheet Row Pulmonary Rehab Walk Test from 08/04/2020 in [place]  Date 08/04/20       Visit Diagnosis: Post covid-19 condition, unspecified  Interstitial pulmonary disease ([place])  Patient's Home Medications on Admission:   Current Outpatient Medications:    ketoconazole (NIZORAL) 2 % shampoo, APPLY TO AFFECTED AREA(S) TOPICALLY TWO TIMES PER WEEK, Disp: 120 mL, Rfl: 1   albuterol (PROVENTIL HFA;VENTOLIN HFA) 108 (90 Base) MCG/ACT inhaler, Inhale 2 puffs into the lungs every 4 (four) hours as needed for wheezing or shortness of breath (or coughing)., Disp: 1 Inhaler, Rfl: 0   amLODipine (NORVASC) 10 MG tablet, Take 1 tablet (10 mg total) by mouth daily., Disp: 90 tablet, Rfl: 2   aspirin EC 81 MG tablet, Take 81 mg by mouth daily., Disp: , Rfl:    chlorpheniramine-HYDROcodone (TUSSIONEX) 10-8 MG/5ML SUER, Take 5 mLs by mouth every 12 (twelve) hours., Disp: 140 mL, Rfl: 0   Cyanocobalamin (B-12) 1000 MCG CAPS, Take 1 capsule by mouth daily., Disp: , Rfl:    Flaxseed, Linseed, (FLAXSEED OIL) 1200 MG CAPS, Take 1 capsule by mouth daily., Disp: , Rfl:    Fluticasone-Umeclidin-Vilant (TRELEGY ELLIPTA) 100-62.5-25 MCG/INH AEPB, Inhale 1 puff into the lungs daily., Disp: 120 each, Rfl: 2   furosemide (LASIX) 20 MG tablet, Take 1 tablet (20 mg total) by mouth daily as needed for edema., Disp: 30 tablet, Rfl: 0   Ginkgo Biloba EXTR, Take 1 tablet by mouth 2 (two) times daily., Disp: , Rfl:    loratadine (CLARITIN) 10 MG tablet, TAKE 1 TABLET EVERY DAY, Disp: 90 tablet, Rfl: 1   meloxicam (MOBIC) 15 MG tablet, Take 1 tablet (15 mg total) by mouth daily. Use as  needed for joint pain, Disp: 90 tablet, Rfl: 0   metFORMIN (GLUCOPHAGE) 500 MG tablet, Take 0.5 tablets (250 mg total) by mouth daily., Disp: 90 tablet, Rfl: 1   Misc Natural Products (HIMALAYAN GOJI PO), Take 1 tablet by mouth in the morning and at bedtime. , Disp: , Rfl:    Multiple Vitamin (MULTIVITAMIN WITH MINERALS) TABS tablet, Take 1 tablet by mouth daily., Disp: , Rfl:    omeprazole (PRILOSEC) 20 MG capsule, TAKE 1 CAPSULE TWICE DAILY, Disp: 180 capsule, Rfl: 1   pravastatin (PRAVACHOL) 40 MG tablet, TAKE 1/2 TABLET EVERY DAY, Disp: 45 tablet, Rfl: 1   primidone (MYSOLINE) 50 MG tablet, TAKE 1 TABLET TWICE DAILY, Disp: 180 tablet, Rfl: 1   sildenafil (VIAGRA) 50 MG tablet, Take 1 tablet (50 mg total) by mouth daily as needed for erectile dysfunction., Disp: 10 tablet, Rfl: 3   terazosin (HYTRIN) 1 MG capsule, TAKE 1 CAPSULE TWICE DAILY, Disp: 180 capsule, Rfl: 1   Tetrahydrozoline HCl (VISINE OP), Apply 1 drop to eye daily., Disp: , Rfl:    triamcinolone cream (KENALOG) 0.1 %, Apply 1 application topically 2 (two) times daily., Disp: 80 g, Rfl: 0  Past Medical History: Past Medical History:  Diagnosis Date   COPD (chronic obstructive pulmonary disease) ([place])    Diverticulosis    DM type 2 (diabetes mellitus, type 2) ([facility])    GERD (gastroesophageal reflux disease)    uses prilosec  Hypertension    Pneumonia 2018   Raynaud's disease    Shortness of breath dyspnea    WITH EXERTION   Sinusitis    Tremors of nervous system     Tobacco Use: Social History   Tobacco Use  Smoking Status Former   Packs/day: 1.00   Years: 45.00   Pack years: 45.00   Types: Cigarettes   Quit date: 01/21/1994   Years since quitting: 26.7  Smokeless Tobacco Never    Labs: Recent Review Flowsheet Data     Labs for ITP Cardiac and Pulmonary Rehab Latest Ref Rng & Units 05/06/2019 08/06/2019 10/18/2019 02/25/2020 07/31/2020   Cholestrol <200 mg/dL - 145 125 - -   LDLCALC mg/dL (calc) - 88 67 - -    HDL > OR = 40 mg/dL - 45 44 - -   Trlycerides <150 mg/dL - 60 68 89 -   Hemoglobin A1c 4.6 - 6.5 % - 6.2 6.3(H) 6.5(H) 6.7(H)   TCO2 22 - 32 mmol/L 30 - - - -       Capillary Blood Glucose: Lab Results  Component Value Date   GLUCAP 88 08/10/2020   GLUCAP 80 08/10/2020   GLUCAP 102 (H) 08/08/2020   GLUCAP 91 08/08/2020   GLUCAP 151 (H) 02/29/2020    POCT Glucose     Row Name 08/15/20 1525             POCT Blood Glucose   Pre-Exercise 91 mg/dL       Post-Exercise 102 mg/dL       Pre-Exercise #2 80 mg/dL       Post-Exercise #2 88 mg/dL                Pulmonary Assessment Scores:  Pulmonary Assessment Scores     Row Name 08/04/20 1600 10/05/20 1512       ADL UCSD   ADL Phase Entry Exit    SOB Score total 19 60         CAT Score   CAT Score 7 15         mMRC Score   mMRC Score 3 --            UCSD: Self-administered rating of dyspnea associated with activities of daily living (ADLs) 6-point scale (0 = "not at all" to 5 = "maximal or unable to do because of breathlessness")  Scoring Scores range from 0 to 120.  Minimally important difference is 5 units  CAT: CAT can identify the health impairment of COPD patients and is better correlated with disease progression.  CAT has a scoring range of zero to 40. The CAT score is classified into four groups of low (less than 10), medium (10 - 20), high (21-30) and very high (31-40) based on the impact level of disease on health status. A CAT score over 10 suggests significant symptoms.  A worsening CAT score could be explained by an exacerbation, poor medication adherence, poor inhaler technique, or progression of COPD or comorbid conditions.  CAT MCID is 2 points  mMRC: mMRC (Modified Medical Research Council) Dyspnea Scale is used to assess the degree of baseline functional disability in patients of respiratory disease due to dyspnea. No minimal important difference is established. A decrease in score of 1  point or greater is considered a positive change.   Pulmonary Function Assessment:  Pulmonary Function Assessment - 08/04/20 1422       Breath   Shortness of Breath Yes;Limiting activity  Exercise Target Goals: Exercise Program Goal: Individual exercise prescription set using results from initial 6 min walk test and THRR while considering  patient's activity barriers and safety.   Exercise Prescription Goal: Initial exercise prescription builds to 30-45 minutes a day of aerobic activity, 2-3 days per week.  Home exercise guidelines will be given to patient during program as part of exercise prescription that the participant will acknowledge.  Activity Barriers & Risk Stratification:  Activity Barriers & Cardiac Risk Stratification - 08/04/20 1417       Activity Barriers & Cardiac Risk Stratification   Activity Barriers Shortness of Breath;Deconditioning;Muscular Weakness             6 Minute Walk:  6 Minute Walk     Row Name 08/04/20 1551         6 Minute Walk   Phase Initial     Distance 1052 feet     Walk Time 6 minutes     # of Rest Breaks 1  1 standing rest break for 30 seconds due to SOB     MPH 1.99     METS 2.31     RPE 13     Perceived Dyspnea  3     VO2 Peak 8.08     Symptoms No     Resting HR 71 bpm     Resting BP 144/62     Resting Oxygen Saturation  96 %     Exercise Oxygen Saturation  during 6 min walk 88 %     Max Ex. HR 103 bpm     Max Ex. BP 190/58     2 Minute Post BP 152/64           Interval HR   1 Minute HR 90     2 Minute HR 97     3 Minute HR 97     4 Minute HR 90     5 Minute HR 101     6 Minute HR 103     2 Minute Post HR 79     Interval Heart Rate? Yes           Interval Oxygen   Interval Oxygen? Yes     Baseline Oxygen Saturation % 96 %     1 Minute Oxygen Saturation % 98 %     1 Minute Liters of Oxygen 2 L     2 Minute Oxygen Saturation % 97 %     2 Minute Liters of Oxygen 2 L     3 Minute Oxygen  Saturation % 92 %     3 Minute Liters of Oxygen 2 L     4 Minute Oxygen Saturation % 88 %     4 Minute Liters of Oxygen 2 L     5 Minute Oxygen Saturation % 90 %     5 Minute Liters of Oxygen 2 L     6 Minute Oxygen Saturation % 89 %     6 Minute Liters of Oxygen 2 L     2 Minute Post Oxygen Saturation % 96 %     2 Minute Post Liters of Oxygen 2 L              Oxygen Initial Assessment:  Oxygen Initial Assessment - 10/03/20 0824       Home Oxygen   Home Oxygen Device Home Concentrator;Portable Concentrator    Sleep Oxygen Prescription Continuous    Liters per minute 2  Home Exercise Oxygen Prescription Continuous    Liters per minute 2    Home Resting Oxygen Prescription Pulsed    Liters per minute 2    Compliance with Home Oxygen Use Yes      Program Oxygen Prescription   Program Oxygen Prescription Continuous;E-Tanks    Liters per minute 2    Comments Eulon is compliant with supplemental oxygen.      Intervention   Short Term Goals To learn and exhibit compliance with exercise, home and travel O2 prescription;To learn and understand importance of monitoring SPO2 with pulse oximeter and demonstrate accurate use of the pulse oximeter.;To learn and understand importance of maintaining oxygen saturations>88%;To learn and demonstrate proper pursed lip breathing techniques or other breathing techniques. ;To learn and demonstrate proper use of respiratory medications    Long  Term Goals Exhibits compliance with exercise, home  and travel O2 prescription;Verbalizes importance of monitoring SPO2 with pulse oximeter and return demonstration;Maintenance of O2 saturations>88%;Exhibits proper breathing techniques, such as pursed lip breathing or other method taught during program session;Compliance with respiratory medication;Demonstrates proper use of MDI's             Oxygen Re-Evaluation:  Oxygen Re-Evaluation     Row Name 08/07/20 1625 09/05/20 0843 10/03/20 0824          Program Oxygen Prescription   Program Oxygen Prescription Continuous Continuous;E-Tanks --     Liters per minute 2 2 --     Comments Pt is scheduled to start program this week. We will monitor oxygen saturation and titrate as needed. -- --           Home Oxygen   Home Oxygen Device Portable Concentrator;Home Concentrator Home Concentrator;Portable Concentrator --     Sleep Oxygen Prescription Continuous Continuous --     Liters per minute 2 2 --     Home Exercise Oxygen Prescription Continuous Continuous --     Liters per minute 2 2 --     Home Resting Oxygen Prescription Continuous Pulsed --     Liters per minute 2 2 --     Compliance with Home Oxygen Use Yes Yes --           Goals/Expected Outcomes   Short Term Goals To learn and exhibit compliance with exercise, home and travel O2 prescription;To learn and understand importance of monitoring SPO2 with pulse oximeter and demonstrate accurate use of the pulse oximeter.;To learn and understand importance of maintaining oxygen saturations>88%;To learn and demonstrate proper pursed lip breathing techniques or other breathing techniques. ;To learn and demonstrate proper use of respiratory medications -- --     Long  Term Goals Exhibits compliance with exercise, home  and travel O2 prescription;Verbalizes importance of monitoring SPO2 with pulse oximeter and return demonstration;Maintenance of O2 saturations>88%;Exhibits proper breathing techniques, such as pursed lip breathing or other method taught during program session;Compliance with respiratory medication;Demonstrates proper use of MDI's -- --     Comments -- Compliant with supplemental oxygen use --     Goals/Expected Outcomes compliance and understanding of monitoring oxygen saturation at home and understanding importance of pursed lip breathing. -- Compliance and understanding of monitoring oxygen saturation at home and understanding importance of pursed lip breathing.               Oxygen Discharge (Final Oxygen Re-Evaluation):  Oxygen Re-Evaluation - 10/03/20 0824       Goals/Expected Outcomes   Goals/Expected Outcomes Compliance and understanding of monitoring oxygen saturation at home  and understanding importance of pursed lip breathing.             Initial Exercise Prescription:  Initial Exercise Prescription - 08/04/20 1500       Date of Initial Exercise RX and Referring Provider   Date 08/04/20    Referring Provider Dr. [name]    Expected Discharge Date 10/05/20      Oxygen   Oxygen Continuous    Liters 2      NuStep   Level 1    SPM 80    Minutes 15      Arm Ergometer   Level 1    Minutes 15      Prescription Details   Frequency (times per week) 2    Duration Progress to 30 minutes of continuous aerobic without signs/symptoms of physical distress      Intensity   THRR 40-80% of Max Heartrate 54-108    Ratings of Perceived Exertion 11-13    Perceived Dyspnea 0-4      Progression   Progression Continue to progress workloads to maintain intensity without signs/symptoms of physical distress.      Resistance Training   Training Prescription Yes    Weight blue bands    Reps 10-15             Perform Capillary Blood Glucose checks as needed.  Exercise Prescription Changes:   Exercise Prescription Changes     Row Name 08/15/20 1500 08/29/20 1500 09/12/20 1500 09/26/20 1500       Response to Exercise   Blood Pressure (Admit) 120/64 136/64 138/64 150/70    Blood Pressure (Exercise) 120/60 144/76 152/64 150/64    Blood Pressure (Exit) 124/60 102/52 120/60 132/54    Heart Rate (Admit) 74 bpm 81 bpm 77 bpm 74 bpm    Heart Rate (Exercise) 90 bpm 94 bpm 90 bpm 86 bpm    Heart Rate (Exit) 75 bpm 81 bpm 88 bpm 76 bpm    Oxygen Saturation (Admit) 95 % 94 % 94 % 95 %    Oxygen Saturation (Exercise) 95 % 96 % 96 % 97 %    Oxygen Saturation (Exit) 95 % 97 % 95 % 98 %    Rating of Perceived Exertion (Exercise) 13 13 13 13      Perceived Dyspnea (Exercise) 1 1 1 1     Duration Progress to 30 minutes of  aerobic without signs/symptoms of physical distress Continue with 30 min of aerobic exercise without signs/symptoms of physical distress. Progress to 30 minutes of  aerobic without signs/symptoms of physical distress Continue with 30 min of aerobic exercise without signs/symptoms of physical distress.    Intensity -- Other (comment)  40-80% HR Max THRR unchanged THRR unchanged         Progression   Progression Continue to progress workloads to maintain intensity without signs/symptoms of physical distress. Continue to progress workloads to maintain intensity without signs/symptoms of physical distress. Continue to progress workloads to maintain intensity without signs/symptoms of physical distress. Continue to progress workloads to maintain intensity without signs/symptoms of physical distress.         Resistance Training   Training Prescription Yes Yes Yes Yes    Weight blue bands blue bands blue bands blue bands    Reps 10-15 10-15 10-15 10-15    Time 10 Minutes 10 Minutes 10 Minutes 10 Minutes         Oxygen   Oxygen Continuous Continuous Continuous Continuous    Liters 2 2  2 2         NuStep   Level 1 3 4 5     SPM 80 80 80 80    Minutes 15 15 15 15     METs 2.1 2 2.1 2         Arm Ergometer   Level 1 1 1  2.5    Watts -- -- 37 41    Minutes 15 15 15 15     METs 2.1 -- -- --         Home Exercise Plan   Plans to continue exercise at -- -- -- Home (comment)  and [employer] if he can be registered again    Frequency -- -- -- Add 3 additional days to program exercise sessions.  Additional days added based on how he feels    Initial Home Exercises Provided -- -- -- 09/26/20             Exercise Comments:   Exercise Comments     Row Name 08/08/20 1457 09/26/20 1518         Exercise Comments Pt completed first day of exercise and tolerated well with no complaints or concerns. He was able to do 15  minutes on the Nustep and 15 minutes on the Arm Ergometer. He was also able to do the warm up/cool down and resistance exercises with no issues. Will continue to monitor. Completed home exercise plan. Perrion is not currently exercising at home. Mentioned to Eiden what the Physical Activity Guidelines recommend. I recommended adding in at least 1 non-rehab day/week of exercise to start out. Kalyb said that he could add in 3 non-rehab days/wk. I also encouraged Jasaun to exercise 3x10 min, 2x15 min, or 30 min/day. Pt is aware of target heart range of 54-108 bpm. He said that he will try to get back into the [facility] and exercise on the treadmill and/or elliptical.               Exercise Goals and Review:   Exercise Goals     Row Name 08/04/20 1542 10/03/20 0816           Exercise Goals   Increase Physical Activity Yes Yes      Intervention Provide advice, education, support and counseling about physical activity/exercise needs.;Develop an individualized exercise prescription for aerobic and resistive training based on initial evaluation findings, risk stratification, comorbidities and participant's personal goals. Provide advice, education, support and counseling about physical activity/exercise needs.;Develop an individualized exercise prescription for aerobic and resistive training based on initial evaluation findings, risk stratification, comorbidities and participant's personal goals.      Expected Outcomes Short Term: Attend rehab on a regular basis to increase amount of physical activity.;Long Term: Add in home exercise to make exercise part of routine and to increase amount of physical activity.;Long Term: Exercising regularly at least 3-5 days a week. Short Term: Attend rehab on a regular basis to increase amount of physical activity.;Long Term: Add in home exercise to make exercise part of routine and to increase amount of physical activity.;Long Term: Exercising regularly at least 3-5 days a week.       Increase Strength and Stamina Yes Yes      Intervention Provide advice, education, support and counseling about physical activity/exercise needs.;Develop an individualized exercise prescription for aerobic and resistive training based on initial evaluation findings, risk stratification, comorbidities and participant's personal goals. Provide advice, education, support and counseling about physical activity/exercise needs.;Develop an individualized exercise prescription for aerobic and  resistive training based on initial evaluation findings, risk stratification, comorbidities and participant's personal goals.      Expected Outcomes Short Term: Increase workloads from initial exercise prescription for resistance, speed, and METs.;Short Term: Perform resistance training exercises routinely during rehab and add in resistance training at home;Long Term: Improve cardiorespiratory fitness, muscular endurance and strength as measured by increased METs and functional capacity (6MWT) Short Term: Increase workloads from initial exercise prescription for resistance, speed, and METs.;Short Term: Perform resistance training exercises routinely during rehab and add in resistance training at home;Long Term: Improve cardiorespiratory fitness, muscular endurance and strength as measured by increased METs and functional capacity (6MWT)      Able to understand and use rate of perceived exertion (RPE) scale Yes Yes      Intervention Provide education and explanation on how to use RPE scale Provide education and explanation on how to use RPE scale      Expected Outcomes Short Term: Able to use RPE daily in rehab to express subjective intensity level;Long Term:  Able to use RPE to guide intensity level when exercising independently Short Term: Able to use RPE daily in rehab to express subjective intensity level;Long Term:  Able to use RPE to guide intensity level when exercising independently      Able to understand and use  Dyspnea scale Yes Yes      Intervention Provide education and explanation on how to use Dyspnea scale Provide education and explanation on how to use Dyspnea scale      Expected Outcomes Short Term: Able to use Dyspnea scale daily in rehab to express subjective sense of shortness of breath during exertion;Long Term: Able to use Dyspnea scale to guide intensity level when exercising independently Short Term: Able to use Dyspnea scale daily in rehab to express subjective sense of shortness of breath during exertion;Long Term: Able to use Dyspnea scale to guide intensity level when exercising independently      Knowledge and understanding of Target Heart Rate Range (THRR) Yes Yes      Intervention Provide education and explanation of THRR including how the numbers were predicted and where they are located for reference Provide education and explanation of THRR including how the numbers were predicted and where they are located for reference      Expected Outcomes Short Term: Able to state/look up THRR;Long Term: Able to use THRR to govern intensity when exercising independently;Short Term: Able to use daily as guideline for intensity in rehab Short Term: Able to state/look up THRR;Long Term: Able to use THRR to govern intensity when exercising independently;Short Term: Able to use daily as guideline for intensity in rehab      Understanding of Exercise Prescription Yes Yes      Intervention Provide education, explanation, and written materials on patient's individual exercise prescription Provide education, explanation, and written materials on patient's individual exercise prescription      Expected Outcomes Short Term: Able to explain program exercise prescription;Long Term: Able to explain home exercise prescription to exercise independently Short Term: Able to explain program exercise prescription;Long Term: Able to explain home exercise prescription to exercise independently               Exercise  Goals Re-Evaluation :  Exercise Goals Re-Evaluation     Row Name 08/07/20 1624 10/03/20 0817           Exercise Goal Re-Evaluation   Exercise Goals Review Increase Physical Activity;Increase Strength and Stamina;Able to understand and use rate of  perceived exertion (RPE) scale;Able to understand and use Dyspnea scale;Knowledge and understanding of Target Heart Rate Range (THRR);Understanding of Exercise Prescription Increase Physical Activity;Increase Strength and Stamina;Able to understand and use rate of perceived exertion (RPE) scale;Able to understand and use Dyspnea scale;Knowledge and understanding of Target Heart Rate Range (THRR);Understanding of Exercise Prescription      Comments Patient is scheduled to start exercise this week. Will monitor and progress as he is able. Jaylyn has completed 15 exercise sessions. He tolerates exercise well on the Nustep and arm ergometer. Jaeshawn has averages 2.3 METs at leve 5 on the Nustep and 42 rpm on the arm ergometer. He performs the warmup and cooldown standing up without limitations. He does exercise at home currently but is motivated to once he is able to attend the [facility]. Will continue to monitor and progress as able.      Expected Outcomes Through exercise at rehab and home, the patient will decrease shortness of breath with daily activities and feel confident in carrying out an exercise regimn at home. Through exercise at rehab and home, the patient will decrease shortness of breath with daily activities and feel confident in carrying out an exercise regimn at home.               Discharge Exercise Prescription (Final Exercise Prescription Changes):  Exercise Prescription Changes - 09/26/20 1500       Response to Exercise   Blood Pressure (Admit) 150/70    Blood Pressure (Exercise) 150/64    Blood Pressure (Exit) 132/54    Heart Rate (Admit) 74 bpm    Heart Rate (Exercise) 86 bpm    Heart Rate (Exit) 76 bpm    Oxygen Saturation (Admit) 95  %    Oxygen Saturation (Exercise) 97 %    Oxygen Saturation (Exit) 98 %    Rating of Perceived Exertion (Exercise) 13    Perceived Dyspnea (Exercise) 1    Duration Continue with 30 min of aerobic exercise without signs/symptoms of physical distress.    Intensity THRR unchanged      Progression   Progression Continue to progress workloads to maintain intensity without signs/symptoms of physical distress.      Resistance Training   Training Prescription Yes    Weight blue bands    Reps 10-15    Time 10 Minutes      Oxygen   Oxygen Continuous    Liters 2      NuStep   Level 5    SPM 80    Minutes 15    METs 2      Arm Ergometer   Level 2.5    Watts 41    Minutes 15      Home Exercise Plan   Plans to continue exercise at Home (comment)   and [employer] if he can be registered again   Frequency Add 3 additional days to program exercise sessions.   Additional days added based on how he feels   Initial Home Exercises Provided 09/26/20             Nutrition:  Target Goals: Understanding of nutrition guidelines, daily intake of sodium 1500mg , cholesterol 200mg , calories 30% from fat and 7% or less from saturated fats, daily to have 5 or more servings of fruits and vegetables.  Biometrics:  Pre Biometrics - 08/04/20 1542       Pre Biometrics   Grip Strength 40.5 kg  Nutrition Therapy Plan and Nutrition Goals:  Nutrition Therapy & Goals - 08/11/20 1026       Nutrition Therapy   Diet General Healthful      Personal Nutrition Goals   Nutrition Goal Pt to build a healthy plate including vegetables, fruits, whole grains, and low-fat dairy products in a heart healthy meal plan    Personal Goal #2 Pt to read labels to reduce sodium intake in frozen meals    Personal Goal #3 Pt to choose non starchy vegetables with dinner and fruit with lunch      Intervention Plan   Intervention Prescribe, educate and counsel regarding individualized specific dietary  modifications aiming towards targeted core components such as weight, hypertension, lipid management, diabetes, heart failure and other comorbidities.;Nutrition handout(s) given to patient.    Expected Outcomes Long Term Goal: Adherence to prescribed nutrition plan.;Short Term Goal: Understand basic principles of dietary content, such as calories, fat, sodium, cholesterol and nutrients.             Nutrition Assessments:  MEDIFICTS Score Key: ?70 Need to make dietary changes  40-70 Heart Healthy Diet ? 40 Therapeutic Level Cholesterol Diet  Flowsheet Row PULMONARY REHAB CHRONIC OBSTRUCTIVE PULMONARY DISEASE from 08/08/2020 in [place]  Picture Your Plate Total Score on Admission 62      Picture Your Plate Scores: <86 Unhealthy dietary pattern with much room for improvement. 41-50 Dietary pattern unlikely to meet recommendations for good health and room for improvement. 51-60 More healthful dietary pattern, with some room for improvement.  >60 Healthy dietary pattern, although there may be some specific behaviors that could be improved.    Nutrition Goals Re-Evaluation:  Nutrition Goals Re-Evaluation     [place] Name 08/11/20 1027 08/31/20 0825 10/06/20 1420         Goals   Current Weight 163 lb (73.9 kg) 163 lb 2.3 oz (74 kg) 164 lb 3.9 oz (74.5 kg)     Nutrition Goal Pt to build a healthy plate including vegetables, fruits, whole grains, and low-fat dairy products in a heart healthy meal plan Pt to build a healthy plate including vegetables, fruits, whole grains, and low-fat dairy products in a heart healthy meal plan Pt to build a healthy plate including vegetables, fruits, whole grains, and low-fat dairy products in a heart healthy meal plan           Personal Goal #2 Re-Evaluation   Personal Goal #2 Pt to read labels to reduce sodium intake in frozen meals Pt to read labels to reduce sodium intake in frozen meals Pt to read labels to reduce  sodium intake in frozen meals           Personal Goal #3 Re-Evaluation   Personal Goal #3 Pt to choose non starchy vegetables with dinner and fruit with lunch Pt to choose non starchy vegetables with dinner and fruit with lunch Pt to choose non starchy vegetables with dinner and fruit with lunch              Nutrition Goals Discharge (Final Nutrition Goals Re-Evaluation):  Nutrition Goals Re-Evaluation - 10/06/20 1420       Goals   Current Weight 164 lb 3.9 oz (74.5 kg)    Nutrition Goal Pt to build a healthy plate including vegetables, fruits, whole grains, and low-fat dairy products in a heart healthy meal plan      Personal Goal #2 Re-Evaluation   Personal Goal #2 Pt to read  labels to reduce sodium intake in frozen meals      Personal Goal #3 Re-Evaluation   Personal Goal #3 Pt to choose non starchy vegetables with dinner and fruit with lunch             Psychosocial: Target Goals: Acknowledge presence or absence of significant depression and/or stress, maximize coping skills, provide positive support system. Participant is able to verbalize types and ability to use techniques and skills needed for reducing stress and depression.  Initial Review & Psychosocial Screening:  Initial Psych Review & Screening - 08/04/20 1423       Initial Review   Current issues with None Identified      Family Dynamics   Good Support System? Yes   Roomate and friends   Comments Pt does not display any concerns      Barriers   Psychosocial barriers to participate in program The patient should benefit from training in stress management and relaxation.      Screening Interventions   Interventions Encouraged to exercise    Expected Outcomes Long Term goal: The participant improves quality of Life and PHQ9 Scores as seen by post scores and/or verbalization of changes             Quality of Life Scores:  Scores of 19 and below usually indicate a poorer quality of life in these  areas.  A difference of  2-3 points is a clinically meaningful difference.  A difference of 2-3 points in the total score of the Quality of Life Index has been associated with significant improvement in overall quality of life, self-image, physical symptoms, and general health in studies assessing change in quality of life.  PHQ-9: Recent Review Flowsheet Data     Depression screen [facility] 2/9 08/04/2020 06/01/2020 05/30/2020   Decreased Interest 0 0 0   Down, Depressed, Hopeless 0 0 0   PHQ - 2 Score 0 0 0   Altered sleeping 0 - -   Tired, decreased energy 0 - -   Change in appetite 0 - -   Feeling bad or failure about yourself  0 - -   Trouble concentrating 0 - -   Moving slowly or fidgety/restless 0 - -   Suicidal thoughts 0 - -      Interpretation of Total Score  Total Score Depression Severity:  1-4 = Minimal depression, 5-9 = Mild depression, 10-14 = Moderate depression, 15-19 = Moderately severe depression, 20-27 = Severe depression   Psychosocial Evaluation and Intervention:  Psychosocial Evaluation - 08/04/20 1544       Psychosocial Evaluation & Interventions   Interventions Encouraged to exercise with the program and follow exercise prescription    Comments Pt does not show any signs of depression or any psychosocial concerns. He has a positive outlook on life.    Expected Outcomes For pt to remain free of psychosocial concerns and to maintain mental well being    Continue Psychosocial Services  No Follow up required             Psychosocial Re-Evaluation:  Psychosocial Re-Evaluation     [place] Name 08/07/20 1345 09/04/20 1413 09/04/20 1416 10/03/20 0905       Psychosocial Re-Evaluation   Current issues with None Identified None Identified Current Stress Concerns None Identified    Comments Daeron will begin exercising 08/08/2020.  No change in psychosocial concerns since orientation/ walk test. No psychosocial concerns identified at this time. Cecille Rubin is handling what she  calls "  my new normal".  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  She is making great strides in accepting her new normal. No psychosocial concerns at this time for [facility].  He seems to enjoy the social aspect of exercising in pulmonary rehab.  He is very positive and enjoys the interaction with the group class.    Expected Outcomes For Story to continue to be free of psychosocial concerns while participating in pulmonary rehab. For Gertrude to continue to be free of psychosocial concerns while participating in pulmonary rehab. To continue to handle her stress in healthy ways. --    Interventions Encouraged to attend Pulmonary Rehabilitation for the exercise Encouraged to attend Pulmonary Rehabilitation for the exercise Encouraged to attend Pulmonary Rehabilitation for the exercise;Relaxation education;Stress management education Encouraged to attend Pulmonary Rehabilitation for the exercise    Continue Psychosocial Services  No Follow up required No Follow up required No Follow up required No Follow up required         Initial Review   Source of Stress Concerns -- -- Chronic Illness;Unable to participate in former interests or hobbies;Unable to perform yard/household activities --             Psychosocial Discharge (Final Psychosocial Re-Evaluation):  Psychosocial Re-Evaluation - 10/03/20 0905       Psychosocial Re-Evaluation   Current issues with None Identified    Comments No psychosocial concerns at this time for [facility].  He seems to enjoy the social aspect of exercising in pulmonary rehab.  He is very positive and enjoys the interaction with the group class.    Interventions Encouraged to attend Pulmonary Rehabilitation for the exercise    Continue Psychosocial Services  No Follow up required             Education: Education Goals: Education classes will be provided on a weekly basis, covering  required topics. Participant will state understanding/return demonstration of topics presented.  Learning Barriers/Preferences:  Learning Barriers/Preferences - 08/04/20 1426       Learning Barriers/Preferences   Learning Barriers Sight;Hearing   hearing aides in both ears, and wears glasses   Learning Preferences Skilled Demonstration;Individual Instruction;Verbal Instruction             Education Topics: Risk Factor Reduction:  -Group instruction that is supported by a PowerPoint presentation. Instructor discusses the definition of a risk factor, different risk factors for pulmonary disease, and how the heart and lungs work together.   Flowsheet Row PULMONARY REHAB CHRONIC OBSTRUCTIVE PULMONARY DISEASE from 09/28/2020 in [place]  Date 09/28/20  Educator handout       Nutrition for Pulmonary Patient:  -Group instruction provided by PowerPoint slides, verbal discussion, and written materials to support subject matter. The instructor gives an explanation and review of healthy diet recommendations, which includes a discussion on weight management, recommendations for fruit and vegetable consumption, as well as protein, fluid, caffeine, fiber, sodium, sugar, and alcohol. Tips for eating when patients are short of breath are discussed.   Pursed Lip Breathing:  -Group instruction that is supported by demonstration and informational handouts. Instructor discusses the benefits of pursed lip and diaphragmatic breathing and detailed demonstration on how to preform both.     Oxygen Safety:  -Group instruction provided by PowerPoint, verbal discussion, and written material to support subject matter. There is an overview of "What is Oxygen" and "  Why do we need it".  Instructor also reviews how to create a safe environment for oxygen use, the importance of using oxygen as prescribed, and the risks of noncompliance. There is a brief discussion on traveling with  oxygen and resources the patient may utilize.   Oxygen Equipment:  -Group instruction provided by [facility] Staff utilizing handouts, written materials, and equipment demonstrations.   Signs and Symptoms:  -Group instruction provided by written material and verbal discussion to support subject matter. Warning signs and symptoms of infection, stroke, and heart attack are reviewed and when to call the physician/911 reinforced. Tips for preventing the spread of infection discussed.   Advanced Directives:  -Group instruction provided by verbal instruction and written material to support subject matter. Instructor reviews Advanced Directive laws and proper instruction for filling out document.   Pulmonary Video:  -Group video education that reviews the importance of medication and oxygen compliance, exercise, good nutrition, pulmonary hygiene, and pursed lip and diaphragmatic breathing for the pulmonary patient.   Exercise for the Pulmonary Patient:  -Group instruction that is supported by a PowerPoint presentation. Instructor discusses benefits of exercise, core components of exercise, frequency, duration, and intensity of an exercise routine, importance of utilizing pulse oximetry during exercise, safety while exercising, and options of places to exercise outside of rehab.     Pulmonary Medications:  -Verbally interactive group education provided by instructor with focus on inhaled medications and proper administration. Flowsheet Row PULMONARY REHAB CHRONIC OBSTRUCTIVE PULMONARY DISEASE from 09/28/2020 in [place]  Date 08/24/20  Educator Handout       Anatomy and Physiology of the Respiratory System and Intimacy:  -Group instruction provided by PowerPoint, verbal discussion, and written material to support subject matter. Instructor reviews respiratory cycle and anatomical components of the respiratory system and their functions. Instructor also  reviews differences in obstructive and restrictive respiratory diseases with examples of each. Intimacy, Sex, and Sexuality differences are reviewed with a discussion on how relationships can change when diagnosed with pulmonary disease. Common sexual concerns are reviewed. Flowsheet Row PULMONARY REHAB CHRONIC OBSTRUCTIVE PULMONARY DISEASE from 09/28/2020 in [place]  Date 08/31/20  Educator Handout  [Beat a sedentary lifestyle]       MD DAY -A group question and answer session with a medical doctor that allows participants to ask questions that relate to their pulmonary disease state.   OTHER EDUCATION -Group or individual verbal, written, or video instructions that support the educational goals of the pulmonary rehab program. [place] from 09/28/2020 in [place]  Date 09/21/20  Educator handout  Willodean Rosenthal Reading]       Holiday Eating Survival Tips:  -Group instruction provided by PowerPoint slides, verbal discussion, and written materials to support subject matter. The instructor gives patients tips, tricks, and techniques to help them not only survive but enjoy the holidays despite the onslaught of food that accompanies the holidays.   Knowledge Questionnaire Score:  Knowledge Questionnaire Score - 10/05/20 1512       Knowledge Questionnaire Score   Post Score 16/18             Core Components/Risk Factors/Patient Goals at Admission:  Personal Goals and Risk Factors at Admission - 08/04/20 1546       Core Components/Risk Factors/Patient Goals on Admission    Weight Management Weight Maintenance    Improve shortness of breath with ADL's Yes  Intervention Provide education, individualized exercise plan and daily activity instruction to help decrease symptoms of SOB with activities of daily living.    Expected Outcomes Short Term: Improve  cardiorespiratory fitness to achieve a reduction of symptoms when performing ADLs;Long Term: Be able to perform more ADLs without symptoms or delay the onset of symptoms    Hypertension Yes    Intervention Provide education on lifestyle modifcations including regular physical activity/exercise, weight management, moderate sodium restriction and increased consumption of fresh fruit, vegetables, and low fat dairy, alcohol moderation, and smoking cessation.;Monitor prescription use compliance.    Expected Outcomes Short Term: Continued assessment and intervention until BP is < 140/57mm HG in hypertensive participants. < 130/61mm HG in hypertensive participants with diabetes, heart failure or chronic kidney disease.;Long Term: Maintenance of blood pressure at goal levels.             Core Components/Risk Factors/Patient Goals Review:   Goals and Risk Factor Review     Row Name 08/07/20 1347 09/04/20 1414 09/04/20 1419 10/03/20 0906       Core Components/Risk Factors/Patient Goals Review   Personal Goals Review Develop more efficient breathing techniques such as purse lipped breathing and diaphragmatic breathing and practicing self-pacing with activity.;Increase knowledge of respiratory medications and ability to use respiratory devices properly.;Improve shortness of breath with ADL's Develop more efficient breathing techniques such as purse lipped breathing and diaphragmatic breathing and practicing self-pacing with activity.;Increase knowledge of respiratory medications and ability to use respiratory devices properly.;Improve shortness of breath with ADL's Develop more efficient breathing techniques such as purse lipped breathing and diaphragmatic breathing and practicing self-pacing with activity.;Increase knowledge of respiratory medications and ability to use respiratory devices properly.;Improve shortness of breath with ADL's Develop more efficient breathing techniques such as purse lipped  breathing and diaphragmatic breathing and practicing self-pacing with activity.;Increase knowledge of respiratory medications and ability to use respiratory devices properly.;Improve shortness of breath with ADL's    Review Rhythm will begin exercise in pulmonary rehab 08/08/2020. Loyal is progressing well, he is on level 4 of the nustep and level 1.5 of the arm ergomenter.  He works hard and is increasing his strength and stamina and decreasing his shortness of breath. -- Holt has increased his strength and stamina greatly since exercising in pulmonary rehab.  He is exercising @ 2.3 mets on the nustep and level 2.5 on the arm ergomenter.  He is still requiring 2 L of supplemental oxygen constantly.    Expected Outcomes See admission goals. See admission goals. See admission goals. See admission goals.             Core Components/Risk Factors/Patient Goals at Discharge (Final Review):   Goals and Risk Factor Review - 10/03/20 0906       Core Components/Risk Factors/Patient Goals Review   Personal Goals Review Develop more efficient breathing techniques such as purse lipped breathing and diaphragmatic breathing and practicing self-pacing with activity.;Increase knowledge of respiratory medications and ability to use respiratory devices properly.;Improve shortness of breath with ADL's    Review Eziah has increased his strength and stamina greatly since exercising in pulmonary rehab.  He is exercising @ 2.3 mets on the nustep and level 2.5 on the arm ergomenter.  He is still requiring 2 L of supplemental oxygen constantly.    Expected Outcomes See admission goals.             ITP Comments:   Comments: ITP REVIEW Pt is making expected progress toward pulmonary rehab goals after  completing 17 sessions. Recommend continued exercise, life style modification, education, and utilization of breathing techniques to increase stamina and strength and decrease shortness of breath with exertion.

## 2020-10-10 ENCOUNTER — Other Ambulatory Visit: Payer: Self-pay

## 2020-10-10 ENCOUNTER — Encounter (HOSPITAL_COMMUNITY)
Admission: RE | Admit: 2020-10-10 | Discharge: 2020-10-10 | Disposition: A | Discharge status: Home / Self Care | Payer: Medicare HMO | Source: Ambulatory Visit | Attending: Pulmonary Disease | Admitting: Pulmonary Disease

## 2020-10-10 VITALS — Wt 165.6 lb

## 2020-10-10 DIAGNOSIS — U099 Post covid-19 condition, unspecified: Secondary | ICD-10-CM

## 2020-10-10 DIAGNOSIS — J849 Interstitial pulmonary disease, unspecified: Secondary | ICD-10-CM | POA: Diagnosis not present

## 2020-10-10 NOTE — Progress Notes (Signed)
  Daily Session Note  Patient Details  Name: Johnathan [name] MRN: [id number] Date of Birth: [birth year] Referring Provider:   [name] Pulmonary Rehab Walk Test from 08/04/2020 in [place]  Referring Provider Dr. [name]       Encounter Date: 10/10/2020  Check In:  Session Check In - 10/10/20 1422       Check-In   Supervising physician immediately available to respond to emergencies [facility] immediately available    Physician(s) Dr. Sarajane Jews    Location [facility]    Staff Present Rosebud Poles, RN, Quentin Ore, MS, ACSM-CEP, Exercise Physiologist;Lisa Ysidro Evert, RN    Virtual Visit No    Medication changes reported     No    Fall or balance concerns reported    No    Tobacco Cessation No Change    Warm-up and Cool-down Performed as group-led instruction    Resistance Training Performed Yes    VAD Patient? No    PAD/SET Patient? No      Pain Assessment   Currently in Pain? No/denies    Multiple Pain Sites No             Capillary Blood Glucose: No results found for this or any previous visit (from the past 24 hour(s)).   Exercise Prescription Changes - 10/10/20 1500       Response to Exercise   Blood Pressure (Admit) 138/78    Blood Pressure (Exercise) 140/64    Blood Pressure (Exit) 120/60    Heart Rate (Admit) 75 bpm    Heart Rate (Exercise) 89 bpm    Heart Rate (Exit) 80 bpm    Oxygen Saturation (Admit) 95 %    Oxygen Saturation (Exercise) 96 %    Oxygen Saturation (Exit) 97 %    Rating of Perceived Exertion (Exercise) 11    Perceived Dyspnea (Exercise) 1    Duration Continue with 30 min of aerobic exercise without signs/symptoms of physical distress.    Intensity THRR unchanged      Progression   Progression Continue to progress workloads to maintain intensity without signs/symptoms of physical distress.      Resistance Training   Training Prescription Yes    Weight blue bands    Reps  10-15    Time 10 Minutes      Oxygen   Oxygen Continuous    Liters 2      NuStep   Level 5    SPM 80    Minutes 15    METs 2.1             Social History   Tobacco Use  Smoking Status Former   Packs/day: 1.00   Years: 45.00   Pack years: 45.00   Types: Cigarettes   Quit date: 01/21/1994   Years since quitting: 26.7  Smokeless Tobacco Never    Goals Met:  Proper associated with RPD/PD & O2 Sat Independence with exercise equipment Improved SOB with ADL's Using PLB without cueing & demonstrates good technique Exercise tolerated well No report of concerns or symptoms today Strength training completed today  Goals Unmet:  Not Applicable  Comments: Service time is from 1325 to 1435.    Dr. [name] is Medical Director for Cardiac Rehab at [facility].

## 2020-10-11 ENCOUNTER — Other Ambulatory Visit: Payer: Self-pay | Admitting: Pulmonary Disease

## 2020-10-12 NOTE — Progress Notes (Signed)
Discharge Progress Report  Patient Details  Name: Johnathan [name] MRN: [id number] Date of Birth: 1932/03/31 Referring Provider:   [name] [facility] Walk Test from 08/04/2020 in [place]  Referring Provider Dr. [name]        Number of Visits: 18  Reason for Discharge:  Patient reached a stable level of exercise. Patient independent in their exercise. Patient has met program and personal goals.  Smoking History:  Social History   Tobacco Use  Smoking Status Former   Packs/day: 1.00   Years: 45.00   Pack years: 45.00   Types: Cigarettes   Quit date: 01/21/1994   Years since quitting: 26.7  Smokeless Tobacco Never    Diagnosis:  Post covid-19 condition, unspecified  Interstitial pulmonary disease ([address])  ADL [facility]:  Pulmonary Assessment Scores     Row Name 08/04/20 1600 10/05/20 1512 10/10/20 1508     ADL [facility]   ADL Phase Entry Exit --   SOB Score total 19 60 --     CAT Score   CAT Score 7 15 --     mMRC Score   mMRC Score 3 -- 3            Initial Exercise Prescription:  Initial Exercise Prescription - 08/04/20 1500       Date of Initial Exercise RX and Referring Provider   Date 08/04/20    Referring Provider Dr. [name]    Expected Discharge Date 10/05/20      Oxygen   Oxygen Continuous    Liters 2      NuStep   Level 1    SPM 80    Minutes 15      Arm Ergometer   Level 1    Minutes 15      Prescription Details   Frequency (times per week) 2    Duration Progress to 30 minutes of continuous aerobic without signs/symptoms of physical distress      Intensity   THRR 40-80% of Max Heartrate 54-108    Ratings of Perceived Exertion 11-13    Perceived Dyspnea 0-4      Progression   Progression Continue to progress workloads to maintain intensity without signs/symptoms of physical distress.      Resistance Training   Training Prescription Yes    Weight blue bands    Reps 10-15              Discharge Exercise Prescription (Final Exercise Prescription Changes):  Exercise Prescription Changes - 10/10/20 1500       Response to Exercise   Blood Pressure (Admit) 138/78    Blood Pressure (Exercise) 140/64    Blood Pressure (Exit) 120/60    Heart Rate (Admit) 75 bpm    Heart Rate (Exercise) 89 bpm    Heart Rate (Exit) 80 bpm    Oxygen Saturation (Admit) 95 %    Oxygen Saturation (Exercise) 96 %    Oxygen Saturation (Exit) 97 %    Rating of Perceived Exertion (Exercise) 11    Perceived Dyspnea (Exercise) 1    Duration Continue with 30 min of aerobic exercise without signs/symptoms of physical distress.    Intensity THRR unchanged      Progression   Progression Continue to progress workloads to maintain intensity without signs/symptoms of physical distress.      Resistance Training   Training Prescription Yes    Weight blue bands    Reps 10-15    Time 10 Minutes  Oxygen   Oxygen Continuous    Liters 2      NuStep   Level 5    SPM 80    Minutes 15    METs 2.1             Functional Capacity:  6 Minute Walk     Row Name 08/04/20 1551 10/10/20 1501       6 Minute Walk   Phase Initial Discharge    Distance 1052 feet 1083 feet    Distance % Change -- 3.14 %    Distance Feet Change -- 33 ft    Walk Time 6 minutes 6 minutes    # of Rest Breaks 1  1 standing rest break for 30 seconds due to SOB 0    MPH 1.99 2.05    METS 2.31 2.08    RPE 13 15    Perceived Dyspnea  3 3    VO2 Peak 8.08 7.29    Symptoms No No    Resting HR 71 bpm 75 bpm    Resting BP 144/62 138/68    Resting Oxygen Saturation  96 % 95 %    Exercise Oxygen Saturation  during 6 min walk 88 % 88 %    Max Ex. HR 103 bpm 106 bpm    Max Ex. BP 190/58 150/64    2 Minute Post BP 152/64 148/68         Interval HR   1 Minute HR 90 88    2 Minute HR 97 96    3 Minute HR 97 102    4 Minute HR 90 104    5 Minute HR 101 100    6 Minute HR 103 106    2 Minute Post HR 79 82     Interval Heart Rate? Yes Yes         Interval Oxygen   Interval Oxygen? Yes Yes    Baseline Oxygen Saturation % 96 % 95 %  2L    1 Minute Oxygen Saturation % 98 % 99 %    1 Minute Liters of Oxygen 2 L 2 L    2 Minute Oxygen Saturation % 97 % 95 %    2 Minute Liters of Oxygen 2 L 2 L    3 Minute Oxygen Saturation % 92 % 94 %    3 Minute Liters of Oxygen 2 L 2 L    4 Minute Oxygen Saturation % 88 % 92 %    4 Minute Liters of Oxygen 2 L 2 L    5 Minute Oxygen Saturation % 90 % 90 %    5 Minute Liters of Oxygen 2 L 2 L    6 Minute Oxygen Saturation % 89 % 88 %    6 Minute Liters of Oxygen 2 L 2 L    2 Minute Post Oxygen Saturation % 96 % 97 %    2 Minute Post Liters of Oxygen 2 L 2 L             Psychological, QOL, Others - Outcomes: PHQ 2/9: Depression screen [facility] 2/9 10/10/2020 08/04/2020 06/01/2020 05/30/2020  Decreased Interest 0 0 0 0  Down, Depressed, Hopeless 0 0 0 0  PHQ - 2 Score 0 0 0 0  Altered sleeping 0 0 - -  Tired, decreased energy 0 0 - -  Change in appetite 0 0 - -  Feeling bad or failure about yourself  0 0 - -  Trouble concentrating 0 0 - -  Moving slowly or fidgety/restless 0 0 - -  Suicidal thoughts 0 0 - -  PHQ-9 Score 0 - - -  Difficult doing work/chores Not difficult at all - - -    Quality of Life:   Personal Goals: Goals established at orientation with interventions provided to work toward goal.  Personal Goals and Risk Factors at Admission - 08/04/20 1546       Core Components/Risk Factors/Patient Goals on Admission    Weight Management Weight Maintenance    Improve shortness of breath with ADL's Yes    Intervention Provide education, individualized exercise plan and daily activity instruction to help decrease symptoms of SOB with activities of daily living.    Expected Outcomes Short Term: Improve cardiorespiratory fitness to achieve a reduction of symptoms when performing ADLs;Long Term: Be able to perform more ADLs without symptoms or delay  the onset of symptoms    Hypertension Yes    Intervention Provide education on lifestyle modifcations including regular physical activity/exercise, weight management, moderate sodium restriction and increased consumption of fresh fruit, vegetables, and low fat dairy, alcohol moderation, and smoking cessation.;Monitor prescription use compliance.    Expected Outcomes Short Term: Continued assessment and intervention until BP is < 140/57m HG in hypertensive participants. < 130/818mHG in hypertensive participants with diabetes, heart failure or chronic kidney disease.;Long Term: Maintenance of blood pressure at goal levels.              Personal Goals Discharge:  Goals and Risk Factor Review     Row Name 08/07/20 1347 09/04/20 1414 09/04/20 1419 10/03/20 0906       Core Components/Risk Factors/Patient Goals Review   Personal Goals Review Develop more efficient breathing techniques such as purse lipped breathing and diaphragmatic breathing and practicing self-pacing with activity.;Increase knowledge of respiratory medications and ability to use respiratory devices properly.;Improve shortness of breath with ADL's Develop more efficient breathing techniques such as purse lipped breathing and diaphragmatic breathing and practicing self-pacing with activity.;Increase knowledge of respiratory medications and ability to use respiratory devices properly.;Improve shortness of breath with ADL's Develop more efficient breathing techniques such as purse lipped breathing and diaphragmatic breathing and practicing self-pacing with activity.;Increase knowledge of respiratory medications and ability to use respiratory devices properly.;Improve shortness of breath with ADL's Develop more efficient breathing techniques such as purse lipped breathing and diaphragmatic breathing and practicing self-pacing with activity.;Increase knowledge of respiratory medications and ability to use respiratory devices  properly.;Improve shortness of breath with ADL's    Review CaJaquezill begin exercise in pulmonary rehab 08/08/2020. CaKylers progressing well, he is on level 4 of the nustep and level 1.5 of the arm ergomenter.  He works hard and is increasing his strength and stamina and decreasing his shortness of breath. -- CaLucilleas increased his strength and stamina greatly since exercising in pulmonary rehab.  He is exercising @ 2.3 mets on the nustep and level 2.5 on the arm ergomenter.  He is still requiring 2 L of supplemental oxygen constantly.    Expected Outcomes See admission goals. See admission goals. See admission goals. See admission goals.             Exercise Goals and Review:  Exercise Goals     Row Name 08/04/20 1542 10/03/20 0816           Exercise Goals   Increase Physical Activity Yes Yes      Intervention Provide advice, education, support and counseling  about physical activity/exercise needs.;Develop an individualized exercise prescription for aerobic and resistive training based on initial evaluation findings, risk stratification, comorbidities and participant's personal goals. Provide advice, education, support and counseling about physical activity/exercise needs.;Develop an individualized exercise prescription for aerobic and resistive training based on initial evaluation findings, risk stratification, comorbidities and participant's personal goals.      Expected Outcomes Short Term: Attend rehab on a regular basis to increase amount of physical activity.;Long Term: Add in home exercise to make exercise part of routine and to increase amount of physical activity.;Long Term: Exercising regularly at least 3-5 days a week. Short Term: Attend rehab on a regular basis to increase amount of physical activity.;Long Term: Add in home exercise to make exercise part of routine and to increase amount of physical activity.;Long Term: Exercising regularly at least 3-5 days a week.      Increase  Strength and Stamina Yes Yes      Intervention Provide advice, education, support and counseling about physical activity/exercise needs.;Develop an individualized exercise prescription for aerobic and resistive training based on initial evaluation findings, risk stratification, comorbidities and participant's personal goals. Provide advice, education, support and counseling about physical activity/exercise needs.;Develop an individualized exercise prescription for aerobic and resistive training based on initial evaluation findings, risk stratification, comorbidities and participant's personal goals.      Expected Outcomes Short Term: Increase workloads from initial exercise prescription for resistance, speed, and METs.;Short Term: Perform resistance training exercises routinely during rehab and add in resistance training at home;Long Term: Improve cardiorespiratory fitness, muscular endurance and strength as measured by increased METs and functional capacity (6MWT) Short Term: Increase workloads from initial exercise prescription for resistance, speed, and METs.;Short Term: Perform resistance training exercises routinely during rehab and add in resistance training at home;Long Term: Improve cardiorespiratory fitness, muscular endurance and strength as measured by increased METs and functional capacity (6MWT)      Able to understand and use rate of perceived exertion (RPE) scale Yes Yes      Intervention Provide education and explanation on how to use RPE scale Provide education and explanation on how to use RPE scale      Expected Outcomes Short Term: Able to use RPE daily in rehab to express subjective intensity level;Long Term:  Able to use RPE to guide intensity level when exercising independently Short Term: Able to use RPE daily in rehab to express subjective intensity level;Long Term:  Able to use RPE to guide intensity level when exercising independently      Able to understand and use Dyspnea scale Yes  Yes      Intervention Provide education and explanation on how to use Dyspnea scale Provide education and explanation on how to use Dyspnea scale      Expected Outcomes Short Term: Able to use Dyspnea scale daily in rehab to express subjective sense of shortness of breath during exertion;Long Term: Able to use Dyspnea scale to guide intensity level when exercising independently Short Term: Able to use Dyspnea scale daily in rehab to express subjective sense of shortness of breath during exertion;Long Term: Able to use Dyspnea scale to guide intensity level when exercising independently      Knowledge and understanding of Target Heart Rate Range (THRR) Yes Yes      Intervention Provide education and explanation of THRR including how the numbers were predicted and where they are located for reference Provide education and explanation of THRR including how the numbers were predicted and where they are located for  reference      Expected Outcomes Short Term: Able to state/look up THRR;Long Term: Able to use THRR to govern intensity when exercising independently;Short Term: Able to use daily as guideline for intensity in rehab Short Term: Able to state/look up THRR;Long Term: Able to use THRR to govern intensity when exercising independently;Short Term: Able to use daily as guideline for intensity in rehab      Understanding of Exercise Prescription Yes Yes      Intervention Provide education, explanation, and written materials on patient's individual exercise prescription Provide education, explanation, and written materials on patient's individual exercise prescription      Expected Outcomes Short Term: Able to explain program exercise prescription;Long Term: Able to explain home exercise prescription to exercise independently Short Term: Able to explain program exercise prescription;Long Term: Able to explain home exercise prescription to exercise independently               Exercise Goals  Re-Evaluation:  Exercise Goals Re-Evaluation     Row Name 08/07/20 1624 10/03/20 0817           Exercise Goal Re-Evaluation   Exercise Goals Review Increase Physical Activity;Increase Strength and Stamina;Able to understand and use rate of perceived exertion (RPE) scale;Able to understand and use Dyspnea scale;Knowledge and understanding of Target Heart Rate Range (THRR);Understanding of Exercise Prescription Increase Physical Activity;Increase Strength and Stamina;Able to understand and use rate of perceived exertion (RPE) scale;Able to understand and use Dyspnea scale;Knowledge and understanding of Target Heart Rate Range (THRR);Understanding of Exercise Prescription      Comments Patient is scheduled to start exercise this week. Will monitor and progress as he is able. Kegan has completed 15 exercise sessions. He tolerates exercise well on the Nustep and arm ergometer. Raydan has averages 2.3 METs at leve 5 on the Nustep and 42 rpm on the arm ergometer. He performs the warmup and cooldown standing up without limitations. He does exercise at home currently but is motivated to once he is able to attend the [facility]. Will continue to monitor and progress as able.      Expected Outcomes Through exercise at rehab and home, the patient will decrease shortness of breath with daily activities and feel confident in carrying out an exercise regimn at home. Through exercise at rehab and home, the patient will decrease shortness of breath with daily activities and feel confident in carrying out an exercise regimn at home.               Nutrition & Weight - Outcomes:  Pre Biometrics - 08/04/20 1542       Pre Biometrics   Grip Strength 40.5 kg             Post Biometrics - 10/10/20 1500        Post  Biometrics   Grip Strength 35 kg             Nutrition:  Nutrition Therapy & Goals - 08/11/20 1026       Nutrition Therapy   Diet General Healthful      Personal Nutrition Goals    Nutrition Goal Pt to build a healthy plate including vegetables, fruits, whole grains, and low-fat dairy products in a heart healthy meal plan    Personal Goal #2 Pt to read labels to reduce sodium intake in frozen meals    Personal Goal #3 Pt to choose non starchy vegetables with dinner and fruit with lunch      Intervention Plan  Intervention Prescribe, educate and counsel regarding individualized specific dietary modifications aiming towards targeted core components such as weight, hypertension, lipid management, diabetes, heart failure and other comorbidities.;Nutrition handout(s) given to patient.    Expected Outcomes Long Term Goal: Adherence to prescribed nutrition plan.;Short Term Goal: Understand basic principles of dietary content, such as calories, fat, sodium, cholesterol and nutrients.             Nutrition Discharge:   Education Questionnaire Score:  Knowledge Questionnaire Score - 10/05/20 1512       Knowledge Questionnaire Score   Post Score 16/18             Goals reviewed with patient; copy given to patient.

## 2020-10-19 NOTE — Progress Notes (Signed)
[facility] PREP Evaluation  Patient Details  Name: Johnathan [name] MRN: [id number] Date of Birth: Apr 23, 1932 Age: 87 y.o. PCP: Darreld Mclean, MD  Vitals:   10/19/20 0136  BP: 132/70  Pulse: 75  SpO2: 95%  Weight: 161 lb 9.6 oz (73.3 kg)     [facility] Eval - 10/19/20 1700       [facility] "PREP" Location   [facility] "PREP" Location [facility]      Referral    Referring Provider self /PCP    Reason for referral Other   COPD s/p covid pneumonia   Program Start Date 10/23/20   M/W 1p-215pm x 12 wks     Measurement   Waist Circumference 41 inches    Hip Circumference 40 inches    Body fat --   unable to calculate     Information for Trainer   Goals Get off O2, lose 10-15 lbs, build strength    Current Exercise just finished pulm rehab    Orthopedic Concerns LBP, BIl shoulder pain    Pertinent Medical History COPD, hx of covid pneumonia, prediabete    Medications that affect exercise Oxgen;Medication causing dizziness/drowsiness      Mobility and Daily Activities   I find it easy to walk up or down two or more flights of stairs. 1    I have no trouble taking out the trash. 2    I do housework such as vacuuming and dusting on my own without difficulty. 4    I can easily lift a gallon of milk (8lbs). 4    I can easily walk a mile. 1    I have no trouble reaching into high cupboards or reaching down to pick up something from the floor. 2    I do not have trouble doing out-door work such as Armed forces logistics/support/administrative officer, raking leaves, or gardening. 3      Mobility and Daily Activities   I feel younger than my age. 4    I feel independent. 4    I feel energetic. 3    I live an active life.  3    I feel strong. 1    I feel healthy. 2    I feel active as other people my age. 4      How fit and strong are you.   Fit and Strong Total Score 38            Past Medical History:  Diagnosis Date   COPD (chronic obstructive pulmonary disease) ([place])    Diverticulosis    DM type 2 (diabetes  mellitus, type 2) ([facility])    GERD (gastroesophageal reflux disease)    uses prilosec    Hypertension    Pneumonia 2018   Raynaud's disease    Shortness of breath dyspnea    WITH EXERTION   Sinusitis    Tremors of nervous system    Past Surgical History:  Procedure Laterality Date   COLONOSCOPY  03/27/2005   HERNIA REPAIR     INGUINAL HERNIA REPAIR N/A 05/06/2019   Procedure: LAPAROSCOPIC RIGHT AND LEFT INGUINAL HERNIA(S) REPAIR;  Surgeon: Michael Boston, MD;  Location: [place];  Service: General;  Laterality: N/A;   Social History   Tobacco Use  Smoking Status Former   Packs/day: 1.00   Years: 45.00   Pack years: 45.00   Types: Cigarettes   Quit date: 01/21/1994   Years since quitting: 26.7  Smokeless Tobacco Never    Pam  Tally Joe 10/19/2020, 5:22 PM

## 2020-10-21 DIAGNOSIS — U071 COVID-19: Secondary | ICD-10-CM | POA: Diagnosis not present

## 2020-11-08 ENCOUNTER — Other Ambulatory Visit: Payer: Self-pay | Admitting: Family Medicine

## 2020-11-08 NOTE — Progress Notes (Signed)
[facility] PREP Weekly Session  Patient Details  Name: Johnathan [name] MRN: [id number] Date of Birth: [birth year] Age: 87 y.o. PCP: Darreld Mclean, MD  Vitals:   11/06/20 1300  Weight: 160 lb (72.6 kg)     [facility] Weekly seesion - 11/08/20 0900       [facility] "PREP" Location   [facility] "PREP" Location [facility]      Weekly Session   Topic Discussed Healthy eating tips    Minutes exercised this week 60 minutes    Classes attended to date 5           Class held on 11/06/20  [name] 11/08/2020, 9:46 AM

## 2020-11-14 NOTE — Progress Notes (Signed)
[facility] PREP Weekly Session  Patient Details  Name: Johnathan [name] MRN: [id number] Date of Birth: Jul 22, 1932 Age: 87 y.o. PCP: Darreld Mclean, MD  Vitals:   11/13/20 1330  Weight: 161 lb 6.4 oz (73.2 kg)     [facility] Weekly seesion - 11/14/20 1600       [facility] "PREP" Location   [facility] "PREP" Location [facility]      Weekly Session   Topic Discussed Health habits    Minutes exercised this week 70 minutes    Classes attended to date 7            Class held on 11/13/20   [name] 11/14/2020, 4:39 PM

## 2020-11-15 ENCOUNTER — Telehealth: Payer: Self-pay | Admitting: Family Medicine

## 2020-11-15 ENCOUNTER — Other Ambulatory Visit: Payer: Self-pay

## 2020-11-15 MED ORDER — AMLODIPINE BESYLATE 10 MG PO TABS: 10.0000 mg | ORAL_TABLET | Freq: Every day | ORAL | 0 refills | Status: DC

## 2020-11-15 MED ORDER — KETOCONAZOLE 2 % EX SHAM: MEDICATED_SHAMPOO | CUTANEOUS | 0 refills | Status: DC

## 2020-11-15 NOTE — Telephone Encounter (Signed)
Pharmacy called stating Johnathan [name] was requesting a refill for ketoconazole (NIZORAL) 2 % shampoo and amLODipine (NORVASC) 10 MG tablet  they also stated it was a 5mg  in their system. Please advise.     [place]  [place], [place] [address]  Phone:  [phone]  Fax:  [phone]

## 2020-11-15 NOTE — Telephone Encounter (Signed)
Rx refilled.

## 2020-11-21 ENCOUNTER — Other Ambulatory Visit: Payer: Self-pay

## 2020-11-21 DIAGNOSIS — U071 COVID-19: Secondary | ICD-10-CM | POA: Diagnosis not present

## 2020-11-21 NOTE — Patient Outreach (Signed)
[facility]) Care Management  11/21/2020  Johnathan [name] [birth year] [id number]   Telephone Assessment   Voicemail message received from patient returning RN CM call. Return call placed to patient. Spoke with patient who reported he was currently driving at present and unable to talk.      Plan: RN CM will make outreach attempt to patient as previously scheduled.   Enzo Montgomery, RN,BSN,CCM [place] Management Telephonic Care Management Coordinator Direct Phone: [phone] Toll Free: [phone] Fax: [phone]

## 2020-11-21 NOTE — Patient Instructions (Addendum)
It was good to see you again today We will set you up to see GI here at the [facility] to discuss your swallowing problem   Please see me in about 4 months and take care

## 2020-11-21 NOTE — Progress Notes (Signed)
[facility] PREP Weekly Session  Patient Details  Name: Johnathan [name] MRN: [id number] Date of Birth: [birth year] Age: 87 y.o. PCP: Darreld Mclean, MD  Vitals:   11/20/20 1300  Weight: 150 lb (68 kg)     [facility] Weekly seesion - 11/21/20 1700       [facility] "PREP" Location   [facility] "PREP" [facility]      Weekly Session   Topic Discussed Restaurant Eating   Salt demo   Minutes exercised this week 60 minutes    Classes attended to date 9           Class held on 11/20/20  [name] 11/21/2020, 5:18 PM

## 2020-11-21 NOTE — Patient Outreach (Signed)
[facility]) Care Management  11/21/2020  Johnathan [name] 06-Oct-1932 [id number]   Telephone Assessment    Outreach attempt #1 to patient. No answer. RN CM left HIPAA compliant voicemail message along with contact info.    Plan: RN CM will make outreach attempt to patient within the moth of Jan if no return call.  Enzo Montgomery, RN,BSN,CCM [place] Management Telephonic Care Management Coordinator Direct Phone: [phone] Toll Free: [phone] Fax: [phone]

## 2020-11-21 NOTE — Progress Notes (Signed)
[place] [facility] [address], [place], [place] [address] [phone]  Date:  11/23/2020   Name:  Johnathan [name]   DOB:  1932-11-04   MRN:  [id number]  PCP:  Darreld Mclean, MD    Chief Complaint: Follow-up   History of Present Illness:  Johnathan [name] is a 87 y.o. very pleasant male patient who presents with the following:  Patient seen today with concern about a finger issue History of emphysema, diabetes, COPD, hypertension, former smoker Most recent visit with myself in July  He was doing very well for his age until he got COVID-74 in February of this year.  Since that time he has struggled with nonspecific interstitial lung disease and is using oxygen  He was doing pulmonary rehab through September-at that time he completed the program and was discharged from care He plans to work out at [employer] a couple of times a week   He has noted a trigger finger in his right hand, ring finger.  However, it is not really bothersome to him and he does not want a particular treatment plan  Using oxygen 2L right now- he has a portable concentrator  He takes his oxygen off sometimes and feels like he does ok, his sats may go to the 90s   He is having a hard time cutting his amlodipine 10 mg in half- he would like to cut down to 5 mg which is what he is taking   Lab Results  Component Value Date   HGBA1C 6.7 (H) 07/31/2020    Patient Active Problem List   Diagnosis Date Noted   Pulmonary emphysema ([place]) 04/26/2020   COVID-19 02/25/2020   Shingles 11/14/2019   Raynaud's syndrome 11/14/2019   Skin cancer 11/14/2019   Left inguinal hernia s/p lap repair w mesh 05/06/2019 05/06/2019   Recurrent right inguinal hernia s/p lap repair w mesh 05/06/2019 03/01/2019   Diabetes mellitus ([place]) 03/01/2019   Pneumonia 04/21/2014   COPD with acute exacerbation ([place]) 04/21/2014    Past Medical History:  Diagnosis Date   COPD (chronic  obstructive pulmonary disease) ([place])    Diverticulosis    DM type 2 (diabetes mellitus, type 2) ([place])    GERD (gastroesophageal reflux disease)    uses prilosec    Hypertension    Pneumonia 2018   Raynaud's disease    Shortness of breath dyspnea    WITH EXERTION   Sinusitis    Tremors of nervous system     Past Surgical History:  Procedure Laterality Date   COLONOSCOPY  03/27/2005   HERNIA REPAIR     INGUINAL HERNIA REPAIR N/A 05/06/2019   Procedure: LAPAROSCOPIC RIGHT AND LEFT INGUINAL HERNIA(S) REPAIR;  Surgeon: Michael Boston, MD;  Location: [place];  Service: General;  Laterality: N/A;    Social History   Tobacco Use   Smoking status: Former    Packs/day: 1.00    Years: 45.00    Pack years: 45.00    Types: Cigarettes    Quit date: 01/21/1994    Years since quitting: 26.8   Smokeless tobacco: Never  Substance Use Topics   Alcohol use: Yes    Comment: a drink daily with dinner   Drug use: No    Family History  Problem Relation Age of Onset   Dementia Brother    Diabetes Brother    Heart attack Father    Dementia Father  No Known Allergies  Medication list has been reviewed and updated.  Current Outpatient Medications on File Prior to Visit  Medication Sig Dispense Refill   aspirin EC 81 MG tablet Take 81 mg by mouth daily.     Cyanocobalamin (B-12) 1000 MCG CAPS Take 1 capsule by mouth daily.     Flaxseed, Linseed, (FLAXSEED OIL) 1200 MG CAPS Take 1 capsule by mouth daily.     Fluticasone-Umeclidin-Vilant (TRELEGY ELLIPTA) 100-62.5-25 MCG/INH AEPB INHALE 1 PUFF INTO THE LUNGS DAILY. 180 each 3   furosemide (LASIX) 20 MG tablet Take 1 tablet (20 mg total) by mouth daily as needed for edema. 30 tablet 0   Ginkgo Biloba EXTR Take 1 tablet by mouth 2 (two) times daily.     ketoconazole (NIZORAL) 2 % shampoo APPLY TO AFFECTED AREA(S) TOPICALLY TWO TIMES PER WEEK 120 mL 0   loratadine (CLARITIN) 10 MG tablet TAKE 1 TABLET EVERY DAY 90 tablet 1    meloxicam (MOBIC) 15 MG tablet TAKE 1 TABLET EVERY DAY AS NEEDED FOR JOINT PAIN 90 tablet 0   metFORMIN (GLUCOPHAGE) 500 MG tablet Take 0.5 tablets (250 mg total) by mouth daily. 90 tablet 1   Misc Natural Products (HIMALAYAN GOJI PO) Take 1 tablet by mouth in the morning and at bedtime.      Multiple Vitamin (MULTIVITAMIN WITH MINERALS) TABS tablet Take 1 tablet by mouth daily.     omeprazole (PRILOSEC) 20 MG capsule TAKE 1 CAPSULE TWICE DAILY 180 capsule 1   pravastatin (PRAVACHOL) 40 MG tablet TAKE 1/2 TABLET EVERY DAY 45 tablet 1   primidone (MYSOLINE) 50 MG tablet TAKE 1 TABLET TWICE DAILY 180 tablet 1   sildenafil (VIAGRA) 50 MG tablet Take 1 tablet (50 mg total) by mouth daily as needed for erectile dysfunction. 10 tablet 3   terazosin (HYTRIN) 1 MG capsule TAKE 1 CAPSULE TWICE DAILY 180 capsule 1   Tetrahydrozoline HCl (VISINE OP) Apply 1 drop to eye daily.     triamcinolone cream (KENALOG) 0.1 % Apply 1 application topically 2 (two) times daily. 80 g 0   No current facility-administered medications on file prior to visit.    Review of Systems:  As per HPI- otherwise negative.   Physical Examination: Vitals:   11/23/20 1358  BP: (!) 149/94  Pulse: 96  Resp: 12  Temp: 98 F (36.7 C)  SpO2: 96%   Vitals:   11/23/20 1358  Weight: 152 lb (68.9 kg)  Height: 5\' 10"  (1.778 m)   Body mass index is 21.81 kg/m. Ideal Body Weight: Weight in (lb) to have BMI = 25: 173.9  GEN: no acute distress.  Normal weight, looks well HEENT: Atraumatic, Normocephalic.  Ears and Nose: No external deformity. CV: RRR, No M/G/R. No JVD. No thrill. No extra heart sounds. PULM: CTA B, no wheezes, crackles, rhonchi. No retractions. No resp. distress. No accessory muscle use. ABD: S, NT, ND, +BS. No rebound. No HSM. EXTR: No c/c/e PSYCH: Normally interactive. Conversant.  Wearing oxygen with nasal cannula  BP Readings from Last 3 Encounters:  11/23/20 (!) 149/94  10/19/20 132/70   08/04/20 (!) 144/62    Assessment and Plan: Essential hypertension - Plan: amLODipine (NORVASC) 5 MG tablet  Esophageal dysphagia - Plan: Ambulatory referral to Gastroenterology  Patient seen today for follow-up.  He reports someone called him and schedule this appointment, he is not really sure why he was supposed to come in today.  However, there are couple of small things we can  take care of  Immunizations are up-to-date, he has had his COVID-vaccine booster and flu shot  He notes occasional difficulty swallowing foods-may get stuck.  Referral to GI for evaluation of possible esophageal problem Will decrease his amlodipine to 5 mg which is what he is actually taking  Plan for routine visit in February Signed Lamar Blinks, MD

## 2020-11-23 ENCOUNTER — Other Ambulatory Visit: Payer: Self-pay | Admitting: *Deleted

## 2020-11-23 ENCOUNTER — Ambulatory Visit (INDEPENDENT_AMBULATORY_CARE_PROVIDER_SITE_OTHER): Payer: Medicare HMO | Admitting: Family Medicine

## 2020-11-23 ENCOUNTER — Other Ambulatory Visit: Payer: Self-pay

## 2020-11-23 ENCOUNTER — Encounter: Payer: Self-pay | Admitting: Family Medicine

## 2020-11-23 VITALS — BP 149/94 | HR 96 | Temp 98.0°F | Resp 12 | Ht 70.0 in | Wt 152.0 lb

## 2020-11-23 DIAGNOSIS — I10 Essential (primary) hypertension: Secondary | ICD-10-CM

## 2020-11-23 DIAGNOSIS — R1319 Other dysphagia: Secondary | ICD-10-CM | POA: Diagnosis not present

## 2020-11-23 MED ORDER — AMLODIPINE BESYLATE 5 MG PO TABS: 5.0000 mg | ORAL_TABLET | Freq: Every day | ORAL | 3 refills | Status: DC

## 2020-11-29 ENCOUNTER — Ambulatory Visit: Payer: Medicare HMO | Admitting: Family Medicine

## 2020-11-30 ENCOUNTER — Encounter: Payer: Self-pay | Admitting: Nurse Practitioner

## 2020-12-05 NOTE — Progress Notes (Signed)
[facility] PREP Weekly Session  Patient Details  Name: KOKI BUXTON MRN: [id number] Date of Birth: 1932-07-23 Age: 87 y.o. PCP: Darreld Mclean, MD  Vitals:   12/04/20 1300  Weight: 152 lb 8 oz (69.2 kg)     [facility] Weekly seesion - 12/05/20 1700       [facility] "PREP" Location   [facility] "PREP" Location [facility]      Weekly Session   Topic Discussed Other   non scale victories   Minutes exercised this week 125 minutes    Classes attended to date 13           Class on 12/04/20  [name] 12/05/2020, 5:12 PM

## 2020-12-12 NOTE — Progress Notes (Signed)
[facility] PREP Weekly Session  Patient Details  Name: Johnathan [name] MRN: [id number] Date of Birth: [birth year] Age: 87 y.o. PCP: Darreld Mclean, MD  Vitals:   12/11/20 1330  Weight: 153 lb 8 oz (69.6 kg)     [facility] Weekly seesion - 12/12/20 1600       [facility] "PREP" Location   [facility] "PREP" Location [facility]      Weekly Session   Topic Discussed Other   Portions   Minutes exercised this week 135 minutes    Classes attended to date [place] 12/12/2020, 4:16 PM

## 2020-12-18 ENCOUNTER — Ambulatory Visit (INDEPENDENT_AMBULATORY_CARE_PROVIDER_SITE_OTHER): Payer: Medicare HMO | Admitting: Nurse Practitioner

## 2020-12-18 ENCOUNTER — Encounter: Payer: Self-pay | Admitting: Nurse Practitioner

## 2020-12-18 VITALS — BP 128/74 | HR 98 | Ht 68.0 in | Wt 153.0 lb

## 2020-12-18 DIAGNOSIS — R1310 Dysphagia, unspecified: Secondary | ICD-10-CM

## 2020-12-18 DIAGNOSIS — R0989 Other specified symptoms and signs involving the circulatory and respiratory systems: Secondary | ICD-10-CM

## 2020-12-18 NOTE — Patient Instructions (Addendum)
If you are age 85 or older, your body mass index should be between 23-30. Your Body mass index is 23.26 kg/m. If this is out of the aforementioned range listed, please consider follow up with your Primary Care Provider.  The [facility] providers would like to encourage you to use [facility] to communicate with providers for non-urgent requests or questions.  Due to long hold times on the telephone, sending your provider a message by [facility] may be faster and more efficient way to get a response. Please allow 48 business hours for a response.  Please remember that this is for non-urgent requests/questions.   IMAGING: You will be contacted by [facility] (Your caller ID will indicate phone # [phone]) in the next 7 days to schedule your Swallow Test. If you have not heard from them within 7 business days, please call [facility] at [phone] to follow up on the status of your appointment.    It was great seeing you today! Thank you for entrusting me with your care and choosing [facility].  Noralyn Pick, CRNP

## 2020-12-18 NOTE — Progress Notes (Signed)
12/18/2020 Johnathan [name] [id number] 1932/10/31   CHIEF COMPLAINT: Dysphagia   HISTORY OF PRESENT ILLNESS: Johnathan [name]. Johnathan [name] is an 87 year old male with a past medical history of hypertension, Raynaud's disease, emphysema, COPD, Covid 46 infection 02/2020 with associated nonspecific interstitial lung disease on oxygen 2L [place], DM II and GERD. He presents to our office today as referred by Dr. Janett Billow Copland for further evaluation regarding dysphagia.  He describes having tablets, liquid or food that sometimes goes down the wrong way which occurs intermittently over the past year.  He coughs and the stuck liquid or food passes down the esophagus.  He does not gag or cough out any liquid or food.  He also complains of feeling a lump in the center of his throat which occurs intermittently as well.  No specific food triggers.  No heartburn.  No upper or lower abdominal pain.  He ate his Thanksgiving meal without any difficulty.  He can go 1 month without having any difficulty swallowing.  No antibiotics over the past 6 months.  He was prescribed Prednisone for COVID-pneumonia 02/2020.  He takes Omeprazole 20 mg p.o. twice daily for the past 5 years for GERD symptoms.  He takes Mobic 15 mg 1 tab daily for arthritis for the past few years.  He denies ever having an EGD.  He underwent 2 colonoscopies in his lifetime by [facility].  His last colonoscopy was possibly in 2007, no polyps reported by the patient.  No fevers.  No weight loss.  He gained 10 pounds after he took Prednisone for COVID 19 pneumonia.  No other complaints at this time.      CBC Latest Ref Rng & Units 05/30/2020 04/26/2020 03/30/2020  WBC 4.0 - 10.5 K/uL 5.0 4.7 6.5  Hemoglobin 13.0 - 17.0 g/dL 13.1 12.8(L) 12.7(L)  Hematocrit 39.0 - 52.0 % 38.4(L) 37.3(L) 37.7(L)  Platelets 150.0 - 400.0 K/uL 186.0 183.0 212.0  MCV 94.2.   CMP Latest Ref Rng & Units 07/31/2020 05/30/2020 03/30/2020  Glucose 70 - 99 mg/dL 103(H) 109(H) 142(H)  BUN 6 - 23 mg/dL  28(H) 20 16  Creatinine 0.40 - 1.50 mg/dL 1.01 1.17 1.01  Sodium 135 - 145 mEq/L 136 138 136  Potassium 3.5 - 5.1 mEq/L 4.8 4.6 4.1  Chloride 96 - 112 mEq/L 101 101 98  CO2 19 - 32 mEq/L 30 31 30   Calcium 8.4 - 10.5 mg/dL 9.4 9.7 9.0  Total Protein 6.0 - 8.3 g/dL - 7.7 7.1  Total Bilirubin 0.2 - 1.2 mg/dL - 0.4 0.4  Alkaline Phos 39 - 117 U/L - 81 71  AST 0 - 37 U/L - 16 16  ALT 0 - 53 U/L - 12 16    ECHO 02/04/2019: 1. Left ventricular ejection fraction, by visual estimation, is 60 to 65%. The left ventricle has normal function. There is no left ventricular hypertrophy. 2. Left ventricular diastolic parameters are indeterminate. 3. The left ventricle has no regional wall motion abnormalities. 4. Global right ventricle has normal systolic function.The right ventricular size is normal. No increase in right ventricular wall thickness. 5. Left atrial size was normal. 6. Right atrial size was normal. 7. Moderate to severe mitral annular calcification. 8. The mitral valve is normal in structure. Trivial mitral valve regurgitation. 9. The tricuspid valve is normal in structure. 10. Aortic valve regurgitation is mild to moderate. 11. The aortic valve is tricuspid. Aortic valve regurgitation is mild to moderate. 12. The pulmonic valve was  grossly normal. Pulmonic valve regurgitation is not visualized. 13. The inferior vena cava is normal in size with greater than 50% respiratory variability, suggesting right atrial pressure of 3 mmHg.  Past Medical History:  Diagnosis Date   Anal fissure    COPD (chronic obstructive pulmonary disease) ([place])    Diverticulosis    DM type 2 (diabetes mellitus, type 2) ([facility])    GERD (gastroesophageal reflux disease)    uses prilosec    Hypertension    Pneumonia 2018   Raynaud's disease    Shortness of breath dyspnea    WITH EXERTION   Sinusitis    Tremors of nervous system    Past Surgical History:  Procedure Laterality Date   COLONOSCOPY   03/27/2005   HERNIA REPAIR     INGUINAL HERNIA REPAIR N/A 05/06/2019   Procedure: LAPAROSCOPIC RIGHT AND LEFT INGUINAL HERNIA(S) REPAIR;  Surgeon: Michael Boston, MD;  Location: [place];  Service: General;  Laterality: N/A;   Social History: He quit smoking 25 years ago.  He drinks 1 ounce of rum or bourbon daily.  No drug use.  Family History: Brother with diabetes and dementia.  Father with heart disease and dementia.  No Known Allergies   Outpatient Encounter Medications as of 12/18/2020  Medication Sig   amLODipine (NORVASC) 5 MG tablet Take 1 tablet (5 mg total) by mouth daily.   aspirin EC 81 MG tablet Take 81 mg by mouth daily.   Cyanocobalamin (B-12) 1000 MCG CAPS Take 1 capsule by mouth daily.   Flaxseed, Linseed, (FLAXSEED OIL) 1200 MG CAPS Take 1 capsule by mouth daily.   Fluticasone-Umeclidin-Vilant (TRELEGY ELLIPTA) 100-62.5-25 MCG/INH AEPB INHALE 1 PUFF INTO THE LUNGS DAILY.   furosemide (LASIX) 20 MG tablet Take 1 tablet (20 mg total) by mouth daily as needed for edema.   Ginkgo Biloba EXTR Take 1 tablet by mouth 2 (two) times daily.   ketoconazole (NIZORAL) 2 % shampoo APPLY TO AFFECTED AREA(S) TOPICALLY TWO TIMES PER WEEK   loratadine (CLARITIN) 10 MG tablet TAKE 1 TABLET EVERY DAY   meloxicam (MOBIC) 15 MG tablet TAKE 1 TABLET EVERY DAY AS NEEDED FOR JOINT PAIN   metFORMIN (GLUCOPHAGE) 500 MG tablet Take 0.5 tablets (250 mg total) by mouth daily.   Misc Natural Products (HIMALAYAN GOJI PO) Take 1 tablet by mouth in the morning and at bedtime.    Multiple Vitamin (MULTIVITAMIN WITH MINERALS) TABS tablet Take 1 tablet by mouth daily.   omeprazole (PRILOSEC) 20 MG capsule TAKE 1 CAPSULE TWICE DAILY   pravastatin (PRAVACHOL) 40 MG tablet TAKE 1/2 TABLET EVERY DAY   primidone (MYSOLINE) 50 MG tablet TAKE 1 TABLET TWICE DAILY   sildenafil (VIAGRA) 50 MG tablet Take 1 tablet (50 mg total) by mouth daily as needed for erectile dysfunction.   terazosin (HYTRIN) 1  MG capsule TAKE 1 CAPSULE TWICE DAILY   Tetrahydrozoline HCl (VISINE OP) Apply 1 drop to eye daily.   triamcinolone cream (KENALOG) 0.1 % Apply 1 application topically 2 (two) times daily.   No facility-administered encounter medications on file as of 12/18/2020.   REVIEW OF SYSTEMS:  Gen: Denies fever, sweats or chills. No weight loss.  CV: Denies chest pain, palpitations or edema. Resp: See HPI.   GI: See HPI GU : Denies urinary burning, blood in urine, increased urinary frequency or incontinence. MS: Denies joint pain, muscles aches or weakness. Derm: Denies rash, itchiness, skin lesions or unhealing ulcers. Psych: Denies depression, anxiety, memory loss, suicidal  ideation and confusion. Heme: Denies bruising, bleeding. Neuro:  Denies headaches, dizziness or paresthesias. Endo:  + DM II.  PHYSICAL EXAM: BP 128/74   Pulse 98   Ht 5\' 8"  (1.727 m)   Wt 153 lb (69.4 kg)   SpO2 97%   BMI 23.26 kg/m   General: Very pleasant 87 year old male in no acute distress. Head: Normocephalic and atraumatic. Eyes:  Sclerae non-icteric, conjunctive pink. Ears: Normal auditory acuity. Mouth: Upper and lower dentures. No ulcers or lesions.  No thrush. Neck: Supple, no lymphadenopathy or thyromegaly.  Lungs: Clear bilaterally to auscultation without wheezes, crackles or rhonchi.  On oxygen 2 L nasal cannula. Heart: Regular rate and rhythm. No murmur, rub or gallop appreciated.  Abdomen: Soft, nontender, non distended. No masses. No hepatosplenomegaly. Normoactive bowel sounds x 4 quadrants.  Rectal: Deferred. Musculoskeletal: Symmetrical with no gross deformities. Skin: Warm and dry. No rash or lesions on visible extremities. Extremities: No edema. Neurological: Alert oriented x 4, no focal deficits.  Psychological:  Alert and cooperative. Normal mood and affect.  ASSESSMENT AND PLAN:  50) 87 year old male with a history of GERD with intermittent dysphagia x 1 year. On Mobic and ASA 81mg   daily. No recent antibiotics. Last received Prednisone 02/2020. -Barium swallow with tablet -EGD deferred for now, await barium swallow results -Continue Omeprazole 20 mg twice daily -Drink 3 sips of water prior to swallowing any tablet or food, cut food in small pieces and chew food thoroughly -Patient will contact our office if his symptoms worsen  2) Colon cancer screening -No further screening colonoscopies recommended due to age  42)  COPD, Covid 25 infection 02/2020 with associated nonspecific interstitial lung disease on oxygen 2L [place]  4) DM II    CC:  Copland, Gay Filler, MD

## 2020-12-18 NOTE — Progress Notes (Signed)
I agree with the above note, plan 

## 2020-12-21 DIAGNOSIS — U071 COVID-19: Secondary | ICD-10-CM | POA: Diagnosis not present

## 2020-12-22 ENCOUNTER — Telehealth: Payer: Self-pay | Admitting: Family Medicine

## 2020-12-22 NOTE — Telephone Encounter (Signed)
Error

## 2020-12-26 NOTE — Progress Notes (Signed)
[facility] PREP Weekly Session  Patient Details  Name: DONNALD TABAR MRN: [id number] Date of Birth: [birth year] Age: 87 y.o. PCP: Darreld Mclean, MD  Vitals:   12/25/20 1330  Weight: 151 lb 8 oz (68.7 kg)     [facility] Weekly seesion - 12/26/20 1600       [facility] "PREP" Location   [facility] "PREP" Location [facility]      Weekly Session   Topic Discussed Calorie breakdown   goal setting   Minutes exercised this week 150 minutes    Classes attended to date 18            Class held on 12/25/20  [name] 12/26/2020, 4:36 PM

## 2020-12-29 ENCOUNTER — Ambulatory Visit (HOSPITAL_COMMUNITY)
Admission: RE | Admit: 2020-12-29 | Discharge: 2020-12-29 | Disposition: A | Discharge status: Home / Self Care | Payer: Medicare HMO | Source: Ambulatory Visit | Attending: Nurse Practitioner | Admitting: Nurse Practitioner

## 2020-12-29 DIAGNOSIS — K449 Diaphragmatic hernia without obstruction or gangrene: Secondary | ICD-10-CM | POA: Diagnosis not present

## 2020-12-29 DIAGNOSIS — R0989 Other specified symptoms and signs involving the circulatory and respiratory systems: Secondary | ICD-10-CM | POA: Insufficient documentation

## 2020-12-29 DIAGNOSIS — R1310 Dysphagia, unspecified: Secondary | ICD-10-CM | POA: Diagnosis not present

## 2021-01-01 ENCOUNTER — Other Ambulatory Visit: Payer: Self-pay | Admitting: Family Medicine

## 2021-01-01 NOTE — Progress Notes (Signed)
[facility] PREP Weekly Session  Patient Details  Name: Johnathan [name] MRN: [id number] Date of Birth: [birth year] Age: 87 y.o. PCP: Darreld Mclean, MD  Vitals:   01/01/21 1558  Weight: 152 lb (68.9 kg)     [facility] Weekly seesion - 01/01/21 1500       [facility] "PREP" Location   [facility] "PREP" Location [facility]      Weekly Session   Topic Discussed Hitting roadblocks    Minutes exercised this week 90 minutes    Classes attended to date 20             [name] 01/01/2021, 3:59 PM

## 2021-01-03 ENCOUNTER — Other Ambulatory Visit: Payer: Self-pay

## 2021-01-03 DIAGNOSIS — R1310 Dysphagia, unspecified: Secondary | ICD-10-CM

## 2021-01-11 ENCOUNTER — Other Ambulatory Visit: Payer: Self-pay | Admitting: Family Medicine

## 2021-01-11 NOTE — Progress Notes (Signed)
[facility] PREP Evaluation  Patient Details  Name: Johnathan [name] MRN: [id number] Date of Birth: [birth year] Age: 87 y.o. PCP: Darreld Mclean, MD  Vitals:   01/10/21 1400  BP: 136/70  Pulse: 76  SpO2: 95%  Weight: 152 lb 8 oz (69.2 kg)     [facility] Eval - 01/11/21 1600       [facility] "PREP" Location   [facility] "PREP" Location [facility]      Referral    Program Start Date 01/10/21   Final date of PREP     Measurement   Waist Circumference 40.5 inches    Hip Circumference 40 inches    Body fat --   Unable to calculate     Mobility and Daily Activities   I find it easy to walk up or down two or more flights of stairs. 1    I have no trouble taking out the trash. 2    I do housework such as vacuuming and dusting on my own without difficulty. 2    I can easily lift a gallon of milk (8lbs). 4    I can easily walk a mile. 1    I have no trouble reaching into high cupboards or reaching down to pick up something from the floor. 3    I do not have trouble doing out-door work such as Armed forces logistics/support/administrative officer, raking leaves, or gardening. 1      Mobility and Daily Activities   I feel younger than my age. 4    I feel independent. 4    I feel energetic. 2    I live an active life.  3    I feel strong. 1    I feel healthy. 2    I feel active as other people my age. 4      How fit and strong are you.   Fit and Strong Total Score 34            Past Medical History:  Diagnosis Date   Anal fissure    COPD (chronic obstructive pulmonary disease) ([place])    Diverticulosis    DM type 2 (diabetes mellitus, type 2) ([facility])    GERD (gastroesophageal reflux disease)    uses prilosec    Hypertension    Pneumonia 2018   Raynaud's disease    Shortness of breath dyspnea    WITH EXERTION   Sinusitis    Tremors of nervous system    Past Surgical History:  Procedure Laterality Date   COLONOSCOPY  03/27/2005   HERNIA REPAIR     INGUINAL HERNIA REPAIR N/A 05/06/2019   Procedure: LAPAROSCOPIC RIGHT  AND LEFT INGUINAL HERNIA(S) REPAIR;  Surgeon: Michael Boston, MD;  Location: [place];  Service: General;  Laterality: N/A;   Social History   Tobacco Use  Smoking Status Former   Packs/day: 1.00   Years: 45.00   Pack years: 45.00   Types: Cigarettes   Quit date: 01/21/1994   Years since quitting: 26.9  Smokeless Tobacco Never  Fit test: Cardio march: 150 to 184 Sit to stand: 4 to 8 Bicep curl: 15 to 17  Balance much improved Plans on continuing exercise at [facility] adding more days to week [employer] 01/11/2021, 4:38 PM

## 2021-01-16 ENCOUNTER — Telehealth: Payer: Self-pay | Admitting: Family Medicine

## 2021-01-16 NOTE — Telephone Encounter (Signed)
Left message for patient to call back and schedule Medicare Annual Wellness Visit (AWV) in office.  ° °If not able to come in office, please offer to do virtually or by telephone.  Left office number and my jabber #[phone]. ° °Due for AWVI ° °Please schedule at anytime with Nurse Health Advisor. °  °

## 2021-01-19 ENCOUNTER — Other Ambulatory Visit (HOSPITAL_BASED_OUTPATIENT_CLINIC_OR_DEPARTMENT_OTHER): Payer: Self-pay

## 2021-01-19 ENCOUNTER — Ambulatory Visit (INDEPENDENT_AMBULATORY_CARE_PROVIDER_SITE_OTHER): Payer: Medicare HMO

## 2021-01-19 VITALS — BP 118/60 | HR 68 | Temp 97.9°F | Resp 16 | Ht 70.0 in | Wt 152.5 lb

## 2021-01-19 DIAGNOSIS — Z0000 Encounter for general adult medical examination without abnormal findings: Secondary | ICD-10-CM

## 2021-01-19 MED ORDER — ZOSTER VAC RECOMB ADJUVANTED 50 MCG/0.5ML IM SUSR
INTRAMUSCULAR | 1 refills | Status: DC
  Filled 2021-01-30: qty 1, 1d supply, fill #0

## 2021-01-19 NOTE — Progress Notes (Signed)
Subjective:   Johnathan [name] is a 87 y.o. male who presents for an Initial Medicare Annual Wellness Visit.  Review of Systems     Cardiac Risk Factors include: advanced age (>19men, >76 women);male gender;diabetes mellitus;hypertension     Objective:    Today's Vitals   01/19/21 1146  BP: 118/60  Pulse: 68  Resp: 16  Temp: 97.9 F (36.6 C)  TempSrc: Oral  SpO2: 96%  Weight: 152 lb 8 oz (69.2 kg)  Height: 5\' 10"  (1.778 m)   Body mass index is 21.88 kg/m.  Advanced Directives 01/19/2021 06/01/2020 02/25/2020 02/20/2020 05/06/2019 04/21/2014  Does Patient Have a Medical Advance Directive? Yes No No No Yes Yes  Type of Paramedic of [place];Living will - - - [place];Living will Living will  Does patient want to make changes to medical advance directive? - - - - No - Patient declined No - Patient declined  Copy of [place] in Chart? Yes - validated most recent copy scanned in chart (See row information) - - - No - copy requested No - copy requested  Would patient like information on creating a medical advance directive? - No - Patient declined No - Patient declined - - -    Current Medications (verified) Outpatient Encounter Medications as of 01/19/2021  Medication Sig   amLODipine (NORVASC) 5 MG tablet Take 1 tablet (5 mg total) by mouth daily.   aspirin EC 81 MG tablet Take 81 mg by mouth daily.   Cyanocobalamin (B-12) 1000 MCG CAPS Take 1 capsule by mouth daily.   Flaxseed, Linseed, (FLAXSEED OIL) 1200 MG CAPS Take 1 capsule by mouth daily.   Fluticasone-Umeclidin-Vilant (TRELEGY ELLIPTA) 100-62.5-25 MCG/INH AEPB INHALE 1 PUFF INTO THE LUNGS DAILY.   furosemide (LASIX) 20 MG tablet Take 1 tablet (20 mg total) by mouth daily as needed for edema.   Ginkgo Biloba EXTR Take 1 tablet by mouth 2 (two) times daily.   ketoconazole (NIZORAL) 2 % shampoo APPLY TO AFFECTED AREA(S) TOPICALLY TWO TIMES PER WEEK   loratadine  (CLARITIN) 10 MG tablet TAKE 1 TABLET EVERY DAY   meloxicam (MOBIC) 15 MG tablet TAKE 1 TABLET EVERY DAY AS NEEDED FOR JOINT PAIN   metFORMIN (GLUCOPHAGE) 500 MG tablet Take 0.5 tablets (250 mg total) by mouth daily.   Misc Natural Products (HIMALAYAN GOJI PO) Take 1 tablet by mouth in the morning and at bedtime.    Multiple Vitamin (MULTIVITAMIN WITH MINERALS) TABS tablet Take 1 tablet by mouth daily.   omeprazole (PRILOSEC) 20 MG capsule TAKE 1 CAPSULE TWICE DAILY   pravastatin (PRAVACHOL) 40 MG tablet TAKE 1/2 TABLET EVERY DAY   primidone (MYSOLINE) 50 MG tablet TAKE 1 TABLET TWICE DAILY   sildenafil (VIAGRA) 50 MG tablet TAKE 1 TABLET BY MOUTH DAILY AS NEEDED FOR ERECTILE DYSFUNCTION.   terazosin (HYTRIN) 1 MG capsule TAKE 1 CAPSULE TWICE DAILY   Tetrahydrozoline HCl (VISINE OP) Apply 1 drop to eye daily.   triamcinolone cream (KENALOG) 0.1 % APPLY 1 APPLICATION TOPICALLY 2 (TWO) TIMES DAILY.   No facility-administered encounter medications on file as of 01/19/2021.    Allergies (verified) Patient has no known allergies.   History: Past Medical History:  Diagnosis Date   Anal fissure    COPD (chronic obstructive pulmonary disease) ([place])    Diverticulosis    DM type 2 (diabetes mellitus, type 2) ([facility])    GERD (gastroesophageal reflux disease)    uses prilosec  Hypertension    Pneumonia 2018   Raynaud's disease    Shortness of breath dyspnea    WITH EXERTION   Sinusitis    Tremors of nervous system    Past Surgical History:  Procedure Laterality Date   COLONOSCOPY  03/27/2005   HERNIA REPAIR     INGUINAL HERNIA REPAIR N/A 05/06/2019   Procedure: LAPAROSCOPIC RIGHT AND LEFT INGUINAL HERNIA(S) REPAIR;  Surgeon: Michael Boston, MD;  Location: [facility];  Service: General;  Laterality: N/A;   Family History  Problem Relation Age of Onset   Dementia Brother    Diabetes Brother    Heart attack Father    Dementia Father    Social History   Socioeconomic  History   Marital status: Single    Spouse name: Not on file   Number of children: Not on file   Years of education: Not on file   Highest education level: Not on file  Occupational History   Not on file  Tobacco Use   Smoking status: Former    Packs/day: 1.00    Years: 45.00    Pack years: 45.00    Types: Cigarettes    Quit date: 01/21/1994    Years since quitting: 27.0   Smokeless tobacco: Never  Substance and Sexual Activity   Alcohol use: Yes    Comment: a drink daily with dinner   Drug use: No   Sexual activity: Not on file  Other Topics Concern   Not on file  Social History Narrative   Not on file   Social Determinants of Health   Financial Resource Strain: Low Risk    Difficulty of Paying Living Expenses: Not hard at all  Food Insecurity: No Food Insecurity   Worried About Charity fundraiser in the Last Year: Never true   Lint in the Last Year: Never true  Transportation Needs: No Transportation Needs   Lack of Transportation (Medical): No   Lack of Transportation (Non-Medical): No  Physical Activity: Inactive   Days of Exercise per Week: 0 days   Minutes of Exercise per Session: 0 min  Stress: No Stress Concern Present   Feeling of Stress : Not at all  Social Connections: Moderately Isolated   Frequency of Communication with Friends and Family: More than three times a week   Frequency of Social Gatherings with Friends and Family: Once a week   Attends Religious Services: 1 to 4 times per year   Active Member of [employer] or Organizations: No   Attends Music therapist: Never   Marital Status: Never married    Tobacco Counseling Counseling given: Not Answered   Clinical Intake:  Pre-visit preparation completed: Yes  Pain : No/denies pain     BMI - recorded: 21.88 Nutritional Status: BMI of 19-24  Normal Nutritional Risks: None Diabetes: Yes CBG done?: No Did pt. bring in CBG monitor from home?: No  How often do you need  to have someone help you when you read instructions, pamphlets, or other written materials from your doctor or pharmacy?: 1 - Never  Diabetes:  Is the patient diabetic?  Yes  If diabetic, was a CBG obtained today?  No  Did the patient bring in their glucometer from home?  No  How often do you monitor your CBG's? never.   Financial Strains and Diabetes Management:  Are you having any financial strains with the device, your supplies or your medication? No .  Does the  patient want to be seen by Chronic Care Management for management of their diabetes?  No  Would the patient like to be referred to a Nutritionist or for Diabetic Management?  No   Diabetic Exams:  Diabetic Eye Exam: Overdue for diabetic eye exam. Pt has been advised about the importance in completing this exam.  Diabetic Foot Exam: Pt has been advised about the importance in completing this exam. To be completed by PCP.   Interpreter Needed?: No  Information entered by :: Caroleen Hamman LPN   Activities of Daily Living In your present state of health, do you have any difficulty performing the following activities: 01/19/2021 06/01/2020  Hearing? Y Y  Comment hearing aids wears hearing aids  Vision? N N  Difficulty concentrating or making decisions? N N  Walking or climbing stairs? Y N  Comment stairs -  Dressing or bathing? N N  Doing errands, shopping? N N  Preparing Food and eating ? N N  Using the Toilet? N N  In the past six months, have you accidently leaked urine? Y N  Do you have problems with loss of bowel control? N N  Managing your Medications? N N  Managing your Finances? N N  Housekeeping or managing your Housekeeping? N N  Some recent data might be hidden    Patient Care Team: Copland, Gay Filler, MD as PCP - General (Family Medicine) O'Neal, Cassie Freer, MD as PCP - Cardiology (Cardiology) Michael Boston, MD as Consulting Physician (General Surgery) Florance, Tomasa Blase, RN as [place] any recent [place] you may have received from other than [facility] providers in the past year (date may be approximate).     Assessment:   This is a routine wellness examination for [place].  Hearing/Vision screen Hearing Screening - Comments:: Bilateral hearing aids Vision Screening - Comments:: Last eye exam-2022-Dr. Groat  Dietary issues and exercise activities discussed: Current Exercise Habits: Structured exercise class, Frequency (Times/Week): 2, Exercise limited by: Other - see comments (uses oxygen)   Goals Addressed             This Visit's Progress    Patient Stated       Eat more vegetables & drink more goals       Depression Screen PHQ 2/9 Scores 01/19/2021 10/10/2020 08/04/2020 06/01/2020 05/30/2020  PHQ - 2 Score 0 0 0 0 0  PHQ- 9 Score - 0 - - -    Fall Risk Fall Risk  01/19/2021 08/23/2020 07/03/2020 06/01/2020 05/30/2020  Falls in the past year? 0 0 0 0 0  Number falls in past yr: 0 0 0 - 0  Injury with Fall? 0 0 0 - 0  Risk for fall due to : - - No Fall Risks - No Fall Risks  Follow up Falls prevention discussed Falls evaluation completed Falls evaluation completed - Falls evaluation completed    FALL RISK PREVENTION PERTAINING TO THE HOME:  Any stairs in or around the home? Yes  If so, are there any without handrails? No  Home free of loose throw rugs in walkways, pet beds, electrical cords, etc? Yes  O2 tubing Adequate lighting in your home to reduce risk of falls? Yes   ASSISTIVE DEVICES UTILIZED TO PREVENT FALLS:  Life alert? No  Use of a cane, walker or w/c? No  Grab bars in the bathroom? Yes  Shower chair or bench in shower? No  Elevated toilet seat or a handicapped toilet? Yes  elevated seat  TIMED UP AND GO:  Was the test performed? Yes .  Length of time to ambulate 10 feet: 12 sec.   Gait slow and steady without use of assistive device  Cognitive Function:Normal cognitive status assessed by  direct observation by this Nurse Health Advisor. No abnormalities found.          Immunizations Immunization History  Administered Date(s) Administered   Fluad Quad(high Dose 65+) 10/18/2019   Influenza-Unspecified 10/20/2013, 10/30/2020   PFIZER(Purple Top)SARS-COV-2 Vaccination 02/05/2019, 03/08/2019   PNEUMOCOCCAL CONJUGATE-20 10/30/2020   Pfizer Covid-19 Vaccine Bivalent Booster 26yrs & up 10/30/2020   Pneumococcal Conjugate-13 11/25/2019   Pneumococcal Polysaccharide-23 12/27/2007   Td 11/27/2007, 07/31/2020   Zoster, Live 12/27/2006    TDAP status: Up to date  Flu Vaccine status: Up to date  Pneumococcal vaccine status: Up to date  Covid-19 vaccine status: Completed vaccines  Qualifies for Shingles Vaccine? Yes   Zostavax completed Yes   Shingrix Completed?: No.    Education has been provided regarding the importance of this vaccine. Patient has been advised to call insurance company to determine out of pocket expense if they have not yet received this vaccine. Advised may also receive vaccine at local pharmacy or Health Dept. Verbalized acceptance and understanding.  Screening Tests Health Maintenance  Topic Date Due   OPHTHALMOLOGY EXAM  Never done   Zoster Vaccines- Shingrix (1 of 2) Never done   FOOT EXAM  11/24/2020   COVID-19 Vaccine (4 - Booster for Pfizer series) 12/25/2020   HEMOGLOBIN A1C  01/31/2021   TETANUS/TDAP  08/01/2030   Pneumonia Vaccine 36+ Years old  Completed   INFLUENZA VACCINE  Completed   HPV VACCINES  Aged Out    Health Maintenance  Health Maintenance Due  Topic Date Due   OPHTHALMOLOGY EXAM  Never done   Zoster Vaccines- Shingrix (1 of 2) Never done   FOOT EXAM  11/24/2020   COVID-19 Vaccine (4 - Booster for Pfizer series) 12/25/2020    Colorectal cancer screening: No longer required.   Lung Cancer Screening: (Low Dose CT Chest recommended if Age 78-80 years, 30 pack-year currently smoking OR have quit w/in 15years.) does not  qualify.     Additional Screening:  Hepatitis C Screening: does not qualify  Vision Screening: Recommended annual ophthalmology exams for early detection of glaucoma and other disorders of the eye. Is the patient up to date with their annual eye exam?  Yes  Who is the provider or what is the name of the office in which the patient attends annual eye exams? Dr. Katy Fitch   Dental Screening: Recommended annual dental exams for proper oral hygiene  Community Resource Referral / Chronic Care Management: CRR required this visit?  No   CCM required this visit?  No      Plan:     I have personally reviewed and noted the following in the patients chart:   Medical and social history Use of alcohol, tobacco or illicit drugs  Current medications and supplements including opioid prescriptions. Patient is not currently taking opioid prescriptions. Functional ability and status Nutritional status Physical activity Advanced directives List of other physicians Hospitalizations, surgeries, and ER visits in previous 12 months Vitals Screenings to include cognitive, depression, and falls Referrals and appointments  In addition, I have reviewed and discussed with patient certain preventive protocols, quality metrics, and best practice recommendations. A written personalized care plan for preventive services as well as general preventive health recommendations were provided to patient.  Marta Antu, LPN   80/88/1103  Nurse Health Advisor  Nurse Notes: None

## 2021-01-19 NOTE — Patient Instructions (Signed)
[name] , Thank you for taking time to complete your Medicare Wellness Visit. I appreciate your ongoing commitment to your health goals. Please review the following plan we discussed and let me know if I can assist you in the future.   Screening recommendations/referrals: Colonoscopy: No longer required Recommended yearly ophthalmology/optometry visit for glaucoma screening and checkup Recommended yearly dental visit for hygiene and checkup  Vaccinations: Influenza vaccine: Up to date Pneumococcal vaccine: Up to date Tdap vaccine: Up to date Shingles vaccine: Discuss with pharmacy   Covid-19: Up to date  Advanced directives: Copy in chart  Conditions/risks identified: See problem list  Next appointment: Follow up in one year for your annual wellness visit. 01/24/2022 @ 11:40  Preventive Care 85 Years and Older, Male Preventive care refers to lifestyle choices and visits with your health care provider that can promote health and wellness. What does preventive care include? A yearly physical exam. This is also called an annual well check. Dental exams once or twice a year. Routine eye exams. Ask your health care provider how often you should have your eyes checked. Personal lifestyle choices, including: Daily care of your teeth and gums. Regular physical activity. Eating a healthy diet. Avoiding tobacco and drug use. Limiting alcohol use. Practicing safe sex. Taking low doses of aspirin every day. Taking vitamin and mineral supplements as recommended by your health care provider. What happens during an annual well check? The services and screenings done by your health care provider during your annual well check will depend on your age, overall health, lifestyle risk factors, and family history of disease. Counseling  Your health care provider may ask you questions about your: Alcohol use. Tobacco use. Drug use. Emotional well-being. Home and relationship well-being. Sexual  activity. Eating habits. History of falls. Memory and ability to understand (cognition). Work and work Statistician. Screening  You may have the following tests or measurements: Height, weight, and BMI. Blood pressure. Lipid and cholesterol levels. These may be checked every 5 years, or more frequently if you are over 41 years old. Skin check. Lung cancer screening. You may have this screening every year starting at age 66 if you have a 30-pack-year history of smoking and currently smoke or have quit within the past 15 years. Fecal occult blood test (FOBT) of the stool. You may have this test every year starting at age 15. Flexible sigmoidoscopy or colonoscopy. You may have a sigmoidoscopy every 5 years or a colonoscopy every 10 years starting at age 49. Prostate cancer screening. Recommendations will vary depending on your family history and other risks. Hepatitis C blood test. Hepatitis B blood test. Sexually transmitted disease (STD) testing. Diabetes screening. This is done by checking your blood sugar (glucose) after you have not eaten for a while (fasting). You may have this done every 1-3 years. Abdominal aortic aneurysm (AAA) screening. You may need this if you are a current or former smoker. Osteoporosis. You may be screened starting at age 72 if you are at high risk. Talk with your health care provider about your test results, treatment options, and if necessary, the need for more tests. Vaccines  Your health care provider may recommend certain vaccines, such as: Influenza vaccine. This is recommended every year. Tetanus, diphtheria, and acellular pertussis (Tdap, Td) vaccine. You may need a Td booster every 10 years. Zoster vaccine. You may need this after age 6. Pneumococcal 13-valent conjugate (PCV13) vaccine. One dose is recommended after age 92. Pneumococcal polysaccharide (PPSV23) vaccine. One dose  is recommended after age 59. Talk to your health care provider about which  screenings and vaccines you need and how often you need them. This information is not intended to replace advice given to you by your health care provider. Make sure you discuss any questions you have with your health care provider. Document Released: 02/03/2015 Document Revised: 09/27/2015 Document Reviewed: 11/08/2014 Elsevier Interactive Patient Education  2017 [place] Prevention in the Home Falls can cause injuries. They can happen to people of all ages. There are many things you can do to make your home safe and to help prevent falls. What can I do on the outside of my home? Regularly fix the edges of walkways and driveways and fix any cracks. Remove anything that might make you trip as you walk through a door, such as a raised step or threshold. Trim any bushes or trees on the path to your home. Use bright outdoor lighting. Clear any walking paths of anything that might make someone trip, such as rocks or tools. Regularly check to see if handrails are loose or broken. Make sure that both sides of any steps have handrails. Any raised decks and porches should have guardrails on the edges. Have any leaves, snow, or ice cleared regularly. Use sand or salt on walking paths during winter. Clean up any spills in your garage right away. This includes oil or grease spills. What can I do in the bathroom? Use night lights. Install grab bars by the toilet and in the tub and shower. Do not use towel bars as grab bars. Use non-skid mats or decals in the tub or shower. If you need to sit down in the shower, use a plastic, non-slip stool. Keep the floor dry. Clean up any water that spills on the floor as soon as it happens. Remove soap buildup in the tub or shower regularly. Attach bath mats securely with double-sided non-slip rug tape. Do not have throw rugs and other things on the floor that can make you trip. What can I do in the bedroom? Use night lights. Make sure that you have a  light by your bed that is easy to reach. Do not use any sheets or blankets that are too big for your bed. They should not hang down onto the floor. Have a firm chair that has side arms. You can use this for support while you get dressed. Do not have throw rugs and other things on the floor that can make you trip. What can I do in the kitchen? Clean up any spills right away. Avoid walking on wet floors. Keep items that you use a lot in easy-to-reach places. If you need to reach something above you, use a strong step stool that has a grab bar. Keep electrical cords out of the way. Do not use floor polish or wax that makes floors slippery. If you must use wax, use non-skid floor wax. Do not have throw rugs and other things on the floor that can make you trip. What can I do with my stairs? Do not leave any items on the stairs. Make sure that there are handrails on both sides of the stairs and use them. Fix handrails that are broken or loose. Make sure that handrails are as long as the stairways. Check any carpeting to make sure that it is firmly attached to the stairs. Fix any carpet that is loose or worn. Avoid having throw rugs at the top or bottom of the stairs.  If you do have throw rugs, attach them to the floor with carpet tape. Make sure that you have a light switch at the top of the stairs and the bottom of the stairs. If you do not have them, ask someone to add them for you. What else can I do to help prevent falls? Wear shoes that: Do not have high heels. Have rubber bottoms. Are comfortable and fit you well. Are closed at the toe. Do not wear sandals. If you use a stepladder: Make sure that it is fully opened. Do not climb a closed stepladder. Make sure that both sides of the stepladder are locked into place. Ask someone to hold it for you, if possible. Clearly mark and make sure that you can see: Any grab bars or handrails. First and last steps. Where the edge of each step  is. Use tools that help you move around (mobility aids) if they are needed. These include: Canes. Walkers. Scooters. Crutches. Turn on the lights when you go into a dark area. Replace any light bulbs as soon as they burn out. Set up your furniture so you have a clear path. Avoid moving your furniture around. If any of your floors are uneven, fix them. If there are any pets around you, be aware of where they are. Review your medicines with your doctor. Some medicines can make you feel dizzy. This can increase your chance of falling. Ask your doctor what other things that you can do to help prevent falls. This information is not intended to replace advice given to you by your health care provider. Make sure you discuss any questions you have with your health care provider. Document Released: 11/03/2008 Document Revised: 06/15/2015 Document Reviewed: 02/11/2014 Elsevier Interactive Patient Education  2017 [employer].

## 2021-01-21 DIAGNOSIS — U071 COVID-19: Secondary | ICD-10-CM | POA: Diagnosis not present

## 2021-01-23 ENCOUNTER — Other Ambulatory Visit: Payer: Self-pay

## 2021-01-23 NOTE — Patient Outreach (Addendum)
[facility]) Care Management  01/23/2021  OREE HISLOP January 29, 1932 [id number]   Telephone Assessment  Successful outreach call placed to patient. He reports that he has been doing well-denies any acute issues or concerns at present. He continues to reside in his home alone. He is independent with ADLs and IADLs. Denies any recent falls. Appetite good and wgt stable. He denies any RN CM needs or concerns at this time.    Medications Reviewed Today     Reviewed by Hayden Pedro, RN (Registered Nurse) on 01/23/21 at [phone]  Med List Status: <None>   Medication Order Taking? Sig Documenting Provider Last Dose Status Informant  amLODipine (NORVASC) 5 MG tablet [id number] No Take 1 tablet (5 mg total) by mouth daily. Copland, Gay Filler, MD Taking Active   aspirin EC 81 MG tablet [id number] No Take 81 mg by mouth daily. [provider] Taking Active Self  Cyanocobalamin (B-12) 1000 MCG CAPS [id number] No Take 1 capsule by mouth daily. [provider] Taking Active Self  Flaxseed, Linseed, (FLAXSEED OIL) 1200 MG CAPS [id number] No Take 1 capsule by mouth daily. [provider] Taking Active Self  Fluticasone-Umeclidin-Vilant (TRELEGY ELLIPTA) 100-62.5-25 MCG/INH AEPB [id number] No INHALE 1 PUFF INTO THE LUNGS DAILY. Marshell Garfinkel, MD Taking Active   furosemide (LASIX) 20 MG tablet [id number] No Take 1 tablet (20 mg total) by mouth daily as needed for edema. Marrian Salvage, FNP Taking Active   Ginkgo Kathyrn Sheriff [id number] No Take 1 tablet by mouth 2 (two) times daily. [provider] Taking Active Self  ketoconazole (NIZORAL) 2 % shampoo [id number] No APPLY TO AFFECTED AREA(S) TOPICALLY TWO TIMES PER WEEK Copland, Gay Filler, MD Taking Active   loratadine (CLARITIN) 10 MG tablet [id number] No TAKE 1 TABLET EVERY DAY Copland, Gay Filler, MD Taking Active   meloxicam (MOBIC) 15 MG tablet [id number] No TAKE 1 TABLET EVERY DAY AS NEEDED FOR  JOINT PAIN Copland, Gay Filler, MD Taking Active   metFORMIN (GLUCOPHAGE) 500 MG tablet [id number] No Take 0.5 tablets (250 mg total) by mouth daily. Copland, Gay Filler, MD Taking Active   Misc Natural Products (HIMALAYAN GOJI PO) [id number] No Take 1 tablet by mouth in the morning and at bedtime.  [provider] Taking Active Self  Multiple Vitamin (MULTIVITAMIN WITH MINERALS) TABS tablet [id number] No Take 1 tablet by mouth daily. [provider] Taking Active Self  omeprazole (PRILOSEC) 20 MG capsule [id number] No TAKE 1 CAPSULE TWICE DAILY Copland, Gay Filler, MD Taking Active   pravastatin (PRAVACHOL) 40 MG tablet [id number] No TAKE 1/2 TABLET EVERY DAY Copland, Gay Filler, MD Taking Active   primidone (MYSOLINE) 50 MG tablet [id number] No TAKE 1 TABLET TWICE DAILY Copland, Gay Filler, MD Taking Active   sildenafil (VIAGRA) 50 MG tablet [id number] No TAKE 1 TABLET BY MOUTH DAILY AS NEEDED FOR ERECTILE DYSFUNCTION. Copland, Gay Filler, MD Taking Active   terazosin (HYTRIN) 1 MG capsule [id number] No TAKE 1 CAPSULE TWICE DAILY Copland, Gay Filler, MD Taking Active   Tetrahydrozoline HCl (VISINE OP) [id number] No Apply 1 drop to eye daily. [provider] Taking Active Self  triamcinolone cream (KENALOG) 0.1 % [id number] No APPLY 1 APPLICATION TOPICALLY 2 (TWO) TIMES DAILY. Copland, Gay Filler, MD Taking Active   Zoster Vaccine Adjuvanted [facility]) injection [id number]  Inject into the muscle. Carlyle Basques, MD  Active              Care Plan : COPD (Adult)  Updates made  by Hayden Pedro, RN since 01/23/2021 12:00 AM     Problem: Symptom Exacerbation (COPD)      Long-Range Goal: Symptom Exacerbation Prevented or Minimized Completed 01/23/2021  Start Date: 06/01/2020  Expected End Date: 01/20/2021  This Visit's Progress: On track  Recent Progress: On track  Priority: High  Note:    Notes:    08/23/20 RN CM instructed in the importance of med adherence to  prevent symptom exacerbation. RN CM confirmed pt has MD follow up appt. in place and no transportation barriers.  RN CM discussed environmental triggers and ways to manage/treat.  Resolving due to duplicate care plan and goals    Task: Identify and Minimize Risk of COPD Exacerbation Completed 01/23/2021  Due Date: 01/20/2021  Priority: Routine  Responsible User: Hayden Pedro, RN  Note:   Care Management Activities:    - rescue (action) plan reviewed - signs/symptoms of infection reviewed    Notes: 06/01/20 Patient managing well with COPD with use of Trelegy.    Care Plan : RN Care Manager POC  Updates made by Hayden Pedro, RN since 01/23/2021 12:00 AM     Problem: Chronic Disease Mgmt of Chronic Condition-COPD   Priority: High     Long-Range Goal: Development of POC for Mgmt of Chronic Condition-COPD   Start Date: 01/23/2021  Expected End Date: 01/23/2021  Priority: High  Note:    Current Barriers:  Chronic Disease Management support and education needs related to COPD   RNCM Clinical Goal(s):  Patient will verbalize understanding of plan for management of COPD as evidenced by mgmt of chronic condition continue to work with RN Care Manager to address care management and care coordination needs related to  COPD as evidenced by adherence to CM Team Scheduled appointments through collaboration with RN Care manager, provider, and care team.   Interventions: POC sent to PCP upon initial assessment, quarterly and with any changes in patient's conditions Inter-disciplinary care team collaboration (see longitudinal plan of care) Evaluation of current treatment plan related to  self management and patient's adherence to plan as established by provider   COPD Interventions:  (Status:  New goal.) Long Term Goal Advised patient to track and manage COPD triggers Advised patient to self assesses COPD action plan zone and make appointment with provider if in the  yellow zone for 48 hours without improvement Discussed the importance of adequate rest and management of fatigue with COPD  Patient Goals/Self-Care Activities: Take all medications as prescribed Attend all scheduled provider appointments Perform all self care activities independently  Perform IADL's (shopping, preparing meals, housekeeping, managing finances) independently Call provider office for new concerns or questions  follow rescue plan if symptoms flare-up  Follow Up Plan:  Telephone follow up appointment with care management team member scheduled for:  quarterly-within the month of April The patient has been provided with contact information for the care management team and has been advised to call with any health related questions or concerns.       Plan: RN CM discussed with patient next quarterly outreach within the month of April. Patient agrees to care plan and follow up. RN CM will send quarterly update to PCP.   Enzo Montgomery, RN,BSN,CCM [place] Management Telephonic Care Management Coordinator Direct Phone: [phone] Toll Free: [phone] Fax: [phone]

## 2021-01-30 ENCOUNTER — Encounter: Payer: Self-pay | Admitting: Family

## 2021-01-30 ENCOUNTER — Encounter (HOSPITAL_COMMUNITY): Payer: Self-pay | Admitting: Gastroenterology

## 2021-01-30 ENCOUNTER — Other Ambulatory Visit: Payer: Self-pay

## 2021-01-30 ENCOUNTER — Other Ambulatory Visit (HOSPITAL_BASED_OUTPATIENT_CLINIC_OR_DEPARTMENT_OTHER): Payer: Self-pay

## 2021-01-30 ENCOUNTER — Ambulatory Visit (INDEPENDENT_AMBULATORY_CARE_PROVIDER_SITE_OTHER): Payer: Commercial Managed Care - HMO | Admitting: Family

## 2021-01-30 VITALS — BP 138/78 | HR 73 | Temp 95.6°F | Ht 69.0 in | Wt 153.0 lb

## 2021-01-30 DIAGNOSIS — M65341 Trigger finger, right ring finger: Secondary | ICD-10-CM | POA: Diagnosis not present

## 2021-01-30 DIAGNOSIS — I10 Essential (primary) hypertension: Secondary | ICD-10-CM

## 2021-01-30 MED ORDER — PRAVASTATIN SODIUM 40 MG PO TABS: 20.0000 mg | ORAL_TABLET | Freq: Every day | ORAL | 3 refills | Status: DC

## 2021-01-30 MED ORDER — METFORMIN HCL 500 MG PO TABS: 250.0000 mg | ORAL_TABLET | Freq: Every day | ORAL | 3 refills | Status: DC

## 2021-01-30 MED ORDER — TERAZOSIN HCL 1 MG PO CAPS: 1.0000 mg | ORAL_CAPSULE | Freq: Two times a day (BID) | ORAL | 3 refills | Status: DC

## 2021-01-30 MED ORDER — OMEPRAZOLE 20 MG PO CPDR: 20.0000 mg | DELAYED_RELEASE_CAPSULE | Freq: Two times a day (BID) | ORAL | 3 refills | Status: DC

## 2021-01-30 MED ORDER — AMLODIPINE BESYLATE 5 MG PO TABS: 5.0000 mg | ORAL_TABLET | Freq: Every day | ORAL | 3 refills | Status: DC

## 2021-01-30 MED ORDER — PRIMIDONE 50 MG PO TABS: 50.0000 mg | ORAL_TABLET | Freq: Two times a day (BID) | ORAL | 3 refills | Status: DC

## 2021-01-30 NOTE — Progress Notes (Signed)
Johnathan [name] is a 88 y.o. male with the following history as recorded in EpicCare:  Patient Active Problem List   Diagnosis Date Noted   Pulmonary emphysema ([place]) 04/26/2020   COVID-19 02/25/2020   Shingles 11/14/2019   Raynaud's syndrome 11/14/2019   Skin cancer 11/14/2019   Left inguinal hernia s/p lap repair w mesh 05/06/2019 05/06/2019   Recurrent right inguinal hernia s/p lap repair w mesh 05/06/2019 03/01/2019   Diabetes mellitus ([place]) 03/01/2019   Pneumonia 04/21/2014   COPD with acute exacerbation ([place]) 04/21/2014    Current Outpatient Medications  Medication Sig Dispense Refill   amLODipine (NORVASC) 5 MG tablet Take 1 tablet (5 mg total) by mouth daily. 90 tablet 3   aspirin EC 81 MG tablet Take 81 mg by mouth daily.     Cyanocobalamin (B-12) 1000 MCG CAPS Take 1 capsule by mouth daily.     Flaxseed, Linseed, (FLAXSEED OIL) 1200 MG CAPS Take 1 capsule by mouth daily.     Fluticasone-Umeclidin-Vilant (TRELEGY ELLIPTA) 100-62.5-25 MCG/INH AEPB INHALE 1 PUFF INTO THE LUNGS DAILY. 180 each 3   furosemide (LASIX) 20 MG tablet Take 1 tablet (20 mg total) by mouth daily as needed for edema. 30 tablet 0   Ginkgo Biloba EXTR Take 1 tablet by mouth 2 (two) times daily.     ketoconazole (NIZORAL) 2 % shampoo APPLY TO AFFECTED AREA(S) TOPICALLY TWO TIMES PER WEEK 120 mL 0   loratadine (CLARITIN) 10 MG tablet TAKE 1 TABLET EVERY DAY 90 tablet 1   meloxicam (MOBIC) 15 MG tablet TAKE 1 TABLET EVERY DAY AS NEEDED FOR JOINT PAIN 90 tablet 0   metFORMIN (GLUCOPHAGE) 500 MG tablet Take 0.5 tablets (250 mg total) by mouth daily. 90 tablet 1   Misc Natural Products (HIMALAYAN GOJI PO) Take 1 tablet by mouth in the morning and at bedtime.      Multiple Vitamin (MULTIVITAMIN WITH MINERALS) TABS tablet Take 1 tablet by mouth daily.     omeprazole (PRILOSEC) 20 MG capsule TAKE 1 CAPSULE TWICE DAILY 180 capsule 1   pravastatin (PRAVACHOL) 40 MG tablet TAKE 1/2 TABLET EVERY DAY 45 tablet 1   primidone  (MYSOLINE) 50 MG tablet TAKE 1 TABLET TWICE DAILY 180 tablet 1   sildenafil (VIAGRA) 50 MG tablet TAKE 1 TABLET BY MOUTH DAILY AS NEEDED FOR ERECTILE DYSFUNCTION. 10 tablet 3   terazosin (HYTRIN) 1 MG capsule TAKE 1 CAPSULE TWICE DAILY 180 capsule 1   Tetrahydrozoline HCl (VISINE OP) Apply 1 drop to eye daily.     triamcinolone cream (KENALOG) 0.1 % APPLY 1 APPLICATION TOPICALLY 2 (TWO) TIMES DAILY. 80 g 0   Zoster Vaccine Adjuvanted [facility]) injection Inject into the muscle. 1 each 1   No current facility-administered medications for this visit.    Allergies: Patient has no known allergies.  Past Medical History:  Diagnosis Date   Anal fissure    COPD (chronic obstructive pulmonary disease) ([place])    Diverticulosis    DM type 2 (diabetes mellitus, type 2) (HCC)    GERD (gastroesophageal reflux disease)    uses prilosec    Hypertension    Pneumonia 2018   Raynaud's disease    Shortness of breath dyspnea    WITH EXERTION   Sinusitis    Tremors of nervous system     Past Surgical History:  Procedure Laterality Date   COLONOSCOPY  03/27/2005   HERNIA REPAIR     INGUINAL HERNIA REPAIR N/A 05/06/2019   Procedure: LAPAROSCOPIC  RIGHT AND LEFT INGUINAL HERNIA(S) REPAIR;  Surgeon: Johnathan Boston, MD;  Location: [facility];  Service: General;  Laterality: N/A;    Family History  Problem Relation Age of Onset   Dementia Brother    Diabetes Brother    Heart attack Father    Dementia Father     Social History   Tobacco Use   Smoking status: Former    Packs/day: 1.00    Years: 45.00    Pack years: 45.00    Types: Cigarettes    Quit date: 01/21/1994    Years since quitting: 27.0   Smokeless tobacco: Never  Substance Use Topics   Alcohol use: Yes    Comment: a drink daily with dinner    Subjective:  Concerned for worsening symptoms with trigger finger; discussed this with his PCP at [facility] in early November but did not want to see orthopedist at that time; Has now  decided that he would like to pursue treatment and is open to referral to orthopedist;  Finger is 4th finger right hand;      Objective:  Vitals:   01/30/21 1333  BP: 138/78  Pulse: 73  Temp: (!) 95.6 F (35.3 C)  TempSrc: Oral  SpO2: 95%  Weight: 153 lb (69.4 kg)  Height: 5\' 9"  (1.753 m)    General: Well developed, well nourished, in no acute distress  Skin : Warm and dry.  Head: Normocephalic and atraumatic  Lungs: Respirations unlabored; on oxygen;  Neurologic: Alert and oriented; speech intact; face symmetrical; moves all extremities well; CNII-XII intact without focal deficit   Assessment:  1. Trigger ring finger of right hand     Plan:  Will refer to orthopedist for treatment;  Patient was very frustrated during course of visit that he was not sure about new pharmacy/ mail order provider since changing to [facility]; he understands to call back once he has this information and Dr. Lorelei Pont can update his prescriptions;   He also mentioned that he wanted to discuss Tramadol which he had taken from another provider "years ago." He was informed that he would need to discuss this with his PCP since this is a controlled substance and he expressed understanding.   This visit occurred during the SARS-CoV-2 public health emergency.  Safety protocols were in place, including screening questions prior to the visit, additional usage of staff PPE, and extensive cleaning of exam room while observing appropriate contact time as indicated for disinfecting solutions.     No follow-ups on file.  Orders Placed This Encounter  Procedures   Ambulatory referral to Orthopedic Surgery    Referral Priority:   Routine    Referral Type:   Surgical    Referral Reason:   Specialty Services Required    Requested Specialty:   Orthopedic Surgery    Number of Visits Requested:   1    Requested Prescriptions    No prescriptions requested or ordered in this encounter

## 2021-01-30 NOTE — Telephone Encounter (Signed)
Pt came to office today for trigger finger issues. That concern has been addressed by Mickel Baas today.   Pt reports that he has switched insurances and would like a refill of all of his medications to be sent to [facility].   I have informed pt that I will send a message to the provider and ask her if that is okay. I have pended his chronic mediation that we fill for him and pended the pharmacy.   Please advise on the refills.

## 2021-02-06 ENCOUNTER — Telehealth: Payer: Self-pay | Admitting: Gastroenterology

## 2021-02-06 NOTE — Progress Notes (Addendum)
[place] [place] [facility] [address] [place], [place] [address] [phone]  Date:  02/12/2021   Name:  Johnathan [name]   DOB:  December 21, 1932   MRN:  [id number]  PCP:  Darreld Mclean, MD    Chief Complaint: trigger finger   History of Present Illness:  Johnathan [name] is a 88 y.o. very pleasant male patient who presents with the following:  Patient is seen today with concern of trigger finger Most recent visit with myself was in November History of emphysema, diabetes, COPD, hypertension, former smoker Currently been doing excellently for age until he got [place] in February 2022; he subsequently developed pulmonary fibrosis and is still using oxygen.  He does have a portable concentrator He saw my partner Jodi Mourning on January 10 with concern of trigger finger-she made a referral to orthopedics at that time.  However, the patient never heard about this appointment.  We called [facility] and it looks like his date of birth was incorrect in her system.  It looks like he also want to talk about taking tramadol, Mickel Baas deferred this to me as his PCP-however, patient does not mention it to me today.  Due for foot exam Can update labs and A1c today if he would like  He saw Dr Edison Nasuti recently for endoscopy - they noticed possible candida infection and started him on diflucan for 10 days Naszir was worried about mention of small hiatal hernia.  I explained that this is very common and typically not of concern  Pt notes he woke up very early this am feeling nauseated but this passed.  He took something for nausea and felt better -right now he feels fine.  He thinks this may be due to Diflucan  He is still using oxygen all the time - he may take it off briefly to go upstairs and get dressed- may drop to the upper 80s after 10- 15 minutes of being off oxygen He would like to get off of oxygen permanently but we are not sure if this will be  possible  He finished his pulmonary rehab - he is now just doing [facility] at the [facility]  His last pulmonology visit was in May of last year  Lab Results  Component Value Date   HGBA1C 6.7 (H) 07/31/2020    Patient Active Problem List   Diagnosis Date Noted   Pulmonary emphysema ([place]) 04/26/2020   COVID-19 02/25/2020   Shingles 11/14/2019   Raynaud's syndrome 11/14/2019   Skin cancer 11/14/2019   Left inguinal hernia s/p lap repair w mesh 05/06/2019 05/06/2019   Recurrent right inguinal hernia s/p lap repair w mesh 05/06/2019 03/01/2019   Diabetes mellitus ([place]) 03/01/2019   Pneumonia 04/21/2014   COPD with acute exacerbation ([place]) 04/21/2014    Past Medical History:  Diagnosis Date   Anal fissure    COPD (chronic obstructive pulmonary disease) ([place])    Diverticulosis    DM type 2 (diabetes mellitus, type 2) ([place])    GERD (gastroesophageal reflux disease)    uses prilosec    Hypertension    Pneumonia 2018   Raynaud's disease    Shortness of breath dyspnea    WITH EXERTION   Sinusitis    Tremors of nervous system     Past Surgical History:  Procedure Laterality Date   COLONOSCOPY  03/27/2005   ESOPHAGOGASTRODUODENOSCOPY (EGD) WITH PROPOFOL N/A 02/08/2021   Procedure: ESOPHAGOGASTRODUODENOSCOPY (EGD) WITH PROPOFOL;  Surgeon: Milus Banister, MD;  Location: [facility];  Service: Endoscopy;  Laterality: N/A;   HERNIA REPAIR     INGUINAL HERNIA REPAIR N/A 05/06/2019   Procedure: LAPAROSCOPIC RIGHT AND LEFT INGUINAL HERNIA(S) REPAIR;  Surgeon: Michael Boston, MD;  Location: [facility];  Service: General;  Laterality: N/A;    Social History   Tobacco Use   Smoking status: Former    Packs/day: 1.00    Years: 45.00    Pack years: 45.00    Types: Cigarettes    Quit date: 01/21/1994    Years since quitting: 27.0   Smokeless tobacco: Never  Substance Use Topics   Alcohol use: Yes    Comment: a drink daily with dinner   Drug use: No    Family History   Problem Relation Age of Onset   Dementia Brother    Diabetes Brother    Heart attack Father    Dementia Father     No Known Allergies  Medication list has been reviewed and updated.  Current Outpatient Medications on File Prior to Visit  Medication Sig Dispense Refill   amLODipine (NORVASC) 5 MG tablet Take 1 tablet (5 mg total) by mouth daily. 90 tablet 3   aspirin EC 81 MG tablet Take 81 mg by mouth daily.     Cyanocobalamin (B-12) 1000 MCG CAPS Take 1 capsule by mouth daily.     Flaxseed, Linseed, (FLAXSEED OIL) 1200 MG CAPS Take 1 capsule by mouth daily.     fluconazole (DIFLUCAN) 50 MG tablet Take 2 tablets (100 mg total) by mouth daily for 10 days. 20 tablet 0   Fluticasone-Umeclidin-Vilant (TRELEGY ELLIPTA) 100-62.5-25 MCG/INH AEPB INHALE 1 PUFF INTO THE LUNGS DAILY. 180 each 3   furosemide (LASIX) 20 MG tablet Take 1 tablet (20 mg total) by mouth daily as needed for edema. 30 tablet 0   Ginkgo Biloba EXTR Take 1 tablet by mouth 2 (two) times daily.     ketoconazole (NIZORAL) 2 % shampoo APPLY TO AFFECTED AREA(S) TOPICALLY TWO TIMES PER WEEK (Patient taking differently: 1 application See admin instructions. Take every time he takes a shower) 120 mL 0   loratadine (CLARITIN) 10 MG tablet TAKE 1 TABLET EVERY DAY 90 tablet 1   meloxicam (MOBIC) 15 MG tablet TAKE 1 TABLET EVERY DAY AS NEEDED FOR JOINT PAIN (Patient taking differently: Take 15 mg by mouth daily.) 90 tablet 0   metFORMIN (GLUCOPHAGE) 500 MG tablet Take 0.5 tablets (250 mg total) by mouth daily. 90 tablet 3   Misc Natural Products (HIMALAYAN GOJI PO) Take 1 tablet by mouth in the morning and at bedtime.      Multiple Vitamin (MULTIVITAMIN WITH MINERALS) TABS tablet Take 1 tablet by mouth daily.     omeprazole (PRILOSEC) 20 MG capsule Take 1 capsule (20 mg total) by mouth 2 (two) times daily. 180 capsule 3   OXYGEN Inhale 2 L into the lungs continuous.     pravastatin (PRAVACHOL) 40 MG tablet Take 0.5 tablets (20 mg  total) by mouth daily. 45 tablet 3   primidone (MYSOLINE) 50 MG tablet Take 1 tablet (50 mg total) by mouth 2 (two) times daily. 180 tablet 3   sildenafil (VIAGRA) 50 MG tablet TAKE 1 TABLET BY MOUTH DAILY AS NEEDED FOR ERECTILE DYSFUNCTION. 10 tablet 3   terazosin (HYTRIN) 1 MG capsule Take 1 capsule (1 mg total) by mouth 2 (two) times daily. 180 capsule 3   Tetrahydrozoline HCl (VISINE OP) Place 1  drop into both eyes 2 (two) times daily.     triamcinolone cream (KENALOG) 0.1 % APPLY 1 APPLICATION TOPICALLY 2 (TWO) TIMES DAILY. 80 g 0   Zoster Vaccine Adjuvanted [facility]) injection Inject into the muscle. 1 each 1   No current facility-administered medications on file prior to visit.    Review of Systems:  As per HPI- otherwise negative.   Physical Examination: Vitals:   02/12/21 1121  BP: 122/74  Pulse: 77  Resp: 18  SpO2: 94%   Vitals:   02/12/21 1121  Weight: 153 lb (69.4 kg)  Height: 5\' 9"  (1.753 m)   Body mass index is 22.59 kg/m. Ideal Body Weight: Weight in (lb) to have BMI = 25: 168.9  GEN: no acute distress.  Older gentleman who looks well, normal weight. HEENT: Atraumatic, Normocephalic.  Ears and Nose: No external deformity. CV: RRR, No M/G/R. No JVD. No thrill. No extra heart sounds. PULM: CTA B, no wheezes, crackles, rhonchi. No retractions. No resp. distress. No accessory muscle use. ABD: S, NT, ND EXTR: No c/c/e PSYCH: Normally interactive. Conversant.  Still using oxygen - he is using 2 liters Foot exam done today   Assessment and Plan: Type 2 diabetes mellitus without complication, without long-term current use of insulin (HCC) - Plan: Comprehensive metabolic panel, Hemoglobin A1c  Interstitial pulmonary disease (HCC)  Pulmonary fibrosis (HCC)  Essential hypertension - Plan: CBC, Comprehensive metabolic panel  Trigger ring finger of right hand - Plan: Ambulatory referral to Orthopedic Surgery  Oxygen dependent  Hiatal hernia  Follow-up on  diabetes today, labs pending as above Placed new referral to [facility] health orthopedics for expediency-trigger finger Discussed his oxygen use.  I reached out to pulmonology and asked them to schedule patient for an appointment  Discussed hiatal hernia today and reassured patient  Blood pressure under good control, continue current medication  Addendum 02/28/2021 Patient was seen in the office on 02/12/2021 He is using oxygen due to interstitial pulmonary disease and pulmonary fibrosis, oxygen dependence which developed after he had COVID-19 in February 2022 As above, he continues to use oxygen at home with desaturations to under 90% when he temporarily takes oxygen off for example to get dressed.  Otherwise he is using at least 2 L of oxygen 24 hours a day I do recommend that he continue using oxygen due to dangerous desaturation when he stops oxygen use  Signed Lamar Blinks, MD   Received labs as below, message to patient Results for orders placed or performed in visit on 02/12/21  CBC  Result Value Ref Range   WBC 5.7 4.0 - 10.5 K/uL   RBC 4.24 4.22 - 5.81 Mil/uL   Platelets 167.0 150.0 - 400.0 K/uL   Hemoglobin 13.5 13.0 - 17.0 g/dL   HCT 40.0 39.0 - 52.0 %   MCV 94.2 78.0 - 100.0 fl   MCHC 33.8 30.0 - 36.0 g/dL   RDW 14.5 11.5 - 15.5 %  Comprehensive metabolic panel  Result Value Ref Range   Sodium 140 135 - 145 mEq/L   Potassium 4.5 3.5 - 5.1 mEq/L   Chloride 103 96 - 112 mEq/L   CO2 29 19 - 32 mEq/L   Glucose, Bld 100 (H) 70 - 99 mg/dL   BUN 20 6 - 23 mg/dL   Creatinine, Ser 1.17 0.40 - 1.50 mg/dL   Total Bilirubin 0.4 0.2 - 1.2 mg/dL   Alkaline Phosphatase 75 39 - 117 U/L   AST 18 0 - 37 U/L  ALT 13 0 - 53 U/L   Total Protein 7.6 6.0 - 8.3 g/dL   Albumin 4.2 3.5 - 5.2 g/dL   GFR 56.51 (L) >60.00 mL/min   Calcium 9.2 8.4 - 10.5 mg/dL  Hemoglobin A1c  Result Value Ref Range   Hgb A1c MFr Bld 6.5 4.6 - 6.5 %

## 2021-02-06 NOTE — Telephone Encounter (Signed)
Pt last saw Colleen please send to [facility]

## 2021-02-06 NOTE — Telephone Encounter (Signed)
Patient called and stated that he would like to speak with you. Patient has Endo on Thursday at [facility]. Stated that he called over there and they told him to call us. Please advise.

## 2021-02-07 NOTE — Anesthesia Preprocedure Evaluation (Addendum)
Anesthesia Evaluation  Patient identified by MRN, date of birth, ID band Patient awake    Reviewed: Allergy & Precautions, NPO status , Patient's Chart, lab work & pertinent test results  History of Anesthesia Complications Negative for: history of anesthetic complications  Airway Mallampati: I  TM Distance: >3 FB Neck ROM: Full    Dental  (+) Upper Dentures, Lower Dentures, Dental Advisory Given   Pulmonary COPD,  COPD inhaler and oxygen dependent, former smoker,    Pulmonary exam normal        Cardiovascular hypertension, Pt. on medications Normal cardiovascular exam  ECG: NSR, rate 79  Myocardial Perfusion: Nuclear stress EF: 50%. Visually, the AF appears to be greater than the 50% gererated by the computer calculations There was no ST segment deviation noted during stress. The study is normal. This is a low risk study.     Neuro/Psych negative neurological ROS  negative psych ROS   GI/Hepatic Neg liver ROS, GERD  Medicated and Controlled,  Endo/Other  diabetes, Oral Hypoglycemic AgentsPre-DM  Renal/GU negative Renal ROS     Musculoskeletal negative musculoskeletal ROS (+)   Abdominal   Peds  Hematology HLD   Anesthesia Other Findings   Reproductive/Obstetrics                            Anesthesia Physical  Anesthesia Plan  ASA: 3  Anesthesia Plan: MAC   Post-op Pain Management:    Induction:   PONV Risk Score and Plan: 2 and Ondansetron and Propofol infusion  Airway Management Planned: Natural Airway  Additional Equipment:   Intra-op Plan:   Post-operative Plan:   Informed Consent: I have reviewed the patients History and Physical, chart, labs and discussed the procedure including the risks, benefits and alternatives for the proposed anesthesia with the patient or authorized representative who has indicated his/her understanding and acceptance.     Dental  advisory given  Plan Discussed with: Anesthesiologist and CRNA  Anesthesia Plan Comments:        Anesthesia Quick Evaluation

## 2021-02-08 ENCOUNTER — Encounter (HOSPITAL_COMMUNITY): Payer: Self-pay | Admitting: Gastroenterology

## 2021-02-08 ENCOUNTER — Ambulatory Visit (HOSPITAL_COMMUNITY): Payer: Medicare Other | Admitting: Anesthesiology

## 2021-02-08 ENCOUNTER — Other Ambulatory Visit: Payer: Self-pay

## 2021-02-08 ENCOUNTER — Ambulatory Visit (HOSPITAL_COMMUNITY)
Admission: RE | Admit: 2021-02-08 | Discharge: 2021-02-08 | Disposition: A | Discharge status: Home / Self Care | Payer: Medicare Other | Attending: Gastroenterology | Admitting: Gastroenterology

## 2021-02-08 ENCOUNTER — Encounter (HOSPITAL_COMMUNITY)
Admission: RE | Disposition: A | Discharge status: Home / Self Care | Payer: Self-pay | Source: Home / Self Care | Attending: Gastroenterology

## 2021-02-08 DIAGNOSIS — K449 Diaphragmatic hernia without obstruction or gangrene: Secondary | ICD-10-CM | POA: Diagnosis not present

## 2021-02-08 DIAGNOSIS — E119 Type 2 diabetes mellitus without complications: Secondary | ICD-10-CM | POA: Insufficient documentation

## 2021-02-08 DIAGNOSIS — Z87891 Personal history of nicotine dependence: Secondary | ICD-10-CM | POA: Diagnosis not present

## 2021-02-08 DIAGNOSIS — Z9981 Dependence on supplemental oxygen: Secondary | ICD-10-CM | POA: Diagnosis not present

## 2021-02-08 DIAGNOSIS — J449 Chronic obstructive pulmonary disease, unspecified: Secondary | ICD-10-CM | POA: Diagnosis not present

## 2021-02-08 DIAGNOSIS — Z7984 Long term (current) use of oral hypoglycemic drugs: Secondary | ICD-10-CM | POA: Diagnosis not present

## 2021-02-08 DIAGNOSIS — K219 Gastro-esophageal reflux disease without esophagitis: Secondary | ICD-10-CM | POA: Insufficient documentation

## 2021-02-08 DIAGNOSIS — R1310 Dysphagia, unspecified: Secondary | ICD-10-CM | POA: Diagnosis not present

## 2021-02-08 DIAGNOSIS — Z79899 Other long term (current) drug therapy: Secondary | ICD-10-CM | POA: Insufficient documentation

## 2021-02-08 DIAGNOSIS — I10 Essential (primary) hypertension: Secondary | ICD-10-CM | POA: Diagnosis not present

## 2021-02-08 DIAGNOSIS — B3781 Candidal esophagitis: Secondary | ICD-10-CM | POA: Diagnosis not present

## 2021-02-08 DIAGNOSIS — E785 Hyperlipidemia, unspecified: Secondary | ICD-10-CM | POA: Insufficient documentation

## 2021-02-08 HISTORY — PX: ESOPHAGOGASTRODUODENOSCOPY (EGD) WITH PROPOFOL: SHX5813

## 2021-02-08 LAB — GLUCOSE, CAPILLARY: Glucose-Capillary: 117 mg/dL — ABNORMAL HIGH (ref 70–99)

## 2021-02-08 SURGERY — ESOPHAGOGASTRODUODENOSCOPY (EGD) WITH PROPOFOL
Anesthesia: Monitor Anesthesia Care

## 2021-02-08 MED ORDER — SODIUM CHLORIDE 0.9 % IV SOLN: INTRAVENOUS | Status: DC

## 2021-02-08 MED ORDER — LIDOCAINE 2% (20 MG/ML) 5 ML SYRINGE
INTRAMUSCULAR | Status: DC | PRN
  Administered 2021-02-08: 40 mg via INTRAVENOUS

## 2021-02-08 MED ORDER — PROPOFOL 500 MG/50ML IV EMUL
INTRAVENOUS | Status: DC | PRN
  Administered 2021-02-08: 60 ug/kg/min via INTRAVENOUS

## 2021-02-08 MED ORDER — LACTATED RINGERS IV SOLN: INTRAVENOUS | Status: DC | PRN

## 2021-02-08 MED ORDER — PROPOFOL 10 MG/ML IV BOLUS
INTRAVENOUS | Status: DC | PRN
  Administered 2021-02-08: 20 mg via INTRAVENOUS
  Administered 2021-02-08: 30 mg via INTRAVENOUS

## 2021-02-08 MED ORDER — FLUCONAZOLE 50 MG PO TABS
100.0000 mg | ORAL_TABLET | Freq: Every day | ORAL | 0 refills | Status: AC
Start: 2021-02-08 — End: 2021-02-18

## 2021-02-08 SURGICAL SUPPLY — 15 items

## 2021-02-08 NOTE — Transfer of Care (Signed)
Immediate Anesthesia Transfer of Care Note  Patient: Johnathan [name]  Procedure(s) Performed: ESOPHAGOGASTRODUODENOSCOPY (EGD) WITH PROPOFOL  Patient Location: PACU  Anesthesia Type:MAC  Level of Consciousness: awake, alert , oriented and patient cooperative  Airway & Oxygen Therapy: Patient Spontanous Breathing and Patient connected to face mask oxygen  Post-op Assessment: Report given to RN and Post -op Vital signs reviewed and stable  Post vital signs: Reviewed and stable  Last Vitals:  Vitals Value Taken Time  BP    Temp    Pulse    Resp    SpO2      Last Pain:  Vitals:   02/08/21 1011  TempSrc: Oral  PainSc: 0-No pain      Patients Stated Pain Goal: 2 (72/53/66 4403)  Complications: No notable events documented.

## 2021-02-08 NOTE — H&P (Signed)
HPI: This is a man with dysphagia, abnormal esophagus on GUI   ROS: complete GI ROS as described in HPI, all other review negative.  Constitutional:  No unintentional weight loss   Past Medical History:  Diagnosis Date   Anal fissure    COPD (chronic obstructive pulmonary disease) ([place])    Diverticulosis    DM type 2 (diabetes mellitus, type 2) ([facility])    GERD (gastroesophageal reflux disease)    uses prilosec    Hypertension    Pneumonia 2018   Raynaud's disease    Shortness of breath dyspnea    WITH EXERTION   Sinusitis    Tremors of nervous system     Past Surgical History:  Procedure Laterality Date   COLONOSCOPY  03/27/2005   HERNIA REPAIR     INGUINAL HERNIA REPAIR N/A 05/06/2019   Procedure: LAPAROSCOPIC RIGHT AND LEFT INGUINAL HERNIA(S) REPAIR;  Surgeon: Michael Boston, MD;  Location: [facility];  Service: General;  Laterality: N/A;    No current facility-administered medications for this encounter.    Allergies as of 01/03/2021   (No Known Allergies)    Family History  Problem Relation Age of Onset   Dementia Brother    Diabetes Brother    Heart attack Father    Dementia Father     Social History   Socioeconomic History   Marital status: Single    Spouse name: Not on file   Number of children: Not on file   Years of education: Not on file   Highest education level: Not on file  Occupational History   Not on file  Tobacco Use   Smoking status: Former    Packs/day: 1.00    Years: 45.00    Pack years: 45.00    Types: Cigarettes    Quit date: 01/21/1994    Years since quitting: 27.0   Smokeless tobacco: Never  Vaping Use   Vaping Use: Not on file  Substance and Sexual Activity   Alcohol use: Yes    Comment: a drink daily with dinner   Drug use: No   Sexual activity: Not on file  Other Topics Concern   Not on file  Social History Narrative   Not on file   Social Determinants of Health   Financial Resource Strain: Low Risk     Difficulty of Paying Living Expenses: Not hard at all  Food Insecurity: No Food Insecurity   Worried About Charity fundraiser in the Last Year: Never true   [place] in the Last Year: Never true  Transportation Needs: No Transportation Needs   Lack of Transportation (Medical): No   Lack of Transportation (Non-Medical): No  Physical Activity: Inactive   Days of Exercise per Week: 0 days   Minutes of Exercise per Session: 0 min  Stress: No Stress Concern Present   Feeling of Stress : Not at all  Social Connections: Moderately Isolated   Frequency of Communication with Friends and Family: More than three times a week   Frequency of Social Gatherings with Friends and Family: Once a week   Attends Religious Services: 1 to 4 times per year   Active Member of [employer] or Organizations: No   Attends Archivist Meetings: Never   Marital Status: Never married  Human resources officer Violence: Not At Risk   Fear of Current or Ex-Partner: No   Emotionally Abused: No   Physically Abused: No   Sexually Abused: No  Physical Exam: BP (!) 157/72    Pulse 71    Temp 97.7 F (36.5 C) (Oral)    Resp 14    SpO2 97%  Constitutional: generally well-appearing Psychiatric: alert and oriented x3 Abdomen: soft, nontender, nondistended, no obvious ascites, no peritoneal signs, normal bowel sounds No peripheral edema noted in lower extremities  Assessment and plan: 88 y.o. male with dysphagia, abnormal esophagus on imaging  EGD today  Please see the "Patient Instructions" section for addition details about the plan.  Owens Loffler, MD [facility] 02/08/2021, 10:48 AM

## 2021-02-08 NOTE — Op Note (Signed)
[facility] Patient Name: Johnathan [name] Procedure Date: 02/08/2021 MRN: [id number] Attending MD: Milus Banister , MD Date of Birth: 1932/06/08 CSN: [id number] Age: 88 Admit Type: Outpatient Procedure:                Upper GI endoscopy Indications:              Dysphagia, abnormal very proximal esophagus (vs.                            most distal pharynx) on recent barium esophagram Providers:                Milus Banister, MD, Jeanella Cara, RN,                            Tyna Jaksch Technician, Despina Pole,                            Technician, Cleda Daub, CRNA Referring MD:              Medicines:                Monitored Anesthesia Care Complications:            No immediate complications. Estimated blood loss:                            None. Estimated Blood Loss:     Estimated blood loss: none. Procedure:                Pre-Anesthesia Assessment:                           - Prior to the procedure, a History and Physical                            was performed, and patient medications and                            allergies were reviewed. The patient's tolerance of                            previous anesthesia was also reviewed. The risks                            and benefits of the procedure and the sedation                            options and risks were discussed with the patient.                            All questions were answered, and informed consent                            was obtained. Prior Anticoagulants: The patient has  taken no previous anticoagulant or antiplatelet                            agents. ASA Grade Assessment: III - A patient with                            severe systemic disease. After reviewing the risks                            and benefits, the patient was deemed in                            satisfactory condition to undergo the procedure.                           After  obtaining informed consent, the endoscope was                            passed under direct vision. Throughout the                            procedure, the patient's blood pressure, pulse, and                            oxygen saturations were monitored continuously. The                            [id number]-H190 ([id number]) Olympus endoscope was introduced                            through the mouth, and advanced to the second part                            of duodenum. The upper GI endoscopy was                            accomplished without difficulty. The patient                            tolerated the procedure well. Scope In: Scope Out: Findings:      The posterior oropharynx was unusual appearing; asymmetric and there       seemed to be more than usual soft tissue just above the cords, see       images.      A few small white exudates were scattered throughout the esophagus.      A small hiatal hernia was present.      The exam was otherwise without abnormality. Impression:               - The posterior oropharynx was unusual appearing;                            asymmetric and there seemed to be more than usual  soft tissue just above the cords, see images. I am                            going to refer him to ENT for better evaluation.                           - A few small white exudates were scattered                            throughout the esophagus. This is likely candida                            infection and will treat with appropriate diflucan                           - Small hiatal hernia. Moderate Sedation:      Not Applicable - Patient had care per Anesthesia. Recommendation:           - Discharge patient to home.                           - ENT referral.                           - 10 day diflucan course. Procedure Code(s):        --- Professional ---                           [id number], Esophagogastroduodenoscopy, flexible,                             transoral; diagnostic, including collection of                            specimen(s) by brushing or washing, when performed                            (separate procedure) Diagnosis Code(s):        --- Professional ---                           K44.9, Diaphragmatic hernia without obstruction or                            gangrene                           R13.10, Dysphagia, unspecified CPT copyright 2019 American Medical Association. All rights reserved. The codes documented in this report are preliminary and upon coder review may  be revised to meet current compliance requirements. Milus Banister, MD 02/08/2021 11:43:57 AM This report has been signed electronically. Number of Addenda: 0

## 2021-02-08 NOTE — Anesthesia Postprocedure Evaluation (Signed)
Anesthesia Post Note  Patient: Johnathan [name]  Procedure(s) Performed: ESOPHAGOGASTRODUODENOSCOPY (EGD) WITH PROPOFOL     Patient location during evaluation: Endoscopy Anesthesia Type: MAC Level of consciousness: awake and alert Pain management: pain level controlled Vital Signs Assessment: post-procedure vital signs reviewed and stable Respiratory status: spontaneous breathing and respiratory function stable Cardiovascular status: stable Postop Assessment: no apparent nausea or vomiting Anesthetic complications: no   No notable events documented.  Last Vitals:  Vitals:   02/08/21 1155 02/08/21 1200  BP: (!) 149/68 140/70  Pulse: 79 67  Resp: 20 15  Temp:    SpO2: 100% 100%    Last Pain:  Vitals:   02/08/21 1139  TempSrc: Oral  PainSc: 0-No pain                 Villa Burgin DANIEL

## 2021-02-08 NOTE — Discharge Instructions (Addendum)
YOU HAD AN ENDOSCOPIC PROCEDURE TODAY: Refer to the procedure report and other information in the discharge instructions given to you for any specific questions about what was found during the examination. If this information does not answer your questions, please call [place] office at [phone] to clarify.  ° °YOU SHOULD EXPECT: Some feelings of bloating in the abdomen. Passage of more gas than usual. Walking can help get rid of the air that was put into your GI tract during the procedure and reduce the bloating. If you had a lower endoscopy (such as a colonoscopy or flexible sigmoidoscopy) you may notice spotting of blood in your stool or on the toilet paper. Some abdominal soreness may be present for a day or two, also. ° °DIET: Your first meal following the procedure should be a light meal and then it is ok to progress to your normal diet. A half-sandwich or bowl of soup is an example of a good first meal. Heavy or fried foods are harder to digest and may make you feel nauseous or bloated. Drink plenty of fluids but you should avoid alcoholic beverages for 24 hours. If you had a esophageal dilation, please see attached instructions for diet.   ° °ACTIVITY: Your care partner should take you home directly after the procedure. You should plan to take it easy, moving slowly for the rest of the day. You can resume normal activity the day after the procedure however YOU SHOULD NOT DRIVE, use power tools, machinery or perform tasks that involve climbing or major physical exertion for 24 hours (because of the sedation medicines used during the test).  ° °SYMPTOMS TO REPORT IMMEDIATELY: °A gastroenterologist can be reached at any hour. Please call [phone]  for any of the following symptoms:  °Following lower endoscopy (colonoscopy, flexible sigmoidoscopy) °Excessive amounts of blood in the stool  °Significant tenderness, worsening of abdominal pains  °Swelling of the abdomen that is new, acute  °Fever of 100° or  higher  °Following upper endoscopy (EGD, EUS, ERCP, esophageal dilation) °Vomiting of blood or coffee ground material  °New, significant abdominal pain  °New, significant chest pain or pain under the shoulder blades  °Painful or persistently difficult swallowing  °New shortness of breath  °Black, tarry-looking or red, bloody stools ° °FOLLOW UP:  °If any biopsies were taken you will be contacted by phone or by letter within the next 1-3 weeks. Call [phone]  if you have not heard about the biopsies in 3 weeks.  °Please also call with any specific questions about appointments or follow up tests. ° °

## 2021-02-09 ENCOUNTER — Encounter (HOSPITAL_COMMUNITY): Payer: Self-pay | Admitting: Gastroenterology

## 2021-02-12 ENCOUNTER — Encounter: Payer: Self-pay | Admitting: Family Medicine

## 2021-02-12 ENCOUNTER — Ambulatory Visit (INDEPENDENT_AMBULATORY_CARE_PROVIDER_SITE_OTHER): Payer: Medicare Other | Admitting: Family Medicine

## 2021-02-12 VITALS — BP 122/74 | HR 77 | Resp 18 | Ht 69.0 in | Wt 153.0 lb

## 2021-02-12 DIAGNOSIS — Z9981 Dependence on supplemental oxygen: Secondary | ICD-10-CM

## 2021-02-12 DIAGNOSIS — J849 Interstitial pulmonary disease, unspecified: Secondary | ICD-10-CM

## 2021-02-12 DIAGNOSIS — K449 Diaphragmatic hernia without obstruction or gangrene: Secondary | ICD-10-CM

## 2021-02-12 DIAGNOSIS — E119 Type 2 diabetes mellitus without complications: Secondary | ICD-10-CM | POA: Diagnosis not present

## 2021-02-12 DIAGNOSIS — I10 Essential (primary) hypertension: Secondary | ICD-10-CM

## 2021-02-12 DIAGNOSIS — J8410 Pulmonary fibrosis, unspecified: Secondary | ICD-10-CM

## 2021-02-12 DIAGNOSIS — M65341 Trigger finger, right ring finger: Secondary | ICD-10-CM | POA: Diagnosis not present

## 2021-02-12 LAB — CBC
HCT: 40 % (ref 39.0–52.0)
Hemoglobin: 13.5 g/dL (ref 13.0–17.0)
MCHC: 33.8 g/dL (ref 30.0–36.0)
MCV: 94.2 fl (ref 78.0–100.0)
Platelets: 167 10*3/uL (ref 150.0–400.0)
RBC: 4.24 Mil/uL (ref 4.22–5.81)
RDW: 14.5 % (ref 11.5–15.5)
WBC: 5.7 10*3/uL (ref 4.0–10.5)

## 2021-02-12 LAB — COMPREHENSIVE METABOLIC PANEL
ALT: 13 U/L (ref 0–53)
AST: 18 U/L (ref 0–37)
Albumin: 4.2 g/dL (ref 3.5–5.2)
Alkaline Phosphatase: 75 U/L (ref 39–117)
BUN: 20 mg/dL (ref 6–23)
CO2: 29 mEq/L (ref 19–32)
Calcium: 9.2 mg/dL (ref 8.4–10.5)
Chloride: 103 mEq/L (ref 96–112)
Creatinine, Ser: 1.17 mg/dL (ref 0.40–1.50)
GFR: 56.51 mL/min — ABNORMAL LOW (ref >60.00)
Glucose, Bld: 100 mg/dL — ABNORMAL HIGH (ref 70–99)
Potassium: 4.5 mEq/L (ref 3.5–5.1)
Sodium: 140 mEq/L (ref 135–145)
Total Bilirubin: 0.4 mg/dL (ref 0.2–1.2)
Total Protein: 7.6 g/dL (ref 6.0–8.3)

## 2021-02-12 LAB — HEMOGLOBIN A1C: Hgb A1c MFr Bld: 6.5 % (ref 4.6–6.5)

## 2021-02-12 NOTE — Patient Instructions (Addendum)
I have sent a message to your pulmonologist- Dr [name]- asking him to bring you in for a visit We will also get you seen by Dr Tempie Donning with hand surgery asap- feel free to call them I will be in touch with your labs asap

## 2021-02-13 ENCOUNTER — Other Ambulatory Visit: Payer: Self-pay

## 2021-02-13 ENCOUNTER — Encounter: Payer: Self-pay | Admitting: Orthopedic Surgery

## 2021-02-13 ENCOUNTER — Telehealth: Payer: Self-pay

## 2021-02-13 ENCOUNTER — Ambulatory Visit (INDEPENDENT_AMBULATORY_CARE_PROVIDER_SITE_OTHER): Payer: Medicare Other | Admitting: Orthopedic Surgery

## 2021-02-13 DIAGNOSIS — M65341 Trigger finger, right ring finger: Secondary | ICD-10-CM | POA: Insufficient documentation

## 2021-02-13 MED ORDER — LIDOCAINE HCL 1 % IJ SOLN
1.0000 mL | INTRAMUSCULAR | Status: AC | PRN
  Administered 2021-02-13: 16:00:00 1 mL

## 2021-02-13 MED ORDER — BETAMETHASONE SOD PHOS & ACET 6 (3-3) MG/ML IJ SUSP
6.0000 mg | INTRAMUSCULAR | Status: AC | PRN
  Administered 2021-02-13: 16:00:00 6 mg via INTRA_ARTICULAR

## 2021-02-13 NOTE — Telephone Encounter (Signed)
Spoke with representative who advised that records and demographics be went to their office, and they will notify patient of appointment date and time.  Patient aware someone from Dr Deeann Saint office will contact him with the appointment date and time.  He will contact me if he has not heard anything from their office in 1 to 2 weeks.  Patient agreed to plan and verbalized understanding.  No further questions.

## 2021-02-13 NOTE — Telephone Encounter (Signed)
-----   Message from Milus Banister, MD sent at 02/08/2021 11:46 AM EST ----- Johnathan [name], He needs ENT referral for dyspahgia, abnormal barium esophagram and abnormal posterior oropharynx on today's EGD. Send copies of my report  Thanks

## 2021-02-13 NOTE — Progress Notes (Signed)
Office Visit Note   Patient: Johnathan [name]           Date of Birth: January 24, 1932           MRN: [id number] Visit Date: 02/13/2021              Requested by: Darreld Mclean, MD [address] [place],  [place] [address] PCP: Darreld Mclean, MD   Assessment & Plan: Visit Diagnoses:  1. Trigger finger, right ring finger     Plan: We discussed the diagnosis, prognosis, non-operative and operative treatment options for trigger finger.  After our discussion, the patient would like to proceed with corticosteroid injection into the right ring finger.  We reviewed the risks and benefits of conservative management.  The patient expressed understanding of the reasoning and strategy going forward.  All patient questions and concerns were addressed.    Follow-Up Instructions: No follow-ups on file.   Orders:  No orders of the defined types were placed in this encounter.  No orders of the defined types were placed in this encounter.     Procedures: Hand/UE Inj: R ring A1 for trigger finger on 02/13/2021 3:39 PM Indications: tendon swelling and therapeutic Details: 25 G needle, volar approach Medications: 1 mL lidocaine 1 %; 6 mg betamethasone acetate-betamethasone sodium phosphate 6 (3-3) MG/ML Outcome: tolerated well, no immediate complications Procedure, treatment alternatives, risks and benefits explained, specific risks discussed. Patient was prepped and draped in the usual sterile fashion.      Clinical Data: No additional findings.   Subjective: Chief Complaint  Patient presents with   Right Ring Finger - Pain    RIGHT Handed, NKI, Pain:0/10, ring finger locks up when he closes hand, at times it is painful, other times its not    This is an 88 year old right-hand-dominant male who presents with triggering of the right ring finger for the last 3 to 4 months.  This is occasionally painful but quite bothersome for him.  He previously had a corticosteroid  injection into the right index finger some years ago with complete symptom resolution.  He never had any injections into this right ring finger.   Review of Systems   Objective: Vital Signs: BP (!) 144/70 (BP Location: Left Arm, Patient Position: Sitting)    Pulse 76    Ht 5\' 9"  (1.753 m)    Wt 153 lb (69.4 kg)    BMI 22.59 kg/m   Physical Exam Constitutional:      Appearance: Normal appearance.  Cardiovascular:     Rate and Rhythm: Normal rate.  Pulmonary:     Effort: Pulmonary effort is normal.     Comments: Breathing with supplemental O2 via nasal canula.  Skin:    General: Skin is warm and dry.     Capillary Refill: Capillary refill takes less than 2 seconds.  Neurological:     Mental Status: He is alert.    Right Hand Exam   Tenderness  Right hand tenderness location: TTP at ring finger A1 pulley.  Other  Erythema: absent Sensation: normal Pulse: present  Comments:  Palpable and visible triggering of right ring finger.      Specialty Comments:  No specialty comments available.  Imaging: No results found.   PMFS History: Patient Active Problem List   Diagnosis Date Noted   Trigger finger, right ring finger 02/13/2021   Pulmonary emphysema ([place]) 04/26/2020   COVID-19 02/25/2020   Shingles 11/14/2019   Raynaud's syndrome  11/14/2019   Skin cancer 11/14/2019   Left inguinal hernia s/p lap repair w mesh 05/06/2019 05/06/2019   Recurrent right inguinal hernia s/p lap repair w mesh 05/06/2019 03/01/2019   Diabetes mellitus ([place]) 03/01/2019   Pneumonia 04/21/2014   COPD with acute exacerbation ([place]) 04/21/2014   Past Medical History:  Diagnosis Date   Anal fissure    COPD (chronic obstructive pulmonary disease) ([place])    Diverticulosis    DM type 2 (diabetes mellitus, type 2) ([facility])    GERD (gastroesophageal reflux disease)    uses prilosec    Hypertension    Pneumonia 2018   Raynaud's disease    Shortness of breath dyspnea    WITH EXERTION    Sinusitis    Tremors of nervous system     Family History  Problem Relation Age of Onset   Dementia Brother    Diabetes Brother    Heart attack Father    Dementia Father     Past Surgical History:  Procedure Laterality Date   COLONOSCOPY  03/27/2005   ESOPHAGOGASTRODUODENOSCOPY (EGD) WITH PROPOFOL N/A 02/08/2021   Procedure: ESOPHAGOGASTRODUODENOSCOPY (EGD) WITH PROPOFOL;  Surgeon: Milus Banister, MD;  Location: [facility];  Service: Endoscopy;  Laterality: N/A;   HERNIA REPAIR     INGUINAL HERNIA REPAIR N/A 05/06/2019   Procedure: LAPAROSCOPIC RIGHT AND LEFT INGUINAL HERNIA(S) REPAIR;  Surgeon: Michael Boston, MD;  Location: [facility];  Service: General;  Laterality: N/A;   Social History   Occupational History   Not on file  Tobacco Use   Smoking status: Former    Packs/day: 1.00    Years: 45.00    Pack years: 45.00    Types: Cigarettes    Quit date: 01/21/1994    Years since quitting: 27.0   Smokeless tobacco: Never  Vaping Use   Vaping Use: Not on file  Substance and Sexual Activity   Alcohol use: Yes    Comment: a drink daily with dinner   Drug use: No   Sexual activity: Not on file

## 2021-02-15 NOTE — Telephone Encounter (Signed)
Dr. Deeann Saint office called to advise they do not see patient with this kind of conditions. Requested we redirect referral.

## 2021-02-16 NOTE — Telephone Encounter (Signed)
Patient aware of referral sent to [facility].  Patient is scheduled to see Dr Constance Holster on 03-15-21 at 9:40am.  Patient agreed to plan and verbalized understanding.  No further questions.

## 2021-02-21 DIAGNOSIS — U071 COVID-19: Secondary | ICD-10-CM | POA: Diagnosis not present

## 2021-02-23 ENCOUNTER — Ambulatory Visit (INDEPENDENT_AMBULATORY_CARE_PROVIDER_SITE_OTHER): Payer: Medicare Other | Admitting: Pulmonary Disease

## 2021-02-23 ENCOUNTER — Other Ambulatory Visit: Payer: Self-pay

## 2021-02-23 ENCOUNTER — Encounter: Payer: Self-pay | Admitting: Pulmonary Disease

## 2021-02-23 VITALS — BP 140/66 | HR 86 | Temp 97.9°F | Ht 69.0 in | Wt 159.4 lb

## 2021-02-23 DIAGNOSIS — J449 Chronic obstructive pulmonary disease, unspecified: Secondary | ICD-10-CM

## 2021-02-23 DIAGNOSIS — U099 Post covid-19 condition, unspecified: Secondary | ICD-10-CM

## 2021-02-23 DIAGNOSIS — J849 Interstitial pulmonary disease, unspecified: Secondary | ICD-10-CM | POA: Diagnosis not present

## 2021-02-23 MED ORDER — PREDNISONE 20 MG PO TABS: ORAL_TABLET | ORAL | 0 refills | Status: DC

## 2021-02-23 NOTE — Progress Notes (Signed)
Johnathan [name]    [id number]    November 22, 1932  Primary Care Physician:Copland, Gay Filler, MD  Referring Physician: Darreld Mclean, [address] [place],  [place] [address]  Chief complaint: Follow-up for asthma, COPD, post COVID-43  HPI: 88 year old ex-smoker with history of emphysema [no PFTs on record], Raynaud's syndrome Maintained on Symbicort for many years  Hospitalized for COVID-19 in early February 2022 in spite of getting the booster vaccine. He was treated with IV remdesivir, steroids, discharged on supplemental oxygen Post discharge he continues to have persistent dyspnea on exertion, hypoxia whenever he stops oxygen.  Follow-up chest x-ray shows persistent lung infiltrate and he has been referred to pulmonary for further evaluation Advair changed to Trelegy in 2021.  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  No feather pillows or comforter. Smoking history: 45-pack-year smoker.  Quit in the 1990s Travel history: No significant travel history Relevant family history: Father had COPD.  He was a smoker.  Interim history: Recently had an EGD by Dr. Ardis Hughs with findings of soft tissue about the vocal cords for which she has been referred to ENT.  He also had esophageal candidiasis and started on Diflucan  Continues on Trelegy inhaler Reports increasing cough, wheezing for the past 1 week  Outpatient Encounter Medications as of 02/23/2021  Medication Sig   amLODipine (NORVASC) 5 MG tablet Take 1 tablet (5 mg total) by mouth daily.   aspirin EC 81 MG tablet Take 81 mg by mouth daily.   Cyanocobalamin (B-12) 1000 MCG CAPS Take 1 capsule by mouth daily.   Flaxseed, Linseed, (FLAXSEED OIL) 1200 MG CAPS Take 1 capsule by mouth daily.   Fluticasone-Umeclidin-Vilant (TRELEGY ELLIPTA) 100-62.5-25 MCG/INH AEPB INHALE 1 PUFF  INTO THE LUNGS DAILY.   furosemide (LASIX) 20 MG tablet Take 1 tablet (20 mg total) by mouth daily as needed for edema.   Ginkgo Biloba EXTR Take 1 tablet by mouth 2 (two) times daily.   ketoconazole (NIZORAL) 2 % shampoo APPLY TO AFFECTED AREA(S) TOPICALLY TWO TIMES PER WEEK (Patient taking differently: 1 application See admin instructions. Take every time he takes a shower)   loratadine (CLARITIN) 10 MG tablet TAKE 1 TABLET EVERY DAY   meloxicam (MOBIC) 15 MG tablet TAKE 1 TABLET EVERY DAY AS NEEDED FOR JOINT PAIN (Patient taking differently: Take 15 mg by mouth daily.)   metFORMIN (GLUCOPHAGE) 500 MG tablet Take 0.5 tablets (250 mg total) by mouth daily.   Misc Natural Products (HIMALAYAN GOJI PO) Take 1 tablet by mouth in the morning and at bedtime.    Multiple Vitamin (MULTIVITAMIN WITH MINERALS) TABS tablet Take 1 tablet by mouth daily.   omeprazole (PRILOSEC) 20 MG capsule Take 1 capsule (20 mg total) by mouth 2 (two) times daily.   OXYGEN Inhale 2 L into the lungs continuous.   pravastatin (PRAVACHOL) 40 MG tablet Take 0.5 tablets (20 mg total) by mouth daily.   primidone (MYSOLINE) 50 MG tablet Take 1 tablet (50 mg total) by mouth 2 (two) times daily.   sildenafil (VIAGRA) 50 MG tablet TAKE 1 TABLET BY MOUTH DAILY AS NEEDED FOR ERECTILE DYSFUNCTION.   terazosin (HYTRIN) 1 MG capsule Take 1 capsule (1 mg total) by mouth 2 (two) times daily.   Tetrahydrozoline HCl (VISINE OP) Place 1 drop into both eyes 2 (  two) times daily.   triamcinolone cream (KENALOG) 0.1 % APPLY 1 APPLICATION TOPICALLY 2 (TWO) TIMES DAILY.   Zoster Vaccine Adjuvanted [facility]) injection Inject into the muscle.   No facility-administered encounter medications on file as of 02/23/2021.   Physical Exam: Blood pressure 140/66, pulse 86, temperature 97.9 F (36.6 C), temperature source Oral, height 5\' 9"  (1.753 m), weight 159 lb 6.4 oz (72.3 kg), SpO2 94 %. Gen:      No acute distress HEENT:  EOMI, sclera  anicteric Neck:     No masses; no thyromegaly Lungs:    Clear to auscultation bilaterally; normal respiratory effort CV:         Regular rate and rhythm; no murmurs Abd:      + bowel sounds; soft, non-tender; no palpable masses, no distension Ext:    No edema; adequate peripheral perfusion Skin:      Warm and dry; no rash Neuro: alert and oriented x 3 Psych: normal mood and affect   Data Reviewed: Imaging: Chest x-ray 02/20/2020-hyperinflation, no acute process Chest x-ray 02/25/2020-emphysema, mild left base opacity Chest x-ray 03/30/2020-bilateral infiltrates. CT high-resolution 04/28/2020- emphysema, peripheral sublenticular groundglass densities, calcified granuloma.  Indeterminate pattern I have reviewed the images personally.  PFTs: 05/26/2020 FVC 2.41 [6%], FEV1 1.23 [48%], F/F 51, TLC 5.03 [73%], DLCO 7.92 [34%] Severe obstruction, severe diffusion defect, mild restriction  Labs: CBC 02/29/2020-WBC 5.3, eos 0% CBC 05/30/2020-WBC 5, eos 7.6%, absolute eosinophil count 380 IgE 04/26/2020-858  ANA, CCP, rheumatoid factor 04/26/2020- Negative  Assessment:  Asthma, COPD overlap syndrome PFTs reviewed with severe obstruction. Doing better with Trelegy instead of advair Continue supplemental oxygen.  He did desat on exertion today and will need to continue it for now though he is eager to get off oxygen  He is in mild exacerbation today with wheezing Prescribe prednisone 40 mg a day for 5 days  Post COVID-19 He had been asymptomatic and active prior to COVID-19 but now short of breath, requiring supplemental oxygen CT shows indeterminate pattern interstitial lung disease  Will benefit from exercise therapy and weight loss Finished pulmonary rehab Continue supplemental oxygen for now  Raynaud's syndrome He has history of Raynaud's syndrome and is taking sildenafil ANA is negative.  Plan/Recommendations: Continue Trelegy Prednisone 40 mg a day for 5 days Pulmonary  rehab  Marshell Garfinkel MD [facility] 02/23/2021, 11:28 AM  CC: Copland, Gay Filler, MD

## 2021-02-23 NOTE — Patient Instructions (Signed)
We will give prednisone 40 mg a day for 5 days Continue the inhaler Continue supplemental oxygen Return to clinic in 4 months

## 2021-02-23 NOTE — Addendum Note (Signed)
Addended by: Elton Sin on: 02/23/2021 11:54 AM   Modules accepted: Orders

## 2021-02-27 ENCOUNTER — Ambulatory Visit (INDEPENDENT_AMBULATORY_CARE_PROVIDER_SITE_OTHER): Payer: Medicare Other | Admitting: Family Medicine

## 2021-02-27 ENCOUNTER — Encounter: Payer: Self-pay | Admitting: Family Medicine

## 2021-02-27 VITALS — BP 109/51 | HR 96 | Temp 97.7°F | Ht 69.0 in | Wt 153.0 lb

## 2021-02-27 DIAGNOSIS — J441 Chronic obstructive pulmonary disease with (acute) exacerbation: Secondary | ICD-10-CM | POA: Diagnosis not present

## 2021-02-27 DIAGNOSIS — R051 Acute cough: Secondary | ICD-10-CM

## 2021-02-27 LAB — POC COVID19 BINAXNOW: SARS Coronavirus 2 Ag: NEGATIVE

## 2021-02-27 MED ORDER — GUAIFENESIN ER 600 MG PO TB12: 1200.0000 mg | ORAL_TABLET | Freq: Two times a day (BID) | ORAL | 2 refills | Status: DC

## 2021-02-27 MED ORDER — AMOXICILLIN-POT CLAVULANATE 875-125 MG PO TABS: 1.0000 | ORAL_TABLET | Freq: Two times a day (BID) | ORAL | 0 refills | Status: AC

## 2021-02-27 NOTE — Patient Instructions (Addendum)
Start antibiotics and mucinex. You can continue over-the-counter medicines as well.  If not improving after antibiotics, please follow-up with your  pulmonologist.  Continue supportive measures including rest, hydration, humidifier use, steam showers, warm compresses to sinuses, warm liquids with lemon and honey, and over-the-counter cough, cold, and analgesics as needed.

## 2021-02-27 NOTE — Progress Notes (Signed)
Acute Office Visit  Subjective:    Patient ID: Johnathan [name], male    DOB: 04-24-1932, 88 y.o.   MRN: [id number]  CC: cough   HPI Patient is in today for cough.  Patient reporting 1 to 2 weeks of respiratory symptoms consisting of productive cough, dyspnea on exertion, fatigue, weakness.  States his abdominal muscles are sore from all the coughing and he is having trouble getting the mucus to come up but he can hear it.  He saw his pulmonologist on 02/23/2021 and they suggested mild COPD exacerbation and started him on prednisone.  States he might feel a little bit of them improvement since then but still feels terrible.  He has been having to use his albuterol inhaler at least twice a day since this started.  He denies any fevers, GI/GU symptoms, chest pain, other URI symptoms.  He has not tested for COVID but would like STI testing today.  States he had COVID-pneumonia last year.      Past Medical History:  Diagnosis Date   Anal fissure    COPD (chronic obstructive pulmonary disease) ([place])    Diverticulosis    DM type 2 (diabetes mellitus, type 2) ([facility])    GERD (gastroesophageal reflux disease)    uses prilosec    Hypertension    Pneumonia 2018   Raynaud's disease    Shortness of breath dyspnea    WITH EXERTION   Sinusitis    Tremors of nervous system     Past Surgical History:  Procedure Laterality Date   COLONOSCOPY  03/27/2005   ESOPHAGOGASTRODUODENOSCOPY (EGD) WITH PROPOFOL N/A 02/08/2021   Procedure: ESOPHAGOGASTRODUODENOSCOPY (EGD) WITH PROPOFOL;  Surgeon: Milus Banister, MD;  Location: [facility];  Service: Endoscopy;  Laterality: N/A;   HERNIA REPAIR     INGUINAL HERNIA REPAIR N/A 05/06/2019   Procedure: LAPAROSCOPIC RIGHT AND LEFT INGUINAL HERNIA(S) REPAIR;  Surgeon: Michael Boston, MD;  Location: [facility];  Service: General;  Laterality: N/A;    Family History  Problem Relation Age of Onset   Dementia Brother    Diabetes Brother    Heart  attack Father    Dementia Father     Social History   Socioeconomic History   Marital status: Single    Spouse name: Not on file   Number of children: Not on file   Years of education: Not on file   Highest education level: Not on file  Occupational History   Not on file  Tobacco Use   Smoking status: Former    Packs/day: 1.00    Years: 45.00    Pack years: 45.00    Types: Cigarettes    Quit date: 01/21/1994    Years since quitting: 27.1   Smokeless tobacco: Never  Vaping Use   Vaping Use: Not on file  Substance and Sexual Activity   Alcohol use: Yes    Comment: a drink daily with dinner   Drug use: No   Sexual activity: Not on file  Other Topics Concern   Not on file  Social History Narrative   Not on file   Social Determinants of Health   Financial Resource Strain: Low Risk    Difficulty of Paying Living Expenses: Not hard at all  Food Insecurity: No Food Insecurity   Worried About Charity fundraiser in the Last Year: Never true   Ran Out of Food in the Last Year: Never true  Transportation Needs: No Transportation Needs  Lack of Transportation (Medical): No   Lack of Transportation (Non-Medical): No  Physical Activity: Inactive   Days of Exercise per Week: 0 days   Minutes of Exercise per Session: 0 min  Stress: No Stress Concern Present   Feeling of Stress : Not at all  Social Connections: Moderately Isolated   Frequency of Communication with Friends and Family: More than three times a week   Frequency of Social Gatherings with Friends and Family: Once a week   Attends Religious Services: 1 to 4 times per year   Active Member of [employer] or Organizations: No   Attends Music therapist: Never   Marital Status: Never married  Human resources officer Violence: Not At Risk   Fear of Current or Ex-Partner: No   Emotionally Abused: No   Physically Abused: No   Sexually Abused: No    Outpatient Medications Prior to Visit  Medication Sig Dispense Refill    amLODipine (NORVASC) 5 MG tablet Take 1 tablet (5 mg total) by mouth daily. 90 tablet 3   aspirin EC 81 MG tablet Take 81 mg by mouth daily.     Cyanocobalamin (B-12) 1000 MCG CAPS Take 1 capsule by mouth daily.     Flaxseed, Linseed, (FLAXSEED OIL) 1200 MG CAPS Take 1 capsule by mouth daily.     Fluticasone-Umeclidin-Vilant (TRELEGY ELLIPTA) 100-62.5-25 MCG/INH AEPB INHALE 1 PUFF INTO THE LUNGS DAILY. 180 each 3   furosemide (LASIX) 20 MG tablet Take 1 tablet (20 mg total) by mouth daily as needed for edema. 30 tablet 0   Ginkgo Biloba EXTR Take 1 tablet by mouth 2 (two) times daily.     ketoconazole (NIZORAL) 2 % shampoo APPLY TO AFFECTED AREA(S) TOPICALLY TWO TIMES PER WEEK (Patient taking differently: 1 application See admin instructions. Take every time he takes a shower) 120 mL 0   loratadine (CLARITIN) 10 MG tablet TAKE 1 TABLET EVERY DAY 90 tablet 1   meloxicam (MOBIC) 15 MG tablet TAKE 1 TABLET EVERY DAY AS NEEDED FOR JOINT PAIN (Patient taking differently: Take 15 mg by mouth daily.) 90 tablet 0   metFORMIN (GLUCOPHAGE) 500 MG tablet Take 0.5 tablets (250 mg total) by mouth daily. 90 tablet 3   Misc Natural Products (HIMALAYAN GOJI PO) Take 1 tablet by mouth in the morning and at bedtime.      Multiple Vitamin (MULTIVITAMIN WITH MINERALS) TABS tablet Take 1 tablet by mouth daily.     omeprazole (PRILOSEC) 20 MG capsule Take 1 capsule (20 mg total) by mouth 2 (two) times daily. 180 capsule 3   OXYGEN Inhale 2 L into the lungs continuous.     pravastatin (PRAVACHOL) 40 MG tablet Take 0.5 tablets (20 mg total) by mouth daily. 45 tablet 3   predniSONE (DELTASONE) 20 MG tablet Take 40mg  for 5 days 10 tablet 0   primidone (MYSOLINE) 50 MG tablet Take 1 tablet (50 mg total) by mouth 2 (two) times daily. 180 tablet 3   sildenafil (VIAGRA) 50 MG tablet TAKE 1 TABLET BY MOUTH DAILY AS NEEDED FOR ERECTILE DYSFUNCTION. 10 tablet 3   terazosin (HYTRIN) 1 MG capsule Take 1 capsule (1 mg total) by  mouth 2 (two) times daily. 180 capsule 3   Tetrahydrozoline HCl (VISINE OP) Place 1 drop into both eyes 2 (two) times daily.     triamcinolone cream (KENALOG) 0.1 % APPLY 1 APPLICATION TOPICALLY 2 (TWO) TIMES DAILY. 80 g 0   Zoster Vaccine Adjuvanted [facility]) injection Inject into the muscle.  1 each 1   No facility-administered medications prior to visit.    No Known Allergies  Review of Systems All review of systems negative except what is listed in the HPI     Objective:    Physical Exam Vitals reviewed.  Constitutional:      General: He is not in acute distress.    Appearance: Normal appearance.  HENT:     Head: Normocephalic and atraumatic.  Cardiovascular:     Rate and Rhythm: Normal rate and regular rhythm.  Pulmonary:     Effort: No respiratory distress.     Breath sounds: Rhonchi present. No wheezing.  Skin:    General: Skin is warm and dry.  Neurological:     General: No focal deficit present.     Mental Status: He is alert and oriented to person, place, and time. Mental status is at baseline.  Psychiatric:        Mood and Affect: Mood normal.        Behavior: Behavior normal.        Thought Content: Thought content normal.        Judgment: Judgment normal.    BP (!) 109/51    Pulse 96    Temp 97.7 F (36.5 C)    Ht 5\' 9"  (1.753 m)    Wt 153 lb (69.4 kg)    SpO2 90% Comment: 2L   BMI 22.59 kg/m  Wt Readings from Last 3 Encounters:  02/27/21 153 lb (69.4 kg)  02/23/21 159 lb 6.4 oz (72.3 kg)  02/13/21 153 lb (69.4 kg)    Health Maintenance Due  Topic Date Due   OPHTHALMOLOGY EXAM  Never done   COVID-19 Vaccine (4 - Booster for Pfizer series) 12/25/2020    There are no preventive care reminders to display for this patient.   Lab Results  Component Value Date   TSH 2.99 03/30/2020   Lab Results  Component Value Date   WBC 5.7 02/12/2021   HGB 13.5 02/12/2021   HCT 40.0 02/12/2021   MCV 94.2 02/12/2021   PLT 167.0 02/12/2021   Lab Results   Component Value Date   NA 140 02/12/2021   K 4.5 02/12/2021   CO2 29 02/12/2021   GLUCOSE 100 (H) 02/12/2021   BUN 20 02/12/2021   CREATININE 1.17 02/12/2021   BILITOT 0.4 02/12/2021   ALKPHOS 75 02/12/2021   AST 18 02/12/2021   ALT 13 02/12/2021   PROT 7.6 02/12/2021   ALBUMIN 4.2 02/12/2021   CALCIUM 9.2 02/12/2021   ANIONGAP 8 02/29/2020   GFR 56.51 (L) 02/12/2021   Lab Results  Component Value Date   CHOL 125 10/18/2019   Lab Results  Component Value Date   HDL 44 10/18/2019   Lab Results  Component Value Date   LDLCALC 67 10/18/2019   Lab Results  Component Value Date   TRIG 89 02/25/2020   Lab Results  Component Value Date   CHOLHDL 2.8 10/18/2019   Lab Results  Component Value Date   HGBA1C 6.5 02/12/2021       Assessment & Plan:   1. COPD exacerbation ([place]) 2. Acute cough COVID negative Start antibiotics and mucinex. You can continue over-the-counter medicines as well.  If not improving after antibiotics, please follow-up with your  pulmonologist.  Continue supportive measures including rest, hydration, humidifier use, steam showers, warm compresses to sinuses, warm liquids with lemon and honey, and over-the-counter cough, cold, and analgesics as needed.  Patient aware of  signs/symptoms requiring further/urgent evaluation.   - guaiFENesin (MUCINEX) 600 MG 12 hr tablet; Take 2 tablets (1,200 mg total) by mouth 2 (two) times daily.  Dispense: 30 tablet; Refill: 2 - amoxicillin-clavulanate (AUGMENTIN) 875-125 MG tablet; Take 1 tablet by mouth 2 (two) times daily for 10 days.  Dispense: 20 tablet; Refill: 0 - POC COVID-19 BinaxNow  Follow-up if symptoms worsen or fail to improve.   Terrilyn Saver, NP

## 2021-03-05 ENCOUNTER — Encounter: Payer: Self-pay | Admitting: Nurse Practitioner

## 2021-03-05 ENCOUNTER — Other Ambulatory Visit: Payer: Self-pay

## 2021-03-05 ENCOUNTER — Ambulatory Visit (INDEPENDENT_AMBULATORY_CARE_PROVIDER_SITE_OTHER): Payer: Medicare Other | Admitting: Nurse Practitioner

## 2021-03-05 ENCOUNTER — Ambulatory Visit (INDEPENDENT_AMBULATORY_CARE_PROVIDER_SITE_OTHER): Payer: Medicare Other

## 2021-03-05 ENCOUNTER — Telehealth: Payer: Self-pay | Admitting: Nurse Practitioner

## 2021-03-05 VITALS — BP 138/72 | HR 89 | Temp 98.7°F | Resp 18

## 2021-03-05 DIAGNOSIS — J449 Chronic obstructive pulmonary disease, unspecified: Secondary | ICD-10-CM

## 2021-03-05 DIAGNOSIS — R059 Cough, unspecified: Secondary | ICD-10-CM | POA: Diagnosis not present

## 2021-03-05 DIAGNOSIS — J439 Emphysema, unspecified: Secondary | ICD-10-CM | POA: Diagnosis not present

## 2021-03-05 DIAGNOSIS — B370 Candidal stomatitis: Secondary | ICD-10-CM

## 2021-03-05 DIAGNOSIS — J441 Chronic obstructive pulmonary disease with (acute) exacerbation: Secondary | ICD-10-CM

## 2021-03-05 DIAGNOSIS — J9611 Chronic respiratory failure with hypoxia: Secondary | ICD-10-CM

## 2021-03-05 DIAGNOSIS — J4489 Other specified chronic obstructive pulmonary disease: Secondary | ICD-10-CM

## 2021-03-05 MED ORDER — BENZONATATE 100 MG PO CAPS: 200.0000 mg | ORAL_CAPSULE | Freq: Three times a day (TID) | ORAL | 1 refills | Status: DC | PRN

## 2021-03-05 MED ORDER — AEROCHAMBER MV MISC: 0 refills | Status: DC

## 2021-03-05 MED ORDER — BREZTRI AEROSPHERE 160-9-4.8 MCG/ACT IN AERO: 2.0000 | INHALATION_SPRAY | Freq: Two times a day (BID) | RESPIRATORY_TRACT | 3 refills | Status: DC

## 2021-03-05 MED ORDER — NYSTATIN 100000 UNIT/ML MT SUSP: 5.0000 mL | Freq: Four times a day (QID) | OROMUCOSAL | 0 refills | Status: DC

## 2021-03-05 MED ORDER — PREDNISONE 10 MG PO TABS: ORAL_TABLET | ORAL | 0 refills | Status: DC

## 2021-03-05 MED ORDER — AZITHROMYCIN 250 MG PO TABS: ORAL_TABLET | ORAL | 0 refills | Status: DC

## 2021-03-05 MED ORDER — CHLORPHENIRAMINE MALEATE 4 MG PO TABS: 4.0000 mg | ORAL_TABLET | Freq: Every evening | ORAL | 0 refills | Status: DC | PRN

## 2021-03-05 NOTE — Assessment & Plan Note (Signed)
Significant thrush on exam. Nystatin oral rinses for 10 days. Switch from Trelegy to [facility] with spacer.

## 2021-03-05 NOTE — Telephone Encounter (Signed)
Called and spoke with patient. He stated that he needed a prescription for a spacer for his inhaler to be sent to [facility]. Advised him that I would go ahead and send in the RX for him.   Nothing further needed at time of call.

## 2021-03-05 NOTE — Progress Notes (Signed)
@Patient  ID: Johnathan [name], male    DOB: 1932/11/23, 88 y.o.   MRN: [id number]  Chief Complaint  Patient presents with   Follow-up    He reports that he has a dry cough that is been going on for about a week.     Referring provider: Darreld Mclean, MD  HPI: 88 year old male, former smoker (45 pack hx) followed for COPD with chronic bronchitis and emphysema, interstitial pulmonary disease, and chronic respiratory failure with hypoxia.  He is a patient Dr. Matilde Bash and was last seen in office on 02/23/2021.  Past medical history significant for hypertension, Raynaud's syndrome, sinusitis, GERD, DM 2  TEST/EVENTS:  02/04/2019 echocardiogram: EF 60 to 65%.  Trivial MR.  AR mild to moderate 04/28/2020 HRCT chest: Atherosclerosis.  Right and left pulmonary arteries are enlarged.  Centrilobular and paraseptal emphysema.  Peripheral and basilar predominant subpleural reticular densities and groundglass, asymmetric on the right.  Mild associated architectural distortion.  Calcified granuloma in the posterior left upper lobe.  Pulmonary parenchymal pattern of fibrosis may be postinfectious/postinflammatory in etiology.  Indeterminate for UIP. 05/26/2020 PFTs: FVC 2.48 (67), FEV1 1.28 (50), ratio 51, TLC 73%, DLCO 36%. No BD  02/23/2021: OV with Dr. [name].  Recent EGD by Dr. Ardis Hughs with findings of soft tissue about the vocal cords for which she has been referred to ENT and also noted to have esophageal candidiasis and started on Diflucan.  Treated for AECOPD at visit with short prednisone burst. Post COVID in Feb '22 with worsening SOB and oxygen requirements after - CT after shows indeterminate ILD. Continued supplemental O2 2 lpm   03/05/2021: Today - acute visit Patient presents today for persistent cough.  He was seen earlier this month and treated with a short burst of prednisone for AECOPD.  He had some improvement in cough but has since returned.  He reports it as dry and paroxysmal at times.  He has  had a slight increase in shortness of breath as well.  He denies any wheezing, orthopnea, PND, chest pain, lower extremity edema.  He continues on Trelegy inhaler.  He rarely uses his rescue.  He has tried some over-the-counter cough medicines but they made him sick on his stomach.  No Known Allergies  Immunization History  Administered Date(s) Administered   Fluad Quad(high Dose 65+) 10/18/2019   Influenza-Unspecified 10/20/2013, 10/30/2020   PFIZER(Purple Top)SARS-COV-2 Vaccination 02/05/2019, 03/08/2019   PNEUMOCOCCAL CONJUGATE-20 10/30/2020   Pfizer Covid-19 Vaccine Bivalent Booster 59yrs & up 10/30/2020   Pneumococcal Conjugate-13 11/25/2019   Pneumococcal Polysaccharide-23 12/27/2007   Td 11/27/2007, 07/31/2020   Zoster Recombinat (Shingrix) 01/30/2021   Zoster, Live 12/27/2006    Past Medical History:  Diagnosis Date   Anal fissure    COPD (chronic obstructive pulmonary disease) ([place])    Diverticulosis    DM type 2 (diabetes mellitus, type 2) (HCC)    GERD (gastroesophageal reflux disease)    uses prilosec    Hypertension    Pneumonia 2018   Raynaud's disease    Shortness of breath dyspnea    WITH EXERTION   Sinusitis    Tremors of nervous system     Tobacco History: Social History   Tobacco Use  Smoking Status Former   Packs/day: 1.00   Years: 45.00   Pack years: 45.00   Types: Cigarettes   Quit date: 01/21/1994   Years since quitting: 27.1  Smokeless Tobacco Never   Counseling given: Not Answered   Outpatient Medications Prior  to Visit  Medication Sig Dispense Refill   amLODipine (NORVASC) 5 MG tablet Take 1 tablet (5 mg total) by mouth daily. 90 tablet 3   amoxicillin-clavulanate (AUGMENTIN) 875-125 MG tablet Take 1 tablet by mouth 2 (two) times daily for 10 days. 20 tablet 0   aspirin EC 81 MG tablet Take 81 mg by mouth daily.     Cyanocobalamin (B-12) 1000 MCG CAPS Take 1 capsule by mouth daily.     Flaxseed, Linseed, (FLAXSEED OIL) 1200 MG CAPS Take  1 capsule by mouth daily.     furosemide (LASIX) 20 MG tablet Take 1 tablet (20 mg total) by mouth daily as needed for edema. 30 tablet 0   Ginkgo Biloba EXTR Take 1 tablet by mouth 2 (two) times daily.     guaiFENesin (MUCINEX) 600 MG 12 hr tablet Take 2 tablets (1,200 mg total) by mouth 2 (two) times daily. 30 tablet 2   ketoconazole (NIZORAL) 2 % shampoo APPLY TO AFFECTED AREA(S) TOPICALLY TWO TIMES PER WEEK (Patient taking differently: 1 application See admin instructions. Take every time he takes a shower) 120 mL 0   loratadine (CLARITIN) 10 MG tablet TAKE 1 TABLET EVERY DAY 90 tablet 1   meloxicam (MOBIC) 15 MG tablet TAKE 1 TABLET EVERY DAY AS NEEDED FOR JOINT PAIN (Patient taking differently: Take 15 mg by mouth daily.) 90 tablet 0   metFORMIN (GLUCOPHAGE) 500 MG tablet Take 0.5 tablets (250 mg total) by mouth daily. 90 tablet 3   Misc Natural Products (HIMALAYAN GOJI PO) Take 1 tablet by mouth in the morning and at bedtime.      Multiple Vitamin (MULTIVITAMIN WITH MINERALS) TABS tablet Take 1 tablet by mouth daily.     omeprazole (PRILOSEC) 20 MG capsule Take 1 capsule (20 mg total) by mouth 2 (two) times daily. 180 capsule 3   OXYGEN Inhale 2 L into the lungs continuous.     pravastatin (PRAVACHOL) 40 MG tablet Take 0.5 tablets (20 mg total) by mouth daily. 45 tablet 3   primidone (MYSOLINE) 50 MG tablet Take 1 tablet (50 mg total) by mouth 2 (two) times daily. 180 tablet 3   sildenafil (VIAGRA) 50 MG tablet TAKE 1 TABLET BY MOUTH DAILY AS NEEDED FOR ERECTILE DYSFUNCTION. 10 tablet 3   terazosin (HYTRIN) 1 MG capsule Take 1 capsule (1 mg total) by mouth 2 (two) times daily. 180 capsule 3   Tetrahydrozoline HCl (VISINE OP) Place 1 drop into both eyes 2 (two) times daily.     triamcinolone cream (KENALOG) 0.1 % APPLY 1 APPLICATION TOPICALLY 2 (TWO) TIMES DAILY. 80 g 0   Fluticasone-Umeclidin-Vilant (TRELEGY ELLIPTA) 100-62.5-25 MCG/INH AEPB INHALE 1 PUFF INTO THE LUNGS DAILY. 180 each 3    predniSONE (DELTASONE) 20 MG tablet Take 40mg  for 5 days (Patient not taking: Reported on 03/05/2021) 10 tablet 0   No facility-administered medications prior to visit.     Review of Systems:   Constitutional: No weight loss or gain, night sweats, fevers, chills, fatigue, or lassitude. HEENT: No headaches, difficulty swallowing, tooth/dental problems, or sore throat. No sneezing, itching, ear ache, nasal congestion, or post nasal drip CV:  No chest pain, orthopnea, PND, swelling in lower extremities, anasarca, dizziness, palpitations, syncope Resp: +shortness of breath with exertion (slight increase); dry, paroxysmal cough. No excess mucus or change in color of mucus. No hemoptysis. No wheezing.  No chest wall deformity GI:  No heartburn, indigestion, abdominal pain, nausea, vomiting, diarrhea, change in bowel habits, loss of  appetite, bloody stools.  GU: No dysuria, change in color of urine, urgency or frequency.  No flank pain, no hematuria  Skin: No rash, lesions, ulcerations MSK:  No joint pain or swelling.  No decreased range of motion.  No back pain. Neuro: No dizziness or lightheadedness.  Psych: No depression or anxiety. Mood stable.     Physical Exam:  BP 138/72 (BP Location: Right Arm, Patient Position: Sitting)    Pulse 89    Temp 98.7 F (37.1 C)    Resp 18    SpO2 93% Comment: 2 lpm POC  GEN: Pleasant, interactive, chronically-ill appearing; in no acute distress. HEENT:  Normocephalic and atraumatic. EACs patent bilaterally. TM pearly gray with present light reflex bilaterally. PERRLA. Sclera white. Nasal turbinates pink, moist and patent bilaterally. No rhinorrhea present. Oropharynx pink and moist, without exudate or edema. Scattered plaques on tongue NECK:  Supple w/ fair ROM. No JVD present. Normal carotid impulses w/o bruits. Thyroid symmetrical with no goiter or nodules palpated. No lymphadenopathy.   CV: RRR, no m/r/g, no peripheral edema. Pulses intact, +2  bilaterally. No cyanosis, pallor or clubbing. PULMONARY:  Unlabored, regular breathing. Scattered rhonchi bilaterally A&P. No accessory muscle use. No dullness to percussion. +bronchitic, hacking cough noted during exam GI: BS present and normoactive. Soft, non-tender to palpation. No organomegaly or masses detected. No CVA tenderness. MSK: No erythema, warmth or tenderness. Cap refil <2 sec all extrem. No deformities or joint swelling noted.  Neuro: A/Ox3. No focal deficits noted.   Skin: Warm, no lesions or rashe Psych: Normal affect and behavior. Judgement and thought content appropriate.     Lab Results:  CBC    Component Value Date/Time   WBC 5.7 02/12/2021 1221   RBC 4.24 02/12/2021 1221   HGB 13.5 02/12/2021 1221   HCT 40.0 02/12/2021 1221   PLT 167.0 02/12/2021 1221   MCV 94.2 02/12/2021 1221   MCH 32.3 02/29/2020 0349   MCHC 33.8 02/12/2021 1221   RDW 14.5 02/12/2021 1221   LYMPHSABS 1.3 05/30/2020 1019   MONOABS 0.6 05/30/2020 1019   EOSABS 0.4 05/30/2020 1019   BASOSABS 0.0 05/30/2020 1019    BMET    Component Value Date/Time   NA 140 02/12/2021 1221   NA 143 01/27/2019 1421   K 4.5 02/12/2021 1221   CL 103 02/12/2021 1221   CO2 29 02/12/2021 1221   GLUCOSE 100 (H) 02/12/2021 1221   BUN 20 02/12/2021 1221   BUN 15 01/27/2019 1421   CREATININE 1.17 02/12/2021 1221   CREATININE 1.06 10/18/2019 1412   CALCIUM 9.2 02/12/2021 1221   GFRNONAA >60 02/29/2020 0349   GFRAA >60 05/06/2019 1637    BNP    Component Value Date/Time   BNP 75.9 02/20/2020 1243     Imaging:  DG Chest 2 View  Result Date: 03/05/2021 CLINICAL DATA:  worsening cough EXAM: CHEST - 2 VIEW COMPARISON:  Chest CT 04/28/2020, radiograph 03/30/2020 FINDINGS: Unchanged cardiomediastinal silhouette. Emphysema with scattered parenchymal scarring most prominent in the right mid to lower lung. No new focal airspace disease. No large pleural effusion. No visible pneumothorax. No acute osseous  abnormality. IMPRESSION: Emphysema with scattered parenchymal scarring most prominent in the right mid to lower lung. No new focal airspace disease. Electronically Signed   By: Maurine Simmering M.D.   On: 03/05/2021 16:14    betamethasone acetate-betamethasone sodium phosphate (CELESTONE) injection 6 mg     Date Action Dose Route User   02/13/2021 1539 Given 6  mg Intra-articular (Finger(s)) Sherilyn Cooter, MD      lidocaine (XYLOCAINE) 1 % (with pres) injection 1 mL     Date Action Dose Route User   02/13/2021 1539 Given 1 mL Other (Finger(s)) Sherilyn Cooter, MD       PFT Results Latest Ref Rng & Units 05/26/2020  FVC-Pre L 2.48  FVC-Predicted Pre % 67  FVC-Post L 2.41  FVC-Predicted Post % 66  Pre FEV1/FVC % % 52  Post FEV1/FCV % % 51  FEV1-Pre L 1.28  FEV1-Predicted Pre % 50  FEV1-Post L 1.23  DLCO uncorrected ml/min/mmHg 7.92  DLCO UNC% % 34  DLCO corrected ml/min/mmHg 8.38  DLCO COR %Predicted % 36  DLVA Predicted % 52  TLC L 5.03  TLC % Predicted % 73  RV % Predicted % 80    No results found for: NITRICOXIDE      Assessment & Plan:   COPD with acute exacerbation (HCC) Persistent symptoms with some improvement after previous prednisone burst.  CXR today with no acute process and emphysematous changes noted.  Significant thrush noted upon exam.  Will trial change from Trelegy to [facility] given this and possible upper airway irritation. Prednisone taper and z pack today.  Patient Instructions  Stop Trelegy inhaler. Start Breztri 2 puffs Twice daily with spacer. Brush tongue and rinse mouth afterwards -Continue Albuterol inhaler 2 puffs every 6 hours as needed for shortness of breath or wheezing. Notify if symptoms persist despite rescue inhaler/neb use. -Continue claritin 10 mg daily -Continue mucinex 1200 mg Twice daily  -Continue flutter valve therapy 2-3 times a day  -Continue supplemental oxygen 2 lpm for goal >88-90%  -Prednisone taper pack. 4 tabs for 2  days, then 3 tabs for 2 days, 2 tabs for 2 days, then 1 tab for 2 days, then stop. Take in AM with food -Z pack. Take 2 tabs on day one then 1 tab for four more days. Take with food. -Nystatin oral rinse 5 mL four times a day for 10 days. Swish and swallow -Tessalon perles (benzonatate) 1 capsule every 8 hours as needed for cough -Chlortab 4 mg At bedtime for cough  Notify if worsening breathlessness, cough, mucus production, fatigue, or wheezing occurs.   Follow up in 2 weeks with Dr. [name] or Alanson Aly. If symptoms do not improve or worsen, please contact office for sooner follow up or seek emergency care.    Oral candidiasis Significant thrush on exam. Nystatin oral rinses for 10 days. Switch from Trelegy to [facility] with spacer.   Chronic respiratory failure with hypoxia (HCC) Oxygen stable. Continue supplemental O2 2 lpm for goal SpO2 >88-90%     Clayton Bibles, NP 03/05/2021  Pt aware and understands NP's role.

## 2021-03-05 NOTE — Patient Instructions (Addendum)
Stop Trelegy inhaler. Start Breztri 2 puffs Twice daily with spacer. Brush tongue and rinse mouth afterwards -Continue Albuterol inhaler 2 puffs every 6 hours as needed for shortness of breath or wheezing. Notify if symptoms persist despite rescue inhaler/neb use. -Continue claritin 10 mg daily -Continue mucinex 1200 mg Twice daily  -Continue flutter valve therapy 2-3 times a day  -Continue supplemental oxygen 2 lpm for goal >88-90%  -Prednisone taper pack. 4 tabs for 2 days, then 3 tabs for 2 days, 2 tabs for 2 days, then 1 tab for 2 days, then stop. Take in AM with food -Z pack. Take 2 tabs on day one then 1 tab for four more days. Take with food. -Nystatin oral rinse 5 mL four times a day for 10 days. Swish and swallow -Tessalon perles (benzonatate) 1 capsule every 8 hours as needed for cough -Chlortab 4 mg At bedtime for cough  Notify if worsening breathlessness, cough, mucus production, fatigue, or wheezing occurs.   Follow up in 2 weeks with Dr. [name] or Alanson Aly. If symptoms do not improve or worsen, please contact office for sooner follow up or seek emergency care.

## 2021-03-05 NOTE — Assessment & Plan Note (Addendum)
Persistent symptoms with some improvement after previous prednisone burst.  CXR today with no acute process and emphysematous changes noted.  Significant thrush noted upon exam.  Will trial change from Trelegy to [facility] given this and possible upper airway irritation. Prednisone taper and z pack today.  Patient Instructions  Stop Trelegy inhaler. Start Breztri 2 puffs Twice daily with spacer. Brush tongue and rinse mouth afterwards -Continue Albuterol inhaler 2 puffs every 6 hours as needed for shortness of breath or wheezing. Notify if symptoms persist despite rescue inhaler/neb use. -Continue claritin 10 mg daily -Continue mucinex 1200 mg Twice daily  -Continue flutter valve therapy 2-3 times a day  -Continue supplemental oxygen 2 lpm for goal >88-90%  -Prednisone taper pack. 4 tabs for 2 days, then 3 tabs for 2 days, 2 tabs for 2 days, then 1 tab for 2 days, then stop. Take in AM with food -Z pack. Take 2 tabs on day one then 1 tab for four more days. Take with food. -Nystatin oral rinse 5 mL four times a day for 10 days. Swish and swallow -Tessalon perles (benzonatate) 1 capsule every 8 hours as needed for cough -Chlortab 4 mg At bedtime for cough  Notify if worsening breathlessness, cough, mucus production, fatigue, or wheezing occurs.   Follow up in 2 weeks with Dr. [name] or Alanson Aly. If symptoms do not improve or worsen, please contact office for sooner follow up or seek emergency care.

## 2021-03-05 NOTE — Assessment & Plan Note (Addendum)
Oxygen stable. Continue supplemental O2 2 lpm for goal SpO2 >88-90%

## 2021-03-06 ENCOUNTER — Telehealth: Payer: Self-pay | Admitting: Family Medicine

## 2021-03-06 NOTE — Telephone Encounter (Signed)
Pt was seen on 02/27/21 was prescribed: guaiFENesin (MUCINEX) 600 MG 12 hr tablet; Take 2 tablets, and amoxicillin-clavulanate (AUGMENTIN) 875-125 MG tablet; Take 1 tablet by mouth 2 (two) times daily for 10 days.   Please advise.

## 2021-03-06 NOTE — Telephone Encounter (Signed)
Pt had a recent visit with Lovena Le, pt states that he is still experiencing coughing that is becoming more painful. Pt has seen a pulmonologist, he would like for someone to contact hi to discuss symps.   Please advise.

## 2021-03-08 ENCOUNTER — Telehealth: Payer: Self-pay | Admitting: Pulmonary Disease

## 2021-03-08 NOTE — Telephone Encounter (Signed)
Patient came and had some questions about his [place] [employer] patient assistance forms. Questions were answered and I took paperwork form patient. It has been faxed to [place] and placed in scan folder. Nothing further needed at this time.

## 2021-03-15 DIAGNOSIS — R1314 Dysphagia, pharyngoesophageal phase: Secondary | ICD-10-CM | POA: Diagnosis not present

## 2021-03-19 ENCOUNTER — Other Ambulatory Visit: Payer: Self-pay

## 2021-03-19 ENCOUNTER — Ambulatory Visit (INDEPENDENT_AMBULATORY_CARE_PROVIDER_SITE_OTHER): Payer: Medicare Other | Admitting: Nurse Practitioner

## 2021-03-19 ENCOUNTER — Encounter: Payer: Self-pay | Admitting: Nurse Practitioner

## 2021-03-19 DIAGNOSIS — J449 Chronic obstructive pulmonary disease, unspecified: Secondary | ICD-10-CM | POA: Diagnosis not present

## 2021-03-19 DIAGNOSIS — J849 Interstitial pulmonary disease, unspecified: Secondary | ICD-10-CM | POA: Diagnosis not present

## 2021-03-19 DIAGNOSIS — E1159 Type 2 diabetes mellitus with other circulatory complications: Secondary | ICD-10-CM | POA: Insufficient documentation

## 2021-03-19 DIAGNOSIS — I10 Essential (primary) hypertension: Secondary | ICD-10-CM

## 2021-03-19 DIAGNOSIS — J9611 Chronic respiratory failure with hypoxia: Secondary | ICD-10-CM | POA: Diagnosis not present

## 2021-03-19 DIAGNOSIS — B370 Candidal stomatitis: Secondary | ICD-10-CM | POA: Diagnosis not present

## 2021-03-19 DIAGNOSIS — J4489 Other specified chronic obstructive pulmonary disease: Secondary | ICD-10-CM

## 2021-03-19 DIAGNOSIS — I152 Hypertension secondary to endocrine disorders: Secondary | ICD-10-CM | POA: Insufficient documentation

## 2021-03-19 NOTE — Assessment & Plan Note (Addendum)
Oxygen stable on current 2 lpm POC. Goal SpO2 >88-90%. Will plan for walking oximetry at next visit.

## 2021-03-19 NOTE — Patient Instructions (Addendum)
Continue Breztri 2 puffs Twice daily with spacer. Brush tongue and rinse mouth afterwards -Continue Albuterol inhaler 2 puffs every 6 hours as needed for shortness of breath or wheezing. Notify if symptoms persist despite rescue inhaler/neb use. -Continue claritin 10 mg daily -Continue mucinex 1200 mg Twice daily as needed for chest congestion/cough -Continue flutter valve therapy 2-3 times a day  -Continue supplemental oxygen 2 lpm POC for goal >88-90%  Notify if worsening breathlessness, cough, mucus production, fatigue, or wheezing occurs.  Maintain up to date vaccinations, including influenza, COVID, and pneumococcal.  Wash your hands often and avoid sick exposures.  Encouraged masking in crowds.  Avoid triggers, when possible.  Exercise, as tolerated. Notify if worsening symptoms upon exertion occur.   Follow up in 3 months with Dr. [name] or Alanson Aly. If symptoms do not improve or worsen, please contact office for sooner follow up or seek emergency care.

## 2021-03-19 NOTE — Assessment & Plan Note (Signed)
Initially elevated. Decreased to 132/62 after rest. Continue current antihypertensive regimen and follow up with PCP as scheduled.

## 2021-03-19 NOTE — Progress Notes (Signed)
@Patient  ID: Johnathan [name], male    DOB: [birth year], 88 y.o.   MRN: [id number]  Chief Complaint  Patient presents with   Follow-up    cough    Referring provider: CoplandGay Filler, MD  HPI: 88 year old male, former smoker (45-pack-year history) followed for COPD with chronic bronchitis and emphysema, interstitial pulmonary disease, and chronic respiratory failure with hypoxia.  He is a patient of Dr. Matilde [name] and was last seen in office on 03/05/2021.  Past medical history significant for hypertension, Raynaud's syndrome, sinusitis, GERD, DM2.  TEST/EVENTS:  02/04/2019 echocardiogram: EF 60 to 65%.  Trivial MR.  AR mild to moderate. 04/28/2020 HRCT chest: Atherosclerosis.  Right and left pulmonary arteries are enlarged.  Centrilobular and paraseptal emphysema.  Peripheral and basilar predominant subpleural reticular densities and groundglass, asymmetric on the right with mild associated architectural distortion and calcified granuloma in posterior LUL.  Could be postinfectious/postinflammatory in etiology.  Indeterminate for UIP. 05/26/2020 PFTs: FVC 2.48 (67), FEV1 1.28 (50), ratio 51, TLC 73%, DLCO 36%.  No BD.   02/23/2021: OV with Dr. Vaughan [name]. Recent EGD by Dr. Ardis [name] with findings of soft tissue about of soft tissue about the vocal cords for which she has been referred to ENT and also noted to have esophageal candidiasis and started on Diflucan. Post COVID in Feb '22 with worsening SOB and oxygen requirements after - CT showed indeterminate ILD. Continued supplemental O2. Tx for mild AECOPD with short prednisone burst.   03/05/2021: OV with Johnathan Vandevelde NP for acute visit. Had some improvement with prednisone burst but cough returned and worsened after coming off as well as increase SOB. CXR with emphysema and scattered parenchymal scarring most prominent in the right mid to lower lung without acute process. Tx for AECOPD and bronchitis with extended prednisone taper and z pack. Nystatin oral rinses for  thrush. Changed from Trelegy to [facility] d/t possible upper airway irritation and thrush. Advised to use tessalon perles and chlortab for cough suppression.   03/19/2021: Today - follow up  Patient presents today for follow up after being treated for AECOPD and thrush. He reports feeling significantly better over the past week. His breathing has improved and returned to his baseline and his cough has resolved. He has also noticed that his thrush is gone and he has a "normal" tongue now. He continues on his supplemental oxygen 2 lpm POC and maintains saturations >88-90%. He denies wheezing, orthopnea, PND, chest pain or lower extremity swelling. He continues on Breztri Twice daily with spacer and has not required his albuterol recently. He continues on claritin daily for allergies. He has stopped mucinex but said he will restart if his chest congestion or cough returns.   No Known Allergies  Immunization History  Administered Date(s) Administered   Fluad Quad(high Dose 65+) 10/18/2019   Influenza-Unspecified 10/20/2013, 10/30/2020   PFIZER(Purple Top)SARS-COV-2 Vaccination 02/05/2019, 03/08/2019   PNEUMOCOCCAL CONJUGATE-20 10/30/2020   Pfizer Covid-19 Vaccine Bivalent Booster 18yrs & up 10/30/2020   Pneumococcal Conjugate-13 11/25/2019   Pneumococcal Polysaccharide-23 12/27/2007   Td 11/27/2007, 07/31/2020   Zoster Recombinat (Shingrix) 01/30/2021   Zoster, Live 12/27/2006    Past Medical History:  Diagnosis Date   Anal fissure    COPD (chronic obstructive pulmonary disease) ([place])    Diverticulosis    DM type 2 (diabetes mellitus, type 2) ([place])    GERD (gastroesophageal reflux disease)    uses prilosec    Hypertension    Pneumonia 2018   Raynaud's disease  Shortness of breath dyspnea    WITH EXERTION   Sinusitis    Tremors of nervous system     Tobacco History: Social History   Tobacco Use  Smoking Status Former   Packs/day: 1.00   Years: 45.00   Pack years: 45.00    Types: Cigarettes   Quit date: 01/21/1994   Years since quitting: 27.1  Smokeless Tobacco Never   Counseling given: Not Answered   Outpatient Medications Prior to Visit  Medication Sig Dispense Refill   amLODipine (NORVASC) 5 MG tablet Take 1 tablet (5 mg total) by mouth daily. 90 tablet 3   aspirin EC 81 MG tablet Take 81 mg by mouth daily.     Budeson-Glycopyrrol-Formoterol (BREZTRI AEROSPHERE) 160-9-4.8 MCG/ACT AERO Inhale 2 puffs into the lungs in the morning and at bedtime. 10.7 g 3   chlorpheniramine (CHLOR-TRIMETON) 4 MG tablet Take 1 tablet (4 mg total) by mouth at bedtime as needed (cough). 14 tablet 0   Cyanocobalamin (B-12) 1000 MCG CAPS Take 1 capsule by mouth daily.     Flaxseed, Linseed, (FLAXSEED OIL) 1200 MG CAPS Take 1 capsule by mouth daily.     furosemide (LASIX) 20 MG tablet Take 1 tablet (20 mg total) by mouth daily as needed for edema. 30 tablet 0   Ginkgo Biloba EXTR Take 1 tablet by mouth 2 (two) times daily.     ketoconazole (NIZORAL) 2 % shampoo APPLY TO AFFECTED AREA(S) TOPICALLY TWO TIMES PER WEEK (Patient taking differently: 1 application See admin instructions. Take every time he takes a shower) 120 mL 0   loratadine (CLARITIN) 10 MG tablet TAKE 1 TABLET EVERY DAY 90 tablet 1   meloxicam (MOBIC) 15 MG tablet TAKE 1 TABLET EVERY DAY AS NEEDED FOR JOINT PAIN (Patient taking differently: Take 15 mg by mouth daily.) 90 tablet 0   metFORMIN (GLUCOPHAGE) 500 MG tablet Take 0.5 tablets (250 mg total) by mouth daily. 90 tablet 3   Misc Natural Products (HIMALAYAN GOJI PO) Take 1 tablet by mouth in the morning and at bedtime.      Multiple Vitamin (MULTIVITAMIN WITH MINERALS) TABS tablet Take 1 tablet by mouth daily.     omeprazole (PRILOSEC) 20 MG capsule Take 1 capsule (20 mg total) by mouth 2 (two) times daily. 180 capsule 3   OXYGEN Inhale 2 L into the lungs continuous.     pravastatin (PRAVACHOL) 40 MG tablet Take 0.5 tablets (20 mg total) by mouth daily. 45 tablet 3    primidone (MYSOLINE) 50 MG tablet Take 1 tablet (50 mg total) by mouth 2 (two) times daily. 180 tablet 3   sildenafil (VIAGRA) 50 MG tablet TAKE 1 TABLET BY MOUTH DAILY AS NEEDED FOR ERECTILE DYSFUNCTION. 10 tablet 3   Spacer/Aero-Holding Chambers (AEROCHAMBER MV) inhaler Use as instructed 1 each 0   terazosin (HYTRIN) 1 MG capsule Take 1 capsule (1 mg total) by mouth 2 (two) times daily. 180 capsule 3   Tetrahydrozoline HCl (VISINE OP) Place 1 drop into both eyes 2 (two) times daily.     triamcinolone cream (KENALOG) 0.1 % APPLY 1 APPLICATION TOPICALLY 2 (TWO) TIMES DAILY. 80 g 0   azithromycin (ZITHROMAX) 250 MG tablet Take 2 tabs on day one followed by 1 tab for four days (Patient not taking: Reported on 03/19/2021) 6 tablet 0   benzonatate (TESSALON) 100 MG capsule Take 2 capsules (200 mg total) by mouth 3 (three) times daily as needed for cough. 30 capsule 1   guaiFENesin (MUCINEX)  600 MG 12 hr tablet Take 2 tablets (1,200 mg total) by mouth 2 (two) times daily. 30 tablet 2   nystatin (MYCOSTATIN) 100000 UNIT/ML suspension Take 5 mLs (500,000 Units total) by mouth 4 (four) times daily. 200 mL 0   predniSONE (DELTASONE) 10 MG tablet 4 tabs for 2 days, then 3 tabs for 2 days, 2 tabs for 2 days, then 1 tab for 2 days, then stop 20 tablet 0   No facility-administered medications prior to visit.     Review of Systems:   Constitutional: No weight loss or gain, night sweats, fevers, chills, fatigue, or lassitude. HEENT: No headaches, difficulty swallowing, tooth/dental problems, or sore throat. No sneezing, itching, ear ache, nasal congestion, or post nasal drip CV:  No chest pain, orthopnea, PND, swelling in lower extremities, anasarca, dizziness, palpitations, syncope Resp: +shortness of breath with strenuous activity (baseline). No excess mucus or change in color of mucus. No productive or non-productive. No hemoptysis. No wheezing.  No chest wall deformity GI:  No heartburn, indigestion,  abdominal pain, nausea, vomiting, diarrhea, change in bowel habits, loss of appetite, bloody stools.  GU: No dysuria, change in color of urine, urgency or frequency.  No flank pain, no hematuria  Skin: No rash, lesions, ulcerations MSK:  No joint pain or swelling.  No decreased range of motion.  No back pain. Neuro: No dizziness or lightheadedness.  Psych: No depression or anxiety. Mood stable.     Physical Exam:  BP 132/62 (BP Location: Left Arm, Patient Position: Sitting, Cuff Size: Normal)    Pulse 77    Temp 98.1 F (36.7 C) (Oral)    Ht 5\' 9"  (1.753 m)    Wt 150 lb (68 kg) Comment: pt reported   SpO2 96% Comment: 2L pulse   BMI 22.15 kg/m   GEN: Pleasant, interactive, well-nourished, elderly; in no acute distress. HEENT:  Normocephalic and atraumatic. EACs patent bilaterally. TM pearly gray with present light reflex bilaterally. PERRLA. Sclera white. Nasal turbinates pink, moist and patent bilaterally. No rhinorrhea present. Oropharynx pink and moist, without exudate or edema. No lesions, ulcerations, or postnasal drip.  NECK:  Supple w/ fair ROM. No JVD present. Normal carotid impulses w/o bruits. Thyroid symmetrical with no goiter or nodules palpated. No lymphadenopathy.   CV: RRR, no m/r/g, no peripheral edema. Pulses intact, +2 bilaterally. No cyanosis, pallor or clubbing. PULMONARY:  Unlabored, regular breathing. Clear bilaterally A&P w/o wheezes/rales/rhonchi. No accessory muscle use. No dullness to percussion. GI: BS present and normoactive. Soft, non-tender to palpation. No organomegaly or masses detected. No CVA tenderness. MSK: No erythema, warmth or tenderness. Cap refil <2 sec all extrem. No deformities or joint swelling noted.  Neuro: A/Ox3. No focal deficits noted.   Skin: Warm, no lesions or rashe Psych: Normal affect and behavior. Judgement and thought content appropriate.     Lab Results:  CBC    Component Value Date/Time   WBC 5.7 02/12/2021 1221   RBC 4.24  02/12/2021 1221   HGB 13.5 02/12/2021 1221   HCT 40.0 02/12/2021 1221   PLT 167.0 02/12/2021 1221   MCV 94.2 02/12/2021 1221   MCH 32.3 02/29/2020 0349   MCHC 33.8 02/12/2021 1221   RDW 14.5 02/12/2021 1221   LYMPHSABS 1.3 05/30/2020 1019   MONOABS 0.6 05/30/2020 1019   EOSABS 0.4 05/30/2020 1019   BASOSABS 0.0 05/30/2020 1019    BMET    Component Value Date/Time   NA 140 02/12/2021 1221   NA 143 01/27/2019 1421  K 4.5 02/12/2021 1221   CL 103 02/12/2021 1221   CO2 29 02/12/2021 1221   GLUCOSE 100 (H) 02/12/2021 1221   BUN 20 02/12/2021 1221   BUN 15 01/27/2019 1421   CREATININE 1.17 02/12/2021 1221   CREATININE 1.06 10/18/2019 1412   CALCIUM 9.2 02/12/2021 1221   GFRNONAA >60 02/29/2020 0349   GFRAA >60 05/06/2019 1637    BNP    Component Value Date/Time   BNP 75.9 02/20/2020 1243     Imaging:  DG Chest 2 View  Result Date: 03/05/2021 CLINICAL DATA:  worsening cough EXAM: CHEST - 2 VIEW COMPARISON:  Chest CT 04/28/2020, radiograph 03/30/2020 FINDINGS: Unchanged cardiomediastinal silhouette. Emphysema with scattered parenchymal scarring most prominent in the right mid to lower lung. No new focal airspace disease. No large pleural effusion. No visible pneumothorax. No acute osseous abnormality. IMPRESSION: Emphysema with scattered parenchymal scarring most prominent in the right mid to lower lung. No new focal airspace disease. Electronically Signed   By: Maurine Simmering M.D.   On: 03/05/2021 16:14    betamethasone acetate-betamethasone sodium phosphate (CELESTONE) injection 6 mg     Date Action Dose Route User   02/13/2021 1539 Given 6 mg Intra-articular (Finger(s)) Sherilyn Cooter, MD      lidocaine (XYLOCAINE) 1 % (with pres) injection 1 mL     Date Action Dose Route User   02/13/2021 1539 Given 1 mL Other (Finger(s)) Sherilyn Cooter, MD       PFT Results Latest Ref Rng & Units 05/26/2020  FVC-Pre L 2.48  FVC-Predicted Pre % 67  FVC-Post L 2.41   FVC-Predicted Post % 66  Pre FEV1/FVC % % 52  Post FEV1/FCV % % 51  FEV1-Pre L 1.28  FEV1-Predicted Pre % 50  FEV1-Post L 1.23  DLCO uncorrected ml/min/mmHg 7.92  DLCO UNC% % 34  DLCO corrected ml/min/mmHg 8.38  DLCO COR %Predicted % 36  DLVA Predicted % 52  TLC L 5.03  TLC % Predicted % 73  RV % Predicted % 80    No results found for: NITRICOXIDE      Assessment & Plan:   COPD with chronic bronchitis and emphysema (HCC) Asthmatic component as well. Significant improvement since last visit after tx for AECOPD. Compliant with triple therapy regimen. Resolved thrush after nystatin course and change from [employer] to [employer]. Previously completed pulmonary rehab. Advised to continue exercise daily.   Patient Instructions  Continue Breztri 2 puffs Twice daily with spacer. Brush tongue and rinse mouth afterwards -Continue Albuterol inhaler 2 puffs every 6 hours as needed for shortness of breath or wheezing. Notify if symptoms persist despite rescue inhaler/neb use. -Continue claritin 10 mg daily -Continue mucinex 1200 mg Twice daily as needed for chest congestion/cough -Continue flutter valve therapy 2-3 times a day  -Continue supplemental oxygen 2 lpm POC for goal >88-90%  Notify if worsening breathlessness, cough, mucus production, fatigue, or wheezing occurs.  Maintain up to date vaccinations, including influenza, COVID, and pneumococcal.  Wash your hands often and avoid sick exposures.  Encouraged masking in crowds.  Avoid triggers, when possible.  Exercise, as tolerated. Notify if worsening symptoms upon exertion occur.   Follow up in 3 months with Dr. Vaughan [name] or Alanson Aly. If symptoms do not improve or worsen, please contact office for sooner follow up or seek emergency care.    Chronic respiratory failure with hypoxia (HCC) Oxygen stable on current 2 lpm POC. Goal SpO2 >88-90%. Will plan for walking oximetry at next visit.  Interstitial pulmonary disease  ([place]) Had significant decline in breathing and increased oxygen requirements after COVID infection Feb '22. CT indeterminate for UIP. Continue with exercise regimen.   Oral candidiasis Resolved. Completed Nystatin course. Advised to continue with Breztri with spacer.   Hypertension Initially elevated. Decreased to 132/62 after rest. Continue current antihypertensive regimen and follow up with PCP as scheduled.    I spent 25 minutes of dedicated to the care of this patient on the date of this encounter to include pre-visit review of records, face-to-face time with the patient discussing conditions above, post visit ordering of testing, clinical documentation with the electronic health record, making appropriate referrals as documented, and communicating necessary findings to members of the patients care team.  Clayton Bibles, NP 03/19/2021  Pt aware and understands NP's role.

## 2021-03-19 NOTE — Assessment & Plan Note (Signed)
Resolved. Completed Nystatin course. Advised to continue with Breztri with spacer.

## 2021-03-19 NOTE — Assessment & Plan Note (Addendum)
Asthmatic component as well. Significant improvement since last visit after tx for AECOPD. Compliant with triple therapy regimen. Resolved thrush after nystatin course and change from [employer] to [employer]. Previously completed pulmonary rehab. Advised to continue exercise daily.   Patient Instructions  Continue Breztri 2 puffs Twice daily with spacer. Brush tongue and rinse mouth afterwards -Continue Albuterol inhaler 2 puffs every 6 hours as needed for shortness of breath or wheezing. Notify if symptoms persist despite rescue inhaler/neb use. -Continue claritin 10 mg daily -Continue mucinex 1200 mg Twice daily as needed for chest congestion/cough -Continue flutter valve therapy 2-3 times a day  -Continue supplemental oxygen 2 lpm POC for goal >88-90%  Notify if worsening breathlessness, cough, mucus production, fatigue, or wheezing occurs.  Maintain up to date vaccinations, including influenza, COVID, and pneumococcal.  Wash your hands often and avoid sick exposures.  Encouraged masking in crowds.  Avoid triggers, when possible.  Exercise, as tolerated. Notify if worsening symptoms upon exertion occur.   Follow up in 3 months with Dr. [name] or Alanson Aly. If symptoms do not improve or worsen, please contact office for sooner follow up or seek emergency care.

## 2021-03-19 NOTE — Assessment & Plan Note (Signed)
Had significant decline in breathing and increased oxygen requirements after COVID infection Feb '22. CT indeterminate for UIP. Continue with exercise regimen.

## 2021-03-21 DIAGNOSIS — U071 COVID-19: Secondary | ICD-10-CM | POA: Diagnosis not present

## 2021-03-23 NOTE — Progress Notes (Signed)
[facility] [address], [place], [place] [address] [phone]  Date:  03/26/2021   Name:  Johnathan [name]   DOB:  Oct 06, 1932   MRN:  [id number]  PCP:  Darreld Mclean, MD    Chief Complaint: Medical Management of Chronic Issues (Medication )   History of Present Illness:  Johnathan [name] is a 88 y.o. very pleasant male patient who presents with the following:  Pt seen today for follow-up Last seen by myself in January   History of emphysema, diabetes, COPD, hypertension, former smoker Currently been doing excellently for age until he got COVID in February 2022; he subsequently developed pulmonary fibrosis and is still using oxygen. He notes he can go up a flight of stairs without his oxygen tank  Seen by pulmonology last week  COPD with chronic bronchitis and emphysema ([place]) Asthmatic component as well. Significant improvement since last visit after tx for AECOPD. Compliant with triple therapy regimen. Resolved thrush after nystatin course and change from [employer] to [employer]. Previously completed pulmonary rehab. Advised to continue exercise daily.  Chronic respiratory failure with hypoxia (HCC) Oxygen stable on current 2 lpm POC. Goal SpO2 >88-90%. Will plan for walking oximetry at next visit. Interstitial pulmonary disease ([place]) Had significant decline in breathing and increased oxygen requirements after COVID infection Feb '22. CT indeterminate for UIP. Continue with exercise regimen.  Oral candidiasis Resolved. Completed Nystatin course. Advised to continue with Breztri with spacer.  Hypertension Initially elevated. Decreased to 132/62 after rest. Continue current antihypertensive regimen and follow up with PCP as scheduled.   Lab Results  Component Value Date   HGBA1C 6.5 02/12/2021   Eye exam- he plans to have this done soon  He thinks he needs new lenses   He has not fallen but he feels sometimes a  bit unsteady on his feet  He notes this happens especially if he has been sitting for a while and then stands up  He notes he continues to drive "as far as I want"  He might want to try tapering off his oxygen at rest- he finds if he decreases to 1 liter he does ok and his sats do not drop  He also wishes to go over his medications and see if there are any he could stop Patient Active Problem List   Diagnosis Date Noted   Interstitial pulmonary disease ([place]) 03/19/2021   Hypertension 03/19/2021   Oral candidiasis 03/05/2021   Chronic respiratory failure with hypoxia ([place]) 03/05/2021   Trigger finger, right ring finger 02/13/2021   COPD with chronic bronchitis and emphysema ([place]) 04/26/2020   COVID-19 02/25/2020   Shingles 11/14/2019   Raynaud's syndrome 11/14/2019   Skin cancer 11/14/2019   Left inguinal hernia s/p lap repair w mesh 05/06/2019 05/06/2019   Recurrent right inguinal hernia s/p lap repair w mesh 05/06/2019 03/01/2019   Diabetes mellitus ([place]) 03/01/2019   Pneumonia 04/21/2014   COPD with acute exacerbation ([place]) 04/21/2014    Past Medical History:  Diagnosis Date   Anal fissure    COPD (chronic obstructive pulmonary disease) ([place])    Diverticulosis    DM type 2 (diabetes mellitus, type 2) ([place])    GERD (gastroesophageal reflux disease)    uses prilosec    Hypertension    Pneumonia 2018   Raynaud's disease    Shortness of breath dyspnea    WITH EXERTION   Sinusitis  Tremors of nervous system     Past Surgical History:  Procedure Laterality Date   COLONOSCOPY  03/27/2005   ESOPHAGOGASTRODUODENOSCOPY (EGD) WITH PROPOFOL N/A 02/08/2021   Procedure: ESOPHAGOGASTRODUODENOSCOPY (EGD) WITH PROPOFOL;  Surgeon: Milus Banister, MD;  Location: [facility];  Service: Endoscopy;  Laterality: N/A;   HERNIA REPAIR     INGUINAL HERNIA REPAIR N/A 05/06/2019   Procedure: LAPAROSCOPIC RIGHT AND LEFT INGUINAL HERNIA(S) REPAIR;  Surgeon: Michael Boston, MD;  Location: [facility];  Service: General;  Laterality: N/A;    Social History   Tobacco Use   Smoking status: Former    Packs/day: 1.00    Years: 45.00    Pack years: 45.00    Types: Cigarettes    Quit date: 01/21/1994    Years since quitting: 27.1   Smokeless tobacco: Never  Substance Use Topics   Alcohol use: Yes    Comment: a drink daily with dinner   Drug use: No    Family History  Problem Relation Age of Onset   Dementia Brother    Diabetes Brother    Heart attack Father    Dementia Father     No Known Allergies  Medication list has been reviewed and updated.  Current Outpatient Medications on File Prior to Visit  Medication Sig Dispense Refill   amLODipine (NORVASC) 5 MG tablet Take 1 tablet (5 mg total) by mouth daily. 90 tablet 3   aspirin EC 81 MG tablet Take 81 mg by mouth daily.     Budeson-Glycopyrrol-Formoterol (BREZTRI AEROSPHERE) 160-9-4.8 MCG/ACT AERO Inhale 2 puffs into the lungs in the morning and at bedtime. 10.7 g 3   Cyanocobalamin (B-12) 1000 MCG CAPS Take 1 capsule by mouth daily.     Flaxseed, Linseed, (FLAXSEED OIL) 1200 MG CAPS Take 1 capsule by mouth daily.     furosemide (LASIX) 20 MG tablet Take 1 tablet (20 mg total) by mouth daily as needed for edema. 30 tablet 0   Ginkgo Biloba EXTR Take 1 tablet by mouth 2 (two) times daily.     ketoconazole (NIZORAL) 2 % shampoo APPLY TO AFFECTED AREA(S) TOPICALLY TWO TIMES PER WEEK (Patient taking differently: 1 application. See admin instructions. Take every time he takes a shower) 120 mL 0   loratadine (CLARITIN) 10 MG tablet TAKE 1 TABLET EVERY DAY 90 tablet 1   meloxicam (MOBIC) 15 MG tablet TAKE 1 TABLET EVERY DAY AS NEEDED FOR JOINT PAIN (Patient taking differently: Take 15 mg by mouth daily.) 90 tablet 0   metFORMIN (GLUCOPHAGE) 500 MG tablet Take 0.5 tablets (250 mg total) by mouth daily. 90 tablet 3   Misc Natural Products (HIMALAYAN GOJI PO) Take 1 tablet by mouth in the morning and at bedtime.       Multiple Vitamin (MULTIVITAMIN WITH MINERALS) TABS tablet Take 1 tablet by mouth daily.     omeprazole (PRILOSEC) 20 MG capsule Take 1 capsule (20 mg total) by mouth 2 (two) times daily. 180 capsule 3   OXYGEN Inhale 2 L into the lungs continuous.     pravastatin (PRAVACHOL) 40 MG tablet Take 0.5 tablets (20 mg total) by mouth daily. 45 tablet 3   primidone (MYSOLINE) 50 MG tablet Take 1 tablet (50 mg total) by mouth 2 (two) times daily. 180 tablet 3   sildenafil (VIAGRA) 50 MG tablet TAKE 1 TABLET BY MOUTH DAILY AS NEEDED FOR ERECTILE DYSFUNCTION. 10 tablet 3   Spacer/Aero-Holding Chambers (AEROCHAMBER MV) inhaler Use as instructed 1  each 0   terazosin (HYTRIN) 1 MG capsule Take 1 capsule (1 mg total) by mouth 2 (two) times daily. 180 capsule 3   Tetrahydrozoline HCl (VISINE OP) Place 1 drop into both eyes 2 (two) times daily.     triamcinolone cream (KENALOG) 0.1 % APPLY 1 APPLICATION TOPICALLY 2 (TWO) TIMES DAILY. 80 g 0   chlorpheniramine (CHLOR-TRIMETON) 4 MG tablet Take 1 tablet (4 mg total) by mouth at bedtime as needed (cough). (Patient not taking: Reported on 03/26/2021) 14 tablet 0   guaiFENesin (MUCINEX) 600 MG 12 hr tablet Take 2 tablets (1,200 mg total) by mouth 2 (two) times daily. (Patient not taking: Reported on 03/26/2021) 30 tablet 2   nystatin (MYCOSTATIN) 100000 UNIT/ML suspension Take 5 mLs (500,000 Units total) by mouth 4 (four) times daily. (Patient not taking: Reported on 03/26/2021) 200 mL 0   No current facility-administered medications on file prior to visit.    Review of Systems:  As per HPI- otherwise negative.   Physical Examination: Vitals:   03/26/21 1356  BP: (!) 144/70  Pulse: 69  Temp: (!) 97.5 F (36.4 C)  SpO2: 96%   Vitals:   03/26/21 1356  Weight: 150 lb (68 kg)  Height: 5\' 9"  (1.753 m)   Body mass index is 22.15 kg/m. Ideal Body Weight: Weight in (lb) to have BMI = 25: 168.9  GEN: no acute distress.  Normal weight, looks well and his normal  self today HEENT: Atraumatic, Normocephalic.  Ears and Nose: No external deformity. CV: RRR, No M/G/R. No JVD. No thrill. No extra heart sounds. PULM: CTA B, no wheezes, crackles, rhonchi. No retractions. No resp. distress. No accessory muscle use. EXTR: No c/c/e PSYCH: Normally interactive. Conversant.  Uses portable oxygen concentrator  Assessment and Plan: Gait instability  Encounter for medication review  Interstitial pulmonary disease ([place])  Essential hypertension  Type 2 diabetes mellitus without complication, without long-term current use of insulin ([place])  Patient seen today for follow-up He notes some concern about his gait, sometimes feels unsteady on his feet right after he stands up from seated.  No falls.  I encouraged him to get up carefully, consider adding a cane.  May help to move legs and squeeze leg muscles prior to standing to help reduce hypotension  Blood pressure under good control-continue current medication  Recent A1c 6.5%-continue current low dose of metformin  Discussed his interstitial pulmonary disease.  Patient was not very clear on why he has this, explained we think it comes from damage due to COVID-19 He continues to use oxygen with success, follows up with pulmonology on a regular basis  I asked him to see me in 6 months for follow-up assuming all is well  Signed Lamar Blinks, MD

## 2021-03-26 ENCOUNTER — Ambulatory Visit (INDEPENDENT_AMBULATORY_CARE_PROVIDER_SITE_OTHER): Payer: Medicare Other | Admitting: Family Medicine

## 2021-03-26 ENCOUNTER — Encounter: Payer: Self-pay | Admitting: Family Medicine

## 2021-03-26 VITALS — BP 144/70 | HR 69 | Temp 97.5°F | Ht 69.0 in | Wt 150.0 lb

## 2021-03-26 DIAGNOSIS — J849 Interstitial pulmonary disease, unspecified: Secondary | ICD-10-CM

## 2021-03-26 DIAGNOSIS — Z79899 Other long term (current) drug therapy: Secondary | ICD-10-CM

## 2021-03-26 DIAGNOSIS — I10 Essential (primary) hypertension: Secondary | ICD-10-CM

## 2021-03-26 DIAGNOSIS — R2681 Unsteadiness on feet: Secondary | ICD-10-CM | POA: Diagnosis not present

## 2021-03-26 DIAGNOSIS — E119 Type 2 diabetes mellitus without complications: Secondary | ICD-10-CM

## 2021-03-26 NOTE — Patient Instructions (Signed)
It was good to see you today- keep up the good work!   ? ?Please see me in about 6 months assuming all is well  ?

## 2021-04-13 ENCOUNTER — Other Ambulatory Visit: Payer: Self-pay

## 2021-04-13 ENCOUNTER — Telehealth: Payer: Self-pay

## 2021-04-13 MED ORDER — ALBUTEROL SULFATE HFA 108 (90 BASE) MCG/ACT IN AERS: 2.0000 | INHALATION_SPRAY | Freq: Four times a day (QID) | RESPIRATORY_TRACT | 2 refills | Status: DC | PRN

## 2021-04-13 NOTE — Addendum Note (Signed)
Addended by: Lamar Blinks C on: 04/13/2021 12:41 PM ? ? Modules accepted: Orders ? ?

## 2021-04-13 NOTE — Telephone Encounter (Signed)
Patient called requested an albuterol inhaler ?

## 2021-04-21 DIAGNOSIS — U071 COVID-19: Secondary | ICD-10-CM | POA: Diagnosis not present

## 2021-04-23 ENCOUNTER — Other Ambulatory Visit: Payer: Self-pay

## 2021-04-23 ENCOUNTER — Telehealth: Payer: Self-pay | Admitting: Family Medicine

## 2021-04-23 NOTE — Telephone Encounter (Signed)
Pts immunizations line up with [facility]. Do you recommend any another immunizations for him? ?

## 2021-04-23 NOTE — Telephone Encounter (Signed)
The patient is calling to ask if he is updated for his Covid Vaccines? ?He is aware of having the first 2 vaccines and one booster, he wants to know if he needs another? ?

## 2021-04-23 NOTE — Telephone Encounter (Signed)
Left vm to return call.    

## 2021-04-23 NOTE — Patient Outreach (Addendum)
[facility]) Care Management ? ?04/23/2021 ? ?Johnathan [name] ?1932/07/01 ?[id number] ? ? ?Telephone Assessment ? ? ? ?Successful quarterly outreach call to patient.He reports he is doing fairly well. He did accidentally loose upper tooth this morning while eating bowl of cereal. He has dentist appt on next week already scheduled. He reports he has eye exam later this week as well. Patient continues to remain in his home and independent with ADLs/IADLs. Denies any recent falls. Appetite remains good. Wgt stable. Breathing is managed at present. Patient inquiring about rather or not to get another booster shot COVID before federal emergency ends next month and vaccines no longer free of charge. Encouraged pt to discuss with MD. Denies any RN CM needs or concerns at this time. ? ?Medications Reviewed Today   ? ? Reviewed by Hayden Pedro, RN (Registered Nurse) on 04/23/21 at [facility] List Status: <None>  ? ?Medication Order Taking? Sig Documenting Provider Last Dose Status Informant  ?albuterol (VENTOLIN HFA) 108 (90 Base) MCG/ACT inhaler [id number]  Inhale 2 puffs into the lungs every 6 (six) hours as needed for wheezing or shortness of breath. Copland, Gay Filler, MD  Active   ?amLODipine (NORVASC) 5 MG tablet [id number] No Take 1 tablet (5 mg total) by mouth daily. Copland, Gay Filler, MD Taking Active Self  ?aspirin EC 81 MG tablet [id number] No Take 81 mg by mouth daily. [provider] Taking Active Self  ?Budeson-Glycopyrrol-Formoterol (BREZTRI AEROSPHERE) 160-9-4.8 MCG/ACT AERO [id number] No Inhale 2 puffs into the lungs in the morning and at bedtime. Clayton Bibles, NP Taking Active   ?chlorpheniramine (CHLOR-TRIMETON) 4 MG tablet [id number] No Take 1 tablet (4 mg total) by mouth at bedtime as needed (cough).  ?Patient not taking: Reported on 03/26/2021  ? Clayton Bibles, NP Not Taking Active   ?Cyanocobalamin (B-12) 1000 MCG CAPS [id number] No Take 1 capsule by mouth daily.  [provider] Taking Active Self  ?Flaxseed, Linseed, (FLAXSEED OIL) 1200 MG CAPS [id number] No Take 1 capsule by mouth daily. [provider] Taking Active Self  ?furosemide (LASIX) 20 MG tablet [id number] No Take 1 tablet (20 mg total) by mouth daily as needed for edema. Marrian Salvage, FNP Taking Active Self  ?Ginkgo Biloba EXTR [id number] No Take 1 tablet by mouth 2 (two) times daily. [provider] Taking Active Self  ?guaiFENesin (MUCINEX) 600 MG 12 hr tablet [id number] No Take 2 tablets (1,200 mg total) by mouth 2 (two) times daily.  ?Patient not taking: Reported on 03/26/2021  ? Terrilyn Saver, NP Not Taking Active   ?ketoconazole (NIZORAL) 2 % shampoo [id number] No APPLY TO AFFECTED AREA(S) TOPICALLY TWO TIMES PER WEEK  ?Patient taking differently: 1 application. See admin instructions. Take every time he takes a shower  ? Copland, Gay Filler, MD Taking Active Self  ?loratadine (CLARITIN) 10 MG tablet [id number] No TAKE 1 TABLET EVERY DAY Copland, Gay Filler, MD Taking Active Self  ?meloxicam (MOBIC) 15 MG tablet [id number] No TAKE 1 TABLET EVERY DAY AS NEEDED FOR JOINT PAIN  ?Patient taking differently: Take 15 mg by mouth daily.  ? Copland, Gay Filler, MD Taking Active Self  ?metFORMIN (GLUCOPHAGE) 500 MG tablet [id number] No Take 0.5 tablets (250 mg total) by mouth daily. Copland, Gay Filler, MD Taking Active Self  ?Misc Natural Products (HIMALAYAN GOJI PO) [id number] No Take 1 tablet by mouth in the morning and at bedtime.  [provider] Taking Active Self  ?Multiple Vitamin (MULTIVITAMIN  WITH MINERALS) TABS tablet [id number] No Take 1 tablet by mouth daily. [provider] Taking Active Self  ?nystatin (MYCOSTATIN) 100000 UNIT/ML suspension [id number] No Take 5 mLs (500,000 Units total) by mouth 4 (four) times daily.  ?Patient not taking: Reported on 03/26/2021  ? Clayton Bibles, NP Not Taking Active   ?omeprazole (PRILOSEC) 20 MG capsule [id number] No Take 1  capsule (20 mg total) by mouth 2 (two) times daily. Copland, Gay Filler, MD Taking Active Self  ?OXYGEN [id number] No Inhale 2 L into the lungs continuous. [provider] Taking Active Self  ?pravastatin (PRAVACHOL) 40 MG tablet [id number] No Take 0.5 tablets (20 mg total) by mouth daily. Copland, Gay Filler, MD Taking Active Self  ?primidone (MYSOLINE) 50 MG tablet [id number] No Take 1 tablet (50 mg total) by mouth 2 (two) times daily. Copland, Gay Filler, MD Taking Active Self  ?sildenafil (VIAGRA) 50 MG tablet [id number] No TAKE 1 TABLET BY MOUTH DAILY AS NEEDED FOR ERECTILE DYSFUNCTION. Copland, Gay Filler, MD Taking Active Self  ?Spacer/Aero-Holding Chambers (AEROCHAMBER MV) inhaler [id number] No Use as instructed Cobb, Karie Schwalbe, NP Taking Active   ?terazosin (HYTRIN) 1 MG capsule [id number] No Take 1 capsule (1 mg total) by mouth 2 (two) times daily. Copland, Gay Filler, MD Taking Active Self  ?Tetrahydrozoline HCl (VISINE OP) [id number] No Place 1 drop into both eyes 2 (two) times daily. [provider] Taking Active Self  ?triamcinolone cream (KENALOG) 0.1 % [id number] No APPLY 1 APPLICATION TOPICALLY 2 (TWO) TIMES DAILY. Copland, Gay Filler, MD Taking Active Self  ? ?  ?  ? ?  ?  ? ? ?  01/19/2021  ? 11:55 AM 01/23/2021  ?  9:43 AM 01/30/2021  ?  1:35 PM 03/26/2021  ?  2:00 PM 04/23/2021  ?  9:37 AM  ?Fall Risk  ?Falls in the past year? 0 0 0 0 0  ?Was there an injury with Fall? 0 0 0 0 0  ?Fall Risk Category Calculator 0 0 0 0 0  ?Fall Risk Category Low Low Low Low Low  ?Patient Fall Risk Level Low fall risk Low fall risk Low fall risk Low fall risk Low fall risk  ?Patient at Risk for Falls Due to  Medication side effect No Fall Risks No Fall Risks Medication side effect  ?Fall risk Follow up Falls prevention discussed Falls prevention discussed;Falls evaluation completed Falls evaluation completed Falls evaluation completed Falls evaluation completed;Education provided  ?  ? ?  04/23/2021  ?  9:32 AM  01/30/2021  ?  1:35 PM 01/19/2021  ? 11:58 AM 10/10/2020  ?  1:37 PM 08/04/2020  ?  2:17 PM  ?Depression screen PHQ 2/9  ?Decreased Interest 0 0 0 0   ?Down, Depressed, Hopeless 0 0 0 0   ?PHQ - 2 Score 0 0 0 0   ?Altered sleeping    0 0  ?Tired, decreased energy    0 0  ?Change in appetite    0 0  ?Feeling bad or failure about yourself     0 0  ?Trouble concentrating    0 0  ?Moving slowly or fidgety/restless    0 0  ?Suicidal thoughts    0 0  ?PHQ-9 Score    0   ?Difficult doing work/chores    Not difficult at all   ?  ?SDOH Screenings  ? ?Alcohol Screen: Low Risk   ? Last Alcohol Screening Score (AUDIT): 4  ?Depression (PHQ2-9): Low Risk   ?  PHQ-2 Score: 0  ?Financial Resource Strain: Low Risk   ? Difficulty of Paying Living Expenses: Not hard at all  ?Food Insecurity: No Food Insecurity  ? Worried About Charity fundraiser in the Last Year: Never true  ? Ran Out of Food in the Last Year: Never true  ?Housing: Low Risk   ? Last Housing Risk Score: 0  ?Physical Activity: Inactive  ? Days of Exercise per Week: 0 days  ? Minutes of Exercise per Session: 0 min  ?Social Connections: Moderately Isolated  ? Frequency of Communication with Friends and Family: More than three times a week  ? Frequency of Social Gatherings with Friends and Family: Once a week  ? Attends Religious Services: 1 to 4 times per year  ? Active Member of Clubs or Organizations: No  ? Attends Archivist Meetings: Never  ? Marital Status: Never married  ?Stress: No Stress Concern Present  ? Feeling of Stress : Not at all  ?Tobacco Use: Medium Risk  ? Smoking Tobacco Use: Former  ? Smokeless Tobacco Use: Never  ? Passive Exposure: Not on file  ?Transportation Needs: No Transportation Needs  ? Lack of Transportation (Medical): No  ? Lack of Transportation (Non-Medical): No  ?  ?Care Plan : RN Care Manager POC  ?Updates made by Hayden Pedro, RN since 04/23/2021 12:00 AM  ?  ? ?Problem: Chronic Disease Mgmt of Chronic Condition-COPD    ?Priority: High  ?  ? ?Long-Range Goal: Development of POC for Mgmt of Chronic Condition-COPD   ?Start Date: 01/23/2021  ?Expected End Date: 01/23/2022  ?This Visit's Progress: On track  ?Priority: High  ?Note:

## 2021-04-24 NOTE — Telephone Encounter (Signed)
Patient state he had a shingles shot 10 years ago and it's not sure if he needs another one. Please advise.  ?

## 2021-04-24 NOTE — Telephone Encounter (Signed)
Disregard prev message- will call to inform pt about updating to Shingrix.  ?

## 2021-04-24 NOTE — Telephone Encounter (Signed)
Left vm to return call.  ? ?Previous Shingles shot is out of date. "Shingrix" is most up to date version of Shingles shot, 2 doses given 2 to 6 months apart and 99% effective.  ? ?Can schedule if pt would like to update.  ?

## 2021-04-24 NOTE — Telephone Encounter (Signed)
Labs have been faxed.

## 2021-04-27 DIAGNOSIS — H2513 Age-related nuclear cataract, bilateral: Secondary | ICD-10-CM | POA: Diagnosis not present

## 2021-05-07 ENCOUNTER — Other Ambulatory Visit: Payer: Self-pay | Admitting: Nurse Practitioner

## 2021-05-07 DIAGNOSIS — J449 Chronic obstructive pulmonary disease, unspecified: Secondary | ICD-10-CM

## 2021-05-21 DIAGNOSIS — U071 COVID-19: Secondary | ICD-10-CM | POA: Diagnosis not present

## 2021-05-25 ENCOUNTER — Telehealth: Payer: Self-pay | Admitting: Family Medicine

## 2021-05-25 NOTE — Telephone Encounter (Signed)
I do not see this on his med list from either you or Pulm. Please advise.  ?

## 2021-05-25 NOTE — Telephone Encounter (Signed)
Pt is trying to verify that Dr.Copland has prescribed trelegy for him. Stated he was sent a 22 supply in the mail. He does like this rx better but just wanted the okay from dr. Lorelei Pont. Did not see current rx in pt chart. Please advise.  ?

## 2021-05-25 NOTE — Telephone Encounter (Signed)
Pt called back and stated he was mistaken. He was sent breztri. He would like to discuss with Dr.Copland about trying trelegy though, or if there is an inhaler option for breztri. He feels that this medication frustrates him because it is hard to use and he has to do it twice a day. He would like if Dr.Copland could call and discuss a different option. Please advise.  ?

## 2021-05-26 ENCOUNTER — Encounter: Payer: Self-pay | Admitting: Family Medicine

## 2021-05-26 NOTE — Telephone Encounter (Signed)
LMOM- this med is rx by pulmonology. Please give them a call to get advice on this issue ?

## 2021-05-28 ENCOUNTER — Telehealth: Payer: Self-pay | Admitting: Family Medicine

## 2021-05-28 ENCOUNTER — Telehealth: Payer: Self-pay | Admitting: Nurse Practitioner

## 2021-05-28 NOTE — Telephone Encounter (Signed)
See below

## 2021-05-28 NOTE — Telephone Encounter (Signed)
Called and spoke with pt letting him know the difference between Trelegy and Breztri. After speaking with pt, pt wants to know if he can switch back to Trelegy from [place] as he liked Trelegy better. ? ? ?Joellen Jersey, please advise on this for pt. Please route this back to triage pool. ?

## 2021-05-28 NOTE — Telephone Encounter (Signed)
I switched him due to the development of thrush and possible upper airway irritation. I would prefer he stay on the [facility] but if he wants to try back on the Trelegy, we can. He just needs to monitor closely for thrush and worsening of his cough. Thanks.

## 2021-05-28 NOTE — Telephone Encounter (Signed)
Pt called wondering if [employer] CBD Gummies would be good to add to his regiment for COPD. Please Advise.  ?

## 2021-05-29 NOTE — Telephone Encounter (Signed)
Called and spoke with patient. He verbalized understanding. He has plenty of Trelegy at home so he does not need a refill. Advised him to call us back if anything changes.  ? ? ?

## 2021-05-29 NOTE — Telephone Encounter (Signed)
Per PCP, advised patient gummies may cause sedation and unknown reactions.  ?Patient voiced understanding, states if he decides to take gummy it will be at night and will let PCP know.  ?

## 2021-05-29 NOTE — Telephone Encounter (Signed)
Pt would like an explanation as to why this is a bad idea.  ?

## 2021-06-05 NOTE — Progress Notes (Addendum)
[facility] at [facility] [address], [place], [place] [address] [phone]  Date:  06/07/2021   Name:  Johnathan [name]   DOB:  12-21-1932   MRN:  [id number]  PCP:  Darreld Mclean, MD    Chief Complaint: Dental Pain (X several weeks. Pt has new denters.Marland Kitchen )   History of Present Illness:  Johnathan [name] is a 88 y.o. very pleasant male patient who presents with the following:  Patient seen today with concern of tooth pain Most recent visit with myself was in March History of emphysema, diabetes, COPD, hypertension, former smoker Currently been doing excellently for age until he got [place] in February 2022; he subsequently developed pulmonary fibrosis and is still using oxygen.  Second dose Shingrix Lab Results  Component Value Date   HGBA1C 6.5 02/12/2021  Can update A1c today if he would like Pt arrived late today as his GPS seems to have malfunctioned and gotten him lost - he was nearly an hour late He notes his new dentures have caused a sore in the roof of his mouth- they made an adjustment to his dentures and they seem to be fitting him better - this has been a process for him   He got the new dentures about 2 weeks ago- this has been a period of adjustment He has not been able to eat as much due to this and thinks he has lost a few labs  Wt Readings from Last 3 Encounters:  06/07/21 151 lb (68.5 kg)  03/26/21 150 lb (68 kg)  03/19/21 150 lb (68 kg)      Patient Active Problem List   Diagnosis Date Noted   Interstitial pulmonary disease ([place]) 03/19/2021   Hypertension 03/19/2021   Oral candidiasis 03/05/2021   Chronic respiratory failure with hypoxia ([place]) 03/05/2021   Trigger finger, right ring finger 02/13/2021   COPD with chronic bronchitis and emphysema ([place]) 04/26/2020   COVID-19 02/25/2020   Shingles 11/14/2019   Raynaud's syndrome 11/14/2019   Skin cancer 11/14/2019   Left inguinal hernia s/p lap repair w  mesh 05/06/2019 05/06/2019   Recurrent right inguinal hernia s/p lap repair w mesh 05/06/2019 03/01/2019   Diabetes mellitus ([place]) 03/01/2019   Pneumonia 04/21/2014   COPD with acute exacerbation ([place]) 04/21/2014    Past Medical History:  Diagnosis Date   Anal fissure    COPD (chronic obstructive pulmonary disease) ([place])    Diverticulosis    DM type 2 (diabetes mellitus, type 2) ([place])    GERD (gastroesophageal reflux disease)    uses prilosec    Hypertension    Pneumonia 2018   Raynaud's disease    Shortness of breath dyspnea    WITH EXERTION   Sinusitis    Tremors of nervous system     Past Surgical History:  Procedure Laterality Date   COLONOSCOPY  03/27/2005   ESOPHAGOGASTRODUODENOSCOPY (EGD) WITH PROPOFOL N/A 02/08/2021   Procedure: ESOPHAGOGASTRODUODENOSCOPY (EGD) WITH PROPOFOL;  Surgeon: Milus Banister, MD;  Location: [facility];  Service: Endoscopy;  Laterality: N/A;   HERNIA REPAIR     INGUINAL HERNIA REPAIR N/A 05/06/2019   Procedure: LAPAROSCOPIC RIGHT AND LEFT INGUINAL HERNIA(S) REPAIR;  Surgeon: Michael Boston, MD;  Location: [facility];  Service: General;  Laterality: N/A;    Social History   Tobacco Use   Smoking status: Former    Packs/day: 1.00    Years: 45.00  Pack years: 45.00    Types: Cigarettes    Quit date: 01/21/1994    Years since quitting: 27.3   Smokeless tobacco: Never  Substance Use Topics   Alcohol use: Yes    Comment: a drink daily with dinner   Drug use: No    Family History  Problem Relation Age of Onset   Dementia Brother    Diabetes Brother    Heart attack Father    Dementia Father     No Known Allergies  Medication list has been reviewed and updated.  Current Outpatient Medications on File Prior to Visit  Medication Sig Dispense Refill   albuterol (VENTOLIN HFA) 108 (90 Base) MCG/ACT inhaler Inhale 2 puffs into the lungs every 6 (six) hours as needed for wheezing or shortness of breath. 18 g 2    amLODipine (NORVASC) 5 MG tablet Take 1 tablet (5 mg total) by mouth daily. 90 tablet 3   aspirin EC 81 MG tablet Take 81 mg by mouth daily.     BREZTRI AEROSPHERE 160-9-4.8 MCG/ACT AERO INHALE 2 INHALATIONS BY MOUTH  INTO THE LUNGS IN THE MORNING  AND AT BEDTIME 32.1 g 3   chlorpheniramine (CHLOR-TRIMETON) 4 MG tablet Take 1 tablet (4 mg total) by mouth at bedtime as needed (cough). (Patient not taking: Reported on 03/26/2021) 14 tablet 0   Cyanocobalamin (B-12) 1000 MCG CAPS Take 1 capsule by mouth daily.     Flaxseed, Linseed, (FLAXSEED OIL) 1200 MG CAPS Take 1 capsule by mouth daily.     furosemide (LASIX) 20 MG tablet Take 1 tablet (20 mg total) by mouth daily as needed for edema. 30 tablet 0   Ginkgo Biloba EXTR Take 1 tablet by mouth 2 (two) times daily.     guaiFENesin (MUCINEX) 600 MG 12 hr tablet Take 2 tablets (1,200 mg total) by mouth 2 (two) times daily. (Patient not taking: Reported on 03/26/2021) 30 tablet 2   ketoconazole (NIZORAL) 2 % shampoo APPLY TO AFFECTED AREA(S) TOPICALLY TWO TIMES PER WEEK (Patient taking differently: 1 application. See admin instructions. Take every time he takes a shower) 120 mL 0   loratadine (CLARITIN) 10 MG tablet TAKE 1 TABLET EVERY DAY 90 tablet 1   meloxicam (MOBIC) 15 MG tablet TAKE 1 TABLET EVERY DAY AS NEEDED FOR JOINT PAIN (Patient taking differently: Take 15 mg by mouth daily.) 90 tablet 0   metFORMIN (GLUCOPHAGE) 500 MG tablet Take 0.5 tablets (250 mg total) by mouth daily. 90 tablet 3   Misc Natural Products (HIMALAYAN GOJI PO) Take 1 tablet by mouth in the morning and at bedtime.      Multiple Vitamin (MULTIVITAMIN WITH MINERALS) TABS tablet Take 1 tablet by mouth daily.     nystatin (MYCOSTATIN) 100000 UNIT/ML suspension Take 5 mLs (500,000 Units total) by mouth 4 (four) times daily. (Patient not taking: Reported on 03/26/2021) 200 mL 0   omeprazole (PRILOSEC) 20 MG capsule Take 1 capsule (20 mg total) by mouth 2 (two) times daily. 180 capsule 3    OXYGEN Inhale 2 L into the lungs continuous.     pravastatin (PRAVACHOL) 40 MG tablet Take 0.5 tablets (20 mg total) by mouth daily. 45 tablet 3   primidone (MYSOLINE) 50 MG tablet Take 1 tablet (50 mg total) by mouth 2 (two) times daily. 180 tablet 3   sildenafil (VIAGRA) 50 MG tablet TAKE 1 TABLET BY MOUTH DAILY AS NEEDED FOR ERECTILE DYSFUNCTION. 10 tablet 3   Spacer/Aero-Holding Chambers (AEROCHAMBER MV) inhaler Use  as instructed 1 each 0   terazosin (HYTRIN) 1 MG capsule Take 1 capsule (1 mg total) by mouth 2 (two) times daily. 180 capsule 3   Tetrahydrozoline HCl (VISINE OP) Place 1 drop into both eyes 2 (two) times daily.     triamcinolone cream (KENALOG) 0.1 % APPLY 1 APPLICATION TOPICALLY 2 (TWO) TIMES DAILY. 80 g 0   No current facility-administered medications on file prior to visit.    Review of Systems:  As per HPI- otherwise negative.   Physical Examination: Vitals:   06/07/21 1409  BP: 128/80  Pulse: 62  Resp: 18  Temp: 98.2 F (36.8 C)  SpO2: 91%   Vitals:   06/07/21 1409  Weight: 151 lb (68.5 kg)  Height: '5\' 9"'$  (1.753 m)   Body mass index is 22.3 kg/m. Ideal Body Weight: Weight in (lb) to have BMI = 25: 168.9  GEN: no acute distress. Normal weight, looks well  HEENT: Atraumatic, Normocephalic.  There is a small sore area on the hard palate which pt states is improving  Ears and Nose: No external deformity. CV: RRR, No M/G/R. No JVD. No thrill. No extra heart sounds. PULM: CTA B, no wheezes, crackles, rhonchi. No retractions. No resp. distress. No accessory muscle use. EXTR: No c/c/e PSYCH: Normally interactive. Conversant.    Assessment and Plan: Full dentures  Type 2 diabetes mellitus without complication, without long-term current use of insulin ([place]) - Plan: Basic metabolic panel, Hemoglobin A1c Advised pt that I defer to his dentist in fitting his dentures.  However if he is having a lot of soreness remove dentures until they can be  adjusted Follow-up on well controlled diabetes today Pt has some questions about his inhalers again today- I advised him to please consult with pulmonology who is rx these medications for him Otherwise please see me in September as planned   Signed Lamar Blinks, MD  Received labs as below, message to pt  Results for orders placed or performed in visit on 51/70/01  Basic metabolic panel  Result Value Ref Range   Sodium 139 135 - 145 mEq/L   Potassium 4.1 3.5 - 5.1 mEq/L   Chloride 100 96 - 112 mEq/L   CO2 31 19 - 32 mEq/L   Glucose, Bld 169 (H) 70 - 99 mg/dL   BUN 19 6 - 23 mg/dL   Creatinine, Ser 1.17 0.40 - 1.50 mg/dL   GFR 56.39 (L) >60.00 mL/min   Calcium 9.9 8.4 - 10.5 mg/dL  Hemoglobin A1c  Result Value Ref Range   Hgb A1c MFr Bld 6.3 4.6 - 6.5 %

## 2021-06-07 ENCOUNTER — Ambulatory Visit (INDEPENDENT_AMBULATORY_CARE_PROVIDER_SITE_OTHER): Payer: Medicare Other | Admitting: Family Medicine

## 2021-06-07 VITALS — BP 128/80 | HR 62 | Temp 98.2°F | Resp 18 | Ht 69.0 in | Wt 151.0 lb

## 2021-06-07 DIAGNOSIS — Z972 Presence of dental prosthetic device (complete) (partial): Secondary | ICD-10-CM

## 2021-06-07 DIAGNOSIS — K08109 Complete loss of teeth, unspecified cause, unspecified class: Secondary | ICD-10-CM

## 2021-06-07 DIAGNOSIS — E119 Type 2 diabetes mellitus without complications: Secondary | ICD-10-CM | POA: Diagnosis not present

## 2021-06-07 NOTE — Patient Instructions (Signed)
It was good to see you today- your dentures look good!   Take a break from them if your dentures are hurting your mouth I will be in touch with your labs asap  Assuming all is well please see me in 4-6 months

## 2021-06-08 ENCOUNTER — Telehealth: Payer: Self-pay | Admitting: Nurse Practitioner

## 2021-06-08 ENCOUNTER — Encounter: Payer: Self-pay | Admitting: Family Medicine

## 2021-06-08 LAB — BASIC METABOLIC PANEL
BUN: 19 mg/dL (ref 6–23)
CO2: 31 mEq/L (ref 19–32)
Calcium: 9.9 mg/dL (ref 8.4–10.5)
Chloride: 100 mEq/L (ref 96–112)
Creatinine, Ser: 1.17 mg/dL (ref 0.40–1.50)
GFR: 56.39 mL/min — ABNORMAL LOW (ref >60.00)
Glucose, Bld: 169 mg/dL — ABNORMAL HIGH (ref 70–99)
Potassium: 4.1 mEq/L (ref 3.5–5.1)
Sodium: 139 mEq/L (ref 135–145)

## 2021-06-08 LAB — HEMOGLOBIN A1C: Hgb A1c MFr Bld: 6.3 % (ref 4.6–6.5)

## 2021-06-08 MED ORDER — TRELEGY ELLIPTA 100-62.5-25 MCG/ACT IN AEPB: 1.0000 | INHALATION_SPRAY | Freq: Every day | RESPIRATORY_TRACT | 0 refills | Status: DC

## 2021-06-08 NOTE — Telephone Encounter (Signed)
Called and spoke with patient. Patient stated that the breztri did not help him so he switched back to the trelegy 100. Patient stated that he was almost out of trelegy. Patient verified pharmacy.   Refill was sent in. Nothing further needed.

## 2021-06-14 ENCOUNTER — Encounter: Payer: Self-pay | Admitting: Pulmonary Disease

## 2021-06-14 ENCOUNTER — Ambulatory Visit (INDEPENDENT_AMBULATORY_CARE_PROVIDER_SITE_OTHER): Payer: Medicare Other | Admitting: Pulmonary Disease

## 2021-06-14 VITALS — BP 126/62 | HR 67 | Temp 98.0°F | Ht 69.0 in | Wt 145.0 lb

## 2021-06-14 DIAGNOSIS — J849 Interstitial pulmonary disease, unspecified: Secondary | ICD-10-CM

## 2021-06-14 DIAGNOSIS — U099 Post covid-19 condition, unspecified: Secondary | ICD-10-CM | POA: Diagnosis not present

## 2021-06-14 DIAGNOSIS — J449 Chronic obstructive pulmonary disease, unspecified: Secondary | ICD-10-CM

## 2021-06-14 NOTE — Patient Instructions (Signed)
I'm glad you're doing well with your breathing continue trelegy as prescribed I will order high-resolution CT for evaluation of interstitial lung disease follow-up in clinic in six months

## 2021-06-14 NOTE — Progress Notes (Signed)
Johnathan [name]    [id number]    1932/11/08  Primary Care Physician:Copland, Gay Filler, MD  Referring Physician: Darreld Mclean, [address],  [place] [address]  Chief complaint: Follow-up for asthma, COPD, post COVID-13  HPI: 88 year old ex-smoker with history of emphysema [no PFTs on record], Raynaud's syndrome Maintained on Symbicort for many years  Hospitalized for COVID-19 in early February 2022 in spite of getting the booster vaccine. He was treated with IV remdesivir, steroids, discharged on supplemental oxygen Post discharge he continues to have persistent dyspnea on exertion, hypoxia whenever he stops oxygen.  Follow-up chest x-ray shows persistent lung infiltrate and he has been referred to pulmonary for further evaluation Advair changed to Trelegy in 2021.  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  No feather pillows or comforter. Smoking history: 45-pack-year smoker.  Quit in the 1990s Travel history: No significant travel history Relevant family history: Father had COPD.  He was a smoker.  Interim history: He was tried on Breztri at last visit but do not like it so is back on trelegy Continues on supplemental oxygen  Outpatient Encounter Medications as of 06/14/2021  Medication Sig   albuterol (VENTOLIN HFA) 108 (90 Base) MCG/ACT inhaler Inhale 2 puffs into the lungs every 6 (six) hours as needed for wheezing or shortness of breath.   aspirin EC 81 MG tablet Take 81 mg by mouth daily.   chlorpheniramine (CHLOR-TRIMETON) 4 MG tablet Take 1 tablet (4 mg total) by mouth at bedtime as needed (cough).   Cyanocobalamin (B-12) 1000 MCG CAPS Take 1 capsule by mouth daily.   Flaxseed, Linseed, (FLAXSEED OIL) 1200 MG CAPS Take 1 capsule by mouth daily.   Fluticasone-Umeclidin-Vilant (TRELEGY ELLIPTA)  100-62.5-25 MCG/ACT AEPB Inhale 1 puff into the lungs daily.   furosemide (LASIX) 20 MG tablet Take 1 tablet (20 mg total) by mouth daily as needed for edema.   Ginkgo Biloba EXTR Take 1 tablet by mouth 2 (two) times daily.   guaiFENesin (MUCINEX) 600 MG 12 hr tablet Take 2 tablets (1,200 mg total) by mouth 2 (two) times daily.   ketoconazole (NIZORAL) 2 % shampoo APPLY TO AFFECTED AREA(S) TOPICALLY TWO TIMES PER WEEK (Patient taking differently: 1 application. See admin instructions. Take every time he takes a shower)   loratadine (CLARITIN) 10 MG tablet TAKE 1 TABLET EVERY DAY   meloxicam (MOBIC) 15 MG tablet TAKE 1 TABLET EVERY DAY AS NEEDED FOR JOINT PAIN (Patient taking differently: Take 15 mg by mouth daily.)   metFORMIN (GLUCOPHAGE) 500 MG tablet Take 0.5 tablets (250 mg total) by mouth daily.   Misc Natural Products (HIMALAYAN GOJI PO) Take 1 tablet by mouth in the morning and at bedtime.    Multiple Vitamin (MULTIVITAMIN WITH MINERALS) TABS tablet Take 1 tablet by mouth daily.   omeprazole (PRILOSEC) 20 MG capsule Take 1 capsule (20 mg total) by mouth 2 (two) times daily.   OXYGEN Inhale 2 L into the lungs continuous.   pravastatin (PRAVACHOL) 40 MG tablet Take 0.5 tablets (20 mg total) by mouth daily.   primidone (MYSOLINE) 50 MG tablet Take 1 tablet (50 mg total) by mouth 2 (two) times daily.   sildenafil (VIAGRA) 50 MG tablet TAKE 1 TABLET BY MOUTH DAILY AS NEEDED FOR ERECTILE DYSFUNCTION.   terazosin (HYTRIN) 1  MG capsule Take 1 capsule (1 mg total) by mouth 2 (two) times daily.   Tetrahydrozoline HCl (VISINE OP) Place 1 drop into both eyes 2 (two) times daily.   triamcinolone cream (KENALOG) 0.1 % APPLY 1 APPLICATION TOPICALLY 2 (TWO) TIMES DAILY.   amLODipine (NORVASC) 5 MG tablet Take 1 tablet (5 mg total) by mouth daily.   [DISCONTINUED] BREZTRI AEROSPHERE 160-9-4.8 MCG/ACT AERO INHALE 2 INHALATIONS BY MOUTH  INTO THE LUNGS IN THE MORNING  AND AT BEDTIME   [DISCONTINUED] nystatin  (MYCOSTATIN) 100000 UNIT/ML suspension Take 5 mLs (500,000 Units total) by mouth 4 (four) times daily. (Patient not taking: Reported on 03/26/2021)   [DISCONTINUED] Spacer/Aero-Holding Chambers (AEROCHAMBER MV) inhaler Use as instructed   No facility-administered encounter medications on file as of 06/14/2021.   Physical Exam: Blood pressure 140/66, pulse 86, temperature 97.9 F (36.6 C), temperature source Oral, height '5\' 9"'$  (1.753 m), weight 159 lb 6.4 oz (72.3 kg), SpO2 94 %. Gen:      No acute distress HEENT:  EOMI, sclera anicteric Neck:     No masses; no thyromegaly Lungs:    Clear to auscultation bilaterally; normal respiratory effort CV:         Regular rate and rhythm; no murmurs Abd:      + bowel sounds; soft, non-tender; no palpable masses, no distension Ext:    No edema; adequate peripheral perfusion Skin:      Warm and dry; no rash Neuro: alert and oriented x 3 Psych: normal mood and affect   Data Reviewed: Imaging: Chest x-ray 02/20/2020-hyperinflation, no acute process Chest x-ray 02/25/2020-emphysema, mild left base opacity Chest x-ray 03/30/2020-bilateral infiltrates. CT high-resolution 04/28/2020- emphysema, peripheral sublenticular groundglass densities, calcified granuloma.  Indeterminate pattern I have reviewed the images personally.  PFTs: 05/26/2020 FVC 2.41 [6%], FEV1 1.23 [48%], F/F 51, TLC 5.03 [73%], DLCO 7.92 [34%] Severe obstruction, severe diffusion defect, mild restriction  Labs: CBC 02/29/2020-WBC 5.3, eos 0% CBC 05/30/2020-WBC 5, eos 7.6%, absolute eosinophil count 380 IgE 04/26/2020-858  ANA, CCP, rheumatoid factor 04/26/2020- Negative  Assessment:  Asthma, COPD overlap syndrome PFTs reviewed with severe obstruction. Doing better with Trelegy instead of Breztri Continue supplemental oxygen.  He did desat on exertion today and will need to continue it for now though he is eager to get off oxygen  Post COVID-19 He had been asymptomatic and active prior to  COVID-19 but now short of breath, requiring supplemental oxygen CT shows indeterminate pattern interstitial lung disease. Reorder CT to evaluate ILD  Will benefit from exercise therapy and weight loss Finished pulmonary rehab Continue supplemental oxygen for now  Raynaud's syndrome He has history of Raynaud's syndrome and is taking sildenafil ANA is negative.  Plan/Recommendations: Continue Trelegy HRCT  Marshell Garfinkel MD [facility] 06/14/2021, 3:33 PM  CC: Copland, Gay Filler, MD

## 2021-06-21 DIAGNOSIS — U071 COVID-19: Secondary | ICD-10-CM | POA: Diagnosis not present

## 2021-06-26 ENCOUNTER — Telehealth: Payer: Self-pay | Admitting: Family Medicine

## 2021-06-26 NOTE — Telephone Encounter (Signed)
Pt has been scheduled.  °

## 2021-06-26 NOTE — Telephone Encounter (Signed)
Pt stated he would like to start taking tramadol again. Please advise.    [address], [address]   [place], [place] [address]  Phone:  [phone]  Fax:  [phone]

## 2021-06-26 NOTE — Telephone Encounter (Signed)
Okay to restart Tramadol?

## 2021-06-26 NOTE — Telephone Encounter (Signed)
Pt would like for someone to call him once rx is refilled and sent to pharmacy

## 2021-06-27 ENCOUNTER — Ambulatory Visit (HOSPITAL_BASED_OUTPATIENT_CLINIC_OR_DEPARTMENT_OTHER)
Admission: RE | Admit: 2021-06-27 | Discharge: 2021-06-27 | Disposition: A | Discharge status: Home / Self Care | Payer: Medicare Other | Source: Ambulatory Visit | Attending: Family Medicine | Admitting: Family Medicine

## 2021-06-27 ENCOUNTER — Telehealth (INDEPENDENT_AMBULATORY_CARE_PROVIDER_SITE_OTHER): Payer: Medicare Other | Admitting: Family Medicine

## 2021-06-27 VITALS — HR 67

## 2021-06-27 DIAGNOSIS — M25551 Pain in right hip: Secondary | ICD-10-CM | POA: Diagnosis not present

## 2021-06-27 DIAGNOSIS — M65341 Trigger finger, right ring finger: Secondary | ICD-10-CM

## 2021-06-27 MED ORDER — TRAMADOL HCL 50 MG PO TABS: 50.0000 mg | ORAL_TABLET | Freq: Two times a day (BID) | ORAL | 0 refills | Status: DC | PRN

## 2021-06-27 NOTE — Progress Notes (Signed)
[facility] at [facility] [address], [place], [place] [address] [phone]  Date:  06/27/2021   Name:  Johnathan [name]   DOB:  April 04, 1932   MRN:  [id number]  PCP:  Darreld Mclean, MD    Chief Complaint: No chief complaint on file.   History of Present Illness:  Johnathan [name] is a 88 y.o. very pleasant male patient who presents with the following:  Virtual visit today to discuss tramadol use Patient location is home, my location is office.  Patient identity confirmed with 2 factors, he gives consent for a virtual visit today The patient and myself are present on the call  Pt last seen by myself last month History of emphysema, diabetes, COPD, hypertension, former smoker Currently been doing excellently for age until he got COVID in February 2022; he subsequently developed pulmonary fibrosis and is still using oxygen.  He had contacted me requesting to go back on tramadol- however I had never rx this medication, he was getting it from his prior PCP in the past, so requested this meeting   Pt notes he has used tramadol on very rare occasion over the last couple of years Right now his right hip is bothering him as of the last week or so He is not aware of any injury, but it can be painful with walking.  He used his last tramadol yesterday  No recent falls He also has a trigger finger on the right ring finger that he would like looked at  Patient Active Problem List   Diagnosis Date Noted   Interstitial pulmonary disease ([place]) 03/19/2021   Hypertension 03/19/2021   Oral candidiasis 03/05/2021   Chronic respiratory failure with hypoxia ([place]) 03/05/2021   Trigger finger, right ring finger 02/13/2021   COPD with chronic bronchitis and emphysema ([place]) 04/26/2020   COVID-19 02/25/2020   Shingles 11/14/2019   Raynaud's syndrome 11/14/2019   Skin cancer 11/14/2019   Left inguinal hernia s/p lap repair w mesh 05/06/2019 05/06/2019    Recurrent right inguinal hernia s/p lap repair w mesh 05/06/2019 03/01/2019   Diabetes mellitus ([place]) 03/01/2019   Pneumonia 04/21/2014   COPD with acute exacerbation ([place]) 04/21/2014    Past Medical History:  Diagnosis Date   Anal fissure    COPD (chronic obstructive pulmonary disease) ([place])    Diverticulosis    DM type 2 (diabetes mellitus, type 2) ([facility])    GERD (gastroesophageal reflux disease)    uses prilosec    Hypertension    Pneumonia 2018   Raynaud's disease    Shortness of breath dyspnea    WITH EXERTION   Sinusitis    Tremors of nervous system     Past Surgical History:  Procedure Laterality Date   COLONOSCOPY  03/27/2005   ESOPHAGOGASTRODUODENOSCOPY (EGD) WITH PROPOFOL N/A 02/08/2021   Procedure: ESOPHAGOGASTRODUODENOSCOPY (EGD) WITH PROPOFOL;  Surgeon: Milus Banister, MD;  Location: [facility];  Service: Endoscopy;  Laterality: N/A;   HERNIA REPAIR     INGUINAL HERNIA REPAIR N/A 05/06/2019   Procedure: LAPAROSCOPIC RIGHT AND LEFT INGUINAL HERNIA(S) REPAIR;  Surgeon: Michael Boston, MD;  Location: [facility];  Service: General;  Laterality: N/A;    Social History   Tobacco Use   Smoking status: Former    Packs/day: 1.00    Years: 45.00    Pack years: 45.00    Types: Cigarettes    Quit date: 01/21/1994  Years since quitting: 27.4   Smokeless tobacco: Never  Substance Use Topics   Alcohol use: Yes    Comment: a drink daily with dinner   Drug use: No    Family History  Problem Relation Age of Onset   Dementia Brother    Diabetes Brother    Heart attack Father    Dementia Father     No Known Allergies  Medication list has been reviewed and updated.  Current Outpatient Medications on File Prior to Visit  Medication Sig Dispense Refill   albuterol (VENTOLIN HFA) 108 (90 Base) MCG/ACT inhaler Inhale 2 puffs into the lungs every 6 (six) hours as needed for wheezing or shortness of breath. 18 g 2   amLODipine (NORVASC) 5 MG tablet  Take 1 tablet (5 mg total) by mouth daily. 90 tablet 3   aspirin EC 81 MG tablet Take 81 mg by mouth daily.     chlorpheniramine (CHLOR-TRIMETON) 4 MG tablet Take 1 tablet (4 mg total) by mouth at bedtime as needed (cough). 14 tablet 0   Cyanocobalamin (B-12) 1000 MCG CAPS Take 1 capsule by mouth daily.     Flaxseed, Linseed, (FLAXSEED OIL) 1200 MG CAPS Take 1 capsule by mouth daily.     Fluticasone-Umeclidin-Vilant (TRELEGY ELLIPTA) 100-62.5-25 MCG/ACT AEPB Inhale 1 puff into the lungs daily. 60 each 0   furosemide (LASIX) 20 MG tablet Take 1 tablet (20 mg total) by mouth daily as needed for edema. 30 tablet 0   Ginkgo Biloba EXTR Take 1 tablet by mouth 2 (two) times daily.     guaiFENesin (MUCINEX) 600 MG 12 hr tablet Take 2 tablets (1,200 mg total) by mouth 2 (two) times daily. 30 tablet 2   ketoconazole (NIZORAL) 2 % shampoo APPLY TO AFFECTED AREA(S) TOPICALLY TWO TIMES PER WEEK (Patient taking differently: 1 application. See admin instructions. Take every time he takes a shower) 120 mL 0   loratadine (CLARITIN) 10 MG tablet TAKE 1 TABLET EVERY DAY 90 tablet 1   meloxicam (MOBIC) 15 MG tablet TAKE 1 TABLET EVERY DAY AS NEEDED FOR JOINT PAIN (Patient taking differently: Take 15 mg by mouth daily.) 90 tablet 0   metFORMIN (GLUCOPHAGE) 500 MG tablet Take 0.5 tablets (250 mg total) by mouth daily. 90 tablet 3   Misc Natural Products (HIMALAYAN GOJI PO) Take 1 tablet by mouth in the morning and at bedtime.      Multiple Vitamin (MULTIVITAMIN WITH MINERALS) TABS tablet Take 1 tablet by mouth daily.     omeprazole (PRILOSEC) 20 MG capsule Take 1 capsule (20 mg total) by mouth 2 (two) times daily. 180 capsule 3   OXYGEN Inhale 2 L into the lungs continuous.     pravastatin (PRAVACHOL) 40 MG tablet Take 0.5 tablets (20 mg total) by mouth daily. 45 tablet 3   primidone (MYSOLINE) 50 MG tablet Take 1 tablet (50 mg total) by mouth 2 (two) times daily. 180 tablet 3   sildenafil (VIAGRA) 50 MG tablet TAKE 1  TABLET BY MOUTH DAILY AS NEEDED FOR ERECTILE DYSFUNCTION. 10 tablet 3   terazosin (HYTRIN) 1 MG capsule Take 1 capsule (1 mg total) by mouth 2 (two) times daily. 180 capsule 3   Tetrahydrozoline HCl (VISINE OP) Place 1 drop into both eyes 2 (two) times daily.     triamcinolone cream (KENALOG) 0.1 % APPLY 1 APPLICATION TOPICALLY 2 (TWO) TIMES DAILY. 80 g 0   No current facility-administered medications on file prior to visit.    Review  of Systems:  As per HPI- otherwise negative.   Physical Examination: There were no vitals filed for this visit. There were no vitals filed for this visit. There is no height or weight on file to calculate BMI. Ideal Body Weight:    Spoke with patient over the telephone.  He sounds well, his normal self  Assessment and Plan: Right hip pain - Plan: DG Hip Unilat W OR W/O Pelvis 2-3 Views Right, traMADol (ULTRAM) 50 MG tablet  Trigger ring finger of right hand - Plan: Ambulatory referral to Hand Surgery Prescription sent in for tramadol, patient has used test on rare occasion as needed for pain.  Advised him to continue using it sparingly Ordered hip x-rays to evaluate hip pain, most likely osteoarthritis Referral also to hand surgery to evaluate trigger finger  Spoke with patient for 10 minutes today  Signed Lamar Blinks, MD

## 2021-06-30 ENCOUNTER — Encounter: Payer: Self-pay | Admitting: Family Medicine

## 2021-07-02 ENCOUNTER — Telehealth: Payer: Self-pay

## 2021-07-02 NOTE — Telephone Encounter (Signed)
Pt called- his trigger finger is no any better- requesting referral please.

## 2021-07-02 NOTE — Telephone Encounter (Signed)
Referral was placed to [facility]. Are you able to see where this stands?

## 2021-07-03 NOTE — Telephone Encounter (Signed)
Pt aware and voices understanding.   

## 2021-07-11 ENCOUNTER — Ambulatory Visit
Admission: RE | Admit: 2021-07-11 | Discharge: 2021-07-11 | Disposition: A | Discharge status: Home / Self Care | Payer: Medicare Other | Source: Ambulatory Visit | Attending: Pulmonary Disease | Admitting: Pulmonary Disease

## 2021-07-11 DIAGNOSIS — J479 Bronchiectasis, uncomplicated: Secondary | ICD-10-CM | POA: Diagnosis not present

## 2021-07-11 DIAGNOSIS — J8410 Pulmonary fibrosis, unspecified: Secondary | ICD-10-CM | POA: Diagnosis not present

## 2021-07-11 DIAGNOSIS — J432 Centrilobular emphysema: Secondary | ICD-10-CM | POA: Diagnosis not present

## 2021-07-11 DIAGNOSIS — J849 Interstitial pulmonary disease, unspecified: Secondary | ICD-10-CM

## 2021-07-16 ENCOUNTER — Other Ambulatory Visit: Payer: Self-pay

## 2021-07-16 MED ORDER — MELOXICAM 15 MG PO TABS
ORAL_TABLET | ORAL | 0 refills | Status: DC
Start: 2021-07-16 — End: 2021-09-17

## 2021-07-16 MED ORDER — MELOXICAM 15 MG PO TABS: ORAL_TABLET | ORAL | 0 refills | Status: DC

## 2021-07-18 DIAGNOSIS — M65341 Trigger finger, right ring finger: Secondary | ICD-10-CM | POA: Diagnosis not present

## 2021-07-21 DIAGNOSIS — U071 COVID-19: Secondary | ICD-10-CM | POA: Diagnosis not present

## 2021-07-25 ENCOUNTER — Other Ambulatory Visit: Payer: Self-pay

## 2021-07-25 NOTE — Patient Outreach (Addendum)
[facility]) Care Management  07/25/2021  Johnathan [name] October 17, 1932 [id number]   Case Closure   Successful outreach call to patient. Patient pleased to report how well he has been doing. He has been able to wean himself off of oxygen over the past 2wks. He remains very active and independent. Denies any RN CM needs or concerns at this time. Patient has switched to [facility] and Medicaid. Enrolled in [employer] and to be followed by their CM services.    Plan: RN CM will close case at this time.   Enzo Montgomery, RN,BSN,CCM [facility] Telephonic Care Management Coordinator Direct Phone: [phone] Toll Free: [phone] Fax: [phone]

## 2021-07-30 ENCOUNTER — Telehealth: Payer: Self-pay | Admitting: Family Medicine

## 2021-07-30 NOTE — Telephone Encounter (Signed)
Called and LMOM- I am not sure why or how long he has been OOW (may have been from when he had covid) Please give me a bit more detail and I am glad to help!

## 2021-07-30 NOTE — Telephone Encounter (Signed)
Patient states he needs Dr. Lorelei Pont to write him a letter to [facility] [employer] stating he is ok to go back to work. He can come pick it up whenever it's ready. He states he needs it as soon as possible since they will give him trouble if he does not turn it in tomorrow. Advised pt that paperwork usually takes a few days and he stated he understood. Please advise.

## 2021-07-30 NOTE — Telephone Encounter (Signed)
Okay for letter

## 2021-07-31 NOTE — Telephone Encounter (Signed)
Letter is done.  Can you please print it out and call pt to pick it up- TY!

## 2021-07-31 NOTE — Telephone Encounter (Signed)
Pt was out of work for over a year (since January last year) due to Covid- Pneumonia.

## 2021-08-02 ENCOUNTER — Other Ambulatory Visit: Payer: Self-pay | Admitting: *Deleted

## 2021-08-02 DIAGNOSIS — R911 Solitary pulmonary nodule: Secondary | ICD-10-CM

## 2021-08-09 ENCOUNTER — Telehealth: Payer: Self-pay | Admitting: Nurse Practitioner

## 2021-08-09 NOTE — Telephone Encounter (Signed)
I called [place] [employer] [place] and let the CSR Lattie Haw know that the patient no longer needed the [place] and they would accept the containers back and would be mailing out a label to the office so that we could return the [place]. Nothing further needed.

## 2021-08-11 ENCOUNTER — Other Ambulatory Visit: Payer: Self-pay | Admitting: Family Medicine

## 2021-08-21 ENCOUNTER — Ambulatory Visit (INDEPENDENT_AMBULATORY_CARE_PROVIDER_SITE_OTHER): Payer: Medicare Other | Admitting: Family

## 2021-08-21 ENCOUNTER — Encounter: Payer: Self-pay | Admitting: Family

## 2021-08-21 ENCOUNTER — Telehealth: Payer: Self-pay

## 2021-08-21 ENCOUNTER — Ambulatory Visit (HOSPITAL_BASED_OUTPATIENT_CLINIC_OR_DEPARTMENT_OTHER)
Admission: RE | Admit: 2021-08-21 | Discharge: 2021-08-21 | Disposition: A | Discharge status: Home / Self Care | Payer: Medicare Other | Source: Ambulatory Visit | Attending: Family | Admitting: Family

## 2021-08-21 VITALS — BP 94/50 | HR 87 | Resp 20 | Ht 69.0 in | Wt 145.0 lb

## 2021-08-21 DIAGNOSIS — J439 Emphysema, unspecified: Secondary | ICD-10-CM | POA: Diagnosis not present

## 2021-08-21 DIAGNOSIS — J449 Chronic obstructive pulmonary disease, unspecified: Secondary | ICD-10-CM

## 2021-08-21 DIAGNOSIS — J441 Chronic obstructive pulmonary disease with (acute) exacerbation: Secondary | ICD-10-CM

## 2021-08-21 DIAGNOSIS — R059 Cough, unspecified: Secondary | ICD-10-CM | POA: Insufficient documentation

## 2021-08-21 DIAGNOSIS — U071 COVID-19: Secondary | ICD-10-CM | POA: Diagnosis not present

## 2021-08-21 MED ORDER — PREDNISONE 20 MG PO TABS: ORAL_TABLET | ORAL | 0 refills | Status: DC

## 2021-08-21 MED ORDER — DOXYCYCLINE HYCLATE 100 MG PO TABS: 100.0000 mg | ORAL_TABLET | Freq: Two times a day (BID) | ORAL | 0 refills | Status: DC

## 2021-08-21 NOTE — Progress Notes (Signed)
Johnathan [name] is a 88 y.o. male with the following history as recorded in EpicCare:  Patient Active Problem List   Diagnosis Date Noted   Interstitial pulmonary disease ([place]) 03/19/2021   Hypertension 03/19/2021   Oral candidiasis 03/05/2021   Chronic respiratory failure with hypoxia (HCC) 03/05/2021   Trigger finger, right ring finger 02/13/2021   COPD with chronic bronchitis and emphysema ([place]) 04/26/2020   COVID-19 02/25/2020   Shingles 11/14/2019   Raynaud's syndrome 11/14/2019   Skin cancer 11/14/2019   Left inguinal hernia s/p lap repair w mesh 05/06/2019 05/06/2019   Recurrent right inguinal hernia s/p lap repair w mesh 05/06/2019 03/01/2019   Diabetes mellitus ([place]) 03/01/2019   Pneumonia 04/21/2014   COPD with acute exacerbation ([place]) 04/21/2014    Current Outpatient Medications  Medication Sig Dispense Refill   albuterol (VENTOLIN HFA) 108 (90 Base) MCG/ACT inhaler Inhale 2 puffs into the lungs every 6 (six) hours as needed for wheezing or shortness of breath. 18 g 2   aspirin EC 81 MG tablet Take 81 mg by mouth daily.     Cyanocobalamin (B-12) 1000 MCG CAPS Take 1 capsule by mouth daily.     doxycycline (VIBRA-TABS) 100 MG tablet Take 1 tablet (100 mg total) by mouth 2 (two) times daily. 14 tablet 0   Flaxseed, Linseed, (FLAXSEED OIL) 1200 MG CAPS Take 1 capsule by mouth daily.     Fluticasone-Umeclidin-Vilant (TRELEGY ELLIPTA) 100-62.5-25 MCG/ACT AEPB Inhale 1 puff into the lungs daily. 60 each 0   furosemide (LASIX) 20 MG tablet Take 1 tablet (20 mg total) by mouth daily as needed for edema. 30 tablet 0   Ginkgo Biloba EXTR Take 1 tablet by mouth 2 (two) times daily.     guaiFENesin (MUCINEX) 600 MG 12 hr tablet Take 2 tablets (1,200 mg total) by mouth 2 (two) times daily. 30 tablet 2   ketoconazole (NIZORAL) 2 % shampoo APPLY   TOPICALLY TO AFFECTED AREA TWICE A WEEK 120 mL 0   loratadine (CLARITIN) 10 MG tablet TAKE 1 TABLET EVERY DAY 90 tablet 1   meloxicam (MOBIC) 15  MG tablet Take 1 tab everyday as needed for joint pain. 90 tablet 0   metFORMIN (GLUCOPHAGE) 500 MG tablet Take 0.5 tablets (250 mg total) by mouth daily. 90 tablet 3   Misc Natural Products (HIMALAYAN GOJI PO) Take 1 tablet by mouth in the morning and at bedtime.      Multiple Vitamin (MULTIVITAMIN WITH MINERALS) TABS tablet Take 1 tablet by mouth daily.     omeprazole (PRILOSEC) 20 MG capsule Take 1 capsule (20 mg total) by mouth 2 (two) times daily. 180 capsule 3   OXYGEN Inhale 2 L into the lungs continuous.     pravastatin (PRAVACHOL) 40 MG tablet Take 0.5 tablets (20 mg total) by mouth daily. 45 tablet 3   primidone (MYSOLINE) 50 MG tablet Take 1 tablet (50 mg total) by mouth 2 (two) times daily. 180 tablet 3   sildenafil (VIAGRA) 50 MG tablet TAKE 1 TABLET BY MOUTH DAILY AS NEEDED FOR ERECTILE DYSFUNCTION. 10 tablet 3   terazosin (HYTRIN) 1 MG capsule Take 1 capsule (1 mg total) by mouth 2 (two) times daily. 180 capsule 3   Tetrahydrozoline HCl (VISINE OP) Place 1 drop into both eyes 2 (two) times daily.     traMADol (ULTRAM) 50 MG tablet Take 1 tablet (50 mg total) by mouth every 12 (twelve) hours as needed. 30 tablet 0   triamcinolone cream (KENALOG)  0.1 % APPLY 1 APPLICATION TOPICALLY 2 (TWO) TIMES DAILY. 80 g 0   amLODipine (NORVASC) 5 MG tablet Take 1 tablet (5 mg total) by mouth daily. 90 tablet 3   chlorpheniramine (CHLOR-TRIMETON) 4 MG tablet Take 1 tablet (4 mg total) by mouth at bedtime as needed (cough). (Patient not taking: Reported on 08/21/2021) 14 tablet 0   predniSONE (DELTASONE) 20 MG tablet Take '40mg'$  for 5 days 10 tablet 0   No current facility-administered medications for this visit.    Allergies: Patient has no known allergies.  Past Medical History:  Diagnosis Date   Anal fissure    COPD (chronic obstructive pulmonary disease) ([place])    Diverticulosis    DM type 2 (diabetes mellitus, type 2) (HCC)    GERD (gastroesophageal reflux disease)    uses prilosec     Hypertension    Pneumonia 2018   Raynaud's disease    Shortness of breath dyspnea    WITH EXERTION   Sinusitis    Tremors of nervous system     Past Surgical History:  Procedure Laterality Date   COLONOSCOPY  03/27/2005   ESOPHAGOGASTRODUODENOSCOPY (EGD) WITH PROPOFOL N/A 02/08/2021   Procedure: ESOPHAGOGASTRODUODENOSCOPY (EGD) WITH PROPOFOL;  Surgeon: Milus Banister, MD;  Location: [facility];  Service: Endoscopy;  Laterality: N/A;   HERNIA REPAIR     INGUINAL HERNIA REPAIR N/A 05/06/2019   Procedure: LAPAROSCOPIC RIGHT AND LEFT INGUINAL HERNIA(S) REPAIR;  Surgeon: Michael Boston, MD;  Location: [facility];  Service: General;  Laterality: N/A;    Family History  Problem Relation Age of Onset   Dementia Brother    Diabetes Brother    Heart attack Father    Dementia Father     Social History   Tobacco Use   Smoking status: Former    Packs/day: 1.00    Years: 45.00    Total pack years: 45.00    Types: Cigarettes    Quit date: 01/21/1994    Years since quitting: 27.6   Smokeless tobacco: Never  Substance Use Topics   Alcohol use: Yes    Comment: a drink daily with dinner    Subjective:   Known history of COPD/ ILD; notes he started feeling "bad" over the weekend; complaining of deep productive cough; put himself back on his oxygen to help with his breathing; no OTC medications- using inhalers as prescribed;      Objective:  Vitals:   08/21/21 1048  BP: (!) 94/50  Pulse: 87  Resp: 20  SpO2: 93%  Weight: 145 lb (65.8 kg)  Height: '5\' 9"'$  (1.753 m)    General: Well developed, well nourished, in no acute distress  Skin : Warm and dry.  Head: Normocephalic and atraumatic  Eyes: Sclera and conjunctiva clear; pupils round and reactive to light; extraocular movements intact  Ears: External normal; canals clear; tympanic membranes normal  Oropharynx: Pink, supple. No suspicious lesions  Neck: Supple without thyromegaly, adenopathy  Lungs: Respirations  unlabored; coarse breath sounds; CVS exam: normal rate and regular rhythm.  Neurologic: Alert and oriented; speech intact; face symmetrical; in wheelchair;  Assessment:  1. Cough, unspecified type   2. COPD with chronic bronchitis and emphysema ([place])   3. COPD with acute exacerbation ([place])     Plan:  Will update CXR today; patient has already put himself back on his oxygen; start Doxycycline and Prednisone as directed; increase fluids, rest; follow up to be determined based on CXR results; of note, he is due  for repeat chest CT in September 2023 with his pulmonologist.   No follow-ups on file.  Orders Placed This Encounter  Procedures   DG Chest 2 View    Standing Status:   Future    Number of Occurrences:   1    Standing Expiration Date:   08/22/2022    Order Specific Question:   Reason for Exam (SYMPTOM  OR DIAGNOSIS REQUIRED)    Answer:   cough    Order Specific Question:   Preferred imaging location?    Answer:   [facility]    Requested Prescriptions   Signed Prescriptions Disp Refills   predniSONE (DELTASONE) 20 MG tablet 10 tablet 0    Sig: Take '40mg'$  for 5 days   doxycycline (VIBRA-TABS) 100 MG tablet 14 tablet 0    Sig: Take 1 tablet (100 mg total) by mouth 2 (two) times daily.

## 2021-08-21 NOTE — Progress Notes (Signed)
Called pt LVM to call our office to make appt with Dr.Copland in 10 days For follow up

## 2021-08-21 NOTE — Telephone Encounter (Signed)
Done

## 2021-08-22 ENCOUNTER — Telehealth: Payer: Self-pay | Admitting: Family Medicine

## 2021-08-22 NOTE — Telephone Encounter (Signed)
Patient states he needs a refill on his Brezeri medication. I was unable to find medication on medlist but he states he has been taking it and is almost out. Please advise.

## 2021-08-22 NOTE — Telephone Encounter (Signed)
According to Pulm:  " Called and spoke with patient. Patient stated that the breztri did not help him so he switched back to the trelegy 100. Patient stated that he was almost out of trelegy. Patient verified pharmacy.    Refill was sent in. Nothing further needed."

## 2021-08-22 NOTE — Telephone Encounter (Signed)
Hi Praveen- sent this refill request to you to avoid confusion.  Thank you!

## 2021-08-24 MED ORDER — TRELEGY ELLIPTA 100-62.5-25 MCG/ACT IN AEPB
1.0000 | INHALATION_SPRAY | Freq: Every day | RESPIRATORY_TRACT | 0 refills | Status: DC
Start: 2021-08-24 — End: 2021-08-31

## 2021-08-24 NOTE — Addendum Note (Signed)
Addended byMarshell Garfinkel on: 08/24/2021 11:08 AM   Modules accepted: Orders

## 2021-08-24 NOTE — Telephone Encounter (Addendum)
Refill order placed for trelegy 100 and patient was informed

## 2021-08-31 ENCOUNTER — Other Ambulatory Visit: Payer: Self-pay | Admitting: Pulmonary Disease

## 2021-08-31 MED ORDER — TRELEGY ELLIPTA 100-62.5-25 MCG/ACT IN AEPB: 1.0000 | INHALATION_SPRAY | Freq: Every day | RESPIRATORY_TRACT | 3 refills | Status: DC

## 2021-09-02 NOTE — Progress Notes (Signed)
[facility] [address] [place], [place] [address] [phone] [id number]  Date:  09/03/2021   Name:  Johnathan [name]   DOB:  [birth year]   MRN:  [id number]  PCP:  Darreld Mclean, MD    Chief Complaint: Medical Management of Chronic Issues (Pneumonia follow up //Balance and get more energy )   History of Present Illness:  Johnathan [name] is a 88 y.o. very pleasant male patient who presents with the following:  Last visit with myself in May- History of emphysema, diabetes, COPD, hypertension, former smoker. Johnathan [name] had been doing excellently for age until he got COVID in February 2022; he subsequently developed pulmonary fibrosis and is still using oxygen.  Patient seen today for follow-up-he is seen by my partner Jodi Mourning on 8/1 for likely COPD exacerbation.  He was using his oxygen, Ms. Valere Dross started doxycycline and prednisone Chest x-ray showed a right lower lobe pneumonia IMPRESSION: 1. Right lower lobe pneumonia. 2. COPD.   He is due to have a follow-up chest CT in [facility] pulmonologist ordered the following chest CT 6/21.  Follow-up chest CT is ordered already although not scheduled IMPRESSION: 1. New irregular 1.8 cm nodular focus of consolidation in the posteromedial right upper lobe. New patchy tree-in-bud opacity in the dependent peripheral right upper lobe. Follow-up noncontrast chest CT recommended in 3 months. 2. New solid 0.5 cm peripheral left upper lobe pulmonary nodule, which also can be reassessed on the above three-month follow-up chest CT. 3. Mild patchy subpleural ground-glass opacity and reticulation in both lungs, with a dependent lower lobe predominance. Scattered mild cylindrical bronchiectasis. No frank honeycombing. These findings are not substantially changed. Favor bland nonspecific postinfectious/postinflammatory scarring. An underlying nonprogressive mild interstitial lung disease such is  NSIP or UIP is not excluded, however is less favored. Findings are indeterminate for UIP per consensus guidelines: Diagnosis of Idiopathic Pulmonary Fibrosis: An Official ATS/ERS/JRS/ALAT Clinical Practice Guideline. [place], Iss 5, [phone], Sep 21 2016. 4. Three-vessel coronary atherosclerosis. 5. Cholelithiasis. 6. Aortic Atherosclerosis (ICD10-I70.0) and Emphysema (ICD10-J43.9).   He finished off the abx already He thinks he is improving He did try going back to work but then he got sick so he is out again - he thinks he will go ahead and retire now  He is using some vitamins - he wonders if ok He does use his oxygen at home as needed- generally just 1 L  Wt Readings from Last 3 Encounters:  09/03/21 150 lb 6.4 oz (68.2 kg)  08/21/21 145 lb (65.8 kg)  06/14/21 145 lb (65.8 kg)    Lab Results  Component Value Date   HGBA1C 6.3 06/07/2021    Patient Active Problem List   Diagnosis Date Noted   Interstitial pulmonary disease ([place]) 03/19/2021   Hypertension 03/19/2021   Oral candidiasis 03/05/2021   Chronic respiratory failure with hypoxia ([place]) 03/05/2021   Trigger finger, right ring finger 02/13/2021   COPD with chronic bronchitis and emphysema ([place]) 04/26/2020   COVID-19 02/25/2020   Shingles 11/14/2019   Raynaud's syndrome 11/14/2019   Skin cancer 11/14/2019   Left inguinal hernia s/p lap repair w mesh 05/06/2019 05/06/2019   Recurrent right inguinal hernia s/p lap repair w mesh 05/06/2019 03/01/2019   Diabetes mellitus ([place]) 03/01/2019   Pneumonia 04/21/2014   COPD with acute exacerbation ([place]) 04/21/2014    Past Medical History:  Diagnosis Date   Anal  fissure    COPD (chronic obstructive pulmonary disease) (HCC)    Diverticulosis    DM type 2 (diabetes mellitus, type 2) (HCC)    GERD (gastroesophageal reflux disease)    uses prilosec    Hypertension    Pneumonia 2018   Raynaud's disease    Shortness of breath dyspnea    WITH  EXERTION   Sinusitis    Tremors of nervous system     Past Surgical History:  Procedure Laterality Date   COLONOSCOPY  03/27/2005   ESOPHAGOGASTRODUODENOSCOPY (EGD) WITH PROPOFOL N/A 02/08/2021   Procedure: ESOPHAGOGASTRODUODENOSCOPY (EGD) WITH PROPOFOL;  Surgeon: Milus Banister, MD;  [facility];  Service: Endoscopy;  Laterality: N/A;   HERNIA REPAIR     INGUINAL HERNIA REPAIR N/A 05/06/2019   Procedure: LAPAROSCOPIC RIGHT AND LEFT INGUINAL HERNIA(S) REPAIR;  Surgeon: Michael Boston, MD;  Location: [facility];  Service: General;  Laterality: N/A;    Social History   Tobacco Use   Smoking status: Former    Packs/day: 1.00    Years: 45.00    Total pack years: 45.00    Types: Cigarettes    Quit date: 01/21/1994    Years since quitting: 27.6   Smokeless tobacco: Never  Substance Use Topics   Alcohol use: Yes    Comment: a drink daily with dinner   Drug use: No    Family History  Problem Relation Age of Onset   Dementia Brother    Diabetes Brother    Heart attack Father    Dementia Father     No Known Allergies  Medication list has been reviewed and updated.  Current Outpatient Medications on File Prior to Visit  Medication Sig Dispense Refill   albuterol (VENTOLIN HFA) 108 (90 Base) MCG/ACT inhaler Inhale 2 puffs into the lungs every 6 (six) hours as needed for wheezing or shortness of breath. 18 g 2   amLODipine (NORVASC) 5 MG tablet Take 1 tablet (5 mg total) by mouth daily. 90 tablet 3   aspirin EC 81 MG tablet Take 81 mg by mouth daily.     Flaxseed, Linseed, (FLAXSEED OIL) 1200 MG CAPS Take 1 capsule by mouth daily.     Fluticasone-Umeclidin-Vilant (TRELEGY ELLIPTA) 100-62.5-25 MCG/ACT AEPB Inhale 1 puff into the lungs daily. 180 each 3   Ginkgo Biloba EXTR Take 1 tablet by mouth 2 (two) times daily.     ketoconazole (NIZORAL) 2 % shampoo APPLY   TOPICALLY TO AFFECTED AREA TWICE A WEEK 120 mL 0   loratadine (CLARITIN) 10 MG tablet TAKE 1  TABLET EVERY DAY 90 tablet 1   meloxicam (MOBIC) 15 MG tablet Take 1 tab everyday as needed for joint pain. 90 tablet 0   metFORMIN (GLUCOPHAGE) 500 MG tablet Take 0.5 tablets (250 mg total) by mouth daily. 90 tablet 3   Misc Natural Products (HIMALAYAN GOJI PO) Take 1 tablet by mouth in the morning and at bedtime.      Multiple Vitamin (MULTIVITAMIN WITH MINERALS) TABS tablet Take 1 tablet by mouth daily.     omeprazole (PRILOSEC) 20 MG capsule Take 1 capsule (20 mg total) by mouth 2 (two) times daily. 180 capsule 3   OXYGEN Inhale 2 L into the lungs continuous.     pravastatin (PRAVACHOL) 40 MG tablet Take 0.5 tablets (20 mg total) by mouth daily. 45 tablet 3   primidone (MYSOLINE) 50 MG tablet Take 1 tablet (50 mg total) by mouth 2 (two) times daily. 180 tablet  3   sildenafil (VIAGRA) 50 MG tablet TAKE 1 TABLET BY MOUTH DAILY AS NEEDED FOR ERECTILE DYSFUNCTION. 10 tablet 3   terazosin (HYTRIN) 1 MG capsule Take 1 capsule (1 mg total) by mouth 2 (two) times daily. 180 capsule 3   Tetrahydrozoline HCl (VISINE OP) Place 1 drop into both eyes 2 (two) times daily.     traMADol (ULTRAM) 50 MG tablet Take 1 tablet (50 mg total) by mouth every 12 (twelve) hours as needed. 30 tablet 0   triamcinolone cream (KENALOG) 0.1 % APPLY 1 APPLICATION TOPICALLY 2 (TWO) TIMES DAILY. 80 g 0   chlorpheniramine (CHLOR-TRIMETON) 4 MG tablet Take 1 tablet (4 mg total) by mouth at bedtime as needed (cough). (Patient not taking: Reported on 08/21/2021) 14 tablet 0   Cyanocobalamin (B-12) 1000 MCG CAPS Take 1 capsule by mouth daily. (Patient not taking: Reported on 09/03/2021)     doxycycline (VIBRA-TABS) 100 MG tablet Take 1 tablet (100 mg total) by mouth 2 (two) times daily. (Patient not taking: Reported on 09/03/2021) 14 tablet 0   furosemide (LASIX) 20 MG tablet Take 1 tablet (20 mg total) by mouth daily as needed for edema. (Patient not taking: Reported on 09/03/2021) 30 tablet 0   guaiFENesin (MUCINEX) 600 MG 12 hr tablet  Take 2 tablets (1,200 mg total) by mouth 2 (two) times daily. (Patient not taking: Reported on 09/03/2021) 30 tablet 2   predniSONE (DELTASONE) 20 MG tablet Take '40mg'$  for 5 days (Patient not taking: Reported on 09/03/2021) 10 tablet 0   No current facility-administered medications on file prior to visit.    Review of Systems:  As per HPI- otherwise negative.   Physical Examination: Vitals:   09/03/21 1125  BP: 130/60  Pulse: 69  Temp: 98.1 F (36.7 C)  SpO2: 93%   Vitals:   09/03/21 1125  Weight: 150 lb 6.4 oz (68.2 kg)  Height: '5\' 9"'$  (1.753 m)   Body mass index is 22.21 kg/m. Ideal Body Weight: Weight in (lb) to have BMI = 25: 168.9  GEN: no acute distress.  Normal weight, looks well HEENT: Atraumatic, Normocephalic.  Ears and Nose: No external deformity. CV: RRR, No M/G/R. No JVD. No thrill. No extra heart sounds. PULM: CTA B, no wheezes, crackles, rhonchi. No retractions. No resp. distress. No accessory muscle use. ABD: S, NT, ND, +BS. No rebound. No HSM. EXTR: No c/c/e PSYCH: Normally interactive. Conversant.    Assessment and Plan: Community acquired pneumonia, unspecified laterality  Following up after treatment for community-acquired pneumonia.  Current notes he is feeling well, feels like he is back to normal.  He continues to use home oxygen as needed He has an appt to see me next month and wishes to keep this appt    Signed Lamar Blinks, MD

## 2021-09-03 ENCOUNTER — Ambulatory Visit (INDEPENDENT_AMBULATORY_CARE_PROVIDER_SITE_OTHER): Payer: Medicare Other | Admitting: Family Medicine

## 2021-09-03 ENCOUNTER — Encounter: Payer: Self-pay | Admitting: Family Medicine

## 2021-09-03 VITALS — BP 130/60 | HR 69 | Temp 98.1°F | Ht 69.0 in | Wt 150.4 lb

## 2021-09-03 DIAGNOSIS — J189 Pneumonia, unspecified organism: Secondary | ICD-10-CM

## 2021-09-03 NOTE — Patient Instructions (Signed)
It was good to see you again today- please let me know if you are not continuing to improve Let us know if you don't hear from pulmonology about your CT scan for next month Continue to use oxygen as needed   Please see me in about 4 months assuming all is well

## 2021-09-04 DIAGNOSIS — D2271 Melanocytic nevi of right lower limb, including hip: Secondary | ICD-10-CM | POA: Diagnosis not present

## 2021-09-04 DIAGNOSIS — D692 Other nonthrombocytopenic purpura: Secondary | ICD-10-CM | POA: Diagnosis not present

## 2021-09-04 DIAGNOSIS — D034 Melanoma in situ of scalp and neck: Secondary | ICD-10-CM | POA: Diagnosis not present

## 2021-09-04 DIAGNOSIS — L821 Other seborrheic keratosis: Secondary | ICD-10-CM | POA: Diagnosis not present

## 2021-09-04 DIAGNOSIS — D0339 Melanoma in situ of other parts of face: Secondary | ICD-10-CM | POA: Diagnosis not present

## 2021-09-04 DIAGNOSIS — L570 Actinic keratosis: Secondary | ICD-10-CM | POA: Diagnosis not present

## 2021-09-04 DIAGNOSIS — D485 Neoplasm of uncertain behavior of skin: Secondary | ICD-10-CM | POA: Diagnosis not present

## 2021-09-04 DIAGNOSIS — Z85828 Personal history of other malignant neoplasm of skin: Secondary | ICD-10-CM | POA: Diagnosis not present

## 2021-09-04 DIAGNOSIS — D2272 Melanocytic nevi of left lower limb, including hip: Secondary | ICD-10-CM | POA: Diagnosis not present

## 2021-09-10 ENCOUNTER — Other Ambulatory Visit: Payer: Self-pay

## 2021-09-10 ENCOUNTER — Encounter (HOSPITAL_COMMUNITY): Payer: Self-pay

## 2021-09-10 ENCOUNTER — Emergency Department (HOSPITAL_COMMUNITY): Payer: Medicare Other

## 2021-09-10 ENCOUNTER — Inpatient Hospital Stay (HOSPITAL_COMMUNITY)
Admission: EM | Admit: 2021-09-10 | Discharge: 2021-09-14 | DRG: 190 | Disposition: A | Discharge status: Home / Self Care | Payer: Medicare Other | Attending: Internal Medicine | Admitting: Internal Medicine

## 2021-09-10 ENCOUNTER — Telehealth: Payer: Self-pay

## 2021-09-10 ENCOUNTER — Inpatient Hospital Stay (HOSPITAL_COMMUNITY): Payer: Medicare Other

## 2021-09-10 DIAGNOSIS — J440 Chronic obstructive pulmonary disease with acute lower respiratory infection: Secondary | ICD-10-CM | POA: Diagnosis not present

## 2021-09-10 DIAGNOSIS — Z9981 Dependence on supplemental oxygen: Secondary | ICD-10-CM

## 2021-09-10 DIAGNOSIS — J189 Pneumonia, unspecified organism: Secondary | ICD-10-CM | POA: Diagnosis present

## 2021-09-10 DIAGNOSIS — Z66 Do not resuscitate: Secondary | ICD-10-CM | POA: Diagnosis present

## 2021-09-10 DIAGNOSIS — I152 Hypertension secondary to endocrine disorders: Secondary | ICD-10-CM | POA: Diagnosis present

## 2021-09-10 DIAGNOSIS — Z7982 Long term (current) use of aspirin: Secondary | ICD-10-CM | POA: Diagnosis not present

## 2021-09-10 DIAGNOSIS — D638 Anemia in other chronic diseases classified elsewhere: Secondary | ICD-10-CM | POA: Diagnosis present

## 2021-09-10 DIAGNOSIS — Z7952 Long term (current) use of systemic steroids: Secondary | ICD-10-CM | POA: Diagnosis not present

## 2021-09-10 DIAGNOSIS — K219 Gastro-esophageal reflux disease without esophagitis: Secondary | ICD-10-CM | POA: Diagnosis not present

## 2021-09-10 DIAGNOSIS — I7300 Raynaud's syndrome without gangrene: Secondary | ICD-10-CM | POA: Diagnosis present

## 2021-09-10 DIAGNOSIS — E1165 Type 2 diabetes mellitus with hyperglycemia: Secondary | ICD-10-CM | POA: Diagnosis present

## 2021-09-10 DIAGNOSIS — Z20822 Contact with and (suspected) exposure to covid-19: Secondary | ICD-10-CM | POA: Diagnosis present

## 2021-09-10 DIAGNOSIS — J45901 Unspecified asthma with (acute) exacerbation: Secondary | ICD-10-CM | POA: Diagnosis not present

## 2021-09-10 DIAGNOSIS — I10 Essential (primary) hypertension: Secondary | ICD-10-CM | POA: Diagnosis not present

## 2021-09-10 DIAGNOSIS — Z833 Family history of diabetes mellitus: Secondary | ICD-10-CM

## 2021-09-10 DIAGNOSIS — R5383 Other fatigue: Secondary | ICD-10-CM | POA: Diagnosis not present

## 2021-09-10 DIAGNOSIS — J9621 Acute and chronic respiratory failure with hypoxia: Secondary | ICD-10-CM

## 2021-09-10 DIAGNOSIS — J439 Emphysema, unspecified: Secondary | ICD-10-CM | POA: Diagnosis not present

## 2021-09-10 DIAGNOSIS — E1159 Type 2 diabetes mellitus with other circulatory complications: Secondary | ICD-10-CM | POA: Diagnosis present

## 2021-09-10 DIAGNOSIS — Z7951 Long term (current) use of inhaled steroids: Secondary | ICD-10-CM

## 2021-09-10 DIAGNOSIS — Z8701 Personal history of pneumonia (recurrent): Secondary | ICD-10-CM

## 2021-09-10 DIAGNOSIS — J441 Chronic obstructive pulmonary disease with (acute) exacerbation: Principal | ICD-10-CM | POA: Diagnosis present

## 2021-09-10 DIAGNOSIS — Z79899 Other long term (current) drug therapy: Secondary | ICD-10-CM

## 2021-09-10 DIAGNOSIS — Z7984 Long term (current) use of oral hypoglycemic drugs: Secondary | ICD-10-CM

## 2021-09-10 DIAGNOSIS — N400 Enlarged prostate without lower urinary tract symptoms: Secondary | ICD-10-CM | POA: Diagnosis present

## 2021-09-10 DIAGNOSIS — Z87891 Personal history of nicotine dependence: Secondary | ICD-10-CM

## 2021-09-10 DIAGNOSIS — E119 Type 2 diabetes mellitus without complications: Secondary | ICD-10-CM

## 2021-09-10 DIAGNOSIS — R059 Cough, unspecified: Secondary | ICD-10-CM | POA: Diagnosis not present

## 2021-09-10 DIAGNOSIS — R051 Acute cough: Secondary | ICD-10-CM

## 2021-09-10 DIAGNOSIS — Z8249 Family history of ischemic heart disease and other diseases of the circulatory system: Secondary | ICD-10-CM

## 2021-09-10 DIAGNOSIS — E872 Acidosis, unspecified: Secondary | ICD-10-CM | POA: Diagnosis not present

## 2021-09-10 DIAGNOSIS — Z79891 Long term (current) use of opiate analgesic: Secondary | ICD-10-CM

## 2021-09-10 DIAGNOSIS — R0602 Shortness of breath: Secondary | ICD-10-CM | POA: Diagnosis not present

## 2021-09-10 DIAGNOSIS — J9601 Acute respiratory failure with hypoxia: Secondary | ICD-10-CM | POA: Diagnosis present

## 2021-09-10 LAB — CBC WITH DIFFERENTIAL/PLATELET
Abs Immature Granulocytes: 0.03 10*3/uL (ref 0.00–0.07)
Basophils Absolute: 0 10*3/uL (ref 0.0–0.1)
Basophils Relative: 0 %
Eosinophils Absolute: 0.1 10*3/uL (ref 0.0–0.5)
Eosinophils Relative: 2 %
HCT: 38.3 % — ABNORMAL LOW (ref 39.0–52.0)
Hemoglobin: 12.7 g/dL — ABNORMAL LOW (ref 13.0–17.0)
Immature Granulocytes: 0 %
Lymphocytes Relative: 14 %
Lymphs Abs: 1 10*3/uL (ref 0.7–4.0)
MCH: 32.3 pg (ref 26.0–34.0)
MCHC: 33.2 g/dL (ref 30.0–36.0)
MCV: 97.5 fL (ref 80.0–100.0)
Monocytes Absolute: 0.7 10*3/uL (ref 0.1–1.0)
Monocytes Relative: 10 %
Neutro Abs: 5.2 10*3/uL (ref 1.7–7.7)
Neutrophils Relative %: 74 %
Platelets: 193 10*3/uL (ref 150–400)
RBC: 3.93 MIL/uL — ABNORMAL LOW (ref 4.22–5.81)
RDW: 13.2 % (ref 11.5–15.5)
WBC: 7.2 10*3/uL (ref 4.0–10.5)
nRBC: 0 % (ref 0.0–0.2)

## 2021-09-10 LAB — LACTIC ACID, PLASMA: Lactic Acid, Venous: 1.8 mmol/L (ref 0.5–1.9)

## 2021-09-10 LAB — COMPREHENSIVE METABOLIC PANEL
ALT: 17 U/L (ref 0–44)
AST: 17 U/L (ref 15–41)
Albumin: 3.6 g/dL (ref 3.5–5.0)
Alkaline Phosphatase: 67 U/L (ref 38–126)
Anion gap: 11 (ref 5–15)
BUN: 18 mg/dL (ref 8–23)
CO2: 24 mmol/L (ref 22–32)
Calcium: 9.2 mg/dL (ref 8.9–10.3)
Chloride: 101 mmol/L (ref 98–111)
Creatinine, Ser: 1.08 mg/dL (ref 0.61–1.24)
GFR, Estimated: >60 mL/min (ref >60)
Glucose, Bld: 203 mg/dL — ABNORMAL HIGH (ref 70–99)
Potassium: 4.4 mmol/L (ref 3.5–5.1)
Sodium: 136 mmol/L (ref 135–145)
Total Bilirubin: 0.6 mg/dL (ref 0.3–1.2)
Total Protein: 7.8 g/dL (ref 6.5–8.1)

## 2021-09-10 LAB — SARS CORONAVIRUS 2 BY RT PCR: SARS Coronavirus 2 by RT PCR: NEGATIVE

## 2021-09-10 LAB — PROTIME-INR
INR: 1.1 (ref 0.8–1.2)
Prothrombin Time: 14 seconds (ref 11.4–15.2)

## 2021-09-10 LAB — APTT: aPTT: 33 seconds (ref 24–36)

## 2021-09-10 MED ORDER — SODIUM CHLORIDE 0.9 % IV BOLUS
1000.0000 mL | Freq: Once | INTRAVENOUS | Status: AC
  Administered 2021-09-10: 1000 mL via INTRAVENOUS

## 2021-09-10 MED ORDER — IOHEXOL 350 MG/ML SOLN
80.0000 mL | Freq: Once | INTRAVENOUS | Status: AC | PRN
  Administered 2021-09-11: 80 mL via INTRAVENOUS

## 2021-09-10 MED ORDER — SODIUM CHLORIDE 0.9 % IV SOLN
500.0000 mg | Freq: Once | INTRAVENOUS | Status: AC
  Administered 2021-09-10: 500 mg via INTRAVENOUS
  Filled 2021-09-10: qty 5

## 2021-09-10 MED ORDER — CEFTRIAXONE SODIUM 1 G IJ SOLR
1.0000 g | Freq: Once | INTRAMUSCULAR | Status: AC
  Administered 2021-09-10: 1 g via INTRAVENOUS
  Filled 2021-09-10: qty 10

## 2021-09-10 MED ORDER — AZITHROMYCIN 250 MG PO TABS: 500.0000 mg | ORAL_TABLET | Freq: Once | ORAL | Status: DC

## 2021-09-10 MED ORDER — METHYLPREDNISOLONE SODIUM SUCC 125 MG IJ SOLR
125.0000 mg | Freq: Once | INTRAMUSCULAR | Status: AC
Start: 2021-09-10 — End: 2021-09-10
  Administered 2021-09-10: 125 mg via INTRAVENOUS
  Filled 2021-09-10: qty 2

## 2021-09-10 MED ORDER — ACETAMINOPHEN 500 MG PO TABS
1000.0000 mg | ORAL_TABLET | Freq: Once | ORAL | Status: AC
  Administered 2021-09-10: 1000 mg via ORAL
  Filled 2021-09-10: qty 2

## 2021-09-10 MED ORDER — ALBUTEROL SULFATE (2.5 MG/3ML) 0.083% IN NEBU
2.5000 mg | INHALATION_SOLUTION | Freq: Once | RESPIRATORY_TRACT | Status: AC
  Administered 2021-09-10: 2.5 mg via RESPIRATORY_TRACT
  Filled 2021-09-10: qty 3

## 2021-09-10 MED ORDER — SODIUM CHLORIDE 0.9 % IV SOLN: 1.0000 g | Freq: Once | INTRAVENOUS | Status: DC

## 2021-09-10 MED ORDER — IPRATROPIUM-ALBUTEROL 0.5-2.5 (3) MG/3ML IN SOLN
3.0000 mL | Freq: Once | RESPIRATORY_TRACT | Status: AC
  Administered 2021-09-10: 3 mL via RESPIRATORY_TRACT
  Filled 2021-09-10: qty 3

## 2021-09-10 NOTE — Telephone Encounter (Signed)
Pt is calling today and he reports that he is having a deep cough. Took otc Melysn cough medication and that has not helped at all.  This cough has been going on for about a week and it is getting worse. Pt was last seen at 09/03/21.  Please advise on any recommendations in the mean time.

## 2021-09-10 NOTE — ED Provider Triage Note (Cosign Needed Addendum)
Emergency Medicine Provider Triage Evaluation Note  Johnathan [name] , a 88 y.o. male  was evaluated in triage.  Pt complains of progressive worsening SOB. Coughing much more than usual. 4 days of coughing and worsening breathing.  Typically on low amount of oxygen but requiring more now because of hypoxia  Review of Systems  Positive: SOB, cough Negative: Fever   Physical Exam  BP 138/78 (BP Location: Right Arm)   Pulse (!) 107   Temp 99.9 F (37.7 C) (Oral)   Resp (!) 24   Ht '5\' 9"'$  (1.753 m)   Wt 68.2 kg   SpO2 (!) 86% Comment: pt currently on 2L  BMI 22.21 kg/m  Gen:   Awake, no distress   Resp:  Normal effort  MSK:   Moves extremities without difficulty  Other:  Crackles in R base, hypoxia, mild tachypnea.   Medical Decision Making  Medically screening exam initiated at 9:30 PM.  Appropriate orders placed.  [name] was informed that the remainder of the evaluation will be completed by another provider, this initial triage assessment does not replace that evaluation, and the importance of remaining in the ED until their evaluation is complete.  Meets sirs w likely RLL PNA as source.   Results for orders placed or performed during the hospital encounter of 09/10/21  SARS Coronavirus 2 by RT PCR (hospital order, performed in [facility] hospital lab) *cepheid single result test* Anterior Nasal Swab   Specimen: Anterior Nasal Swab  Result Value Ref Range   SARS Coronavirus 2 by RT PCR NEGATIVE NEGATIVE   CXR pending     Tedd Sias, PA 09/10/21 2134    Tedd Sias, [place] 09/10/21 2135

## 2021-09-10 NOTE — ED Triage Notes (Signed)
Pt c/o SHOB, cough that is worse than usual- pt has COPD, is oxygen dependent 2 lpm.

## 2021-09-10 NOTE — ED Provider Notes (Signed)
[facility] DEPT Provider Note   CSN: [id number] Arrival date & time: 09/10/21  2020     History  Chief Complaint  Patient presents with   Shortness of Breath   Cough    Johnathan [name] is a 88 y.o. male.  Patient here with shortness of breath, cough worsening over the last 4 days.  Typically on 2 L of oxygen now requiring increased oxygen now in the 4 to 5 L range.  Symptoms worse with exertion.  Denies any chest pain.  Just finished a course of antibiotics for pneumonia.  Has not noticed any fevers at home.  Nothing makes it worse or better.  Breathing treatments have not helped much.  Denies any abdominal pain, nausea, vomiting, diarrhea.  History of COPD, hypertension.  The history is provided by the patient.       Home Medications Prior to Admission medications   Medication Sig Start Date End Date Taking? Authorizing Provider  albuterol (VENTOLIN HFA) 108 (90 Base) MCG/ACT inhaler Inhale 2 puffs into the lungs every 6 (six) hours as needed for wheezing or shortness of breath. 04/13/21   Copland, Gay Filler, MD  amLODipine (NORVASC) 5 MG tablet Take 1 tablet (5 mg total) by mouth daily. 01/30/21 09/03/21  Copland, Gay Filler, MD  aspirin EC 81 MG tablet Take 81 mg by mouth daily.    [provider]  chlorpheniramine (CHLOR-TRIMETON) 4 MG tablet Take 1 tablet (4 mg total) by mouth at bedtime as needed (cough). Patient not taking: Reported on 08/21/2021 03/05/21   Clayton Bibles, NP  Cyanocobalamin (B-12) 1000 MCG CAPS Take 1 capsule by mouth daily. Patient not taking: Reported on 09/03/2021    [provider]  doxycycline (VIBRA-TABS) 100 MG tablet Take 1 tablet (100 mg total) by mouth 2 (two) times daily. Patient not taking: Reported on 09/03/2021 08/21/21   Marrian Salvage, FNP  Flaxseed, Linseed, (FLAXSEED OIL) 1200 MG CAPS Take 1 capsule by mouth daily.    [provider]  Fluticasone-Umeclidin-Vilant (TRELEGY ELLIPTA)  100-62.5-25 MCG/ACT AEPB Inhale 1 puff into the lungs daily. 08/31/21   Mannam, Hart Robinsons, MD  furosemide (LASIX) 20 MG tablet Take 1 tablet (20 mg total) by mouth daily as needed for edema. Patient not taking: Reported on 09/03/2021 05/30/20   Marrian Salvage, FNP  Ginkgo Biloba EXTR Take 1 tablet by mouth 2 (two) times daily.    [provider]  guaiFENesin (MUCINEX) 600 MG 12 hr tablet Take 2 tablets (1,200 mg total) by mouth 2 (two) times daily. Patient not taking: Reported on 09/03/2021 02/27/21   Caleen Jobs B, NP  ketoconazole (NIZORAL) 2 % shampoo APPLY   TOPICALLY TO AFFECTED AREA TWICE A WEEK 08/13/21   Copland, Gay Filler, MD  loratadine (CLARITIN) 10 MG tablet TAKE 1 TABLET EVERY DAY 09/21/20   Copland, Gay Filler, MD  meloxicam (MOBIC) 15 MG tablet Take 1 tab everyday as needed for joint pain. 07/16/21   Copland, Gay Filler, MD  metFORMIN (GLUCOPHAGE) 500 MG tablet Take 0.5 tablets (250 mg total) by mouth daily. 01/30/21   Copland, Gay Filler, MD  Misc Natural Products (HIMALAYAN GOJI PO) Take 1 tablet by mouth in the morning and at bedtime.     [provider]  Multiple Vitamin (MULTIVITAMIN WITH MINERALS) TABS tablet Take 1 tablet by mouth daily.    [provider]  omeprazole (PRILOSEC) 20 MG capsule Take 1 capsule (20 mg total) by mouth 2 (two) times  daily. 01/30/21   Copland, Gay Filler, MD  OXYGEN Inhale 2 L into the lungs continuous.    [provider]  pravastatin (PRAVACHOL) 40 MG tablet Take 0.5 tablets (20 mg total) by mouth daily. 01/30/21   Copland, Gay Filler, MD  predniSONE (DELTASONE) 20 MG tablet Take '40mg'$  for 5 days Patient not taking: Reported on 09/03/2021 08/21/21   Marrian Salvage, FNP  primidone (MYSOLINE) 50 MG tablet Take 1 tablet (50 mg total) by mouth 2 (two) times daily. 01/30/21   Copland, Gay Filler, MD  sildenafil (VIAGRA) 50 MG tablet TAKE 1 TABLET BY MOUTH DAILY AS NEEDED FOR ERECTILE DYSFUNCTION. 01/01/21   Copland, Gay Filler,  MD  terazosin (HYTRIN) 1 MG capsule Take 1 capsule (1 mg total) by mouth 2 (two) times daily. 01/30/21   Copland, Gay Filler, MD  Tetrahydrozoline HCl (VISINE OP) Place 1 drop into both eyes 2 (two) times daily.    [provider]  traMADol (ULTRAM) 50 MG tablet Take 1 tablet (50 mg total) by mouth every 12 (twelve) hours as needed. 06/27/21   Copland, Gay Filler, MD  triamcinolone cream (KENALOG) 0.1 % APPLY 1 APPLICATION TOPICALLY 2 (TWO) TIMES DAILY. 01/11/21   Copland, Gay Filler, MD      Allergies    Patient has no known allergies.    Review of Systems   Review of Systems  Physical Exam Updated Vital Signs  ED Triage Vitals  Enc Vitals Group     BP 09/10/21 2038 138/78     Pulse Rate 09/10/21 2038 (!) 107     Resp 09/10/21 2038 (!) 24     Temp 09/10/21 2038 99.9 F (37.7 C)     Temp Source 09/10/21 2038 Oral     SpO2 09/10/21 2038 (!) 86 %     Weight 09/10/21 2049 150 lb 6.4 oz (68.2 kg)     Height 09/10/21 2049 '5\' 9"'$  (1.753 m)     Head Circumference --      Peak Flow --      Pain Score 09/10/21 2049 0     Pain Loc --      Pain Edu? --      Excl. in [place]? --     Physical Exam Vitals and nursing note reviewed.  Constitutional:      General: He is not in acute distress.    Appearance: He is well-developed. He is not ill-appearing.  HENT:     Head: Normocephalic and atraumatic.     Mouth/Throat:     Mouth: Mucous membranes are moist.  Eyes:     Conjunctiva/sclera: Conjunctivae normal.     Pupils: Pupils are equal, round, and reactive to light.  Cardiovascular:     Rate and Rhythm: Normal rate and regular rhythm.     Pulses: Normal pulses.     Heart sounds: Normal heart sounds. No murmur heard. Pulmonary:     Effort: Tachypnea present. No respiratory distress.     Breath sounds: Decreased breath sounds and wheezing present.  Abdominal:     Palpations: Abdomen is soft.     Tenderness: There is no abdominal tenderness.  Musculoskeletal:        General: No  swelling.     Cervical back: Normal range of motion and neck supple.  Skin:    General: Skin is warm and dry.     Capillary Refill: Capillary refill takes less than 2 seconds.  Neurological:     General: No focal deficit  present.     Mental Status: He is alert.  Psychiatric:        Mood and Affect: Mood normal.     ED Results / Procedures / Treatments   Labs (all labs ordered are listed, but only abnormal results are displayed) Labs Reviewed  COMPREHENSIVE METABOLIC PANEL - Abnormal; Notable for the following components:      Result Value   Glucose, Bld 203 (*)    All other components within normal limits  CBC WITH DIFFERENTIAL/PLATELET - Abnormal; Notable for the following components:   RBC 3.93 (*)    Hemoglobin 12.7 (*)    HCT 38.3 (*)    All other components within normal limits  SARS CORONAVIRUS 2 BY RT PCR  CULTURE, BLOOD (ROUTINE X 2)  CULTURE, BLOOD (ROUTINE X 2)  URINE CULTURE  LACTIC ACID, PLASMA  PROTIME-INR  APTT  LACTIC ACID, PLASMA  URINALYSIS, ROUTINE W REFLEX MICROSCOPIC    EKG EKG Interpretation  Date/Time:  Monday September 10 2021 22:00:44 EDT Ventricular Rate:  88 PR Interval:  126 QRS Duration: 99 QT Interval:  353 QTC Calculation: 428 R Axis:   47 Text Interpretation: Sinus rhythm Confirmed by Lennice Sites ([id number]) on 09/10/2021 10:40:04 PM  Radiology DG Chest 2 View  Result Date: 09/10/2021 CLINICAL DATA:  Shortness of breath, fatigue, cough EXAM: CHEST - 2 VIEW COMPARISON:  08/21/2021.  CT 07/11/2021 FINDINGS: Increased markings and airspace opacity again noted in the right mid and lower lung, similar to prior study. Is likely reflects pneumonia superimposed on chronic lung disease. Heart is normal size. No effusions or acute bony abnormality. IMPRESSION: Continued right lower lobe airspace opacity concerning for pneumonia superimposed on chronic lung disease. Electronically Signed   By: Rolm Baptise M.D.   On: 09/10/2021 21:31     Procedures .Critical Care  Performed by: Lennice Sites, DO Authorized by: Lennice Sites, DO   Critical care provider statement:    Critical care time (minutes):  35   Critical care was necessary to treat or prevent imminent or life-threatening deterioration of the following conditions:  Sepsis and respiratory failure   Critical care was time spent personally by me on the following activities:  Blood draw for specimens, development of treatment plan with patient or surrogate, discussions with primary provider, evaluation of patient's response to treatment, examination of patient, obtaining history from patient or surrogate, ordering and performing treatments and interventions, ordering and review of laboratory studies, ordering and review of radiographic studies, pulse oximetry, re-evaluation of patient's condition and review of old charts   Care discussed with: admitting provider       Medications Ordered in ED Medications  ipratropium-albuterol (DUONEB) 0.5-2.5 (3) MG/3ML nebulizer solution 3 mL (has no administration in time range)  albuterol (PROVENTIL) (2.5 MG/3ML) 0.083% nebulizer solution 2.5 mg (has no administration in time range)  cefTRIAXone (ROCEPHIN) 1 g in sodium chloride 0.9 % 100 mL IVPB (has no administration in time range)  azithromycin (ZITHROMAX) 500 mg in sodium chloride 0.9 % 250 mL IVPB (has no administration in time range)  methylPREDNISolone sodium succinate (SOLU-MEDROL) 125 mg/2 mL injection 125 mg (has no administration in time range)  sodium chloride 0.9 % bolus 1,000 mL (has no administration in time range)  acetaminophen (TYLENOL) tablet 1,000 mg (1,000 mg Oral Given 09/10/21 2227)    ED Course/ Medical Decision Making/ A&P  Medical Decision Making Amount and/or Complexity of Data Reviewed Radiology: ordered.  Risk Prescription drug management. Decision regarding hospitalization.   JORDELL OUTTEN is here with cough and  shortness of breath.  History of COPD on 2 L of oxygen.  Hypoxic in triage and increased to 4 L.  Just finished a course of antibiotics for presumed pneumonia.  Had a CT scan that showed possible lung nodule/new irregular focus in the right upper lobe several months ago.  He had a nodule in the left upper lobe as well.  Overall he arrives with a low-grade fever, hypoxia, tachypnea.  Code sepsis initiated in triage.  Broad-spectrum IV antibiotics given.  Differential diagnosis is likely ongoing pneumonia versus COPD exacerbation versus possibly mass effect.  We will get blood cultures, CBC, lactic acid, CT scan of the chest, chest x-ray, will give breathing treatment and IV Solu-Medrol.  Will admit given new hypoxia and at minimum COPD exacerbation.  Overall suspect sepsis/pneumonia.  Chest x-ray consistent with pneumonia.  However no significant leukocytosis or lactic acidosis.  Per my review and interpretation of the chest x-ray suspect that there is pneumonia but will get a CT scan to further evaluate for mass.  Lab work per my review and interpretation is overall unremarkable.  Will admit given concern for infection, COPD exacerbation.  Patient with worsening hypoxia but work of breathing has improved following increase of oxygen.  This chart was dictated using voice recognition software.  Despite best efforts to proofread,  errors can occur which can change the documentation meaning.         Final Clinical Impression(s) / ED Diagnoses Final diagnoses:  COPD exacerbation ([place])  Acute on chronic respiratory failure with hypoxia ([place])  Community acquired pneumonia, unspecified laterality    Rx / DC Orders ED Discharge Orders     None         Lennice Sites, DO 09/10/21 2247

## 2021-09-10 NOTE — Sepsis Progress Note (Signed)
[facility] following for Sepsis Protocol 

## 2021-09-11 ENCOUNTER — Inpatient Hospital Stay (HOSPITAL_COMMUNITY): Payer: Medicare Other

## 2021-09-11 ENCOUNTER — Encounter (HOSPITAL_COMMUNITY): Payer: Self-pay | Admitting: Internal Medicine

## 2021-09-11 DIAGNOSIS — J9621 Acute and chronic respiratory failure with hypoxia: Secondary | ICD-10-CM | POA: Diagnosis not present

## 2021-09-11 DIAGNOSIS — E119 Type 2 diabetes mellitus without complications: Secondary | ICD-10-CM

## 2021-09-11 DIAGNOSIS — I10 Essential (primary) hypertension: Secondary | ICD-10-CM

## 2021-09-11 DIAGNOSIS — J189 Pneumonia, unspecified organism: Secondary | ICD-10-CM

## 2021-09-11 DIAGNOSIS — K219 Gastro-esophageal reflux disease without esophagitis: Secondary | ICD-10-CM

## 2021-09-11 LAB — URINALYSIS, ROUTINE W REFLEX MICROSCOPIC
Bilirubin Urine: NEGATIVE
Glucose, UA: NEGATIVE mg/dL
Hgb urine dipstick: NEGATIVE
Ketones, ur: NEGATIVE mg/dL
Leukocytes,Ua: NEGATIVE
Nitrite: NEGATIVE
Protein, ur: NEGATIVE mg/dL
Specific Gravity, Urine: 1.016 (ref 1.005–1.030)
pH: 5 (ref 5.0–8.0)

## 2021-09-11 LAB — GLUCOSE, CAPILLARY
Glucose-Capillary: 152 mg/dL — ABNORMAL HIGH (ref 70–99)
Glucose-Capillary: 145 mg/dL — ABNORMAL HIGH (ref 70–99)

## 2021-09-11 LAB — PROCALCITONIN: Procalcitonin: <0.1 ng/mL

## 2021-09-11 LAB — CBG MONITORING, ED
Glucose-Capillary: 220 mg/dL — ABNORMAL HIGH (ref 70–99)
Glucose-Capillary: 170 mg/dL — ABNORMAL HIGH (ref 70–99)

## 2021-09-11 LAB — LACTIC ACID, PLASMA
Lactic Acid, Venous: 2.9 mmol/L (ref 0.5–1.9)
Lactic Acid, Venous: 1.5 mmol/L (ref 0.5–1.9)

## 2021-09-11 MED ORDER — IPRATROPIUM-ALBUTEROL 0.5-2.5 (3) MG/3ML IN SOLN
3.0000 mL | Freq: Four times a day (QID) | RESPIRATORY_TRACT | Status: DC | PRN
Start: 2021-09-11 — End: 2021-09-11

## 2021-09-11 MED ORDER — SODIUM CHLORIDE 0.9 % IV SOLN
500.0000 mg | INTRAVENOUS | Status: DC
  Administered 2021-09-11 – 2021-09-13 (×3): 500 mg via INTRAVENOUS
  Filled 2021-09-11 (×4): qty 5

## 2021-09-11 MED ORDER — IPRATROPIUM-ALBUTEROL 0.5-2.5 (3) MG/3ML IN SOLN: 3.0000 mL | Freq: Four times a day (QID) | RESPIRATORY_TRACT | Status: DC | PRN

## 2021-09-11 MED ORDER — UMECLIDINIUM BROMIDE 62.5 MCG/ACT IN AEPB
1.0000 | INHALATION_SPRAY | Freq: Every day | RESPIRATORY_TRACT | Status: DC
  Administered 2021-09-11: 1 via RESPIRATORY_TRACT
  Filled 2021-09-11: qty 7

## 2021-09-11 MED ORDER — GUAIFENESIN ER 600 MG PO TB12
600.0000 mg | ORAL_TABLET | Freq: Two times a day (BID) | ORAL | Status: DC
Start: 2021-09-11 — End: 2021-09-14
  Administered 2021-09-11 – 2021-09-14 (×7): 600 mg via ORAL
  Filled 2021-09-11 (×7): qty 1

## 2021-09-11 MED ORDER — INSULIN ASPART 100 UNIT/ML IJ SOLN
0.0000 [IU] | Freq: Three times a day (TID) | INTRAMUSCULAR | Status: DC
  Administered 2021-09-11: 3 [IU] via SUBCUTANEOUS
  Administered 2021-09-11 (×2): 2 [IU] via SUBCUTANEOUS
  Administered 2021-09-12: 3 [IU] via SUBCUTANEOUS
  Administered 2021-09-12: 2 [IU] via SUBCUTANEOUS
  Administered 2021-09-13: 1 [IU] via SUBCUTANEOUS
  Administered 2021-09-13: 3 [IU] via SUBCUTANEOUS
  Administered 2021-09-13: 1 [IU] via SUBCUTANEOUS
  Administered 2021-09-14 (×2): 2 [IU] via SUBCUTANEOUS
  Filled 2021-09-11: qty 0.09

## 2021-09-11 MED ORDER — ENOXAPARIN SODIUM 40 MG/0.4ML IJ SOSY
40.0000 mg | PREFILLED_SYRINGE | INTRAMUSCULAR | Status: DC
  Administered 2021-09-11 – 2021-09-14 (×4): 40 mg via SUBCUTANEOUS
  Filled 2021-09-11 (×4): qty 0.4

## 2021-09-11 MED ORDER — PREDNISONE 20 MG PO TABS
40.0000 mg | ORAL_TABLET | Freq: Every day | ORAL | Status: DC
  Administered 2021-09-11 – 2021-09-12 (×2): 40 mg via ORAL
  Filled 2021-09-11 (×2): qty 2

## 2021-09-11 MED ORDER — FLUTICASONE FUROATE-VILANTEROL 100-25 MCG/ACT IN AEPB
1.0000 | INHALATION_SPRAY | Freq: Every day | RESPIRATORY_TRACT | Status: DC
  Administered 2021-09-11: 1 via RESPIRATORY_TRACT
  Filled 2021-09-11: qty 28

## 2021-09-11 MED ORDER — ASPIRIN 81 MG PO TBEC
81.0000 mg | DELAYED_RELEASE_TABLET | Freq: Every morning | ORAL | Status: DC
  Administered 2021-09-11 – 2021-09-14 (×4): 81 mg via ORAL
  Filled 2021-09-11 (×4): qty 1

## 2021-09-11 MED ORDER — ACETAMINOPHEN 325 MG PO TABS: 650.0000 mg | ORAL_TABLET | Freq: Four times a day (QID) | ORAL | Status: DC | PRN

## 2021-09-11 MED ORDER — PRIMIDONE 50 MG PO TABS
50.0000 mg | ORAL_TABLET | Freq: Two times a day (BID) | ORAL | Status: DC
  Administered 2021-09-11 – 2021-09-14 (×6): 50 mg via ORAL
  Filled 2021-09-11 (×7): qty 1

## 2021-09-11 MED ORDER — ACETAMINOPHEN 650 MG RE SUPP: 650.0000 mg | Freq: Four times a day (QID) | RECTAL | Status: DC | PRN

## 2021-09-11 MED ORDER — AMLODIPINE BESYLATE 5 MG PO TABS
5.0000 mg | ORAL_TABLET | Freq: Every morning | ORAL | Status: DC
  Administered 2021-09-11 – 2021-09-14 (×4): 5 mg via ORAL
  Filled 2021-09-11 (×4): qty 1

## 2021-09-11 MED ORDER — IPRATROPIUM-ALBUTEROL 0.5-2.5 (3) MG/3ML IN SOLN
3.0000 mL | Freq: Four times a day (QID) | RESPIRATORY_TRACT | Status: DC
  Administered 2021-09-11: 3 mL via RESPIRATORY_TRACT
  Filled 2021-09-11 (×2): qty 3

## 2021-09-11 MED ORDER — INSULIN ASPART 100 UNIT/ML IJ SOLN
0.0000 [IU] | Freq: Every day | INTRAMUSCULAR | Status: DC
  Filled 2021-09-11: qty 0.05

## 2021-09-11 MED ORDER — SODIUM CHLORIDE 0.9 % IV SOLN
1.0000 g | INTRAVENOUS | Status: DC
  Administered 2021-09-11 – 2021-09-13 (×3): 1 g via INTRAVENOUS
  Filled 2021-09-11 (×3): qty 10

## 2021-09-11 MED ORDER — TERAZOSIN HCL 1 MG PO CAPS
1.0000 mg | ORAL_CAPSULE | Freq: Two times a day (BID) | ORAL | Status: DC
  Administered 2021-09-11 – 2021-09-14 (×6): 1 mg via ORAL
  Filled 2021-09-11 (×7): qty 1

## 2021-09-11 NOTE — ED Notes (Signed)
Lunch tray given. 

## 2021-09-11 NOTE — H&P (Signed)
History and Physical    Johnathan [name] LOV:[id number] DOB: [birth year] DOA: 09/10/2021  PCP: Darreld Mclean, MD  Patient coming from: Home  Chief Complaint: Shortness of breath  HPI: Johnathan [name] is a 88 y.o. male with medical history significant of asthma COPD overlap syndrome, chronic hypoxic respiratory failure on 2 L O2, type 2 diabetes, GERD, hypertension, Raynaud's disease, BPH.  Diagnosed with pneumonia earlier this month and treated with doxycycline.  He presents to the ED complaining of shortness of breath and cough.  Slightly tachycardic and tachypneic on arrival.  Afebrile.  Hypoxic to the 80s on 2 L home oxygen, increased to 4-5 L.  Wheezing.  Labs showing no leukocytosis, SARS-CoV-2 PCR negative, INR 1.1, lactic acid normal, blood cultures drawn, glucose 203, hemoglobin 12.7. CTA chest negative for PE, showing reticular opacities in the mid lungs which have progressed since 07/11/2021. Patient was given Tylenol, albuterol neb, DuoNeb, Solu-Medrol 125 mg, ceftriaxone, azithromycin, and 1 L normal saline bolus.  Patient reports 1 week history of worsening shortness of breath and cough.  Denies fevers or chest pain.  States he was prescribed an antibiotic and prednisone taper earlier this month which he has finished.  States he is using Trelegy and albuterol at home but they are not working as well as they used to.  He uses 2 L O2 at home.  Review of Systems:  Review of Systems  All other systems reviewed and are negative.   Past Medical History:  Diagnosis Date   Anal fissure    COPD (chronic obstructive pulmonary disease) ([place])    Diverticulosis    DM type 2 (diabetes mellitus, type 2) ([facility])    GERD (gastroesophageal reflux disease)    uses prilosec    Hypertension    Pneumonia 2018   Raynaud's disease    Shortness of breath dyspnea    WITH EXERTION   Sinusitis    Tremors of nervous system     Past Surgical History:  Procedure Laterality Date   COLONOSCOPY   03/27/2005   ESOPHAGOGASTRODUODENOSCOPY (EGD) WITH PROPOFOL N/A 02/08/2021   Procedure: ESOPHAGOGASTRODUODENOSCOPY (EGD) WITH PROPOFOL;  Surgeon: Milus Banister, MD;  Location: [facility];  Service: Endoscopy;  Laterality: N/A;   HERNIA REPAIR     INGUINAL HERNIA REPAIR N/A 05/06/2019   Procedure: LAPAROSCOPIC RIGHT AND LEFT INGUINAL HERNIA(S) REPAIR;  Surgeon: Michael Boston, MD;  Location: [facility];  Service: General;  Laterality: N/A;     reports that he quit smoking about 27 years ago. His smoking use included cigarettes. He has a 45.00 pack-year smoking history. He has never used smokeless tobacco. He reports current alcohol use. He reports that he does not use drugs.  No Known Allergies  Family History  Problem Relation Age of Onset   Dementia Brother    Diabetes Brother    Heart attack Father    Dementia Father     Prior to Admission medications   Medication Sig Start Date End Date Taking? Authorizing Provider  albuterol (VENTOLIN HFA) 108 (90 Base) MCG/ACT inhaler Inhale 2 puffs into the lungs every 6 (six) hours as needed for wheezing or shortness of breath. 04/13/21   Copland, Gay Filler, MD  amLODipine (NORVASC) 5 MG tablet Take 1 tablet (5 mg total) by mouth daily. 01/30/21 09/03/21  Copland, Gay Filler, MD  aspirin EC 81 MG tablet Take 81 mg by mouth daily.    [provider]  chlorpheniramine (CHLOR-TRIMETON) 4 MG tablet Take 1  tablet (4 mg total) by mouth at bedtime as needed (cough). Patient not taking: Reported on 08/21/2021 03/05/21   Clayton Bibles, NP  Cyanocobalamin (B-12) 1000 MCG CAPS Take 1 capsule by mouth daily. Patient not taking: Reported on 09/03/2021    [provider]  doxycycline (VIBRA-TABS) 100 MG tablet Take 1 tablet (100 mg total) by mouth 2 (two) times daily. Patient not taking: Reported on 09/03/2021 08/21/21   Marrian Salvage, FNP  Flaxseed, Linseed, (FLAXSEED OIL) 1200 MG CAPS Take 1 capsule by mouth daily.     [provider]  Fluticasone-Umeclidin-Vilant (TRELEGY ELLIPTA) 100-62.5-25 MCG/ACT AEPB Inhale 1 puff into the lungs daily. 08/31/21   Mannam, Hart Robinsons, MD  furosemide (LASIX) 20 MG tablet Take 1 tablet (20 mg total) by mouth daily as needed for edema. Patient not taking: Reported on 09/03/2021 05/30/20   Marrian Salvage, FNP  Ginkgo Biloba EXTR Take 1 tablet by mouth 2 (two) times daily.    [provider]  guaiFENesin (MUCINEX) 600 MG 12 hr tablet Take 2 tablets (1,200 mg total) by mouth 2 (two) times daily. Patient not taking: Reported on 09/03/2021 02/27/21   Caleen Jobs B, NP  ketoconazole (NIZORAL) 2 % shampoo APPLY   TOPICALLY TO AFFECTED AREA TWICE A WEEK 08/13/21   Copland, Gay Filler, MD  loratadine (CLARITIN) 10 MG tablet TAKE 1 TABLET EVERY DAY 09/21/20   Copland, Gay Filler, MD  meloxicam (MOBIC) 15 MG tablet Take 1 tab everyday as needed for joint pain. 07/16/21   Copland, Gay Filler, MD  metFORMIN (GLUCOPHAGE) 500 MG tablet Take 0.5 tablets (250 mg total) by mouth daily. 01/30/21   Copland, Gay Filler, MD  Misc Natural Products (HIMALAYAN GOJI PO) Take 1 tablet by mouth in the morning and at bedtime.     [provider]  Multiple Vitamin (MULTIVITAMIN WITH MINERALS) TABS tablet Take 1 tablet by mouth daily.    [provider]  omeprazole (PRILOSEC) 20 MG capsule Take 1 capsule (20 mg total) by mouth 2 (two) times daily. 01/30/21   Copland, Gay Filler, MD  OXYGEN Inhale 2 L into the lungs continuous.    [provider]  pravastatin (PRAVACHOL) 40 MG tablet Take 0.5 tablets (20 mg total) by mouth daily. 01/30/21   Copland, Gay Filler, MD  predniSONE (DELTASONE) 20 MG tablet Take '40mg'$  for 5 days Patient not taking: Reported on 09/03/2021 08/21/21   Marrian Salvage, FNP  primidone (MYSOLINE) 50 MG tablet Take 1 tablet (50 mg total) by mouth 2 (two) times daily. 01/30/21   Copland, Gay Filler, MD  sildenafil (VIAGRA) 50 MG tablet TAKE 1 TABLET BY MOUTH  DAILY AS NEEDED FOR ERECTILE DYSFUNCTION. 01/01/21   Copland, Gay Filler, MD  terazosin (HYTRIN) 1 MG capsule Take 1 capsule (1 mg total) by mouth 2 (two) times daily. 01/30/21   Copland, Gay Filler, MD  Tetrahydrozoline HCl (VISINE OP) Place 1 drop into both eyes 2 (two) times daily.    [provider]  traMADol (ULTRAM) 50 MG tablet Take 1 tablet (50 mg total) by mouth every 12 (twelve) hours as needed. 06/27/21   Copland, Gay Filler, MD  triamcinolone cream (KENALOG) 0.1 % APPLY 1 APPLICATION TOPICALLY 2 (TWO) TIMES DAILY. 01/11/21   CoplandGay Filler, MD    Physical Exam: Vitals:   09/11/21 0038 09/11/21 0200 09/11/21 0330 09/11/21 0400  BP:  132/68 136/74   Pulse:  84 83   Resp:  18 19   Temp:  97.7 F (36.5 C)   97.7 F (36.5 C)  TempSrc: Oral   Oral  SpO2:  96% 93%   Weight:      Height:        Physical Exam Vitals reviewed.  Constitutional:      General: He is not in acute distress. HENT:     Head: Normocephalic and atraumatic.  Eyes:     Extraocular Movements: Extraocular movements intact.  Cardiovascular:     Rate and Rhythm: Normal rate and regular rhythm.     Pulses: Normal pulses.  Pulmonary:     Effort: Pulmonary effort is normal. No respiratory distress.     Breath sounds: No wheezing.  Abdominal:     General: Bowel sounds are normal. There is no distension.     Palpations: Abdomen is soft.     Tenderness: There is no abdominal tenderness.  Musculoskeletal:        General: No swelling or tenderness.     Cervical back: Normal range of motion.  Skin:    General: Skin is warm and dry.  Neurological:     General: No focal deficit present.     Mental Status: He is alert and oriented to person, place, and time.      Labs on Admission: I have personally reviewed following labs and imaging studies  CBC: Recent Labs  Lab 09/10/21 2131  WBC 7.2  NEUTROABS 5.2  HGB 12.7*  HCT 38.3*  MCV 97.5  PLT 433   Basic Metabolic Panel: Recent Labs  Lab  09/10/21 2131  NA 136  K 4.4  CL 101  CO2 24  GLUCOSE 203*  BUN 18  CREATININE 1.08  CALCIUM 9.2   GFR: Estimated Creatinine Clearance: 47.4 mL/min (by C-G formula based on SCr of 1.08 mg/dL). Liver Function Tests: Recent Labs  Lab 09/10/21 2131  AST 17  ALT 17  ALKPHOS 67  BILITOT 0.6  PROT 7.8  ALBUMIN 3.6   No results for input(s): "LIPASE", "AMYLASE" in the last 168 hours. No results for input(s): "AMMONIA" in the last 168 hours. Coagulation Profile: Recent Labs  Lab 09/10/21 2131  INR 1.1   Cardiac Enzymes: No results for input(s): "CKTOTAL", "CKMB", "CKMBINDEX", "TROPONINI" in the last 168 hours. BNP (last 3 results) No results for input(s): "PROBNP" in the last 8760 hours. HbA1C: No results for input(s): "HGBA1C" in the last 72 hours. CBG: No results for input(s): "GLUCAP" in the last 168 hours. Lipid Profile: No results for input(s): "CHOL", "HDL", "LDLCALC", "TRIG", "CHOLHDL", "LDLDIRECT" in the last 72 hours. Thyroid Function Tests: No results for input(s): "TSH", "T4TOTAL", "FREET4", "T3FREE", "THYROIDAB" in the last 72 hours. Anemia Panel: No results for input(s): "VITAMINB12", "FOLATE", "FERRITIN", "TIBC", "IRON", "RETICCTPCT" in the last 72 hours. Urine analysis:    Component Value Date/Time   COLORURINE YELLOW 09/10/2021 2131   APPEARANCEUR CLEAR 09/10/2021 2131   LABSPEC 1.016 09/10/2021 2131   PHURINE 5.0 09/10/2021 2131   GLUCOSEU NEGATIVE 09/10/2021 2131   HGBUR NEGATIVE 09/10/2021 2131   BILIRUBINUR NEGATIVE 09/10/2021 2131   KETONESUR NEGATIVE 09/10/2021 2131   PROTEINUR NEGATIVE 09/10/2021 2131   UROBILINOGEN 1.0 04/21/2014 1425   NITRITE NEGATIVE 09/10/2021 2131   LEUKOCYTESUR NEGATIVE 09/10/2021 2131    Radiological Exams on Admission: I have personally reviewed images CT Angio Chest PE W and/or Wo Contrast  Result Date: 09/11/2021 CLINICAL DATA:  Shortness of breath and cough EXAM: CT ANGIOGRAPHY CHEST WITH CONTRAST TECHNIQUE:  Multidetector CT imaging of the chest  was performed using the standard protocol during bolus administration of intravenous contrast. Multiplanar CT image reconstructions and MIPs were obtained to evaluate the vascular anatomy. RADIATION DOSE REDUCTION: This exam was performed according to the departmental dose-optimization program which includes automated exposure control, adjustment of the mA and/or kV according to patient size and/or use of iterative reconstruction technique. CONTRAST:  65m OMNIPAQUE IOHEXOL 350 MG/ML SOLN COMPARISON:  None Available. FINDINGS: Cardiovascular: Contrast injection is sufficient to demonstrate satisfactory opacification of the pulmonary arteries to the segmental level. There is no pulmonary embolus or evidence of right heart strain. The size of the main pulmonary artery is normal. Heart size is normal, with no pericardial effusion. The course and caliber of the aorta are normal. There is aortic atherosclerosis. Mediastinum/Nodes: No mediastinal, hilar or axillary lymphadenopathy. Normal visualized thyroid. Thoracic esophageal course is normal. Lungs/Pleura: Emphysema. Reticular opacities in the mid lungs have progressed since 07/11/2021. Upper Abdomen: Contrast bolus timing is not optimized for evaluation of the abdominal organs. The visualized portions of the organs of the upper abdomen are normal. Musculoskeletal: No chest wall abnormality. No bony spinal canal stenosis. Review of the MIP images confirms the above findings. IMPRESSION: 1. No pulmonary embolus or acute aortic syndrome. 2. Reticular opacities in the mid lungs have progressed since 07/11/2021. 3. Aortic Atherosclerosis (ICD10-I70.0) and Emphysema (ICD10-J43.9). Electronically Signed   By: KUlyses JarredM.D.   On: 09/11/2021 00:47   DG Chest 2 View  Result Date: 09/10/2021 CLINICAL DATA:  Shortness of breath, fatigue, cough EXAM: CHEST - 2 VIEW COMPARISON:  08/21/2021.  CT 07/11/2021 FINDINGS: Increased markings  and airspace opacity again noted in the right mid and lower lung, similar to prior study. Is likely reflects pneumonia superimposed on chronic lung disease. Heart is normal size. No effusions or acute bony abnormality. IMPRESSION: Continued right lower lobe airspace opacity concerning for pneumonia superimposed on chronic lung disease. Electronically Signed   By: KRolm BaptiseM.D.   On: 09/10/2021 21:31    EKG: Independently reviewed.  Sinus rhythm, no acute ischemic changes.  Assessment and Plan  Acute on chronic hypoxic respiratory failure secondary to acute exacerbation of asthma COPD overlap syndrome and possible community-acquired pneumonia Hypoxic to the 80s on 2 L home O2.  He received Solu-Medrol and bronchodilator treatments in the ED.  Currently stable on 5 L O2 and no longer wheezing. CTA chest negative for PE, showing reticular opacities in the mid lungs which have progressed since 07/11/2021.  SARS-CoV-2 PCR negative. No fever, leukocytosis, or lactic acidosis.  Tachycardia and tachypnea have resolved. -Prednisone 40 mg daily -Continue Trelegy -DuoNeb as needed -Mucinex as needed -Continue ceftriaxone and azithromycin -Check procalcitonin -Blood cultures pending  Type 2 diabetes A1c 6.3 on 06/07/2021. -Sensitive sliding scale insulin ACHS.  GERD Hypertension Raynaud's disease BPH -Pharmacy med rec pending.  DVT prophylaxis: Lovenox Code Status: DNR/DNI (discussed with the patient) Family Communication: No family available at this time. Level of care: Telemetry bed Admission status: It is my clinical opinion that admission to INPATIENT is reasonable and necessary because of the expectation that this patient will require hospital care that crosses at least 2 midnights to treat this condition based on the medical complexity of the problems presented.  Given the aforementioned information, the predictability of an adverse outcome is felt to be significant.   VShela Leff MD [facility]  If 7PM-7AM, please contact night-coverage [email or web address]  09/11/2021, 6:12 AM

## 2021-09-11 NOTE — Progress Notes (Signed)
Subjective: Patient admitted this morning, see detailed H&P by Dr Marlowe Sax 88 year old male with medical history of asthma, COPD, chronic hypoxemic respiratory failure on 2 L of oxygen at home, diabetes mellitus type 2, GERD, hypertension, Raynaud's disease, BPH who was diagnosed with pneumonia earlier this month and treated with doxycycline.  Came to ED with complaint of shortness of breath and cough.  Patient was requiring 45 L of oxygen via nasal cannula.  CT chest was negative for pulmonary embolism, showed rectal opacities in the mid lung.  Patient started on prednisone 40 mg daily, ceftriaxone and Zithromax.  Vitals:   09/11/21 1230 09/11/21 1300  BP: (!) 153/72 (!) 163/87  Pulse: 90 87  Resp: 20 (!) 27  Temp:    SpO2: 96% 98%      A/P  Acute on chronic hypoxemic respiratory failure -COPD exacerbation, questionable pneumonia -Continue DuoNebs, prednisone -Continue ceftriaxone and Zithromax -Follow-up procalcitonin level  Diabetes mellitus type 2 -Hemoglobin A1c 6.3 -Continue sensitive sliding scale insulin NovoLog   Hypertension -Continue amlodipine      Oswald Hillock [facility]

## 2021-09-11 NOTE — Progress Notes (Signed)
Dr. Darrick Meigs notified of critical result of lactic acid of 2.9.

## 2021-09-11 NOTE — Progress Notes (Signed)
Lactic acid 2.9.  Likely albuterol induced.  Vitals are stable, not septic Will repeat lactic acid in 4 hrs Follow blood culture results

## 2021-09-12 DIAGNOSIS — J189 Pneumonia, unspecified organism: Secondary | ICD-10-CM | POA: Diagnosis not present

## 2021-09-12 DIAGNOSIS — J441 Chronic obstructive pulmonary disease with (acute) exacerbation: Principal | ICD-10-CM

## 2021-09-12 DIAGNOSIS — J9621 Acute and chronic respiratory failure with hypoxia: Secondary | ICD-10-CM | POA: Diagnosis not present

## 2021-09-12 LAB — GLUCOSE, CAPILLARY
Glucose-Capillary: 167 mg/dL — ABNORMAL HIGH (ref 70–99)
Glucose-Capillary: 104 mg/dL — ABNORMAL HIGH (ref 70–99)
Glucose-Capillary: 231 mg/dL — ABNORMAL HIGH (ref 70–99)
Glucose-Capillary: 187 mg/dL — ABNORMAL HIGH (ref 70–99)

## 2021-09-12 LAB — CBC
HCT: 37.5 % — ABNORMAL LOW (ref 39.0–52.0)
Hemoglobin: 12.6 g/dL — ABNORMAL LOW (ref 13.0–17.0)
MCH: 32.6 pg (ref 26.0–34.0)
MCHC: 33.6 g/dL (ref 30.0–36.0)
MCV: 96.9 fL (ref 80.0–100.0)
Platelets: 176 10*3/uL (ref 150–400)
RBC: 3.87 MIL/uL — ABNORMAL LOW (ref 4.22–5.81)
RDW: 13.2 % (ref 11.5–15.5)
WBC: 7.4 10*3/uL (ref 4.0–10.5)
nRBC: 0 % (ref 0.0–0.2)

## 2021-09-12 LAB — BASIC METABOLIC PANEL
Anion gap: 8 (ref 5–15)
BUN: 19 mg/dL (ref 8–23)
CO2: 26 mmol/L (ref 22–32)
Calcium: 9 mg/dL (ref 8.9–10.3)
Chloride: 106 mmol/L (ref 98–111)
Creatinine, Ser: 0.95 mg/dL (ref 0.61–1.24)
GFR, Estimated: >60 mL/min (ref >60)
Glucose, Bld: 132 mg/dL — ABNORMAL HIGH (ref 70–99)
Potassium: 3.9 mmol/L (ref 3.5–5.1)
Sodium: 140 mmol/L (ref 135–145)

## 2021-09-12 MED ORDER — ALBUTEROL SULFATE (2.5 MG/3ML) 0.083% IN NEBU
2.5000 mg | INHALATION_SOLUTION | RESPIRATORY_TRACT | Status: DC | PRN
Start: 2021-09-12 — End: 2021-09-14

## 2021-09-12 MED ORDER — GUAIFENESIN-DM 100-10 MG/5ML PO SYRP
5.0000 mL | ORAL_SOLUTION | ORAL | Status: DC | PRN
  Administered 2021-09-12 – 2021-09-13 (×2): 5 mL via ORAL
  Filled 2021-09-12 (×2): qty 5

## 2021-09-12 MED ORDER — MOMETASONE FURO-FORMOTEROL FUM 200-5 MCG/ACT IN AERO
2.0000 | INHALATION_SPRAY | Freq: Two times a day (BID) | RESPIRATORY_TRACT | Status: DC
  Administered 2021-09-12 – 2021-09-14 (×4): 2 via RESPIRATORY_TRACT
  Filled 2021-09-12: qty 8.8

## 2021-09-12 MED ORDER — IPRATROPIUM-ALBUTEROL 0.5-2.5 (3) MG/3ML IN SOLN
3.0000 mL | Freq: Four times a day (QID) | RESPIRATORY_TRACT | Status: DC
  Administered 2021-09-12 (×2): 3 mL via RESPIRATORY_TRACT
  Filled 2021-09-12 (×2): qty 3

## 2021-09-12 MED ORDER — METHYLPREDNISOLONE SODIUM SUCC 40 MG IJ SOLR
40.0000 mg | INTRAMUSCULAR | Status: DC
Start: 2021-09-12 — End: 2021-09-13
  Administered 2021-09-12: 40 mg via INTRAVENOUS
  Filled 2021-09-12: qty 1

## 2021-09-12 NOTE — Progress Notes (Signed)
PROGRESS NOTE    Johnathan [name]  UUV:[id number] DOB: Nov 01, 1932 DOA: 09/10/2021 PCP: Darreld Mclean, MD   Brief Narrative:  88 y.o. male with medical history significant of asthma COPD overlap syndrome, chronic hypoxic respiratory failure on 2 L O2, type 2 diabetes, GERD, hypertension, Raynaud's disease, BPH, treated for pneumonia earlier this month with doxycycline presented with worsening shortness of breath.  On presentation, he was hypoxic to 80s on 2 L home oxygen and subsequently needed 4 to 5 L oxygen.  COVID-19 test was negative.  CTA chest was negative for PE but showed reticular opacities in the mid lungs progressed since 07/11/2021.  He was started on IV Solu-Medrol and antibiotics.  Assessment & Plan:   Acute on chronic hypoxic respiratory failure Exacerbation of asthma COPD overlap syndrome Possible community-acquired pneumonia -Wears 2 L oxygen at home but required 4 to 5 L oxygen on admission.  Imaging as above.  COVID-19 negative. -Continue Rocephin and Zithromax.  Procalcitonin less than 0.1. -Continue nebs.  Add Dulera.  Switch prednisone to IV Solu-Medrol. -Currently on 3 L oxygen via nasal cannula.  Wean off as able. -Might need outpatient pulmonary evaluation  Lactic acidosis -Resolved  Anemia of chronic disease -From chronic illnesses.  Hemoglobin stable.  Diabetes mellitus type 2 with hyperglycemia -CBGs with SSI  BPH -Continue terazosin  Hypertension -Monitor blood pressure.  Continue amlodipine  Physical deconditioning -PT eval   DVT prophylaxis: Lovenox Code Status: DNR Family Communication: None at bedside disposition Plan: Status is: Inpatient Remains inpatient appropriate because: Of severity of illness.   Consultants: None  Procedures: None  Antimicrobials: Rocephin and Zithromax from 09/10/2021   Subjective: Patient seen and examined at bedside.  Feels slightly better but still intermittently short of breath and coughing.  No fever,  chest pain, vomiting reported.  Objective: Vitals:   09/11/21 2036 09/12/21 0129 09/12/21 0603 09/12/21 0832  BP:  133/61 (!) 143/86   Pulse: 88 74 71   Resp: 20 (!) 22 (!) 21   Temp:  97.6 F (36.4 C) 97.8 F (36.6 C)   TempSrc:      SpO2: 93% 94% 95% 94%  Weight:      Height:        Intake/Output Summary (Last 24 hours) at 09/12/2021 1045 Last data filed at 09/12/2021 0900 Gross per 24 hour  Intake 810 ml  Output 1250 ml  Net -440 ml   Filed Weights   09/10/21 2049  Weight: 68.2 kg    Examination:  General exam: Appears calm and comfortable.  Currently on 3 L oxygen by nasal cannula.  Elderly male lying in bed. Respiratory system: Bilateral decreased breath sounds at bases with scattered crackles and some wheezing, intermittently tachypneic Cardiovascular system: S1 & S2 heard, Rate controlled Gastrointestinal system: Abdomen is nondistended, soft and nontender. Normal bowel sounds heard. Extremities: No cyanosis, clubbing, edema  Central nervous system: Alert and oriented. No focal neurological deficits. Moving extremities Skin: No rashes, lesions or ulcers Psychiatry: Judgement and insight appear normal. Mood & affect appropriate.     Data Reviewed: I have personally reviewed following labs and imaging studies  CBC: Recent Labs  Lab 09/10/21 2131 09/12/21 0644  WBC 7.2 7.4  NEUTROABS 5.2  --   HGB 12.7* 12.6*  HCT 38.3* 37.5*  MCV 97.5 96.9  PLT 193 474   Basic Metabolic Panel: Recent Labs  Lab 09/10/21 2131 09/12/21 0644  NA 136 140  K 4.4 3.9  CL 101 106  CO2  24 26  GLUCOSE 203* 132*  BUN 18 19  CREATININE 1.08 0.95  CALCIUM 9.2 9.0   GFR: Estimated Creatinine Clearance: 53.8 mL/min (by C-G formula based on SCr of 0.95 mg/dL). Liver Function Tests: Recent Labs  Lab 09/10/21 2131  AST 17  ALT 17  ALKPHOS 67  BILITOT 0.6  PROT 7.8  ALBUMIN 3.6   No results for input(s): "LIPASE", "AMYLASE" in the last 168 hours. No results for  input(s): "AMMONIA" in the last 168 hours. Coagulation Profile: Recent Labs  Lab 09/10/21 2131  INR 1.1   Cardiac Enzymes: No results for input(s): "CKTOTAL", "CKMB", "CKMBINDEX", "TROPONINI" in the last 168 hours. BNP (last 3 results) No results for input(s): "PROBNP" in the last 8760 hours. HbA1C: No results for input(s): "HGBA1C" in the last 72 hours. CBG: Recent Labs  Lab 09/11/21 0756 09/11/21 1143 09/11/21 1714 09/11/21 2006 09/12/21 0814  GLUCAP 220* 170* 152* 145* 167*   Lipid Profile: No results for input(s): "CHOL", "HDL", "LDLCALC", "TRIG", "CHOLHDL", "LDLDIRECT" in the last 72 hours. Thyroid Function Tests: No results for input(s): "TSH", "T4TOTAL", "FREET4", "T3FREE", "THYROIDAB" in the last 72 hours. Anemia Panel: No results for input(s): "VITAMINB12", "FOLATE", "FERRITIN", "TIBC", "IRON", "RETICCTPCT" in the last 72 hours. Sepsis Labs: Recent Labs  Lab 09/10/21 2152 09/11/21 1530 09/11/21 1950  PROCALCITON  --  <0.10  --   LATICACIDVEN 1.8 2.9* 1.5    Recent Results (from the past 240 hour(s))  SARS Coronavirus 2 by RT PCR (hospital order, performed in [facility] [place] hospital lab) *cepheid single result test* Anterior Nasal Swab     Status: None   Collection Time: 09/10/21  8:43 PM   Specimen: Anterior Nasal Swab  Result Value Ref Range Status   SARS Coronavirus 2 by RT PCR NEGATIVE NEGATIVE Final    Comment: (NOTE) SARS-CoV-2 target nucleic acids are NOT DETECTED.  The SARS-CoV-2 RNA is generally detectable in upper and lower respiratory specimens during the acute phase of infection. The lowest concentration of SARS-CoV-2 viral copies this assay can detect is 250 copies / mL. A negative result does not preclude SARS-CoV-2 infection and should not be used as the sole basis for treatment or other patient management decisions.  A negative result may occur with improper specimen collection / handling, submission of specimen other than nasopharyngeal  swab, presence of viral mutation(s) within the areas targeted by this assay, and inadequate number of viral copies (<250 copies / mL). A negative result must be combined with clinical observations, patient history, and epidemiological information.  Fact Sheet for Patients:   https://www.patel.info/  Fact Sheet for Healthcare Providers: https://hall.com/  This test is not yet approved or  cleared by the [place] FDA and has been authorized for detection and/or diagnosis of SARS-CoV-2 by FDA under an Emergency Use Authorization (EUA).  This EUA will remain in effect (meaning this test can be used) for the duration of the COVID-19 declaration under Section 564(b)(1) of the Act, 21 U.S.C. section 360bbb-3(b)(1), unless the authorization is terminated or revoked sooner.  Performed at [facility], [place] [address] [place], [place] [address]   Blood Culture (routine x 2)     Status: None (Preliminary result)   Collection Time: 09/10/21  9:31 PM   Specimen: BLOOD  Result Value Ref Range Status   Specimen Description   Final    BLOOD RIGHT ANTECUBITAL Performed at [place] [address] [place], [place] [address]    Special Requests  Final    BOTTLES DRAWN AEROBIC AND ANAEROBIC Blood Culture adequate volume Performed at [place] [address] [place], [place] [address]    Culture   Final    NO GROWTH 2 DAYS Performed at [place] [address] [place], [place] [address]    Report Status PENDING  Incomplete  Urine Culture     Status: Abnormal (Preliminary result)   Collection Time: 09/10/21  9:31 PM   Specimen: In/Out Cath Urine  Result Value Ref Range Status   Specimen Description   Final    IN/OUT CATH URINE Performed at [place] [address], , [place] [address]    Special Requests   Final    NONE Performed at  [facility], [place] [address] [place], [place] [address]    Culture (A)  Final    3,000 COLONIES/mL STAPHYLOCOCCUS EPIDERMIDIS SUSCEPTIBILITIES TO FOLLOW Performed at [facility], [place] [address], [place], [place] [address]    Report Status PENDING  Incomplete  Blood Culture (routine x 2)     Status: None (Preliminary result)   Collection Time: 09/10/21 10:45 PM   Specimen: BLOOD  Result Value Ref Range Status   Specimen Description   Final    BLOOD BLOOD RIGHT FOREARM Performed at [place] [address], [place], [place] [address]    Special Requests   Final    BOTTLES DRAWN AEROBIC AND ANAEROBIC Blood Culture results may not be optimal due to an excessive volume of blood received in culture bottles Performed at [place] [address], [place], [place] [address]    Culture   Final    NO GROWTH 1 DAY Performed at [facility], [place] [address] [place],  [address]    Report Status PENDING  Incomplete         Radiology Studies: CT Angio Chest PE W and/or Wo Contrast  Result Date: 09/11/2021 CLINICAL DATA:  Shortness of breath and cough EXAM: CT ANGIOGRAPHY CHEST WITH CONTRAST TECHNIQUE: Multidetector CT imaging of the chest was performed using the standard protocol during bolus administration of intravenous contrast. Multiplanar CT image reconstructions and MIPs were obtained to evaluate the vascular anatomy. RADIATION DOSE REDUCTION: This exam was performed according to the departmental dose-optimization program which includes automated exposure control, adjustment of the mA and/or kV according to patient size and/or use of iterative reconstruction technique. CONTRAST:  67m OMNIPAQUE IOHEXOL 350 MG/ML SOLN COMPARISON:  None Available. FINDINGS: Cardiovascular: Contrast injection is sufficient to demonstrate satisfactory opacification of the pulmonary arteries to the segmental level. There is no  pulmonary embolus or evidence of right heart strain. The size of the main pulmonary artery is normal. Heart size is normal, with no pericardial effusion. The course and caliber of the aorta are normal. There is aortic atherosclerosis. Mediastinum/Nodes: No mediastinal, hilar or axillary lymphadenopathy. Normal visualized thyroid. Thoracic esophageal course is normal. Lungs/Pleura: Emphysema. Reticular opacities in the mid lungs have progressed since 07/11/2021. Upper Abdomen: Contrast bolus timing is not optimized for evaluation of the abdominal organs. The visualized portions of the organs of the upper abdomen are normal. Musculoskeletal: No chest wall abnormality. No bony spinal canal stenosis. Review of the MIP images confirms the above findings. IMPRESSION: 1. No pulmonary embolus or acute aortic syndrome. 2. Reticular opacities in the mid lungs have progressed since 07/11/2021. 3. Aortic Atherosclerosis (ICD10-I70.0) and Emphysema (ICD10-J43.9). Electronically Signed   By:  Ulyses Jarred M.D.   On: 09/11/2021 00:47   DG Chest 2 View  Result Date: 09/10/2021 CLINICAL DATA:  Shortness of breath, fatigue, cough EXAM: CHEST - 2 VIEW COMPARISON:  08/21/2021.  CT 07/11/2021 FINDINGS: Increased markings and airspace opacity again noted in the right mid and lower lung, similar to prior study. Is likely reflects pneumonia superimposed on chronic lung disease. Heart is normal size. No effusions or acute bony abnormality. IMPRESSION: Continued right lower lobe airspace opacity concerning for pneumonia superimposed on chronic lung disease. Electronically Signed   By: Rolm Baptise M.D.   On: 09/10/2021 21:31        Scheduled Meds:  amLODipine  5 mg Oral q morning   aspirin EC  81 mg Oral q morning   enoxaparin (LOVENOX) injection  40 mg Subcutaneous Q24H   guaiFENesin  600 mg Oral BID   insulin aspart  0-5 Units Subcutaneous QHS   insulin aspart  0-9 Units Subcutaneous TID WC   predniSONE  40 mg Oral Q  breakfast   primidone  50 mg Oral BID   terazosin  1 mg Oral BID   Continuous Infusions:  azithromycin 500 mg (09/11/21 2300)   cefTRIAXone (ROCEPHIN)  IV 1 g (09/11/21 2103)          Aline August, MD [facility] 09/12/2021, 10:45 AM

## 2021-09-12 NOTE — Progress Notes (Signed)
Mobility Specialist - Progress Note   09/12/21 1029  Mobility  Activity Ambulated with assistance in hallway  Level of Assistance Modified independent, requires aide device or extra time  Assistive Device Front wheel walker  Distance Ambulated (ft) 500 ft  Activity Response Tolerated well  $Mobility charge 1 Mobility   Pt received in chair and agreed to mobilize. No c/o pain or dizziness. One coughing spell during ambulation. Left in chair with all necessities in reach and call bell in hand.   Pre-mobility: 94% SpO2 Post-mobility:  94% SPO2  [name]

## 2021-09-12 NOTE — Progress Notes (Signed)
Mobility Specialist - Progress Note   09/12/21 1433  Mobility  Activity Ambulated with assistance in hallway  Level of Assistance Modified independent, requires aide device or extra time  Assistive Device Front wheel walker  Distance Ambulated (ft) 1500 ft  Activity Response Tolerated well  $Mobility charge 1 Mobility   Pt received in bed and agreed for mobility. No c/o pain or dizziness. No LOB or SOB. Two coughing spells during ambulation. Pt left in bed with call bell within reach, and family/friends in room.  Roderick Pee Mobility Specialist

## 2021-09-13 DIAGNOSIS — J189 Pneumonia, unspecified organism: Secondary | ICD-10-CM | POA: Diagnosis not present

## 2021-09-13 DIAGNOSIS — J441 Chronic obstructive pulmonary disease with (acute) exacerbation: Secondary | ICD-10-CM | POA: Diagnosis not present

## 2021-09-13 DIAGNOSIS — J9621 Acute and chronic respiratory failure with hypoxia: Secondary | ICD-10-CM | POA: Diagnosis not present

## 2021-09-13 LAB — URINE CULTURE: Culture: 3000 — AB

## 2021-09-13 LAB — GLUCOSE, CAPILLARY
Glucose-Capillary: 122 mg/dL — ABNORMAL HIGH (ref 70–99)
Glucose-Capillary: 122 mg/dL — ABNORMAL HIGH (ref 70–99)
Glucose-Capillary: 205 mg/dL — ABNORMAL HIGH (ref 70–99)
Glucose-Capillary: 163 mg/dL — ABNORMAL HIGH (ref 70–99)

## 2021-09-13 MED ORDER — IPRATROPIUM-ALBUTEROL 0.5-2.5 (3) MG/3ML IN SOLN
3.0000 mL | Freq: Three times a day (TID) | RESPIRATORY_TRACT | Status: DC
  Administered 2021-09-13 – 2021-09-14 (×5): 3 mL via RESPIRATORY_TRACT
  Filled 2021-09-13 (×5): qty 3

## 2021-09-13 MED ORDER — METHYLPREDNISOLONE SODIUM SUCC 40 MG IJ SOLR
40.0000 mg | Freq: Two times a day (BID) | INTRAMUSCULAR | Status: DC
  Administered 2021-09-13 – 2021-09-14 (×3): 40 mg via INTRAVENOUS
  Filled 2021-09-13 (×3): qty 1

## 2021-09-13 MED ORDER — GUAIFENESIN-CODEINE 100-10 MG/5ML PO SOLN
5.0000 mL | ORAL | Status: DC | PRN
  Administered 2021-09-14: 5 mL via ORAL
  Filled 2021-09-13: qty 5

## 2021-09-13 NOTE — Progress Notes (Signed)
PROGRESS NOTE    Johnathan [name]  JSH:[id number] DOB: 1932/08/02 DOA: 09/10/2021 PCP: Darreld Mclean, MD   Brief Narrative:  88 y.o. male with medical history significant of asthma COPD overlap syndrome, chronic hypoxic respiratory failure on 2 L O2, type 2 diabetes, GERD, hypertension, Raynaud's disease, BPH, treated for pneumonia earlier this month with doxycycline presented with worsening shortness of breath.  On presentation, he was hypoxic to 80s on 2 L home oxygen and subsequently needed 4 to 5 L oxygen.  COVID-19 test was negative.  CTA chest was negative for PE but showed reticular opacities in the mid lungs progressed since 07/11/2021.  He was started on IV Solu-Medrol and antibiotics.  Assessment & Plan:   Acute on chronic hypoxic respiratory failure Exacerbation of asthma COPD overlap syndrome Possible community-acquired pneumonia -Wears 2 L oxygen at home but required 4 to 5 L oxygen on admission.  Imaging as above.  COVID-19 negative. -Continue Rocephin and Zithromax.  Procalcitonin less than 0.1. -Continue nebs and Dulera.  Still wheezing and complaining of excessive cough.  Increase Solu-Medrol to 40 mg IV every 12 hours. -Currently on 2 L oxygen via nasal cannula.   -Might need outpatient pulmonary evaluation  Lactic acidosis -Resolved  Anemia of chronic disease -From chronic illnesses.  Hemoglobin stable.  Diabetes mellitus type 2 with hyperglycemia -CBGs with SSI  BPH -Continue terazosin  Hypertension -Monitor blood pressure.  Continue amlodipine  Physical deconditioning -PT eval   DVT prophylaxis: Lovenox Code Status: DNR Family Communication: None at bedside  disposition Plan: Status is: Inpatient Remains inpatient appropriate because: Of severity of illness.   Consultants: None  Procedures: None  Antimicrobials: Rocephin and Zithromax from 09/10/2021   Subjective: Patient seen and examined at bedside.  Still coughing a lot and does not feel  ready to go home today.  No overnight fever, vomiting, chest pain reported. Objective: Vitals:   09/12/21 1700 09/12/21 2144 09/13/21 0632 09/13/21 0855  BP:  (!) 146/78 (!) 154/78   Pulse:  83 71   Resp:  (!) 21 20   Temp:  (!) 97.4 F (36.3 C) 97.7 F (36.5 C)   TempSrc:      SpO2: 95% 97% 98% 97%  Weight:      Height:        Intake/Output Summary (Last 24 hours) at 09/13/2021 1103 Last data filed at 09/13/2021 0816 Gross per 24 hour  Intake 830.5 ml  Output 600 ml  Net 230.5 ml    Filed Weights   09/10/21 2049  Weight: 68.2 kg    Examination:  General: On 2 L oxygen via nasal cannula.  Elderly male lying in bed.  No distress ENT/neck: No thyromegaly.  JVD is not elevated  respiratory: Decreased breath sounds at bases bilaterally with some crackles; diffuse wheezing present.  Intermittently tachypneic  CVS: S1-S2 heard, rate controlled currently Abdominal: Soft, nontender, slightly distended; no organomegaly, bowel sounds are heard Extremities: Trace lower extremity edema; no cyanosis  CNS: Awake and alert.  No focal neurologic deficit.  Moves extremities Lymph: No obvious lymphadenopathy Skin: No obvious ecchymosis/lesions  psych: Affect, judgment and mood are normal  musculoskeletal: No obvious joint swelling/deformity     Data Reviewed: I have personally reviewed following labs and imaging studies  CBC: Recent Labs  Lab 09/10/21 2131 09/12/21 0644  WBC 7.2 7.4  NEUTROABS 5.2  --   HGB 12.7* 12.6*  HCT 38.3* 37.5*  MCV 97.5 96.9  PLT 193 176  Basic Metabolic Panel: Recent Labs  Lab 09/10/21 2131 09/12/21 0644  NA 136 140  K 4.4 3.9  CL 101 106  CO2 24 26  GLUCOSE 203* 132*  BUN 18 19  CREATININE 1.08 0.95  CALCIUM 9.2 9.0    GFR: Estimated Creatinine Clearance: 53.8 mL/min (by C-G formula based on SCr of 0.95 mg/dL). Liver Function Tests: Recent Labs  Lab 09/10/21 2131  AST 17  ALT 17  ALKPHOS 67  BILITOT 0.6  PROT 7.8   ALBUMIN 3.6    No results for input(s): "LIPASE", "AMYLASE" in the last 168 hours. No results for input(s): "AMMONIA" in the last 168 hours. Coagulation Profile: Recent Labs  Lab 09/10/21 2131  INR 1.1    Cardiac Enzymes: No results for input(s): "CKTOTAL", "CKMB", "CKMBINDEX", "TROPONINI" in the last 168 hours. BNP (last 3 results) No results for input(s): "PROBNP" in the last 8760 hours. HbA1C: No results for input(s): "HGBA1C" in the last 72 hours. CBG: Recent Labs  Lab 09/12/21 0814 09/12/21 1148 09/12/21 1718 09/12/21 2141 09/13/21 0808  GLUCAP 167* 104* 231* 187* 122*    Lipid Profile: No results for input(s): "CHOL", "HDL", "LDLCALC", "TRIG", "CHOLHDL", "LDLDIRECT" in the last 72 hours. Thyroid Function Tests: No results for input(s): "TSH", "T4TOTAL", "FREET4", "T3FREE", "THYROIDAB" in the last 72 hours. Anemia Panel: No results for input(s): "VITAMINB12", "FOLATE", "FERRITIN", "TIBC", "IRON", "RETICCTPCT" in the last 72 hours. Sepsis Labs: Recent Labs  Lab 09/10/21 2152 09/11/21 1530 09/11/21 1950  PROCALCITON  --  <0.10  --   LATICACIDVEN 1.8 2.9* 1.5     Recent Results (from the past 240 hour(s))  SARS Coronavirus 2 by RT PCR (hospital order, performed in [facility] hospital lab) *cepheid single result test* Anterior Nasal Swab     Status: None   Collection Time: 09/10/21  8:43 PM   Specimen: Anterior Nasal Swab  Result Value Ref Range Status   SARS Coronavirus 2 by RT PCR NEGATIVE NEGATIVE Final    Comment: (NOTE) SARS-CoV-2 target nucleic acids are NOT DETECTED.  The SARS-CoV-2 RNA is generally detectable in upper and lower respiratory specimens during the acute phase of infection. The lowest concentration of SARS-CoV-2 viral copies this assay can detect is 250 copies / mL. A negative result does not preclude SARS-CoV-2 infection and should not be used as the sole basis for treatment or other patient management decisions.  A negative result  may occur with improper specimen collection / handling, submission of specimen other than nasopharyngeal swab, presence of viral mutation(s) within the areas targeted by this assay, and inadequate number of viral copies (<250 copies / mL). A negative result must be combined with clinical observations, patient history, and epidemiological information.  Fact Sheet for Patients:   https://www.patel.info/  Fact Sheet for Healthcare Providers: https://hall.com/  This test is not yet approved or  cleared by the [place] FDA and has been authorized for detection and/or diagnosis of SARS-CoV-2 by FDA under an Emergency Use Authorization (EUA).  This EUA will remain in effect (meaning this test can be used) for the duration of the COVID-19 declaration under Section 564(b)(1) of the Act, 21 U.S.C. section 360bbb-3(b)(1), unless the authorization is terminated or revoked sooner.  Performed at [facility], [place] [address], [place], [place] [address]   Blood Culture (routine x 2)     Status: None (Preliminary result)   Collection Time: 09/10/21  9:31 PM   Specimen: BLOOD  Result Value Ref Range Status  Specimen Description   Final    BLOOD RIGHT ANTECUBITAL Performed at [place] [address] [place], [place] [address]    Special Requests   Final    BOTTLES DRAWN AEROBIC AND ANAEROBIC Blood Culture adequate volume Performed at [place] [address], [place], [place] [address]    Culture   Final    NO GROWTH 3 DAYS Performed at [facility], [place] [address] [place], [place] [address]    Report Status PENDING  Incomplete  Urine Culture     Status: Abnormal   Collection Time: 09/10/21  9:31 PM   Specimen: In/Out Cath Urine  Result Value Ref Range Status   Specimen Description   Final    IN/OUT CATH URINE Performed at [place]  [address], [place], [place] [address]    Special Requests   Final    NONE Performed at [facility], [place] [address], [place], [place] [address]    Culture 3,000 COLONIES/mL STAPHYLOCOCCUS EPIDERMIDIS (A)  Final   Report Status 09/13/2021 FINAL  Final   Organism ID, Bacteria STAPHYLOCOCCUS EPIDERMIDIS (A)  Final      Susceptibility   Staphylococcus epidermidis - MIC*    CIPROFLOXACIN <=0.5 SENSITIVE Sensitive     GENTAMICIN <=0.5 SENSITIVE Sensitive     NITROFURANTOIN <=16 SENSITIVE Sensitive     OXACILLIN >=4 RESISTANT Resistant     TETRACYCLINE >=16 RESISTANT Resistant     VANCOMYCIN 1 SENSITIVE Sensitive     TRIMETH/SULFA 80 RESISTANT Resistant     CLINDAMYCIN >=8 RESISTANT Resistant     RIFAMPIN <=0.5 SENSITIVE Sensitive     Inducible Clindamycin NEGATIVE Sensitive     * 3,000 COLONIES/mL STAPHYLOCOCCUS EPIDERMIDIS  Blood Culture (routine x 2)     Status: None (Preliminary result)   Collection Time: 09/10/21 10:45 PM   Specimen: BLOOD  Result Value Ref Range Status   Specimen Description   Final    BLOOD BLOOD RIGHT FOREARM Performed at [place] [address] [place], [place] [address]    Special Requests   Final    BOTTLES DRAWN AEROBIC AND ANAEROBIC Blood Culture results may not be optimal due to an excessive volume of blood received in culture bottles Performed at [place] [address], [place], [place] [address]    Culture   Final    NO GROWTH 2 DAYS Performed at [place] [address], [place],  [address]    Report Status PENDING  Incomplete         Radiology Studies: No results found.      Scheduled Meds:  amLODipine  5 mg Oral q morning   aspirin EC  81 mg Oral q morning   enoxaparin (LOVENOX) injection  40 mg Subcutaneous Q24H   guaiFENesin  600 mg Oral BID   insulin aspart  0-5 Units Subcutaneous QHS   insulin aspart  0-9 Units Subcutaneous TID WC    ipratropium-albuterol  3 mL Nebulization TID   methylPREDNISolone (SOLU-MEDROL) injection  40 mg Intravenous Q24H   mometasone-formoterol  2 puff Inhalation BID   primidone  50 mg Oral BID   terazosin  1 mg Oral BID   Continuous Infusions:  azithromycin 500 mg (09/12/21 2116)   cefTRIAXone (ROCEPHIN)  IV 10 mL/hr at 09/13/21 5053          Aline August, MD [facility] 09/13/2021, 11:03 AM

## 2021-09-13 NOTE — Progress Notes (Signed)
Patient Details Name: Johnathan [name] MRN: [id number] DOB: 03-24-1932   SATURATION QUALIFICATIONS: (This note is used to comply with regulatory documentation for home oxygen)  Patient Saturations on Room Air at Rest = 95%  Patient Saturations on Room Air while Ambulating = 86%  Patient Saturations on 2 Liters of oxygen while Ambulating = 93%  Please briefly explain why patient needs home oxygen: to improve oxygen saturations during physical activities such as ambulation   [place] 09/13/2021, 11:27 AM Jannette Spanner PT, DPT Physical Therapist Acute Rehabilitation Services Preferred contact method: [place] Weekend Pager Only: [phone] Office: [phone]

## 2021-09-13 NOTE — Evaluation (Signed)
Physical Therapy Evaluation Patient Details Name: Johnathan [name] MRN: [id number] DOB: [birth year] Today's Date: 09/13/2021  History of Present Illness  88 y.o. male with medical history significant of asthma COPD overlap syndrome, chronic hypoxic respiratory failure on 2 L O2, Covid-19, type 2 diabetes, GERD, hypertension, Raynaud's disease, BPH, treated for pneumonia earlier this month with doxycycline presented with worsening shortness of breath.  Pt admitted 09/10/21 with Acute on chronic hypoxic respiratory failure, Exacerbation of asthma COPD overlap syndrome  Clinical Impression  Patient evaluated by Physical Therapy with no further acute PT needs identified. All education has been completed and the patient has no further questions.  Pt ambulated good distance in hallway.  Pt required supplemental oxygen and cues for pursed lip breathing.  Pt reports he was on oxygen in the past however self weaned ("I never told anyone I came off the oxygen") and states he has pulse oximeter at home and SPO2 typically in the 90s on room air.  Pt currently requiring supplemental oxygen and discussed pt will likely need to use oxygen again upon d/c.  Pt would benefit from continued mobility with staff however no further acute PT needs identified. See below for any follow-up Physical Therapy or equipment needs. PT is signing off. Thank you for this referral.        Recommendations for follow up therapy are one component of a multi-disciplinary discharge planning process, led by the attending physician.  Recommendations may be updated based on patient status, additional functional criteria and insurance authorization.  Follow Up Recommendations No PT follow up      Assistance Recommended at Discharge PRN  Patient can return home with the following  Help with stairs or ramp for entrance    Equipment Recommendations None recommended by PT  Recommendations for Other Services       Functional Status Assessment  Patient has not had a recent decline in their functional status     Precautions / Restrictions Precautions Precaution Comments: monitor sats      Mobility  Bed Mobility Overal bed mobility: Modified Independent                  Transfers Overall transfer level: Needs assistance Equipment used: Rolling walker (2 wheels) Transfers: Sit to/from Stand Sit to Stand: Supervision                Ambulation/Gait Ambulation/Gait assistance: Min guard, Supervision Gait Distance (Feet): 400 Feet Assistive device: Rolling walker (2 wheels) Gait Pattern/deviations: Step-through pattern, Decreased stride length       General Gait Details: pt requested use of RW for safety, has RW at home, monitored SPO2 and pt required supplemental oxygen (see progress note)  Stairs            Wheelchair Mobility    Modified Rankin (Stroke Patients Only)       Balance                                             Pertinent Vitals/Pain Pain Assessment Pain Assessment: No/denies pain    Home Living Family/patient expects to be discharged to:: Private residence Living Arrangements: Non-relatives/Friends   Type of Home: House         Home Layout: Able to live on main level with bedroom/bathroom Home Equipment: Conservation officer, nature (2 wheels)      Prior Function Prior Level of  Function : Independent/Modified Independent                     Hand Dominance        Extremity/Trunk Assessment        Lower Extremity Assessment Lower Extremity Assessment: Generalized weakness    Cervical / Trunk Assessment Cervical / Trunk Assessment: Normal  Communication   Communication: No difficulties  Cognition Arousal/Alertness: Awake/alert Behavior During Therapy: WFL for tasks assessed/performed Overall Cognitive Status: Within Functional Limits for tasks assessed                                          General Comments       Exercises     Assessment/Plan    PT Assessment Patient does not need any further PT services  PT Problem List         PT Treatment Interventions      PT Goals (Current goals can be found in the Care Plan section)  Acute Rehab PT Goals PT Goal Formulation: All assessment and education complete, DC therapy    Frequency       Co-evaluation               AM-PAC PT "6 Clicks" Mobility  Outcome Measure Help needed turning from your back to your side while in a flat bed without using bedrails?: None Help needed moving from lying on your back to sitting on the side of a flat bed without using bedrails?: None Help needed moving to and from a bed to a chair (including a wheelchair)?: None Help needed standing up from a chair using your arms (e.g., wheelchair or bedside chair)?: None Help needed to walk in hospital room?: A Little Help needed climbing 3-5 steps with a railing? : A Little 6 Click Score: 22    End of Session Equipment Utilized During Treatment: Oxygen Activity Tolerance: Patient tolerated treatment well Patient left: in chair;with call bell/phone within reach Nurse Communication: Mobility status PT Visit Diagnosis: Difficulty in walking, not elsewhere classified (R26.2)    Time: 0092-3300 PT Time Calculation (min) (ACUTE ONLY): 26 min   Charges:   PT Evaluation $PT Eval Low Complexity: 1 Low PT Treatments $Gait Training: 8-22 mins   Jannette Spanner PT, DPT Physical Therapist Acute Rehabilitation Services Preferred contact method: Secure Chat Weekend Pager Only: [phone] Office: [place] 09/13/2021, 11:25 AM

## 2021-09-13 NOTE — Care Management Important Message (Signed)
Important Message  Patient Details IM Letter given to the Patient. Name: Johnathan [name] MRN: [id number] Date of Birth: 02/06/1932   Medicare Important Message Given:  Yes     Kerin Salen 09/13/2021, 9:04 AM

## 2021-09-13 NOTE — Progress Notes (Signed)
Mobility Specialist - Progress Note   09/13/21 1323  Mobility  Activity Ambulated with assistance in hallway  Level of Assistance Modified independent, requires aide device or extra time  Assistive Device Front wheel walker  Distance Ambulated (ft) 1200 ft  Activity Response Tolerated well  $Mobility charge 1 Mobility   Pt received in bed and agreed for mobility. No c/o pain or discomfort during ambulation. No coughing spells during ambulation. Pt left in chair with all needs met and call bell within reach.   Pre-mobility: 96% SpO2 Post-mobility:  95% SPO2  Johnathan [name]

## 2021-09-14 LAB — GLUCOSE, CAPILLARY
Glucose-Capillary: 173 mg/dL — ABNORMAL HIGH (ref 70–99)
Glucose-Capillary: 170 mg/dL — ABNORMAL HIGH (ref 70–99)

## 2021-09-14 MED ORDER — GUAIFENESIN ER 600 MG PO TB12: 1200.0000 mg | ORAL_TABLET | Freq: Two times a day (BID) | ORAL | 0 refills | Status: DC

## 2021-09-14 MED ORDER — TRIAMCINOLONE ACETONIDE 0.1 % EX CREA: 1.0000 | TOPICAL_CREAM | Freq: Two times a day (BID) | CUTANEOUS | Status: AC | PRN

## 2021-09-14 MED ORDER — TRELEGY ELLIPTA 100-62.5-25 MCG/ACT IN AEPB: 1.0000 | INHALATION_SPRAY | Freq: Every day | RESPIRATORY_TRACT | Status: DC

## 2021-09-14 MED ORDER — SODIUM CHLORIDE 0.9 % IV SOLN
1.0000 g | INTRAVENOUS | Status: AC
  Administered 2021-09-14: 1 g via INTRAVENOUS
  Filled 2021-09-14: qty 10

## 2021-09-14 MED ORDER — PREDNISONE 20 MG PO TABS: 40.0000 mg | ORAL_TABLET | Freq: Every day | ORAL | 0 refills | Status: AC

## 2021-09-14 MED ORDER — SODIUM CHLORIDE 0.9 % IV SOLN
500.0000 mg | INTRAVENOUS | Status: AC
  Administered 2021-09-14: 500 mg via INTRAVENOUS
  Filled 2021-09-14: qty 5

## 2021-09-14 NOTE — Progress Notes (Signed)
Mobility Specialist - Progress Note   09/14/21 1100  Mobility  Activity Ambulated with assistance in hallway  Level of Assistance Independent after set-up  Assistive Device Front wheel walker  Distance Ambulated (ft) 1000 ft  Activity Response Tolerated well  $Mobility charge 1 Mobility   Pt received in bed and agreed for mobility. No c/o pain or discomfort during ambulation. Pt left in chair with all needs met and RN in room.   Chase Moore Mobility Specialist   

## 2021-09-14 NOTE — Plan of Care (Signed)

## 2021-09-14 NOTE — Discharge Summary (Signed)
Physician Discharge Summary  Johnathan [name] DUK:[id number] DOB: April 25, 1932 DOA: 09/10/2021  PCP: Darreld Mclean, MD  Admit date: 09/10/2021 Discharge date: 09/14/2021  Admitted From: Home Disposition: Home  Recommendations for Outpatient Follow-up:  Follow up with PCP in 1 week with repeat CBC/BMP Outpatient follow-up with pulmonary/Dr. Mannam in 1 to 2 weeks Follow up in ED if symptoms worsen or new appear   Home Health: No Equipment/Devices: Continue oxygen by nasal cannula at 2 L/min.  Patient  has oxygen at home.  Discharge Condition: Stable CODE STATUS: DNR Diet recommendation: Heart healthy/carb modified  Brief/Interim Summary: 88 y.o. male with medical history significant of asthma COPD overlap syndrome, chronic hypoxic respiratory failure on 2 L O2, type 2 diabetes, GERD, hypertension, Raynaud's disease, BPH, treated for pneumonia earlier this month with doxycycline presented with worsening shortness of breath.  On presentation, he was hypoxic to 80s on 2 L home oxygen and subsequently needed 4 to 5 L oxygen.  COVID-19 test was negative.  CTA chest was negative for PE but showed reticular opacities in the mid lungs progressed since 07/11/2021.  He was started on IV Solu-Medrol and antibiotics.  During the hospitalization, his condition is improved.  His respiratory status has remained stable and he is on 2 L oxygen.  He feels better enough to go home today.  Today is day #5 of antibiotics and he does not need any more antibiotics.  He will be discharged on prednisone 40 mg daily for 7 days.  Outpatient follow-up with PCP and pulmonary.  Discharge Diagnoses:   Acute on chronic hypoxic respiratory failure Exacerbation of asthma COPD overlap syndrome Possible community-acquired pneumonia -Wears 2 L oxygen at home but required 4 to 5 L oxygen on admission.  Imaging as above.  COVID-19 negative. -Treated with Rocephin and Zithromax: Today is day #5.  No need for any further  antibiotics.  Procalcitonin less than 0.1. -Treated with nebs and Dulera.  Currently on Solu-Medrol IV every 12 hours. -Currently on 2 L oxygen via nasal cannula.   -Feels much better to go home today.  Discharge home today on oral prednisone 40 mg daily for 7 days.  Continue home inhaled regimen.  Outpatient follow-up with PCP and pulmonary.    Lactic acidosis -Resolved   Anemia of chronic disease -From chronic illnesses.  Hemoglobin stable.   Diabetes mellitus type 2 with hyperglycemia -Current modified diet.  Resume home regimen.  Outpatient follow-up.   BPH -Continue terazosin   Hypertension -Monitor blood pressure.  Continue amlodipine   Physical deconditioning -PT eval recommended no PT follow-up.    Discharge Instructions  Discharge Instructions     Ambulatory referral to Pulmonology   Complete by: As directed    Reason for referral: Asthma/COPD   Diet - low sodium heart healthy   Complete by: As directed    Increase activity slowly   Complete by: As directed       Allergies as of 09/14/2021   No Known Allergies      Medication List     STOP taking these medications    chlorpheniramine 4 MG tablet Commonly known as: CHLOR-TRIMETON   doxycycline 100 MG tablet Commonly known as: VIBRA-TABS   traMADol 50 MG tablet Commonly known as: ULTRAM       TAKE these medications    albuterol 108 (90 Base) MCG/ACT inhaler Commonly known as: VENTOLIN HFA Inhale 2 puffs into the lungs every 6 (six) hours as needed for wheezing or shortness of breath.  amLODipine 5 MG tablet Commonly known as: NORVASC Take 1 tablet (5 mg total) by mouth daily. What changed: when to take this   aspirin EC 81 MG tablet Take 81 mg by mouth every morning.   COQ10 PO Take 1 capsule by mouth every morning.   Flaxseed Oil 1200 MG Caps Take 1 capsule by mouth at bedtime.   furosemide 20 MG tablet Commonly known as: LASIX Take 1 tablet (20 mg total) by mouth daily as  needed for edema.   Ginkgo Biloba Extr Take 1 tablet by mouth 2 (two) times daily.   guaiFENesin 600 MG 12 hr tablet Commonly known as: Mucinex Take 2 tablets (1,200 mg total) by mouth 2 (two) times daily.   HIMALAYAN GOJI PO Take 6 Doses by mouth daily. Goji berries   ketoconazole 2 % shampoo Commonly known as: NIZORAL APPLY   TOPICALLY TO AFFECTED AREA TWICE A WEEK What changed:  how much to take how to take this when to take this additional instructions   loratadine 10 MG tablet Commonly known as: CLARITIN TAKE 1 TABLET EVERY DAY What changed: when to take this   meloxicam 15 MG tablet Commonly known as: MOBIC Take 1 tab everyday as needed for joint pain. What changed:  how much to take how to take this when to take this additional instructions   metFORMIN 500 MG tablet Commonly known as: GLUCOPHAGE Take 0.5 tablets (250 mg total) by mouth daily.   multivitamin with minerals Tabs tablet Take 1 tablet by mouth every morning.   omeprazole 20 MG capsule Commonly known as: PRILOSEC Take 1 capsule (20 mg total) by mouth 2 (two) times daily.   OXYGEN Inhale 2 L into the lungs continuous.   pravastatin 40 MG tablet Commonly known as: PRAVACHOL Take 0.5 tablets (20 mg total) by mouth daily.   predniSONE 20 MG tablet Commonly known as: DELTASONE Take 2 tablets (40 mg total) by mouth daily for 7 days. Take '40mg'$  for 5 days What changed:  how much to take how to take this when to take this   primidone 50 MG tablet Commonly known as: MYSOLINE Take 1 tablet (50 mg total) by mouth 2 (two) times daily.   sildenafil 50 MG tablet Commonly known as: VIAGRA TAKE 1 TABLET BY MOUTH DAILY AS NEEDED FOR ERECTILE DYSFUNCTION. What changed: See the new instructions.   SUPER B COMPLEX PO Take 1 tablet by mouth at bedtime.   terazosin 1 MG capsule Commonly known as: HYTRIN Take 1 capsule (1 mg total) by mouth 2 (two) times daily.   Trelegy Ellipta 100-62.5-25  MCG/ACT Aepb Generic drug: Fluticasone-Umeclidin-Vilant Inhale 1 puff into the lungs at bedtime.   triamcinolone cream 0.1 % Commonly known as: KENALOG Apply 1 Application topically 2 (two) times daily as needed (rash/itching).   VISINE OP Place 1 drop into both eyes 2 (two) times daily.   VITAMIN D3 GUMMIES PO Take 2 tablets by mouth every morning.               Durable Medical Equipment  (From admission, onward)           Start     Ordered   09/13/21 1308  For home use only DME oxygen  Once       Question Answer Comment  Length of Need 6 Months   Mode or (Route) Nasal cannula   Liters per Minute 2   Frequency Continuous (stationary and portable oxygen unit needed)   Oxygen conserving  device Yes   Oxygen delivery system Gas      09/13/21 1307            Follow-up Information     Copland, Gay Filler, MD. Schedule an appointment as soon as possible for a visit in 1 week(s).   Specialty: Family Medicine Contact information: [address] [place] [address] [phone]         O'Neal, [facility] Joseph Thomas, MD .   Specialties: Cardiology, Internal Medicine, Radiology Contact information: [address] [place] [address] [phone]                No Known Allergies  Consultations: None   Procedures/Studies: CT Angio Chest PE W and/or Wo Contrast  Result Date: 09/11/2021 CLINICAL DATA:  Shortness of breath and cough EXAM: CT ANGIOGRAPHY CHEST WITH CONTRAST TECHNIQUE: Multidetector CT imaging of the chest was performed using the standard protocol during bolus administration of intravenous contrast. Multiplanar CT image reconstructions and MIPs were obtained to evaluate the vascular anatomy. RADIATION DOSE REDUCTION: This exam was performed according to the departmental dose-optimization program which includes automated exposure control, adjustment of the mA and/or kV according to patient size and/or use of iterative  reconstruction technique. CONTRAST:  41m OMNIPAQUE IOHEXOL 350 MG/ML SOLN COMPARISON:  None Available. FINDINGS: Cardiovascular: Contrast injection is sufficient to demonstrate satisfactory opacification of the pulmonary arteries to the segmental level. There is no pulmonary embolus or evidence of right heart strain. The size of the main pulmonary artery is normal. Heart size is normal, with no pericardial effusion. The course and caliber of the aorta are normal. There is aortic atherosclerosis. Mediastinum/Nodes: No mediastinal, hilar or axillary lymphadenopathy. Normal visualized thyroid. Thoracic esophageal course is normal. Lungs/Pleura: Emphysema. Reticular opacities in the mid lungs have progressed since 07/11/2021. Upper Abdomen: Contrast bolus timing is not optimized for evaluation of the abdominal organs. The visualized portions of the organs of the upper abdomen are normal. Musculoskeletal: No chest wall abnormality. No bony spinal canal stenosis. Review of the MIP images confirms the above findings. IMPRESSION: 1. No pulmonary embolus or acute aortic syndrome. 2. Reticular opacities in the mid lungs have progressed since 07/11/2021. 3. Aortic Atherosclerosis (ICD10-I70.0) and Emphysema (ICD10-J43.9). Electronically Signed   By: KUlyses JarredM.D.   On: 09/11/2021 00:47   DG Chest 2 View  Result Date: 09/10/2021 CLINICAL DATA:  Shortness of breath, fatigue, cough EXAM: CHEST - 2 VIEW COMPARISON:  08/21/2021.  CT 07/11/2021 FINDINGS: Increased markings and airspace opacity again noted in the right mid and lower lung, similar to prior study. Is likely reflects pneumonia superimposed on chronic lung disease. Heart is normal size. No effusions or acute bony abnormality. IMPRESSION: Continued right lower lobe airspace opacity concerning for pneumonia superimposed on chronic lung disease. Electronically Signed   By: KRolm BaptiseM.D.   On: 09/10/2021 21:31   DG Chest 2 View  Result Date:  08/21/2021 CLINICAL DATA:  Cough and congestion for the past week. EXAM: CHEST - 2 VIEW COMPARISON:  CT chest dated July 11, 2021. Chest x-ray dated March 05, 2021. FINDINGS: The heart size and mediastinal contours are within normal limits. The lungs remain hyperinflated with emphysematous changes. New patchy consolidation in the right lower lobe. No pleural effusion or pneumothorax. No acute osseous abnormality. IMPRESSION: 1. Right lower lobe pneumonia. 2. COPD. Electronically Signed   By: WTitus DubinM.D.   On: 08/21/2021 11:42      Subjective: Patient seen and examined  at bedside.  Feels better and feels okay to go home today.  Slept better last night.  Still has cough but improving.  No overnight fever or vomiting reported.  Discharge Exam: Vitals:   09/14/21 0659 09/14/21 0852  BP: (!) 146/88   Pulse: 84   Resp: 20   Temp: 97.8 F (36.6 C)   SpO2: 96% 99%    General: Pt is alert, awake, not in acute distress.  Currently on 2 L oxygen via nasal cannula.  Elderly male lying in bed. Cardiovascular: rate controlled, S1/S2 + Respiratory: bilateral decreased breath sounds at bases with some scattered crackles Abdominal: Soft, NT, ND, bowel sounds + Extremities: Trace lower extremity edema; no cyanosis    The results of significant diagnostics from this hospitalization (including imaging, microbiology, ancillary and laboratory) are listed below for reference.     Microbiology: Recent Results (from the past 240 hour(s))  SARS Coronavirus 2 by RT PCR (hospital order, performed in [facility] hospital lab) *cepheid single result test* Anterior Nasal Swab     Status: None   Collection Time: 09/10/21  8:43 PM   Specimen: Anterior Nasal Swab  Result Value Ref Range Status   SARS Coronavirus 2 by RT PCR NEGATIVE NEGATIVE Final    Comment: (NOTE) SARS-CoV-2 target nucleic acids are NOT DETECTED.  The SARS-CoV-2 RNA is generally detectable in upper and lower respiratory specimens  during the acute phase of infection. The lowest concentration of SARS-CoV-2 viral copies this assay can detect is 250 copies / mL. A negative result does not preclude SARS-CoV-2 infection and should not be used as the sole basis for treatment or other patient management decisions.  A negative result may occur with improper specimen collection / handling, submission of specimen other than nasopharyngeal swab, presence of viral mutation(s) within the areas targeted by this assay, and inadequate number of viral copies (<250 copies / mL). A negative result must be combined with clinical observations, patient history, and epidemiological information.  Fact Sheet for Patients:   https://www.patel.info/  Fact Sheet for Healthcare Providers: https://hall.com/  This test is not yet approved or  cleared by the [place] FDA and has been authorized for detection and/or diagnosis of SARS-CoV-2 by FDA under an Emergency Use Authorization (EUA).  This EUA will remain in effect (meaning this test can be used) for the duration of the COVID-19 declaration under Section 564(b)(1) of the Act, 21 U.S.C. section 360bbb-3(b)(1), unless the authorization is terminated or revoked sooner.  Performed at [facility], [place] [address] [place], [place] [address]   Blood Culture (routine x 2)     Status: None (Preliminary result)   Collection Time: 09/10/21  9:31 PM   Specimen: BLOOD  Result Value Ref Range Status   Specimen Description   Final    BLOOD RIGHT ANTECUBITAL Performed at [facility] [address], [place], [place] [address]    Special Requests   Final    BOTTLES DRAWN AEROBIC AND ANAEROBIC Blood Culture adequate volume Performed at [facility] [address], [place], [place] [address]    Culture   Final    NO GROWTH 4 DAYS Performed at [facility], [place] [address]  [place], [place] [address]    Report Status PENDING  Incomplete  Urine Culture     Status: Abnormal   Collection Time: 09/10/21  9:31 PM   Specimen: In/Out Cath Urine  Result Value Ref Range Status   Specimen  Description   Final    IN/OUT CATH URINE Performed at [facility] [address] [place], [place] [address]    Special Requests   Final    NONE Performed at [facility], [place] [address] [place], [place] [address]    Culture 3,000 COLONIES/mL STAPHYLOCOCCUS EPIDERMIDIS (A)  Final   Report Status 09/13/2021 FINAL  Final   Organism ID, Bacteria STAPHYLOCOCCUS EPIDERMIDIS (A)  Final      Susceptibility   Staphylococcus epidermidis - MIC*    CIPROFLOXACIN <=0.5 SENSITIVE Sensitive     GENTAMICIN <=0.5 SENSITIVE Sensitive     NITROFURANTOIN <=16 SENSITIVE Sensitive     OXACILLIN >=4 RESISTANT Resistant     TETRACYCLINE >=16 RESISTANT Resistant     VANCOMYCIN 1 SENSITIVE Sensitive     TRIMETH/SULFA 80 RESISTANT Resistant     CLINDAMYCIN >=8 RESISTANT Resistant     RIFAMPIN <=0.5 SENSITIVE Sensitive     Inducible Clindamycin NEGATIVE Sensitive     * 3,000 COLONIES/mL STAPHYLOCOCCUS EPIDERMIDIS  Blood Culture (routine x 2)     Status: None (Preliminary result)   Collection Time: 09/10/21 10:45 PM   Specimen: BLOOD  Result Value Ref Range Status   Specimen Description   Final    BLOOD BLOOD RIGHT FOREARM Performed at [facility] [address], [place], [place] [address]    Special Requests   Final    BOTTLES DRAWN AEROBIC AND ANAEROBIC Blood Culture results may not be optimal due to an excessive volume of blood received in culture bottles Performed at [facility] [address], [place], [place] [address]    Culture   Final    NO GROWTH 3 DAYS Performed at [facility], [place] [address], [place], [place] [address]    Report Status PENDING  Incomplete     Labs: BNP (last 3 results) No  results for input(s): "BNP" in the last 8760 hours. Basic Metabolic Panel: Recent Labs  Lab 09/10/21 2131 09/12/21 0644  NA 136 140  K 4.4 3.9  CL 101 106  CO2 24 26  GLUCOSE 203* 132*  BUN 18 19  CREATININE 1.08 0.95  CALCIUM 9.2 9.0   Liver Function Tests: Recent Labs  Lab 09/10/21 2131  AST 17  ALT 17  ALKPHOS 67  BILITOT 0.6  PROT 7.8  ALBUMIN 3.6   No results for input(s): "LIPASE", "AMYLASE" in the last 168 hours. No results for input(s): "AMMONIA" in the last 168 hours. CBC: Recent Labs  Lab 09/10/21 2131 09/12/21 0644  WBC 7.2 7.4  NEUTROABS 5.2  --   HGB 12.7* 12.6*  HCT 38.3* 37.5*  MCV 97.5 96.9  PLT 193 176   Cardiac Enzymes: No results for input(s): "CKTOTAL", "CKMB", "CKMBINDEX", "TROPONINI" in the last 168 hours. BNP: Invalid input(s): "POCBNP" CBG: Recent Labs  Lab 09/13/21 0808 09/13/21 1140 09/13/21 1618 09/13/21 2027 09/14/21 0834  GLUCAP 122* 122* 205* 163* 173*   D-Dimer No results for input(s): "DDIMER" in the last 72 hours. Hgb A1c No results for input(s): "HGBA1C" in the last 72 hours. Lipid Profile No results for input(s): "CHOL", "HDL", "LDLCALC", "TRIG", "CHOLHDL", "LDLDIRECT" in the last 72 hours. Thyroid function studies No results for input(s): "TSH", "T4TOTAL", "T3FREE", "THYROIDAB" in the last 72 hours.  Invalid input(s): "FREET3" Anemia work up No results for input(s): "VITAMINB12", "FOLATE", "FERRITIN", "TIBC", "IRON", "RETICCTPCT" in the last 72 hours. Urinalysis    Component Value Date/Time   COLORURINE  YELLOW 09/10/2021 2131   APPEARANCEUR CLEAR 09/10/2021 2131   LABSPEC 1.016 09/10/2021 2131   PHURINE 5.0 09/10/2021 2131   GLUCOSEU NEGATIVE 09/10/2021 2131   HGBUR NEGATIVE 09/10/2021 2131   BILIRUBINUR NEGATIVE 09/10/2021 2131   KETONESUR NEGATIVE 09/10/2021 2131   PROTEINUR NEGATIVE 09/10/2021 2131   UROBILINOGEN 1.0 04/21/2014 1425   NITRITE NEGATIVE 09/10/2021 2131   LEUKOCYTESUR NEGATIVE  09/10/2021 2131   Sepsis Labs Recent Labs  Lab 09/10/21 2131 09/12/21 0644  WBC 7.2 7.4   Microbiology Recent Results (from the past 240 hour(s))  SARS Coronavirus 2 by RT PCR (hospital order, performed in [facility] hospital lab) *cepheid single result test* Anterior Nasal Swab     Status: None   Collection Time: 09/10/21  8:43 PM   Specimen: Anterior Nasal Swab  Result Value Ref Range Status   SARS Coronavirus 2 by RT PCR NEGATIVE NEGATIVE Final    Comment: (NOTE) SARS-CoV-2 target nucleic acids are NOT DETECTED.  The SARS-CoV-2 RNA is generally detectable in upper and lower respiratory specimens during the acute phase of infection. The lowest concentration of SARS-CoV-2 viral copies this assay can detect is 250 copies / mL. A negative result does not preclude SARS-CoV-2 infection and should not be used as the sole basis for treatment or other patient management decisions.  A negative result may occur with improper specimen collection / handling, submission of specimen other than nasopharyngeal swab, presence of viral mutation(s) within the areas targeted by this assay, and inadequate number of viral copies (<250 copies / mL). A negative result must be combined with clinical observations, patient history, and epidemiological information.  Fact Sheet for Patients:   https://www.patel.info/  Fact Sheet for Healthcare Providers: https://hall.com/  This test is not yet approved or  cleared by the [place] FDA and has been authorized for detection and/or diagnosis of SARS-CoV-2 by FDA under an Emergency Use Authorization (EUA).  This EUA will remain in effect (meaning this test can be used) for the duration of the COVID-19 declaration under Section 564(b)(1) of the Act, 21 U.S.C. section 360bbb-3(b)(1), unless the authorization is terminated or revoked sooner.  Performed at [facility], [place] [address] [place], [place] [address]   Blood Culture (routine x 2)     Status: None (Preliminary result)   Collection Time: 09/10/21  9:31 PM   Specimen: BLOOD  Result Value Ref Range Status   Specimen Description   Final    BLOOD RIGHT ANTECUBITAL Performed at [facility] [address] [place], [place] [address]    Special Requests   Final    BOTTLES DRAWN AEROBIC AND ANAEROBIC Blood Culture adequate volume Performed at [facility] [address], [place], [place] [address]    Culture   Final    NO GROWTH 4 DAYS Performed at [facility], [place] [address] [place], [place] [address]    Report Status PENDING  Incomplete  Urine Culture     Status: Abnormal   Collection Time: 09/10/21  9:31 PM   Specimen: In/Out Cath Urine  Result Value Ref Range Status   Specimen Description   Final    IN/OUT CATH URINE Performed at [facility] [address] [place], [place] [address]    Special Requests   Final    NONE Performed at [facility], [place] [address] [place], [place] [address]    Culture 3,000 COLONIES/mL STAPHYLOCOCCUS EPIDERMIDIS (A)  Final  Report Status 09/13/2021 FINAL  Final   Organism ID, Bacteria STAPHYLOCOCCUS EPIDERMIDIS (A)  Final      Susceptibility   Staphylococcus epidermidis - MIC*    CIPROFLOXACIN <=0.5 SENSITIVE Sensitive     GENTAMICIN <=0.5 SENSITIVE Sensitive     NITROFURANTOIN <=16 SENSITIVE Sensitive     OXACILLIN >=4 RESISTANT Resistant     TETRACYCLINE >=16 RESISTANT Resistant     VANCOMYCIN 1 SENSITIVE Sensitive     TRIMETH/SULFA 80 RESISTANT Resistant     CLINDAMYCIN >=8 RESISTANT Resistant     RIFAMPIN <=0.5 SENSITIVE Sensitive     Inducible Clindamycin NEGATIVE Sensitive     * 3,000 COLONIES/mL STAPHYLOCOCCUS EPIDERMIDIS  Blood Culture (routine x 2)     Status: None (Preliminary result)   Collection Time: 09/10/21 10:45 PM   Specimen: BLOOD  Result Value Ref Range  Status   Specimen Description   Final    BLOOD BLOOD RIGHT FOREARM Performed at [facility] [address], [place], [place] [address]    Special Requests   Final    BOTTLES DRAWN AEROBIC AND ANAEROBIC Blood Culture results may not be optimal due to an excessive volume of blood received in culture bottles Performed at [facility] [address] [place], [place] [address]    Culture   Final    NO GROWTH 3 DAYS Performed at [facility], [place] [address] [place],  [address]    Report Status PENDING  Incomplete     Time coordinating discharge: 35 minutes  SIGNED:   Aline August, MD  [facility] 09/14/2021, 10:56 AM

## 2021-09-14 NOTE — TOC Transition Note (Signed)
Transition of Care [facility]) - CM/SW Discharge Note   Patient Details  Name: Johnathan [name] MRN: [id number] Date of Birth: [birth year]  [facility] CM/SW Contact:  Vassie Moselle, LCSW Phone Number: 09/14/2021, 10:00 AM   Clinical Narrative:    Met with pt regarding home O2. Pt states he has all of the O2 he needs at home and with him and declines to have additional O2 ordered. Pt states he has let MD know this and wants CSW to reiterate this. Pt attempted to be antagonistic throughout conversation and appeared to be attempting to provoke CSW by comments and behaviors. CSW remained pleasant and answered all questions appropriately. No further needs identified for this pt.    Final next level of care: Home/Self Care Barriers to Discharge: No Barriers Identified   Patient Goals and CMS Choice Patient states their goals for this hospitalization and ongoing recovery are:: To go home   Choice offered to / list presented to : Patient  Discharge Placement                       Discharge Plan and Services In-house Referral: NA Discharge Planning Services: NA Post Acute Care Choice: NA          DME Arranged: N/A DME Agency: NA                  Social Determinants of Health (SDOH) Interventions     Readmission Risk Interventions    09/14/2021    9:56 AM  Readmission Risk Prevention Plan  Post Dischage Appt Complete  Medication Screening Complete  Transportation Screening Complete

## 2021-09-15 LAB — CULTURE, BLOOD (ROUTINE X 2)
Culture: NO GROWTH
Special Requests: ADEQUATE

## 2021-09-16 ENCOUNTER — Other Ambulatory Visit: Payer: Self-pay | Admitting: Family Medicine

## 2021-09-16 LAB — CULTURE, BLOOD (ROUTINE X 2): Culture: NO GROWTH

## 2021-09-17 ENCOUNTER — Telehealth: Payer: Self-pay

## 2021-09-17 NOTE — Telephone Encounter (Signed)
Transition Care Management Follow-up Telephone Call Date of discharge and from where: [facility] 09-14-21 Dx: Acute on chronic hypoxic respiratory failure How have you been since you were released from the hospital? Doing ok  Any questions or concerns? No  Items Reviewed: Did the pt receive and understand the discharge instructions provided? Yes  Medications obtained and verified? Yes  Other? No  Any new allergies since your discharge? No  Dietary orders reviewed? Yes Do you have support at home? Yes   Home Care and Equipment/Supplies: Were home health services ordered? no If so, what is the name of the agency? na  Has the agency set up a time to come to the patient's home? not applicable Were any new equipment or medical supplies ordered?  No What is the name of the medical supply agency? na Were you able to get the supplies/equipment? not applicable Do you have any questions related to the use of the equipment or supplies? No  Functional Questionnaire: (I = Independent and D = Dependent) ADLs: I  Bathing/Dressing- I  Meal Prep- I  Eating- I  Maintaining continence- I  Transferring/Ambulation- I  Managing Meds- I  Follow up appointments reviewed:  PCP Hospital f/u appt confirmed? Yes  Scheduled to see Dr Lorelei Pont  on 09-27-21 @ 320pm. [facility] f/u appt confirmed? Yes  Scheduled to see Dr Belenda Cruise on 09-21-21 @ 3pm. Are transportation arrangements needed? No  If their condition worsens, is the pt aware to call PCP or go to the Emergency Dept.? Yes Was the patient provided with contact information for the PCP's office or ED? Yes Was to pt encouraged to call back with questions or concerns? Yes

## 2021-09-18 ENCOUNTER — Other Ambulatory Visit: Payer: Self-pay | Admitting: Family Medicine

## 2021-09-19 ENCOUNTER — Telehealth: Payer: Self-pay | Admitting: Family Medicine

## 2021-09-19 ENCOUNTER — Other Ambulatory Visit: Payer: Self-pay

## 2021-09-19 MED ORDER — SILDENAFIL CITRATE 50 MG PO TABS: 50.0000 mg | ORAL_TABLET | Freq: Every day | ORAL | 3 refills | Status: DC | PRN

## 2021-09-19 NOTE — Telephone Encounter (Signed)
Rx sent to [facility]

## 2021-09-19 NOTE — Telephone Encounter (Signed)
Patient called to have his sildenafil (VIAGRA) 50 MG tablet [[id number]]  switched to [facility] [place], [place], [place] [address]; [phone] since rx is cheaper.

## 2021-09-19 NOTE — Telephone Encounter (Signed)
Pt has called multiple times about his medication and is hoping to get it as soon as possible. He is waiting at the pharmacy.

## 2021-09-21 ENCOUNTER — Encounter: Payer: Self-pay | Admitting: Nurse Practitioner

## 2021-09-21 ENCOUNTER — Ambulatory Visit (INDEPENDENT_AMBULATORY_CARE_PROVIDER_SITE_OTHER): Payer: Medicare Other | Admitting: Nurse Practitioner

## 2021-09-21 DIAGNOSIS — U071 COVID-19: Secondary | ICD-10-CM | POA: Diagnosis not present

## 2021-09-21 DIAGNOSIS — J449 Chronic obstructive pulmonary disease, unspecified: Secondary | ICD-10-CM

## 2021-09-21 DIAGNOSIS — J189 Pneumonia, unspecified organism: Secondary | ICD-10-CM | POA: Diagnosis not present

## 2021-09-21 DIAGNOSIS — J9611 Chronic respiratory failure with hypoxia: Secondary | ICD-10-CM | POA: Diagnosis not present

## 2021-09-21 MED ORDER — TRELEGY ELLIPTA 200-62.5-25 MCG/ACT IN AEPB: 28.0000 | INHALATION_SPRAY | Freq: Every day | RESPIRATORY_TRACT | 0 refills | Status: DC

## 2021-09-21 NOTE — Patient Instructions (Addendum)
Continue Trelegy 1 puff daily. Brush tongue and rinse mouth afterwards Continue Albuterol inhaler 2 puffs or 3 mL neb every 6 hours as needed for shortness of breath or wheezing. Notify if symptoms persist despite rescue inhaler/neb use.  Continue mucinex 1200 mg Twice daily for chest congestion Continue supplemental oxygen 2 lpm for goal oxygen >88-90%  Continue loratadine 1 tab for allergies  Complete prednisone as previously prescribed   Attend CT chest 10/9   Follow up in October with Dr. [name] to discuss CT scan results. If symptoms do not improve or worsen, please contact office for sooner follow up or seek emergency care.

## 2021-09-21 NOTE — Assessment & Plan Note (Signed)
Stable without any increased O2 requirement.  He was able to be weaned to his baseline of 2 L/min.  Oxygen saturations are 95% today on 2 L/min POC.  Goal greater than 88 to 90%.

## 2021-09-21 NOTE — Progress Notes (Signed)
$'@Patient'W$  ID: Johnathan [name], male    DOB: [birth year], 88 y.o.   MRN: [id number]  Chief Complaint  Patient presents with   Hospitalization Follow-up    Pt states he was released from hospital last Friday and feels great.    Referring provider: Darreld Mclean, MD  HPI: 88 year old male, former smoker (45-pack-year history) followed for COPD with chronic bronchitis and emphysema, interstitial pulmonary disease, and chronic respiratory failure with hypoxia.  He is a patient of Dr. Matilde Bash and was last seen in office on 03/05/2021.  Past medical history significant for hypertension, Raynaud's syndrome, sinusitis, GERD, DM2.  He was recently admitted from 09/10/2021 to 09/14/2021 for acute on chronic respiratory failure related to AECOPD and possible pneumonia.  He was treated with IV antibiotics and arts.  Completed Rocephin and azithromycin during his hospitalization.  Wean to his baseline 2 L supplemental O2.  Discharged on prednisone 40 mg for 7 days.   TEST/EVENTS:  02/04/2019 echocardiogram: EF 60 to 65%.  Trivial MR.  AR mild to moderate. 04/28/2020 HRCT chest: Atherosclerosis.  Right and left pulmonary arteries are enlarged.  Centrilobular and paraseptal emphysema.  Peripheral and basilar predominant subpleural reticular densities and groundglass, asymmetric on the right with mild associated architectural distortion and calcified granuloma in posterior LUL.  Could be postinfectious/postinflammatory in etiology.  Indeterminate for UIP. 05/26/2020 PFTs: FVC 2.48 (67), FEV1 1.28 (50), ratio 51, TLC 73%, DLCO 36%.  No BD.  07/13/2021 HRCT chest: New irregular 1.8 cm nodular focus in the right upper lobe.  New patchy tree-in-bud opacity in the right upper lobe as well.  New solid 0.5 cm peripheral left upper lobe pulmonary nodule.  Mild patchy subpleural groundglass opacity and reticulation in both lungs, dependent lower predominance.  Scattered mild cylindrical bronchiectasis.  Not substantially  changed when compared to previous.  Favor bland nonspecific postinfectious/postinflammatory scarring.  Indeterminate for UIP.  Recommend follow-up in 3 months 09/11/2021 CTA chest: No evidence of PE.  Reticular opacities in the mid lungs have progressed since 07/11/2021, concern for superimposed infection.  06/14/2021: OV with Dr. [name] for follow-up.  Tried on Breztri at last visit but did not like it so is back on Trelegy.  Continues on supplemental oxygen.  PFTs show severe obstruction.  He was tried off oxygen during OV but desaturated with exertion so instructed to continue using oxygen with activity and at night.  HRCT chest ordered to evaluate ILD.  09/21/2021: Today-Hospital follow-up Patient presents today for hospital follow-up.  He was recently admitted for acute on chronic respiratory failure related to COPD exacerbation and possible pneumonia.  He improved after being treated with IV antibiotics and steroids.  He was discharged home on prednisone burst; has 1 or 2 days left of 40 mg and then will be done.  He feels significantly better.  Feels like he is back to his baseline.  He lives a relatively active lifestyle and does not feel like his breathing limits him.  He is able to take his POC with him which gives him freedom to travel as he wishes.  Cough has improved.  He occasionally produces some white sputum, which is close to his baseline.  No significant chest congestion or wheezing.  Denies any fevers or chills, hemoptysis, weight loss, anorexia.  He continues on Trelegy which she feels like works well for him.  He is wearing supplemental oxygen 2 L/min.  No Known Allergies  Immunization History  Administered Date(s) Administered   Fluad Quad(high Dose 65+)  10/18/2019   Influenza-Unspecified 10/20/2013, 10/30/2020   PFIZER(Purple Top)SARS-COV-2 Vaccination 02/05/2019, 03/08/2019, 10/22/2019   PNEUMOCOCCAL CONJUGATE-20 10/30/2020   Pfizer Covid-19 Vaccine Bivalent Booster 31yr & up  10/30/2020   Pneumococcal Conjugate-13 11/25/2019   Pneumococcal Polysaccharide-23 12/27/2007   Td 11/27/2007, 07/31/2020   Zoster Recombinat (Shingrix) 07/31/2020, 01/30/2021   Zoster, Live 12/27/2006    Past Medical History:  Diagnosis Date   Anal fissure    COPD (chronic obstructive pulmonary disease) ([facility]    Diverticulosis    DM type 2 (diabetes mellitus, type 2) (HCC)    GERD (gastroesophageal reflux disease)    uses prilosec    Hypertension    Pneumonia 2018   Raynaud's disease    Shortness of breath dyspnea    WITH EXERTION   Sinusitis    Tremors of nervous system     Tobacco History: Social History   Tobacco Use  Smoking Status Former   Packs/day: 1.00   Years: 45.00   Total pack years: 45.00   Types: Cigarettes   Quit date: 01/21/1994   Years since quitting: 27.6  Smokeless Tobacco Never   Counseling given: Not Answered   Outpatient Medications Prior to Visit  Medication Sig Dispense Refill   albuterol (VENTOLIN HFA) 108 (90 Base) MCG/ACT inhaler Inhale 2 puffs into the lungs every 6 (six) hours as needed for wheezing or shortness of breath. 18 g 2   amLODipine (NORVASC) 5 MG tablet Take 1 tablet (5 mg total) by mouth daily. (Patient taking differently: Take 5 mg by mouth every morning.) 90 tablet 3   aspirin EC 81 MG tablet Take 81 mg by mouth every morning.     B Complex-C (SUPER B COMPLEX PO) Take 1 tablet by mouth at bedtime.     Cholecalciferol (VITAMIN D3 GUMMIES PO) Take 2 tablets by mouth every morning.     Coenzyme Q10 (COQ10 PO) Take 1 capsule by mouth every morning.     Flaxseed, Linseed, (FLAXSEED OIL) 1200 MG CAPS Take 1 capsule by mouth at bedtime.     Fluticasone-Umeclidin-Vilant (TRELEGY ELLIPTA) 100-62.5-25 MCG/ACT AEPB Inhale 1 puff into the lungs at bedtime.     furosemide (LASIX) 20 MG tablet Take 1 tablet (20 mg total) by mouth daily as needed for edema. 30 tablet 0   Ginkgo Biloba EXTR Take 1 tablet by mouth 2 (two) times daily.      guaiFENesin (MUCINEX) 600 MG 12 hr tablet Take 2 tablets (1,200 mg total) by mouth 2 (two) times daily. 60 tablet 0   ketoconazole (NIZORAL) 2 % shampoo APPLY   TOPICALLY TO AFFECTED AREA TWICE A WEEK (Patient taking differently: Apply 1 Application topically 2 (two) times a week.) 120 mL 0   loratadine (CLARITIN) 10 MG tablet TAKE 1 TABLET EVERY DAY (Patient taking differently: Take 10 mg by mouth every morning.) 90 tablet 1   meloxicam (MOBIC) 15 MG tablet TAKE 1 TABLET BY MOUTH DAILY AS  NEEDED FOR JOINT PAIN 90 tablet 3   metFORMIN (GLUCOPHAGE) 500 MG tablet Take 0.5 tablets (250 mg total) by mouth daily. 90 tablet 3   Misc Natural Products (HIMALAYAN GOJI PO) Take 6 Doses by mouth daily. Goji berries     Multiple Vitamin (MULTIVITAMIN WITH MINERALS) TABS tablet Take 1 tablet by mouth every morning.     omeprazole (PRILOSEC) 20 MG capsule Take 1 capsule (20 mg total) by mouth 2 (two) times daily. 180 capsule 3   OXYGEN Inhale 2 L into the lungs continuous.  pravastatin (PRAVACHOL) 40 MG tablet Take 0.5 tablets (20 mg total) by mouth daily. 45 tablet 3   predniSONE (DELTASONE) 20 MG tablet Take 2 tablets (40 mg total) by mouth daily for 7 days. Take '40mg'$  for 5 days 14 tablet 0   primidone (MYSOLINE) 50 MG tablet Take 1 tablet (50 mg total) by mouth 2 (two) times daily. 180 tablet 3   sildenafil (VIAGRA) 50 MG tablet Take 1 tablet (50 mg total) by mouth daily as needed for erectile dysfunction. 10 tablet 3   terazosin (HYTRIN) 1 MG capsule Take 1 capsule (1 mg total) by mouth 2 (two) times daily. 180 capsule 3   Tetrahydrozoline HCl (VISINE OP) Place 1 drop into both eyes 2 (two) times daily.     triamcinolone cream (KENALOG) 0.1 % Apply 1 Application topically 2 (two) times daily as needed (rash/itching).     No facility-administered medications prior to visit.     Review of Systems:   Constitutional: No weight loss or gain, night sweats, fevers, chills, fatigue, or lassitude. HEENT: No  headaches, difficulty swallowing, tooth/dental problems, or sore throat. No sneezing, itching, ear ache, nasal congestion, or post nasal drip CV:  No chest pain, orthopnea, PND, swelling in lower extremities, anasarca, dizziness, palpitations, syncope Resp: +shortness of breath with strenuous activity (baseline). No excess mucus or change in color of mucus. No productive or non-productive. No hemoptysis. No wheezing.  No chest wall deformity GI:  No heartburn, indigestion, abdominal pain, nausea, vomiting, diarrhea, change in bowel habits, loss of appetite, bloody stools.  GU: No dysuria, change in color of urine, urgency or frequency.  No flank pain, no hematuria  Skin: No rash, lesions, ulcerations MSK:  No joint pain or swelling.  No decreased range of motion.  No back pain. Neuro: No dizziness or lightheadedness.  Psych: No depression or anxiety. Mood stable.     Physical Exam:  BP 116/66 (BP Location: Right Arm, Patient Position: Sitting, Cuff Size: Normal)   Pulse 83   Temp 98.1 F (36.7 C) (Oral)   Ht '5\' 9"'$  (1.753 m)   Wt 150 lb 3.2 oz (68.1 kg)   SpO2 95%   BMI 22.18 kg/m   GEN: Pleasant, interactive, well-nourished, elderly; in no acute distress. HEENT:  Normocephalic and atraumatic. EACs patent bilaterally. TM pearly gray with present light reflex bilaterally. PERRLA. Sclera white. Nasal turbinates pink, moist and patent bilaterally. No rhinorrhea present. Oropharynx pink and moist, without exudate or edema. No lesions, ulcerations, or postnasal drip.  NECK:  Supple w/ fair ROM. No JVD present. Normal carotid impulses w/o bruits. Thyroid symmetrical with no goiter or nodules palpated. No lymphadenopathy.   CV: RRR, no m/r/g, no peripheral edema. Pulses intact, +2 bilaterally. No cyanosis, pallor or clubbing. PULMONARY:  Unlabored, regular breathing.  Diminished bases bilaterally A&P w/o wheezes/rales/rhonchi. No accessory muscle use. No dullness to percussion. GI: BS present  and normoactive. Soft, non-tender to palpation. No organomegaly or masses detected. No CVA tenderness. MSK: No erythema, warmth or tenderness. Cap refil <2 sec all extrem. No deformities or joint swelling noted.  Neuro: A/Ox3. No focal deficits noted.   Skin: Warm, no lesions or rashe Psych: Normal affect and behavior. Judgement and thought content appropriate.     Lab Results:  CBC    Component Value Date/Time   WBC 7.4 09/12/2021 0644   RBC 3.87 (L) 09/12/2021 0644   HGB 12.6 (L) 09/12/2021 0644   HCT 37.5 (L) 09/12/2021 0644   PLT 176 09/12/2021  0644   MCV 96.9 09/12/2021 0644   MCH 32.6 09/12/2021 0644   MCHC 33.6 09/12/2021 0644   RDW 13.2 09/12/2021 0644   LYMPHSABS 1.0 09/10/2021 2131   MONOABS 0.7 09/10/2021 2131   EOSABS 0.1 09/10/2021 2131   BASOSABS 0.0 09/10/2021 2131    BMET    Component Value Date/Time   NA 140 09/12/2021 0644   NA 143 01/27/2019 1421   K 3.9 09/12/2021 0644   CL 106 09/12/2021 0644   CO2 26 09/12/2021 0644   GLUCOSE 132 (H) 09/12/2021 0644   BUN 19 09/12/2021 0644   BUN 15 01/27/2019 1421   CREATININE 0.95 09/12/2021 0644   CREATININE 1.06 10/18/2019 1412   CALCIUM 9.0 09/12/2021 0644   GFRNONAA >60 09/12/2021 0644   GFRAA >60 05/06/2019 1637    BNP    Component Value Date/Time   BNP 75.9 02/20/2020 1243     Imaging:  CT Angio Chest PE W and/or Wo Contrast  Result Date: 09/11/2021 CLINICAL DATA:  Shortness of breath and cough EXAM: CT ANGIOGRAPHY CHEST WITH CONTRAST TECHNIQUE: Multidetector CT imaging of the chest was performed using the standard protocol during bolus administration of intravenous contrast. Multiplanar CT image reconstructions and MIPs were obtained to evaluate the vascular anatomy. RADIATION DOSE REDUCTION: This exam was performed according to the departmental dose-optimization program which includes automated exposure control, adjustment of the mA and/or kV according to patient size and/or use of iterative  reconstruction technique. CONTRAST:  74m OMNIPAQUE IOHEXOL 350 MG/ML SOLN COMPARISON:  None Available. FINDINGS: Cardiovascular: Contrast injection is sufficient to demonstrate satisfactory opacification of the pulmonary arteries to the segmental level. There is no pulmonary embolus or evidence of right heart strain. The size of the main pulmonary artery is normal. Heart size is normal, with no pericardial effusion. The course and caliber of the aorta are normal. There is aortic atherosclerosis. Mediastinum/Nodes: No mediastinal, hilar or axillary lymphadenopathy. Normal visualized thyroid. Thoracic esophageal course is normal. Lungs/Pleura: Emphysema. Reticular opacities in the mid lungs have progressed since 07/11/2021. Upper Abdomen: Contrast bolus timing is not optimized for evaluation of the abdominal organs. The visualized portions of the organs of the upper abdomen are normal. Musculoskeletal: No chest wall abnormality. No bony spinal canal stenosis. Review of the MIP images confirms the above findings. IMPRESSION: 1. No pulmonary embolus or acute aortic syndrome. 2. Reticular opacities in the mid lungs have progressed since 07/11/2021. 3. Aortic Atherosclerosis (ICD10-I70.0) and Emphysema (ICD10-J43.9). Electronically Signed   By: KUlyses JarredM.D.   On: 09/11/2021 00:47   DG Chest 2 View  Result Date: 09/10/2021 CLINICAL DATA:  Shortness of breath, fatigue, cough EXAM: CHEST - 2 VIEW COMPARISON:  08/21/2021.  CT 07/11/2021 FINDINGS: Increased markings and airspace opacity again noted in the right mid and lower lung, similar to prior study. Is likely reflects pneumonia superimposed on chronic lung disease. Heart is normal size. No effusions or acute bony abnormality. IMPRESSION: Continued right lower lobe airspace opacity concerning for pneumonia superimposed on chronic lung disease. Electronically Signed   By: KRolm BaptiseM.D.   On: 09/10/2021 21:31          Latest Ref Rng & Units 05/26/2020    11:59 AM  PFT Results  FVC-Pre L 2.48   FVC-Predicted Pre % 67   FVC-Post L 2.41   FVC-Predicted Post % 66   Pre FEV1/FVC % % 52   Post FEV1/FCV % % 51   FEV1-Pre L 1.28   FEV1-Predicted Pre %  50   FEV1-Post L 1.23   DLCO uncorrected ml/min/mmHg 7.92   DLCO UNC% % 34   DLCO corrected ml/min/mmHg 8.38   DLCO COR %Predicted % 36   DLVA Predicted % 52   TLC L 5.03   TLC % Predicted % 73   RV % Predicted % 80     No results found for: "NITRICOXIDE"      Assessment & Plan:   COPD with chronic bronchitis and emphysema (HCC) Severe COPD with recent acute exacerbation.  He seems to have recovered well and is clinically improved.  He was treated for suspected pneumonia with increased reticular opacities on imaging.  We will plan to keep his follow-up CT scan for October, as previously ordered to make sure that these have improved/resolved.  Continue triple therapy with Trelegy.  Encouraged him to remain active.  Patient Instructions  Continue Trelegy 1 puff daily. Brush tongue and rinse mouth afterwards Continue Albuterol inhaler 2 puffs or 3 mL neb every 6 hours as needed for shortness of breath or wheezing. Notify if symptoms persist despite rescue inhaler/neb use.  Continue mucinex 1200 mg Twice daily for chest congestion Continue supplemental oxygen 2 lpm for goal oxygen >88-90%  Continue loratadine 1 tab for allergies  Complete prednisone as previously prescribed   Attend CT chest 10/9   Follow up in October with Dr. [name] to discuss CT scan results. If symptoms do not improve or worsen, please contact office for sooner follow up or seek emergency care.     CAP (community acquired pneumonia) Clinically improved.  Completed Rocephin and azithromycin during his hospital stay.  See above plan.  Chronic respiratory failure with hypoxia (HCC) Stable without any increased O2 requirement.  He was able to be weaned to his baseline of 2 L/min.  Oxygen saturations are 95% today  on 2 L/min POC.  Goal greater than 88 to 90%.    I spent 32 minutes of dedicated to the care of this patient on the date of this encounter to include pre-visit review of records, face-to-face time with the patient discussing conditions above, post visit ordering of testing, clinical documentation with the electronic health record, making appropriate referrals as documented, and communicating necessary findings to members of the patients care team.  Clayton Bibles, NP 09/21/2021  Pt aware and understands NP's role.

## 2021-09-21 NOTE — Assessment & Plan Note (Signed)
Clinically improved.  Completed Rocephin and azithromycin during his hospital stay.  See above plan.

## 2021-09-21 NOTE — Assessment & Plan Note (Signed)
Severe COPD with recent acute exacerbation.  He seems to have recovered well and is clinically improved.  He was treated for suspected pneumonia with increased reticular opacities on imaging.  We will plan to keep his follow-up CT scan for October, as previously ordered to make sure that these have improved/resolved.  Continue triple therapy with Trelegy.  Encouraged him to remain active.  Patient Instructions  Continue Trelegy 1 puff daily. Brush tongue and rinse mouth afterwards Continue Albuterol inhaler 2 puffs or 3 mL neb every 6 hours as needed for shortness of breath or wheezing. Notify if symptoms persist despite rescue inhaler/neb use.  Continue mucinex 1200 mg Twice daily for chest congestion Continue supplemental oxygen 2 lpm for goal oxygen >88-90%  Continue loratadine 1 tab for allergies  Complete prednisone as previously prescribed   Attend CT chest 10/9   Follow up in October with Dr. [name] to discuss CT scan results. If symptoms do not improve or worsen, please contact office for sooner follow up or seek emergency care.

## 2021-09-24 NOTE — Progress Notes (Signed)
[place] at [facility] [address], [place], [place] [address] [phone]  Date:  09/27/2021   Name:  Johnathan [name]   DOB:  Nov 10, 1932   MRN:  [id number]  PCP:  Darreld Mclean, MD    Chief Complaint: Hospitalization Follow-up (COPD exacerbation, resp failure// Discharged 8.25.23. Sees Pulm.)   History of Present Illness:  Johnathan [name] is a 88 y.o. very pleasant male patient who presents with the following:  Pt seen today for follow-up History of emphysema, diabetes, COPD, hypertension, former smoker. Foday had been doing excellently for age until he got COVID in February 2022; he subsequently developed pulmonary fibrosis and is still using oxygen  Last seen by myself 8/14 when he was at baseline- he was subsequently admitted for COPD exacerbation  Admit date: 09/10/2021 Discharge date: 09/14/2021 Recommendations for Outpatient Follow-up:  Follow up with PCP in 1 week with repeat CBC/BMP Outpatient follow-up with pulmonary/Dr. Mannam in 1 to 2 weeks Follow up in ED if symptoms worsen or new appear Home Health: No Equipment/Devices: Continue oxygen by nasal cannula at 2 L/min.  Patient  has oxygen at home. Brief/Interim Summary: 88 y.o. male with medical history significant of asthma COPD overlap syndrome, chronic hypoxic respiratory failure on 2 L O2, type 2 diabetes, GERD, hypertension, Raynaud's disease, BPH, treated for pneumonia earlier this month with doxycycline presented with worsening shortness of breath.  On presentation, he was hypoxic to 80s on 2 L home oxygen and subsequently needed 4 to 5 L oxygen.  COVID-19 test was negative.  CTA chest was negative for PE but showed reticular opacities in the mid lungs progressed since 07/11/2021.  He was started on IV Solu-Medrol and antibiotics.  During the hospitalization, his condition is improved.  His respiratory status has remained stable and he is on 2 L oxygen.  He feels better  enough to go home today.  Today is day #5 of antibiotics and he does not need any more antibiotics.  He will be discharged on prednisone 40 mg daily for 7 days.  Outpatient follow-up with PCP and pulmonary.   Discharge Diagnoses:    Acute on chronic hypoxic respiratory failure Exacerbation of asthma COPD overlap syndrome Possible community-acquired pneumonia -Wears 2 L oxygen at home but required 4 to 5 L oxygen on admission.  Imaging as above.  COVID-19 negative. -Treated with Rocephin and Zithromax: Today is day #5.  No need for any further antibiotics.  Procalcitonin less than 0.1. -Treated with nebs and Dulera.  Currently on Solu-Medrol IV every 12 hours. -Currently on 2 L oxygen via nasal cannula.   -Feels much better to go home today.  Discharge home today on oral prednisone 40 mg daily for 7 days.  Continue home inhaled regimen.  Outpatient follow-up with PCP and pulmonary.   Lactic acidosis -Resolved Anemia of chronic disease -From chronic illnesses.  Hemoglobin stable. Diabetes mellitus type 2 with hyperglycemia -Current modified diet.  Resume home regimen.  Outpatient follow-up. BPH -Continue terazosin Hypertension -Monitor blood pressure.  Continue amlodipine Physical deconditioning -PT eval recommended no PT follow-up.  Seen by pulmonology NP Belenda Cruise on 9/1-  09/21/2021: Today-Hospital follow-up Patient presents today for hospital follow-up.  He was recently admitted for acute on chronic respiratory failure related to COPD exacerbation and possible pneumonia.  He improved after being treated with IV antibiotics and steroids.  He was discharged home on prednisone burst; has 1 or 2 days left of  40 mg and then will be done.  He feels significantly better.  Feels like he is back to his baseline.  He lives a relatively active lifestyle and does not feel like his breathing limits him.  He is able to take his POC with him which gives him freedom to travel as he wishes.  Cough has  improved.  He occasionally produces some white sputum, which is close to his baseline.  No significant chest congestion or wheezing.  Denies any fevers or chills, hemoptysis, weight loss, anorexia.  He continues on Trelegy which she feels like works well for him.  He is wearing supplemental oxygen 2 L/min CAP (community acquired pneumonia) Clinically improved.  Completed Rocephin and azithromycin during his hospital stay.  See above plan. Chronic respiratory failure with hypoxia (HCC) Stable without any increased O2 requirement.  He was able to be weaned to his baseline of 2 L/min.  Oxygen saturations are 95% today on 2 L/min POC.  Goal greater than 88 to 90%.   He wears his oxygen at 2l most of the time but not always He will sometimes use 1L or remove it to do a few things around the house He is interested in getting RSV vaccination this fall -we discussed this today, available at pharmacy and likely a good idea for him  He recently had a lunch date with a new possible love interest He notes sildenafil does not work for him- it just makes him sleepy.  He alerts me he may follow-up about other options later  Has has noted some nosebleeds as of late but nothing severe, due to dryness from his oxygen nasal cannula Patient Active Problem List   Diagnosis Date Noted   GERD (gastroesophageal reflux disease) 09/11/2021   Acute on chronic respiratory failure with hypoxia ([place]) 09/10/2021   Interstitial pulmonary disease ([place]) 03/19/2021   Hypertension 03/19/2021   Oral candidiasis 03/05/2021   Chronic respiratory failure with hypoxia ([place]) 03/05/2021   Trigger finger, right ring finger 02/13/2021   COPD with chronic bronchitis and emphysema ([place]) 04/26/2020   COVID-19 02/25/2020   Shingles 11/14/2019   Raynaud's syndrome 11/14/2019   Skin cancer 11/14/2019   Left inguinal hernia s/p lap repair w mesh 05/06/2019 05/06/2019   Recurrent right inguinal hernia s/p lap repair w mesh 05/06/2019  03/01/2019   Diabetes mellitus ([place]) 03/01/2019   CAP (community acquired pneumonia) 04/21/2014   COPD with acute exacerbation ([place]) 04/21/2014    Past Medical History:  Diagnosis Date   Anal fissure    COPD (chronic obstructive pulmonary disease) ([employer])    Diverticulosis    DM type 2 (diabetes mellitus, type 2) (HCC)    GERD (gastroesophageal reflux disease)    uses prilosec    Hypertension    Pneumonia 2018   Raynaud's disease    Shortness of breath dyspnea    WITH EXERTION   Sinusitis    Tremors of nervous system     Past Surgical History:  Procedure Laterality Date   COLONOSCOPY  03/27/2005   ESOPHAGOGASTRODUODENOSCOPY (EGD) WITH PROPOFOL N/A 02/08/2021   Procedure: ESOPHAGOGASTRODUODENOSCOPY (EGD) WITH PROPOFOL;  Surgeon: Milus Banister, MD;  Location: [facility];  Service: Endoscopy;  Laterality: N/A;   HERNIA REPAIR     INGUINAL HERNIA REPAIR N/A 05/06/2019   Procedure: LAPAROSCOPIC RIGHT AND LEFT INGUINAL HERNIA(S) REPAIR;  Surgeon: Michael Boston, MD;  Location: [place];  Service: General;  Laterality: N/A;    Social History   Tobacco Use  Smoking status: Former    Packs/day: 1.00    Years: 45.00    Total pack years: 45.00    Types: Cigarettes    Quit date: 01/21/1994    Years since quitting: 27.7   Smokeless tobacco: Never  Substance Use Topics   Alcohol use: Yes    Comment: a drink daily with dinner   Drug use: No    Family History  Problem Relation Age of Onset   Dementia Brother    Diabetes Brother    Heart attack Father    Dementia Father     No Known Allergies  Medication list has been reviewed and updated.  Current Outpatient Medications on File Prior to Visit  Medication Sig Dispense Refill   albuterol (VENTOLIN HFA) 108 (90 Base) MCG/ACT inhaler Inhale 2 puffs into the lungs every 6 (six) hours as needed for wheezing or shortness of breath. 18 g 2   aspirin EC 81 MG tablet Take 81 mg by mouth every morning.     B  Complex-C (SUPER B COMPLEX PO) Take 1 tablet by mouth at bedtime.     Cholecalciferol (VITAMIN D3 GUMMIES PO) Take 2 tablets by mouth every morning.     Coenzyme Q10 (COQ10 PO) Take 1 capsule by mouth every morning.     Flaxseed, Linseed, (FLAXSEED OIL) 1200 MG CAPS Take 1 capsule by mouth at bedtime.     Fluticasone-Umeclidin-Vilant (TRELEGY ELLIPTA) 100-62.5-25 MCG/ACT AEPB Inhale 1 puff into the lungs at bedtime.     Fluticasone-Umeclidin-Vilant (TRELEGY ELLIPTA) 200-62.5-25 MCG/ACT AEPB Inhale 28 each into the lungs daily. 1 each 0   furosemide (LASIX) 20 MG tablet Take 1 tablet (20 mg total) by mouth daily as needed for edema. 30 tablet 0   Ginkgo Biloba EXTR Take 1 tablet by mouth 2 (two) times daily.     guaiFENesin (MUCINEX) 600 MG 12 hr tablet Take 2 tablets (1,200 mg total) by mouth 2 (two) times daily. 60 tablet 0   ketoconazole (NIZORAL) 2 % shampoo APPLY   TOPICALLY TO AFFECTED AREA TWICE A WEEK (Patient taking differently: Apply 1 Application topically 2 (two) times a week.) 120 mL 0   loratadine (CLARITIN) 10 MG tablet TAKE 1 TABLET EVERY DAY (Patient taking differently: Take 10 mg by mouth every morning.) 90 tablet 1   meloxicam (MOBIC) 15 MG tablet TAKE 1 TABLET BY MOUTH DAILY AS  NEEDED FOR JOINT PAIN 90 tablet 3   metFORMIN (GLUCOPHAGE) 500 MG tablet Take 0.5 tablets (250 mg total) by mouth daily. 90 tablet 3   Misc Natural Products (HIMALAYAN GOJI PO) Take 6 Doses by mouth daily. Goji berries     Multiple Vitamin (MULTIVITAMIN WITH MINERALS) TABS tablet Take 1 tablet by mouth every morning.     omeprazole (PRILOSEC) 20 MG capsule Take 1 capsule (20 mg total) by mouth 2 (two) times daily. 180 capsule 3   OXYGEN Inhale 2 L into the lungs continuous.     pravastatin (PRAVACHOL) 40 MG tablet Take 0.5 tablets (20 mg total) by mouth daily. 45 tablet 3   primidone (MYSOLINE) 50 MG tablet Take 1 tablet (50 mg total) by mouth 2 (two) times daily. 180 tablet 3   sildenafil (VIAGRA) 50 MG  tablet Take 1 tablet (50 mg total) by mouth daily as needed for erectile dysfunction. 10 tablet 3   terazosin (HYTRIN) 1 MG capsule Take 1 capsule (1 mg total) by mouth 2 (two) times daily. 180 capsule 3   Tetrahydrozoline HCl (VISINE  OP) Place 1 drop into both eyes 2 (two) times daily.     triamcinolone cream (KENALOG) 0.1 % Apply 1 Application topically 2 (two) times daily as needed (rash/itching).     amLODipine (NORVASC) 5 MG tablet Take 1 tablet (5 mg total) by mouth daily. (Patient taking differently: Take 5 mg by mouth every morning.) 90 tablet 3   No current facility-administered medications on file prior to visit.    Review of Systems:  As per HPI- otherwise negative.   Physical Examination: Vitals:   09/27/21 1524  BP: 128/78  Pulse: 76  Resp: 18  Temp: 97.8 F (36.6 C)  SpO2: 96%   Vitals:   09/27/21 1524  Weight: 157 lb (71.2 kg)  Height: '5\' 9"'$  (1.753 m)   Body mass index is 23.18 kg/m. Ideal Body Weight: Weight in (lb) to have BMI = 25: 168.9  GEN: no acute distress.  Looks well, normal weight HEENT: Atraumatic, Normocephalic.  Ears and Nose: No external deformity. CV: RRR, No M/G/R. No JVD. No thrill. No extra heart sounds. PULM: CTA B, no wheezes, crackles, rhonchi. No retractions. No resp. distress. No accessory muscle use. ABD: S, NT, ND EXTR: No c/c/e PSYCH: Normally interactive. Conversant.  Wearing oxygen today as per usual  Assessment and Plan: Lower extremity edema - Plan: furosemide (LASIX) 20 MG tablet, DISCONTINUED: furosemide (LASIX) 20 MG tablet  Need for influenza vaccination - Plan: Flu Vaccine QUAD High Dose(Fluad)  Community acquired pneumonia, unspecified laterality  COPD with acute exacerbation ([place])  Patient seen today for follow-up from recent hospitalization with COPD exacerbation.  He is now back to his baseline.  He is compliant with his oxygen as well as his inhalers. He does use furosemide 20 mg daily on an as-needed basis  to manage lower extremity edema.  He could use a refill Flu shot today Pneumonia recently treated as above Plan to follow-up in 3 to 4 months as needed  Signed Lamar Blinks, MD

## 2021-09-27 ENCOUNTER — Ambulatory Visit (INDEPENDENT_AMBULATORY_CARE_PROVIDER_SITE_OTHER): Payer: Medicare Other | Admitting: Family Medicine

## 2021-09-27 VITALS — BP 128/78 | HR 76 | Temp 97.8°F | Resp 18 | Ht 69.0 in | Wt 157.0 lb

## 2021-09-27 DIAGNOSIS — J441 Chronic obstructive pulmonary disease with (acute) exacerbation: Secondary | ICD-10-CM

## 2021-09-27 DIAGNOSIS — R600 Localized edema: Secondary | ICD-10-CM

## 2021-09-27 DIAGNOSIS — J189 Pneumonia, unspecified organism: Secondary | ICD-10-CM

## 2021-09-27 DIAGNOSIS — Z23 Encounter for immunization: Secondary | ICD-10-CM

## 2021-09-27 MED ORDER — FUROSEMIDE 20 MG PO TABS: 20.0000 mg | ORAL_TABLET | Freq: Every day | ORAL | 0 refills | Status: DC | PRN

## 2021-09-27 NOTE — Patient Instructions (Addendum)
It was good to see you today- I am glad you are feeling better!  I do suggest getting an RSV vaccine at your drug store this fall  Flu shot today   Please see me in about 4 months assuming all is well

## 2021-10-11 ENCOUNTER — Ambulatory Visit: Payer: Medicare Other | Admitting: Family Medicine

## 2021-10-18 ENCOUNTER — Other Ambulatory Visit: Payer: Self-pay

## 2021-10-18 MED ORDER — KETOCONAZOLE 2 % EX SHAM: 1.0000 | MEDICATED_SHAMPOO | CUTANEOUS | Status: DC

## 2021-10-21 DIAGNOSIS — U071 COVID-19: Secondary | ICD-10-CM | POA: Diagnosis not present

## 2021-10-24 DIAGNOSIS — D0359 Melanoma in situ of other part of trunk: Secondary | ICD-10-CM | POA: Diagnosis not present

## 2021-10-24 DIAGNOSIS — D034 Melanoma in situ of scalp and neck: Secondary | ICD-10-CM | POA: Diagnosis not present

## 2021-10-25 ENCOUNTER — Telehealth: Payer: Self-pay | Admitting: Family Medicine

## 2021-10-25 ENCOUNTER — Other Ambulatory Visit: Payer: Self-pay

## 2021-10-25 ENCOUNTER — Telehealth: Payer: Self-pay | Admitting: Pulmonary Disease

## 2021-10-25 MED ORDER — TRAMADOL HCL 50 MG PO TABS: 50.0000 mg | ORAL_TABLET | Freq: Two times a day (BID) | ORAL | 0 refills | Status: DC | PRN

## 2021-10-25 MED ORDER — KETOCONAZOLE 2 % EX SHAM: 1.0000 | MEDICATED_SHAMPOO | CUTANEOUS | Status: DC

## 2021-10-25 NOTE — Telephone Encounter (Signed)
Medication:   traMADol (ULTRAM) 50 MG tablet [[id number]]   Has the patient contacted their pharmacy? No. (If no, request that the patient contact the pharmacy for the refill.) (If yes, when and what did the pharmacy advise?)  Preferred Pharmacy (with phone number or street name):   [facility], [address], [place] [address] Phone: [phone]  Fax: [phone]   Agent: Please be advised that RX refills may take up to 3 business days. We ask that you follow-up with your pharmacy.

## 2021-10-25 NOTE — Telephone Encounter (Signed)
Medication: ketoconazole (NIZORAL) 2 % shampoo    Preferred Pharmacy (with phone number or street name):  [facility], [address], [place] [address] Phone: [phone]  Fax: [phone]    Agent: Please be advised that RX refills may take up to 3 business days. We ask that you follow-up with your pharmacy.

## 2021-10-25 NOTE — Telephone Encounter (Signed)
PCC's aware.  Per 09/21/21 Katie,NP- Assessment & Plan:    COPD with chronic bronchitis and emphysema ([place]) Severe COPD with recent acute exacerbation.  He seems to have recovered well and is clinically improved.  He was treated for suspected pneumonia with increased reticular opacities on imaging.  We will plan to keep his follow-up CT scan for October, as previously ordered to make sure that these have improved/resolved.  Continue triple therapy with Trelegy.  Encouraged him to remain active.   Patient Instructions  Continue Trelegy 1 puff daily. Brush tongue and rinse mouth afterwards Continue Albuterol inhaler 2 puffs or 3 mL neb every 6 hours as needed for shortness of breath or wheezing. Notify if symptoms persist despite rescue inhaler/neb use.  Continue mucinex 1200 mg Twice daily for chest congestion Continue supplemental oxygen 2 lpm for goal oxygen >88-90%  Continue loratadine 1 tab for allergies  Complete prednisone as previously prescribed    Attend CT chest 10/9    Follow up in October with Dr. [name] to discuss CT scan results. If symptoms do not improve or worsen, please contact office for sooner follow up or seek emergency care.

## 2021-10-25 NOTE — Telephone Encounter (Signed)
Okay for refill?  

## 2021-10-26 ENCOUNTER — Other Ambulatory Visit: Payer: Self-pay

## 2021-10-26 MED ORDER — KETOCONAZOLE 2 % EX SHAM: 1.0000 | MEDICATED_SHAMPOO | CUTANEOUS | Status: DC

## 2021-10-26 NOTE — Telephone Encounter (Signed)
Refill faxed in.

## 2021-10-29 ENCOUNTER — Ambulatory Visit
Admission: RE | Admit: 2021-10-29 | Discharge: 2021-10-29 | Disposition: A | Discharge status: Home / Self Care | Payer: Medicare Other | Source: Ambulatory Visit | Attending: Pulmonary Disease | Admitting: Pulmonary Disease

## 2021-10-29 ENCOUNTER — Other Ambulatory Visit: Payer: Medicare Other

## 2021-10-29 DIAGNOSIS — J439 Emphysema, unspecified: Secondary | ICD-10-CM | POA: Diagnosis not present

## 2021-10-29 DIAGNOSIS — R911 Solitary pulmonary nodule: Secondary | ICD-10-CM | POA: Diagnosis not present

## 2021-10-29 DIAGNOSIS — I700 Atherosclerosis of aorta: Secondary | ICD-10-CM | POA: Diagnosis not present

## 2021-11-03 NOTE — Patient Instructions (Incomplete)
It was great to see you again today, assuming all is well please see me in 3 to 6 months Recommended dose of the newest COVID booster this fall

## 2021-11-03 NOTE — Progress Notes (Unsigned)
[place] at [facility] [address] [place], [place] [address] [phone]  Date:  11/05/2021   Name:  Johnathan [name]   DOB:  April 29, 1932   MRN:  [id number]  PCP:  Darreld Mclean, MD    Chief Complaint: No chief complaint on file.   History of Present Illness:  Johnathan [name] is a 88 y.o. very pleasant male patient who presents with the following:  Patient seen today for periodic follow-up Most recent visit with myself was in September History of emphysema, diabetes, COPD, hypertension, former smoker. Johnathan [name] had been doing excellently for age until he got COVID in February 2022; he subsequently developed pulmonary fibrosis and is still using oxygen  He was admitted in August for a COPD exacerbation He sees pulmonology regularly for follow-up, most recent visit September 1 At her most recent visit he was feeling basically back to preadmission baseline Using furosemide 20 daily as needed for lower extremity edema  Lab Results  Component Value Date   HGBA1C 6.3 06/07/2021  Eye exam may be due Recommend latest COVID booster Flu shot completed last month  Can update A1c today, lipid profile  Patient Active Problem List   Diagnosis Date Noted   GERD (gastroesophageal reflux disease) 09/11/2021   Acute on chronic respiratory failure with hypoxia ([place]) 09/10/2021   Interstitial pulmonary disease ([place]) 03/19/2021   Hypertension 03/19/2021   Oral candidiasis 03/05/2021   Chronic respiratory failure with hypoxia ([place]) 03/05/2021   Trigger finger, right ring finger 02/13/2021   COPD with chronic bronchitis and emphysema ([place]) 04/26/2020   COVID-19 02/25/2020   Shingles 11/14/2019   Raynaud's syndrome 11/14/2019   Skin cancer 11/14/2019   Left inguinal hernia s/p lap repair w mesh 05/06/2019 05/06/2019   Recurrent right inguinal hernia s/p lap repair w mesh 05/06/2019 03/01/2019   Diabetes mellitus ([place]) 03/01/2019   CAP  (community acquired pneumonia) 04/21/2014   COPD with acute exacerbation ([place]) 04/21/2014    Past Medical History:  Diagnosis Date   Anal fissure    COPD (chronic obstructive pulmonary disease) ([place])    Diverticulosis    DM type 2 (diabetes mellitus, type 2) ([place])    GERD (gastroesophageal reflux disease)    uses prilosec    Hypertension    Pneumonia 2018   Raynaud's disease    Shortness of breath dyspnea    WITH EXERTION   Sinusitis    Tremors of nervous system     Past Surgical History:  Procedure Laterality Date   COLONOSCOPY  03/27/2005   ESOPHAGOGASTRODUODENOSCOPY (EGD) WITH PROPOFOL N/A 02/08/2021   Procedure: ESOPHAGOGASTRODUODENOSCOPY (EGD) WITH PROPOFOL;  Surgeon: Milus Banister, MD;  [facility];  Service: Endoscopy;  Laterality: N/A;   HERNIA REPAIR     INGUINAL HERNIA REPAIR N/A 05/06/2019   Procedure: LAPAROSCOPIC RIGHT AND LEFT INGUINAL HERNIA(S) REPAIR;  Surgeon: Michael Boston, MD;  Location: [facility];  Service: General;  Laterality: N/A;    Social History   Tobacco Use   Smoking status: Former    Packs/day: 1.00    Years: 45.00    Total pack years: 45.00    Types: Cigarettes    Quit date: 01/21/1994    Years since quitting: 27.8   Smokeless tobacco: Never  Substance Use Topics   Alcohol use: Yes    Comment: a drink daily with dinner   Drug use: No    Family History  Problem Relation  Age of Onset   Dementia Brother    Diabetes Brother    Heart attack Father    Dementia Father     No Known Allergies  Medication list has been reviewed and updated.  Current Outpatient Medications on File Prior to Visit  Medication Sig Dispense Refill   albuterol (VENTOLIN HFA) 108 (90 Base) MCG/ACT inhaler Inhale 2 puffs into the lungs every 6 (six) hours as needed for wheezing or shortness of breath. 18 g 2   amLODipine (NORVASC) 5 MG tablet Take 1 tablet (5 mg total) by mouth daily. (Patient taking differently: Take 5 mg by mouth  every morning.) 90 tablet 3   aspirin EC 81 MG tablet Take 81 mg by mouth every morning.     B Complex-C (SUPER B COMPLEX PO) Take 1 tablet by mouth at bedtime.     Cholecalciferol (VITAMIN D3 GUMMIES PO) Take 2 tablets by mouth every morning.     Coenzyme Q10 (COQ10 PO) Take 1 capsule by mouth every morning.     Flaxseed, Linseed, (FLAXSEED OIL) 1200 MG CAPS Take 1 capsule by mouth at bedtime.     Fluticasone-Umeclidin-Vilant (TRELEGY ELLIPTA) 100-62.5-25 MCG/ACT AEPB Inhale 1 puff into the lungs at bedtime.     Fluticasone-Umeclidin-Vilant (TRELEGY ELLIPTA) 200-62.5-25 MCG/ACT AEPB Inhale 28 each into the lungs daily. 1 each 0   furosemide (LASIX) 20 MG tablet Take 1 tablet (20 mg total) by mouth daily as needed for edema. 30 tablet 0   Ginkgo Biloba EXTR Take 1 tablet by mouth 2 (two) times daily.     guaiFENesin (MUCINEX) 600 MG 12 hr tablet Take 2 tablets (1,200 mg total) by mouth 2 (two) times daily. 60 tablet 0   ketoconazole (NIZORAL) 2 % shampoo Apply 1 Application topically 2 (two) times a week. 120 mL ML   loratadine (CLARITIN) 10 MG tablet TAKE 1 TABLET EVERY DAY (Patient taking differently: Take 10 mg by mouth every morning.) 90 tablet 1   meloxicam (MOBIC) 15 MG tablet TAKE 1 TABLET BY MOUTH DAILY AS  NEEDED FOR JOINT PAIN 90 tablet 3   metFORMIN (GLUCOPHAGE) 500 MG tablet Take 0.5 tablets (250 mg total) by mouth daily. 90 tablet 3   Misc Natural Products (HIMALAYAN GOJI PO) Take 6 Doses by mouth daily. Goji berries     Multiple Vitamin (MULTIVITAMIN WITH MINERALS) TABS tablet Take 1 tablet by mouth every morning.     omeprazole (PRILOSEC) 20 MG capsule Take 1 capsule (20 mg total) by mouth 2 (two) times daily. 180 capsule 3   OXYGEN Inhale 2 L into the lungs continuous.     pravastatin (PRAVACHOL) 40 MG tablet Take 0.5 tablets (20 mg total) by mouth daily. 45 tablet 3   primidone (MYSOLINE) 50 MG tablet Take 1 tablet (50 mg total) by mouth 2 (two) times daily. 180 tablet 3    sildenafil (VIAGRA) 50 MG tablet Take 1 tablet (50 mg total) by mouth daily as needed for erectile dysfunction. 10 tablet 3   terazosin (HYTRIN) 1 MG capsule Take 1 capsule (1 mg total) by mouth 2 (two) times daily. 180 capsule 3   Tetrahydrozoline HCl (VISINE OP) Place 1 drop into both eyes 2 (two) times daily.     traMADol (ULTRAM) 50 MG tablet Take 1 tablet (50 mg total) by mouth every 12 (twelve) hours as needed for moderate pain. 20 tablet 0   triamcinolone cream (KENALOG) 0.1 % Apply 1 Application topically 2 (two) times daily as needed (rash/itching).  No current facility-administered medications on file prior to visit.    Review of Systems:  As per HPI- otherwise negative.   Physical Examination: There were no vitals filed for this visit. There were no vitals filed for this visit. There is no height or weight on file to calculate BMI. Ideal Body Weight:    GEN: no acute distress. HEENT: Atraumatic, Normocephalic.  Ears and Nose: No external deformity. CV: RRR, No M/G/R. No JVD. No thrill. No extra heart sounds. PULM: CTA B, no wheezes, crackles, rhonchi. No retractions. No resp. distress. No accessory muscle use. ABD: S, NT, ND, +BS. No rebound. No HSM. EXTR: No c/c/e PSYCH: Normally interactive. Conversant.    Assessment and Plan: ***  Signed Lamar Blinks, MD

## 2021-11-05 ENCOUNTER — Ambulatory Visit (INDEPENDENT_AMBULATORY_CARE_PROVIDER_SITE_OTHER): Payer: Medicare Other | Admitting: Family Medicine

## 2021-11-05 VITALS — BP 114/64 | HR 82 | Temp 97.6°F | Resp 18 | Ht 69.0 in | Wt 154.0 lb

## 2021-11-05 DIAGNOSIS — Z13220 Encounter for screening for lipoid disorders: Secondary | ICD-10-CM

## 2021-11-05 DIAGNOSIS — J439 Emphysema, unspecified: Secondary | ICD-10-CM

## 2021-11-05 DIAGNOSIS — E119 Type 2 diabetes mellitus without complications: Secondary | ICD-10-CM

## 2021-11-05 DIAGNOSIS — J4489 Other specified chronic obstructive pulmonary disease: Secondary | ICD-10-CM

## 2021-11-05 DIAGNOSIS — N3943 Post-void dribbling: Secondary | ICD-10-CM | POA: Diagnosis not present

## 2021-11-06 ENCOUNTER — Encounter: Payer: Self-pay | Admitting: Family Medicine

## 2021-11-06 LAB — URINE CULTURE
MICRO NUMBER:: 14055319
Result:: NO GROWTH
SPECIMEN QUALITY:: ADEQUATE

## 2021-11-06 LAB — LIPID PANEL
Cholesterol: 137 mg/dL (ref 0–200)
HDL: 41 mg/dL (ref >39.00)
LDL Cholesterol: 69 mg/dL (ref 0–99)
NonHDL: 96.31
Total CHOL/HDL Ratio: 3
Triglycerides: 137 mg/dL (ref 0.0–149.0)
VLDL: 27.4 mg/dL (ref 0.0–40.0)

## 2021-11-06 LAB — HEMOGLOBIN A1C: Hgb A1c MFr Bld: 6.6 % — ABNORMAL HIGH (ref 4.6–6.5)

## 2021-11-07 ENCOUNTER — Ambulatory Visit (INDEPENDENT_AMBULATORY_CARE_PROVIDER_SITE_OTHER): Payer: Medicare Other | Admitting: Pulmonary Disease

## 2021-11-07 ENCOUNTER — Encounter: Payer: Self-pay | Admitting: Pulmonary Disease

## 2021-11-07 VITALS — BP 136/62 | HR 77 | Temp 98.0°F | Ht 69.0 in | Wt 154.0 lb

## 2021-11-07 DIAGNOSIS — J439 Emphysema, unspecified: Secondary | ICD-10-CM | POA: Diagnosis not present

## 2021-11-07 DIAGNOSIS — J189 Pneumonia, unspecified organism: Secondary | ICD-10-CM | POA: Diagnosis not present

## 2021-11-07 DIAGNOSIS — J4489 Other specified chronic obstructive pulmonary disease: Secondary | ICD-10-CM | POA: Diagnosis not present

## 2021-11-07 NOTE — Patient Instructions (Signed)
I am glad you are doing better.  The CT scan that shows improvement in pneumonia Continue the inhaler and oxygen Follow-up in 6 months.

## 2021-11-07 NOTE — Progress Notes (Signed)
Johnathan [name]    [id number]    01-01-1933  Primary Care Physician:Copland, Gay Filler, MD  Referring Physician: Darreld Mclean, [address] [place],  [place] [address]  Chief complaint: Follow-up for asthma, COPD, post COVID-63  HPI: 88 year old ex-smoker with history of emphysema [no PFTs on record], Raynaud's syndrome Maintained on Symbicort for many years  Hospitalized for COVID-19 in early February 2022 in spite of getting the booster vaccine. He was treated with IV remdesivir, steroids, discharged on supplemental oxygen Post discharge he continues to have persistent dyspnea on exertion, hypoxia whenever he stops oxygen.  Follow-up chest x-ray shows persistent lung infiltrate and he has been referred to pulmonary for further evaluation Advair changed to Trelegy in 2021.  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  No feather pillows or comforter. Smoking history: 45-pack-year smoker.  Quit in the 1990s Travel history: No significant travel history Relevant family history: Father had COPD.  He was a smoker.  Interim history: Continues on trelegy inhaler Hospitalized in august for CAP. CTA on that admission reviewed with mutlifocal infiltrates He is here for review of follow-up CT Breathing is stable Continues on supplemental oxygen  Outpatient Encounter Medications as of 11/07/2021  Medication Sig   albuterol (VENTOLIN HFA) 108 (90 Base) MCG/ACT inhaler Inhale 2 puffs into the lungs every 6 (six) hours as needed for wheezing or shortness of breath.   aspirin EC 81 MG tablet Take 81 mg by mouth every morning.   B Complex-C (SUPER B COMPLEX PO) Take 1 tablet by mouth at bedtime.   Cholecalciferol (VITAMIN D3 GUMMIES PO) Take 2 tablets by mouth every morning.   Coenzyme Q10 (COQ10 PO) Take 1 capsule by mouth every  morning.   Flaxseed, Linseed, (FLAXSEED OIL) 1200 MG CAPS Take 1 capsule by mouth at bedtime.   Fluticasone-Umeclidin-Vilant (TRELEGY ELLIPTA) 100-62.5-25 MCG/ACT AEPB Inhale 1 puff into the lungs at bedtime.   Ginkgo Biloba EXTR Take 1 tablet by mouth 2 (two) times daily.   guaiFENesin (MUCINEX) 600 MG 12 hr tablet Take 2 tablets (1,200 mg total) by mouth 2 (two) times daily. (Patient taking differently: Take 1,200 mg by mouth as needed.)   ketoconazole (NIZORAL) 2 % shampoo Apply 1 Application topically 2 (two) times a week.   loratadine (CLARITIN) 10 MG tablet TAKE 1 TABLET EVERY DAY (Patient taking differently: Take 10 mg by mouth every morning.)   meloxicam (MOBIC) 15 MG tablet TAKE 1 TABLET BY MOUTH DAILY AS  NEEDED FOR JOINT PAIN   metFORMIN (GLUCOPHAGE) 500 MG tablet Take 0.5 tablets (250 mg total) by mouth daily.   Misc Natural Products (HIMALAYAN GOJI PO) Take 6 Doses by mouth daily. Goji berries   Multiple Vitamin (MULTIVITAMIN WITH MINERALS) TABS tablet Take 1 tablet by mouth every morning.   omeprazole (PRILOSEC) 20 MG capsule Take 1 capsule (20 mg total) by mouth 2 (two) times daily.   OXYGEN Inhale 2 L into the lungs continuous.   pravastatin (PRAVACHOL) 40 MG tablet Take 0.5 tablets (20 mg total) by mouth daily.   primidone (MYSOLINE) 50 MG tablet Take 1 tablet (50 mg total) by mouth 2 (two) times daily.   sildenafil (VIAGRA) 50 MG tablet Take 1 tablet (50 mg total) by mouth daily as needed for erectile dysfunction.   terazosin (HYTRIN) 1  MG capsule Take 1 capsule (1 mg total) by mouth 2 (two) times daily.   Tetrahydrozoline HCl (VISINE OP) Place 1 drop into both eyes 2 (two) times daily.   traMADol (ULTRAM) 50 MG tablet Take 1 tablet (50 mg total) by mouth every 12 (twelve) hours as needed for moderate pain.   triamcinolone cream (KENALOG) 0.1 % Apply 1 Application topically 2 (two) times daily as needed (rash/itching).   amLODipine (NORVASC) 5 MG tablet Take 1 tablet (5 mg  total) by mouth daily. (Patient taking differently: Take 5 mg by mouth every morning.)   furosemide (LASIX) 20 MG tablet Take 1 tablet (20 mg total) by mouth daily as needed for edema. (Patient not taking: Reported on 11/07/2021)   [DISCONTINUED] Fluticasone-Umeclidin-Vilant (TRELEGY ELLIPTA) 200-62.5-25 MCG/ACT AEPB Inhale 28 each into the lungs daily.   No facility-administered encounter medications on file as of 11/07/2021.   Physical Exam: Blood pressure 136/62, pulse 77, temperature 98 F (36.7 C), temperature source Oral, height '5\' 9"'$  (1.753 m), weight 154 lb (69.9 kg), SpO2 91 %. Gen:      No acute distress HEENT:  EOMI, sclera anicteric Neck:     No masses; no thyromegaly Lungs:    Clear to auscultation bilaterally; normal respiratory effort CV:         Regular rate and rhythm; no murmurs Abd:      + bowel sounds; soft, non-tender; no palpable masses, no distension Ext:    No edema; adequate peripheral perfusion Skin:      Warm and dry; no rash Neuro: alert and oriented x 3 Psych: normal mood and affect   Data Reviewed: Imaging: Chest x-ray 02/20/2020-hyperinflation, no acute process Chest x-ray 02/25/2020-emphysema, mild left base opacity Chest x-ray 03/30/2020-bilateral infiltrates. CT high-resolution 04/28/2020- emphysema, peripheral sublenticular groundglass densities, calcified granuloma.  Indeterminate pattern CTA 09/11/2021-no PE, reticular opacities in the mid lung CT chest 10/29/2021-emphysema, improvement in multifocal pulmonary infiltrates I have reviewed the images personally.  PFTs: 05/26/2020 FVC 2.41 [6%], FEV1 1.23 [48%], F/F 51, TLC 5.03 [73%], DLCO 7.92 [34%] Severe obstruction, severe diffusion defect, mild restriction  Labs: CBC 02/29/2020-WBC 5.3, eos 0% CBC 05/30/2020-WBC 5, eos 7.6%, absolute eosinophil count 380 IgE 04/26/2020-858  ANA, CCP, rheumatoid factor 04/26/2020- Negative  Assessment:  Asthma, COPD overlap syndrome PFTs reviewed with severe  obstruction. Trelegy inhaler better than breztri He is improving after recent hospitalization for CAP CT scan reviewed with improving infiltrates  Will benefit from exercise therapy and weight loss Finished pulmonary rehab Continue supplemental oxygen for now  Raynaud's syndrome He has history of Raynaud's syndrome and is taking sildenafil ANA is negative.  Plan/Recommendations: Continue Trelegy, supplemental oxygen Follow-up in 6 months   Marshell Garfinkel MD [facility] 11/07/2021, 2:48 PM  CC: Copland, Gay Filler, MD

## 2021-11-08 ENCOUNTER — Other Ambulatory Visit: Payer: Self-pay | Admitting: Family Medicine

## 2021-11-08 MED ORDER — KETOCONAZOLE 2 % EX CREA: 1.0000 | TOPICAL_CREAM | Freq: Every day | CUTANEOUS | 1 refills | Status: DC

## 2021-11-09 ENCOUNTER — Telehealth: Payer: Self-pay

## 2021-11-09 MED ORDER — TRAMADOL HCL 50 MG PO TABS: 50.0000 mg | ORAL_TABLET | Freq: Two times a day (BID) | ORAL | 0 refills | Status: DC | PRN

## 2021-11-09 NOTE — Addendum Note (Signed)
Addended by: Lamar Blinks C on: 11/09/2021 03:29 PM   Modules accepted: Orders

## 2021-11-09 NOTE — Telephone Encounter (Signed)
Received a Refill request from [facility] for Tramadol. Last Refill was 10/25/21.

## 2021-11-21 ENCOUNTER — Telehealth: Payer: Self-pay | Admitting: Family Medicine

## 2021-11-21 DIAGNOSIS — U071 COVID-19: Secondary | ICD-10-CM | POA: Diagnosis not present

## 2021-11-21 NOTE — Telephone Encounter (Signed)
Pt called wanting advice on how to treat his cold. Pt stated that he has been managing it with mucinex and cough syrup but was wondering if there was anything else he can do to help treat it. Please advise.

## 2021-11-23 NOTE — Telephone Encounter (Signed)
Please advise 

## 2021-11-23 NOTE — Telephone Encounter (Signed)
Pt has started to feel some better. I asked him to let us know on Monday how he is feeling since he believes he is on the mend.

## 2021-11-26 ENCOUNTER — Ambulatory Visit (INDEPENDENT_AMBULATORY_CARE_PROVIDER_SITE_OTHER): Payer: Medicare Other | Admitting: Family Medicine

## 2021-11-26 ENCOUNTER — Encounter: Payer: Self-pay | Admitting: Family Medicine

## 2021-11-26 ENCOUNTER — Ambulatory Visit (HOSPITAL_BASED_OUTPATIENT_CLINIC_OR_DEPARTMENT_OTHER)
Admission: RE | Admit: 2021-11-26 | Discharge: 2021-11-26 | Disposition: A | Discharge status: Home / Self Care | Payer: Medicare Other | Source: Ambulatory Visit | Attending: Family Medicine | Admitting: Family Medicine

## 2021-11-26 VITALS — BP 128/72 | HR 85 | Temp 97.3°F | Resp 18 | Ht 69.0 in | Wt 150.6 lb

## 2021-11-26 DIAGNOSIS — J439 Emphysema, unspecified: Secondary | ICD-10-CM

## 2021-11-26 DIAGNOSIS — J4489 Other specified chronic obstructive pulmonary disease: Secondary | ICD-10-CM | POA: Diagnosis not present

## 2021-11-26 DIAGNOSIS — R059 Cough, unspecified: Secondary | ICD-10-CM | POA: Diagnosis not present

## 2021-11-26 MED ORDER — AZITHROMYCIN 250 MG PO TABS: ORAL_TABLET | ORAL | 0 refills | Status: DC

## 2021-11-26 MED ORDER — BENZONATATE 200 MG PO CAPS: 200.0000 mg | ORAL_CAPSULE | Freq: Three times a day (TID) | ORAL | 0 refills | Status: DC | PRN

## 2021-11-26 MED ORDER — PREDNISONE 10 MG PO TABS: ORAL_TABLET | ORAL | 0 refills | Status: DC

## 2021-11-26 MED ORDER — METHYLPREDNISOLONE ACETATE 80 MG/ML IJ SUSP
80.0000 mg | Freq: Once | INTRAMUSCULAR | Status: AC
  Administered 2021-11-26: 80 mg via INTRAMUSCULAR

## 2021-11-26 NOTE — Patient Instructions (Signed)

## 2021-11-26 NOTE — Progress Notes (Signed)
Subjective:   By signing my name below, I, Carylon Perches, attest that this documentation has been prepared under the direction and in the presence of Ann Held DO 11/26/2021   Patient ID: Johnathan [name], male    DOB: July 14, 1932, 88 y.o.   MRN: [id number]  Chief Complaint  Patient presents with   Cough    On set 2-3 weeks unproductive cough    Cough Associated symptoms include wheezing. Pertinent negatives include no chest pain, chills, fever, headaches, heartburn, myalgias, rash or shortness of breath. There is no history of environmental allergies.   Patient is in today for an office visit  He complains of a persistent cough that appeared about 2-3 weeks ago. He is on oxygen about 24 hrs a day. He currently uses two liters of oxygen. When he does cough, it's hard for him to stop and causes difficulty breathing. Occasionally he has some wheezing. He states that he does not cough while he is resting. He has been taking Mucinex and cough drops but does not like taking them because he does not feel like symptoms are improving. He had Tessalon Pearls in the past and does not recall it not working. He has Trelegy Elipta and Albuterol at home and seldomly uses it. He denies of any fever or sinus pain at this time. He had Covid related pneumonemia around January of last year.   He is requesting results from his urine specimen. Results were normal. He reports that he originally got the test done due to urinary symptoms.    Past Medical History:  Diagnosis Date   Anal fissure    COPD (chronic obstructive pulmonary disease) ([place])    Diverticulosis    DM type 2 (diabetes mellitus, type 2) ([facility])    GERD (gastroesophageal reflux disease)    uses prilosec    Hypertension    Pneumonia 2018   Raynaud's disease    Shortness of breath dyspnea    WITH EXERTION   Sinusitis    Tremors of nervous system     Past Surgical History:  Procedure Laterality Date   COLONOSCOPY  03/27/2005    ESOPHAGOGASTRODUODENOSCOPY (EGD) WITH PROPOFOL N/A 02/08/2021   Procedure: ESOPHAGOGASTRODUODENOSCOPY (EGD) WITH PROPOFOL;  Surgeon: Milus Banister, MD;  Location: [facility];  Service: Endoscopy;  Laterality: N/A;   HERNIA REPAIR     INGUINAL HERNIA REPAIR N/A 05/06/2019   Procedure: LAPAROSCOPIC RIGHT AND LEFT INGUINAL HERNIA(S) REPAIR;  Surgeon: Michael Boston, MD;  Location: [facility];  Service: General;  Laterality: N/A;    Family History  Problem Relation Age of Onset   Dementia Brother    Diabetes Brother    Heart attack Father    Dementia Father     Social History   Socioeconomic History   Marital status: Single    Spouse name: Not on file   Number of children: Not on file   Years of education: Not on file   Highest education level: Not on file  Occupational History   Not on file  Tobacco Use   Smoking status: Former    Packs/day: 1.00    Years: 45.00    Total pack years: 45.00    Types: Cigarettes    Quit date: 01/21/1994    Years since quitting: 27.8   Smokeless tobacco: Never  Vaping Use   Vaping Use: Not on file  Substance and Sexual Activity   Alcohol use: Yes    Comment: a drink daily  with dinner   Drug use: No   Sexual activity: Not on file  Other Topics Concern   Not on file  Social History Narrative   Not on file   Social Determinants of Health   Financial Resource Strain: Low Risk  (01/19/2021)   Overall Financial Resource Strain (CARDIA)    Difficulty of Paying Living Expenses: Not hard at all  Food Insecurity: No Food Insecurity (04/23/2021)   Hunger Vital Sign    Worried About Running Out of Food in the Last Year: Never true    Ran Out of Food in the Last Year: Never true  Transportation Needs: No Transportation Needs (04/23/2021)   PRAPARE - Hydrologist (Medical): No    Lack of Transportation (Non-Medical): No  Physical Activity: Inactive (01/19/2021)   Exercise Vital Sign    Days of Exercise  per Week: 0 days    Minutes of Exercise per Session: 0 min  Stress: No Stress Concern Present (01/19/2021)   [place]    Feeling of Stress : Not at all  Social Connections: Moderately Isolated (01/19/2021)   Social Connection and Isolation Panel [NHANES]    Frequency of Communication with Friends and Family: More than three times a week    Frequency of Social Gatherings with Friends and Family: Once a week    Attends Religious Services: 1 to 4 times per year    Active Member of [employer] or Organizations: No    Attends Archivist Meetings: Never    Marital Status: Never married  Intimate Partner Violence: Not At Risk (01/19/2021)   Humiliation, Afraid, Rape, and Kick questionnaire    Fear of Current or Ex-Partner: No    Emotionally Abused: No    Physically Abused: No    Sexually Abused: No    Outpatient Medications Prior to Visit  Medication Sig Dispense Refill   albuterol (VENTOLIN HFA) 108 (90 Base) MCG/ACT inhaler Inhale 2 puffs into the lungs every 6 (six) hours as needed for wheezing or shortness of breath. 18 g 2   aspirin EC 81 MG tablet Take 81 mg by mouth every morning.     B Complex-C (SUPER B COMPLEX PO) Take 1 tablet by mouth at bedtime.     Cholecalciferol (VITAMIN D3 GUMMIES PO) Take 2 tablets by mouth every morning.     Coenzyme Q10 (COQ10 PO) Take 1 capsule by mouth every morning.     Flaxseed, Linseed, (FLAXSEED OIL) 1200 MG CAPS Take 1 capsule by mouth at bedtime.     Fluticasone-Umeclidin-Vilant (TRELEGY ELLIPTA) 100-62.5-25 MCG/ACT AEPB Inhale 1 puff into the lungs at bedtime.     Ginkgo Biloba EXTR Take 1 tablet by mouth 2 (two) times daily.     guaiFENesin (MUCINEX) 600 MG 12 hr tablet Take 2 tablets (1,200 mg total) by mouth 2 (two) times daily. (Patient taking differently: Take 1,200 mg by mouth as needed.) 60 tablet 0   ketoconazole (NIZORAL) 2 % cream Apply 1 Application topically  daily. Use as needed for fungal infection 15 g 1   ketoconazole (NIZORAL) 2 % shampoo Apply 1 Application topically 2 (two) times a week. 120 mL ML   loratadine (CLARITIN) 10 MG tablet TAKE 1 TABLET EVERY DAY (Patient taking differently: Take 10 mg by mouth every morning.) 90 tablet 1   meloxicam (MOBIC) 15 MG tablet TAKE 1 TABLET BY MOUTH DAILY AS  NEEDED FOR JOINT PAIN 90 tablet 3  metFORMIN (GLUCOPHAGE) 500 MG tablet Take 0.5 tablets (250 mg total) by mouth daily. 90 tablet 3   Misc Natural Products (HIMALAYAN GOJI PO) Take 6 Doses by mouth daily. Goji berries     Multiple Vitamin (MULTIVITAMIN WITH MINERALS) TABS tablet Take 1 tablet by mouth every morning.     omeprazole (PRILOSEC) 20 MG capsule Take 1 capsule (20 mg total) by mouth 2 (two) times daily. 180 capsule 3   OXYGEN Inhale 2 L into the lungs continuous.     pravastatin (PRAVACHOL) 40 MG tablet Take 0.5 tablets (20 mg total) by mouth daily. 45 tablet 3   primidone (MYSOLINE) 50 MG tablet Take 1 tablet (50 mg total) by mouth 2 (two) times daily. 180 tablet 3   sildenafil (VIAGRA) 50 MG tablet Take 1 tablet (50 mg total) by mouth daily as needed for erectile dysfunction. 10 tablet 3   terazosin (HYTRIN) 1 MG capsule Take 1 capsule (1 mg total) by mouth 2 (two) times daily. 180 capsule 3   Tetrahydrozoline HCl (VISINE OP) Place 1 drop into both eyes 2 (two) times daily.     traMADol (ULTRAM) 50 MG tablet Take 1 tablet (50 mg total) by mouth every 12 (twelve) hours as needed for moderate pain. 20 tablet 0   triamcinolone cream (KENALOG) 0.1 % Apply 1 Application topically 2 (two) times daily as needed (rash/itching).     amLODipine (NORVASC) 5 MG tablet Take 1 tablet (5 mg total) by mouth daily. (Patient taking differently: Take 5 mg by mouth every morning.) 90 tablet 3   furosemide (LASIX) 20 MG tablet Take 1 tablet (20 mg total) by mouth daily as needed for edema. (Patient not taking: Reported on 11/07/2021) 30 tablet 0   No  facility-administered medications prior to visit.    No Known Allergies  Review of Systems  Constitutional:  Negative for chills, fever and malaise/fatigue.  HENT:  Negative for congestion, hearing loss and sinus pain.   Eyes:  Negative for discharge.  Respiratory:  Positive for cough and wheezing. Negative for sputum production and shortness of breath.   Cardiovascular:  Negative for chest pain, palpitations and leg swelling.  Gastrointestinal:  Negative for abdominal pain, blood in stool, constipation, diarrhea, heartburn, nausea and vomiting.  Genitourinary:  Negative for dysuria, frequency, hematuria and urgency.  Musculoskeletal:  Negative for back pain, falls and myalgias.  Skin:  Negative for rash.  Neurological:  Negative for dizziness, sensory change, loss of consciousness, weakness and headaches.  Endo/Heme/Allergies:  Negative for environmental allergies. Does not bruise/bleed easily.  Psychiatric/Behavioral:  Negative for depression and suicidal ideas. The patient is not nervous/anxious and does not have insomnia.        Objective:    Physical Exam Vitals and nursing note reviewed.  Constitutional:      General: He is not in acute distress.    Appearance: Normal appearance. He is not ill-appearing.  HENT:     Head: Normocephalic and atraumatic.     Right Ear: External ear normal.     Left Ear: External ear normal.  Eyes:     Extraocular Movements: Extraocular movements intact.     Pupils: Pupils are equal, round, and reactive to light.  Cardiovascular:     Rate and Rhythm: Normal rate and regular rhythm.     Heart sounds: Normal heart sounds. No murmur heard.    No gallop.  Pulmonary:     Effort: Pulmonary effort is normal. No respiratory distress.  Breath sounds: Wheezing present. No rales.  Chest:     Chest wall: No tenderness.  Skin:    General: Skin is warm and dry.  Neurological:     Mental Status: He is alert and oriented to person, place, and time.   Psychiatric:        Judgment: Judgment normal.     BP 128/72 (BP Location: Left Arm, Patient Position: Sitting, Cuff Size: Normal)   Pulse 85   Temp (!) 97.3 F (36.3 C) (Oral)   Resp 18   Ht '5\' 9"'$  (1.753 m)   Wt 150 lb 9.6 oz (68.3 kg)   SpO2 95%   BMI 22.24 kg/m  Wt Readings from Last 3 Encounters:  11/26/21 150 lb 9.6 oz (68.3 kg)  11/07/21 154 lb (69.9 kg)  11/05/21 154 lb (69.9 kg)    Diabetic Foot Exam - Simple   No data filed    Lab Results  Component Value Date   WBC 7.4 09/12/2021   HGB 12.6 (L) 09/12/2021   HCT 37.5 (L) 09/12/2021   PLT 176 09/12/2021   GLUCOSE 132 (H) 09/12/2021   CHOL 137 11/05/2021   TRIG 137.0 11/05/2021   HDL 41.00 11/05/2021   LDLCALC 69 11/05/2021   ALT 17 09/10/2021   AST 17 09/10/2021   NA 140 09/12/2021   K 3.9 09/12/2021   CL 106 09/12/2021   CREATININE 0.95 09/12/2021   BUN 19 09/12/2021   CO2 26 09/12/2021   TSH 2.99 03/30/2020   PSA 2.6 10/09/2018   INR 1.1 09/10/2021   HGBA1C 6.6 (H) 11/05/2021    Lab Results  Component Value Date   TSH 2.99 03/30/2020   Lab Results  Component Value Date   WBC 7.4 09/12/2021   HGB 12.6 (L) 09/12/2021   HCT 37.5 (L) 09/12/2021   MCV 96.9 09/12/2021   PLT 176 09/12/2021   Lab Results  Component Value Date   NA 140 09/12/2021   K 3.9 09/12/2021   CO2 26 09/12/2021   GLUCOSE 132 (H) 09/12/2021   BUN 19 09/12/2021   CREATININE 0.95 09/12/2021   BILITOT 0.6 09/10/2021   ALKPHOS 67 09/10/2021   AST 17 09/10/2021   ALT 17 09/10/2021   PROT 7.8 09/10/2021   ALBUMIN 3.6 09/10/2021   CALCIUM 9.0 09/12/2021   ANIONGAP 8 09/12/2021   GFR 56.39 (L) 06/07/2021   Lab Results  Component Value Date   CHOL 137 11/05/2021   Lab Results  Component Value Date   HDL 41.00 11/05/2021   Lab Results  Component Value Date   LDLCALC 69 11/05/2021   Lab Results  Component Value Date   TRIG 137.0 11/05/2021   Lab Results  Component Value Date   CHOLHDL 3 11/05/2021   Lab  Results  Component Value Date   HGBA1C 6.6 (H) 11/05/2021       Assessment & Plan:   Problem List Items Addressed This Visit       Unprioritized   COPD with chronic bronchitis and emphysema ([place]) - Primary   Relevant Medications   azithromycin (ZITHROMAX Z-PAK) 250 MG tablet   predniSONE (DELTASONE) 10 MG tablet   benzonatate (TESSALON) 200 MG capsule   Other Relevant Orders   DG Chest 2 View (Completed)   Meds ordered this encounter  Medications   azithromycin (ZITHROMAX Z-PAK) 250 MG tablet    Sig: As directed    Dispense:  6 each    Refill:  0   predniSONE (DELTASONE)  10 MG tablet    Sig: TAKE 3 TABLETS PO QD FOR 3 DAYS THEN TAKE 2 TABLETS PO QD FOR 3 DAYS THEN TAKE 1 TABLET PO QD FOR 3 DAYS THEN TAKE 1/2 TAB PO QD FOR 3 DAYS    Dispense:  20 tablet    Refill:  0   benzonatate (TESSALON) 200 MG capsule    Sig: Take 1 capsule (200 mg total) by mouth 3 (three) times daily as needed for cough.    Dispense:  20 capsule    Refill:  0   methylPREDNISolone acetate (DEPO-MEDROL) injection 80 mg    I, Ann Held, DO, personally preformed the services described in this documentation.  All medical record entries made by the scribe were at my direction and in my presence.  I have reviewed the chart and discharge instructions (if applicable) and agree that the record reflects my personal performance and is accurate and complete. 11/26/2021   I,Amber Collins,acting as a scribe for Ann Held, DO.,have documented all relevant documentation on the behalf of Ann Held, DO,as directed by  Ann Held, DO while in the presence of Ann Held, DO.    Ann Held, DO

## 2021-12-05 ENCOUNTER — Other Ambulatory Visit: Payer: Self-pay | Admitting: Family Medicine

## 2021-12-09 NOTE — Progress Notes (Unsigned)
[place] [facility] [address], [place], [place] [address] [phone]  Date:  12/10/2021   Name:  Johnathan [name]   DOB:  Oct 06, 1932   MRN:  [id number]  PCP:  Darreld Mclean, MD    Chief Complaint: No chief complaint on file.   History of Present Illness:  Johnathan [name] is a 88 y.o. very pleasant male patient who presents with the following:  Patient seen today for short-term follow-up History of emphysema, diabetes, COPD, hypertension, former smoker. Avish had been doing excellently for age until he got COVID in February 2022; he subsequently developed pulmonary fibrosis and is still using oxygen   Most recent visit with myself was in October, he was seen by my partner Dr. Etter Sjogren 11/6 with COPD exacerbation.  Treated with azithromycin and prednisone  Recommend eye exam Recommend latest COVID-19 booster  Patient Active Problem List   Diagnosis Date Noted   GERD (gastroesophageal reflux disease) 09/11/2021   Acute on chronic respiratory failure with hypoxia ([place]) 09/10/2021   Interstitial pulmonary disease ([place]) 03/19/2021   Hypertension 03/19/2021   Oral candidiasis 03/05/2021   Chronic respiratory failure with hypoxia ([facility]) 03/05/2021   Trigger finger, right ring finger 02/13/2021   COPD with chronic bronchitis and emphysema ([place]) 04/26/2020   COVID-19 02/25/2020   Shingles 11/14/2019   Raynaud's syndrome 11/14/2019   Skin cancer 11/14/2019   Left inguinal hernia s/p lap repair w mesh 05/06/2019 05/06/2019   Recurrent right inguinal hernia s/p lap repair w mesh 05/06/2019 03/01/2019   Diabetes mellitus ([place]) 03/01/2019   CAP (community acquired pneumonia) 04/21/2014   COPD with acute exacerbation ([place]) 04/21/2014    Past Medical History:  Diagnosis Date   Anal fissure    COPD (chronic obstructive pulmonary disease) ([place])    Diverticulosis    DM type 2 (diabetes mellitus, type 2) (H[facility])    GERD  (gastroesophageal reflux disease)    uses prilosec    Hypertension    Pneumonia 2018   Raynaud's disease    Shortness of breath dyspnea    WITH EXERTION   Sinusitis    Tremors of nervous system     Past Surgical History:  Procedure Laterality Date   COLONOSCOPY  03/27/2005   ESOPHAGOGASTRODUODENOSCOPY (EGD) WITH PROPOFOL N/A 02/08/2021   Procedure: ESOPHAGOGASTRODUODENOSCOPY (EGD) WITH PROPOFOL;  Surgeon: Milus Banister, MD;  Location: [facility];  Service: Endoscopy;  Laterality: N/A;   HERNIA REPAIR     INGUINAL HERNIA REPAIR N/A 05/06/2019   Procedure: LAPAROSCOPIC RIGHT AND LEFT INGUINAL HERNIA(S) REPAIR;  Surgeon: Michael Boston, MD;  Location: [facility];  Service: General;  Laterality: N/A;    Social History   Tobacco Use   Smoking status: Former    Packs/day: 1.00    Years: 45.00    Total pack years: 45.00    Types: Cigarettes    Quit date: 01/21/1994    Years since quitting: 27.9   Smokeless tobacco: Never  Substance Use Topics   Alcohol use: Yes    Comment: a drink daily with dinner   Drug use: No    Family History  Problem Relation Age of Onset   Dementia Brother    Diabetes Brother    Heart attack Father    Dementia Father     No Known Allergies  Medication list has been reviewed and updated.  Current Outpatient Medications on File Prior to Visit  Medication Sig  Dispense Refill   albuterol (VENTOLIN HFA) 108 (90 Base) MCG/ACT inhaler Inhale 2 puffs into the lungs every 6 (six) hours as needed for wheezing or shortness of breath. 18 g 2   amLODipine (NORVASC) 5 MG tablet Take 1 tablet (5 mg total) by mouth daily. (Patient taking differently: Take 5 mg by mouth every morning.) 90 tablet 3   aspirin EC 81 MG tablet Take 81 mg by mouth every morning.     azithromycin (ZITHROMAX Z-PAK) 250 MG tablet As directed 6 each 0   B Complex-C (SUPER B COMPLEX PO) Take 1 tablet by mouth at bedtime.     benzonatate (TESSALON) 200 MG capsule Take 1  capsule (200 mg total) by mouth 3 (three) times daily as needed for cough. 20 capsule 0   Cholecalciferol (VITAMIN D3 GUMMIES PO) Take 2 tablets by mouth every morning.     Coenzyme Q10 (COQ10 PO) Take 1 capsule by mouth every morning.     Flaxseed, Linseed, (FLAXSEED OIL) 1200 MG CAPS Take 1 capsule by mouth at bedtime.     Fluticasone-Umeclidin-Vilant (TRELEGY ELLIPTA) 100-62.5-25 MCG/ACT AEPB Inhale 1 puff into the lungs at bedtime.     furosemide (LASIX) 20 MG tablet Take 1 tablet (20 mg total) by mouth daily as needed for edema. (Patient not taking: Reported on 11/07/2021) 30 tablet 0   Ginkgo Biloba EXTR Take 1 tablet by mouth 2 (two) times daily.     guaiFENesin (MUCINEX) 600 MG 12 hr tablet Take 2 tablets (1,200 mg total) by mouth 2 (two) times daily. (Patient taking differently: Take 1,200 mg by mouth as needed.) 60 tablet 0   ketoconazole (NIZORAL) 2 % cream Apply 1 Application topically daily. Use as needed for fungal infection 15 g 1   ketoconazole (NIZORAL) 2 % shampoo Apply 1 Application topically 2 (two) times a week. 120 mL ML   loratadine (CLARITIN) 10 MG tablet TAKE 1 TABLET EVERY DAY (Patient taking differently: Take 10 mg by mouth every morning.) 90 tablet 1   meloxicam (MOBIC) 15 MG tablet TAKE 1 TABLET BY MOUTH DAILY AS  NEEDED FOR JOINT PAIN 90 tablet 3   metFORMIN (GLUCOPHAGE) 500 MG tablet Take 0.5 tablets (250 mg total) by mouth daily. 90 tablet 3   Misc Natural Products (HIMALAYAN GOJI PO) Take 6 Doses by mouth daily. Goji berries     Multiple Vitamin (MULTIVITAMIN WITH MINERALS) TABS tablet Take 1 tablet by mouth every morning.     omeprazole (PRILOSEC) 20 MG capsule Take 1 capsule (20 mg total) by mouth 2 (two) times daily. 180 capsule 3   OXYGEN Inhale 2 L into the lungs continuous.     pravastatin (PRAVACHOL) 40 MG tablet Take 0.5 tablets (20 mg total) by mouth daily. 45 tablet 3   predniSONE (DELTASONE) 10 MG tablet TAKE 3 TABLETS PO QD FOR 3 DAYS THEN TAKE 2 TABLETS  PO QD FOR 3 DAYS THEN TAKE 1 TABLET PO QD FOR 3 DAYS THEN TAKE 1/2 TAB PO QD FOR 3 DAYS 20 tablet 0   primidone (MYSOLINE) 50 MG tablet TAKE 1 TABLET BY MOUTH TWICE  DAILY 200 tablet 2   sildenafil (VIAGRA) 50 MG tablet Take 1 tablet (50 mg total) by mouth daily as needed for erectile dysfunction. 10 tablet 3   terazosin (HYTRIN) 1 MG capsule Take 1 capsule (1 mg total) by mouth 2 (two) times daily. 180 capsule 3   Tetrahydrozoline HCl (VISINE OP) Place 1 drop into both eyes 2 (two)  times daily.     traMADol (ULTRAM) 50 MG tablet Take 1 tablet (50 mg total) by mouth every 12 (twelve) hours as needed for moderate pain. 20 tablet 0   triamcinolone cream (KENALOG) 0.1 % Apply 1 Application topically 2 (two) times daily as needed (rash/itching).     No current facility-administered medications on file prior to visit.    Review of Systems:  ***  Physical Examination: There were no vitals filed for this visit. There were no vitals filed for this visit. There is no height or weight on file to calculate BMI. Ideal Body Weight:    ***  Assessment and Plan: ***  Signed Lamar Blinks, MD

## 2021-12-10 ENCOUNTER — Ambulatory Visit (INDEPENDENT_AMBULATORY_CARE_PROVIDER_SITE_OTHER): Payer: Medicare Other | Admitting: Family Medicine

## 2021-12-10 VITALS — BP 120/60 | HR 81 | Temp 98.0°F | Resp 18 | Ht 69.0 in | Wt 149.2 lb

## 2021-12-10 DIAGNOSIS — J441 Chronic obstructive pulmonary disease with (acute) exacerbation: Secondary | ICD-10-CM

## 2021-12-10 DIAGNOSIS — J449 Chronic obstructive pulmonary disease, unspecified: Secondary | ICD-10-CM | POA: Diagnosis not present

## 2021-12-10 NOTE — Patient Instructions (Signed)
It was good to see you again today- have a wonderful holiday season, let's recheck in February

## 2021-12-21 DIAGNOSIS — U071 COVID-19: Secondary | ICD-10-CM | POA: Diagnosis not present

## 2021-12-28 ENCOUNTER — Other Ambulatory Visit: Payer: Self-pay | Admitting: Family Medicine

## 2021-12-28 DIAGNOSIS — I10 Essential (primary) hypertension: Secondary | ICD-10-CM

## 2022-01-10 ENCOUNTER — Telehealth: Payer: Self-pay | Admitting: Family Medicine

## 2022-01-10 MED ORDER — TRAMADOL HCL 50 MG PO TABS: 50.0000 mg | ORAL_TABLET | Freq: Two times a day (BID) | ORAL | 1 refills | Status: DC | PRN

## 2022-01-10 NOTE — Telephone Encounter (Signed)
Prescription Request  01/10/2022  Is this a "Controlled Substance" medicine? No  LOV: 12/10/2021  What is the name of the medication or equipment?   traMADol (ULTRAM) 50 MG tablet [[id number]]   Have you contacted your pharmacy to request a refill? Yes   Which pharmacy would you like this sent to?   [facility], [address] [place] [address] Phone: [phone] Fax: [phone]  Patient notified that their request is being sent to the clinical staff for review and that they should receive a response within 2 business days.   Please advise at Mobile [phone] (mobile)

## 2022-01-10 NOTE — Telephone Encounter (Signed)
Okay for refill on Tramadol?

## 2022-01-15 ENCOUNTER — Telehealth: Payer: Self-pay | Admitting: Family Medicine

## 2022-01-15 ENCOUNTER — Other Ambulatory Visit: Payer: Self-pay

## 2022-01-15 MED ORDER — KETOCONAZOLE 2 % EX SHAM: 1.0000 | MEDICATED_SHAMPOO | CUTANEOUS | Status: DC

## 2022-01-15 NOTE — Telephone Encounter (Signed)
Refill has been sent in.  

## 2022-01-15 NOTE — Telephone Encounter (Signed)
Prescription Request  01/15/2022  Is this a "Controlled Substance" medicine? No  LOV: 12/10/2021  What is the name of the medication or equipment?  ketoconazole (NIZORAL) 2 % shampoo  Requesting 2 bottles, one for his house and one for his gym bag  Have you contacted your pharmacy to request a refill? No   Which pharmacy would you like this sent to?   [facility], [address] [place] [address] Phone: [phone] Fax: [phone]    Patient notified that their request is being sent to the clinical staff for review and that they should receive a response within 2 business days.   Please advise at [facility] [phone]

## 2022-01-21 DIAGNOSIS — U071 COVID-19: Secondary | ICD-10-CM | POA: Diagnosis not present

## 2022-01-22 ENCOUNTER — Other Ambulatory Visit: Payer: Self-pay | Admitting: Family Medicine

## 2022-01-23 ENCOUNTER — Other Ambulatory Visit: Payer: Self-pay | Admitting: *Deleted

## 2022-01-23 ENCOUNTER — Telehealth: Payer: Self-pay | Admitting: *Deleted

## 2022-01-23 DIAGNOSIS — N529 Male erectile dysfunction, unspecified: Secondary | ICD-10-CM

## 2022-01-23 MED ORDER — KETOCONAZOLE 2 % EX CREA: 1.0000 | TOPICAL_CREAM | Freq: Every day | CUTANEOUS | 1 refills | Status: AC

## 2022-01-23 MED ORDER — SILDENAFIL CITRATE 100 MG PO TABS: 50.0000 mg | ORAL_TABLET | Freq: Every day | ORAL | 6 refills | Status: DC | PRN

## 2022-01-23 NOTE — Telephone Encounter (Signed)
Pt was asking for a refill of his Sildenafil today but wants to know if it can be increased to '100mg'$  instead of '50mg'$ .  Please advise.

## 2022-01-24 ENCOUNTER — Ambulatory Visit: Payer: Medicare HMO

## 2022-01-24 ENCOUNTER — Ambulatory Visit (INDEPENDENT_AMBULATORY_CARE_PROVIDER_SITE_OTHER): Payer: Medicare Other | Admitting: *Deleted

## 2022-01-24 DIAGNOSIS — Z0000 Encounter for general adult medical examination without abnormal findings: Secondary | ICD-10-CM

## 2022-01-24 NOTE — Patient Instructions (Signed)
[name] , Thank you for taking time to come for your Medicare Wellness Visit. I appreciate your ongoing commitment to your health goals. Please review the following plan we discussed and let me know if I can assist you in the future.   These are the goals we discussed:  Goals      Patient Stated     Eat more vegetables & drink more goals        This is a list of the screening recommended for you and due dates:  Health Maintenance  Topic Date Due   Eye exam for diabetics  Never done   COVID-19 Vaccine (5 - 2023-24 season) 09/21/2021   Complete foot exam   02/12/2022   Hemoglobin A1C  05/07/2022   Medicare Annual Wellness Visit  01/25/2023   DTaP/Tdap/Td vaccine (3 - Tdap) 08/01/2030   Pneumonia Vaccine  Completed   Flu Shot  Completed   Zoster (Shingles) Vaccine  Completed   HPV Vaccine  Aged Out     Next appointment: Follow up in one year for your annual wellness visit.   Preventive Care 53 Years and Older, Male Preventive care refers to lifestyle choices and visits with your health care provider that can promote health and wellness. What does preventive care include? A yearly physical exam. This is also called an annual well check. Dental exams once or twice a year. Routine eye exams. Ask your health care provider how often you should have your eyes checked. Personal lifestyle choices, including: Daily care of your teeth and gums. Regular physical activity. Eating a healthy diet. Avoiding tobacco and drug use. Limiting alcohol use. Practicing safe sex. Taking low doses of aspirin every day. Taking vitamin and mineral supplements as recommended by your health care provider. What happens during an annual well check? The services and screenings done by your health care provider during your annual well check will depend on your age, overall health, lifestyle risk factors, and family history of disease. Counseling  Your health care provider may ask you questions about  your: Alcohol use. Tobacco use. Drug use. Emotional well-being. Home and relationship well-being. Sexual activity. Eating habits. History of falls. Memory and ability to understand (cognition). Work and work Statistician. Screening  You may have the following tests or measurements: Height, weight, and BMI. Blood pressure. Lipid and cholesterol levels. These may be checked every 5 years, or more frequently if you are over 29 years old. Skin check. Lung cancer screening. You may have this screening every year starting at age 87 if you have a 30-pack-year history of smoking and currently smoke or have quit within the past 15 years. Fecal occult blood test (FOBT) of the stool. You may have this test every year starting at age 87. Flexible sigmoidoscopy or colonoscopy. You may have a sigmoidoscopy every 5 years or a colonoscopy every 10 years starting at age 87. Prostate cancer screening. Recommendations will vary depending on your family history and other risks. Hepatitis C blood test. Hepatitis B blood test. Sexually transmitted disease (STD) testing. Diabetes screening. This is done by checking your blood sugar (glucose) after you have not eaten for a while (fasting). You may have this done every 1-3 years. Abdominal aortic aneurysm (AAA) screening. You may need this if you are a current or former smoker. Osteoporosis. You may be screened starting at age 87 if you are at high risk. Talk with your health care provider about your test results, treatment options, and if necessary, the need  for more tests. Vaccines  Your health care provider may recommend certain vaccines, such as: Influenza vaccine. This is recommended every year. Tetanus, diphtheria, and acellular pertussis (Tdap, Td) vaccine. You may need a Td booster every 10 years. Zoster vaccine. You may need this after age 77. Pneumococcal 13-valent conjugate (PCV13) vaccine. One dose is recommended after age 70. Pneumococcal  polysaccharide (PPSV23) vaccine. One dose is recommended after age 62. Talk to your health care provider about which screenings and vaccines you need and how often you need them. This information is not intended to replace advice given to you by your health care provider. Make sure you discuss any questions you have with your health care provider. Document Released: 02/03/2015 Document Revised: 09/27/2015 Document Reviewed: 11/08/2014 Elsevier Interactive Patient Education  2017 [place] Prevention in the Home Falls can cause injuries. They can happen to people of all ages. There are many things you can do to make your home safe and to help prevent falls. What can I do on the outside of my home? Regularly fix the edges of walkways and driveways and fix any cracks. Remove anything that might make you trip as you walk through a door, such as a raised step or threshold. Trim any bushes or trees on the path to your home. Use bright outdoor lighting. Clear any walking paths of anything that might make someone trip, such as rocks or tools. Regularly check to see if handrails are loose or broken. Make sure that both sides of any steps have handrails. Any raised decks and porches should have guardrails on the edges. Have any leaves, snow, or ice cleared regularly. Use sand or salt on walking paths during winter. Clean up any spills in your garage right away. This includes oil or grease spills. What can I do in the bathroom? Use night lights. Install grab bars by the toilet and in the tub and shower. Do not use towel bars as grab bars. Use non-skid mats or decals in the tub or shower. If you need to sit down in the shower, use a plastic, non-slip stool. Keep the floor dry. Clean up any water that spills on the floor as soon as it happens. Remove soap buildup in the tub or shower regularly. Attach bath mats securely with double-sided non-slip rug tape. Do not have throw rugs and other  things on the floor that can make you trip. What can I do in the bedroom? Use night lights. Make sure that you have a light by your bed that is easy to reach. Do not use any sheets or blankets that are too big for your bed. They should not hang down onto the floor. Have a firm chair that has side arms. You can use this for support while you get dressed. Do not have throw rugs and other things on the floor that can make you trip. What can I do in the kitchen? Clean up any spills right away. Avoid walking on wet floors. Keep items that you use a lot in easy-to-reach places. If you need to reach something above you, use a strong step stool that has a grab bar. Keep electrical cords out of the way. Do not use floor polish or wax that makes floors slippery. If you must use wax, use non-skid floor wax. Do not have throw rugs and other things on the floor that can make you trip. What can I do with my stairs? Do not leave any items on the stairs.  Make sure that there are handrails on both sides of the stairs and use them. Fix handrails that are broken or loose. Make sure that handrails are as long as the stairways. Check any carpeting to make sure that it is firmly attached to the stairs. Fix any carpet that is loose or worn. Avoid having throw rugs at the top or bottom of the stairs. If you do have throw rugs, attach them to the floor with carpet tape. Make sure that you have a light switch at the top of the stairs and the bottom of the stairs. If you do not have them, ask someone to add them for you. What else can I do to help prevent falls? Wear shoes that: Do not have high heels. Have rubber bottoms. Are comfortable and fit you well. Are closed at the toe. Do not wear sandals. If you use a stepladder: Make sure that it is fully opened. Do not climb a closed stepladder. Make sure that both sides of the stepladder are locked into place. Ask someone to hold it for you, if possible. Clearly  mark and make sure that you can see: Any grab bars or handrails. First and last steps. Where the edge of each step is. Use tools that help you move around (mobility aids) if they are needed. These include: Canes. Walkers. Scooters. Crutches. Turn on the lights when you go into a dark area. Replace any light bulbs as soon as they burn out. Set up your furniture so you have a clear path. Avoid moving your furniture around. If any of your floors are uneven, fix them. If there are any pets around you, be aware of where they are. Review your medicines with your doctor. Some medicines can make you feel dizzy. This can increase your chance of falling. Ask your doctor what other things that you can do to help prevent falls. This information is not intended to replace advice given to you by your health care provider. Make sure you discuss any questions you have with your health care provider. Document Released: 11/03/2008 Document Revised: 06/15/2015 Document Reviewed: 02/11/2014 Elsevier Interactive Patient Education  2017 [employer].

## 2022-01-24 NOTE — Progress Notes (Signed)
Subjective:   Johnathan [name] is a 89 y.o. male who presents for Medicare Annual/Subsequent preventive examination.  I connected with  Johnathan [name] on 01/24/22 by a audio enabled telemedicine application and verified that I am speaking with the correct person using two identifiers.  Patient Location: Home  Provider Location: Office/Clinic  I discussed the limitations of evaluation and management by telemedicine. The patient expressed understanding and agreed to proceed.   Review of Systems    Defer to PCP Cardiac Risk Factors include: advanced age (>41mn, >>16women);male gender;diabetes mellitus;hypertension     Objective:    There were no vitals filed for this visit. There is no height or weight on file to calculate BMI.     01/24/2022   11:01 AM 09/13/2021    4:19 PM 09/11/2021    2:27 PM 09/10/2021    8:51 PM 04/23/2021    9:37 AM 02/08/2021   10:08 AM 01/19/2021   11:53 AM  Advanced Directives  Does Patient Have a Medical Advance Directive? Yes Yes No No  Yes Yes  Type of AParamedicof ABad AxeLiving will HSea GirtLiving will     HPeakLiving will  Does patient want to make changes to medical advance directive? No - Patient declined No - Patient declined       Copy of HEast [place]in Chart? Yes - validated most recent copy scanned in chart (See row information) No - copy requested    Yes - validated most recent copy scanned in chart (See row information) Yes - validated most recent copy scanned in chart (See row information)  Would patient like information on creating a medical advance directive?  No - Patient declined No - Patient declined No - Patient declined No - Patient declined      Current Medications (verified) Outpatient Encounter Medications as of 01/24/2022  Medication Sig   albuterol (VENTOLIN HFA) 108 (90 Base) MCG/ACT inhaler Inhale 2 puffs into the lungs every 6 (six) hours as  needed for wheezing or shortness of breath.   amLODipine (NORVASC) 5 MG tablet Take 1 tablet (5 mg total) by mouth daily.   aspirin EC 81 MG tablet Take 81 mg by mouth every morning.   azithromycin (ZITHROMAX Z-PAK) 250 MG tablet As directed   B Complex-C (SUPER B COMPLEX PO) Take 1 tablet by mouth at bedtime.   benzonatate (TESSALON) 200 MG capsule Take 1 capsule (200 mg total) by mouth 3 (three) times daily as needed for cough.   Cholecalciferol (VITAMIN D3 GUMMIES PO) Take 2 tablets by mouth every morning.   Coenzyme Q10 (COQ10 PO) Take 1 capsule by mouth every morning.   Flaxseed, Linseed, (FLAXSEED OIL) 1200 MG CAPS Take 1 capsule by mouth at bedtime.   Fluticasone-Umeclidin-Vilant (TRELEGY ELLIPTA) 100-62.5-25 MCG/ACT AEPB Inhale 1 puff into the lungs at bedtime.   furosemide (LASIX) 20 MG tablet Take 1 tablet (20 mg total) by mouth daily as needed for edema. (Patient not taking: Reported on 11/07/2021)   Ginkgo Biloba EXTR Take 1 tablet by mouth 2 (two) times daily.   guaiFENesin (MUCINEX) 600 MG 12 hr tablet Take 2 tablets (1,200 mg total) by mouth 2 (two) times daily. (Patient taking differently: Take 1,200 mg by mouth as needed.)   ketoconazole (NIZORAL) 2 % cream Apply 1 Application topically daily. Use as needed for fungal infection   loratadine (CLARITIN) 10 MG tablet TAKE 1 TABLET EVERY DAY (Patient taking differently:  Take 10 mg by mouth every morning.)   meloxicam (MOBIC) 15 MG tablet TAKE 1 TABLET BY MOUTH DAILY AS  NEEDED FOR JOINT PAIN   metFORMIN (GLUCOPHAGE) 500 MG tablet Take 0.5 tablets (250 mg total) by mouth daily.   Misc Natural Products (HIMALAYAN GOJI PO) Take 6 Doses by mouth daily. Goji berries   Multiple Vitamin (MULTIVITAMIN WITH MINERALS) TABS tablet Take 1 tablet by mouth every morning.   omeprazole (PRILOSEC) 20 MG capsule Take 1 capsule (20 mg total) by mouth 2 (two) times daily.   OXYGEN Inhale 2 L into the lungs continuous.   pravastatin (PRAVACHOL) 40 MG  tablet Take 0.5 tablets (20 mg total) by mouth daily.   primidone (MYSOLINE) 50 MG tablet TAKE 1 TABLET BY MOUTH TWICE  DAILY   sildenafil (VIAGRA) 100 MG tablet Take 0.5-1 tablets (50-100 mg total) by mouth daily as needed for erectile dysfunction.   terazosin (HYTRIN) 1 MG capsule Take 1 capsule (1 mg total) by mouth 2 (two) times daily.   Tetrahydrozoline HCl (VISINE OP) Place 1 drop into both eyes 2 (two) times daily.   traMADol (ULTRAM) 50 MG tablet Take 1 tablet (50 mg total) by mouth every 12 (twelve) hours as needed for moderate pain.   triamcinolone cream (KENALOG) 0.1 % Apply 1 Application topically 2 (two) times daily as needed (rash/itching).   [DISCONTINUED] ketoconazole (NIZORAL) 2 % shampoo APPLY ONE APPLICATION TOPICALLY 2 TIMES A WEEK   [DISCONTINUED] predniSONE (DELTASONE) 10 MG tablet TAKE 3 TABLETS PO QD FOR 3 DAYS THEN TAKE 2 TABLETS PO QD FOR 3 DAYS THEN TAKE 1 TABLET PO QD FOR 3 DAYS THEN TAKE 1/2 TAB PO QD FOR 3 DAYS   No facility-administered encounter medications on file as of 01/24/2022.    Allergies (verified) Patient has no known allergies.   History: Past Medical History:  Diagnosis Date   Anal fissure    COPD (chronic obstructive pulmonary disease) ([place])    Diverticulosis    DM type 2 (diabetes mellitus, type 2) (HCC)    GERD (gastroesophageal reflux disease)    uses prilosec    Hypertension    Pneumonia 2018   Raynaud's disease    Shortness of breath dyspnea    WITH EXERTION   Sinusitis    Tremors of nervous system    Past Surgical History:  Procedure Laterality Date   COLONOSCOPY  03/27/2005   ESOPHAGOGASTRODUODENOSCOPY (EGD) WITH PROPOFOL N/A 02/08/2021   Procedure: ESOPHAGOGASTRODUODENOSCOPY (EGD) WITH PROPOFOL;  Surgeon: Milus Banister, MD;  Location: [facility];  Service: Endoscopy;  Laterality: N/A;   HERNIA REPAIR     INGUINAL HERNIA REPAIR N/A 05/06/2019   Procedure: LAPAROSCOPIC RIGHT AND LEFT INGUINAL HERNIA(S) REPAIR;  Surgeon: Michael Boston, MD;  Location: [facility];  Service: General;  Laterality: N/A;   Family History  Problem Relation Age of Onset   Dementia Brother    Diabetes Brother    Heart attack Father    Dementia Father    Social History   Socioeconomic History   Marital status: Single    Spouse name: Not on file   Number of children: Not on file   Years of education: Not on file   Highest education level: Not on file  Occupational History   Not on file  Tobacco Use   Smoking status: Former    Packs/day: 1.00    Years: 45.00    Total pack years: 45.00    Types: Cigarettes  Quit date: 01/21/1994    Years since quitting: 28.0   Smokeless tobacco: Never  Vaping Use   Vaping Use: Not on file  Substance and Sexual Activity   Alcohol use: Yes    Comment: a drink daily with dinner   Drug use: No   Sexual activity: Not on file  Other Topics Concern   Not on file  Social History Narrative   Not on file   Social Determinants of Health   Financial Resource Strain: Low Risk  (01/19/2021)   Overall Financial Resource Strain (CARDIA)    Difficulty of Paying Living Expenses: Not hard at all  Food Insecurity: No Food Insecurity (04/23/2021)   Hunger Vital Sign    Worried About Running Out of Food in the Last Year: Never true    Ran Out of Food in the Last Year: Never true  Transportation Needs: No Transportation Needs (04/23/2021)   PRAPARE - Hydrologist (Medical): No    Lack of Transportation (Non-Medical): No  Physical Activity: Inactive (01/19/2021)   Exercise Vital Sign    Days of Exercise per Week: 0 days    Minutes of Exercise per Session: 0 min  Stress: No Stress Concern Present (01/19/2021)   [place]    Feeling of Stress : Not at all  Social Connections: Moderately Isolated (01/19/2021)   Social Connection and Isolation Panel [NHANES]    Frequency of Communication with  Friends and Family: More than three times a week    Frequency of Social Gatherings with Friends and Family: Once a week    Attends Religious Services: 1 to 4 times per year    Active Member of [employer] or Organizations: No    Attends Music therapist: Never    Marital Status: Never married    Tobacco Counseling Counseling given: Not Answered   Clinical Intake:  Pre-visit preparation completed: Yes  Pain : No/denies pain        How often do you need to have someone help you when you read instructions, pamphlets, or other written materials from your doctor or pharmacy?: 1 - Never  Diabetic? Nutrition Risk Assessment:  Has the patient had any N/V/D within the last 2 months?  No  Does the patient have any non-healing wounds?  No  Has the patient had any unintentional weight loss or weight gain?  No   Diabetes:  Is the patient diabetic?  Yes  If diabetic, was a CBG obtained today?  No  Did the patient bring in their glucometer from home?   Audio visit How often do you monitor your CBG's? never.   Financial Strains and Diabetes Management:  Are you having any financial strains with the device, your supplies or your medication? No .  Does the patient want to be seen by Chronic Care Management for management of their diabetes?  No  Would the patient like to be referred to a Nutritionist or for Diabetic Management?  No   Diabetic Exams:  Diabetic Eye Exam: Overdue for diabetic eye exam. Pt has been advised about the importance in completing this exam. Patient advised to call and schedule an eye exam. Diabetic Foot Exam: Completed 02/12/21   Interpreter Needed?: No  Information entered by :: Beatris Ship, [place]   Activities of Daily Living    01/24/2022   11:14 AM 09/11/2021    2:29 PM  In your present state of health, do you have  any difficulty performing the following activities:  Hearing? 1   Comment wears hearing aids   Vision? 1   Difficulty concentrating  or making decisions? 0   Walking or climbing stairs? 1   Comment gets shortness of breath   Dressing or bathing? 0   Doing errands, shopping? 0 0  Preparing Food and eating ? N   Using the Toilet? N   In the past six months, have you accidently leaked urine? Y   Do you have problems with loss of bowel control? N   Managing your Medications? N   Managing your Finances? N   Housekeeping or managing your Housekeeping? N     Patient Care Team: Copland, Gay Filler, MD as PCP - General (Family Medicine) O'Neal, Cassie Freer, MD as PCP - Cardiology (Cardiology) Michael Boston, MD as Consulting Physician (General Surgery)  Indicate any recent Medical Services you may have received from other than [facility] providers in the past year (date may be approximate).     Assessment:   This is a routine wellness examination for Xiong.  Hearing/Vision screen No results found.  Dietary issues and exercise activities discussed: Current Exercise Habits: Home exercise routine, Type of exercise: treadmill;strength training/weights, Time (Minutes): 30, Frequency (Times/Week): 3, Weekly Exercise (Minutes/Week): 90, Intensity: Mild   Goals Addressed   None    Depression Screen    01/24/2022   11:12 AM 01/24/2022   11:11 AM 11/26/2021    3:06 PM 08/21/2021   10:54 AM 04/23/2021    9:32 AM 01/30/2021    1:35 PM 01/19/2021   11:58 AM  PHQ 2/9 Scores  PHQ - 2 Score 0 0 0 0 0 0 0    Fall Risk    01/24/2022   11:04 AM 08/21/2021   10:54 AM 04/23/2021    9:37 AM 03/26/2021    2:00 PM 01/30/2021    1:35 PM  Fall Risk   Falls in the past year? 1 0 0 0 0  Number falls in past yr: 0 0 0 0 0  Injury with Fall? 0 0 0 0 0  Risk for fall due to : No Fall Risks Impaired mobility;Impaired balance/gait Medication side effect No Fall Risks No Fall Risks  Follow up Falls evaluation completed Education provided Falls evaluation completed;Education provided Falls evaluation completed Falls evaluation completed    [place]:  Any stairs in or around the home? Yes  If so, are there any without handrails? Yes  Home free of loose throw rugs in walkways, pet beds, electrical cords, etc? Yes  Adequate lighting in your home to reduce risk of falls? Yes   ASSISTIVE DEVICES UTILIZED TO PREVENT FALLS:  Life alert? No  Use of a cane, walker or w/c? No  Grab bars in the bathroom? Yes  Shower chair or bench in shower? Yes  Elevated toilet seat or a handicapped toilet?  Comfort height  TIMED UP AND GO:  Was the test performed?  No, audio visit .   Cognitive Function:        01/24/2022   11:24 AM  6CIT Screen  What Year? 0 points  What month? 0 points  What time? 0 points  Count back from 20 4 points  Months in reverse 0 points  Repeat phrase 2 points  Total Score 6 points    Immunizations Immunization History  Administered Date(s) Administered   Fluad Quad(high Dose 65+) 10/18/2019, 09/27/2021   Influenza-Unspecified 10/20/2013, 10/30/2020, 09/29/2021  PFIZER(Purple Top)SARS-COV-2 Vaccination 02/05/2019, 03/08/2019, 10/22/2019   PNEUMOCOCCAL CONJUGATE-20 10/30/2020   Pfizer Covid-19 Vaccine Bivalent Booster 21yr & up 10/30/2020   Pneumococcal Conjugate-13 11/25/2019   Pneumococcal Polysaccharide-23 12/27/2007   Respiratory Syncytial Virus Vaccine,Recomb Aduvanted(Arexvy) 09/24/2021   Td 11/27/2007, 07/31/2020   Zoster Recombinat (Shingrix) 07/31/2020, 01/30/2021   Zoster, Live 12/27/2006    TDAP status: Up to date  Flu Vaccine status: Up to date  Pneumococcal vaccine status: Up to date  Covid-19 vaccine status: Information provided on how to obtain vaccines.   Qualifies for Shingles Vaccine? Yes   Zostavax completed Yes   Shingrix Completed?: Yes  Screening Tests Health Maintenance  Topic Date Due   OPHTHALMOLOGY EXAM  Never done   COVID-19 Vaccine (5 - 2023-24 season) 09/21/2021   Medicare Annual Wellness (AWV)  01/19/2022   FOOT EXAM  02/12/2022    HEMOGLOBIN A1C  05/07/2022   DTaP/Tdap/Td (3 - Tdap) 08/01/2030   Pneumonia Vaccine 89 Years old  Completed   INFLUENZA VACCINE  Completed   Zoster Vaccines- Shingrix  Completed   HPV VACCINES  Aged Out    Health Maintenance  Health Maintenance Due  Topic Date Due   OPHTHALMOLOGY EXAM  Never done   COVID-19 Vaccine (5 - 2023-24 season) 09/21/2021   Medicare Annual Wellness (AWV)  01/19/2022    Colorectal cancer screening: No longer required.   Lung Cancer Screening: (Low Dose CT Chest recommended if Age 87-80years, 30 pack-year currently smoking OR have quit w/in 15years.) does not qualify.   Additional Screening:  Hepatitis C Screening: does not qualify  Vision Screening: Recommended annual ophthalmology exams for early detection of glaucoma and other disorders of the eye. Is the patient up to date with their annual eye exam?  Yes  Who is the provider or what is the name of the office in which the patient attends annual eye exams? Doesn't know name If pt is not established with a provider, would they like to be referred to a provider to establish care? No .   Dental Screening: Recommended annual dental exams for proper oral hygiene  Community Resource Referral / Chronic Care Management: CRR required this visit?  No   CCM required this visit?  No      Plan:     I have personally reviewed and noted the following in the patient's chart:   Medical and social history Use of alcohol, tobacco or illicit drugs  Current medications and supplements including opioid prescriptions. Patient is not currently taking opioid prescriptions. Functional ability and status Nutritional status Physical activity Advanced directives List of other physicians Hospitalizations, surgeries, and ER visits in previous 12 months Vitals Screenings to include cognitive, depression, and falls Referrals and appointments  In addition, I have reviewed and discussed with patient certain  preventive protocols, quality metrics, and best practice recommendations. A written personalized care plan for preventive services as well as general preventive health recommendations were provided to patient.   Due to this being a telephonic visit, the after visit summary with patients personalized plan was offered to patient via mail or my-chart. Per request, patient was mailed a copy of AVS.   BBeatris Ship [place]  01/24/2022   Nurse Notes: None

## 2022-02-21 DIAGNOSIS — U071 COVID-19: Secondary | ICD-10-CM | POA: Diagnosis not present

## 2022-02-23 DIAGNOSIS — H5213 Myopia, bilateral: Secondary | ICD-10-CM | POA: Diagnosis not present

## 2022-02-23 DIAGNOSIS — H43393 Other vitreous opacities, bilateral: Secondary | ICD-10-CM | POA: Diagnosis not present

## 2022-03-05 ENCOUNTER — Ambulatory Visit (HOSPITAL_BASED_OUTPATIENT_CLINIC_OR_DEPARTMENT_OTHER)
Admission: RE | Admit: 2022-03-05 | Discharge: 2022-03-05 | Disposition: A | Discharge status: Home / Self Care | Payer: 59 | Source: Ambulatory Visit | Attending: Family | Admitting: Family

## 2022-03-05 ENCOUNTER — Encounter: Payer: Self-pay | Admitting: Family

## 2022-03-05 ENCOUNTER — Ambulatory Visit (INDEPENDENT_AMBULATORY_CARE_PROVIDER_SITE_OTHER): Payer: 59 | Admitting: Family

## 2022-03-05 VITALS — BP 114/68 | HR 82 | Ht 69.0 in | Wt 146.0 lb

## 2022-03-05 DIAGNOSIS — J439 Emphysema, unspecified: Secondary | ICD-10-CM

## 2022-03-05 DIAGNOSIS — J441 Chronic obstructive pulmonary disease with (acute) exacerbation: Secondary | ICD-10-CM | POA: Diagnosis not present

## 2022-03-05 DIAGNOSIS — J4489 Other specified chronic obstructive pulmonary disease: Secondary | ICD-10-CM | POA: Diagnosis not present

## 2022-03-05 DIAGNOSIS — R531 Weakness: Secondary | ICD-10-CM | POA: Diagnosis not present

## 2022-03-05 DIAGNOSIS — R059 Cough, unspecified: Secondary | ICD-10-CM | POA: Diagnosis not present

## 2022-03-05 MED ORDER — PREDNISONE 20 MG PO TABS: ORAL_TABLET | ORAL | 0 refills | Status: DC

## 2022-03-05 MED ORDER — DOXYCYCLINE HYCLATE 100 MG PO TABS: 100.0000 mg | ORAL_TABLET | Freq: Two times a day (BID) | ORAL | 0 refills | Status: DC

## 2022-03-05 NOTE — Progress Notes (Signed)
Johnathan [name] is a 89 y.o. male with the following history as recorded in EpicCare:  Patient Active Problem List   Diagnosis Date Noted   GERD (gastroesophageal reflux disease) 09/11/2021   Acute on chronic respiratory failure with hypoxia ([place]) 09/10/2021   Interstitial pulmonary disease ([place]) 03/19/2021   Hypertension 03/19/2021   Oral candidiasis 03/05/2021   Chronic respiratory failure with hypoxia (HCC) 03/05/2021   Trigger finger, right ring finger 02/13/2021   COPD with chronic bronchitis and emphysema ([place]) 04/26/2020   COVID-19 02/25/2020   Shingles 11/14/2019   Raynaud's syndrome 11/14/2019   Skin cancer 11/14/2019   Left inguinal hernia s/p lap repair w mesh 05/06/2019 05/06/2019   Recurrent right inguinal hernia s/p lap repair w mesh 05/06/2019 03/01/2019   Diabetes mellitus ([place]) 03/01/2019   CAP (community acquired pneumonia) 04/21/2014   COPD with acute exacerbation ([place]) 04/21/2014    Current Outpatient Medications  Medication Sig Dispense Refill   albuterol (VENTOLIN HFA) 108 (90 Base) MCG/ACT inhaler Inhale 2 puffs into the lungs every 6 (six) hours as needed for wheezing or shortness of breath. 18 g 2   amLODipine (NORVASC) 5 MG tablet Take 1 tablet (5 mg total) by mouth daily. 90 tablet 1   aspirin EC 81 MG tablet Take 81 mg by mouth every morning.     B Complex-C (SUPER B COMPLEX PO) Take 1 tablet by mouth at bedtime.     benzonatate (TESSALON) 200 MG capsule Take 1 capsule (200 mg total) by mouth 3 (three) times daily as needed for cough. 20 capsule 0   Cholecalciferol (VITAMIN D3 GUMMIES PO) Take 2 tablets by mouth every morning.     Coenzyme Q10 (COQ10 PO) Take 1 capsule by mouth every morning.     doxycycline (VIBRA-TABS) 100 MG tablet Take 1 tablet (100 mg total) by mouth 2 (two) times daily. 14 tablet 0   Flaxseed, Linseed, (FLAXSEED OIL) 1200 MG CAPS Take 1 capsule by mouth at bedtime.     Fluticasone-Umeclidin-Vilant (TRELEGY ELLIPTA) 100-62.5-25 MCG/ACT  AEPB Inhale 1 puff into the lungs at bedtime.     furosemide (LASIX) 20 MG tablet Take 1 tablet (20 mg total) by mouth daily as needed for edema. 30 tablet 0   Ginkgo Biloba EXTR Take 1 tablet by mouth 2 (two) times daily.     guaiFENesin (MUCINEX) 600 MG 12 hr tablet Take 2 tablets (1,200 mg total) by mouth 2 (two) times daily. (Patient taking differently: Take 1,200 mg by mouth as needed.) 60 tablet 0   ketoconazole (NIZORAL) 2 % cream Apply 1 Application topically daily. Use as needed for fungal infection 15 g 1   loratadine (CLARITIN) 10 MG tablet TAKE 1 TABLET EVERY DAY (Patient taking differently: Take 10 mg by mouth every morning.) 90 tablet 1   meloxicam (MOBIC) 15 MG tablet TAKE 1 TABLET BY MOUTH DAILY AS  NEEDED FOR JOINT PAIN 90 tablet 3   metFORMIN (GLUCOPHAGE) 500 MG tablet Take 0.5 tablets (250 mg total) by mouth daily. 90 tablet 3   Misc Natural Products (HIMALAYAN GOJI PO) Take 6 Doses by mouth daily. Goji berries     Multiple Vitamin (MULTIVITAMIN WITH MINERALS) TABS tablet Take 1 tablet by mouth every morning.     omeprazole (PRILOSEC) 20 MG capsule Take 1 capsule (20 mg total) by mouth 2 (two) times daily. 180 capsule 3   OXYGEN Inhale 2 L into the lungs continuous.     pravastatin (PRAVACHOL) 40 MG tablet Take 0.5  tablets (20 mg total) by mouth daily. 45 tablet 1   primidone (MYSOLINE) 50 MG tablet TAKE 1 TABLET BY MOUTH TWICE  DAILY 200 tablet 2   sildenafil (VIAGRA) 100 MG tablet Take 0.5-1 tablets (50-100 mg total) by mouth daily as needed for erectile dysfunction. 10 tablet 6   terazosin (HYTRIN) 1 MG capsule Take 1 capsule (1 mg total) by mouth 2 (two) times daily. 180 capsule 1   Tetrahydrozoline HCl (VISINE OP) Place 1 drop into both eyes 2 (two) times daily.     traMADol (ULTRAM) 50 MG tablet Take 1 tablet (50 mg total) by mouth every 12 (twelve) hours as needed for moderate pain. 30 tablet 1   triamcinolone cream (KENALOG) 0.1 % Apply 1 Application topically 2 (two)  times daily as needed (rash/itching).     predniSONE (DELTASONE) 20 MG tablet Take 79m for 5 days 10 tablet 0   No current facility-administered medications for this visit.    Allergies: Patient has no known allergies.  Past Medical History:  Diagnosis Date   Anal fissure    COPD (chronic obstructive pulmonary disease) ([place]    Diverticulosis    DM type 2 (diabetes mellitus, type 2) (HCC)    GERD (gastroesophageal reflux disease)    uses prilosec    Hypertension    Pneumonia 2018   Raynaud's disease    Shortness of breath dyspnea    WITH EXERTION   Sinusitis    Tremors of nervous system     Past Surgical History:  Procedure Laterality Date   COLONOSCOPY  03/27/2005   ESOPHAGOGASTRODUODENOSCOPY (EGD) WITH PROPOFOL N/A 02/08/2021   Procedure: ESOPHAGOGASTRODUODENOSCOPY (EGD) WITH PROPOFOL;  Surgeon: JMilus Banister MD;  Location: [facility];  Service: Endoscopy;  Laterality: N/A;   HERNIA REPAIR     INGUINAL HERNIA REPAIR N/A 05/06/2019   Procedure: LAPAROSCOPIC RIGHT AND LEFT INGUINAL HERNIA(S) REPAIR;  Surgeon: GMichael Boston MD;  [facility]  Service: General;  Laterality: N/A;    Family History  Problem Relation Age of Onset   Dementia Brother    Diabetes Brother    Heart attack Father    Dementia Father     Social History   Tobacco Use   Smoking status: Former    Packs/day: 1.00    Years: 45.00    Total pack years: 45.00    Types: Cigarettes    Quit date: 01/21/1994    Years since quitting: 28.1   Smokeless tobacco: Never  Substance Use Topics   Alcohol use: Yes    Comment: a drink daily with dinner    Subjective:   Concerns for worsening cough/ congestion x 1 week; has been feeling increased weakness; known COPD/ ILD- prone to recurrent COPD exacerbations; no fever; is not having to increase his 02- remaining at 2L for the most part; is taking asthma medication as prescribed;  Last COPD flare was in November 2023- did feel that it  resolved completely;   Objective:  Vitals:   03/05/22 1134  BP: 114/68  Pulse: 82  SpO2: 98%  Weight: 146 lb (66.2 kg)  Height: 5' 9"$  (1.753 m)    General: Well developed, well nourished, in no acute distress  Skin : Warm and dry.  Head: Normocephalic and atraumatic  Eyes: Sclera and conjunctiva clear; pupils round and reactive to light; extraocular movements intact  Ears: External normal; canals clear; tympanic membranes normal  Oropharynx: Pink, supple. No suspicious lesions  Neck: Supple without thyromegaly, adenopathy  Lungs: Respirations unlabored; clear to auscultation bilaterally without wheeze, rales, rhonchi  CVS exam: normal rate and regular rhythm.  Neurologic: Alert and oriented; speech intact; face symmetrical; in wheelchair;   Assessment:  1. COPD with acute exacerbation ([place])   2. COPD with chronic bronchitis and emphysema ([place])     Plan:  Due to subjective concern for worsening weakness, will update CXR to ensure no pneumonia; Rx for Doxycycline and Prednisone which he has used successfully in the past; follow up to be determined based on CXR results; increase fluids, rest;   No follow-ups on file.  Orders Placed This Encounter  Procedures   DG Chest 2 View    Standing Status:   Future    Number of Occurrences:   1    Standing Expiration Date:   03/06/2023    Order Specific Question:   Reason for Exam (SYMPTOM  OR DIAGNOSIS REQUIRED)    Answer:   cough/ weakness/ rule out pneumonia    Order Specific Question:   Preferred imaging location?    Answer:   [facility]    Requested Prescriptions   Signed Prescriptions Disp Refills   doxycycline (VIBRA-TABS) 100 MG tablet 14 tablet 0    Sig: Take 1 tablet (100 mg total) by mouth 2 (two) times daily.   predniSONE (DELTASONE) 20 MG tablet 10 tablet 0    Sig: Take 45m for 5 days

## 2022-03-06 ENCOUNTER — Other Ambulatory Visit: Payer: Self-pay | Admitting: Family Medicine

## 2022-03-12 DIAGNOSIS — Z85828 Personal history of other malignant neoplasm of skin: Secondary | ICD-10-CM | POA: Diagnosis not present

## 2022-03-12 DIAGNOSIS — D692 Other nonthrombocytopenic purpura: Secondary | ICD-10-CM | POA: Diagnosis not present

## 2022-03-12 DIAGNOSIS — L821 Other seborrheic keratosis: Secondary | ICD-10-CM | POA: Diagnosis not present

## 2022-03-12 DIAGNOSIS — Z85820 Personal history of malignant melanoma of skin: Secondary | ICD-10-CM | POA: Diagnosis not present

## 2022-03-12 DIAGNOSIS — D225 Melanocytic nevi of trunk: Secondary | ICD-10-CM | POA: Diagnosis not present

## 2022-03-12 DIAGNOSIS — D2271 Melanocytic nevi of right lower limb, including hip: Secondary | ICD-10-CM | POA: Diagnosis not present

## 2022-03-15 ENCOUNTER — Ambulatory Visit (INDEPENDENT_AMBULATORY_CARE_PROVIDER_SITE_OTHER): Payer: 59 | Admitting: Family

## 2022-03-15 ENCOUNTER — Encounter: Payer: Self-pay | Admitting: Family

## 2022-03-15 VITALS — BP 124/72 | HR 73 | Ht 69.0 in

## 2022-03-15 DIAGNOSIS — J441 Chronic obstructive pulmonary disease with (acute) exacerbation: Secondary | ICD-10-CM | POA: Diagnosis not present

## 2022-03-15 MED ORDER — PREDNISONE 10 MG PO TABS: ORAL_TABLET | ORAL | 0 refills | Status: DC

## 2022-03-15 NOTE — Progress Notes (Signed)
Johnathan [name] is a 89 y.o. male with the following history as recorded in EpicCare:  Patient Active Problem List   Diagnosis Date Noted   GERD (gastroesophageal reflux disease) 09/11/2021   Acute on chronic respiratory failure with hypoxia ([place]) 09/10/2021   Interstitial pulmonary disease ([place]) 03/19/2021   Hypertension 03/19/2021   Oral candidiasis 03/05/2021   Chronic respiratory failure with hypoxia (HCC) 03/05/2021   Trigger finger, right ring finger 02/13/2021   COPD with chronic bronchitis and emphysema ([place]) 04/26/2020   COVID-19 02/25/2020   Shingles 11/14/2019   Raynaud's syndrome 11/14/2019   Skin cancer 11/14/2019   Left inguinal hernia s/p lap repair w mesh 05/06/2019 05/06/2019   Recurrent right inguinal hernia s/p lap repair w mesh 05/06/2019 03/01/2019   Diabetes mellitus ([place]) 03/01/2019   CAP (community acquired pneumonia) 04/21/2014   COPD with acute exacerbation ([place]) 04/21/2014    Current Outpatient Medications  Medication Sig Dispense Refill   albuterol (VENTOLIN HFA) 108 (90 Base) MCG/ACT inhaler Inhale 2 puffs into the lungs every 6 (six) hours as needed for wheezing or shortness of breath. 18 g 2   amLODipine (NORVASC) 5 MG tablet Take 1 tablet (5 mg total) by mouth daily. 90 tablet 1   aspirin EC 81 MG tablet Take 81 mg by mouth every morning.     B Complex-C (SUPER B COMPLEX PO) Take 1 tablet by mouth at bedtime.     Cholecalciferol (VITAMIN D3 GUMMIES PO) Take 2 tablets by mouth every morning.     Coenzyme Q10 (COQ10 PO) Take 1 capsule by mouth every morning.     doxycycline (VIBRA-TABS) 100 MG tablet Take 1 tablet (100 mg total) by mouth 2 (two) times daily. 14 tablet 0   Flaxseed, Linseed, (FLAXSEED OIL) 1200 MG CAPS Take 1 capsule by mouth at bedtime.     Fluticasone-Umeclidin-Vilant (TRELEGY ELLIPTA) 100-62.5-25 MCG/ACT AEPB Inhale 1 puff into the lungs at bedtime.     furosemide (LASIX) 20 MG tablet Take 1 tablet (20 mg total) by mouth daily as needed  for edema. 30 tablet 0   Ginkgo Biloba EXTR Take 1 tablet by mouth 2 (two) times daily.     guaiFENesin (MUCINEX) 600 MG 12 hr tablet Take 2 tablets (1,200 mg total) by mouth 2 (two) times daily. (Patient taking differently: Take 1,200 mg by mouth as needed.) 60 tablet 0   ketoconazole (NIZORAL) 2 % cream Apply 1 Application topically daily. Use as needed for fungal infection 15 g 1   loratadine (CLARITIN) 10 MG tablet TAKE 1 TABLET EVERY DAY (Patient taking differently: Take 10 mg by mouth every morning.) 90 tablet 1   meloxicam (MOBIC) 15 MG tablet TAKE 1 TABLET BY MOUTH DAILY AS  NEEDED FOR JOINT PAIN 90 tablet 3   metFORMIN (GLUCOPHAGE) 500 MG tablet Take 0.5 tablets (250 mg total) by mouth daily. 90 tablet 3   Misc Natural Products (HIMALAYAN GOJI PO) Take 6 Doses by mouth daily. Goji berries     Multiple Vitamin (MULTIVITAMIN WITH MINERALS) TABS tablet Take 1 tablet by mouth every morning.     omeprazole (PRILOSEC) 20 MG capsule TAKE 1 CAPSULE BY MOUTH TWICE  DAILY 180 capsule 1   OXYGEN Inhale 2 L into the lungs continuous.     pravastatin (PRAVACHOL) 40 MG tablet Take 0.5 tablets (20 mg total) by mouth daily. 45 tablet 1   predniSONE (DELTASONE) 10 MG tablet Take 2 tablets daily x 3 days, then 1 tablet daily x 3  days 9 tablet 0   primidone (MYSOLINE) 50 MG tablet TAKE 1 TABLET BY MOUTH TWICE  DAILY 200 tablet 2   sildenafil (VIAGRA) 100 MG tablet Take 0.5-1 tablets (50-100 mg total) by mouth daily as needed for erectile dysfunction. 10 tablet 6   terazosin (HYTRIN) 1 MG capsule Take 1 capsule (1 mg total) by mouth 2 (two) times daily. 180 capsule 1   Tetrahydrozoline HCl (VISINE OP) Place 1 drop into both eyes 2 (two) times daily.     traMADol (ULTRAM) 50 MG tablet Take 1 tablet (50 mg total) by mouth every 12 (twelve) hours as needed for moderate pain. 30 tablet 1   triamcinolone cream (KENALOG) 0.1 % Apply 1 Application topically 2 (two) times daily as needed (rash/itching).      benzonatate (TESSALON) 200 MG capsule Take 1 capsule (200 mg total) by mouth 3 (three) times daily as needed for cough. (Patient not taking: Reported on 03/15/2022) 20 capsule 0   predniSONE (DELTASONE) 20 MG tablet Take '40mg'$  for 5 days (Patient not taking: Reported on 03/15/2022) 10 tablet 0   No current facility-administered medications for this visit.    Allergies: Patient has no known allergies.  Past Medical History:  Diagnosis Date   Anal fissure    COPD (chronic obstructive pulmonary disease) ([place])    Diverticulosis    DM type 2 (diabetes mellitus, type 2) (HCC)    GERD (gastroesophageal reflux disease)    uses prilosec    Hypertension    Pneumonia 2018   Raynaud's disease    Shortness of breath dyspnea    WITH EXERTION   Sinusitis    Tremors of nervous system     Past Surgical History:  Procedure Laterality Date   COLONOSCOPY  03/27/2005   ESOPHAGOGASTRODUODENOSCOPY (EGD) WITH PROPOFOL N/A 02/08/2021   Procedure: ESOPHAGOGASTRODUODENOSCOPY (EGD) WITH PROPOFOL;  Surgeon: Milus Banister, MD;  Location: [facility];  Service: Endoscopy;  Laterality: N/A;   HERNIA REPAIR     INGUINAL HERNIA REPAIR N/A 05/06/2019   Procedure: LAPAROSCOPIC RIGHT AND LEFT INGUINAL HERNIA(S) REPAIR;  Surgeon: Michael Boston, MD;  Location: [facility];  Service: General;  Laterality: N/A;    Family History  Problem Relation Age of Onset   Dementia Brother    Diabetes Brother    Heart attack Father    Dementia Father     Social History   Tobacco Use   Smoking status: Former    Packs/day: 1.00    Years: 45.00    Total pack years: 45.00    Types: Cigarettes    Quit date: 01/21/1994    Years since quitting: 28.1   Smokeless tobacco: Never  Substance Use Topics   Alcohol use: Yes    Comment: a drink daily with dinner    Subjective:   Seen 10 days ago with COPD exacerbation; treated with prednisone and Doxycycline; CXR done at that visit was negative; patient is asking for  repeat COVID test due to persisting dry cough; notes that is feeling better but is concerned about persisting cough;   Objective:  Vitals:   03/15/22 1108  BP: 124/72  Pulse: 73  SpO2: 96%  Height: '5\' 9"'$  (1.753 m)    General: Well developed, well nourished, in no acute distress  Skin : Warm and dry.  Head: Normocephalic and atraumatic  Lungs: Respirations unlabored; clear to auscultation bilaterally without wheeze, rales, rhonchi  CVS exam: normal rate and regular rhythm.  Neurologic: Alert and oriented; speech  intact; face symmetrical; moves all extremities well; CNII-XII intact without focal deficit   Assessment:  1. COPD with acute exacerbation ([place])     Plan:   Marked improvement compared to visit 10 days ago; will extend prednisone for 6 more days; physical exam is reassuring;   No follow-ups on file.  No orders of the defined types were placed in this encounter.   Requested Prescriptions   Signed Prescriptions Disp Refills   predniSONE (DELTASONE) 10 MG tablet 9 tablet 0    Sig: Take 2 tablets daily x 3 days, then 1 tablet daily x 3 days

## 2022-03-15 NOTE — Patient Instructions (Signed)
Your lungs sound really good today; please take the prednisone as prescribed and plan to see Dr. Lorelei Pont in April as you are already scheduled. Let us know if you need anything before then.

## 2022-03-22 DIAGNOSIS — U071 COVID-19: Secondary | ICD-10-CM | POA: Diagnosis not present

## 2022-03-25 DIAGNOSIS — H2512 Age-related nuclear cataract, left eye: Secondary | ICD-10-CM | POA: Diagnosis not present

## 2022-03-25 DIAGNOSIS — H2511 Age-related nuclear cataract, right eye: Secondary | ICD-10-CM | POA: Diagnosis not present

## 2022-03-26 ENCOUNTER — Telehealth: Payer: Self-pay | Admitting: Family Medicine

## 2022-03-26 NOTE — Telephone Encounter (Signed)
Patient would like a call back to discuss his trulogy medication, he states he has other medication and is unsure if he is supposed to take them instead of the trulogy. Please advise.

## 2022-03-29 ENCOUNTER — Telehealth: Payer: Self-pay

## 2022-03-29 NOTE — Telephone Encounter (Signed)
ATC X1 LVM for patient to call the office back in regards to surgical clearance. If patient calls back please get scheduled with Dr. [name] or Roxan Diesel

## 2022-04-03 ENCOUNTER — Telehealth: Payer: Self-pay | Admitting: Nurse Practitioner

## 2022-04-03 NOTE — Telephone Encounter (Signed)
Fax received from Dr. Olene Craven with [facility] to perform a Cataract Extraction on patient.  Patient needs surgery clearance. Surgery is 04/10/2022. Patient was seen on 11/07/2021. Office protocol is a risk assessment can be sent to surgeon if patient has been seen in 60 days or less.   Sending to [employer] for risk assessment or recommendations if patient needs to be seen in office prior to surgical procedure.    Patient has office visit with Joellen Jersey on 04/04/2022

## 2022-04-04 ENCOUNTER — Ambulatory Visit: Payer: 59 | Admitting: Nurse Practitioner

## 2022-04-04 NOTE — Telephone Encounter (Signed)
No show to appt.

## 2022-04-05 ENCOUNTER — Telehealth: Payer: Self-pay | Admitting: *Deleted

## 2022-04-05 NOTE — Telephone Encounter (Signed)
ATC patient regarding appointment with Rexene Edison NP scheduled for Monday 04/08/22 at 11:30 am.  Left a detailed message that Tammy is unavailable at 11:30 am on Monday 3/18 and I have rescheduled him for Tuesday 3/19 at 4 pm.  Asked that he call on Monday after 8 am if this time does not work for him and we will reschedule for another time.  Advised that if 4 pm on 3/19 is ok with him, he will need to arrive at 3:45 pm for check in.  Will await return call.

## 2022-04-08 ENCOUNTER — Ambulatory Visit: Payer: 59 | Admitting: Adult Health

## 2022-04-08 NOTE — Telephone Encounter (Signed)
Patient came into the office unaware that his appointment was moved. Informed patient that provider was not in office. Patient confirmed he would keep his appointment tomorrow, 3/19 at 4pm. Nothing further needed.

## 2022-04-09 ENCOUNTER — Encounter: Payer: Self-pay | Admitting: Adult Health

## 2022-04-09 ENCOUNTER — Ambulatory Visit (INDEPENDENT_AMBULATORY_CARE_PROVIDER_SITE_OTHER): Payer: 59 | Admitting: Adult Health

## 2022-04-09 VITALS — BP 110/50 | HR 83 | Temp 98.3°F | Ht 69.0 in | Wt 151.8 lb

## 2022-04-09 DIAGNOSIS — J439 Emphysema, unspecified: Secondary | ICD-10-CM | POA: Diagnosis not present

## 2022-04-09 DIAGNOSIS — J9611 Chronic respiratory failure with hypoxia: Secondary | ICD-10-CM

## 2022-04-09 DIAGNOSIS — J4489 Other specified chronic obstructive pulmonary disease: Secondary | ICD-10-CM | POA: Diagnosis not present

## 2022-04-09 DIAGNOSIS — Z01811 Encounter for preprocedural respiratory examination: Secondary | ICD-10-CM | POA: Diagnosis not present

## 2022-04-09 NOTE — Assessment & Plan Note (Signed)
Continue on oxygen to make demands.

## 2022-04-09 NOTE — Patient Instructions (Signed)
Continue Trelegy 1 puff daily. Brush tongue and rinse mouth afterwards Continue Albuterol inhaler 2 puffs or 3 mL neb every 6 hours as needed for shortness of breath or wheezing. Mucinex As needed  for cough/congestion  Continue Oxygen 2 lpm for goal oxygen >88-90%  Continue loratadine 1 tab daily  Activity as tolerated  Good luck with up coming eye surgery.  Follow up with Dr. [name] in 4-6 months and As needed

## 2022-04-09 NOTE — Progress Notes (Signed)
@Patient  ID: Johnathan [name], male    DOB: [birth year], 89 y.o.   MRN: [id number]  Chief Complaint  Patient presents with   surgical clearance    Referring provider: Darreld Mclean, MD  HPI: 89 year old former smoker followed for COPD with emphysema and chronic bronchitis and chronic respiratory failure on oxygen  TEST/EVENTS :  02/04/2019 echocardiogram: EF 60 to 65%. Trivial MR. AR mild to moderate.  04/28/2020 HRCT chest: Atherosclerosis. Right and left pulmonary arteries are enlarged. Centrilobular and paraseptal emphysema. Peripheral and basilar predominant subpleural reticular densities and groundglass, asymmetric on the right with mild associated architectural distortion and calcified granuloma in posterior LUL. Could be postinfectious/postinflammatory in etiology. Indeterminate for UIP.  05/26/2020 PFTs: FVC 2.48 (67), FEV1 1.28 (50), ratio 51, TLC 73%, DLCO 36%. No BD.   CT chest October 29, 2021 shows severe emphysema, interval improvement -multifocal pulmonary infiltrates in the right upper lobe and right lower lobe have improved with mild residual groundglass pulmonary infiltrate likely representing a small amount of residual alveolitis  Pets: No pets Occupation: Worked miscellaneous jobs including farm, Personal assistant, Press photographer Exposures: No mold, hot tub, Customer service manager.  No feather pillows or comforter. Smoking history: 45-pack-year smoker.  Quit in the 1990s Travel history: No significant travel history Relevant family history: Father had COPD.  He was a smoker.  04/09/2022 Follow up : COPD with emphysema and chronic bronchitis and chronic respiratory failure on oxygen, preop clearance Patient returns for a 87-month follow-up.  Patient has underlying severe COPD with emphysema.  Patient says overall his breathing is doing okay.  He gets short of breath with heavy activities or prolonged walking.Patient remains on Trelegy inhaler daily.  Uses albuterol inhaler on occasion. Had a COPD  flare last month was treated with antibiotics and prednisone burst.  Patient says he is feeling better.  Patient denies any hemoptysis, chest pain, orth then conscious sedation opnea.  Chest x-ray last month showed no acute process.  Patient is planning upcoming cataract surgery needs a pulmonary preop risk assessment. Patient lives with a roommate.  Patient says he is fully independent.  Drives does all his house chores and shopping.  Patient has no shortness of breath at rest. Patient is on oxygen 2 L.  Has a POC.  Denies any increased oxygen demands.   No Known Allergies  Immunization History  Administered Date(s) Administered   Fluad Quad(high Dose 65+) 10/18/2019, 09/27/2021   Influenza-Unspecified 10/20/2013, 10/30/2020, 09/29/2021   PFIZER(Purple Top)SARS-COV-2 Vaccination 02/05/2019, 03/08/2019, 10/22/2019   PNEUMOCOCCAL CONJUGATE-20 10/30/2020   Pfizer Covid-19 Vaccine Bivalent Booster 21yrs & up 10/30/2020   Pneumococcal Conjugate-13 11/25/2019   Pneumococcal Polysaccharide-23 12/27/2007   Respiratory Syncytial Virus Vaccine,Recomb Aduvanted(Arexvy) 09/24/2021   Td 11/27/2007, 07/31/2020   Zoster Recombinat (Shingrix) 07/31/2020, 01/30/2021   Zoster, Live 12/27/2006    Past Medical History:  Diagnosis Date   Anal fissure    COPD (chronic obstructive pulmonary disease) ([place])    Diverticulosis    DM type 2 (diabetes mellitus, type 2) (HCC)    GERD (gastroesophageal reflux disease)    uses prilosec    Hypertension    Pneumonia 2018   Raynaud's disease    Shortness of breath dyspnea    WITH EXERTION   Sinusitis    Tremors of nervous system     Tobacco History: Social History   Tobacco Use  Smoking Status Former   Packs/day: 1.00   Years: 45.00   Additional pack years: 0.00   Total pack years: 45.00  Types: Cigarettes   Quit date: 01/21/1994   Years since quitting: 28.2  Smokeless Tobacco Never   Counseling given: Not Answered   Outpatient Medications  Prior to Visit  Medication Sig Dispense Refill   albuterol (VENTOLIN HFA) 108 (90 Base) MCG/ACT inhaler Inhale 2 puffs into the lungs every 6 (six) hours as needed for wheezing or shortness of breath. 18 g 2   amLODipine (NORVASC) 5 MG tablet Take 1 tablet (5 mg total) by mouth daily. 90 tablet 1   aspirin EC 81 MG tablet Take 81 mg by mouth every morning.     B Complex-C (SUPER B COMPLEX PO) Take 1 tablet by mouth at bedtime.     benzonatate (TESSALON) 200 MG capsule Take 1 capsule (200 mg total) by mouth 3 (three) times daily as needed for cough. 20 capsule 0   Cholecalciferol (VITAMIN D3 GUMMIES PO) Take 2 tablets by mouth every morning.     Coenzyme Q10 (COQ10 PO) Take 1 capsule by mouth every morning.     doxycycline (VIBRA-TABS) 100 MG tablet Take 1 tablet (100 mg total) by mouth 2 (two) times daily. 14 tablet 0   Flaxseed, Linseed, (FLAXSEED OIL) 1200 MG CAPS Take 1 capsule by mouth at bedtime.     Fluticasone-Umeclidin-Vilant (TRELEGY ELLIPTA) 100-62.5-25 MCG/ACT AEPB Inhale 1 puff into the lungs at bedtime.     furosemide (LASIX) 20 MG tablet Take 1 tablet (20 mg total) by mouth daily as needed for edema. 30 tablet 0   Ginkgo Biloba EXTR Take 1 tablet by mouth 2 (two) times daily.     guaiFENesin (MUCINEX) 600 MG 12 hr tablet Take 2 tablets (1,200 mg total) by mouth 2 (two) times daily. (Patient taking differently: Take 1,200 mg by mouth as needed.) 60 tablet 0   ketoconazole (NIZORAL) 2 % cream Apply 1 Application topically daily. Use as needed for fungal infection 15 g 1   loratadine (CLARITIN) 10 MG tablet TAKE 1 TABLET EVERY DAY (Patient taking differently: Take 10 mg by mouth every morning.) 90 tablet 1   meloxicam (MOBIC) 15 MG tablet TAKE 1 TABLET BY MOUTH DAILY AS  NEEDED FOR JOINT PAIN 90 tablet 3   metFORMIN (GLUCOPHAGE) 500 MG tablet Take 0.5 tablets (250 mg total) by mouth daily. 90 tablet 3   Misc Natural Products (HIMALAYAN GOJI PO) Take 6 Doses by mouth daily. Goji berries      Multiple Vitamin (MULTIVITAMIN WITH MINERALS) TABS tablet Take 1 tablet by mouth every morning.     omeprazole (PRILOSEC) 20 MG capsule TAKE 1 CAPSULE BY MOUTH TWICE  DAILY 180 capsule 1   OXYGEN Inhale 2 L into the lungs continuous.     pravastatin (PRAVACHOL) 40 MG tablet Take 0.5 tablets (20 mg total) by mouth daily. 45 tablet 1   predniSONE (DELTASONE) 10 MG tablet Take 2 tablets daily x 3 days, then 1 tablet daily x 3 days 9 tablet 0   predniSONE (DELTASONE) 20 MG tablet Take 40mg  for 5 days 10 tablet 0   primidone (MYSOLINE) 50 MG tablet TAKE 1 TABLET BY MOUTH TWICE  DAILY 200 tablet 2   sildenafil (VIAGRA) 100 MG tablet Take 0.5-1 tablets (50-100 mg total) by mouth daily as needed for erectile dysfunction. 10 tablet 6   terazosin (HYTRIN) 1 MG capsule Take 1 capsule (1 mg total) by mouth 2 (two) times daily. 180 capsule 1   Tetrahydrozoline HCl (VISINE OP) Place 1 drop into both eyes 2 (two) times daily.  traMADol (ULTRAM) 50 MG tablet Take 1 tablet (50 mg total) by mouth every 12 (twelve) hours as needed for moderate pain. 30 tablet 1   triamcinolone cream (KENALOG) 0.1 % Apply 1 Application topically 2 (two) times daily as needed (rash/itching).     No facility-administered medications prior to visit.     Review of Systems:   Constitutional:   No  weight loss, night sweats,  Fevers, chills,  +fatigue, or  lassitude.  HEENT:   No headaches,  Difficulty swallowing,  Tooth/dental problems, or  Sore throat,                No sneezing, itching, ear ache, nasal congestion, post nasal drip,   CV:  No chest pain,  Orthopnea, PND, swelling in lower extremities, anasarca, dizziness, palpitations, syncope.   GI  No heartburn, indigestion, abdominal pain, nausea, vomiting, diarrhea, change in bowel habits, loss of appetite, bloody stools.   Resp:   No chest wall deformity  Skin: no rash or lesions.  GU: no dysuria, change in color of urine, no urgency or frequency.  No flank  pain, no hematuria   MS:  No joint pain or swelling.  No decreased range of motion.  No back pain.    Physical Exam  BP (!) 110/50 (BP Location: Left Arm, Patient Position: Sitting, Cuff Size: Normal)   Pulse 83   Temp 98.3 F (36.8 C) (Oral)   Ht 5\' 9"  (1.753 m)   Wt 151 lb 12.8 oz (68.9 kg)   SpO2 91%   BMI 22.42 kg/m   GEN: A/Ox3; pleasant , NAD, elderly on O2    HEENT:  [place]/AT,  NOSE-clear, THROAT-clear, no lesions, no postnasal drip or exudate noted.   NECK:  Supple w/ fair ROM; no JVD; normal carotid impulses w/o bruits; no thyromegaly or nodules palpated; no lymphadenopathy.    RESP  Clear  P & A; w/o, wheezes/ rales/ or rhonchi. no accessory muscle use, no dullness to percussion  CARD:  RRR, no m/r/g, no peripheral edema, pulses intact, no cyanosis or clubbing.  GI:   Soft & nt; nml bowel sounds; no organomegaly or masses detected.   Musco: Warm bil, no deformities or joint swelling noted.   Neuro: alert, no focal deficits noted.    Skin: Warm, no lesions or rashes    Lab Results:  CBC   BMET   BNP  Imaging: No results found.       Latest Ref Rng & Units 05/26/2020   11:59 AM  PFT Results  FVC-Pre L 2.48   FVC-Predicted Pre % 67   FVC-Post L 2.41   FVC-Predicted Post % 66   Pre FEV1/FVC % % 52   Post FEV1/FCV % % 51   FEV1-Pre L 1.28   FEV1-Predicted Pre % 50   FEV1-Post L 1.23   DLCO uncorrected ml/min/mmHg 7.92   DLCO UNC% % 34   DLCO corrected ml/min/mmHg 8.38   DLCO COR %Predicted % 36   DLVA Predicted % 52   TLC L 5.03   TLC % Predicted % 73   RV % Predicted % 80     No results found for: "NITRICOXIDE"      Assessment & Plan:   COPD with chronic bronchitis and emphysema (HCC) COPD exacerbation in February now appears to be resolved.  Patient appears to be back to baseline.  Will continue on current triple therapy maintenance regimen.  Plan  Patient Instructions  Continue Trelegy 1 puff daily.  Brush tongue and rinse  mouth afterwards Continue Albuterol inhaler 2 puffs or 3 mL neb every 6 hours as needed for shortness of breath or wheezing. Mucinex As needed  for cough/congestion  Continue Oxygen 2 lpm for goal oxygen >88-90%  Continue loratadine 1 tab daily  Activity as tolerated  Good luck with up coming eye surgery.  Follow up with Dr. [name] in 4-6 months and As needed       Chronic respiratory failure with hypoxia ([place]) Continue on oxygen to make demands.  Preop pulmonary/respiratory exam Pulmonary preop risk assessment for outpatient cataract surgery.  Patient has underlying severe COPD with emphysema that is oxygen dependent however appears to be stable.  He has at least a moderate risk from a pulmonary standpoint.  Would avoid general anesthesia if possible.  Continue on oxygen to maintain O2 saturations at goal.  Postop mobility as soon as able.  Reviewed in detail potential pulmonary complications with patient.  Patient is fully independent and continues to drive.  Patient says his vision is getting worse and will lose his independence if not able to have cataract surgery.    Major Pulmonary risks identified in the multifactorial risk analysis are but not limited to a) pneumonia; b) recurrent intubation risk; c) prolonged or recurrent acute respiratory failure needing mechanical ventilation; d) prolonged hospitalization; e) DVT/Pulmonary embolism; f) Acute Pulmonary edema  Recommend . Short duration of surgery as much as possible and avoid paralytics  . Recovery in step down or ICU with Pulmonary consultation if indicated  . Aggressive pulmonary toilet with o2, bronchodilatation, and incentive spirometry and early ambulation      Trysta Showman, NP 04/09/2022

## 2022-04-09 NOTE — Assessment & Plan Note (Signed)
Pulmonary preop risk assessment for outpatient cataract surgery.  Patient has underlying severe COPD with emphysema that is oxygen dependent however appears to be stable.  He has at least a moderate risk from a pulmonary standpoint.  Would avoid general anesthesia if possible.  Continue on oxygen to maintain O2 saturations at goal.  Postop mobility as soon as able.  Reviewed in detail potential pulmonary complications with patient.  Patient is fully independent and continues to drive.  Patient says his vision is getting worse and will lose his independence if not able to have cataract surgery.    Major Pulmonary risks identified in the multifactorial risk analysis are but not limited to a) pneumonia; b) recurrent intubation risk; c) prolonged or recurrent acute respiratory failure needing mechanical ventilation; d) prolonged hospitalization; e) DVT/Pulmonary embolism; f) Acute Pulmonary edema  Recommend . Short duration of surgery as much as possible and avoid paralytics  . Recovery in step down or ICU with Pulmonary consultation if indicated  . Aggressive pulmonary toilet with o2, bronchodilatation, and incentive spirometry and early ambulation

## 2022-04-09 NOTE — Assessment & Plan Note (Signed)
COPD exacerbation in February now appears to be resolved.  Patient appears to be back to baseline.  Will continue on current triple therapy maintenance regimen.  Plan  Patient Instructions  Continue Trelegy 1 puff daily. Brush tongue and rinse mouth afterwards Continue Albuterol inhaler 2 puffs or 3 mL neb every 6 hours as needed for shortness of breath or wheezing. Mucinex As needed  for cough/congestion  Continue Oxygen 2 lpm for goal oxygen >88-90%  Continue loratadine 1 tab daily  Activity as tolerated  Good luck with up coming eye surgery.  Follow up with Dr. [name] in 4-6 months and As needed

## 2022-04-10 ENCOUNTER — Other Ambulatory Visit: Payer: Self-pay | Admitting: Family Medicine

## 2022-04-10 ENCOUNTER — Telehealth: Payer: Self-pay | Admitting: Family Medicine

## 2022-04-10 NOTE — Telephone Encounter (Signed)
Please see below: pt just saw Pulmonology. Should we differ?

## 2022-04-10 NOTE — Telephone Encounter (Signed)
OV notes and clearance form have been faxed back to [facility]. Nothing further needed at this time. °

## 2022-04-10 NOTE — Telephone Encounter (Signed)
Called him back- no answer.  LMOM- please send me a mychart message with his concerns or consult with pulmonology

## 2022-04-10 NOTE — Telephone Encounter (Signed)
Pt states he saw his pulmonologist yesterday and after he left he developed a deep cough. He seemed reluctant to notify the pulmonologist and would like pcp to send in something other than cough syrup.

## 2022-04-11 ENCOUNTER — Ambulatory Visit (INDEPENDENT_AMBULATORY_CARE_PROVIDER_SITE_OTHER): Payer: 59 | Admitting: Family Medicine

## 2022-04-11 ENCOUNTER — Telehealth: Payer: Self-pay

## 2022-04-11 ENCOUNTER — Encounter: Payer: Self-pay | Admitting: Family Medicine

## 2022-04-11 DIAGNOSIS — J439 Emphysema, unspecified: Secondary | ICD-10-CM | POA: Diagnosis not present

## 2022-04-11 DIAGNOSIS — J4489 Other specified chronic obstructive pulmonary disease: Secondary | ICD-10-CM

## 2022-04-11 MED ORDER — BENZONATATE 200 MG PO CAPS: 200.0000 mg | ORAL_CAPSULE | Freq: Three times a day (TID) | ORAL | 0 refills | Status: DC | PRN

## 2022-04-11 MED ORDER — PREDNISONE 20 MG PO TABS: 40.0000 mg | ORAL_TABLET | Freq: Every day | ORAL | 0 refills | Status: AC

## 2022-04-11 MED ORDER — AMOXICILLIN-POT CLAVULANATE 875-125 MG PO TABS: 1.0000 | ORAL_TABLET | Freq: Two times a day (BID) | ORAL | 0 refills | Status: DC

## 2022-04-11 NOTE — Telephone Encounter (Signed)
Lm x2 for patient.   

## 2022-04-11 NOTE — Patient Instructions (Signed)
Starting Augmentin, Prednisone, and Tessalon.  Continue regular medications and as needed albuterol.  Continue supportive measures including rest, hydration, humidifier use, steam showers, warm compresses to sinuses, warm liquids with lemon and honey, and over-the-counter cough, cold, and analgesics as needed.   Please contact office for follow-up if symptoms do not improve or worsen. Seek emergency care if symptoms become severe.

## 2022-04-11 NOTE — Progress Notes (Signed)
Acute Office Visit  Subjective:     Patient ID: Johnathan [name], male    DOB: 01-23-1932, 89 y.o.   MRN: [id number]  CC: cough.   HPI Patient is in today for cough.  Patient reports he started with cold like symptoms last week and he has progressively gotten worse. He is reporting productive cough, wheezing, worsening dyspnea, fatigue. He has been using albuterol more frequently. Still on 2L O2 at baseline. He has not had any sinus pressure, headaches, fevers, chills. He has been getting minimal improvement with Mucinex/Robitussin.    ROS All review of systems negative except what is listed in the HPI      Objective:    BP (!) 121/58   Pulse 83   Temp (!) 97.5 F (36.4 C)   Ht 5\' 9"  (1.753 m)   Wt 150 lb 9.6 oz (68.3 kg)   SpO2 95% Comment: 2L  BMI 22.24 kg/m    Physical Exam Vitals reviewed.  Constitutional:      Appearance: Normal appearance.  HENT:     Head: Normocephalic and atraumatic.     Mouth/Throat:     Mouth: Mucous membranes are moist.     Pharynx: Oropharynx is clear.  Cardiovascular:     Rate and Rhythm: Normal rate and regular rhythm.     Pulses: Normal pulses.     Heart sounds: Normal heart sounds.  Pulmonary:     Effort: Pulmonary effort is normal.     Breath sounds: Normal breath sounds. No rhonchi or rales.     Comments: Mildly diminished  Skin:    General: Skin is warm and dry.  Neurological:     Mental Status: He is alert and oriented to person, place, and time.  Psychiatric:        Mood and Affect: Mood normal.        Behavior: Behavior normal.        Thought Content: Thought content normal.        Judgment: Judgment normal.     No results found for any visits on 04/11/22.      Assessment & Plan:   Problem List Items Addressed This Visit     COPD with chronic bronchitis and emphysema (HCC)   Relevant Medications   amoxicillin-clavulanate (AUGMENTIN) 875-125 MG tablet   predniSONE (DELTASONE) 20 MG tablet   benzonatate  (TESSALON) 200 MG capsule  Starting Augmentin, Prednisone, and Tessalon.  Continue regular medications and as needed albuterol.  Continue supportive measures including rest, hydration, humidifier use, steam showers, warm compresses to sinuses, warm liquids with lemon and honey, and over-the-counter cough, cold, and analgesics as needed.      Meds ordered this encounter  Medications   amoxicillin-clavulanate (AUGMENTIN) 875-125 MG tablet    Sig: Take 1 tablet by mouth 2 (two) times daily.    Dispense:  20 tablet    Refill:  0    Order Specific Question:   Supervising Provider    Answer:   Penni Homans A [[id number]]   predniSONE (DELTASONE) 20 MG tablet    Sig: Take 2 tablets (40 mg total) by mouth daily with breakfast for 5 days.    Dispense:  10 tablet    Refill:  0    Order Specific Question:   Supervising Provider    Answer:   Penni Homans A [[id number]]   benzonatate (TESSALON) 200 MG capsule    Sig: Take 1 capsule (200 mg total) by mouth 3 (three) times daily  as needed for cough.    Dispense:  20 capsule    Refill:  0    Order Specific Question:   Supervising Provider    Answer:   Penni Homans A [id number]    Return if symptoms worsen or fail to improve.  Terrilyn Saver, NP

## 2022-04-11 NOTE — Telephone Encounter (Signed)
Received epic secure chat from Rexene Edison, NP- PCP reached out to her after patient contacted PCP due to deep cough.   Lm for patient to gather additional information.

## 2022-04-12 NOTE — Telephone Encounter (Signed)
Lm x3 for patient.  Will close encounter per office protocol.  Routing to Tammy to make aware.

## 2022-04-14 ENCOUNTER — Other Ambulatory Visit: Payer: Self-pay | Admitting: Family Medicine

## 2022-04-22 DIAGNOSIS — U071 COVID-19: Secondary | ICD-10-CM | POA: Diagnosis not present

## 2022-05-03 NOTE — Patient Instructions (Incomplete)
It was good to see you again today, I will be in touch with your labs Assuming all is well please see me in 4 to 6 months We will get you referred to physical therapy to work on balance and to urology for your other concerns Please stop by imaging on the ground floor today as well to have x-rays taken of your lower back

## 2022-05-03 NOTE — Progress Notes (Unsigned)
[facility] [address], [address] [place], [place] [address] [phone]  Date:  05/06/2022   Name:  Johnathan [name]   DOB:  [birth year]   MRN:  [id number]  PCP:  Pearline Cables, MD    Chief Complaint: No chief complaint on file.   History of Present Illness:  Johnathan [name] is a 89 y.o. very pleasant male patient who presents with the following:  Patient seen today for periodic follow-up Most recent visit with myself was in November History of emphysema, diabetes, COPD, hypertension, former smoker. Johnathan [name] had been doing excellently for age until he got COVID in February 2022; he subsequently developed pulmonary fibrosis and is still using oxygen   He was seen by my partner Johnathan [name] back in March with a COPD exacerbation, she treated him with Augmentin and prednisone  His most recent visit with pulmonology was a couple days prior, also in March-they consulted regarding the patient's desire to have cataract surgery COPD with chronic bronchitis and emphysema (HCC) COPD exacerbation in February now appears to be resolved.  Patient appears to be back to baseline.  Will continue on current triple therapy maintenance regimen.   Plan  Patient Instructions  Continue Trelegy 1 puff daily. Brush tongue and rinse mouth afterwards Continue Albuterol inhaler 2 puffs or 3 mL neb every 6 hours as needed for shortness of breath or wheezing. Mucinex As needed  for cough/congestion  Continue Oxygen 2 lpm for goal oxygen >88-90%  Continue loratadine 1 tab daily  Activity as tolerated  Good luck with up coming eye surgery.  Follow up with Dr. Isaiah [name] in 4-6 months and As needed   Chronic respiratory failure with hypoxia (HCC) Continue on oxygen to make demands. Preop pulmonary/respiratory exam Pulmonary preop risk assessment for outpatient cataract surgery.  Patient has underlying severe COPD with emphysema that is oxygen dependent however appears  to be stable.  He has at least a moderate risk from a pulmonary standpoint.  Would avoid general anesthesia if possible.  Continue on oxygen to maintain O2 saturations at goal.  Postop mobility as soon as able.  Reviewed in detail potential pulmonary complications with patient.  Patient is fully independent and continues to drive.  Patient says his vision is getting worse and will lose his independence if not able to have cataract surgery.  Recommend . Short duration of surgery as much as possible and avoid paralytics  . Recovery in step down or ICU with Pulmonary consultation if indicated  . Aggressive pulmonary toilet with o2, bronchodilatation, and incentive spirometry and early ambulation       Lab Results  Component Value Date   HGBA1C 6.6 (H) 11/05/2021   Due for foot exam today, can update A1c He is currently taking metformin 250 once daily Also pravastatin  Patient Active Problem List   Diagnosis Date Noted   Preop pulmonary/respiratory exam 04/09/2022   GERD (gastroesophageal reflux disease) 09/11/2021   Acute on chronic respiratory failure with hypoxia 09/10/2021   Interstitial pulmonary disease 03/19/2021   Hypertension 03/19/2021   Oral candidiasis 03/05/2021   Chronic respiratory failure with hypoxia 03/05/2021   Trigger finger, right ring finger 02/13/2021   COPD with chronic bronchitis and emphysema 04/26/2020   COVID-19 02/25/2020   Shingles 11/14/2019   Raynaud's syndrome 11/14/2019   Skin cancer 11/14/2019   Left inguinal hernia s/p lap repair w mesh 05/06/2019 05/06/2019   Recurrent right inguinal hernia s/p lap  repair w mesh 05/06/2019 03/01/2019   Diabetes mellitus 03/01/2019   CAP (community acquired pneumonia) 04/21/2014   COPD with acute exacerbation (HCC) 04/21/2014    Past Medical History:  Diagnosis Date   Anal fissure    COPD (chronic obstructive pulmonary disease) (HCC)    Diverticulosis    DM type 2 (diabetes mellitus, type 2) (HCC)    GERD  (gastroesophageal reflux disease)    uses prilosec    Hypertension    Pneumonia 2018   Raynaud's disease    Shortness of breath dyspnea    WITH EXERTION   Sinusitis    Tremors of nervous system     Past Surgical History:  Procedure Laterality Date   COLONOSCOPY  03/27/2005   ESOPHAGOGASTRODUODENOSCOPY (EGD) WITH PROPOFOL N/A 02/08/2021   Procedure: ESOPHAGOGASTRODUODENOSCOPY (EGD) WITH PROPOFOL;  Surgeon: Rachael Fee, MD;  Location: [facility];  Service: Endoscopy;  Laterality: N/A;   HERNIA REPAIR     INGUINAL HERNIA REPAIR N/A 05/06/2019   Procedure: LAPAROSCOPIC RIGHT AND LEFT INGUINAL HERNIA(S) REPAIR;  Surgeon: Karie Soda, MD;  Location: [facility];  Service: General;  Laterality: N/A;    Social History   Tobacco Use   Smoking status: Former    Packs/day: 1.00    Years: 45.00    Additional pack years: 0.00    Total pack years: 45.00    Types: Cigarettes    Quit date: 01/21/1994    Years since quitting: 28.2   Smokeless tobacco: Never  Substance Use Topics   Alcohol use: Yes    Comment: a drink daily with dinner   Drug use: No    Family History  Problem Relation Age of Onset   Dementia Brother    Diabetes Brother    Heart attack Father    Dementia Father     No Known Allergies  Medication list has been reviewed and updated.  Current Outpatient Medications on File Prior to Visit  Medication Sig Dispense Refill   albuterol (VENTOLIN HFA) 108 (90 Base) MCG/ACT inhaler Inhale 2 puffs into the lungs every 6 (six) hours as needed for wheezing or shortness of breath. 18 g 2   amLODipine (NORVASC) 5 MG tablet Take 1 tablet (5 mg total) by mouth daily. 90 tablet 1   amoxicillin-clavulanate (AUGMENTIN) 875-125 MG tablet Take 1 tablet by mouth 2 (two) times daily. 20 tablet 0   aspirin EC 81 MG tablet Take 81 mg by mouth every morning.     B Complex-C (SUPER B COMPLEX PO) Take 1 tablet by mouth at bedtime.     benzonatate (TESSALON) 200 MG  capsule Take 1 capsule (200 mg total) by mouth 3 (three) times daily as needed for cough. 20 capsule 0   Cholecalciferol (VITAMIN D3 GUMMIES PO) Take 2 tablets by mouth every morning.     Coenzyme Q10 (COQ10 PO) Take 1 capsule by mouth every morning.     Flaxseed, Linseed, (FLAXSEED OIL) 1200 MG CAPS Take 1 capsule by mouth at bedtime.     Fluticasone-Umeclidin-Vilant (TRELEGY ELLIPTA) 100-62.5-25 MCG/ACT AEPB Inhale 1 puff into the lungs at bedtime.     furosemide (LASIX) 20 MG tablet Take 1 tablet (20 mg total) by mouth daily as needed for edema. 30 tablet 0   Ginkgo Biloba EXTR Take 1 tablet by mouth 2 (two) times daily.     guaiFENesin (MUCINEX) 600 MG 12 hr tablet Take 2 tablets (1,200 mg total) by mouth 2 (two) times daily. (Patient taking differently:  Take 1,200 mg by mouth as needed.) 60 tablet 0   ketoconazole (NIZORAL) 2 % cream Apply 1 Application topically daily. Use as needed for fungal infection 15 g 1   loratadine (CLARITIN) 10 MG tablet TAKE 1 TABLET EVERY DAY (Patient taking differently: Take 10 mg by mouth every morning.) 90 tablet 1   meloxicam (MOBIC) 15 MG tablet TAKE 1 TABLET BY MOUTH DAILY AS  NEEDED FOR JOINT PAIN 90 tablet 3   metFORMIN (GLUCOPHAGE) 500 MG tablet Take 0.5 tablets (250 mg total) by mouth daily. 45 tablet 1   Misc Natural Products (HIMALAYAN GOJI PO) Take 6 Doses by mouth daily. Goji berries     Multiple Vitamin (MULTIVITAMIN WITH MINERALS) TABS tablet Take 1 tablet by mouth every morning.     omeprazole (PRILOSEC) 20 MG capsule TAKE 1 CAPSULE BY MOUTH TWICE  DAILY 180 capsule 1   OXYGEN Inhale 2 L into the lungs continuous.     pravastatin (PRAVACHOL) 40 MG tablet Take 0.5 tablets (20 mg total) by mouth daily. 45 tablet 0   primidone (MYSOLINE) 50 MG tablet TAKE 1 TABLET BY MOUTH TWICE  DAILY 200 tablet 2   sildenafil (VIAGRA) 100 MG tablet Take 0.5-1 tablets (50-100 mg total) by mouth daily as needed for erectile dysfunction. 10 tablet 6   terazosin  (HYTRIN) 1 MG capsule Take 1 capsule (1 mg total) by mouth 2 (two) times daily. 180 capsule 0   Tetrahydrozoline HCl (VISINE OP) Place 1 drop into both eyes 2 (two) times daily.     traMADol (ULTRAM) 50 MG tablet Take 1 tablet (50 mg total) by mouth every 12 (twelve) hours as needed for moderate pain. 30 tablet 1   triamcinolone cream (KENALOG) 0.1 % Apply 1 Application topically 2 (two) times daily as needed (rash/itching).     No current facility-administered medications on file prior to visit.    Review of Systems:  As per HPI- otherwise negative.   Physical Examination: There were no vitals filed for this visit. There were no vitals filed for this visit. There is no height or weight on file to calculate BMI. Ideal Body Weight:    GEN: no acute distress. HEENT: Atraumatic, Normocephalic.  Ears and Nose: No external deformity. CV: RRR, No M/G/R. No JVD. No thrill. No extra heart sounds. PULM: CTA B, no wheezes, crackles, rhonchi. No retractions. No resp. distress. No accessory muscle use. ABD: S, NT, ND, +BS. No rebound. No HSM. EXTR: No c/c/e PSYCH: Normally interactive. Conversant.  Foot exam  Assessment and Plan: ***  Signed Abbe Amsterdam, MD

## 2022-05-06 ENCOUNTER — Ambulatory Visit (INDEPENDENT_AMBULATORY_CARE_PROVIDER_SITE_OTHER): Payer: 59 | Admitting: Family Medicine

## 2022-05-06 ENCOUNTER — Ambulatory Visit (HOSPITAL_BASED_OUTPATIENT_CLINIC_OR_DEPARTMENT_OTHER)
Admission: RE | Admit: 2022-05-06 | Discharge: 2022-05-06 | Disposition: A | Discharge status: Home / Self Care | Payer: 59 | Source: Ambulatory Visit | Attending: Family Medicine | Admitting: Family Medicine

## 2022-05-06 ENCOUNTER — Encounter: Payer: Self-pay | Admitting: Family Medicine

## 2022-05-06 VITALS — BP 142/82 | HR 71 | Temp 97.7°F | Resp 18 | Ht 69.0 in | Wt 147.2 lb

## 2022-05-06 DIAGNOSIS — G8929 Other chronic pain: Secondary | ICD-10-CM

## 2022-05-06 DIAGNOSIS — M545 Low back pain, unspecified: Secondary | ICD-10-CM | POA: Insufficient documentation

## 2022-05-06 DIAGNOSIS — J439 Emphysema, unspecified: Secondary | ICD-10-CM

## 2022-05-06 DIAGNOSIS — E119 Type 2 diabetes mellitus without complications: Secondary | ICD-10-CM

## 2022-05-06 DIAGNOSIS — R2689 Other abnormalities of gait and mobility: Secondary | ICD-10-CM

## 2022-05-06 DIAGNOSIS — I10 Essential (primary) hypertension: Secondary | ICD-10-CM | POA: Diagnosis not present

## 2022-05-06 DIAGNOSIS — J4489 Other specified chronic obstructive pulmonary disease: Secondary | ICD-10-CM | POA: Diagnosis not present

## 2022-05-06 DIAGNOSIS — N529 Male erectile dysfunction, unspecified: Secondary | ICD-10-CM | POA: Diagnosis not present

## 2022-05-06 LAB — CBC
HCT: 38.7 % — ABNORMAL LOW (ref 39.0–52.0)
Hemoglobin: 13.1 g/dL (ref 13.0–17.0)
MCHC: 33.9 g/dL (ref 30.0–36.0)
MCV: 96.2 fl (ref 78.0–100.0)
Platelets: 168 10*3/uL (ref 150.0–400.0)
RBC: 4.02 Mil/uL — ABNORMAL LOW (ref 4.22–5.81)
RDW: 14.1 % (ref 11.5–15.5)
WBC: 4.8 10*3/uL (ref 4.0–10.5)

## 2022-05-06 LAB — COMPREHENSIVE METABOLIC PANEL
ALT: 10 U/L (ref 0–53)
AST: 14 U/L (ref 0–37)
Albumin: 4 g/dL (ref 3.5–5.2)
Alkaline Phosphatase: 82 U/L (ref 39–117)
BUN: 17 mg/dL (ref 6–23)
CO2: 30 mEq/L (ref 19–32)
Calcium: 9.5 mg/dL (ref 8.4–10.5)
Chloride: 102 mEq/L (ref 96–112)
Creatinine, Ser: 1.02 mg/dL (ref 0.40–1.50)
GFR: 66.06 mL/min (ref >60.00)
Glucose, Bld: 95 mg/dL (ref 70–99)
Potassium: 4.9 mEq/L (ref 3.5–5.1)
Sodium: 140 mEq/L (ref 135–145)
Total Bilirubin: 0.3 mg/dL (ref 0.2–1.2)
Total Protein: 7.1 g/dL (ref 6.0–8.3)

## 2022-05-06 LAB — HEMOGLOBIN A1C: Hgb A1c MFr Bld: 6.6 % — ABNORMAL HIGH (ref 4.6–6.5)

## 2022-05-06 MED ORDER — ALBUTEROL SULFATE HFA 108 (90 BASE) MCG/ACT IN AERS
2.0000 | INHALATION_SPRAY | Freq: Four times a day (QID) | RESPIRATORY_TRACT | 2 refills | Status: DC | PRN
Start: 2022-05-06 — End: 2023-06-04

## 2022-05-06 MED ORDER — AMLODIPINE BESYLATE 5 MG PO TABS: 5.0000 mg | ORAL_TABLET | Freq: Every day | ORAL | 3 refills | Status: DC

## 2022-05-08 ENCOUNTER — Other Ambulatory Visit: Payer: Self-pay | Admitting: Family Medicine

## 2022-05-14 ENCOUNTER — Encounter: Payer: Self-pay | Admitting: Family Medicine

## 2022-05-14 DIAGNOSIS — M545 Low back pain, unspecified: Secondary | ICD-10-CM

## 2022-05-15 ENCOUNTER — Ambulatory Visit (INDEPENDENT_AMBULATORY_CARE_PROVIDER_SITE_OTHER): Payer: 59 | Admitting: Urology

## 2022-05-15 ENCOUNTER — Encounter: Payer: Self-pay | Admitting: Urology

## 2022-05-15 VITALS — BP 127/62 | HR 73 | Ht 69.0 in | Wt 145.0 lb

## 2022-05-15 DIAGNOSIS — N529 Male erectile dysfunction, unspecified: Secondary | ICD-10-CM

## 2022-05-15 MED ORDER — TADALAFIL 20 MG PO TABS
ORAL_TABLET | ORAL | 5 refills | Status: DC
Start: 2022-05-15 — End: 2022-09-11

## 2022-05-15 NOTE — Progress Notes (Signed)
Assessment: 1. Erectile dysfunction of organic origin      PLAN: Management of erectile dysfunction discussed at length with patient using a goal-oriented approach.  Following our discussion he would like to try new medical management. Rx: Tadalafil 20 mg as needed Ashby Dawes of medication including proper utilization as well as potential adverse events and side effects reviewed.   Chief Complaint: ED  History of Present Illness:  Johnathan [name] is a 89 y.o. male who is seen in consultation from Copland, Gwenlyn Found, MD for evaluation of ED. Patient has a past medical history of COPD, diabetes, hypertension, former smoker.  Patient also has pulmonary fibrosis and uses oxygen as a result of COVID infection he had in 2022. Patient has longstanding erectile dysfunction for many years.  He previously used sildenafil with good results.  This no longer works well for him.  He states that he gets an approximately one half erection on his own most of the time but is able to get fully erect.    Past Medical History:  Past Medical History:  Diagnosis Date   Anal fissure    COPD (chronic obstructive pulmonary disease) (HCC)    Diverticulosis    DM type 2 (diabetes mellitus, type 2) (HCC)    GERD (gastroesophageal reflux disease)    uses prilosec    Hypertension    Pneumonia 2018   Raynaud's disease    Shortness of breath dyspnea    WITH EXERTION   Sinusitis    Tremors of nervous system     Past Surgical History:  Past Surgical History:  Procedure Laterality Date   COLONOSCOPY  03/27/2005   ESOPHAGOGASTRODUODENOSCOPY (EGD) WITH PROPOFOL N/A 02/08/2021   Procedure: ESOPHAGOGASTRODUODENOSCOPY (EGD) WITH PROPOFOL;  Surgeon: Rachael Fee, MD;  Location: [facility];  Service: Endoscopy;  Laterality: N/A;   HERNIA REPAIR     INGUINAL HERNIA REPAIR N/A 05/06/2019   Procedure: LAPAROSCOPIC RIGHT AND LEFT INGUINAL HERNIA(S) REPAIR;  Surgeon: Karie Soda, MD;  Location: [facility];  Service: General;  Laterality: N/A;    Allergies:  No Known Allergies  Family History:  Family History  Problem Relation Age of Onset   Dementia Brother    Diabetes Brother    Heart attack Father    Dementia Father     Social History:  Social History   Tobacco Use   Smoking status: Former    Packs/day: 1.00    Years: 45.00    Additional pack years: 0.00    Total pack years: 45.00    Types: Cigarettes    Quit date: 01/21/1994    Years since quitting: 28.3   Smokeless tobacco: Never  Substance Use Topics   Alcohol use: Yes    Comment: a drink daily with dinner   Drug use: No    Review of symptoms:  Constitutional:  Negative for unexplained weight loss, night sweats, fever, chills ENT:  Negative for nose bleeds, sinus pain, painful swallowing CV:  Negative for chest pain, shortness of breath, exercise intolerance, palpitations, loss of consciousness Resp:  Negative for cough, wheezing, shortness of breath GI:  Negative for nausea, vomiting, diarrhea, bloody stools GU:  Positives noted in HPI; otherwise negative for gross hematuria, dysuria, urinary incontinence Neuro:  Negative for seizures, poor balance, limb weakness, slurred speech Psych:  Negative for lack of energy, depression, anxiety Endocrine:  Negative for polydipsia, polyuria, symptoms of hypoglycemia (dizziness, hunger, sweating) Hematologic:  Negative for anemia, purpura, petechia, prolonged or excessive bleeding,  use of anticoagulants  Allergic:  Negative for difficulty breathing or choking as a result of exposure to anything; no shellfish allergy; no allergic response (rash/itch) to materials, foods  Physical exam: BP 127/62   Pulse 73   Ht  (1.753 m)   Wt 145 lb (65.8 kg)   BMI 21.41 kg/m  GENERAL APPEARANCE:  Well appearing, well developed, well nourished, NAD

## 2022-05-22 DIAGNOSIS — U071 COVID-19: Secondary | ICD-10-CM | POA: Diagnosis not present

## 2022-05-29 DIAGNOSIS — H2512 Age-related nuclear cataract, left eye: Secondary | ICD-10-CM | POA: Diagnosis not present

## 2022-06-03 ENCOUNTER — Encounter: Payer: Self-pay | Admitting: Physical Therapy

## 2022-06-03 ENCOUNTER — Ambulatory Visit: Payer: 59 | Attending: Family Medicine | Admitting: Physical Therapy

## 2022-06-03 DIAGNOSIS — R2681 Unsteadiness on feet: Secondary | ICD-10-CM | POA: Diagnosis not present

## 2022-06-03 DIAGNOSIS — R2689 Other abnormalities of gait and mobility: Secondary | ICD-10-CM | POA: Diagnosis not present

## 2022-06-03 NOTE — Therapy (Signed)
OUTPATIENT PHYSICAL THERAPY EVALUATION   Patient Name: Johnathan [name] MRN: [id number] DOB:1932/09/16, 89 y.o., male Today's Date: 06/03/2022  END OF SESSION:  PT End of Session - 06/03/22 1039     Visit Number 1    Number of Visits 12    Date for PT Re-Evaluation 07/15/22    Authorization Type [employer] Dual Complete+ Medicaid    Progress Note Due on Visit 10    PT Start Time 1030    PT Stop Time 1100    PT Time Calculation (min) 30 min    Activity Tolerance Patient tolerated treatment well    Behavior During Therapy WFL for tasks assessed/performed             Past Medical History:  Diagnosis Date   Anal fissure    COPD (chronic obstructive pulmonary disease) (HCC)    Diverticulosis    DM type 2 (diabetes mellitus, type 2) (HCC)    GERD (gastroesophageal reflux disease)    uses prilosec    Hypertension    Pneumonia 2018   Raynaud's disease    Shortness of breath dyspnea    WITH EXERTION   Sinusitis    Tremors of nervous system    Past Surgical History:  Procedure Laterality Date   COLONOSCOPY  03/27/2005   ESOPHAGOGASTRODUODENOSCOPY (EGD) WITH PROPOFOL N/A 02/08/2021   Procedure: ESOPHAGOGASTRODUODENOSCOPY (EGD) WITH PROPOFOL;  Surgeon: Rachael Fee, MD;  Location: [facility];  Service: Endoscopy;  Laterality: N/A;   HERNIA REPAIR     INGUINAL HERNIA REPAIR N/A 05/06/2019   Procedure: LAPAROSCOPIC RIGHT AND LEFT INGUINAL HERNIA(S) REPAIR;  Surgeon: Karie Soda, MD;  Location: [facility];  Service: General;  Laterality: N/A;   Patient Active Problem List   Diagnosis Date Noted   Preop pulmonary/respiratory exam 04/09/2022   GERD (gastroesophageal reflux disease) 09/11/2021   Acute on chronic respiratory failure with hypoxia (HCC) 09/10/2021   Interstitial pulmonary disease (HCC) 03/19/2021   Hypertension 03/19/2021   Oral candidiasis 03/05/2021   Chronic respiratory failure with hypoxia (HCC) 03/05/2021   Trigger finger, right ring finger  02/13/2021   COPD with chronic bronchitis and emphysema (HCC) 04/26/2020   COVID-19 02/25/2020   Shingles 11/14/2019   Raynaud's syndrome 11/14/2019   Skin cancer 11/14/2019   Left inguinal hernia s/p lap repair w mesh 05/06/2019 05/06/2019   Recurrent right inguinal hernia s/p lap repair w mesh 05/06/2019 03/01/2019   Diabetes mellitus (HCC) 03/01/2019   CAP (community acquired pneumonia) 04/21/2014    PCP: Pearline Cables, MD  REFERRING PROVIDER: Pearline Cables, MD  REFERRING DIAG: R26.89 (ICD-10-CM) - Balance problem  THERAPY DIAG:  Unsteadiness on feet  Other abnormalities of gait and mobility  Rationale for Evaluation and Treatment: Rehabilitation  ONSET DATE: over last year  SUBJECTIVE:   SUBJECTIVE STATEMENT: I do try to be careful of my balance and keep the wall on one side.   I haven't heard anything about the referral for my back pain.  The back pain has been getting progressively getting worse over the last couple of months, used to be take a tramadol once in a while, but now having to take daily, doesn't want to have to take it all the time.   Haven't had any falls in the last 6 months, did fall a year ago, all of a sudden on the floor, since then have been very careful.   [facility]. It  has not helped by back at all.  Right now doing "executive workouts" - steam room, sauna.  I am going to start using weight machines, I have a program set up but hasn't yet.  I get  tired just [facility].    PERTINENT HISTORY: From MD notes "History of emphysema, diabetes, COPD, hypertension, former smoker. Dylen had been doing excellently for age until he got COVID in February 2022; he subsequently developed pulmonary fibrosis and is still using oxygen"  PAIN:  Are you having pain? Yes: NPRS scale: 0-3/10 Pain location: low back  Pain description: ache Aggravating factors: just happens Relieving factors:  tramadol  PRECAUTIONS: Fall  WEIGHT BEARING RESTRICTIONS: No  FALLS:  Has patient fallen in last 6 months? No  LIVING ENVIRONMENT: Lives with:  lives with roommate Lives in: House/apartment Stairs: No Has following equipment at home:  portable oxygen  OCCUPATION: retired  PLOF: Independent and Leisure: [facility]  PATIENT GOALS: be able to walk comfortable, can only walk short distances now.   NEXT MD VISIT: not scheduled at this time  OBJECTIVE:   DIAGNOSTIC FINDINGS: 06/27/21 DG R hip IMPRESSION: Degenerative changes in the right hip.  09/14/2017 Lumbar MRI IMPRESSION: 1. Diffuse grade 2 anterolisthesis at L5-S1 mild bilateral foraminal narrowing. 2. The most significant central canal stenosis is at L4-5 slight anterolisthesis results in uncovering of a broad-based disc protrusion. Mild subarticular and moderate bilateral foraminal stenosis is present. 3. Mild foraminal narrowing bilaterally at L2-3 secondary to a broad-based disc protrusion.  PATIENT SURVEYS:  ABC scale 67.5%  COGNITION: Overall cognitive status: Within functional limits for tasks assessed     SENSATION: WFL   MUSCLE LENGTH: Hamstrings: NT Quadriceps: NT  POSTURE: No Significant postural limitations  PALPATION: NT  LOWER EXTREMITY ROM: NT  LOWER EXTREMITY MMT:  5/5 bil except hip extension 4+/5   LOWER EXTREMITY SPECIAL TESTS:  NT  FUNCTIONAL TESTS:  5 times sit to stand: 17 sec without UE assist.  2 minute walk test: NT Dynamic Gait Index:  NT MCTSIB: Condition 1: Avg of 3 trials: 30 sec, Condition 2: Avg of 3 trials: 30 sec, Condition 3: Avg of 3 trials: 30 sec, Condition 4: Avg of 3 trials: 30 sec, and Total Score: 120/120  GAIT: Distance walked: 50' Assistive device utilized: None Level of assistance: Complete Independence Comments: portable O2   TODAY'S TREATMENT:                                                                                                                               DATE:      PATIENT EDUCATION:  Education details: findings, POC Person educated: Patient Education method: Explanation Education comprehension: verbalized understanding  HOME EXERCISE PROGRAM: TBD  ASSESSMENT:  CLINICAL IMPRESSION: Johnathan [name]  is an 89 y.o. male who was seen today for physical therapy evaluation and treatment for balance problems.  He reports 1 fall a year  ago and then being very careful since then, trying to stay near wall, and decreased activity tolerance, needing portable O2 since COVID infection.  He is planning on starting [facility] program [facility].  He has been referred to neurosurgeon for chronic LBP.  Today noted good LE strength with exception of hip extensors, but slightly decreased functional strength with 5xSTS of 17 second indicating increased risk of falls.  Plan to further assess gait and balance next session due to limited time from late arrival today.  BERTIS DIEGO would benefit from skilled physical therapy services to help reach the maximal level of functional independence and mobility and decrease risk of falls.       OBJECTIVE IMPAIRMENTS: Abnormal gait, decreased activity tolerance, decreased balance, decreased endurance, decreased mobility, difficulty walking, decreased strength, decreased safety awareness, and impaired perceived functional ability.   ACTIVITY LIMITATIONS: stairs, transfers, and locomotion level  PARTICIPATION LIMITATIONS: meal prep, cleaning, laundry, and community activity  PERSONAL FACTORS: Age and 3+ comorbidities: emphysema, diabetes, COPD, hypertension, former smoker  are also affecting patient's functional outcome.   REHAB POTENTIAL: Good  CLINICAL DECISION MAKING: Evolving/moderate complexity  EVALUATION COMPLEXITY: Moderate   GOALS: Goals reviewed with patient? Yes  SHORT TERM GOALS: Target date: 06/24/2022   Patient will be independent with initial HEP. Baseline:  Goal status:  INITIAL  Patient will complete further balance assessments including DGI and BERG. Baseline:  Goal status: INITIAL  LONG TERM GOALS: Target date: 07/15/2022   Patient will be independent with advanced/ongoing HEP to improve outcomes and carryover.  Baseline:  Goal status: INITIAL  2.  Patient will be able to ambulate 600' with LRAD with good safety to access community.  Baseline: NT Goal status: INITIAL  3.  Patient will be able to step up/down curb safely with LRAD for safety with community ambulation.  Baseline: NT Goal status: INITIAL   4.  Patient will demonstrate improved functional LE strength as demonstrated by 5x STS < 15 sec . Baseline: 17 seconds Goal status: INITIAL  5.  Patient will demonstrate at least 19/24 on DGI to improve gait stability and reduce risk for falls. Baseline: NT Goal status: INITIAL  6.  Patient will score >49/56 on Berg Balance test to demonstrate safety without assistive device Baseline: NT Goal status: INITIAL  7.  Patient will report 80% or better on ABC scale to demonstrate improved functional ability. Baseline: 67.5% Goal status: INITIAL    PLAN:  PT FREQUENCY: 1-2x/week  PT DURATION: 6 weeks  PLANNED INTERVENTIONS: Therapeutic exercises, Therapeutic activity, Neuromuscular re-education, Balance training, Gait training, Patient/Family education, Self Care, Joint mobilization, Stair training, Orthotic/Fit training, Moist heat, Manual therapy, and Re-evaluation  PLAN FOR NEXT SESSION: BERG, DGI, info on the Breather   Jena Gauss, PT, DPT  06/03/2022, 12:20 PM

## 2022-06-11 ENCOUNTER — Ambulatory Visit: Payer: 59 | Admitting: Physical Therapy

## 2022-06-11 ENCOUNTER — Encounter: Payer: Self-pay | Admitting: Physical Therapy

## 2022-06-11 DIAGNOSIS — R2689 Other abnormalities of gait and mobility: Secondary | ICD-10-CM

## 2022-06-11 DIAGNOSIS — R2681 Unsteadiness on feet: Secondary | ICD-10-CM | POA: Diagnosis not present

## 2022-06-11 NOTE — Therapy (Signed)
OUTPATIENT PHYSICAL THERAPY TREATMENT   Patient Name: Johnathan [name] MRN: [id number] DOB:04-24-1932, 89 y.o., male Today's Date: 06/11/2022  END OF SESSION:  PT End of Session - 06/11/22 1024     Visit Number 2    Number of Visits 12    Date for PT Re-Evaluation 07/15/22    Authorization Type [employer]HC Dual Complete+ Medicaid    Progress Note Due on Visit 10    PT Start Time 1020    PT Stop Time 1103    PT Time Calculation (min) 43 min    Activity Tolerance Patient tolerated treatment well    Behavior During Therapy WFL for tasks assessed/performed             Past Medical History:  Diagnosis Date   Anal fissure    COPD (chronic obstructive pulmonary disease) (HCC)    Diverticulosis    DM type 2 (diabetes mellitus, type 2) (HCC)    GERD (gastroesophageal reflux disease)    uses prilosec    Hypertension    Pneumonia 2018   Raynaud's disease    Shortness of breath dyspnea    WITH EXERTION   Sinusitis    Tremors of nervous system    Past Surgical History:  Procedure Laterality Date   COLONOSCOPY  03/27/2005   ESOPHAGOGASTRODUODENOSCOPY (EGD) WITH PROPOFOL N/A 02/08/2021   Procedure: ESOPHAGOGASTRODUODENOSCOPY (EGD) WITH PROPOFOL;  Surgeon: Rachael Fee, MD;  Location: [facility];  Service: Endoscopy;  Laterality: N/A;   HERNIA REPAIR     INGUINAL HERNIA REPAIR N/A 05/06/2019   Procedure: LAPAROSCOPIC RIGHT AND LEFT INGUINAL HERNIA(S) REPAIR;  Surgeon: Karie Soda, MD;  Location: [facility] ;  Service: General;  Laterality: N/A;   Patient Active Problem List   Diagnosis Date Noted   Preop pulmonary/respiratory exam 04/09/2022   GERD (gastroesophageal reflux disease) 09/11/2021   Acute on chronic respiratory failure with hypoxia (HCC) 09/10/2021   Interstitial pulmonary disease (HCC) 03/19/2021   Hypertension 03/19/2021   Oral candidiasis 03/05/2021   Chronic respiratory failure with hypoxia (HCC) 03/05/2021   Trigger finger, right ring finger  02/13/2021   COPD with chronic bronchitis and emphysema (HCC) 04/26/2020   COVID-19 02/25/2020   Shingles 11/14/2019   Raynaud's syndrome 11/14/2019   Skin cancer 11/14/2019   Left inguinal hernia s/p lap repair w mesh 05/06/2019 05/06/2019   Recurrent right inguinal hernia s/p lap repair w mesh 05/06/2019 03/01/2019   Diabetes mellitus (HCC) 03/01/2019   CAP (community acquired pneumonia) 04/21/2014    PCP: Pearline Cables, MD  REFERRING PROVIDER: Pearline Cables, MD  REFERRING DIAG: R26.89 (ICD-10-CM) - Balance problem  THERAPY DIAG:  Unsteadiness on feet  Other abnormalities of gait and mobility  Rationale for Evaluation and Treatment: Rehabilitation  ONSET DATE: over last year  SUBJECTIVE:   SUBJECTIVE STATEMENT: Walking up about wore me out today.   No back pain today.  No new falls.  [facility], too [facility].  Still going to stretching lab, doesn' see that it is doing any good.   From Eval: I do try to be careful of my balance and keep the wall on one side.   I haven't heard anything about the referral for my back pain.  The back pain has been getting progressively getting worse over the last couple of months, used to be take a tramadol once in a while, but now having to take daily, doesn't want to have to take it all the  time.   Haven't had any falls in the last 6 months, did fall a year ago, all of a sudden on the floor, since then have been very careful.   [facility] and also [facility]. It has not helped by back at all.  Right now doing "executive workouts" - steam room, sauna.  I am going to start using weight machines, I have a program set up but hasn't yet.  I get  tired just [facility] to [facility].    PERTINENT HISTORY: From MD notes "History of emphysema, diabetes, COPD, hypertension, former smoker. Tymar had been doing excellently for age until he got COVID in February 2022; he subsequently developed  pulmonary fibrosis and is still using oxygen"  PAIN:  Are you having pain? Yes: NPRS scale: 0/10 Pain location: low back  Pain description: ache Aggravating factors: just happens Relieving factors: tramadol  PRECAUTIONS: Fall  WEIGHT BEARING RESTRICTIONS: No  FALLS:  Has patient fallen in last 6 months? No  LIVING ENVIRONMENT: Lives with:  lives with roommate Lives in: House/apartment Stairs: No Has following equipment at home:  portable oxygen  OCCUPATION: retired  PLOF: Independent and Leisure: [facility] to [facility]  PATIENT GOALS: be able to walk comfortable, can only walk short distances now.   NEXT MD VISIT: not scheduled at this time  OBJECTIVE:   DIAGNOSTIC FINDINGS: 06/27/21 DG R hip IMPRESSION: Degenerative changes in the right hip.  09/14/2017 Lumbar MRI IMPRESSION: 1. Diffuse grade 2 anterolisthesis at L5-S1 mild bilateral foraminal narrowing. 2. The most significant central canal stenosis is at L4-5 slight anterolisthesis results in uncovering of a broad-based disc protrusion. Mild subarticular and moderate bilateral foraminal stenosis is present. 3. Mild foraminal narrowing bilaterally at L2-3 secondary to a broad-based disc protrusion.  PATIENT SURVEYS:  ABC scale 67.5%  COGNITION: Overall cognitive status: Within functional limits for tasks assessed     SENSATION: WFL   MUSCLE LENGTH: Hamstrings: NT Quadriceps: NT  POSTURE: No Significant postural limitations  PALPATION: NT  LOWER EXTREMITY ROM: NT  LOWER EXTREMITY MMT:  5/5 bil except hip extension 4+/5   LOWER EXTREMITY SPECIAL TESTS:  NT  FUNCTIONAL TESTS:  5 times sit to stand: 17 sec without UE assist.  2 minute walk test: NT Dynamic Gait Index:  NT MCTSIB: Condition 1: Avg of 3 trials: 30 sec, Condition 2: Avg of 3 trials: 30 sec, Condition 3: Avg of 3 trials: 30 sec, Condition 4: Avg of 3 trials: 30 sec, and Total Score: 120/120  GAIT: Distance walked: 50' Assistive device  utilized: None Level of assistance: Complete Independence Comments: portable O2   [facility] PT Assessment - 06/11/22 0001       Balance   Balance Assessed Yes      Standardized Balance Assessment   Standardized Balance Assessment Berg Balance Test;Dynamic Gait Index      Berg Balance Test   Sit to Stand Able to stand without using hands and stabilize independently    Standing Unsupported Able to stand safely 2 minutes    Sitting with Back Unsupported but Feet Supported on Floor or Stool Able to sit safely and securely 2 minutes    Stand to Sit Sits safely with minimal use of hands    Transfers Able to transfer safely, minor use of hands    Standing Unsupported with Eyes Closed Able to stand 10 seconds safely    Standing Unsupported with Feet Together Able to place feet together  independently and stand 1 minute safely    From Standing, Reach Forward with Outstretched Arm Can reach forward >12 cm safely (5")    From Standing Position, Pick up Object from Floor Able to pick up shoe safely and easily    From Standing Position, Turn to Look Behind Over each Shoulder Turn sideways only but maintains balance    Turn 360 Degrees Able to turn 360 degrees safely one side only in 4 seconds or less    Standing Unsupported, Alternately Place Feet on Step/Stool Able to stand independently and complete 8 steps >20 seconds    Standing Unsupported, One Foot in Front Able to place foot tandem independently and hold 30 seconds    Standing on One Leg Able to lift leg independently and hold equal to or more than 3 seconds    Total Score 49    Berg comment: moderate risk for falls      Dynamic Gait Index   Level Surface Mild Impairment    Change in Gait Speed Mild Impairment    Gait with Horizontal Head Turns Mild Impairment    Gait with Vertical Head Turns Normal    Gait and Pivot Turn Mild Impairment    Step Over Obstacle Mild Impairment    Step Around Obstacles Normal    Steps Moderate Impairment     Total Score 17              TODAY'S TREATMENT:                                                                                                                              DATE:     06/11/22 Therapeutic Activity:  further assessment of gait and balance.  Bike L1 x 5 min  DGI 17/24 BERG 49/56   PATIENT EDUCATION:  Education details: HEP for SLS, information on Breather (declined), [facility].  Person educated: Patient Education method: Explanation Education comprehension: verbalized understanding  HOME EXERCISE PROGRAM: Access Code: [email or web address] URL: [email or web address] Date: 06/11/2022 Prepared by: Johnathan [name]  Exercises - Standing Single Leg Stance with Counter Support  - 1 x daily - 7 x weekly - 1 sets - 3 reps - 30 sec  hold  ASSESSMENT:  CLINICAL IMPRESSION: Johnathan [name] completed DGI (17/24) and Johnathan [name] (49/56) today, both indicating increased risk of falls.  However he was able to maintain tandem stance x 30 sec bil.  Discussed the Breather but he has several devices at home that he does not use and was not interested as he is "too lazy."  We discussed that a strengthening program like [facility] is [facility] would be ideal as his balance deficits are mostly due to decreased strength and endurance, he needs to motivate himself to start.  Agreed to start working on single leg stance exercise at counter while making food in kitchen, as this would not take time out of his  day for exercise.    ILIYAN MOULD would benefit from skilled physical therapy services to help reach the maximal level of functional independence and mobility and decrease risk of falls.       OBJECTIVE IMPAIRMENTS: Abnormal gait, decreased activity tolerance, decreased balance, decreased endurance, decreased mobility, difficulty walking, decreased strength, decreased safety awareness, and impaired perceived functional ability.   ACTIVITY LIMITATIONS: stairs,  transfers, and locomotion level  PARTICIPATION LIMITATIONS: meal prep, cleaning, laundry, and community activity  PERSONAL FACTORS: Age and 3+ comorbidities: emphysema, diabetes, COPD, hypertension, former smoker  are also affecting patient's functional outcome.   REHAB POTENTIAL: Good  CLINICAL DECISION MAKING: Evolving/moderate complexity  EVALUATION COMPLEXITY: Moderate   GOALS: Goals reviewed with patient? Yes  SHORT TERM GOALS: Target date: 06/24/2022   Patient will be independent with initial HEP. Baseline:  Goal status: IN PROGRESS  Patient will complete further balance assessments including DGI and BERG. Baseline:  Goal status: MET 06/11/22  LONG TERM GOALS: Target date: 07/15/2022   Patient will be independent with advanced/ongoing HEP to improve outcomes and carryover.  Baseline:  Goal status: IN PROGRESS  2.  Patient will be able to ambulate 600' with LRAD with good safety to access community.  Baseline: NT Goal status: IN PROGRESS  3.  Patient will be able to step up/down curb safely with LRAD for safety with community ambulation.  Baseline: NT Goal status: IN PROGRESS   4.  Patient will demonstrate improved functional LE strength as demonstrated by 5x STS < 15 sec . Baseline: 17 seconds Goal status: IN PROGRESS  5.  Patient will demonstrate at least 19/24 on DGI to improve gait stability and reduce risk for falls. Baseline: 06/11/22- 17/24 Goal status: IN PROGRESS  6.  Patient will score >49/56 on Berg Balance test to demonstrate safety without assistive device Baseline: 49/56 Goal status: IN PROGRESS  7.  Patient will report 80% or better on ABC scale to demonstrate improved functional ability. Baseline: 67.5% Goal status: IN PROGRESS    PLAN:  PT FREQUENCY: 1-2x/week  PT DURATION: 6 weeks  PLANNED INTERVENTIONS: Therapeutic exercises, Therapeutic activity, Neuromuscular re-education, Balance training, Gait training, Patient/Family education,  Self Care, Joint mobilization, Stair training, Orthotic/Fit training, Moist heat, Manual therapy, and Re-evaluation  PLAN FOR NEXT SESSION: , higher level balance exercises, review strength training equipment ([facility])   Johnathan [name], PT, DPT  06/11/2022, 12:12 PM

## 2022-06-14 ENCOUNTER — Encounter: Payer: Self-pay | Admitting: Physical Therapy

## 2022-06-14 ENCOUNTER — Ambulatory Visit: Payer: 59 | Admitting: Physical Therapy

## 2022-06-14 DIAGNOSIS — R2681 Unsteadiness on feet: Secondary | ICD-10-CM | POA: Diagnosis not present

## 2022-06-14 DIAGNOSIS — R2689 Other abnormalities of gait and mobility: Secondary | ICD-10-CM | POA: Diagnosis not present

## 2022-06-14 NOTE — Therapy (Signed)
OUTPATIENT PHYSICAL THERAPY TREATMENT   Patient Name: Johnathan [name] MRN: [id number] DOB:12-31-1932, 89 y.o., male Today's Date: 06/14/2022  END OF SESSION:  PT End of Session - 06/14/22 1021     Visit Number 3    Number of Visits 12    Date for PT Re-Evaluation 07/15/22    Authorization Type [employer] Dual Complete+ Medicaid    Progress Note Due on Visit 10    PT Start Time 1021    PT Stop Time 1105    PT Time Calculation (min) 44 min    Activity Tolerance Patient tolerated treatment well    Behavior During Therapy WFL for tasks assessed/performed              Past Medical History:  Diagnosis Date   Anal fissure    COPD (chronic obstructive pulmonary disease) (HCC)    Diverticulosis    DM type 2 (diabetes mellitus, type 2) (HCC)    GERD (gastroesophageal reflux disease)    uses prilosec    Hypertension    Pneumonia 2018   Raynaud's disease    Shortness of breath dyspnea    WITH EXERTION   Sinusitis    Tremors of nervous system    Past Surgical History:  Procedure Laterality Date   COLONOSCOPY  03/27/2005   ESOPHAGOGASTRODUODENOSCOPY (EGD) WITH PROPOFOL N/A 02/08/2021   Procedure: ESOPHAGOGASTRODUODENOSCOPY (EGD) WITH PROPOFOL;  Surgeon: Rachael Fee, MD;  Location: [facility];  Service: Endoscopy;  Laterality: N/A;   HERNIA REPAIR     INGUINAL HERNIA REPAIR N/A 05/06/2019   Procedure: LAPAROSCOPIC RIGHT AND LEFT INGUINAL HERNIA(S) REPAIR;  Surgeon: Karie Soda, MD;  Location: [facility];  Service: General;  Laterality: N/A;   Patient Active Problem List   Diagnosis Date Noted   Preop pulmonary/respiratory exam 04/09/2022   GERD (gastroesophageal reflux disease) 09/11/2021   Acute on chronic respiratory failure with hypoxia (HCC) 09/10/2021   Interstitial pulmonary disease (HCC) 03/19/2021   Hypertension 03/19/2021   Oral candidiasis 03/05/2021   Chronic respiratory failure with hypoxia (HCC) 03/05/2021   Trigger finger, right ring finger  02/13/2021   COPD with chronic bronchitis and emphysema (HCC) 04/26/2020   COVID-19 02/25/2020   Shingles 11/14/2019   Raynaud's syndrome 11/14/2019   Skin cancer 11/14/2019   Left inguinal hernia s/p lap repair w mesh 05/06/2019 05/06/2019   Recurrent right inguinal hernia s/p lap repair w mesh 05/06/2019 03/01/2019   Diabetes mellitus (HCC) 03/01/2019   CAP (community acquired pneumonia) 04/21/2014    PCP: Johnathan Cables, MD  REFERRING PROVIDER: Pearline Cables, MD  REFERRING DIAG: R26.89 (ICD-10-CM) - Balance problem  THERAPY DIAG:  Unsteadiness on feet  Other abnormalities of gait and mobility  RATIONALE FOR EVALUATION AND TREATMENT: Rehabilitation  ONSET DATE: over last year  NEXT MD VISIT: 10/10/22   SUBJECTIVE:   SUBJECTIVE STATEMENT: Pt brought one of his personal respiratory muscle trainers today for PT to look at.  From Eval: I do try to be careful of my balance and keep the wall on one side.   I haven't heard anything about the referral for my back pain.  The back pain has been getting progressively getting worse over the last couple of months, used to be take a tramadol once in a while, but now having to take daily, doesn't want to have to take it all the time.   Haven't had any falls in the last 6 months, did fall a year ago, all of a sudden  on the floor, since then have been very careful.   [facility]. It has not helped by back at all.  Right now doing "executive workouts" - steam room, sauna.  I am going to start using weight machines, I have a program set up but hasn't yet.  I get  tired just [facility] to [facility].    PERTINENT HISTORY: From MD notes "History of emphysema, diabetes, COPD, hypertension, former smoker. Johnathan [name] had been doing excellently for age until he got COVID in February 2022; he subsequently developed pulmonary fibrosis and is still using oxygen"  PAIN:  Are you having pain? Yes: NPRS scale:  0/10 Pain location: low back  Pain description: ache Aggravating factors: just happens Relieving factors: tramadol  PRECAUTIONS: Fall  WEIGHT BEARING RESTRICTIONS: No  FALLS:  Has patient fallen in last 6 months? No  LIVING ENVIRONMENT: Lives with:  lives with roommate Lives in: House/apartment Stairs: No Has following equipment at home:  portable oxygen  OCCUPATION: retired  PLOF: Independent and Leisure: [facility]  PATIENT GOALS: be able to walk comfortable, can only walk short distances now.    OBJECTIVE:   DIAGNOSTIC FINDINGS: 06/27/21 DG R hip IMPRESSION: Degenerative changes in the right hip.  09/14/2017 Lumbar MRI IMPRESSION: 1. Diffuse grade 2 anterolisthesis at L5-S1 mild bilateral foraminal narrowing. 2. The most significant central canal stenosis is at L4-5 slight anterolisthesis results in uncovering of a broad-based disc protrusion. Mild subarticular and moderate bilateral foraminal stenosis is present. 3. Mild foraminal narrowing bilaterally at L2-3 secondary to a broad-based disc protrusion.  PATIENT SURVEYS:  ABC scale 67.5%  COGNITION: Overall cognitive status: Within functional limits for tasks assessed     SENSATION: WFL   MUSCLE LENGTH: Hamstrings: NT Quadriceps: NT  POSTURE: No Significant postural limitations  PALPATION: NT  LOWER EXTREMITY ROM: NT  LOWER EXTREMITY MMT:  5/5 bil except hip extension 4+/5   LOWER EXTREMITY SPECIAL TESTS:  NT  FUNCTIONAL TESTS:  5 times sit to stand: 17 sec without UE assist.  2 minute walk test: NT Dynamic Gait Index:  NT MCTSIB: Condition 1: Avg of 3 trials: 30 sec, Condition 2: Avg of 3 trials: 30 sec, Condition 3: Avg of 3 trials: 30 sec, Condition 4: Avg of 3 trials: 30 sec, and Total Score: 120/120 6 minute walk test:  [facility] PT Assessment - 06/14/22 0001     6 Minute Walk- Baseline   BP (mmHg) 118/56    HR (bpm) 78    02 Sat (%RA) 96 %    Modified Borg Scale for Dyspnea 1- Very mild  shortness of breath   Perceived Rate of Exertion (Borg) 6-      6 Minute walk- Post Test   BP (mmHg) 124/60    HR (bpm) 94    02 Sat (%RA) 89 %    Modified Borg Scale for Dyspnea 5- Strong or hard breathing    Perceived Rate of Exertion (Borg) 13- Somewhat hard      6 minute walk test results    Aerobic Endurance Distance Walked 781    Endurance additional comments 1 seated rest break at 2:50 lasting ~40 sec   GAIT: Distance walked: 1' Assistive device utilized: None Level of assistance: Complete Independence Comments: portable O2   TODAY'S TREATMENT:  DATE:     06/14/22 THERAPEUTIC ACTIVITIES: 6 Minute Walk Test:  6 Minute Walk- Baseline   BP (mmHg) 118/56    HR (bpm) 78    02 Sat (%RA) 96 %    Modified Borg Scale for Dyspnea 1- Very mild shortness of breath   Perceived Rate of Exertion (Borg) 6-      6 Minute walk- Post Test   BP (mmHg) 124/60    HR (bpm) 94    02 Sat (%RA) 89 %    Modified Borg Scale for Dyspnea 5- Strong or hard breathing    Perceived Rate of Exertion (Borg) 13- Somewhat hard      6 minute walk test results    Aerobic Endurance Distance Walked 781    Endurance additional comments 1 seated rest break at 2:50 lasting ~40 sec   Training in use pt's personal respiratory muscle trainer  THERAPEUTIC EXERCISE: to improve flexibility, strength and mobility.  Verbal and tactile cues throughout for technique. BATCA leg press - B LE 25# 3 x 10 BATCA knee flexion - B LE 25# 3 x 10 BATCA knee extension - B LE 20# x 10, 15# x 5 (discontinued d/t c/o increased knee pain)   06/11/22  Therapeutic Activity:  further assessment of gait and balance.  Bike L1 x 5 min  DGI 17/24 BERG 49/56   PATIENT EDUCATION:  Education details:  training in use of personal respiratory muscle trainer and LE machine strengthening at gym Person  educated: Patient Education method: Medical illustrator Education comprehension: verbalized understanding and returned demonstration  HOME EXERCISE PROGRAM: Access Code: [email or web address] URL: [email or web address] Date: 06/11/2022 Prepared by: Harrie Foreman  Exercises - Standing Single Leg Stance with Counter Support  - 1 x daily - 7 x weekly - 1 sets - 3 reps - 30 sec  hold  ASSESSMENT:  CLINICAL IMPRESSION: Azarian completed the 6 minute walk test with moderate levels of dyspnea and perceived exertion reported despite seated rest break, as well as drop in O2 sats to 89% which recovered to baseline in <2 minutes. He brought one of his personal respiratory muscle trainer and we reviewed proper use and adjustment of resistance, encouraging pt to complete 2-3 sets of 10 breaths, 2x/day.  Remainder of session focused on instruction in proper use of gym weight machine for strengthening, focusing on LEs today and will go over upper body on next visit. Devraj will continue to benefit from skilled PT to address his strength, balance and activity tolerance deficits to improve mobility and activity tolerance with decreased risk for falls.   OBJECTIVE IMPAIRMENTS: Abnormal gait, decreased activity tolerance, decreased balance, decreased endurance, decreased mobility, difficulty walking, decreased strength, decreased safety awareness, and impaired perceived functional ability.   ACTIVITY LIMITATIONS: stairs, transfers, and locomotion level  PARTICIPATION LIMITATIONS: meal prep, cleaning, laundry, and community activity  PERSONAL FACTORS: Age and 3+ comorbidities: emphysema, diabetes, COPD, hypertension, former smoker  are also affecting patient's functional outcome.   REHAB POTENTIAL: Good  CLINICAL DECISION MAKING: Evolving/moderate complexity  EVALUATION COMPLEXITY: Moderate   GOALS: Goals reviewed with patient? Yes  SHORT TERM GOALS: Target date: 06/24/2022   Patient will  be independent with initial HEP. Baseline:  Goal status: IN PROGRESS  Patient will complete further balance assessments including DGI and BERG. Baseline:  Goal status: MET 06/11/22  LONG TERM GOALS: Target date: 07/15/2022   Patient will be independent with advanced/ongoing HEP to improve outcomes and carryover.  Baseline:  Goal status: IN PROGRESS  2.  Patient will be able to ambulate 600' with LRAD with good safety to access community.  Baseline: NT Goal status: IN PROGRESS  3.  Patient will be able to step up/down curb safely with LRAD for safety with community ambulation.  Baseline: NT Goal status: IN PROGRESS   4.  Patient will demonstrate improved functional LE strength as demonstrated by 5x STS < 15 sec . Baseline: 17 seconds Goal status: IN PROGRESS  5.  Patient will demonstrate at least 19/24 on DGI to improve gait stability and reduce risk for falls. Baseline: 06/11/22- 17/24 Goal status: IN PROGRESS  6.  Patient will score >49/56 on Berg Balance test to demonstrate safety without assistive device Baseline: 49/56 Goal status: IN PROGRESS  7.  Patient will report 80% or better on ABC scale to demonstrate improved functional ability. Baseline: 67.5% Goal status: IN PROGRESS   PLAN:  PT FREQUENCY: 1-2x/week  PT DURATION: 6 weeks  PLANNED INTERVENTIONS: Therapeutic exercises, Therapeutic activity, Neuromuscular re-education, Balance training, Gait training, Patient/Family education, Self Care, Joint mobilization, Stair training, Orthotic/Fit training, Moist heat, Manual therapy, and Re-evaluation  PLAN FOR NEXT SESSION: higher level balance exercises, review upper body strength training equipment ([facility])   Marry Guan, PT 06/14/2022, 11:34 AM

## 2022-06-18 ENCOUNTER — Ambulatory Visit: Payer: 59 | Admitting: Physical Therapy

## 2022-06-18 ENCOUNTER — Ambulatory Visit: Payer: 59 | Admitting: Family Medicine

## 2022-06-18 ENCOUNTER — Other Ambulatory Visit: Payer: Self-pay

## 2022-06-18 ENCOUNTER — Inpatient Hospital Stay (HOSPITAL_BASED_OUTPATIENT_CLINIC_OR_DEPARTMENT_OTHER)
Admission: EM | Admit: 2022-06-18 | Discharge: 2022-06-24 | DRG: 871 | Disposition: A | Discharge status: Home / Self Care | Payer: 59 | Attending: Family Medicine | Admitting: Family Medicine

## 2022-06-18 ENCOUNTER — Emergency Department (HOSPITAL_BASED_OUTPATIENT_CLINIC_OR_DEPARTMENT_OTHER): Payer: 59

## 2022-06-18 ENCOUNTER — Encounter (HOSPITAL_BASED_OUTPATIENT_CLINIC_OR_DEPARTMENT_OTHER): Payer: Self-pay | Admitting: Emergency Medicine

## 2022-06-18 DIAGNOSIS — J9621 Acute and chronic respiratory failure with hypoxia: Secondary | ICD-10-CM | POA: Diagnosis not present

## 2022-06-18 DIAGNOSIS — Z8249 Family history of ischemic heart disease and other diseases of the circulatory system: Secondary | ICD-10-CM

## 2022-06-18 DIAGNOSIS — I472 Ventricular tachycardia, unspecified: Secondary | ICD-10-CM | POA: Diagnosis not present

## 2022-06-18 DIAGNOSIS — K219 Gastro-esophageal reflux disease without esophagitis: Secondary | ICD-10-CM | POA: Diagnosis present

## 2022-06-18 DIAGNOSIS — E119 Type 2 diabetes mellitus without complications: Secondary | ICD-10-CM

## 2022-06-18 DIAGNOSIS — Z87891 Personal history of nicotine dependence: Secondary | ICD-10-CM | POA: Diagnosis not present

## 2022-06-18 DIAGNOSIS — J441 Chronic obstructive pulmonary disease with (acute) exacerbation: Secondary | ICD-10-CM | POA: Diagnosis present

## 2022-06-18 DIAGNOSIS — Z66 Do not resuscitate: Secondary | ICD-10-CM | POA: Diagnosis not present

## 2022-06-18 DIAGNOSIS — Z79899 Other long term (current) drug therapy: Secondary | ICD-10-CM | POA: Diagnosis not present

## 2022-06-18 DIAGNOSIS — E1165 Type 2 diabetes mellitus with hyperglycemia: Secondary | ICD-10-CM | POA: Diagnosis not present

## 2022-06-18 DIAGNOSIS — A419 Sepsis, unspecified organism: Secondary | ICD-10-CM | POA: Diagnosis not present

## 2022-06-18 DIAGNOSIS — Z1152 Encounter for screening for COVID-19: Secondary | ICD-10-CM

## 2022-06-18 DIAGNOSIS — G250 Essential tremor: Secondary | ICD-10-CM | POA: Diagnosis not present

## 2022-06-18 DIAGNOSIS — E872 Acidosis, unspecified: Secondary | ICD-10-CM | POA: Diagnosis not present

## 2022-06-18 DIAGNOSIS — I152 Hypertension secondary to endocrine disorders: Secondary | ICD-10-CM | POA: Diagnosis not present

## 2022-06-18 DIAGNOSIS — U071 COVID-19: Secondary | ICD-10-CM | POA: Diagnosis not present

## 2022-06-18 DIAGNOSIS — Z833 Family history of diabetes mellitus: Secondary | ICD-10-CM | POA: Diagnosis not present

## 2022-06-18 DIAGNOSIS — J189 Pneumonia, unspecified organism: Secondary | ICD-10-CM | POA: Diagnosis present

## 2022-06-18 DIAGNOSIS — R6520 Severe sepsis without septic shock: Secondary | ICD-10-CM | POA: Diagnosis not present

## 2022-06-18 DIAGNOSIS — R918 Other nonspecific abnormal finding of lung field: Secondary | ICD-10-CM | POA: Diagnosis not present

## 2022-06-18 DIAGNOSIS — B9789 Other viral agents as the cause of diseases classified elsewhere: Secondary | ICD-10-CM | POA: Diagnosis present

## 2022-06-18 DIAGNOSIS — R059 Cough, unspecified: Secondary | ICD-10-CM | POA: Diagnosis not present

## 2022-06-18 DIAGNOSIS — Z7982 Long term (current) use of aspirin: Secondary | ICD-10-CM | POA: Diagnosis not present

## 2022-06-18 DIAGNOSIS — E1159 Type 2 diabetes mellitus with other circulatory complications: Secondary | ICD-10-CM | POA: Diagnosis present

## 2022-06-18 DIAGNOSIS — J440 Chronic obstructive pulmonary disease with acute lower respiratory infection: Secondary | ICD-10-CM | POA: Diagnosis present

## 2022-06-18 DIAGNOSIS — E785 Hyperlipidemia, unspecified: Secondary | ICD-10-CM | POA: Diagnosis present

## 2022-06-18 DIAGNOSIS — R0602 Shortness of breath: Secondary | ICD-10-CM | POA: Diagnosis not present

## 2022-06-18 DIAGNOSIS — E1169 Type 2 diabetes mellitus with other specified complication: Secondary | ICD-10-CM | POA: Diagnosis not present

## 2022-06-18 LAB — COMPREHENSIVE METABOLIC PANEL
ALT: 15 U/L (ref 0–44)
AST: 24 U/L (ref 15–41)
Albumin: 4.2 g/dL (ref 3.5–5.0)
Alkaline Phosphatase: 92 U/L (ref 38–126)
Anion gap: 12 (ref 5–15)
BUN: 15 mg/dL (ref 8–23)
CO2: 25 mmol/L (ref 22–32)
Calcium: 9.2 mg/dL (ref 8.9–10.3)
Chloride: 98 mmol/L (ref 98–111)
Creatinine, Ser: 1.02 mg/dL (ref 0.61–1.24)
GFR, Estimated: >60 mL/min (ref >60)
Glucose, Bld: 141 mg/dL — ABNORMAL HIGH (ref 70–99)
Potassium: 4.4 mmol/L (ref 3.5–5.1)
Sodium: 135 mmol/L (ref 135–145)
Total Bilirubin: 0.8 mg/dL (ref 0.3–1.2)
Total Protein: 8.5 g/dL — ABNORMAL HIGH (ref 6.5–8.1)

## 2022-06-18 LAB — CBC WITH DIFFERENTIAL/PLATELET
Abs Immature Granulocytes: 0.03 10*3/uL (ref 0.00–0.07)
Basophils Absolute: 0 10*3/uL (ref 0.0–0.1)
Basophils Relative: 0 %
Eosinophils Absolute: 0.2 10*3/uL (ref 0.0–0.5)
Eosinophils Relative: 2 %
HCT: 43.5 % (ref 39.0–52.0)
Hemoglobin: 14.4 g/dL (ref 13.0–17.0)
Immature Granulocytes: 0 %
Lymphocytes Relative: 10 %
Lymphs Abs: 1 10*3/uL (ref 0.7–4.0)
MCH: 31.7 pg (ref 26.0–34.0)
MCHC: 33.1 g/dL (ref 30.0–36.0)
MCV: 95.8 fL (ref 80.0–100.0)
Monocytes Absolute: 0.7 10*3/uL (ref 0.1–1.0)
Monocytes Relative: 7 %
Neutro Abs: 7.8 10*3/uL — ABNORMAL HIGH (ref 1.7–7.7)
Neutrophils Relative %: 81 %
Platelets: 151 10*3/uL (ref 150–400)
RBC: 4.54 MIL/uL (ref 4.22–5.81)
RDW: 13.7 % (ref 11.5–15.5)
WBC: 9.7 10*3/uL (ref 4.0–10.5)
nRBC: 0 % (ref 0.0–0.2)

## 2022-06-18 LAB — MRSA NEXT GEN BY PCR, NASAL: MRSA by PCR Next Gen: NOT DETECTED

## 2022-06-18 LAB — RESP PANEL BY RT-PCR (RSV, FLU A&B, COVID)  RVPGX2
Influenza A by PCR: NEGATIVE
Influenza B by PCR: NEGATIVE
Resp Syncytial Virus by PCR: NEGATIVE
SARS Coronavirus 2 by RT PCR: NEGATIVE

## 2022-06-18 LAB — I-STAT VENOUS BLOOD GAS, ED
Acid-Base Excess: 4 mmol/L — ABNORMAL HIGH (ref 0.0–2.0)
Bicarbonate: 29.8 mmol/L — ABNORMAL HIGH (ref 20.0–28.0)
Calcium, Ion: 1.13 mmol/L — ABNORMAL LOW (ref 1.15–1.40)
HCT: 41 % (ref 39.0–52.0)
Hemoglobin: 13.9 g/dL (ref 13.0–17.0)
O2 Saturation: 48 %
Potassium: 5.4 mmol/L — ABNORMAL HIGH (ref 3.5–5.1)
Sodium: 136 mmol/L (ref 135–145)
TCO2: 31 mmol/L (ref 22–32)
pCO2, Ven: 46.1 mmHg (ref 44–60)
pH, Ven: 7.419 (ref 7.25–7.43)
pO2, Ven: 26 mmHg — CL (ref 32–45)

## 2022-06-18 LAB — URINALYSIS, ROUTINE W REFLEX MICROSCOPIC
Bilirubin Urine: NEGATIVE
Glucose, UA: NEGATIVE mg/dL
Hgb urine dipstick: NEGATIVE
Ketones, ur: NEGATIVE mg/dL
Leukocytes,Ua: NEGATIVE
Nitrite: NEGATIVE
Protein, ur: 30 mg/dL — AB
Specific Gravity, Urine: 1.02 (ref 1.005–1.030)
pH: 7 (ref 5.0–8.0)

## 2022-06-18 LAB — TROPONIN I (HIGH SENSITIVITY)
Troponin I (High Sensitivity): 5 ng/L (ref <18)
Troponin I (High Sensitivity): 7 ng/L (ref <18)

## 2022-06-18 LAB — URINALYSIS, MICROSCOPIC (REFLEX): RBC / HPF: NONE SEEN RBC/hpf (ref 0–5)

## 2022-06-18 LAB — LACTIC ACID, PLASMA
Lactic Acid, Venous: 2.3 mmol/L (ref 0.5–1.9)
Lactic Acid, Venous: 3 mmol/L (ref 0.5–1.9)
Lactic Acid, Venous: 3.3 mmol/L (ref 0.5–1.9)
Lactic Acid, Venous: 5.1 mmol/L (ref 0.5–1.9)

## 2022-06-18 LAB — GLUCOSE, CAPILLARY: Glucose-Capillary: 260 mg/dL — ABNORMAL HIGH (ref 70–99)

## 2022-06-18 LAB — BRAIN NATRIURETIC PEPTIDE: B Natriuretic Peptide: 80.4 pg/mL (ref 0.0–100.0)

## 2022-06-18 MED ORDER — INSULIN ASPART 100 UNIT/ML IJ SOLN
0.0000 [IU] | Freq: Three times a day (TID) | INTRAMUSCULAR | Status: DC
  Administered 2022-06-19: 3 [IU] via SUBCUTANEOUS
  Administered 2022-06-19: 1 [IU] via SUBCUTANEOUS
  Administered 2022-06-19 – 2022-06-20 (×2): 2 [IU] via SUBCUTANEOUS
  Administered 2022-06-20: 3 [IU] via SUBCUTANEOUS
  Administered 2022-06-20: 2 [IU] via SUBCUTANEOUS
  Administered 2022-06-21: 3 [IU] via SUBCUTANEOUS
  Administered 2022-06-21 (×2): 1 [IU] via SUBCUTANEOUS
  Administered 2022-06-22: 2 [IU] via SUBCUTANEOUS
  Administered 2022-06-22 – 2022-06-23 (×3): 5 [IU] via SUBCUTANEOUS
  Administered 2022-06-23: 1 [IU] via SUBCUTANEOUS
  Administered 2022-06-24: 3 [IU] via SUBCUTANEOUS
  Administered 2022-06-24: 2 [IU] via SUBCUTANEOUS

## 2022-06-18 MED ORDER — LACTATED RINGERS IV BOLUS (SEPSIS)
250.0000 mL | Freq: Once | INTRAVENOUS | Status: AC
  Administered 2022-06-18: 250 mL via INTRAVENOUS

## 2022-06-18 MED ORDER — ARFORMOTEROL TARTRATE 15 MCG/2ML IN NEBU
15.0000 ug | INHALATION_SOLUTION | Freq: Two times a day (BID) | RESPIRATORY_TRACT | Status: DC
  Administered 2022-06-18 – 2022-06-24 (×12): 15 ug via RESPIRATORY_TRACT
  Filled 2022-06-18 (×12): qty 2

## 2022-06-18 MED ORDER — ORAL CARE MOUTH RINSE: 15.0000 mL | OROMUCOSAL | Status: DC | PRN

## 2022-06-18 MED ORDER — PANTOPRAZOLE SODIUM 40 MG PO TBEC
40.0000 mg | DELAYED_RELEASE_TABLET | Freq: Two times a day (BID) | ORAL | Status: DC
  Administered 2022-06-18 – 2022-06-24 (×12): 40 mg via ORAL
  Filled 2022-06-18 (×12): qty 1

## 2022-06-18 MED ORDER — ASPIRIN 81 MG PO TBEC
81.0000 mg | DELAYED_RELEASE_TABLET | Freq: Every morning | ORAL | Status: DC
  Administered 2022-06-19 – 2022-06-24 (×6): 81 mg via ORAL
  Filled 2022-06-18 (×6): qty 1

## 2022-06-18 MED ORDER — INSULIN ASPART 100 UNIT/ML IJ SOLN
0.0000 [IU] | Freq: Every day | INTRAMUSCULAR | Status: DC
  Administered 2022-06-18 – 2022-06-19 (×2): 3 [IU] via SUBCUTANEOUS
  Administered 2022-06-21: 2 [IU] via SUBCUTANEOUS
  Administered 2022-06-23: 3 [IU] via SUBCUTANEOUS

## 2022-06-18 MED ORDER — ACETAMINOPHEN 500 MG PO TABS
1000.0000 mg | ORAL_TABLET | Freq: Once | ORAL | Status: AC
  Administered 2022-06-18: 1000 mg via ORAL
  Filled 2022-06-18: qty 2

## 2022-06-18 MED ORDER — ACETAMINOPHEN 650 MG RE SUPP: 650.0000 mg | Freq: Four times a day (QID) | RECTAL | Status: DC | PRN

## 2022-06-18 MED ORDER — ACETAMINOPHEN 325 MG PO TABS
650.0000 mg | ORAL_TABLET | Freq: Four times a day (QID) | ORAL | Status: DC | PRN
  Filled 2022-06-18: qty 2

## 2022-06-18 MED ORDER — SODIUM CHLORIDE 0.9 % IV SOLN
2.0000 g | INTRAVENOUS | Status: AC
  Administered 2022-06-18 – 2022-06-22 (×5): 2 g via INTRAVENOUS
  Filled 2022-06-18 (×5): qty 20

## 2022-06-18 MED ORDER — IPRATROPIUM-ALBUTEROL 0.5-2.5 (3) MG/3ML IN SOLN
RESPIRATORY_TRACT | Status: AC
  Administered 2022-06-18: 6 mL
  Filled 2022-06-18: qty 6

## 2022-06-18 MED ORDER — IPRATROPIUM-ALBUTEROL 0.5-2.5 (3) MG/3ML IN SOLN: 3.0000 mL | RESPIRATORY_TRACT | Status: DC | PRN

## 2022-06-18 MED ORDER — MAGNESIUM SULFATE 2 GM/50ML IV SOLN
2.0000 g | Freq: Once | INTRAVENOUS | Status: AC
  Administered 2022-06-18: 2 g via INTRAVENOUS
  Filled 2022-06-18: qty 50

## 2022-06-18 MED ORDER — METHYLPREDNISOLONE SODIUM SUCC 40 MG IJ SOLR
40.0000 mg | Freq: Two times a day (BID) | INTRAMUSCULAR | Status: DC
  Administered 2022-06-19 – 2022-06-20 (×3): 40 mg via INTRAVENOUS
  Filled 2022-06-18 (×3): qty 1

## 2022-06-18 MED ORDER — SODIUM CHLORIDE 0.9 % IV SOLN: INTRAVENOUS | Status: DC

## 2022-06-18 MED ORDER — SODIUM CHLORIDE 0.9% FLUSH
3.0000 mL | Freq: Two times a day (BID) | INTRAVENOUS | Status: DC
  Administered 2022-06-18 – 2022-06-24 (×12): 3 mL via INTRAVENOUS

## 2022-06-18 MED ORDER — ENOXAPARIN SODIUM 40 MG/0.4ML IJ SOSY
40.0000 mg | PREFILLED_SYRINGE | Freq: Every day | INTRAMUSCULAR | Status: DC
  Administered 2022-06-18 – 2022-06-23 (×6): 40 mg via SUBCUTANEOUS
  Filled 2022-06-18 (×6): qty 0.4

## 2022-06-18 MED ORDER — PRIMIDONE 50 MG PO TABS
50.0000 mg | ORAL_TABLET | Freq: Two times a day (BID) | ORAL | Status: DC
  Administered 2022-06-18 – 2022-06-24 (×12): 50 mg via ORAL
  Filled 2022-06-18 (×12): qty 1

## 2022-06-18 MED ORDER — ONDANSETRON HCL 4 MG PO TABS: 4.0000 mg | ORAL_TABLET | Freq: Four times a day (QID) | ORAL | Status: DC | PRN

## 2022-06-18 MED ORDER — LACTATED RINGERS IV BOLUS
500.0000 mL | Freq: Once | INTRAVENOUS | Status: AC
  Administered 2022-06-18: 500 mL via INTRAVENOUS

## 2022-06-18 MED ORDER — LACTATED RINGERS IV BOLUS (SEPSIS)
1000.0000 mL | Freq: Once | INTRAVENOUS | Status: AC
  Administered 2022-06-18: 1000 mL via INTRAVENOUS

## 2022-06-18 MED ORDER — LACTATED RINGERS IV SOLN: INTRAVENOUS | Status: DC

## 2022-06-18 MED ORDER — IPRATROPIUM-ALBUTEROL 0.5-2.5 (3) MG/3ML IN SOLN
RESPIRATORY_TRACT | Status: AC
  Filled 2022-06-18: qty 3

## 2022-06-18 MED ORDER — LIP MEDEX EX OINT
TOPICAL_OINTMENT | CUTANEOUS | Status: DC | PRN
  Filled 2022-06-18: qty 7

## 2022-06-18 MED ORDER — SODIUM CHLORIDE 0.9 % IV SOLN: INTRAVENOUS | Status: DC | PRN

## 2022-06-18 MED ORDER — SENNOSIDES-DOCUSATE SODIUM 8.6-50 MG PO TABS: 1.0000 | ORAL_TABLET | Freq: Every evening | ORAL | Status: DC | PRN

## 2022-06-18 MED ORDER — CHLORHEXIDINE GLUCONATE CLOTH 2 % EX PADS
6.0000 | MEDICATED_PAD | Freq: Every day | CUTANEOUS | Status: DC
  Administered 2022-06-18 – 2022-06-20 (×3): 6 via TOPICAL

## 2022-06-18 MED ORDER — ONDANSETRON HCL 4 MG/2ML IJ SOLN: 4.0000 mg | Freq: Four times a day (QID) | INTRAMUSCULAR | Status: DC | PRN

## 2022-06-18 MED ORDER — ALBUTEROL SULFATE HFA 108 (90 BASE) MCG/ACT IN AERS: 2.0000 | INHALATION_SPRAY | Freq: Four times a day (QID) | RESPIRATORY_TRACT | Status: DC | PRN

## 2022-06-18 MED ORDER — GUAIFENESIN ER 600 MG PO TB12
600.0000 mg | ORAL_TABLET | Freq: Two times a day (BID) | ORAL | Status: DC
  Administered 2022-06-18 – 2022-06-24 (×12): 600 mg via ORAL
  Filled 2022-06-18 (×12): qty 1

## 2022-06-18 MED ORDER — BUDESONIDE 0.25 MG/2ML IN SUSP
0.2500 mg | Freq: Two times a day (BID) | RESPIRATORY_TRACT | Status: DC
  Administered 2022-06-18 – 2022-06-24 (×12): 0.25 mg via RESPIRATORY_TRACT
  Filled 2022-06-18 (×12): qty 2

## 2022-06-18 MED ORDER — TRAMADOL HCL 50 MG PO TABS: 50.0000 mg | ORAL_TABLET | Freq: Two times a day (BID) | ORAL | Status: DC | PRN

## 2022-06-18 MED ORDER — SODIUM CHLORIDE 0.9 % IV SOLN
500.0000 mg | INTRAVENOUS | Status: DC
  Administered 2022-06-18 – 2022-06-19 (×2): 500 mg via INTRAVENOUS
  Filled 2022-06-18 (×3): qty 5

## 2022-06-18 MED ORDER — ALBUTEROL SULFATE (2.5 MG/3ML) 0.083% IN NEBU
INHALATION_SOLUTION | RESPIRATORY_TRACT | Status: AC
  Filled 2022-06-18: qty 3

## 2022-06-18 MED ORDER — METHYLPREDNISOLONE SODIUM SUCC 125 MG IJ SOLR
125.0000 mg | Freq: Once | INTRAMUSCULAR | Status: AC
  Administered 2022-06-18: 125 mg via INTRAVENOUS
  Filled 2022-06-18: qty 2

## 2022-06-18 MED ORDER — PRAVASTATIN SODIUM 20 MG PO TABS
20.0000 mg | ORAL_TABLET | Freq: Every day | ORAL | Status: DC
  Administered 2022-06-19 – 2022-06-24 (×6): 20 mg via ORAL
  Filled 2022-06-18 (×6): qty 1

## 2022-06-18 NOTE — ED Notes (Signed)
EDP notified of decreased BP readings x2. V/O given for 500 mL bolus of LR over 30 min.

## 2022-06-18 NOTE — ED Notes (Signed)
Pt oxygen saturation 89%. Pt changed to HFNC at 8L. RT aware.

## 2022-06-18 NOTE — Sepsis Progress Note (Signed)
[facility] will follow per sepsis protocol  

## 2022-06-18 NOTE — ED Notes (Signed)
ED Provider at bedside. 

## 2022-06-18 NOTE — ED Triage Notes (Signed)
Patient arrives in wheelchair by POV c/o shortness of breath onset of Friday. Reports cough 4-5 days with dark brown mucus. Patient usually on 2L [place] and turned it up to 3L [place] today.

## 2022-06-18 NOTE — ED Notes (Signed)
Dose rate changed to 125 ml/hr

## 2022-06-18 NOTE — Progress Notes (Signed)
Plan of Care Note for accepted transfer   Patient: Johnathan [name] MRN: [id number]   DOA: 06/18/2022  Facility requesting transfer: [facility] Requesting Provider: Arby Barrette, MD Reason for transfer: Severe sepsis secondary to pneumonia, COPD exacerbation Facility course:   Johnathan [name] is a 89 year old male with past medical history significant for type 2 diabetes mellitus, COPD on 2 L nasal cannula at baseline, hypertension, hyperlipidemia, GERD who presented to [facility] ED with shortness of breath since Friday.  Workup concerning for right lower lobe pneumonia based on chest x-ray.  WBC 9.7, lactic acid 2.3.  VBG with pH 7.419, pCO2 46.1, pO2 26.  COVID/influenza/RSV PCR negative.  Patient was started on azithromycin and ceftriaxone.  Has been given 3.250L LR bolus, IV Solu-Medrol, IV magnesium.  Oxygen now titrated up to 8 L high flow nasal cannula.   Plan of care: The patient is accepted for admission to [facility] unit, at [facility]   Discussed with Dr. Donnald Garre regarding concerns about decreasing blood pressure; last recorded 96/51 with a MAP of 66.Marland Kitchen  He received 2.5 L fluid bolus and received another 500 cc bolus now.  She states that patient is "much improved" and stable.  Discussed with her that if he further decompensates in terms of his blood pressure, may need to initiate vasopressors given he has been adequately fluid resuscitated and recommend consultation with the critical care service.    Author: Alvira Philips [place], DO 06/18/2022  Check [email or web address] for on-call coverage.  Nursing staff, Please call [facility] Admits [facility] Consults [facility]ide number on Amion as soon as patient's arrival, so appropriate admitting provider can evaluate the pt.

## 2022-06-18 NOTE — Progress Notes (Signed)
[facility] number paged. Patient arrived to stepdown.

## 2022-06-18 NOTE — ED Notes (Signed)
Pt verbally accepted consent for transport

## 2022-06-18 NOTE — ED Provider Notes (Addendum)
[facility] Provider Note   CSN: [id number] Arrival date & time: 06/18/22  1204     History  Chief Complaint  Patient presents with   Shortness of Breath    Johnathan [name] is a 89 y.o. male.  HPI Patient reports he has been sick for about 4-5 days.  He started with a cough and this is escalated to shortness of breath and chest pain.  Patient does have COPD and is on 2 L home oxygen at baseline.  He was trying to use his inhalers at home but today got so short of breath he could not even managed to use the inhalers.  He reports now he is aching all over from his feet to his chest.    Home Medications Prior to Admission medications   Medication Sig Start Date End Date Taking? Authorizing Provider  albuterol (VENTOLIN HFA) 108 (90 Base) MCG/ACT inhaler Inhale 2 puffs into the lungs every 6 (six) hours as needed for wheezing or shortness of breath. 05/06/22   Copland, Gwenlyn Found, MD  amLODipine (NORVASC) 5 MG tablet Take 1 tablet (5 mg total) by mouth daily. 05/06/22   Copland, Gwenlyn Found, MD  aspirin EC 81 MG tablet Take 81 mg by mouth every morning.    [provider]  B Complex-C (SUPER B COMPLEX PO) Take 1 tablet by mouth at bedtime.    [provider]  benzonatate (TESSALON) 200 MG capsule Take 1 capsule (200 mg total) by mouth 3 (three) times daily as needed for cough. Patient not taking: Reported on 06/03/2022 04/11/22   Hyman Hopes B, NP  Cholecalciferol (VITAMIN D3 GUMMIES PO) Take 2 tablets by mouth every morning.    [provider]  Coenzyme Q10 (COQ10 PO) Take 1 capsule by mouth every morning. Patient not taking: Reported on 06/03/2022    [provider]  Flaxseed, Linseed, (FLAXSEED OIL) 1200 MG CAPS Take 1 capsule by mouth at bedtime.    [provider]  Fluticasone-Umeclidin-Vilant (TRELEGY ELLIPTA) 100-62.5-25 MCG/ACT AEPB Inhale 1 puff into the lungs at bedtime. 09/14/21   Glade Lloyd,  MD  furosemide (LASIX) 20 MG tablet Take 1 tablet (20 mg total) by mouth daily as needed for edema. Patient not taking: Reported on 06/03/2022 09/27/21   Copland, Gwenlyn Found, MD  Ginkgo Biloba EXTR Take 1 tablet by mouth 2 (two) times daily.    [provider]  guaiFENesin (MUCINEX) 600 MG 12 hr tablet Take 2 tablets (1,200 mg total) by mouth 2 (two) times daily. Patient taking differently: Take 1,200 mg by mouth as needed. 09/14/21   Glade Lloyd, MD  ketoconazole (NIZORAL) 2 % cream Apply 1 Application topically daily. Use as needed for fungal infection 01/23/22   Copland, Gwenlyn Found, MD  loratadine (CLARITIN) 10 MG tablet TAKE 1 TABLET EVERY DAY Patient taking differently: Take 10 mg by mouth every morning. 09/21/20   Copland, Gwenlyn Found, MD  meloxicam (MOBIC) 15 MG tablet TAKE 1 TABLET BY MOUTH DAILY AS  NEEDED FOR JOINT PAIN 09/17/21   Copland, Gwenlyn Found, MD  metFORMIN (GLUCOPHAGE) 500 MG tablet Take 0.5 tablets (250 mg total) by mouth daily. 04/15/22   Copland, Gwenlyn Found, MD  Misc Natural Products (HIMALAYAN GOJI PO) Take 6 Doses by mouth daily. Goji berries    [provider]  Multiple Vitamin (MULTIVITAMIN WITH MINERALS) TABS tablet Take 1 tablet by mouth every morning.    [provider]  omeprazole (PRILOSEC)  20 MG capsule TAKE 1 CAPSULE BY MOUTH TWICE  DAILY 03/06/22   Copland, Gwenlyn Found, MD  OXYGEN Inhale 2 L into the lungs continuous.    [provider]  pravastatin (PRAVACHOL) 40 MG tablet Take 0.5 tablets (20 mg total) by mouth daily. 04/10/22   Copland, Gwenlyn Found, MD  primidone (MYSOLINE) 50 MG tablet TAKE 1 TABLET BY MOUTH TWICE  DAILY 12/05/21   Copland, Gwenlyn Found, MD  tadalafil (CIALIS) 20 MG tablet TAKE 1/2 TO 1 TAB po 2 HRS PRIOR TO SEXUAL ACTIVITY 05/15/22   Joline Maxcy, MD  terazosin (HYTRIN) 1 MG capsule Take 1 capsule (1 mg total) by mouth 2 (two) times daily. 04/10/22   Copland, Gwenlyn Found, MD  Tetrahydrozoline HCl (VISINE OP) Place 1 drop into  both eyes 2 (two) times daily.    [provider]  traMADol (ULTRAM) 50 MG tablet TAKE 1 TABLET BY MOUTH EVERY 12  HOURS AS NEEDED FOR MODERATE  PAIN 05/08/22   Copland, Gwenlyn Found, MD  triamcinolone cream (KENALOG) 0.1 % Apply 1 Application topically 2 (two) times daily as needed (rash/itching). 09/14/21   Glade Lloyd, MD      Allergies    Patient has no known allergies.    Review of Systems   Review of Systems  Physical Exam Updated Vital Signs BP (!) 103/57   Pulse (!) 117   Temp (!) 101.5 F (38.6 C)   Resp (!) 28   Ht 5\' 9"  (1.753 m)   Wt 66.2 kg   SpO2 91%   BMI 21.56 kg/m  Physical Exam Constitutional:      Comments: Patient is in moderate respiratory distress.  He is speaking in short sentences.  Alert with clear mental status.  HENT:     Mouth/Throat:     Pharynx: Oropharynx is clear.  Eyes:     Extraocular Movements: Extraocular movements intact.  Cardiovascular:     Comments: Tachycardia.  Cannot appreciate rub or gallop.  No significant background respiratory noise. Pulmonary:     Comments: Moderate respiratory distress.  Patient does have cough.  Wheezing throughout lung fields.  Scattered crackles. Abdominal:     General: There is no distension.     Palpations: Abdomen is soft.     Tenderness: There is no abdominal tenderness. There is no guarding.  Musculoskeletal:     Comments: No significant peripheral edema.  Calves are soft and nontender.  Skin:    General: Skin is warm and dry.  Neurological:     General: No focal deficit present.     Mental Status: He is oriented to person, place, and time.     Coordination: Coordination normal.     ED Results / Procedures / Treatments   Labs (all labs ordered are listed, but only abnormal results are displayed) Labs Reviewed  COMPREHENSIVE METABOLIC PANEL - Abnormal; Notable for the following components:      Result Value   Glucose, Bld 141 (*)    Total Protein 8.5 (*)    All other components  within normal limits  LACTIC ACID, PLASMA - Abnormal; Notable for the following components:   Lactic Acid, Venous 2.3 (*)    All other components within normal limits  CBC WITH DIFFERENTIAL/PLATELET - Abnormal; Notable for the following components:   Neutro Abs 7.8 (*)    All other components within normal limits  URINALYSIS, ROUTINE W REFLEX MICROSCOPIC - Abnormal; Notable for the following components:   Protein, ur 30 (*)  All other components within normal limits  URINALYSIS, MICROSCOPIC (REFLEX) - Abnormal; Notable for the following components:   Bacteria, UA RARE (*)    All other components within normal limits  I-STAT VENOUS BLOOD GAS, ED - Abnormal; Notable for the following components:   pO2, Ven 26 (*)    Bicarbonate 29.8 (*)    Acid-Base Excess 4.0 (*)    Potassium 5.4 (*)    Calcium, Ion 1.13 (*)    All other components within normal limits  RESP PANEL BY RT-PCR (RSV, FLU A&B, COVID)  RVPGX2  CULTURE, BLOOD (ROUTINE X 2)  CULTURE, BLOOD (ROUTINE X 2)  BRAIN NATRIURETIC PEPTIDE  LACTIC ACID, PLASMA  TROPONIN I (HIGH SENSITIVITY)  TROPONIN I (HIGH SENSITIVITY)    EKG EKG Interpretation  Date/Time:  Tuesday Jun 18 2022 12:14:25 EDT Ventricular Rate:  121 PR Interval:  138 QRS Duration: 92 QT Interval:  294 QTC Calculation: 418 R Axis:   79 Text Interpretation: Sinus tachycardia Multiple ventricular premature complexes Artifact in lead(s) I II III aVR aVL aVF V1 V2 V6 no change from previous except artifact Confirmed by Arby Barrette [phone]) on 06/18/2022 12:26:47 PM  Radiology DG Chest Port 1 View  Result Date: 06/18/2022 CLINICAL DATA:  Shortness of breath, cough. EXAM: PORTABLE CHEST 1 VIEW COMPARISON:  March 05, 2022. FINDINGS: The heart size and mediastinal contours are within normal limits. Left lung is clear. Interval development of right basilar opacity concerning for pneumonia. The visualized skeletal structures are unremarkable. IMPRESSION: Right  basilar opacity is noted concerning for pneumonia. Followup PA and lateral chest X-ray is recommended in 3-4 weeks following trial of antibiotic therapy to ensure resolution and exclude underlying malignancy. Electronically Signed   By: Lupita Raider M.D.   On: 06/18/2022 13:27    Procedures Procedures   CRITICAL CARE Performed by: Arby Barrette   Total critical care time: 30 minutes  Critical care time was exclusive of separately billable procedures and treating other patients.  Critical care was necessary to treat or prevent imminent or life-threatening deterioration.  Critical care was time spent personally by me on the following activities: development of treatment plan with patient and/or surrogate as well as nursing, discussions with consultants, evaluation of patient's response to treatment, examination of patient, obtaining history from patient or surrogate, ordering and performing treatments and interventions, ordering and review of laboratory studies, ordering and review of radiographic studies, pulse oximetry and re-evaluation of patient's condition.  Medications Ordered in ED Medications  lactated ringers infusion ( Intravenous New Bag/Given 06/18/22 1412)  cefTRIAXone (ROCEPHIN) 2 g in sodium chloride 0.9 % 100 mL IVPB (0 g Intravenous Stopped 06/18/22 1408)  azithromycin (ZITHROMAX) 500 mg in sodium chloride 0.9 % 250 mL IVPB (500 mg Intravenous New Bag/Given 06/18/22 1344)  0.9 %  sodium chloride infusion ( Intravenous New Bag/Given 06/18/22 1257)  ipratropium-albuterol (DUONEB) 0.5-2.5 (3) MG/3ML nebulizer solution (6 mLs  Given 06/18/22 1217)  methylPREDNISolone sodium succinate (SOLU-MEDROL) 125 mg/2 mL injection 125 mg (125 mg Intravenous Given 06/18/22 1244)  magnesium sulfate IVPB 2 g 50 mL (0 g Intravenous Stopped 06/18/22 1413)  acetaminophen (TYLENOL) tablet 1,000 mg (1,000 mg Oral Given 06/18/22 1300)  albuterol (PROVENTIL) (2.5 MG/3ML) 0.083% nebulizer solution (  Given  06/18/22 1245)  ipratropium-albuterol (DUONEB) 0.5-2.5 (3) MG/3ML nebulizer solution (  Given 06/18/22 1245)  lactated ringers bolus 1,000 mL (0 mLs Intravenous Stopped 06/18/22 1413)    And  lactated ringers bolus 1,000 mL (0 mLs Intravenous Stopped  06/18/22 1409)    And  lactated ringers bolus 250 mL (0 mLs Intravenous Stopped 06/18/22 1413)    ED Course/ Medical Decision Making/ A&P                             Medical Decision Making Amount and/or Complexity of Data Reviewed Labs: ordered. Radiology: ordered.  Risk OTC drugs. Prescription drug management. Decision regarding hospitalization.   Patient presents as outlined with fever and cough.  He has history of COPD with baseline oxygen use.  Patient is in moderate respiratory distress.  Findings most consistent with pneumonia\COPD exacerbation.  Patient is immediately placed on DuoNeb therapy by respiratory.  Will continue with another DuoNeb treatment and initiate Solu-Medrol and magnesium therapy.  Suspect sepsis with fever and tachycardia.  Will initiate sepsis protocol as well.  0.4 pCO2 46.  Patient does not appear to be retaining significant CO2.  More likely pneumonia with COPD exacerbation.  Chest x-ray visually reviewed by myself and reviewed by radiology.  Radiology notes right basilar opacity consistent with infiltrate.  Lungs are otherwise grossly clear.  Lactic acid 2.4.  White count 9.7.  Basic metabolic panel with LFTs normal.  Recheck 14: 42 patient is on 8 L high flow nasal cannula oxygen.  He does look improved.  He is speaking in full sentences.  He is alert.  Oxygen saturation 92 to 95%.  Findings consistent with pneumonia with concomitant COPD exacerbation.  Patient is requiring high flow oxygen.  He will require admission.  I confirmed DNR status with the patient.  He does not wish to be shocked or have chest compressions in the event of arrest.  He does want medical care that would be life-sustaining if he  has a good chance for viability.  This includes intubation, but does not want to be sustained on the ventilator.  Consult: Dr, [place] [facility] hospitalist for admission.  We discussed the patient has improved dramatically since arrival.  Pressures have trended down but clinically patient does not appear hypoperfused.  He will be getting some additional volume resuscitation after his initial 30 cc/kg fluid resuscitation.  Dr. Hart [place] will reassess for stability of blood pressures.       Final Clinical Impression(s) / ED Diagnoses Final diagnoses:  COPD exacerbation (HCC)  Community acquired pneumonia, unspecified laterality    Rx / DC Orders ED Discharge Orders     None         Arby Barrette, MD 06/18/22 1510    Arby Barrette, MD 06/18/22 1529    Arby Barrette, MD 06/18/22 1538

## 2022-06-18 NOTE — Hospital Course (Signed)
Johnathan [name] is a 89 y.o. male with medical history significant for COPD, chronic respiratory failure with hypoxia on 2 L O2 via [place], T2DM, HTN, HLD, GERD who is admitted with severe sepsis due to pneumonia and acute COPD exacerbation.

## 2022-06-18 NOTE — H&P (Signed)
History and Physical    JAKEITH DEMONT WUJ:[id number] DOB: 1932/08/25 DOA: 06/18/2022  PCP: Pearline Cables, MD  Patient coming from: Home  I have personally briefly reviewed patient's old medical records in [facility]  Chief Complaint: Fatigue, cough  HPI: Johnathan [name] is a 89 y.o. male with medical history significant for COPD, chronic respiratory failure with hypoxia on 2 L O2 via [place], T2DM, HTN, HLD, GERD who presented to the ED for evaluation of fatigue and cough.  Patient reports 4 days of frequent cough occasionally productive of brown sputum.  He has not had any subjective fevers but reports chills.  He has been feeling generally weak and fatigued.  He has had increased his home 2 L O2 via [place] to 3 L.  He has had some exertional shortness of breath.  He denies any chest pain, nausea, vomiting, abdominal pain, dysuria, diarrhea.  [facility] ED Course  Labs/Imaging on admission: I have personally reviewed following labs and imaging studies.  Initial vitals showed BP 171/77, pulse 117, RR 22, temp 101.5 F, SpO2 85% on 6 3 O2 via [place].  Patient was placed on 8 L HFNC with improvement in SpO2 to 93%.  Patient was hypotensive to 96/51 which improved with IV fluid boluses.  Labs show WBC 9.7, hemoglobin 14.4, platelets 151,000, sodium 135, potassium 4.4, bicarb 25, BUN 15, creatinine 1.02, serum glucose 141, LFTs within normal limits, lactic acid 2.3 = 3.0, BNP 80.4, troponin negative x 2.  Urinalysis negative for UTI.  SARS-CoV-2, influenza, RSV PCR negative.  Blood cultures in process.  Portable chest x-ray showed right basilar opacity.  Patient was given IV Solu-Medrol 125 mg, 3.25 L LR, IV magnesium 2 g, IV ceftriaxone and azithromycin.  The hospitalist service was consulted to admit for further evaluation and management.  Review of Systems: All systems reviewed and are negative except as documented in history of present illness above.   Past Medical History:   Diagnosis Date   Anal fissure    COPD (chronic obstructive pulmonary disease) (HCC)    Diverticulosis    DM type 2 (diabetes mellitus, type 2) (HCC)    GERD (gastroesophageal reflux disease)    uses prilosec    Hypertension    Pneumonia 2018   Raynaud's disease    Shortness of breath dyspnea    WITH EXERTION   Sinusitis    Tremors of nervous system     Past Surgical History:  Procedure Laterality Date   COLONOSCOPY  03/27/2005   ESOPHAGOGASTRODUODENOSCOPY (EGD) WITH PROPOFOL N/A 02/08/2021   Procedure: ESOPHAGOGASTRODUODENOSCOPY (EGD) WITH PROPOFOL;  Surgeon: Rachael Fee, MD;  Location: [facility];  Service: Endoscopy;  Laterality: N/A;   HERNIA REPAIR     INGUINAL HERNIA REPAIR N/A 05/06/2019   Procedure: LAPAROSCOPIC RIGHT AND LEFT INGUINAL HERNIA(S) REPAIR;  Surgeon: Karie Soda, MD;  Location: [facility];  Service: General;  Laterality: N/A;    Social History:  reports that he quit smoking about 28 years ago. His smoking use included cigarettes. He has a 45.00 pack-year smoking history. He has never used smokeless tobacco. He reports current alcohol use. He reports that he does not use drugs.  No Known Allergies  Family History  Problem Relation Age of Onset   Dementia Brother    Diabetes Brother    Heart attack Father    Dementia Father      Prior to Admission medications   Medication Sig Start Date End Date  Taking? Authorizing Provider  albuterol (VENTOLIN HFA) 108 (90 Base) MCG/ACT inhaler Inhale 2 puffs into the lungs every 6 (six) hours as needed for wheezing or shortness of breath. 05/06/22  Yes Copland, Gwenlyn Found, MD  aspirin EC 81 MG tablet Take 81 mg by mouth every morning.   Yes [provider]  pravastatin (PRAVACHOL) 40 MG tablet Take 0.5 tablets (20 mg total) by mouth daily. 04/10/22  Yes Copland, Gwenlyn Found, MD  prednisoLONE acetate (PRED FORTE) 1 % ophthalmic suspension Place into the left eye. 05/31/22  Yes [provider]  primidone (MYSOLINE) 50 MG tablet TAKE 1 TABLET BY MOUTH TWICE  DAILY 12/05/21  Yes Copland, Gwenlyn Found, MD  tadalafil (CIALIS) 20 MG tablet TAKE 1/2 TO 1 TAB po 2 HRS PRIOR TO SEXUAL ACTIVITY 05/15/22  Yes Joline Maxcy, MD  terazosin (HYTRIN) 1 MG capsule Take 1 capsule (1 mg total) by mouth 2 (two) times daily. 04/10/22  Yes Copland, Gwenlyn Found, MD  amLODipine (NORVASC) 5 MG tablet Take 1 tablet (5 mg total) by mouth daily. 05/06/22   Copland, Gwenlyn Found, MD  B Complex-C (SUPER B COMPLEX PO) Take 1 tablet by mouth at bedtime.    [provider]  benzonatate (TESSALON) 200 MG capsule Take 1 capsule (200 mg total) by mouth 3 (three) times daily as needed for cough. Patient not taking: Reported on 06/03/2022 04/11/22   Hyman Hopes B, NP  Cholecalciferol (VITAMIN D3 GUMMIES PO) Take 2 tablets by mouth every morning.    [provider]  Flaxseed, Linseed, (FLAXSEED OIL) 1200 MG CAPS Take 1 capsule by mouth at bedtime.    [provider]  Fluticasone-Umeclidin-Vilant (TRELEGY ELLIPTA) 100-62.5-25 MCG/ACT AEPB Inhale 1 puff into the lungs at bedtime. 09/14/21   Glade Lloyd, MD  furosemide (LASIX) 20 MG tablet Take 1 tablet (20 mg total) by mouth daily as needed for edema. Patient not taking: Reported on 06/03/2022 09/27/21   Copland, Gwenlyn Found, MD  Ginkgo Biloba EXTR Take 1 tablet by mouth 2 (two) times daily.    [provider]  guaiFENesin (MUCINEX) 600 MG 12 hr tablet Take 2 tablets (1,200 mg total) by mouth 2 (two) times daily. Patient taking differently: Take 1,200 mg by mouth as needed. 09/14/21   Glade Lloyd, MD  ketoconazole (NIZORAL) 2 % cream Apply 1 Application topically daily. Use as needed for fungal infection 01/23/22   Copland, Gwenlyn Found, MD  loratadine (CLARITIN) 10 MG tablet TAKE 1 TABLET EVERY DAY Patient taking differently: Take 10 mg by mouth every morning. 09/21/20   Copland, Gwenlyn Found, MD  meloxicam (MOBIC) 15 MG tablet TAKE 1 TABLET  BY MOUTH DAILY AS  NEEDED FOR JOINT PAIN 09/17/21   Copland, Gwenlyn Found, MD  metFORMIN (GLUCOPHAGE) 500 MG tablet Take 0.5 tablets (250 mg total) by mouth daily. 04/15/22   Copland, Gwenlyn Found, MD  Misc Natural Products (HIMALAYAN GOJI PO) Take 6 Doses by mouth daily. Goji berries    [provider]  Multiple Vitamin (MULTIVITAMIN WITH MINERALS) TABS tablet Take 1 tablet by mouth every morning.    [provider]  omeprazole (PRILOSEC) 20 MG capsule TAKE 1 CAPSULE BY MOUTH TWICE  DAILY 03/06/22   Copland, Gwenlyn Found, MD  OXYGEN Inhale 2 L into the lungs continuous.    [provider]  traMADol (ULTRAM) 50 MG tablet TAKE 1 TABLET BY MOUTH EVERY 12  HOURS AS NEEDED FOR MODERATE  PAIN 05/08/22   Copland, Gwenlyn Found,  MD  triamcinolone cream (KENALOG) 0.1 % Apply 1 Application topically 2 (two) times daily as needed (rash/itching). 09/14/21   Glade Lloyd, MD    Physical Exam: Vitals:   06/18/22 1648 06/18/22 1700 06/18/22 1844 06/18/22 1900  BP:  (!) 115/56 (!) 114/54 (!) 113/56  Pulse:  96 88 76  Resp:  (!) 28 (!) 23 19  Temp: 98.2 F (36.8 C) 97.6 F (36.4 C) 99.3 F (37.4 C) 97.6 F (36.4 C)  TempSrc: Oral  Oral Oral  SpO2:  95% 100% 99%  Weight:      Height:       Constitutional: Currently man resting in bed with head elevated, NAD, calm, comfortable Eyes: EOMI, lids and conjunctivae normal ENMT: Mucous membranes are moist. Posterior pharynx clear of any exudate or lesions.Normal dentition.  Neck: normal, supple, no masses. Respiratory: Diffuse expiratory wheezing. Normal respiratory effort while on 8 L via HFNC. No accessory muscle use.  Frequent cough. Cardiovascular: Regular rate and rhythm, no murmurs / rubs / gallops. No extremity edema. 2+ pedal pulses. Abdomen: no tenderness, no masses palpated. Musculoskeletal: no clubbing / cyanosis. No joint deformity upper and lower extremities. Good ROM, no contractures. Normal muscle tone.  Skin: no rashes, lesions,  ulcers. No induration Neurologic: Sensation intact. Strength 5/5 in all 4.  Psychiatric: Alert and oriented x 3. Normal mood.   EKG: Personally reviewed. Sinus tachycardia, rate 121, PVCs present, motion artifact throughout.  Rate is faster and PVCs new when compared to prior.  Assessment/Plan Principal Problem:   Severe sepsis (HCC) Active Problems:   Community acquired pneumonia of right lower lobe of lung   COPD with acute exacerbation (HCC)   Type 2 diabetes mellitus (HCC)   Hypertension associated with diabetes (HCC)   Acute on chronic respiratory failure with hypoxia (HCC)   Hyperlipidemia associated with type 2 diabetes mellitus (HCC)   ZHANE CONTRERAZ is a 89 y.o. male with medical history significant for COPD, chronic respiratory failure with hypoxia on 2 L O2 via [place], T2DM, HTN, HLD, GERD who is admitted with severe sepsis due to pneumonia and acute COPD exacerbation.  Assessment and Plan: Severe sepsis due to right basilar pneumonia: Presenting with fever, tachycardia, tachypnea, lactic acidosis, and hypotension.  CXR shows right basilar opacity.  COVID, influenza, RSV negative. -Continue IV ceftriaxone and azithromycin -Continue IV fluid resuscitation overnight -Follow blood cultures -Check urine strep pneumonia and Legionella, respiratory pathogen panel  COPD with acute exacerbation: Significant expiratory wheezing diffuse lung fields on admission. -IV Solu-Medrol 40 mg twice daily -Start scheduled Brovana/Pulmicort, DuoNebs as needed -IS, FV, Mucinex -Wean supplemental O2 to home 2 L via [place] as able  Acute chronic respiratory failure with hypoxia: Uses 2 L supplemental O2 via [place] at baseline.  Hypoxic to 85% on 6 L and requiring 8 L via HFNC on admission secondary to pneumonia and COPD exacerbation.  Continue management as above and wean O2 as able.  Type 2 diabetes: Holding metformin and placed on SSI.  Hypertension: Holding antihypertensives due to  hypotension.  Hyperlipidemia: Continue pravastatin.   DVT prophylaxis: enoxaparin (LOVENOX) injection 40 mg Start: 06/18/22 2200 Code Status: DNR, would except intubation if necessary Family Communication: Discussed with patient, he has discussed with family Disposition Plan: From home, dispo pending clinical progress Consults called: None Severity of Illness: The appropriate patient status for this patient is INPATIENT. Inpatient status is judged to be reasonable and necessary in order to provide the required intensity of service to ensure the  patient's safety. The patient's presenting symptoms, physical exam findings, and initial radiographic and laboratory data in the context of their chronic comorbidities is felt to place them at high risk for further clinical deterioration. Furthermore, it is not anticipated that the patient will be medically stable for discharge from the hospital within 2 midnights of admission.   * I certify that at the point of admission it is my clinical judgment that the patient will require inpatient hospital care spanning beyond 2 midnights from the point of admission due to high intensity of service, high risk for further deterioration and high frequency of surveillance required.Darreld Mclean MD [facility]  If 7PM-7AM, please contact night-coverage [email or web address]  06/18/2022, 8:11 PM

## 2022-06-19 DIAGNOSIS — A419 Sepsis, unspecified organism: Secondary | ICD-10-CM | POA: Diagnosis not present

## 2022-06-19 DIAGNOSIS — R6520 Severe sepsis without septic shock: Secondary | ICD-10-CM | POA: Diagnosis not present

## 2022-06-19 LAB — GLUCOSE, CAPILLARY
Glucose-Capillary: 178 mg/dL — ABNORMAL HIGH (ref 70–99)
Glucose-Capillary: 146 mg/dL — ABNORMAL HIGH (ref 70–99)
Glucose-Capillary: 260 mg/dL — ABNORMAL HIGH (ref 70–99)
Glucose-Capillary: 268 mg/dL — ABNORMAL HIGH (ref 70–99)

## 2022-06-19 LAB — CBC
HCT: 36.3 % — ABNORMAL LOW (ref 39.0–52.0)
Hemoglobin: 12.1 g/dL — ABNORMAL LOW (ref 13.0–17.0)
MCH: 32.1 pg (ref 26.0–34.0)
MCHC: 33.3 g/dL (ref 30.0–36.0)
MCV: 96.3 fL (ref 80.0–100.0)
Platelets: 132 10*3/uL — ABNORMAL LOW (ref 150–400)
RBC: 3.77 MIL/uL — ABNORMAL LOW (ref 4.22–5.81)
RDW: 13.7 % (ref 11.5–15.5)
WBC: 10.9 10*3/uL — ABNORMAL HIGH (ref 4.0–10.5)
nRBC: 0 % (ref 0.0–0.2)

## 2022-06-19 LAB — RESPIRATORY PANEL BY PCR

## 2022-06-19 LAB — STREP PNEUMONIAE URINARY ANTIGEN: Strep Pneumo Urinary Antigen: NEGATIVE

## 2022-06-19 LAB — BASIC METABOLIC PANEL
Anion gap: 7 (ref 5–15)
BUN: 17 mg/dL (ref 8–23)
CO2: 26 mmol/L (ref 22–32)
Calcium: 8.8 mg/dL — ABNORMAL LOW (ref 8.9–10.3)
Chloride: 102 mmol/L (ref 98–111)
Creatinine, Ser: 0.88 mg/dL (ref 0.61–1.24)
GFR, Estimated: >60 mL/min (ref >60)
Glucose, Bld: 211 mg/dL — ABNORMAL HIGH (ref 70–99)
Potassium: 4.4 mmol/L (ref 3.5–5.1)
Sodium: 135 mmol/L (ref 135–145)

## 2022-06-19 LAB — CULTURE, BLOOD (ROUTINE X 2)

## 2022-06-19 LAB — LACTIC ACID, PLASMA: Lactic Acid, Venous: 1.5 mmol/L (ref 0.5–1.9)

## 2022-06-19 LAB — PROCALCITONIN: Procalcitonin: 10.68 ng/mL

## 2022-06-19 MED ORDER — IPRATROPIUM-ALBUTEROL 0.5-2.5 (3) MG/3ML IN SOLN
3.0000 mL | Freq: Four times a day (QID) | RESPIRATORY_TRACT | Status: DC
  Administered 2022-06-19 – 2022-06-24 (×17): 3 mL via RESPIRATORY_TRACT
  Filled 2022-06-19 (×17): qty 3

## 2022-06-19 NOTE — Evaluation (Signed)
Physical Therapy Evaluation Patient Details Name: Johnathan [name] MRN: [id number] DOB: Dec 04, 1932 Today's Date: 06/19/2022  History of Present Illness  Patient is a 89 year old male who presented on 5/28 with cough and fatigue. Patient was admitted with CAP, acute on chronic hypoxic respiratory failure, and COPD exacerbation. PMH: GERD, HTN, DM II, HLD, essential tremor  Clinical Impression  Pt admitted with above diagnosis.  Pt currently with functional limitations due to the deficits listed below (see PT Problem List). Pt will benefit from acute skilled PT to increase their independence and safety with mobility to allow discharge.    Patient presents with mild unsteadiness for ambulation without a device.  Patient is independent  PTA.   Patient  should progress to return home. Patient will benefit from continued PT while in hospital.  [id number]% at rest, 91 ambulating on 4.5 LPM. Patient with frequent strong coughing.        Recommendations for follow up therapy are one component of a multi-disciplinary discharge planning process, led by the attending physician.  Recommendations may be updated based on patient status, additional functional criteria and insurance authorization.  Follow Up Recommendations       Assistance Recommended at Discharge Set up Supervision/Assistance  Patient can return home with the following  A little help with walking and/or transfers;A little help with bathing/dressing/bathroom;Assistance with cooking/housework;Assist for transportation;Help with stairs or ramp for entrance    Equipment Recommendations None recommended by PT  Recommendations for Other Services       Functional Status Assessment Patient has had a recent decline in their functional status and demonstrates the ability to make significant improvements in function in a reasonable and predictable amount of time.     Precautions / Restrictions Precautions Precautions: Fall Precaution Comments:  monitor O2 Restrictions Weight Bearing Restrictions: No      Mobility  Bed Mobility               General bed mobility comments: increased time and cues with wires    Transfers Overall transfer level: Needs assistance   Transfers: Sit to/from Stand Sit to Stand: Min assist           General transfer comment: cues for safety, unsteady upon rising to stand      Ambulation/Gait Ambulation/Gait assistance: Min assist Gait Distance (Feet): 50 Feet Assistive device: held bed at times, No AD Gait Pattern/deviations: Step-through pattern Gait velocity: decr     General Gait Details: patient slighty unsteady,  Stairs            Wheelchair Mobility    Modified Rankin (Stroke Patients Only)       Balance Overall balance assessment: Mild deficits observed, not formally tested                                           Pertinent Vitals/Pain Pain Assessment Pain Assessment: No/denies pain    Home Living Family/patient expects to be discharged to:: Private residence Living Arrangements: Non-relatives/Friends (roommate)   Type of Home: House         Home Layout: Able to live on main level with bedroom/bathroom Home Equipment: Agricultural consultant (2 wheels)      Prior Function Prior Level of Function : Independent/Modified Independent               ADLs Comments: reported he had weened himself off O2 and  returned to work prior to admission to the hosptial.     Hand Dominance   Dominant Hand: Right    Extremity/Trunk Assessment   Upper Extremity Assessment Upper Extremity Assessment: Overall WFL for tasks assessed    Lower Extremity Assessment Lower Extremity Assessment: Generalized weakness    Cervical / Trunk Assessment Cervical / Trunk Assessment: Normal  Communication   Communication: No difficulties  Cognition                                                General Comments       Exercises     Assessment/Plan    PT Assessment Patient needs continued PT services  PT Problem List Decreased strength;Decreased mobility;Decreased activity tolerance;Cardiopulmonary status limiting activity;Decreased balance       PT Treatment Interventions DME instruction;Therapeutic activities;Gait training;Therapeutic exercise;Functional mobility training    PT Goals (Current goals can be found in the Care Plan section)  Acute Rehab PT Goals Patient Stated Goal: to go home PT Goal Formulation: With patient Time For Goal Achievement: 07/03/22 Potential to Achieve Goals: Good    Frequency Min 1X/week     Co-evaluation PT/OT/SLP Co-Evaluation/Treatment: Yes Reason for Co-Treatment: For patient/therapist safety PT goals addressed during session: Mobility/safety with mobility OT goals addressed during session: ADL's and self-care       AM-PAC PT "6 Clicks" Mobility  Outcome Measure Help needed turning from your back to your side while in a flat bed without using bedrails?: None Help needed moving from lying on your back to sitting on the side of a flat bed without using bedrails?: None Help needed moving to and from a bed to a chair (including a wheelchair)?: A Little Help needed standing up from a chair using your arms (e.g., wheelchair or bedside chair)?: A Little Help needed to walk in hospital room?: A Lot Help needed climbing 3-5 steps with a railing? : A Lot 6 Click Score: 18    End of Session Equipment Utilized During Treatment: Gait belt Activity Tolerance: Patient tolerated treatment well (limited by cough and  SOB) Patient left: in chair;with chair alarm set;with nursing/sitter in room;with call bell/phone within reach Nurse Communication: Mobility status PT Visit Diagnosis: Unsteadiness on feet (R26.81);Difficulty in walking, not elsewhere classified (R26.2)    Time: 1030-1041 PT Time Calculation (min) (ACUTE ONLY): 11 min   Charges:   PT  Evaluation $PT Eval Low Complexity: 1 Low          Blanchard Kelch PT Acute Rehabilitation Services Office [phone] Weekend [phone]   Rada Hay 06/19/2022, 3:47 PM

## 2022-06-19 NOTE — Progress Notes (Signed)
RT note: Pt. given/Instructed on Flutter-Mucus clearance device per order, stated he has one at home, used correctly on own, able to use independently.

## 2022-06-19 NOTE — Evaluation (Signed)
Occupational Therapy Evaluation Patient Details Name: Johnathan [name] MRN: [id number] DOB: February 25, 1932 Today's Date: 06/19/2022   History of Present Illness Patient is a 89 year old male who presented on 5/28 with cough and fatigue. Patient was admitted with CAP, acute on chronic hypoxic respiratory failure, and COPD exacerbation. PMH: GERD, HTN, DM II, HLD, essential tremor   Clinical Impression   Patient is a 89 year old male who was admitted for above. Patient was living independently prior level. Currently, patient was min guard with some unsteadiness noted during mobility in room, with decreased functional activity tolerance. Anticipate patient will be able to progress to PLOF during acute stay. Patient would continue to benefit from skilled OT services at this time while admitted  to address noted deficits in order to improve overall safety and independence in ADLs.        Recommendations for follow up therapy are one component of a multi-disciplinary discharge planning process, led by the attending physician.  Recommendations may be updated based on patient status, additional functional criteria and insurance authorization.   Assistance Recommended at Discharge None  Patient can return home with the following A little help with walking and/or transfers;A little help with bathing/dressing/bathroom;Assistance with cooking/housework;Direct supervision/assist for medications management;Assist for transportation    Functional Status Assessment  Patient has had a recent decline in their functional status and demonstrates the ability to make significant improvements in function in a reasonable and predictable amount of time.  Equipment Recommendations  None recommended by OT       Precautions / Restrictions Precautions Precaution Comments: monitor O2 Restrictions Weight Bearing Restrictions: No      Mobility Bed Mobility Overal bed mobility: Needs Assistance Bed Mobility: Supine to  Sit     Supine to sit: Supervision     General bed mobility comments: increased time and cues with wires            Balance Overall balance assessment: No apparent balance deficits (not formally assessed)             ADL either performed or assessed with clinical judgement   ADL Overall ADL's : Needs assistance/impaired Eating/Feeding: Modified independent   Grooming: Modified independent   Upper Body Bathing: Set up   Lower Body Bathing: Set up;Sitting/lateral leans Lower Body Bathing Details (indicate cue type and reason): able to complete figure four positioning in recliner in room Upper Body Dressing : Set up;Sitting   Lower Body Dressing: Set up;Sitting/lateral leans Lower Body Dressing Details (indicate cue type and reason): able to don/doff socks in recliner Toilet Transfer: Min guard;Ambulation Toilet Transfer Details (indicate cue type and reason): noted to have some unsteady moments but patient having no LOB Toileting- Clothing Manipulation and Hygiene: Min guard;Sit to/from stand               Vision Patient Visual Report: No change from baseline              Pertinent Vitals/Pain Pain Assessment Pain Assessment: No/denies pain     Hand Dominance Right   Extremity/Trunk Assessment Upper Extremity Assessment Upper Extremity Assessment: Overall WFL for tasks assessed   Lower Extremity Assessment Lower Extremity Assessment: Defer to PT evaluation   Cervical / Trunk Assessment Cervical / Trunk Assessment: Normal   Communication Communication Communication: No difficulties   Cognition Arousal/Alertness: Awake/alert Behavior During Therapy: WFL for tasks assessed/performed Overall Cognitive Status: Within Functional Limits for tasks assessed  Home Living Family/patient expects to be discharged to:: Private residence Living Arrangements: Non-relatives/Friends (roommate)   Type of Home: House       Home  Layout: Able to live on main level with bedroom/bathroom               Home Equipment: Agricultural consultant (2 wheels)          Prior Functioning/Environment Prior Level of Function : Independent/Modified Independent               ADLs Comments: reported he had weened himself off O2 and returned to work prior to admission to the hosptial.        OT Problem List: Decreased activity tolerance;Impaired balance (sitting and/or standing);Decreased coordination;Decreased safety awareness;Decreased knowledge of precautions      OT Treatment/Interventions: Self-care/ADL training;DME and/or AE instruction;Therapeutic exercise;Balance training    OT Goals(Current goals can be found in the care plan section) Acute Rehab OT Goals Patient Stated Goal: to get better OT Goal Formulation: With patient Time For Goal Achievement: 07/03/22 Potential to Achieve Goals: Fair  OT Frequency: Min 1X/week       AM-PAC OT "6 Clicks" Daily Activity     Outcome Measure Help from another person eating meals?: None Help from another person taking care of personal grooming?: None Help from another person toileting, which includes using toliet, bedpan, or urinal?: A Little Help from another person bathing (including washing, rinsing, drying)?: A Little Help from another person to put on and taking off regular upper body clothing?: None Help from another person to put on and taking off regular lower body clothing?: A Little 6 Click Score: 21   End of Session Equipment Utilized During Treatment: Oxygen Nurse Communication: Other (comment) (ok to participate in session)  Activity Tolerance: Patient tolerated treatment well Patient left: in chair;with call bell/phone within reach  OT Visit Diagnosis: Unsteadiness on feet (R26.81);Other abnormalities of gait and mobility (R26.89)                Time: 1028-1040 OT Time Calculation (min): 12 min Charges:  OT General Charges $OT Visit: 1 Visit OT  Evaluation $OT Eval Low Complexity: 1 Low  Arnel Wymer OTR/L, MS Acute Rehabilitation Department Office# [phone]   Selinda Flavin 06/19/2022, 12:18 PM

## 2022-06-19 NOTE — Evaluation (Signed)
Clinical/Bedside Swallow Evaluation Patient Details  Name: BOGART WINZELER MRN: [id number] Date of Birth: 04-Sep-1932  Today's Date: 06/19/2022 Time: SLP Start Time (ACUTE ONLY): 1526 SLP Stop Time (ACUTE ONLY): 1601 SLP Time Calculation (min) (ACUTE ONLY): 35 min  Past Medical History:  Past Medical History:  Diagnosis Date   Anal fissure    COPD (chronic obstructive pulmonary disease) (HCC)    Diverticulosis    DM type 2 (diabetes mellitus, type 2) (HCC)    GERD (gastroesophageal reflux disease)    uses prilosec    Hypertension    Pneumonia 2018   Raynaud's disease    Shortness of breath dyspnea    WITH EXERTION   Sinusitis    Tremors of nervous system    Past Surgical History:  Past Surgical History:  Procedure Laterality Date   COLONOSCOPY  03/27/2005   ESOPHAGOGASTRODUODENOSCOPY (EGD) WITH PROPOFOL N/A 02/08/2021   Procedure: ESOPHAGOGASTRODUODENOSCOPY (EGD) WITH PROPOFOL;  Surgeon: Rachael Fee, MD;  Location: [facility];  Service: Endoscopy;  Laterality: N/A;   HERNIA REPAIR     INGUINAL HERNIA REPAIR N/A 05/06/2019   Procedure: LAPAROSCOPIC RIGHT AND LEFT INGUINAL HERNIA(S) REPAIR;  Surgeon: Karie Soda, MD;  Location: [facility];  Service: General;  Laterality: N/A;   HPI:  Pt is an 89 yo male adm to [facility] with respiratory deficits.  PMH + for diabetes mellitus, COPD on 2 L nasal cannula at baseline, hypertension, hyperlipidemia, GERD and dysphagia. Workup concerning for right lower lobe pneumonia   history of dysphagia noted with prior esophagram and endoscopy completed.  DG esoph 2023 conducted due to globus, dysphagia;  Findings showed Possible filling defect along the PEJ - advised endoscopy to assess the pharyngo, Small type 1 hiatal hernia, ? Esophagitis.: Endoscopy.  01/2021 asymmetric pharynx, unusual tissue above cords; ? Candida, small HH;.  Pt then saw ENT Pollyann Kennedy as an OP , "indirect exam reveals healthy, mobile vocal cords, without mucosal lesions  in the hypopharynx or larynx", dysphagia had resolved per ENT notes.    Assessment / Plan / Recommendation  Clinical Impression  Patient presents with functional oropharyngeal swallow ability - no indication of aspiration across all po intake. No focal CN deficits apparent and pt easily passed 3 ounce Yale water challenge. SLP observed pt consuming crackers x4, applesauce, and thin. Swallow and respiration coordination is adequate with expiratory pattern post-swallow.   Recommend regular/thin diet - with general aspiration precautions.    SLP Visit Diagnosis: Dysphagia, oral phase (R13.11)    Aspiration Risk  Mild aspiration risk    Diet Recommendation Regular;Thin liquid   Liquid Administration via: Cup;Straw Medication Administration: Other (Comment) (as tolerated) Supervision: Patient able to self feed Compensations: Slow rate;Small sips/bites Postural Changes: Remain upright for at least 30 minutes after po intake;Seated upright at 90 degrees    Other  Recommendations Oral Care Recommendations: Oral care BID    Recommendations for follow up therapy are one component of a multi-disciplinary discharge planning process, led by the attending physician.  Recommendations may be updated based on patient status, additional functional criteria and insurance authorization.  Follow up Recommendations No SLP follow up      Assistance Recommended at Discharge  N/A  Functional Status Assessment Patient has not had a recent decline in their functional status  Frequency and Duration            Prognosis    N/a    Swallow Study   General Date of Onset: 06/19/22 HPI: Pt  is an 89 yo male adm to [facility] with respiratory deficits.  PMH + for diabetes mellitus, COPD on 2 L nasal cannula at baseline, hypertension, hyperlipidemia, GERD and dysphagia. Workup concerning for right lower lobe pneumonia   history of dysphagia noted with prior esophagram and endoscopy completed.  DG esoph 2023 conducted  due to globus, dysphagia;  Findings showed Possible filling defect along the PEJ - advised endoscopy to assess the pharyngo, Small type 1 hiatal hernia, ? Esophagitis.: Endoscopy.  01/2021 asymmetric pharynx, unusual tissue above cords; ? Candida, small HH;.  Pt then saw ENT Pollyann Kennedy as an OP , "indirect exam reveals healthy, mobile vocal cords, without mucosal lesions in the hypopharynx or larynx", dysphagia had resolved per ENT notes. Type of Study: Bedside Swallow Evaluation Previous Swallow Assessment: see HPI Diet Prior to this Study: Regular;Thin liquids (Level 0) Temperature Spikes Noted: No Respiratory Status: Nasal cannula History of Recent Intubation: No Behavior/Cognition: Alert;Cooperative;Pleasant mood Oral Cavity Assessment: Within Functional Limits Oral Care Completed by SLP: No Oral Cavity - Dentition: Dentures, bottom;Dentures, top Vision: Functional for self-feeding Self-Feeding Abilities: Able to feed self Patient Positioning: Upright in chair Baseline Vocal Quality: Normal Volitional Cough: Strong Volitional Swallow: Able to elicit    Oral/Motor/Sensory Function Overall Oral Motor/Sensory Function: Within functional limits (? minimal left facial asymmetry)   Ice Chips Ice chips: Not tested   Thin Liquid Thin Liquid: Within functional limits Presentation: Self Fed;Cup;Straw    Nectar Thick Nectar Thick Liquid: Not tested   Honey Thick Honey Thick Liquid: Not tested   Puree Puree: Within functional limits Presentation: Self Fed;Spoon   Solid     Solid: Within functional limits Presentation: Self Fed;Spoon      Chales Abrahams 06/19/2022,4:17 PM  Rolena Infante, MS [facility] SLP Acute Rehab Services Office [phone]

## 2022-06-19 NOTE — Progress Notes (Addendum)
PROGRESS NOTE    [name]  ZOX:[id number] DOB: November 26, 1932 DOA: 06/18/2022 PCP: Pearline Cables, MD  Chief Complaint  Patient presents with   Shortness of Breath    Brief Narrative:   Johnathan [name] is [name] 89 y.o. male with medical history significant for COPD, chronic respiratory failure with hypoxia on 2 L O2 via [place], T2DM, HTN, HLD, GERD who presented to the ED for evaluation of fatigue and cough.   Found to have evidence of CAP on CXR, currently admitted and being treated for acute on chronic hypoxic respiratory failure due to pneumonia and COPD exacerbation.   Assessment & Plan:   Principal Problem:   Severe sepsis (HCC) Active Problems:   Community acquired pneumonia of right lower lobe of lung   COPD with acute exacerbation (HCC)   Type 2 diabetes mellitus (HCC)   Hypertension associated with diabetes (HCC)   Acute on chronic respiratory failure with hypoxia (HCC)   Hyperlipidemia associated with type 2 diabetes mellitus (HCC)  Acute on Chronic Hypoxic Respiratory Failure Severe Sepsis due to Community Acquired Pneumonia COPD with acute Exacerbation  Met criteria for severe sepsis on admission Chronically on 2 L for his COPD CXR with right basilar opacity -> needs follow up imaging outpatient to ensure resolution  Negative covid, influenza, RSV Negative MRSA PCR RVP pending Sputum culture pending collection Blood cultures pending Pending urine strep, urine legionella Continue ceftriaxone, azithromycin Steroids, scheduled/prn nebs (takes trelegy at home) Wean O2 as tolerated Strict I/O, daily weights  T2DM Holding metformin Follow A1c SSI - BG's worse with steroids, follow Continue statin, aspirin  HTN Hold amlodipine and lasix (reportedly not taking this)  Essential Tremor Continue primidone  HLD Continue statin, aspirin  GERD Prilosec    DVT prophylaxis: lovenox Code Status: DNR, ok with intubation Family Communication: none Disposition:    Status is: Inpatient Remains inpatient appropriate because: need for continued IV therapies, supplemental oxygen   Consultants:  none  Procedures:  none  Antimicrobials:  Anti-infectives (From admission, onward)    Start     Dose/Rate Route Frequency Ordered Stop   06/18/22 1245  cefTRIAXone (ROCEPHIN) 2 g in sodium chloride 0.9 % 100 mL IVPB        2 g 200 mL/hr over 30 Minutes Intravenous Every 24 hours 06/18/22 1242 06/23/22 1244   06/18/22 1245  azithromycin (ZITHROMAX) 500 mg in sodium chloride 0.9 % 250 mL IVPB        500 mg 250 mL/hr over 60 Minutes Intravenous Every 24 hours 06/18/22 1242 06/23/22 1244       Subjective: Trouble sleeping last night  Objective: Vitals:   06/19/22 0719 06/19/22 0728 06/19/22 0759 06/19/22 0800  BP:    (!) 153/74  Pulse: 80 80  87  Resp: (!) 21 20  (!) 28  Temp:      TempSrc:      SpO2: 100% 99% 99% 93%  Weight:      Height:        Intake/Output Summary (Last 24 hours) at 06/19/2022 0836 Last data filed at 06/19/2022 0700 Gross per 24 hour  Intake 5935.56 ml  Output 1700 ml  Net 4235.56 ml   Filed Weights   06/18/22 1210  Weight: 66.2 kg    Examination:  General exam: Appears calm and comfortable  Respiratory system: poor air movement, diffuse rhonchi - on 4-5 L [place] Cardiovascular system: S1 & S2 heard, RRR. Gastrointestinal system: Abdomen is nondistended, soft and nontender. No  organomegaly or masses felt. Normal bowel sounds heard. Central nervous system: Alert and oriented. No focal neurological deficits. Extremities: no LEE    Data Reviewed: I have personally reviewed following labs and imaging studies  CBC: Recent Labs  Lab 06/18/22 1248 06/18/22 1255 06/19/22 0306  WBC 9.7  --  10.9*  NEUTROABS 7.8*  --   --   HGB 14.4 13.9 12.1*  HCT 43.5 41.0 36.3*  MCV 95.8  --  96.3  PLT 151  --  132*    Basic Metabolic Panel: Recent Labs  Lab 06/18/22 1248 06/18/22 1255 06/19/22 0306  NA 135 136 135   K 4.4 5.4* 4.4  CL 98  --  102  CO2 25  --  26  GLUCOSE 141*  --  211*  BUN 15  --  17  CREATININE 1.02  --  0.88  CALCIUM 9.2  --  8.8*    GFR: Estimated Creatinine Clearance: 55.4 mL/min (by C-G formula based on SCr of 0.88 mg/dL).  Liver Function Tests: Recent Labs  Lab 06/18/22 1248  AST 24  ALT 15  ALKPHOS 92  BILITOT 0.8  PROT 8.5*  ALBUMIN 4.2    CBG: Recent Labs  Lab 06/18/22 2156 06/19/22 0724  GLUCAP 260* 178*     Recent Results (from the past 240 hour(s))  Culture, blood (routine x 2)     Status: None (Preliminary result)   Collection Time: 06/18/22 12:30 PM   Specimen: BLOOD  Result Value Ref Range Status   Specimen Description   Final    BLOOD RIGHT ANTECUBITAL Performed at [facility], [address] [place], [place] [address]    Special Requests   Final    BOTTLES DRAWN AEROBIC AND ANAEROBIC Blood Culture results may not be optimal due to an inadequate volume of blood received in culture bottles Performed at [facility], [address] [place], [place] [address]    Culture   Final    NO GROWTH < 24 HOURS Performed at [facility], [address], [place], [place] [address]    Report Status PENDING  Incomplete  Culture, blood (routine x 2)     Status: None (Preliminary result)   Collection Time: 06/18/22 12:35 PM   Specimen: BLOOD  Result Value Ref Range Status   Specimen Description   Final    BLOOD LEFT ANTECUBITAL Performed at [facility], [address] [place], [place] [address]    Special Requests   Final    BOTTLES DRAWN AEROBIC AND ANAEROBIC Blood Culture adequate volume Performed at [facility], [address] [place], [place] [address]    Culture   Final    NO GROWTH < 24 HOURS Performed at [facility], [address], [place], [place] [address]    Report Status PENDING  Incomplete  Resp panel by RT-PCR (RSV, Flu Oliverio Cho&B, Covid) Anterior Nasal Swab     Status:  None   Collection Time: 06/18/22 12:51 PM   Specimen: Anterior Nasal Swab  Result Value Ref Range Status   SARS Coronavirus 2 by RT PCR NEGATIVE NEGATIVE Final    Comment: (NOTE) SARS-CoV-2 target nucleic acids are NOT DETECTED.  The SARS-CoV-2 RNA is generally detectable in upper respiratory specimens during the acute phase of infection. The lowest concentration of SARS-CoV-2 viral copies this assay can detect is 138 copies/mL. Nigel Ericsson negative result does not preclude SARS-Cov-2 infection and  should not be used as the sole basis for treatment or other patient management decisions. Hildy Nicholl negative result may occur with  improper specimen collection/handling, submission of specimen other than nasopharyngeal swab, presence of viral mutation(s) within the areas targeted by this assay, and inadequate number of viral copies(<138 copies/mL). Rita Vialpando negative result must be combined with clinical observations, patient history, and epidemiological information. The expected result is Negative.  Fact Sheet for Patients:  BloggerCourse.com  Fact Sheet for Healthcare Providers:  SeriousBroker.it  This test is no t yet approved or cleared by the [place] FDA and  has been authorized for detection and/or diagnosis of SARS-CoV-2 by FDA under an Emergency Use Authorization (EUA). This EUA will remain  in effect (meaning this test can be used) for the duration of the COVID-19 declaration under Section 564(b)(1) of the Act, 21 U.S.C.section 360bbb-3(b)(1), unless the authorization is terminated  or revoked sooner.       Influenza Aixa Corsello by PCR NEGATIVE NEGATIVE Final   Influenza B by PCR NEGATIVE NEGATIVE Final    Comment: (NOTE) The Xpert Xpress SARS-CoV-2/FLU/RSV plus assay is intended as an aid in the diagnosis of influenza from Nasopharyngeal swab specimens and should not be used as Apple Dearmas sole basis for treatment. Nasal washings and aspirates are unacceptable  for Xpert Xpress SARS-CoV-2/FLU/RSV testing.  Fact Sheet for Patients: BloggerCourse.com  Fact Sheet for Healthcare Providers: SeriousBroker.it  This test is not yet approved or cleared by the [place] FDA and has been authorized for detection and/or diagnosis of SARS-CoV-2 by FDA under an Emergency Use Authorization (EUA). This EUA will remain in effect (meaning this test can be used) for the duration of the COVID-19 declaration under Section 564(b)(1) of the Act, 21 U.S.C. section 360bbb-3(b)(1), unless the authorization is terminated or revoked.     Resp Syncytial Virus by PCR NEGATIVE NEGATIVE Final    Comment: (NOTE) Fact Sheet for Patients: BloggerCourse.com  Fact Sheet for Healthcare Providers: SeriousBroker.it  This test is not yet approved or cleared by the [place] FDA and has been authorized for detection and/or diagnosis of SARS-CoV-2 by FDA under an Emergency Use Authorization (EUA). This EUA will remain in effect (meaning this test can be used) for the duration of the COVID-19 declaration under Section 564(b)(1) of the Act, 21 U.S.C. section 360bbb-3(b)(1), unless the authorization is terminated or revoked.  Performed at [facility], [address] [place], [place] [address]   MRSA Next Gen by PCR, Nasal     Status: None   Collection Time: 06/18/22  7:12 PM   Specimen: Nasal Mucosa; Nasal Swab  Result Value Ref Range Status   MRSA by PCR Next Gen NOT DETECTED NOT DETECTED Final    Comment: (NOTE) The GeneXpert MRSA Assay (FDA approved for NASAL specimens only), is one component of Keiarra Charon comprehensive MRSA colonization surveillance program. It is not intended to diagnose MRSA infection nor to guide or monitor treatment for MRSA infections. Test performance is not FDA approved in patients less than 71 years old. Performed at [facility], [address], [place], [place] [address]          Radiology Studies: [facility] Chest Port 1 View  Result Date: 06/18/2022 CLINICAL DATA:  Shortness of breath, cough. EXAM: PORTABLE CHEST 1 VIEW COMPARISON:  March 05, 2022. FINDINGS: The heart size and mediastinal contours are within normal limits. Left lung is clear. Interval development of right basilar opacity concerning for pneumonia. The visualized skeletal structures  are unremarkable. IMPRESSION: Right basilar opacity is noted concerning for pneumonia. Followup PA and lateral chest X-ray is recommended in 3-4 weeks following trial of antibiotic therapy to ensure resolution and exclude underlying malignancy. Electronically Signed   By: Lupita Raider M.D.   On: 06/18/2022 13:27        Scheduled Meds:  arformoterol  15 mcg Nebulization BID   aspirin EC  81 mg Oral q morning   budesonide (PULMICORT) nebulizer solution  0.25 mg Nebulization BID   Chlorhexidine Gluconate Cloth  6 each Topical Daily   enoxaparin (LOVENOX) injection  40 mg Subcutaneous QHS   guaiFENesin  600 mg Oral BID   insulin aspart  0-5 Units Subcutaneous QHS   insulin aspart  0-9 Units Subcutaneous TID WC   methylPREDNISolone (SOLU-MEDROL) injection  40 mg Intravenous Q12H   pantoprazole  40 mg Oral BID AC   pravastatin  20 mg Oral Daily   primidone  50 mg Oral BID   sodium chloride flush  3 mL Intravenous Q12H   Continuous Infusions:  sodium chloride Stopped (06/18/22 1657)   azithromycin Stopped (06/18/22 1657)   cefTRIAXone (ROCEPHIN)  IV Stopped (06/18/22 1408)     LOS: 1 day    Time spent: over 30 min    Lacretia Nicks, MD [facility]   To contact the attending provider between 7A-7P or the covering provider during after hours 7P-7A, please log into the web site [email or web address] and access using universal [place] password for that web site. If you do not have the password, please call the hospital  operator.  06/19/2022, 8:36 AM

## 2022-06-20 DIAGNOSIS — A419 Sepsis, unspecified organism: Secondary | ICD-10-CM | POA: Diagnosis not present

## 2022-06-20 DIAGNOSIS — R6520 Severe sepsis without septic shock: Secondary | ICD-10-CM | POA: Diagnosis not present

## 2022-06-20 LAB — COMPREHENSIVE METABOLIC PANEL
ALT: 16 U/L (ref 0–44)
AST: 16 U/L (ref 15–41)
Albumin: 3.2 g/dL — ABNORMAL LOW (ref 3.5–5.0)
Alkaline Phosphatase: 59 U/L (ref 38–126)
Anion gap: 7 (ref 5–15)
BUN: 21 mg/dL (ref 8–23)
CO2: 25 mmol/L (ref 22–32)
Calcium: 8.6 mg/dL — ABNORMAL LOW (ref 8.9–10.3)
Chloride: 102 mmol/L (ref 98–111)
Creatinine, Ser: 0.98 mg/dL (ref 0.61–1.24)
GFR, Estimated: >60 mL/min (ref >60)
Glucose, Bld: 186 mg/dL — ABNORMAL HIGH (ref 70–99)
Potassium: 4.3 mmol/L (ref 3.5–5.1)
Sodium: 134 mmol/L — ABNORMAL LOW (ref 135–145)
Total Bilirubin: 0.2 mg/dL — ABNORMAL LOW (ref 0.3–1.2)
Total Protein: 7.1 g/dL (ref 6.5–8.1)

## 2022-06-20 LAB — CBC WITH DIFFERENTIAL/PLATELET
Abs Immature Granulocytes: 0.06 10*3/uL (ref 0.00–0.07)
Basophils Absolute: 0 10*3/uL (ref 0.0–0.1)
Basophils Relative: 0 %
Eosinophils Absolute: 0 10*3/uL (ref 0.0–0.5)
Eosinophils Relative: 0 %
HCT: 35.3 % — ABNORMAL LOW (ref 39.0–52.0)
Hemoglobin: 11.3 g/dL — ABNORMAL LOW (ref 13.0–17.0)
Immature Granulocytes: 1 %
Lymphocytes Relative: 10 %
Lymphs Abs: 0.8 10*3/uL (ref 0.7–4.0)
MCH: 31.6 pg (ref 26.0–34.0)
MCHC: 32 g/dL (ref 30.0–36.0)
MCV: 98.6 fL (ref 80.0–100.0)
Monocytes Absolute: 0.6 10*3/uL (ref 0.1–1.0)
Monocytes Relative: 7 %
Neutro Abs: 6.4 10*3/uL (ref 1.7–7.7)
Neutrophils Relative %: 82 %
Platelets: 141 10*3/uL — ABNORMAL LOW (ref 150–400)
RBC: 3.58 MIL/uL — ABNORMAL LOW (ref 4.22–5.81)
RDW: 13.7 % (ref 11.5–15.5)
WBC: 7.9 10*3/uL (ref 4.0–10.5)
nRBC: 0 % (ref 0.0–0.2)

## 2022-06-20 LAB — CULTURE, BLOOD (ROUTINE X 2)

## 2022-06-20 LAB — LEGIONELLA PNEUMOPHILA SEROGP 1 UR AG: L. pneumophila Serogp 1 Ur Ag: NEGATIVE

## 2022-06-20 LAB — BRAIN NATRIURETIC PEPTIDE: B Natriuretic Peptide: 302.3 pg/mL — ABNORMAL HIGH (ref 0.0–100.0)

## 2022-06-20 LAB — MAGNESIUM: Magnesium: 2.2 mg/dL (ref 1.7–2.4)

## 2022-06-20 LAB — CULTURE, RESPIRATORY W GRAM STAIN

## 2022-06-20 LAB — GLUCOSE, CAPILLARY
Glucose-Capillary: 189 mg/dL — ABNORMAL HIGH (ref 70–99)
Glucose-Capillary: 191 mg/dL — ABNORMAL HIGH (ref 70–99)
Glucose-Capillary: 230 mg/dL — ABNORMAL HIGH (ref 70–99)
Glucose-Capillary: 162 mg/dL — ABNORMAL HIGH (ref 70–99)

## 2022-06-20 LAB — EXPECTORATED SPUTUM ASSESSMENT W GRAM STAIN, RFLX TO RESP C

## 2022-06-20 LAB — PHOSPHORUS: Phosphorus: 2.3 mg/dL — ABNORMAL LOW (ref 2.5–4.6)

## 2022-06-20 MED ORDER — AZITHROMYCIN 250 MG PO TABS
500.0000 mg | ORAL_TABLET | Freq: Every day | ORAL | Status: AC
  Administered 2022-06-20 – 2022-06-22 (×3): 500 mg via ORAL
  Filled 2022-06-20 (×3): qty 2

## 2022-06-20 MED ORDER — FUROSEMIDE 40 MG PO TABS
40.0000 mg | ORAL_TABLET | Freq: Once | ORAL | Status: AC
  Administered 2022-06-20: 40 mg via ORAL
  Filled 2022-06-20: qty 1

## 2022-06-20 MED ORDER — PREDNISONE 20 MG PO TABS
40.0000 mg | ORAL_TABLET | Freq: Every day | ORAL | Status: DC
  Administered 2022-06-20 – 2022-06-22 (×3): 40 mg via ORAL
  Filled 2022-06-20 (×3): qty 2

## 2022-06-20 MED ORDER — AMLODIPINE BESYLATE 5 MG PO TABS
5.0000 mg | ORAL_TABLET | Freq: Every day | ORAL | Status: DC
  Administered 2022-06-20 – 2022-06-24 (×5): 5 mg via ORAL
  Filled 2022-06-20 (×5): qty 1

## 2022-06-20 MED ORDER — BENZONATATE 100 MG PO CAPS
100.0000 mg | ORAL_CAPSULE | Freq: Three times a day (TID) | ORAL | Status: DC | PRN
  Administered 2022-06-20 – 2022-06-23 (×4): 100 mg via ORAL
  Filled 2022-06-20 (×4): qty 1

## 2022-06-20 NOTE — Progress Notes (Addendum)
PROGRESS NOTE    Johnathan [name]  ZHY:[id number] DOB: 1932-10-16 DOA: 06/18/2022 PCP: Pearline Cables, MD  Chief Complaint  Patient presents with   Shortness of Breath    Brief Narrative:   Johnathan [name] is Johnathan [name] 89 y.o. male with medical history significant for COPD, chronic respiratory failure with hypoxia on 2 L O2 via [place], T2DM, HTN, HLD, GERD who presented to the ED for evaluation of fatigue and cough.   Found to have evidence of CAP on CXR, currently admitted and being treated for acute on chronic hypoxic respiratory failure due to pneumonia and COPD exacerbation.   Assessment & Plan:   Principal Problem:   Severe sepsis (HCC) Active Problems:   Community acquired pneumonia of right lower lobe of lung   COPD with acute exacerbation (HCC)   Type 2 diabetes mellitus (HCC)   Hypertension associated with diabetes (HCC)   Acute on chronic respiratory failure with hypoxia (HCC)   Hyperlipidemia associated with type 2 diabetes mellitus (HCC)  Acute on Chronic Hypoxic Respiratory Failure Severe Sepsis due to Community Acquired Pneumonia COPD with acute Exacerbation  Rhinovirus infection Met criteria for severe sepsis on admission Chronically on 2 L for his COPD CXR with right basilar opacity -> needs follow up imaging outpatient to ensure resolution  Negative covid, influenza, RSV Negative MRSA PCR RVP with rhinovirus  Sputum culture pending collection Blood cultures pending urine strep negative, urine legionella pending Continue ceftriaxone, azithromycin Steroids, scheduled/prn nebs (takes trelegy at home) Wean O2 as tolerated Strict I/O, daily weights - net positive, not overloaded, will dose lasix with goal net even   NSVT Continue tele  T2DM Holding metformin Follow A1c 6.6 SSI - BG's worse with steroids, follow. Should improve once steroids d/c'd. Continue statin, aspirin  HTN Hold amlodipine and lasix (reportedly not taking this)  Essential Tremor Continue  primidone  HLD Continue statin, aspirin  GERD Prilosec    DVT prophylaxis: lovenox Code Status: DNR, ok with intubation Family Communication: none Disposition:   Status is: Inpatient Remains inpatient appropriate because: need for continued IV therapies, supplemental oxygen   Consultants:  none  Procedures:  none  Antimicrobials:  Anti-infectives (From admission, onward)    Start     Dose/Rate Route Frequency Ordered Stop   06/18/22 1245  cefTRIAXone (ROCEPHIN) 2 g in sodium chloride 0.9 % 100 mL IVPB        2 g 200 mL/hr over 30 Minutes Intravenous Every 24 hours 06/18/22 1242 06/23/22 1244   06/18/22 1245  azithromycin (ZITHROMAX) 500 mg in sodium chloride 0.9 % 250 mL IVPB        500 mg 250 mL/hr over 60 Minutes Intravenous Every 24 hours 06/18/22 1242 06/23/22 1244       Subjective: Slept better, no new complaints  Objective: Vitals:   06/20/22 0554 06/20/22 0600 06/20/22 0700 06/20/22 0800  BP:  (!) 169/77  (!) 134/96  Pulse: 78 79 79 81  Resp: 19 18 19 18   Temp:    97.9 F (36.6 C)  TempSrc:    Oral  SpO2: 95% 95% 95% 96%  Weight: 69.9 kg     Height:        Intake/Output Summary (Last 24 hours) at 06/20/2022 0838 Last data filed at 06/20/2022 0547 Gross per 24 hour  Intake 1348.55 ml  Output 2800 ml  Net -1451.45 ml   Filed Weights   06/18/22 1210 06/20/22 0554  Weight: 66.2 kg 69.9 kg    Examination:  General: No acute distress. Cardiovascular: RRR Lungs: continued diffuse coarse breath sounds, coarse cough  Neurological: Alert and oriented 3. Moves all extremities 4 with equal strength. Cranial nerves II through XII grossly intact. Extremities: No clubbing or cyanosis. No edema.  Data Reviewed: I have personally reviewed following labs and imaging studies  CBC: Recent Labs  Lab 06/18/22 1248 06/18/22 1255 06/19/22 0306 06/20/22 0313  WBC 9.7  --  10.9* 7.9  NEUTROABS 7.8*  --   --  6.4  HGB 14.4 13.9 12.1* 11.3*  HCT 43.5  41.0 36.3* 35.3*  MCV 95.8  --  96.3 98.6  PLT 151  --  132* 141*    Basic Metabolic Panel: Recent Labs  Lab 06/18/22 1248 06/18/22 1255 06/19/22 0306 06/20/22 0313  NA 135 136 135 134*  K 4.4 5.4* 4.4 4.3  CL 98  --  102 102  CO2 25  --  26 25  GLUCOSE 141*  --  211* 186*  BUN 15  --  17 21  CREATININE 1.02  --  0.88 0.98  CALCIUM 9.2  --  8.8* 8.6*  MG  --   --   --  2.2  PHOS  --   --   --  2.3*    GFR: Estimated Creatinine Clearance: 52.5 mL/min (by C-G formula based on SCr of 0.98 mg/dL).  Liver Function Tests: Recent Labs  Lab 06/18/22 1248 06/20/22 0313  AST 24 16  ALT 15 16  ALKPHOS 92 59  BILITOT 0.8 0.2*  PROT 8.5* 7.1  ALBUMIN 4.2 3.2*    CBG: Recent Labs  Lab 06/19/22 0724 06/19/22 1143 06/19/22 1616 06/19/22 2056 06/20/22 0755  GLUCAP 178* 146* 260* 268* 189*     Recent Results (from the past 240 hour(s))  Culture, blood (routine x 2)     Status: None (Preliminary result)   Collection Time: 06/18/22 12:30 PM   Specimen: BLOOD  Result Value Ref Range Status   Specimen Description   Final    BLOOD RIGHT ANTECUBITAL Performed at [facility], [address] [place], [place] [address]    Special Requests   Final    BOTTLES DRAWN AEROBIC AND ANAEROBIC Blood Culture results may not be optimal due to an inadequate volume of blood received in culture bottles Performed at [facility], [address] [place], [place] [address]    Culture   Final    NO GROWTH 2 DAYS Performed at [facility], [address] [place], [place] [address]    Report Status PENDING  Incomplete  Culture, blood (routine x 2)     Status: None (Preliminary result)   Collection Time: 06/18/22 12:35 PM   Specimen: BLOOD  Result Value Ref Range Status   Specimen Description   Final    BLOOD LEFT ANTECUBITAL Performed at [facility], [address] [place], [place] [address]    Special Requests   Final    BOTTLES DRAWN  AEROBIC AND ANAEROBIC Blood Culture adequate volume Performed at [facility], [address]  [place], [place] [address]    Culture   Final    NO GROWTH 2 DAYS Performed at [facility], [address] [place], [place] [address]    Report Status PENDING  Incomplete  Resp panel by RT-PCR (RSV, Flu Ysabella Babiarz&B, Covid) Anterior Nasal Swab     Status: None   Collection Time: 06/18/22 12:51 PM  Specimen: Anterior Nasal Swab  Result Value Ref Range Status   SARS Coronavirus 2 by RT PCR NEGATIVE NEGATIVE Final    Comment: (NOTE) SARS-CoV-2 target nucleic acids are NOT DETECTED.  The SARS-CoV-2 RNA is generally detectable in upper respiratory specimens during the acute phase of infection. The lowest concentration of SARS-CoV-2 viral copies this assay can detect is 138 copies/mL. Chares Slaymaker negative result does not preclude SARS-Cov-2 infection and should not be used as the sole basis for treatment or other patient management decisions. Tanis Burnley negative result may occur with  improper specimen collection/handling, submission of specimen other than nasopharyngeal swab, presence of viral mutation(s) within the areas targeted by this assay, and inadequate number of viral copies(<138 copies/mL). Ariadne Rissmiller negative result must be combined with clinical observations, patient history, and epidemiological information. The expected result is Negative.  Fact Sheet for Patients:  BloggerCourse.com  Fact Sheet for Healthcare Providers:  SeriousBroker.it  This test is no t yet approved or cleared by the [place] FDA and  has been authorized for detection and/or diagnosis of SARS-CoV-2 by FDA under an Emergency Use Authorization (EUA). This EUA will remain  in effect (meaning this test can be used) for the duration of the COVID-19 declaration under Section 564(b)(1) of the Act, 21 U.S.C.section 360bbb-3(b)(1), unless the authorization is terminated  or  revoked sooner.       Influenza Louiza Moor by PCR NEGATIVE NEGATIVE Final   Influenza B by PCR NEGATIVE NEGATIVE Final    Comment: (NOTE) The Xpert Xpress SARS-CoV-2/FLU/RSV plus assay is intended as an aid in the diagnosis of influenza from Nasopharyngeal swab specimens and should not be used as Alvenia Treese sole basis for treatment. Nasal washings and aspirates are unacceptable for Xpert Xpress SARS-CoV-2/FLU/RSV testing.  Fact Sheet for Patients: BloggerCourse.com  Fact Sheet for Healthcare Providers: SeriousBroker.it  This test is not yet approved or cleared by the [place] FDA and has been authorized for detection and/or diagnosis of SARS-CoV-2 by FDA under an Emergency Use Authorization (EUA). This EUA will remain in effect (meaning this test can be used) for the duration of the COVID-19 declaration under Section 564(b)(1) of the Act, 21 U.S.C. section 360bbb-3(b)(1), unless the authorization is terminated or revoked.     Resp Syncytial Virus by PCR NEGATIVE NEGATIVE Final    Comment: (NOTE) Fact Sheet for Patients: BloggerCourse.com  Fact Sheet for Healthcare Providers: SeriousBroker.it  This test is not yet approved or cleared by the [place] FDA and has been authorized for detection and/or diagnosis of SARS-CoV-2 by FDA under an Emergency Use Authorization (EUA). This EUA will remain in effect (meaning this test can be used) for the duration of the COVID-19 declaration under Section 564(b)(1) of the Act, 21 U.S.C. section 360bbb-3(b)(1), unless the authorization is terminated or revoked.  Performed at [facility], [address] [place], [place] [address]   MRSA Next Gen by PCR, Nasal     Status: None   Collection Time: 06/18/22  7:12 PM   Specimen: Nasal Mucosa; Nasal Swab  Result Value Ref Range Status   MRSA by PCR Next Gen NOT DETECTED NOT DETECTED Final     Comment: (NOTE) The GeneXpert MRSA Assay (FDA approved for NASAL specimens only), is one component of Isidro Monks comprehensive MRSA colonization surveillance program. It is not intended to diagnose MRSA infection nor to guide or monitor treatment for MRSA infections. Test performance is not FDA approved in patients less than 75 years old. Performed at [facility], [address] [place], [place] [address]   Respiratory (~20 pathogens) panel by PCR     Status: Abnormal   Collection Time: 06/19/22  8:34 AM   Specimen: Nasopharyngeal Swab; Respiratory  Result Value Ref Range Status   Adenovirus NOT DETECTED NOT DETECTED Final   Coronavirus 229E NOT DETECTED NOT DETECTED Final    Comment: (NOTE) The Coronavirus on the Respiratory Panel, DOES NOT test for the novel  Coronavirus (2019 nCoV)    Coronavirus HKU1 NOT DETECTED NOT DETECTED Final   Coronavirus NL63 NOT DETECTED NOT DETECTED Final   Coronavirus OC43 NOT DETECTED NOT DETECTED Final   Metapneumovirus NOT DETECTED NOT DETECTED Final   Rhinovirus / Enterovirus DETECTED (Vallerie Hentz) NOT DETECTED Final   Influenza Britanny Marksberry NOT DETECTED NOT DETECTED Final   Influenza B NOT DETECTED NOT DETECTED Final   Parainfluenza Virus 1 NOT DETECTED NOT DETECTED Final   Parainfluenza Virus 2 NOT DETECTED NOT DETECTED Final   Parainfluenza Virus 3 NOT DETECTED NOT DETECTED Final   Parainfluenza Virus 4 NOT DETECTED NOT DETECTED Final   Respiratory Syncytial Virus NOT DETECTED NOT DETECTED Final   Bordetella pertussis NOT DETECTED NOT DETECTED Final   Bordetella Parapertussis NOT DETECTED NOT DETECTED Final   Chlamydophila pneumoniae NOT DETECTED NOT DETECTED Final   Mycoplasma pneumoniae NOT DETECTED NOT DETECTED Final    Comment: Performed at [facility], [address], [place], [place] [address]         Radiology Studies: DG Chest Port 1 View  Result Date: 06/18/2022 CLINICAL DATA:  Shortness of breath, cough. EXAM: PORTABLE  CHEST 1 VIEW COMPARISON:  March 05, 2022. FINDINGS: The heart size and mediastinal contours are within normal limits. Left lung is clear. Interval development of right basilar opacity concerning for pneumonia. The visualized skeletal structures are unremarkable. IMPRESSION: Right basilar opacity is noted concerning for pneumonia. Followup PA and lateral chest X-ray is recommended in 3-4 weeks following trial of antibiotic therapy to ensure resolution and exclude underlying malignancy. Electronically Signed   By: Lupita Raider M.D.   On: 06/18/2022 13:27        Scheduled Meds:  amLODipine  5 mg Oral Daily   arformoterol  15 mcg Nebulization BID   aspirin EC  81 mg Oral q morning   budesonide (PULMICORT) nebulizer solution  0.25 mg Nebulization BID   Chlorhexidine Gluconate Cloth  6 each Topical Daily   enoxaparin (LOVENOX) injection  40 mg Subcutaneous QHS   guaiFENesin  600 mg Oral BID   insulin aspart  0-5 Units Subcutaneous QHS   insulin aspart  0-9 Units Subcutaneous TID WC   ipratropium-albuterol  3 mL Nebulization Q6H WA   pantoprazole  40 mg Oral BID AC   pravastatin  20 mg Oral Daily   predniSONE  40 mg Oral Q breakfast   primidone  50 mg Oral BID   sodium chloride flush  3 mL Intravenous Q12H   Continuous Infusions:  sodium chloride Stopped (06/18/22 1657)   azithromycin Stopped (06/19/22 1430)   cefTRIAXone (ROCEPHIN)  IV Stopped (06/19/22 1250)     LOS: 2 days    Time spent: over 30 min    Lacretia Nicks, MD [facility]   To contact the attending provider between 7A-7P or the covering provider during after hours 7P-7A, please log into the web site [email or web address] and access using universal Wall password for that web site. If you do not have the password, please call the hospital  operator.  06/20/2022, 8:38 AM

## 2022-06-20 NOTE — TOC Initial Note (Signed)
Transition of Care [facility]) - Initial/Assessment Note    Patient Details  Name: Johnathan [name] MRN: [id number] Date of Birth: [birth year]  Transition of Care [facility]) CM/SW Contact:    Otelia Santee, LCSW Phone Number: 06/20/2022, 11:43 AM  Clinical Narrative:                 Spoke with pt via t/c to room. Pt shares he currently resides in a townhouse with a friend. He is chronically on 2L O2 and receives oxygen through [employer]. Pt is currently receiving OPPT at rehab office in [facility]. CSW discussed recommendation for [facility]. Pt currently declining home health and states he prefers to continue with OPPT. Pt denies DME needs.  TOC will continue to follow for further needs,   Expected Discharge Plan: OP Rehab Barriers to Discharge: No Barriers Identified   Patient Goals and CMS Choice Patient states their goals for this hospitalization and ongoing recovery are:: To return home CMS Medicare.gov Compare Post Acute Care list provided to:: Patient Choice offered to / list presented to : Patient [place] ownership interest in [facility].provided to:: Patient    Expected Discharge Plan and Services In-house Referral: Clinical Social Work Discharge Planning Services: NA Post Acute Care Choice: Resumption of Svcs/PTA Provider Living arrangements for the past 2 months: Apartment                 DME Arranged: N/A DME Agency: NA                  Prior Living Arrangements/Services Living arrangements for the past 2 months: Apartment Lives with:: Friends Patient language and need for interpreter reviewed:: Yes Do you feel safe going back to the place where you live?: Yes      Need for Family Participation in Patient Care: No (Comment) Care giver support system in place?: No (comment) Current home services: DME (Cane, RW, 2L O2 through [employer]) Criminal Activity/Legal Involvement Pertinent to Current Situation/Hospitalization: No - Comment as needed  Activities of Daily  Living      Permission Sought/Granted   Permission granted to share information with : No              Emotional Assessment   Attitude/Demeanor/Rapport: Engaged Affect (typically observed): Pleasant Orientation: : Oriented to Self, Oriented to Place, Oriented to  Time, Oriented to Situation Alcohol / Substance Use: Not Applicable Psych Involvement: No (comment)  Admission diagnosis:  COPD exacerbation (HCC) [J44.1] Severe sepsis (HCC) [A41.9, R65.20] Community acquired pneumonia, unspecified laterality [J18.9] Patient Active Problem List   Diagnosis Date Noted   Severe sepsis (HCC) 06/18/2022   Hyperlipidemia associated with type 2 diabetes mellitus (HCC) 06/18/2022   Preop pulmonary/respiratory exam 04/09/2022   GERD (gastroesophageal reflux disease) 09/11/2021   Acute on chronic respiratory failure with hypoxia (HCC) 09/10/2021   Interstitial pulmonary disease (HCC) 03/19/2021   Hypertension associated with diabetes (HCC) 03/19/2021   Oral candidiasis 03/05/2021   Chronic respiratory failure with hypoxia (HCC) 03/05/2021   Trigger finger, right ring finger 02/13/2021   COPD with chronic bronchitis and emphysema (HCC) 04/26/2020   COVID-19 02/25/2020   Shingles 11/14/2019   Raynaud's syndrome 11/14/2019   Skin cancer 11/14/2019   Left inguinal hernia s/p lap repair w mesh 05/06/2019 05/06/2019   Recurrent right inguinal hernia s/p lap repair w mesh 05/06/2019 03/01/2019   Type 2 diabetes mellitus (HCC) 03/01/2019   Community acquired pneumonia of right lower lobe of lung 04/21/2014   COPD with  acute exacerbation (HCC) 04/21/2014   PCP:  Pearline Cables, MD Pharmacy:   [facility] [address], [place] - [address] [place] [address] Phone: [phone] Fax: [phone]     Social Determinants of Health (SDOH) Social History: SDOH Screenings   Food Insecurity: No Food Insecurity (04/23/2021)  Housing:  Low Risk  (01/24/2022)  Transportation Needs: No Transportation Needs (04/23/2021)  Utilities: Not At Risk (01/24/2022)  Alcohol Screen: Low Risk  (01/24/2022)  Depression (PHQ2-9): Low Risk  (03/05/2022)  Financial Resource Strain: Low Risk  (01/19/2021)  Physical Activity: Inactive (01/19/2021)  Social Connections: Moderately Isolated (01/19/2021)  Stress: No Stress Concern Present (01/19/2021)  Tobacco Use: Medium Risk (06/18/2022)   SDOH Interventions:     Readmission Risk Interventions    06/20/2022   11:41 AM 09/14/2021    9:56 AM  Readmission Risk Prevention Plan  Post Dischage Appt Complete Complete  Medication Screening Complete Complete  Transportation Screening Complete Complete

## 2022-06-20 NOTE — Progress Notes (Signed)
Physical Therapy Treatment Patient Details Name: Johnathan [name] MRN: [id number] DOB: September 06, 1932 Today's Date: 06/20/2022   History of Present Illness Patient is a 89 year old male who presented on 5/28 with cough and fatigue. Patient was admitted with CAP, acute on chronic hypoxic respiratory failure, and COPD exacerbation. PMH: GERD, HTN, DM II, HLD, essential tremor    PT Comments    General Comments: AxO x 3 very pleasant and motivated.  Transferred from SDU to 5 [address].  Currently on 3 lts O2 sats 94%.  Assisted OOB required increased time.  Pt self able to sit EOB.  Assisted with standing using walker and amb in hallway while monitoring sats.  General Gait Details: tolerated an increased distance however required the use of a walker and 3 lts oxygen to achieve sats >92%.  Dyspnea 2/4.  Coughing ALOT.  Avg HR 79.  Increased c/o fatigue with increased amb distance. Assisted back to bed per pt request to rest.  Max COUGHING after activity.  Alerted RN and pt asking for a suppressant.  Pt plans to return home with Roommate.   Recommendations for follow up therapy are one component of a multi-disciplinary discharge planning process, led by the attending physician.  Recommendations may be updated based on patient status, additional functional criteria and insurance authorization.  Follow Up Recommendations       Assistance Recommended at Discharge Set up Supervision/Assistance  Patient can return home with the following A little help with walking and/or transfers;A little help with bathing/dressing/bathroom;Assistance with cooking/housework;Assist for transportation;Help with stairs or ramp for entrance   Equipment Recommendations  None recommended by PT    Recommendations for Other Services       Precautions / Restrictions Precautions Precautions: Fall Precaution Comments: monitor O2 Restrictions Weight Bearing Restrictions: No     Mobility  Bed Mobility Overal bed mobility: Needs  Assistance Bed Mobility: Supine to Sit, Sit to Supine     Supine to sit: Supervision Sit to supine: Supervision   General bed mobility comments: increased time    Transfers Overall transfer level: Needs assistance Equipment used: Rolling walker (2 wheels) Transfers: Sit to/from Stand Sit to Stand: Min guard, Min assist           General transfer comment: cues for safety, unsteady upon rising to stand  at [employer]    Ambulation/Gait Ambulation/Gait assistance: Min assist Gait Distance (Feet): 115 Feet Assistive device: Rolling walker (2 wheels) Gait Pattern/deviations: Step-through pattern, Trunk flexed Gait velocity: decr     General Gait Details: tolerated an increased distance however required the use of a walker and 3 lts oxygen to achieve sats >92%.  Dyspnea 2/4.  Coughing ALOT.  Avg HR 79.  Increased c/o fatigue with increased amb distance.   Stairs             Wheelchair Mobility    Modified Rankin (Stroke Patients Only)       Balance                                            Cognition Arousal/Alertness: Awake/alert Behavior During Therapy: WFL for tasks assessed/performed Overall Cognitive Status: Within Functional Limits for tasks assessed                                 General Comments:  AxO x 3 very pleasant and motivated        Exercises      General Comments        Pertinent Vitals/Pain Pain Assessment Pain Assessment: No/denies pain    Home Living                          Prior Function            PT Goals (current goals can now be found in the care plan section) Progress towards PT goals: Progressing toward goals    Frequency    Min 1X/week      PT Plan Current plan remains appropriate    Co-evaluation              AM-PAC PT "6 Clicks" Mobility   Outcome Measure  Help needed turning from your back to your side while in a flat bed without using bedrails?:  None Help needed moving from lying on your back to sitting on the side of a flat bed without using bedrails?: None Help needed moving to and from a bed to a chair (including a wheelchair)?: A Little Help needed standing up from a chair using your arms (e.g., wheelchair or bedside chair)?: A Little Help needed to walk in hospital room?: A Little Help needed climbing 3-5 steps with a railing? : A Little 6 Click Score: 20    End of Session Equipment Utilized During Treatment: Gait belt Activity Tolerance: Patient tolerated treatment well Patient left: in chair;with chair alarm set;with nursing/sitter in room;with call bell/phone within reach Nurse Communication: Mobility status PT Visit Diagnosis: Unsteadiness on feet (R26.81);Difficulty in walking, not elsewhere classified (R26.2)     Time: 1610-9604 PT Time Calculation (min) (ACUTE ONLY): 25 min  Charges:  $Gait Training: 8-22 mins $Therapeutic Activity: 8-22 mins                     Felecia Shelling  PTA Acute  Rehabilitation Services Office M-F          [phone]

## 2022-06-20 NOTE — Progress Notes (Signed)
Occupational Therapy Treatment Patient Details Name: Johnathan [name] MRN: [id number] DOB: 1932-02-27 Today's Date: 06/20/2022   History of present illness Patient is a 89 year old male who presented on 5/28 with cough and fatigue. Patient was admitted with CAP, acute on chronic hypoxic respiratory failure, and COPD exacerbation. PMH: GERD, HTN, DM II, HLD, essential tremor   OT comments  The pt was friendly and motivated towards therapy. He performed out of bed, sit to stand, and transferring to the bedside chair with SBA. He reported being modified independent to independent with ADLs at his baseline, and anticipates returning to normal activities. The session emphasized out of bed tolerance and general functional strengthening needed to facilitate optimal ADL performance. He reported good awareness of energy conservation strategies during household and self care tasks. OT anticipates he will only require 1 additional therapy session during this hospital session.   [place]  Recommendations for follow up therapy are one component of a multi-disciplinary discharge planning process, led by the attending physician.  Recommendations may be updated based on patient status, additional functional criteria and insurance authorization.    Assistance Recommended at Discharge None  Patient can return home with the following  Assistance with cooking/housework;Assist for transportation   Equipment Recommendations  None recommended by OT       Precautions / Restrictions Precautions Precautions: Fall Precaution Comments: monitor O2 Restrictions Weight Bearing Restrictions: No Other Position/Activity Restrictions: O2 use at baseline       Mobility Bed Mobility Overal bed mobility: Modified Independent Bed Mobility: Supine to Sit     Supine to sit: Modified independent (Device/Increase time)          Transfers Overall transfer level: Needs assistance Equipment used: None Transfers: Sit to/from  Stand Sit to Stand: Supervision                     ADL either performed or assessed with clinical judgement   ADL   Eating/Feeding: Independent;Sitting Eating/Feeding Details (indicate cue type and reason): at chair level Grooming: Modified independent;Sitting Grooming Details (indicate cue type and reason): simulated         Upper Body Dressing : Set up;Sitting   Lower Body Dressing: Set up;Sit to/from stand   Toilet Transfer: Supervision/safety;Ambulation                             Cognition Arousal/Alertness: Awake/alert Behavior During Therapy: WFL for tasks assessed/performed Overall Cognitive Status: Within Functional Limits for tasks assessed            General Comments: AxO x 3 very pleasant and motivated              General Comments     Pertinent Vitals/ Pain       Pain Assessment Pain Assessment: No/denies pain         Frequency  Min 1X/week        Progress Toward Goals  OT Goals(current goals can now be found in the care plan section)  Progress towards OT goals: Progressing toward goals  Acute Rehab OT Goals OT Goal Formulation: With patient Time For Goal Achievement: 07/03/22 Potential to Achieve Goals: Good  Plan Discharge plan remains appropriate       AM-PAC OT "6 Clicks" Daily Activity     Outcome Measure   Help from another person eating meals?: None Help from another person taking care of personal grooming?: None Help from another person  toileting, which includes using toliet, bedpan, or urinal?: A Little Help from another person bathing (including washing, rinsing, drying)?: A Little Help from another person to put on and taking off regular upper body clothing?: None Help from another person to put on and taking off regular lower body clothing?: None 6 Click Score: 22    End of Session Equipment Utilized During Treatment: Oxygen  OT Visit Diagnosis: Unsteadiness on feet (R26.81)   Activity  Tolerance Patient tolerated treatment well   Patient Left in chair;with call bell/phone within reach   Nurse Communication Mobility status        Time: 1610-9604 OT Time Calculation (min): 21 min  Charges: OT General Charges $OT Visit: 1 Visit OT Treatments $Self Care/Home Management : 8-22 mins     Reuben Likes, OTR/L 06/20/2022, 5:39 PM

## 2022-06-21 DIAGNOSIS — R6520 Severe sepsis without septic shock: Secondary | ICD-10-CM | POA: Diagnosis not present

## 2022-06-21 DIAGNOSIS — A419 Sepsis, unspecified organism: Secondary | ICD-10-CM | POA: Diagnosis not present

## 2022-06-21 LAB — BASIC METABOLIC PANEL
Anion gap: 9 (ref 5–15)
BUN: 21 mg/dL (ref 8–23)
CO2: 27 mmol/L (ref 22–32)
Calcium: 8.8 mg/dL — ABNORMAL LOW (ref 8.9–10.3)
Chloride: 99 mmol/L (ref 98–111)
Creatinine, Ser: 1.01 mg/dL (ref 0.61–1.24)
GFR, Estimated: >60 mL/min (ref >60)
Glucose, Bld: 163 mg/dL — ABNORMAL HIGH (ref 70–99)
Potassium: 3.6 mmol/L (ref 3.5–5.1)
Sodium: 135 mmol/L (ref 135–145)

## 2022-06-21 LAB — CULTURE, BLOOD (ROUTINE X 2)
Culture: NO GROWTH
Special Requests: ADEQUATE

## 2022-06-21 LAB — CBC
HCT: 34.3 % — ABNORMAL LOW (ref 39.0–52.0)
Hemoglobin: 11.4 g/dL — ABNORMAL LOW (ref 13.0–17.0)
MCH: 32.1 pg (ref 26.0–34.0)
MCHC: 33.2 g/dL (ref 30.0–36.0)
MCV: 96.6 fL (ref 80.0–100.0)
Platelets: 145 10*3/uL — ABNORMAL LOW (ref 150–400)
RBC: 3.55 MIL/uL — ABNORMAL LOW (ref 4.22–5.81)
RDW: 13.4 % (ref 11.5–15.5)
WBC: 7.6 10*3/uL (ref 4.0–10.5)
nRBC: 0 % (ref 0.0–0.2)

## 2022-06-21 LAB — GLUCOSE, CAPILLARY
Glucose-Capillary: 139 mg/dL — ABNORMAL HIGH (ref 70–99)
Glucose-Capillary: 136 mg/dL — ABNORMAL HIGH (ref 70–99)
Glucose-Capillary: 259 mg/dL — ABNORMAL HIGH (ref 70–99)

## 2022-06-21 LAB — BRAIN NATRIURETIC PEPTIDE: B Natriuretic Peptide: 302.4 pg/mL — ABNORMAL HIGH (ref 0.0–100.0)

## 2022-06-21 LAB — PHOSPHORUS: Phosphorus: 3.3 mg/dL (ref 2.5–4.6)

## 2022-06-21 LAB — MAGNESIUM: Magnesium: 2 mg/dL (ref 1.7–2.4)

## 2022-06-21 LAB — CULTURE, RESPIRATORY W GRAM STAIN

## 2022-06-21 MED ORDER — POTASSIUM CHLORIDE CRYS ER 20 MEQ PO TBCR
40.0000 meq | EXTENDED_RELEASE_TABLET | ORAL | Status: AC
  Administered 2022-06-21 (×2): 40 meq via ORAL
  Filled 2022-06-21 (×2): qty 2

## 2022-06-21 MED ORDER — HYDROCOD POLI-CHLORPHE POLI ER 10-8 MG/5ML PO SUER
5.0000 mL | Freq: Two times a day (BID) | ORAL | Status: DC | PRN
  Administered 2022-06-21 – 2022-06-24 (×5): 5 mL via ORAL
  Filled 2022-06-21 (×5): qty 5

## 2022-06-21 MED ORDER — FUROSEMIDE 40 MG PO TABS
40.0000 mg | ORAL_TABLET | Freq: Every day | ORAL | Status: DC
  Administered 2022-06-21 – 2022-06-23 (×3): 40 mg via ORAL
  Filled 2022-06-21 (×3): qty 1

## 2022-06-21 NOTE — Progress Notes (Signed)
PROGRESS NOTE    Johnathan [name]  ZOX:[id number] DOB: 1932-12-03 DOA: 06/18/2022 PCP: Pearline Cables, MD  Chief Complaint  Patient presents with   Shortness of Breath    Brief Narrative:   Johnathan [name] is Johnathan [name] 89 y.o. male with medical history significant for COPD, chronic respiratory failure with hypoxia on 2 L O2 via [place], T2DM, HTN, HLD, GERD who presented to the ED for evaluation of fatigue and cough.   Found to have evidence of CAP on CXR, currently admitted and being treated for acute on chronic hypoxic respiratory failure due to pneumonia and COPD exacerbation.   Assessment & Plan:   Principal Problem:   Severe sepsis (HCC) Active Problems:   Community acquired pneumonia of right lower lobe of lung   COPD with acute exacerbation (HCC)   Type 2 diabetes mellitus (HCC)   Hypertension associated with diabetes (HCC)   Acute on chronic respiratory failure with hypoxia (HCC)   Hyperlipidemia associated with type 2 diabetes mellitus (HCC)  Acute on Chronic Hypoxic Respiratory Failure Severe Sepsis due to Community Acquired Pneumonia COPD with acute Exacerbation  Rhinovirus infection Met criteria for severe sepsis on admission Chronically on 2 L for his COPD CXR with right basilar opacity -> needs follow up imaging outpatient to ensure resolution  Negative covid, influenza, RSV Negative MRSA PCR RVP with rhinovirus  Sputum culture with gram negative coccobacilli -> follow final culture Blood cultures pending urine strep negative, urine legionella negative Continue ceftriaxone, azithromycin Steroids, scheduled/prn nebs (takes trelegy at home) Wean O2 as tolerated Strict I/O, daily weights - net positive, not grossly overloaded, will dose lasix with goal net even   NSVT Continue tele Replace mag/k   T2DM Holding metformin Follow A1c 6.6 SSI - BG's worse with steroids, follow. Should improve once steroids d/c'd. Continue statin, aspirin  HTN Hold amlodipine and  lasix (reportedly not taking this)  Essential Tremor Continue primidone  HLD Continue statin, aspirin  GERD Prilosec    DVT prophylaxis: lovenox Code Status: DNR, ok with intubation Family Communication: none Disposition:   Status is: Inpatient Remains inpatient appropriate because: need for continued IV therapies, supplemental oxygen   Consultants:  none  Procedures:  none  Antimicrobials:  Anti-infectives (From admission, onward)    Start     Dose/Rate Route Frequency Ordered Stop   06/20/22 1230  azithromycin (ZITHROMAX) tablet 500 mg        500 mg Oral Daily 06/20/22 1136 06/23/22 0959   06/18/22 1245  cefTRIAXone (ROCEPHIN) 2 g in sodium chloride 0.9 % 100 mL IVPB        2 g 200 mL/hr over 30 Minutes Intravenous Every 24 hours 06/18/22 1242 06/23/22 1244   06/18/22 1245  azithromycin (ZITHROMAX) 500 mg in sodium chloride 0.9 % 250 mL IVPB  Status:  Discontinued        500 mg 250 mL/hr over 60 Minutes Intravenous Every 24 hours 06/18/22 1242 06/20/22 1136       Subjective: C/o coughing fit  Objective: Vitals:   06/20/22 2328 06/21/22 0550 06/21/22 0735 06/21/22 1145  BP: (!) 147/75 (!) 170/89  (!) 159/93  Pulse: 90 85  86  Resp: 20 18  16   Temp: 97.9 F (36.6 C) 97.9 F (36.6 C)  97.7 F (36.5 C)  TempSrc: Oral Oral  Oral  SpO2: 94% 92% 93% 94%  Weight:      Height:        Intake/Output Summary (Last 24 hours) at 06/21/2022  1300 Last data filed at 06/21/2022 1207 Gross per 24 hour  Intake 243 ml  Output 3000 ml  Net -2757 ml   Filed Weights   06/18/22 1210 06/20/22 0554  Weight: 66.2 kg 69.9 kg    Examination:  General: No acute distress. Cardiovascular: RRR Lungs: diminished expiratory wheezes Abdomen: Soft, nontender, nondistended  Neurological: Alert and oriented 3. Moves all extremities 4 with equal strength. Cranial nerves II through XII grossly intact. Extremities: No clubbing or cyanosis. No edema.  Data Reviewed: I have  personally reviewed following labs and imaging studies  CBC: Recent Labs  Lab 06/18/22 1248 06/18/22 1255 06/19/22 0306 06/20/22 0313 06/21/22 0616  WBC 9.7  --  10.9* 7.9 7.6  NEUTROABS 7.8*  --   --  6.4  --   HGB 14.4 13.9 12.1* 11.3* 11.4*  HCT 43.5 41.0 36.3* 35.3* 34.3*  MCV 95.8  --  96.3 98.6 96.6  PLT 151  --  132* 141* 145*    Basic Metabolic Panel: Recent Labs  Lab 06/18/22 1248 06/18/22 1255 06/19/22 0306 06/20/22 0313 06/21/22 0616  NA 135 136 135 134* 135  K 4.4 5.4* 4.4 4.3 3.6  CL 98  --  102 102 99  CO2 25  --  26 25 27   GLUCOSE 141*  --  211* 186* 163*  BUN 15  --  17 21 21   CREATININE 1.02  --  0.88 0.98 1.01  CALCIUM 9.2  --  8.8* 8.6* 8.8*  MG  --   --   --  2.2 2.0  PHOS  --   --   --  2.3* 3.3    GFR: Estimated Creatinine Clearance: 50.9 mL/min (by C-G formula based on SCr of 1.01 mg/dL).  Liver Function Tests: Recent Labs  Lab 06/18/22 1248 06/20/22 0313  AST 24 16  ALT 15 16  ALKPHOS 92 59  BILITOT 0.8 0.2*  PROT 8.5* 7.1  ALBUMIN 4.2 3.2*    CBG: Recent Labs  Lab 06/20/22 1211 06/20/22 1648 06/20/22 2148 06/21/22 0752 06/21/22 1141  GLUCAP 191* 230* 162* 139* 136*     Recent Results (from the past 240 hour(s))  Culture, blood (routine x 2)     Status: None (Preliminary result)   Collection Time: 06/18/22 12:30 PM   Specimen: BLOOD  Result Value Ref Range Status   Specimen Description   Final    BLOOD RIGHT ANTECUBITAL Performed at [facility], [address] [place], [place] [address]    Special Requests   Final    BOTTLES DRAWN AEROBIC AND ANAEROBIC Blood Culture results may not be optimal due to an inadequate volume of blood received in culture bottles Performed at [facility], [address] [place], [place] [address]    Culture   Final    NO GROWTH 3 DAYS Performed at [facility], [address] [place], [place] [address]    Report Status PENDING  Incomplete  Culture,  blood (routine x 2)     Status: None (Preliminary result)   Collection Time: 06/18/22 12:35 PM   Specimen: BLOOD  Result Value Ref Range Status   Specimen Description   Final    BLOOD LEFT ANTECUBITAL Performed at [facility], [address] [place], [place] [address]    Special Requests   Final    BOTTLES DRAWN AEROBIC AND ANAEROBIC Blood Culture adequate volume Performed at [facility], [address] [place], [place] [address]    Culture   Final    NO GROWTH 3 DAYS Performed at [facility], [address] [place], [place] [address]    Report Status PENDING  Incomplete  Resp panel by RT-PCR (RSV, Flu Desean Heemstra&B, Covid) Anterior Nasal Swab     Status: None   Collection Time: 06/18/22 12:51 PM   Specimen: Anterior Nasal Swab  Result Value Ref Range Status   SARS Coronavirus 2 by RT PCR NEGATIVE NEGATIVE Final    Comment: (NOTE) SARS-CoV-2 target nucleic acids are NOT DETECTED.  The SARS-CoV-2 RNA is generally detectable in upper respiratory specimens during the acute phase of infection. The lowest concentration of SARS-CoV-2 viral copies this assay can detect is 138 copies/mL. Leatta Alewine negative result does not preclude SARS-Cov-2 infection and should not be used as the sole basis for treatment or other patient management decisions. Waleska Buttery negative result may occur with  improper specimen collection/handling, submission of specimen other than nasopharyngeal swab, presence of viral mutation(s) within the areas targeted by this assay, and inadequate number of viral copies(<138 copies/mL). Michelle Wnek negative result must be combined with clinical observations, patient history, and epidemiological information. The expected result is Negative.  Fact Sheet for Patients:  BloggerCourse.com  Fact Sheet for Healthcare Providers:  SeriousBroker.it  This test is no t yet approved or cleared by the [place] FDA and  has been  authorized for detection and/or diagnosis of SARS-CoV-2 by FDA under an Emergency Use Authorization (EUA). This EUA will remain  in effect (meaning this test can be used) for the duration of the COVID-19 declaration under Section 564(b)(1) of the Act, 21 U.S.C.section 360bbb-3(b)(1), unless the authorization is terminated  or revoked sooner.       Influenza Sisto Granillo by PCR NEGATIVE NEGATIVE Final   Influenza B by PCR NEGATIVE NEGATIVE Final    Comment: (NOTE) The Xpert Xpress SARS-CoV-2/FLU/RSV plus assay is intended as an aid in the diagnosis of influenza from Nasopharyngeal swab specimens and should not be used as Annalis Kaczmarczyk sole basis for treatment. Nasal washings and aspirates are unacceptable for Xpert Xpress SARS-CoV-2/FLU/RSV testing.  Fact Sheet for Patients: BloggerCourse.com  Fact Sheet for Healthcare Providers: SeriousBroker.it  This test is not yet approved or cleared by the [place] FDA and has been authorized for detection and/or diagnosis of SARS-CoV-2 by FDA under an Emergency Use Authorization (EUA). This EUA will remain in effect (meaning this test can be used) for the duration of the COVID-19 declaration under Section 564(b)(1) of the Act, 21 U.S.C. section 360bbb-3(b)(1), unless the authorization is terminated or revoked.     Resp Syncytial Virus by PCR NEGATIVE NEGATIVE Final    Comment: (NOTE) Fact Sheet for Patients: BloggerCourse.com  Fact Sheet for Healthcare Providers: SeriousBroker.it  This test is not yet approved or cleared by the [place] FDA and has been authorized for detection and/or diagnosis of SARS-CoV-2 by FDA under an Emergency Use Authorization (EUA). This EUA will remain in effect (meaning this test can be used) for the duration of the COVID-19 declaration under Section 564(b)(1) of the Act, 21 U.S.C. section 360bbb-3(b)(1), unless the  authorization is terminated or revoked.  Performed at [facility], [address] [place], [place] [address]   MRSA Next Gen by PCR, Nasal     Status: None   Collection Time: 06/18/22  7:12 PM   Specimen: Nasal Mucosa; Nasal Swab  Result Value Ref Range Status   MRSA by  PCR Next Gen NOT DETECTED NOT DETECTED Final    Comment: (NOTE) The GeneXpert MRSA Assay (FDA approved for NASAL specimens only), is one component of Persephanie Laatsch comprehensive MRSA colonization surveillance program. It is not intended to diagnose MRSA infection nor to guide or monitor treatment for MRSA infections. Test performance is not FDA approved in patients less than 18 years old. Performed at [facility], [address], [place], [place] [address]   Respiratory (~20 pathogens) panel by PCR     Status: Abnormal   Collection Time: 06/19/22  8:34 AM   Specimen: Nasopharyngeal Swab; Respiratory  Result Value Ref Range Status   Adenovirus NOT DETECTED NOT DETECTED Final   Coronavirus 229E NOT DETECTED NOT DETECTED Final    Comment: (NOTE) The Coronavirus on the Respiratory Panel, DOES NOT test for the novel  Coronavirus (2019 nCoV)    Coronavirus HKU1 NOT DETECTED NOT DETECTED Final   Coronavirus NL63 NOT DETECTED NOT DETECTED Final   Coronavirus OC43 NOT DETECTED NOT DETECTED Final   Metapneumovirus NOT DETECTED NOT DETECTED Final   Rhinovirus / Enterovirus DETECTED (Shelba Susi) NOT DETECTED Final   Influenza Kruti Horacek NOT DETECTED NOT DETECTED Final   Influenza B NOT DETECTED NOT DETECTED Final   Parainfluenza Virus 1 NOT DETECTED NOT DETECTED Final   Parainfluenza Virus 2 NOT DETECTED NOT DETECTED Final   Parainfluenza Virus 3 NOT DETECTED NOT DETECTED Final   Parainfluenza Virus 4 NOT DETECTED NOT DETECTED Final   Respiratory Syncytial Virus NOT DETECTED NOT DETECTED Final   Bordetella pertussis NOT DETECTED NOT DETECTED Final   Bordetella Parapertussis NOT DETECTED NOT DETECTED Final    Chlamydophila pneumoniae NOT DETECTED NOT DETECTED Final   Mycoplasma pneumoniae NOT DETECTED NOT DETECTED Final    Comment: Performed at [facility], [address], [place], [place] [address]  Expectorated Sputum Assessment w Gram Stain, Rflx to Resp Cult     Status: None   Collection Time: 06/19/22  8:41 AM   Specimen: Expectorated Sputum  Result Value Ref Range Status   Specimen Description EXPECTORATED SPUTUM  Final   Special Requests NONE  Final   Sputum evaluation   Final    THIS SPECIMEN IS ACCEPTABLE FOR SPUTUM CULTURE Performed at [name], [address], [place], [place] [address]    Report Status 06/20/2022 FINAL  Final  Culture, Respiratory w Gram Stain     Status: None (Preliminary result)   Collection Time: 06/19/22  8:41 AM  Result Value Ref Range Status   Specimen Description   Final    EXPECTORATED SPUTUM Performed at [facility], [address], [place], [place] [address]    Special Requests   Final    NONE Reflexed from [id number] Performed at [facility], [address] [place], [place] [address]    Gram Stain   Final    FEW WBC PRESENT, PREDOMINANTLY MONONUCLEAR RARE GRAM NEGATIVE COCCOBACILLI    Culture   Final    CULTURE REINCUBATED FOR BETTER GROWTH Performed at [facility], [address], [place], [place] [address]    Report Status PENDING  Incomplete         Radiology Studies: No results found.      Scheduled Meds:  amLODipine  5 mg Oral Daily   arformoterol  15 mcg Nebulization BID   aspirin EC  81 mg Oral q morning   azithromycin  500 mg Oral Daily   budesonide (PULMICORT)  nebulizer solution  0.25 mg Nebulization BID   enoxaparin (LOVENOX) injection  40 mg Subcutaneous QHS   furosemide  40 mg Oral Daily   guaiFENesin  600 mg Oral BID   insulin aspart  0-5 Units Subcutaneous QHS   insulin aspart  0-9 Units Subcutaneous TID WC   ipratropium-albuterol  3 mL  Nebulization Q6H WA   pantoprazole  40 mg Oral BID AC   pravastatin  20 mg Oral Daily   predniSONE  40 mg Oral Q breakfast   primidone  50 mg Oral BID   sodium chloride flush  3 mL Intravenous Q12H   Continuous Infusions:  sodium chloride Stopped (06/18/22 1657)   cefTRIAXone (ROCEPHIN)  IV 2 g (06/21/22 1149)     LOS: 3 days    Time spent: over 30 min    Lacretia Nicks, MD [facility]   To contact the attending provider between 7A-7P or the covering provider during after hours 7P-7A, please log into the web site [email or web address] and access using universal [place] password for that web site. If you do not have the password, please call the hospital operator.  06/21/2022, 1:00 PM

## 2022-06-21 NOTE — Progress Notes (Signed)
Physical Therapy Treatment Patient Details Name: Johnathan [name] MRN: [id number] DOB: [birth year] Today's Date: 06/21/2022   History of Present Illness Patient is a 89 year old male who presented on 5/28 with cough and fatigue. Patient was admitted with CAP, acute on chronic hypoxic respiratory failure, and COPD exacerbation. PMH: GERD, HTN, DM II, HLD, essential tremor    PT Comments    Pt pleasant and motivated.  Already OOB in recliner.  On 2 lts at rest 94%.  Assisted with amb in hallway.  General Gait Details: decreased amb distance this session due to increased episodes of COUGHING.  Remained on 2 lts oxygen with avg sats 92%.  MAX faigue due to COUGHING. Pt plans to return home with a Roommate.  "We help eachother"   Recommendations for follow up therapy are one component of a multi-disciplinary discharge planning process, led by the attending physician.  Recommendations may be updated based on patient status, additional functional criteria and insurance authorization.  Follow Up Recommendations       Assistance Recommended at Discharge Set up Supervision/Assistance  Patient can return home with the following A little help with walking and/or transfers;A little help with bathing/dressing/bathroom;Assistance with cooking/housework;Assist for transportation;Help with stairs or ramp for entrance   Equipment Recommendations  None recommended by PT    Recommendations for Other Services       Precautions / Restrictions Precautions Precautions: Fall Precaution Comments: monitor O2 Restrictions Weight Bearing Restrictions: No Other Position/Activity Restrictions: O2 use at baseline 2 lts     Mobility  Bed Mobility               General bed mobility comments: OOB in recliner    Transfers Overall transfer level: Needs assistance Equipment used: Rolling walker (2 wheels) Transfers: Sit to/from Stand Sit to Stand: Supervision           General transfer comment: cues  for hand placeemnt and safety with turns    Ambulation/Gait Ambulation/Gait assistance: Min guard, Min assist Gait Distance (Feet): 55 Feet Assistive device: Rolling walker (2 wheels) Gait Pattern/deviations: Step-through pattern, Trunk flexed Gait velocity: decreased     General Gait Details: decreased amb distance this session due to increased episodes of COUGHING.  Remained on 2 lts oxygen with avg sats 92%.  MAX faigue due to COUGHING.   Stairs             Wheelchair Mobility    Modified Rankin (Stroke Patients Only)       Balance                                            Cognition Arousal/Alertness: Awake/alert Behavior During Therapy: WFL for tasks assessed/performed Overall Cognitive Status: Within Functional Limits for tasks assessed                                 General Comments: AxO x 3 very pleasant and motivated        Exercises      General Comments        Pertinent Vitals/Pain Pain Assessment Pain Assessment: No/denies pain    Home Living                          Prior Function  PT Goals (current goals can now be found in the care plan section) Progress towards PT goals: Progressing toward goals    Frequency    Min 1X/week      PT Plan Current plan remains appropriate    Co-evaluation              AM-PAC PT "6 Clicks" Mobility   Outcome Measure  Help needed turning from your back to your side while in a flat bed without using bedrails?: None Help needed moving from lying on your back to sitting on the side of a flat bed without using bedrails?: None Help needed moving to and from a bed to a chair (including a wheelchair)?: None Help needed standing up from a chair using your arms (e.g., wheelchair or bedside chair)?: None Help needed to walk in hospital room?: None Help needed climbing 3-5 steps with a railing? : None 6 Click Score: 24    End of Session  Equipment Utilized During Treatment: Gait belt Activity Tolerance: Patient tolerated treatment well Patient left: in chair;with chair alarm set;with nursing/sitter in room;with call bell/phone within reach Nurse Communication: Mobility status PT Visit Diagnosis: Unsteadiness on feet (R26.81);Difficulty in walking, not elsewhere classified (R26.2)     Time: 1610-9604 PT Time Calculation (min) (ACUTE ONLY): 28 min  Charges:  $Gait Training: 8-22 mins $Therapeutic Activity: 8-22 mins                     Felecia Shelling  PTA Acute  Rehabilitation Services Office M-F          [phone]

## 2022-06-22 ENCOUNTER — Inpatient Hospital Stay (HOSPITAL_COMMUNITY): Payer: 59

## 2022-06-22 DIAGNOSIS — A419 Sepsis, unspecified organism: Secondary | ICD-10-CM | POA: Diagnosis not present

## 2022-06-22 DIAGNOSIS — R6520 Severe sepsis without septic shock: Secondary | ICD-10-CM | POA: Diagnosis not present

## 2022-06-22 LAB — GLUCOSE, CAPILLARY
Glucose-Capillary: 246 mg/dL — ABNORMAL HIGH (ref 70–99)
Glucose-Capillary: 103 mg/dL — ABNORMAL HIGH (ref 70–99)
Glucose-Capillary: 171 mg/dL — ABNORMAL HIGH (ref 70–99)
Glucose-Capillary: 282 mg/dL — ABNORMAL HIGH (ref 70–99)
Glucose-Capillary: 196 mg/dL — ABNORMAL HIGH (ref 70–99)

## 2022-06-22 LAB — CBC
HCT: 35.8 % — ABNORMAL LOW (ref 39.0–52.0)
Hemoglobin: 11.8 g/dL — ABNORMAL LOW (ref 13.0–17.0)
MCH: 31.7 pg (ref 26.0–34.0)
MCHC: 33 g/dL (ref 30.0–36.0)
MCV: 96.2 fL (ref 80.0–100.0)
Platelets: 157 10*3/uL (ref 150–400)
RBC: 3.72 MIL/uL — ABNORMAL LOW (ref 4.22–5.81)
RDW: 13.5 % (ref 11.5–15.5)
WBC: 5.9 10*3/uL (ref 4.0–10.5)
nRBC: 0 % (ref 0.0–0.2)

## 2022-06-22 LAB — BASIC METABOLIC PANEL
Anion gap: 7 (ref 5–15)
BUN: 21 mg/dL (ref 8–23)
CO2: 30 mmol/L (ref 22–32)
Calcium: 8.6 mg/dL — ABNORMAL LOW (ref 8.9–10.3)
Chloride: 98 mmol/L (ref 98–111)
Creatinine, Ser: 0.99 mg/dL (ref 0.61–1.24)
GFR, Estimated: >60 mL/min (ref >60)
Glucose, Bld: 142 mg/dL — ABNORMAL HIGH (ref 70–99)
Potassium: 3.9 mmol/L (ref 3.5–5.1)
Sodium: 135 mmol/L (ref 135–145)

## 2022-06-22 LAB — PHOSPHORUS: Phosphorus: 3.1 mg/dL (ref 2.5–4.6)

## 2022-06-22 LAB — CULTURE, BLOOD (ROUTINE X 2): Culture: NO GROWTH

## 2022-06-22 LAB — MAGNESIUM: Magnesium: 1.9 mg/dL (ref 1.7–2.4)

## 2022-06-22 MED ORDER — METHYLPREDNISOLONE SODIUM SUCC 40 MG IJ SOLR
40.0000 mg | Freq: Every day | INTRAMUSCULAR | Status: DC
  Administered 2022-06-22 – 2022-06-24 (×3): 40 mg via INTRAVENOUS
  Filled 2022-06-22 (×3): qty 1

## 2022-06-22 NOTE — Progress Notes (Signed)
Mobility Specialist - Progress Note   06/22/22 1404  Therapy Vitals  Pulse Rate 97  Oxygen Therapy  SpO2 (!) 88 %  O2 Device Nasal Cannula  O2 Flow Rate (L/min) 3 L/min  Patient Activity (if Appropriate) Ambulating  Pulse Oximetry Type Continuous  Mobility  Activity Ambulated with assistance in hallway  Level of Assistance Standby assist, set-up cues, supervision of patient - no hands on  Assistive Device Front wheel walker  Distance Ambulated (ft) 150 ft  Range of Motion/Exercises Active  Activity Response Tolerated well  Mobility Referral Yes  $Mobility charge 1 Mobility  Mobility Specialist Start Time (ACUTE ONLY) 1347  Mobility Specialist Stop Time (ACUTE ONLY) 1403  Mobility Specialist Time Calculation (min) (ACUTE ONLY) 16 min   Pre-mobility: 89 HR, 95% SpO2 on 3L During mobility: 97 HR, 88-93% SpO2 on 3L   Pt agreeable to mobilize today. Pt's O2 dropped to high 80s shortly after coughing fit. Pt states "coughing hurts, but does not cause pain". Pt was encouraged to practice pursed lip breathing to help with O2 recovery. Pt returned to room after activity and was left in recliner with chair alarm on and call bell at side.   Arliss Journey Mobility Specialist Acute Rehabilitation Services Phone: [phone] 06/22/22, 2:11 PM

## 2022-06-22 NOTE — Progress Notes (Signed)
PROGRESS NOTE    Johnathan [name]  ZOX:[id number] DOB: 04-14-1932 DOA: 06/18/2022 PCP: Pearline Cables, MD  Chief Complaint  Patient presents with   Shortness of Breath    Brief Narrative:   Johnathan [name] is Johnathan [name] 89 y.o. male with medical history significant for COPD, chronic respiratory failure with hypoxia on 2 L O2 via , T2DM, HTN, HLD, GERD who presented to the ED for evaluation of fatigue and cough.   Found to have evidence of CAP on CXR, currently admitted and being treated for acute on chronic hypoxic respiratory failure due to pneumonia and COPD exacerbation.   Assessment & Plan:   Principal Problem:   Severe sepsis (HCC) Active Problems:   Community acquired pneumonia of right lower lobe of lung   COPD with acute exacerbation (HCC)   Type 2 diabetes mellitus (HCC)   Hypertension associated with diabetes (HCC)   Acute on chronic respiratory failure with hypoxia (HCC)   Hyperlipidemia associated with type 2 diabetes mellitus (HCC)  Acute on Chronic Hypoxic Respiratory Failure Severe Sepsis due to Community Acquired Pneumonia COPD with acute Exacerbation  Rhinovirus infection Met criteria for severe sepsis on admission Chronically on 2 L for his COPD Currently on 3 L, another coughing fit today -> slow improvement, repeat CXR CXR with right basilar opacity -> needs follow up imaging outpatient to ensure resolution  Negative covid, influenza, RSV Negative MRSA PCR RVP with rhinovirus  Sputum culture with gram negative coccobacilli -> follow final culture Blood cultures pending urine strep negative, urine legionella negative Continue ceftriaxone, azithromycin Steroids (transition back to IV with slow improvement), scheduled/prn nebs (takes trelegy at home) Wean O2 as tolerated Strict I/O, daily weights - net positive, not grossly overloaded, will dose lasix with goal net even to net negative  NSVT Continue tele Replace mag/k   T2DM Holding metformin Follow A1c  6.6 SSI - BG's worse with steroids, follow. Should improve once steroids d/c'd. Continue statin, aspirin  HTN Hold amlodipine and lasix (reportedly not taking this)  Essential Tremor Continue primidone  HLD Continue statin, aspirin  GERD Prilosec    DVT prophylaxis: lovenox Code Status: DNR, ok with intubation Family Communication: none Disposition:   Status is: Inpatient Remains inpatient appropriate because: need for continued IV therapies, supplemental oxygen   Consultants:  none  Procedures:  none  Antimicrobials:  Anti-infectives (From admission, onward)    Start     Dose/Rate Route Frequency Ordered Stop   06/20/22 1230  azithromycin (ZITHROMAX) tablet 500 mg        500 mg Oral Daily 06/20/22 1136 06/22/22 1050   06/18/22 1245  cefTRIAXone (ROCEPHIN) 2 g in sodium chloride 0.9 % 100 mL IVPB        2 g 200 mL/hr over 30 Minutes Intravenous Every 24 hours 06/18/22 1242 06/23/22 1244   06/18/22 1245  azithromycin (ZITHROMAX) 500 mg in sodium chloride 0.9 % 250 mL IVPB  Status:  Discontinued        500 mg 250 mL/hr over 60 Minutes Intravenous Every 24 hours 06/18/22 1242 06/20/22 1136       Subjective: No new complaints  Objective: Vitals:   06/21/22 2036 06/22/22 0511 06/22/22 0702 06/22/22 0738  BP: (!) 152/72 (!) 170/87    Pulse: 75 75    Resp: 20 20    Temp: 97.6 F (36.4 C) 98 F (36.7 C)    TempSrc: Oral Oral    SpO2: 96% 92%  93%  Weight:  68.5 kg   Height:        Intake/Output Summary (Last 24 hours) at 06/22/2022 1143 Last data filed at 06/22/2022 0901 Gross per 24 hour  Intake 218 ml  Output 4100 ml  Net -3882 ml   Filed Weights   06/18/22 1210 06/20/22 0554 06/22/22 0702  Weight: 66.2 kg 69.9 kg 68.5 kg    Examination:  General: No acute distress. Cardiovascular: RRR Lungs: coughing fit after he sat up in bed, diffuse rhonchi, scattered wheezes Neurological: Alert and oriented 3. Moves all extremities 4 with equal  strength. Cranial nerves II through XII grossly intact. Extremities: No clubbing or cyanosis. No edema.   Data Reviewed: I have personally reviewed following labs and imaging studies  CBC: Recent Labs  Lab 06/18/22 1248 06/18/22 1255 06/19/22 0306 06/20/22 0313 06/21/22 0616 06/22/22 0651  WBC 9.7  --  10.9* 7.9 7.6 5.9  NEUTROABS 7.8*  --   --  6.4  --   --   HGB 14.4 13.9 12.1* 11.3* 11.4* 11.8*  HCT 43.5 41.0 36.3* 35.3* 34.3* 35.8*  MCV 95.8  --  96.3 98.6 96.6 96.2  PLT 151  --  132* 141* 145* 157    Basic Metabolic Panel: Recent Labs  Lab 06/18/22 1248 06/18/22 1255 06/19/22 0306 06/20/22 0313 06/21/22 0616 06/22/22 0651  NA 135 136 135 134* 135 135  K 4.4 5.4* 4.4 4.3 3.6 3.9  CL 98  --  102 102 99 98  CO2 25  --  26 25 27 30   GLUCOSE 141*  --  211* 186* 163* 142*  BUN 15  --  17 21 21 21   CREATININE 1.02  --  0.88 0.98 1.01 0.99  CALCIUM 9.2  --  8.8* 8.6* 8.8* 8.6*  MG  --   --   --  2.2 2.0 1.9  PHOS  --   --   --  2.3* 3.3 3.1    GFR: Estimated Creatinine Clearance: 50.9 mL/min (by C-G formula based on SCr of 0.99 mg/dL).  Liver Function Tests: Recent Labs  Lab 06/18/22 1248 06/20/22 0313  AST 24 16  ALT 15 16  ALKPHOS 92 59  BILITOT 0.8 0.2*  PROT 8.5* 7.1  ALBUMIN 4.2 3.2*    CBG: Recent Labs  Lab 06/21/22 1141 06/21/22 1626 06/21/22 2110 06/22/22 0758 06/22/22 1133  GLUCAP 136* 259* 246* 103* 171*     Recent Results (from the past 240 hour(s))  Culture, blood (routine x 2)     Status: None (Preliminary result)   Collection Time: 06/18/22 12:30 PM   Specimen: BLOOD  Result Value Ref Range Status   Specimen Description   Final    BLOOD RIGHT ANTECUBITAL Performed at [facility], [address] [employer] [place] [place], [place] [address]    Special Requests   Final    BOTTLES DRAWN AEROBIC AND ANAEROBIC Blood Culture results may not be optimal due to an inadequate volume of blood received in culture bottles Performed at [facility], [address] [place], [place] [address]    Culture   Final    NO GROWTH 4 DAYS Performed at [facility], [address] [place], [place] [address]    Report Status PENDING  Incomplete  Culture, blood (routine x 2)     Status: None (Preliminary result)   Collection Time: 06/18/22 12:35 PM   Specimen: BLOOD  Result Value Ref Range Status   Specimen Description  Final    BLOOD LEFT ANTECUBITAL Performed at [facility] [address] [place], [place] [address]    Special Requests   Final    BOTTLES DRAWN AEROBIC AND ANAEROBIC Blood Culture adequate volume Performed at [facility], [address] [place], [place] [address]    Culture   Final    NO GROWTH 4 DAYS Performed at [facility], [address] [place], [place] [address]    Report Status PENDING  Incomplete  Resp panel by RT-PCR (RSV, Flu Pharell Rolfson&B, Covid) Anterior Nasal Swab     Status: None   Collection Time: 06/18/22 12:51 PM   Specimen: Anterior Nasal Swab  Result Value Ref Range Status   SARS Coronavirus 2 by RT PCR NEGATIVE NEGATIVE Final    Comment: (NOTE) SARS-CoV-2 target nucleic acids are NOT DETECTED.  The SARS-CoV-2 RNA is generally detectable in upper respiratory specimens during the acute phase of infection. The lowest concentration of SARS-CoV-2 viral copies this assay can detect is 138 copies/mL. Kendall Arnell negative result does not preclude SARS-Cov-2 infection and should not be used as the sole basis for treatment or other patient management decisions. Montague Corella negative result may occur with  improper specimen collection/handling, submission of specimen other than nasopharyngeal swab, presence of viral mutation(s) within the areas targeted by this assay, and inadequate number of viral copies(<138 copies/mL). Taetum Flewellen negative result must be combined with clinical observations, patient history, and epidemiological information. The expected result is Negative.  Fact  Sheet for Patients:  BloggerCourse.com  Fact Sheet for Healthcare Providers:  SeriousBroker.it  This test is no t yet approved or cleared by the [place] FDA and  has been authorized for detection and/or diagnosis of SARS-CoV-2 by FDA under an Emergency Use Authorization (EUA). This EUA will remain  in effect (meaning this test can be used) for the duration of the COVID-19 declaration under Section 564(b)(1) of the Act, 21 U.S.C.section 360bbb-3(b)(1), unless the authorization is terminated  or revoked sooner.       Influenza Ciara Kagan by PCR NEGATIVE NEGATIVE Final   Influenza B by PCR NEGATIVE NEGATIVE Final    Comment: (NOTE) The Xpert Xpress SARS-CoV-2/FLU/RSV plus assay is intended as an aid in the diagnosis of influenza from Nasopharyngeal swab specimens and should not be used as Anhar Mcdermott sole basis for treatment. Nasal washings and aspirates are unacceptable for Xpert Xpress SARS-CoV-2/FLU/RSV testing.  Fact Sheet for Patients: BloggerCourse.com  Fact Sheet for Healthcare Providers: SeriousBroker.it  This test is not yet approved or cleared by the [place] FDA and has been authorized for detection and/or diagnosis of SARS-CoV-2 by FDA under an Emergency Use Authorization (EUA). This EUA will remain in effect (meaning this test can be used) for the duration of the COVID-19 declaration under Section 564(b)(1) of the Act, 21 U.S.C. section 360bbb-3(b)(1), unless the authorization is terminated or revoked.     Resp Syncytial Virus by PCR NEGATIVE NEGATIVE Final    Comment: (NOTE) Fact Sheet for Patients: BloggerCourse.com  Fact Sheet for Healthcare Providers: SeriousBroker.it  This test is not yet approved or cleared by the [place] FDA and has been authorized for detection and/or diagnosis of SARS-CoV-2 by FDA under an  Emergency Use Authorization (EUA). This EUA will remain in effect (meaning this test can be used) for the duration of the COVID-19 declaration under Section 564(b)(1) of the Act, 21 U.S.C. section 360bbb-3(b)(1), unless the authorization is terminated or revoked.  Performed at [facility]  [address], [address] [place], [place] [address]   MRSA Next Gen by PCR, Nasal     Status: None   Collection Time: 06/18/22  7:12 PM   Specimen: Nasal Mucosa; Nasal Swab  Result Value Ref Range Status   MRSA by PCR Next Gen NOT DETECTED NOT DETECTED Final    Comment: (NOTE) The GeneXpert MRSA Assay (FDA approved for NASAL specimens only), is one component of Marine Lezotte comprehensive MRSA colonization surveillance program. It is not intended to diagnose MRSA infection nor to guide or monitor treatment for MRSA infections. Test performance is not FDA approved in patients less than 69 years old. Performed at [facility], [address], [place], [place] [address]   Respiratory (~20 pathogens) panel by PCR     Status: Abnormal   Collection Time: 06/19/22  8:34 AM   Specimen: Nasopharyngeal Swab; Respiratory  Result Value Ref Range Status   Adenovirus NOT DETECTED NOT DETECTED Final   Coronavirus 229E NOT DETECTED NOT DETECTED Final    Comment: (NOTE) The Coronavirus on the Respiratory Panel, DOES NOT test for the novel  Coronavirus (2019 nCoV)    Coronavirus HKU1 NOT DETECTED NOT DETECTED Final   Coronavirus NL63 NOT DETECTED NOT DETECTED Final   Coronavirus OC43 NOT DETECTED NOT DETECTED Final   Metapneumovirus NOT DETECTED NOT DETECTED Final   Rhinovirus / Enterovirus DETECTED (Martell Mcfadyen) NOT DETECTED Final   Influenza Vedansh Kerstetter NOT DETECTED NOT DETECTED Final   Influenza B NOT DETECTED NOT DETECTED Final   Parainfluenza Virus 1 NOT DETECTED NOT DETECTED Final   Parainfluenza Virus 2 NOT DETECTED NOT DETECTED Final   Parainfluenza Virus 3 NOT DETECTED NOT DETECTED Final   Parainfluenza  Virus 4 NOT DETECTED NOT DETECTED Final   Respiratory Syncytial Virus NOT DETECTED NOT DETECTED Final   Bordetella pertussis NOT DETECTED NOT DETECTED Final   Bordetella Parapertussis NOT DETECTED NOT DETECTED Final   Chlamydophila pneumoniae NOT DETECTED NOT DETECTED Final   Mycoplasma pneumoniae NOT DETECTED NOT DETECTED Final    Comment: Performed at [facility], [address], [place], [place] [address]  Expectorated Sputum Assessment w Gram Stain, Rflx to Resp Cult     Status: None   Collection Time: 06/19/22  8:41 AM   Specimen: Expectorated Sputum  Result Value Ref Range Status   Specimen Description EXPECTORATED SPUTUM  Final   Special Requests NONE  Final   Sputum evaluation   Final    THIS SPECIMEN IS ACCEPTABLE FOR SPUTUM CULTURE Performed at [facility], [address], [place], [place] [address]    Report Status 06/20/2022 FINAL  Final  Culture, Respiratory w Gram Stain     Status: None (Preliminary result)   Collection Time: 06/19/22  8:41 AM  Result Value Ref Range Status   Specimen Description   Final    EXPECTORATED SPUTUM Performed at [facility], [address] [place], [place] [address]    Special Requests   Final    NONE Reflexed from [id number] Performed at [facility], [address] [place], [place] [address]    Gram Stain   Final    FEW WBC PRESENT, PREDOMINANTLY MONONUCLEAR RARE GRAM NEGATIVE COCCOBACILLI    Culture   Final    CULTURE REINCUBATED FOR BETTER GROWTH Performed at [facility], [address] [place], [place] [address]    Report Status PENDING  Incomplete  Radiology Studies: No results found.      Scheduled Meds:  amLODipine  5 mg Oral Daily   arformoterol  15 mcg Nebulization BID   aspirin EC  81 mg Oral q morning   budesonide (PULMICORT) nebulizer solution  0.25 mg Nebulization BID   enoxaparin (LOVENOX) injection  40 mg Subcutaneous QHS    furosemide  40 mg Oral Daily   guaiFENesin  600 mg Oral BID   insulin aspart  0-5 Units Subcutaneous QHS   insulin aspart  0-9 Units Subcutaneous TID WC   ipratropium-albuterol  3 mL Nebulization Q6H WA   pantoprazole  40 mg Oral BID AC   pravastatin  20 mg Oral Daily   predniSONE  40 mg Oral Q breakfast   primidone  50 mg Oral BID   sodium chloride flush  3 mL Intravenous Q12H   Continuous Infusions:  sodium chloride Stopped (06/18/22 1657)   cefTRIAXone (ROCEPHIN)  IV 2 g (06/21/22 1149)     LOS: 4 days    Time spent: over 30 min    Lacretia Nicks, MD [facility]   To contact the attending provider between 7A-7P or the covering provider during after hours 7P-7A, please log into the web site [email or web address] and access using universal [place] password for that web site. If you do not have the password, please call the hospital operator.  06/22/2022, 11:43 AM

## 2022-06-23 DIAGNOSIS — A419 Sepsis, unspecified organism: Secondary | ICD-10-CM | POA: Diagnosis not present

## 2022-06-23 DIAGNOSIS — R6520 Severe sepsis without septic shock: Secondary | ICD-10-CM | POA: Diagnosis not present

## 2022-06-23 LAB — GLUCOSE, CAPILLARY
Glucose-Capillary: 143 mg/dL — ABNORMAL HIGH (ref 70–99)
Glucose-Capillary: 306 mg/dL — ABNORMAL HIGH (ref 70–99)
Glucose-Capillary: 283 mg/dL — ABNORMAL HIGH (ref 70–99)
Glucose-Capillary: 281 mg/dL — ABNORMAL HIGH (ref 70–99)

## 2022-06-23 LAB — BASIC METABOLIC PANEL
Anion gap: 8 (ref 5–15)
BUN: 24 mg/dL — ABNORMAL HIGH (ref 8–23)
CO2: 29 mmol/L (ref 22–32)
Calcium: 8.8 mg/dL — ABNORMAL LOW (ref 8.9–10.3)
Chloride: 96 mmol/L — ABNORMAL LOW (ref 98–111)
Creatinine, Ser: 0.92 mg/dL (ref 0.61–1.24)
GFR, Estimated: >60 mL/min (ref >60)
Glucose, Bld: 183 mg/dL — ABNORMAL HIGH (ref 70–99)
Potassium: 4.1 mmol/L (ref 3.5–5.1)
Sodium: 133 mmol/L — ABNORMAL LOW (ref 135–145)

## 2022-06-23 LAB — CBC
HCT: 37 % — ABNORMAL LOW (ref 39.0–52.0)
Hemoglobin: 12.3 g/dL — ABNORMAL LOW (ref 13.0–17.0)
MCH: 31.9 pg (ref 26.0–34.0)
MCHC: 33.2 g/dL (ref 30.0–36.0)
MCV: 96.1 fL (ref 80.0–100.0)
Platelets: 176 10*3/uL (ref 150–400)
RBC: 3.85 MIL/uL — ABNORMAL LOW (ref 4.22–5.81)
RDW: 13.2 % (ref 11.5–15.5)
WBC: 7 10*3/uL (ref 4.0–10.5)
nRBC: 0 % (ref 0.0–0.2)

## 2022-06-23 LAB — BRAIN NATRIURETIC PEPTIDE: B Natriuretic Peptide: 82.4 pg/mL (ref 0.0–100.0)

## 2022-06-23 LAB — PHOSPHORUS: Phosphorus: 3.6 mg/dL (ref 2.5–4.6)

## 2022-06-23 LAB — MAGNESIUM: Magnesium: 2 mg/dL (ref 1.7–2.4)

## 2022-06-23 MED ORDER — SODIUM CHLORIDE 0.9 % IV SOLN
2.0000 g | INTRAVENOUS | Status: DC
  Administered 2022-06-23: 2 g via INTRAVENOUS
  Filled 2022-06-23: qty 20

## 2022-06-23 NOTE — Progress Notes (Signed)
PROGRESS NOTE    Johnathan [name]  UJW:[id number] DOB: 09-17-1932 DOA: 06/18/2022 PCP: Pearline Cables, MD  Chief Complaint  Patient presents with   Shortness of Breath    Brief Narrative:   Johnathan [name] is Johnathan [name] 89 y.o. male with medical history significant for COPD, chronic respiratory failure with hypoxia on 2 L O2 via , T2DM, HTN, HLD, GERD who presented to the ED for evaluation of fatigue and cough.   Found to have evidence of CAP on CXR, currently admitted and being treated for acute on chronic hypoxic respiratory failure due to pneumonia and COPD exacerbation.   Assessment & Plan:   Principal Problem:   Severe sepsis (HCC) Active Problems:   Community acquired pneumonia of right lower lobe of lung   COPD with acute exacerbation (HCC)   Type 2 diabetes mellitus (HCC)   Hypertension associated with diabetes (HCC)   Acute on chronic respiratory failure with hypoxia (HCC)   Hyperlipidemia associated with type 2 diabetes mellitus (HCC)  Acute on Chronic Hypoxic Respiratory Failure Severe Sepsis due to Community Acquired Pneumonia COPD with acute Exacerbation  Rhinovirus infection Met criteria for severe sepsis on admission Chronically on 2 L for his COPD Currently on 3 L, coughing spells limiting mobility  CXR with bibasilar opacities (improved on R and worsened on L), needs short term follow up imaging Negative covid, influenza, RSV Negative MRSA PCR RVP with rhinovirus  Sputum culture with gram negative coccobacilli -> follow final culture (pending) Blood cultures pending urine strep negative, urine legionella negative Continue ceftriaxone, azithromycin Steroids (transition back to IV with slow improvement), scheduled/prn nebs (takes trelegy at home) Wean O2 as tolerated Strict I/O, daily weights - net negative 5 L, hold further lasix for now   NSVT Continue tele Replace mag/k   T2DM Holding metformin Follow A1c 6.6 SSI - BG's worse with steroids, follow.  Should improve once steroids d/c'd. Continue statin, aspirin  HTN Hold amlodipine and lasix (reportedly not taking this)  Essential Tremor Continue primidone  HLD Continue statin, aspirin  GERD Prilosec    DVT prophylaxis: lovenox Code Status: DNR, ok with intubation Family Communication: none Disposition:   Status is: Inpatient Remains inpatient appropriate because: need for continued IV therapies, supplemental oxygen   Consultants:  none  Procedures:  none  Antimicrobials:  Anti-infectives (From admission, onward)    Start     Dose/Rate Route Frequency Ordered Stop   06/23/22 1200  cefTRIAXone (ROCEPHIN) 2 g in sodium chloride 0.9 % 100 mL IVPB        2 g 200 mL/hr over 30 Minutes Intravenous Every 24 hours 06/23/22 0723 06/25/22 1159   06/20/22 1230  azithromycin (ZITHROMAX) tablet 500 mg        500 mg Oral Daily 06/20/22 1136 06/22/22 1050   06/18/22 1245  cefTRIAXone (ROCEPHIN) 2 g in sodium chloride 0.9 % 100 mL IVPB        2 g 200 mL/hr over 30 Minutes Intravenous Every 24 hours 06/18/22 1242 06/23/22 1054   06/18/22 1245  azithromycin (ZITHROMAX) 500 mg in sodium chloride 0.9 % 250 mL IVPB  Status:  Discontinued        500 mg 250 mL/hr over 60 Minutes Intravenous Every 24 hours 06/18/22 1242 06/20/22 1136       Subjective: No new complaints  Objective: Vitals:   06/23/22 1000 06/23/22 1003 06/23/22 1004 06/23/22 1202  BP:    (!) 154/72  Pulse: 95 85 (!) 107 86  Resp:    20  Temp:    97.8 F (36.6 C)  TempSrc:    Oral  SpO2: 93% (!) 82% (!) 87% (!) 88%  Weight:      Height:        Intake/Output Summary (Last 24 hours) at 06/23/2022 1236 Last data filed at 06/23/2022 0900 Gross per 24 hour  Intake 358 ml  Output 3400 ml  Net -3042 ml   Filed Weights   06/20/22 0554 06/22/22 0702 06/23/22 0533  Weight: 69.9 kg 68.5 kg 64.7 kg    Examination:  General: No acute distress. Cardiovascular: RRR Lungs: diffuse rhonchi, scattered wheezing -  occasional coughing spell  Abdomen: Soft, nontender, nondistended Neurological: Alert and oriented 3. Moves all extremities 4 with equal strength. Cranial nerves II through XII grossly intact. Extremities: No clubbing or cyanosis. No edema.  Data Reviewed: I have personally reviewed following labs and imaging studies  CBC: Recent Labs  Lab 06/18/22 1248 06/18/22 1255 06/19/22 0306 06/20/22 0313 06/21/22 0616 06/22/22 0651 06/23/22 0511  WBC 9.7  --  10.9* 7.9 7.6 5.9 7.0  NEUTROABS 7.8*  --   --  6.4  --   --   --   HGB 14.4   < > 12.1* 11.3* 11.4* 11.8* 12.3*  HCT 43.5   < > 36.3* 35.3* 34.3* 35.8* 37.0*  MCV 95.8  --  96.3 98.6 96.6 96.2 96.1  PLT 151  --  132* 141* 145* 157 176   < > = values in this interval not displayed.    Basic Metabolic Panel: Recent Labs  Lab 06/19/22 0306 06/20/22 0313 06/21/22 0616 06/22/22 0651 06/23/22 0511  NA 135 134* 135 135 133*  K 4.4 4.3 3.6 3.9 4.1  CL 102 102 99 98 96*  CO2 26 25 27 30 29   GLUCOSE 211* 186* 163* 142* 183*  BUN 17 21 21 21  24*  CREATININE 0.88 0.98 1.01 0.99 0.92  CALCIUM 8.8* 8.6* 8.8* 8.6* 8.8*  MG  --  2.2 2.0 1.9 2.0  PHOS  --  2.3* 3.3 3.1 3.6    GFR: Estimated Creatinine Clearance: 51.8 mL/min (by C-G formula based on SCr of 0.92 mg/dL).  Liver Function Tests: Recent Labs  Lab 06/18/22 1248 06/20/22 0313  AST 24 16  ALT 15 16  ALKPHOS 92 59  BILITOT 0.8 0.2*  PROT 8.5* 7.1  ALBUMIN 4.2 3.2*    CBG: Recent Labs  Lab 06/22/22 1133 06/22/22 1653 06/22/22 2058 06/23/22 0731 06/23/22 1203  GLUCAP 171* 282* 196* 143* 306*     Recent Results (from the past 240 hour(s))  Culture, blood (routine x 2)     Status: None   Collection Time: 06/18/22 12:30 PM   Specimen: BLOOD  Result Value Ref Range Status   Specimen Description   Final    BLOOD RIGHT ANTECUBITAL Performed at [facility], [address] [place], [place] [address]    Special Requests   Final    BOTTLES  DRAWN AEROBIC AND ANAEROBIC Blood Culture results may not be optimal due to an inadequate volume of blood received in culture bottles Performed at [facility], [address] [place], [place] [address]    Culture   Final    NO GROWTH 5 DAYS Performed at [facility], [address] [place], [place] [address]    Report Status 06/23/2022 FINAL  Final  Culture, blood (routine x 2)  Status: None   Collection Time: 06/18/22 12:35 PM   Specimen: BLOOD  Result Value Ref Range Status   Specimen Description   Final    BLOOD LEFT ANTECUBITAL Performed at [facility], [address] [place], [place] [address]    Special Requests   Final    BOTTLES DRAWN AEROBIC AND ANAEROBIC Blood Culture adequate volume Performed at [facility], [address] [place], [place] [address]    Culture   Final    NO GROWTH 5 DAYS Performed at [facility], [address], [place], [place] [address]    Report Status 06/23/2022 FINAL  Final  Resp panel by RT-PCR (RSV, Flu Celie Desrochers&B, Covid) Anterior Nasal Swab     Status: None   Collection Time: 06/18/22 12:51 PM   Specimen: Anterior Nasal Swab  Result Value Ref Range Status   SARS Coronavirus 2 by RT PCR NEGATIVE NEGATIVE Final    Comment: (NOTE) SARS-CoV-2 target nucleic acids are NOT DETECTED.  The SARS-CoV-2 RNA is generally detectable in upper respiratory specimens during the acute phase of infection. The lowest concentration of SARS-CoV-2 viral copies this assay can detect is 138 copies/mL. Can Lucci negative result does not preclude SARS-Cov-2 infection and should not be used as the sole basis for treatment or other patient management decisions. Lissandra Keil negative result may occur with  improper specimen collection/handling, submission of specimen other than nasopharyngeal swab, presence of viral mutation(s) within the areas targeted by this assay, and inadequate number of viral copies(<138 copies/mL). Errol Ala negative result  must be combined with clinical observations, patient history, and epidemiological information. The expected result is Negative.  Fact Sheet for Patients:  BloggerCourse.com  Fact Sheet for Healthcare Providers:  SeriousBroker.it  This test is no t yet approved or cleared by the [place] FDA and  has been authorized for detection and/or diagnosis of SARS-CoV-2 by FDA under an Emergency Use Authorization (EUA). This EUA will remain  in effect (meaning this test can be used) for the duration of the COVID-19 declaration under Section 564(b)(1) of the Act, 21 U.S.C.section 360bbb-3(b)(1), unless the authorization is terminated  or revoked sooner.       Influenza Neria Procter by PCR NEGATIVE NEGATIVE Final   Influenza B by PCR NEGATIVE NEGATIVE Final    Comment: (NOTE) The Xpert Xpress SARS-CoV-2/FLU/RSV plus assay is intended as an aid in the diagnosis of influenza from Nasopharyngeal swab specimens and should not be used as Seve Monette sole basis for treatment. Nasal washings and aspirates are unacceptable for Xpert Xpress SARS-CoV-2/FLU/RSV testing.  Fact Sheet for Patients: BloggerCourse.com  Fact Sheet for Healthcare Providers: SeriousBroker.it  This test is not yet approved or cleared by the [place] FDA and has been authorized for detection and/or diagnosis of SARS-CoV-2 by FDA under an Emergency Use Authorization (EUA). This EUA will remain in effect (meaning this test can be used) for the duration of the COVID-19 declaration under Section 564(b)(1) of the Act, 21 U.S.C. section 360bbb-3(b)(1), unless the authorization is terminated or revoked.     Resp Syncytial Virus by PCR NEGATIVE NEGATIVE Final    Comment: (NOTE) Fact Sheet for Patients: BloggerCourse.com  Fact Sheet for Healthcare Providers: SeriousBroker.it  This test is  not yet approved or cleared by the [place] FDA and has been authorized for detection and/or diagnosis of SARS-CoV-2 by FDA under an Emergency Use Authorization (EUA). This EUA will remain in effect (meaning this test can be used) for the duration  of the COVID-19 declaration under Section 564(b)(1) of the Act, 21 U.S.C. section 360bbb-3(b)(1), unless the authorization is terminated or revoked.  Performed at [facility] [address] [place], [place] [address]   MRSA Next Gen by PCR, Nasal     Status: None   Collection Time: 06/18/22  7:12 PM   Specimen: Nasal Mucosa; Nasal Swab  Result Value Ref Range Status   MRSA by PCR Next Gen NOT DETECTED NOT DETECTED Final    Comment: (NOTE) The GeneXpert MRSA Assay (FDA approved for NASAL specimens only), is one component of Thoma Paulsen comprehensive MRSA colonization surveillance program. It is not intended to diagnose MRSA infection nor to guide or monitor treatment for MRSA infections. Test performance is not FDA approved in patients less than 28 years old. Performed at [facility], [address], [place], [place] [address]   Respiratory (~20 pathogens) panel by PCR     Status: Abnormal   Collection Time: 06/19/22  8:34 AM   Specimen: Nasopharyngeal Swab; Respiratory  Result Value Ref Range Status   Adenovirus NOT DETECTED NOT DETECTED Final   Coronavirus 229E NOT DETECTED NOT DETECTED Final    Comment: (NOTE) The Coronavirus on the Respiratory Panel, DOES NOT test for the novel  Coronavirus (2019 nCoV)    Coronavirus HKU1 NOT DETECTED NOT DETECTED Final   Coronavirus NL63 NOT DETECTED NOT DETECTED Final   Coronavirus OC43 NOT DETECTED NOT DETECTED Final   Metapneumovirus NOT DETECTED NOT DETECTED Final   Rhinovirus / Enterovirus DETECTED (Murry Khiev) NOT DETECTED Final   Influenza Couper Juncaj NOT DETECTED NOT DETECTED Final   Influenza B NOT DETECTED NOT DETECTED Final   Parainfluenza Virus 1 NOT DETECTED NOT DETECTED  Final   Parainfluenza Virus 2 NOT DETECTED NOT DETECTED Final   Parainfluenza Virus 3 NOT DETECTED NOT DETECTED Final   Parainfluenza Virus 4 NOT DETECTED NOT DETECTED Final   Respiratory Syncytial Virus NOT DETECTED NOT DETECTED Final   Bordetella pertussis NOT DETECTED NOT DETECTED Final   Bordetella Parapertussis NOT DETECTED NOT DETECTED Final   Chlamydophila pneumoniae NOT DETECTED NOT DETECTED Final   Mycoplasma pneumoniae NOT DETECTED NOT DETECTED Final    Comment: Performed at [facility], [address], [place], [place] [address]  Expectorated Sputum Assessment w Gram Stain, Rflx to Resp Cult     Status: None   Collection Time: 06/19/22  8:41 AM   Specimen: Expectorated Sputum  Result Value Ref Range Status   Specimen Description EXPECTORATED SPUTUM  Final   Special Requests NONE  Final   Sputum evaluation   Final    THIS SPECIMEN IS ACCEPTABLE FOR SPUTUM CULTURE Performed at [facility], [address] [place], [place] [address]    Report Status 06/20/2022 FINAL  Final  Culture, Respiratory w Gram Stain     Status: None (Preliminary result)   Collection Time: 06/19/22  8:41 AM  Result Value Ref Range Status   Specimen Description   Final    EXPECTORATED SPUTUM Performed at [facility], [address], [place], [place] [address]    Special Requests   Final    NONE Reflexed from [id number] Performed at [facility], [address], [place], [place] [address]    Gram Stain   Final    FEW WBC PRESENT, PREDOMINANTLY MONONUCLEAR RARE GRAM NEGATIVE COCCOBACILLI    Culture   Final    CULTURE REINCUBATED FOR BETTER GROWTH Performed at [facility], [address], [place], [place] [address]    Report Status PENDING  Incomplete         Radiology Studies: DG CHEST PORT 1 VIEW  Result Date: 06/22/2022 CLINICAL DATA:  Cough EXAM: PORTABLE CHEST 1 VIEW COMPARISON:  Jun 18, 2022 FINDINGS: No  pneumothorax. The cardiomediastinal silhouette is stable. Bibasilar opacities are identified, improved on the right and slightly worsened on the left in the interval. No other interval changes. IMPRESSION: Bibasilar opacities, improved on the right and slightly worsened on the left in the interval. Findings could represent atelectasis or developing infiltrates. Recommend short-term follow-up imaging to ensure resolution. Electronically Signed   By: Gerome Sam III M.D.   On: 06/22/2022 15:29        Scheduled Meds:  amLODipine  5 mg Oral Daily   arformoterol  15 mcg Nebulization BID   aspirin EC  81 mg Oral q morning   budesonide (PULMICORT) nebulizer solution  0.25 mg Nebulization BID   enoxaparin (LOVENOX) injection  40 mg Subcutaneous QHS   furosemide  40 mg Oral Daily   guaiFENesin  600 mg Oral BID   insulin aspart  0-5 Units Subcutaneous QHS   insulin aspart  0-9 Units Subcutaneous TID WC   ipratropium-albuterol  3 mL Nebulization Q6H WA   methylPREDNISolone (SOLU-MEDROL) injection  40 mg Intravenous Daily   pantoprazole  40 mg Oral BID AC   pravastatin  20 mg Oral Daily   primidone  50 mg Oral BID   sodium chloride flush  3 mL Intravenous Q12H   Continuous Infusions:  sodium chloride Stopped (06/18/22 1657)   cefTRIAXone (ROCEPHIN)  IV       LOS: 5 days    Time spent: over 30 min    Lacretia Nicks, MD [facility]   To contact the attending provider between 7A-7P or the covering provider during after hours 7P-7A, please log into the web site [email or web address] and access using universal [place] password for that web site. If you do not have the password, please call the hospital operator.  06/23/2022, 12:36 PM

## 2022-06-24 DIAGNOSIS — R6520 Severe sepsis without septic shock: Secondary | ICD-10-CM | POA: Diagnosis not present

## 2022-06-24 DIAGNOSIS — A419 Sepsis, unspecified organism: Secondary | ICD-10-CM | POA: Diagnosis not present

## 2022-06-24 LAB — MAGNESIUM: Magnesium: 2 mg/dL (ref 1.7–2.4)

## 2022-06-24 LAB — CBC WITH DIFFERENTIAL/PLATELET
Abs Immature Granulocytes: 0.13 10*3/uL — ABNORMAL HIGH (ref 0.00–0.07)
Basophils Absolute: 0 10*3/uL (ref 0.0–0.1)
Basophils Relative: 0 %
Eosinophils Absolute: 0.1 10*3/uL (ref 0.0–0.5)
Eosinophils Relative: 1 %
HCT: 39.3 % (ref 39.0–52.0)
Hemoglobin: 12.8 g/dL — ABNORMAL LOW (ref 13.0–17.0)
Immature Granulocytes: 2 %
Lymphocytes Relative: 18 %
Lymphs Abs: 1.4 10*3/uL (ref 0.7–4.0)
MCH: 31.2 pg (ref 26.0–34.0)
MCHC: 32.6 g/dL (ref 30.0–36.0)
MCV: 95.9 fL (ref 80.0–100.0)
Monocytes Absolute: 0.8 10*3/uL (ref 0.1–1.0)
Monocytes Relative: 10 %
Neutro Abs: 5.2 10*3/uL (ref 1.7–7.7)
Neutrophils Relative %: 69 %
Platelets: 183 10*3/uL (ref 150–400)
RBC: 4.1 MIL/uL — ABNORMAL LOW (ref 4.22–5.81)
RDW: 13.2 % (ref 11.5–15.5)
WBC: 7.5 10*3/uL (ref 4.0–10.5)
nRBC: 0 % (ref 0.0–0.2)

## 2022-06-24 LAB — BRAIN NATRIURETIC PEPTIDE: B Natriuretic Peptide: 59.9 pg/mL (ref 0.0–100.0)

## 2022-06-24 LAB — COMPREHENSIVE METABOLIC PANEL
ALT: 33 U/L (ref 0–44)
AST: 18 U/L (ref 15–41)
Albumin: 3.1 g/dL — ABNORMAL LOW (ref 3.5–5.0)
Alkaline Phosphatase: 55 U/L (ref 38–126)
Anion gap: 7 (ref 5–15)
BUN: 25 mg/dL — ABNORMAL HIGH (ref 8–23)
CO2: 33 mmol/L — ABNORMAL HIGH (ref 22–32)
Calcium: 9.1 mg/dL (ref 8.9–10.3)
Chloride: 96 mmol/L — ABNORMAL LOW (ref 98–111)
Creatinine, Ser: 1.04 mg/dL (ref 0.61–1.24)
GFR, Estimated: >60 mL/min (ref >60)
Glucose, Bld: 143 mg/dL — ABNORMAL HIGH (ref 70–99)
Potassium: 3.9 mmol/L (ref 3.5–5.1)
Sodium: 136 mmol/L (ref 135–145)
Total Bilirubin: 0.5 mg/dL (ref 0.3–1.2)
Total Protein: 7.4 g/dL (ref 6.5–8.1)

## 2022-06-24 LAB — CULTURE, RESPIRATORY W GRAM STAIN

## 2022-06-24 LAB — GLUCOSE, CAPILLARY
Glucose-Capillary: 168 mg/dL — ABNORMAL HIGH (ref 70–99)
Glucose-Capillary: 223 mg/dL — ABNORMAL HIGH (ref 70–99)

## 2022-06-24 LAB — PHOSPHORUS: Phosphorus: 4.2 mg/dL (ref 2.5–4.6)

## 2022-06-24 MED ORDER — IPRATROPIUM-ALBUTEROL 0.5-2.5 (3) MG/3ML IN SOLN: 3.0000 mL | Freq: Two times a day (BID) | RESPIRATORY_TRACT | Status: DC

## 2022-06-24 MED ORDER — PREDNISONE 10 MG PO TABS: ORAL_TABLET | ORAL | 0 refills | Status: AC

## 2022-06-24 NOTE — TOC Transition Note (Signed)
Transition of Care [facility]) - CM/SW Discharge Note   Patient Details  Name: Johnathan [name] MRN: [id number] Date of Birth: 16-Feb-1932  Transition of Care [facility]) CM/SW Contact:  Otelia Santee, LCSW Phone Number: 06/24/2022, 10:56 AM   Clinical Narrative:    Pt to return home with plan to continue OPPT. Pt receives 2L O2 through [employer]. Family to provide transportation home for pt.    Final next level of care: OP Rehab Barriers to Discharge: No Barriers Identified   Patient Goals and CMS Choice CMS Medicare.gov Compare Post Acute Care list provided to:: Patient Choice offered to / list presented to : Patient  Discharge Placement                         Discharge Plan and Services Additional resources added to the After Visit Summary for   In-house Referral: Clinical Social Work Discharge Planning Services: NA Post Acute Care Choice: Resumption of Svcs/PTA Provider          DME Arranged: N/A DME Agency: NA                  Social Determinants of Health (SDOH) Interventions SDOH Screenings   Food Insecurity: No Food Insecurity (06/24/2022)  Housing: Low Risk  (06/24/2022)  Transportation Needs: No Transportation Needs (06/24/2022)  Utilities: Not At Risk (06/24/2022)  Alcohol Screen: Low Risk  (01/24/2022)  Depression (PHQ2-9): Low Risk  (03/05/2022)  Financial Resource Strain: Low Risk  (01/19/2021)  Physical Activity: Inactive (01/19/2021)  Social Connections: Moderately Isolated (01/19/2021)  Stress: No Stress Concern Present (01/19/2021)  Tobacco Use: Medium Risk (06/18/2022)     Readmission Risk Interventions    06/24/2022   10:55 AM 06/20/2022   11:41 AM 09/14/2021    9:56 AM  Readmission Risk Prevention Plan  Post Dischage Appt  Complete Complete  Medication Screening  Complete Complete  Transportation Screening Complete Complete Complete  PCP or Specialist Appt within 5-7 Days Complete    Home Care Screening Complete    Medication Review (RN CM) Complete

## 2022-06-24 NOTE — Discharge Summary (Signed)
Physician Discharge Summary  Johnathan [name] ZOX:[id number] DOB: 03-24-1932 DOA: 06/18/2022  PCP: Pearline Cables, MD  Admit date: 06/18/2022 Discharge date: 06/24/2022  Time spent: 40 minutes  Recommendations for Outpatient Follow-up:  Follow outpatient CBC/CMP  Follow final sputum culture outpatient  - growing staph aureus, awaiting sensitivities, if MSSA, should have been covered by ceftriaxone, if MRSA could consider abx based on how he's doing clinically (he's currently improved, will defer to outpatient setting) Needs repeat chest x ray outpatient  Follow long term o2 needs outpatient   Discharge Diagnoses:  Principal Problem:   Severe sepsis (HCC) Active Problems:   Community acquired pneumonia of right lower lobe of lung   COPD with acute exacerbation (HCC)   Type 2 diabetes mellitus (HCC)   Hypertension associated with diabetes (HCC)   Acute on chronic respiratory failure with hypoxia (HCC)   Hyperlipidemia associated with type 2 diabetes mellitus (HCC)   Discharge Condition: stable  Diet recommendation: heart healthy, diabetic  Filed Weights   06/20/22 0554 06/22/22 0702 06/23/22 0533  Weight: 69.9 kg 68.5 kg 64.7 kg    History of present illness:   Johnathan [name] is Johnathan [name] 89 y.o. male with medical history significant for COPD, chronic respiratory failure with hypoxia on 2 L O2 via [place], T2DM, HTN, HLD, GERD who presented to the ED for evaluation of fatigue and cough.    Found to have evidence of CAP on CXR, currently admitted and being treated for acute on chronic hypoxic respiratory failure due to pneumonia and COPD exacerbation.    He's improved with abx, steroids, scheduled/prn nebs.  See below for additional details  Hospital Course:  Assessment and Plan:  Acute on Chronic Hypoxic Respiratory Failure Severe Sepsis due to Community Acquired Pneumonia COPD with acute Exacerbation  Rhinovirus infection Met criteria for severe sepsis on admission Chronically on 2  L for his COPD Currently on 2 L, home o2 CXR with bibasilar opacities (improved on R and worsened on L), needs short term follow up imaging Negative covid, influenza, RSV Negative MRSA PCR RVP with rhinovirus  Sputum culture with gram negative coccobacilli -> rare candida albicans, rare staph aureus, not clear significance of staph, will discuss with ID -> given improvement, can follow outpatient, if MSSA would've been covered by staph) Blood cultures NGTD  urine strep negative, urine legionella negative Completed ceftriazone/azithro  scheduled/prn nebs (takes trelegy at home), steroid taper Wean O2 as tolerated Strict I/O, daily weights - net negative 6.3 L, hold further scheduled lasix for now    NSVT Continue tele Replace mag/k    T2DM Holding metformin Follow A1c 6.6 SSI - BG's worse with steroids, follow. Should improve once steroids d/c'd. Continue statin, aspirin   HTN Resume amlodipine and lasix (reportedly not taking this, it's prn at home )   Essential Tremor Continue primidone  HLD Continue statin, aspirin   GERD Prilosec       Procedures: none   Consultations: none  Discharge Exam: Vitals:   06/24/22 0551 06/24/22 0903  BP: (!) 172/96   Pulse: 76   Resp: 17   Temp: (!) 97.4 F (36.3 C)   SpO2: 98% 96%   No new complaints Says he's going home today  General: No acute distress. Seen walking with therapy, looked well.  Cardiovascular: Heart sounds show Johnathan [name] regular rate, and rhythm. No gallops or rubs.  Lungs: coarse, tight breath sounds persistent, but overall improved, unlabored Abdomen: Soft, nontender, nondistended Neurological: Alert and oriented 3. Moves  all extremities 4 with equal strength. Cranial nerves II through XII grossly intact. Extremities: No clubbing or cyanosis. No edema.  Discharge Instructions   Discharge Instructions     Call MD for:  difficulty breathing, headache or visual disturbances   Complete by: As directed     Call MD for:  extreme fatigue   Complete by: As directed    Call MD for:  hives   Complete by: As directed    Call MD for:  persistant dizziness or light-headedness   Complete by: As directed    Call MD for:  persistant nausea and vomiting   Complete by: As directed    Call MD for:  redness, tenderness, or signs of infection (pain, swelling, redness, odor or green/yellow discharge around incision site)   Complete by: As directed    Call MD for:  severe uncontrolled pain   Complete by: As directed    Call MD for:  temperature >100.4   Complete by: As directed    Diet - low sodium heart healthy   Complete by: As directed    Discharge instructions   Complete by: As directed    You were seen for Johnathan [name] COPD exacerbation, pneumonia, and rhinovirus infection.  You've improved with steroids and antibiotics.  Continue your trelegy at home as prescribed.  Schedule albuterol 3-4 times Johnathan [name] day until you feel back to yourself from Johnathan [name] breathing standpoint (then use as needed).  We'll prescribe Johnathan [name] steroid taper for you.   You should have Johnathan [name] repeat chest x ray in 3-4 weeks with your PCP outpatient.  Follow up with your pulmonologist outpatient.   Continue your 2 L oxygen around the clock.   Return for new, recurrent, or worsening symptoms.  Please ask your PCP to request records from this hospitalization so they know what was done and what the next steps will be.   Increase activity slowly   Complete by: As directed       Allergies as of 06/24/2022   No Known Allergies      Medication List     STOP taking these medications    prednisoLONE acetate 1 % ophthalmic suspension Commonly known as: PRED FORTE       TAKE these medications    albuterol 108 (90 Base) MCG/ACT inhaler Commonly known as: VENTOLIN HFA Inhale 2 puffs into the lungs every 6 (six) hours as needed for wheezing or shortness of breath.   amLODipine 5 MG tablet Commonly known as: NORVASC Take 1 tablet (5 mg total) by  mouth daily.   aspirin EC 81 MG tablet Take 81 mg by mouth every morning.   benzonatate 200 MG capsule Commonly known as: TESSALON Take 1 capsule (200 mg total) by mouth 3 (three) times daily as needed for cough.   Flaxseed Oil 1200 MG Caps Take 1,200 mg by mouth at bedtime.   furosemide 20 MG tablet Commonly known as: LASIX Take 1 tablet (20 mg total) by mouth daily as needed for edema.   Ginkgo Biloba Extr Take 1 capsule by mouth 2 (two) times daily.   guaiFENesin 600 MG 12 hr tablet Commonly known as: Mucinex Take 2 tablets (1,200 mg total) by mouth 2 (two) times daily. What changed:  how much to take when to take this   HIMALAYAN GOJI PO Take 6 Doses by mouth daily. Goji berries   ketoconazole 2 % shampoo Commonly known as: NIZORAL Apply 1 Application topically 2 (two) times Liliana Brentlinger week.   ketoconazole 2 %  cream Commonly known as: NIZORAL Apply 1 Application topically daily. Use as needed for fungal infection   loratadine 10 MG tablet Commonly known as: CLARITIN TAKE 1 TABLET EVERY DAY What changed: when to take this   meloxicam 15 MG tablet Commonly known as: MOBIC TAKE 1 TABLET BY MOUTH DAILY AS  NEEDED FOR JOINT PAIN What changed:  how much to take how to take this when to take this additional instructions   metFORMIN 500 MG tablet Commonly known as: GLUCOPHAGE Take 0.5 tablets (250 mg total) by mouth daily.   multivitamin with minerals Tabs tablet Take 1 tablet by mouth daily with breakfast.   omeprazole 20 MG capsule Commonly known as: PRILOSEC TAKE 1 CAPSULE BY MOUTH TWICE  DAILY What changed: when to take this   OXYGEN Inhale 2 L/min into the lungs continuous.   pravastatin 40 MG tablet Commonly known as: PRAVACHOL Take 0.5 tablets (20 mg total) by mouth daily.   predniSONE 10 MG tablet Commonly known as: DELTASONE Take 4 tablets (40 mg total) by mouth daily for 2 days, THEN 3 tablets (30 mg total) daily for 2 days, THEN 2 tablets (20 mg  total) daily for 2 days, THEN 1 tablet (10 mg total) daily for 2 days. Start taking on: June 24, 2022   primidone 50 MG tablet Commonly known as: MYSOLINE TAKE 1 TABLET BY MOUTH TWICE  DAILY   SUPER B COMPLEX PO Take 1 tablet by mouth at bedtime.   Systane Complete PF 0.6 % Soln Generic drug: Propylene Glycol (PF) Place 1 drop into the right eye in the morning, at noon, and at bedtime.   tadalafil 20 MG tablet Commonly known as: CIALIS TAKE 1/2 TO 1 TAB po 2 HRS PRIOR TO SEXUAL ACTIVITY What changed:  how much to take how to take this when to take this additional instructions   terazosin 1 MG capsule Commonly known as: HYTRIN Take 1 capsule (1 mg total) by mouth 2 (two) times daily.   traMADol 50 MG tablet Commonly known as: ULTRAM TAKE 1 TABLET BY MOUTH EVERY 12  HOURS AS NEEDED FOR MODERATE  PAIN What changed: See the new instructions.   Trelegy Ellipta 100-62.5-25 MCG/ACT Aepb Generic drug: Fluticasone-Umeclidin-Vilant Inhale 1 puff into the lungs at bedtime.   triamcinolone cream 0.1 % Commonly known as: KENALOG Apply 1 Application topically 2 (two) times daily as needed (rash/itching).   VITAMIN E PO Take 1 capsule by mouth daily with breakfast.       No Known Allergies    The results of significant diagnostics from this hospitalization (including imaging, microbiology, ancillary and laboratory) are listed below for reference.    Significant Diagnostic Studies: DG CHEST PORT 1 VIEW  Result Date: 06/22/2022 CLINICAL DATA:  Cough EXAM: PORTABLE CHEST 1 VIEW COMPARISON:  Jun 18, 2022 FINDINGS: No pneumothorax. The cardiomediastinal silhouette is stable. Bibasilar opacities are identified, improved on the right and slightly worsened on the left in the interval. No other interval changes. IMPRESSION: Bibasilar opacities, improved on the right and slightly worsened on the left in the interval. Findings could represent atelectasis or developing infiltrates.  Recommend short-term follow-up imaging to ensure resolution. Electronically Signed   By: Gerome Sam III M.D.   On: 06/22/2022 15:29   DG Chest Port 1 View  Result Date: 06/18/2022 CLINICAL DATA:  Shortness of breath, cough. EXAM: PORTABLE CHEST 1 VIEW COMPARISON:  March 05, 2022. FINDINGS: The heart size and mediastinal contours are within normal limits. Left  lung is clear. Interval development of right basilar opacity concerning for pneumonia. The visualized skeletal structures are unremarkable. IMPRESSION: Right basilar opacity is noted concerning for pneumonia. Followup PA and lateral chest X-ray is recommended in 3-4 weeks following trial of antibiotic therapy to ensure resolution and exclude underlying malignancy. Electronically Signed   By: Lupita Raider M.D.   On: 06/18/2022 13:27    Microbiology: Recent Results (from the past 240 hour(s))  Culture, blood (routine x 2)     Status: None   Collection Time: 06/18/22 12:30 PM   Specimen: BLOOD  Result Value Ref Range Status   Specimen Description   Final    BLOOD RIGHT ANTECUBITAL Performed at [facility], [address] [place], [place] [address]    Special Requests   Final    BOTTLES DRAWN AEROBIC AND ANAEROBIC Blood Culture results may not be optimal due to an inadequate volume of blood received in culture bottles Performed at [facility], [address] [place], [place] [address]    Culture   Final    NO GROWTH 5 DAYS Performed at [facility], [address], [place], [place] [address]    Report Status 06/23/2022 FINAL  Final  Culture, blood (routine x 2)     Status: None   Collection Time: 06/18/22 12:35 PM   Specimen: BLOOD  Result Value Ref Range Status   Specimen Description   Final    BLOOD LEFT ANTECUBITAL Performed at [facility], [address] [place], [place] [address]    Special Requests   Final    BOTTLES DRAWN AEROBIC AND ANAEROBIC Blood Culture adequate  volume Performed at [facility], [address] [place], [place] [address]    Culture   Final    NO GROWTH 5 DAYS Performed at [facility], [address], [place], [place] [address]    Report Status 06/23/2022 FINAL  Final  Resp panel by RT-PCR (RSV, Flu Nicie Milan&B, Covid) Anterior Nasal Swab     Status: None   Collection Time: 06/18/22 12:51 PM   Specimen: Anterior Nasal Swab  Result Value Ref Range Status   SARS Coronavirus 2 by RT PCR NEGATIVE NEGATIVE Final    Comment: (NOTE) SARS-CoV-2 target nucleic acids are NOT DETECTED.  The SARS-CoV-2 RNA is generally detectable in upper respiratory specimens during the acute phase of infection. The lowest concentration of SARS-CoV-2 viral copies this assay can detect is 138 copies/mL. Saw Mendenhall negative result does not preclude SARS-Cov-2 infection and should not be used as the sole basis for treatment or other patient management decisions. Berklie Dethlefs negative result may occur with  improper specimen collection/handling, submission of specimen other than nasopharyngeal swab, presence of viral mutation(s) within the areas targeted by this assay, and inadequate number of viral copies(<138 copies/mL). Alynna Hargrove negative result must be combined with clinical observations, patient history, and epidemiological information. The expected result is Negative.  Fact Sheet for Patients:  BloggerCourse.com  Fact Sheet for Healthcare Providers:  SeriousBroker.it  This test is no t yet approved or cleared by the [place] FDA and  has been authorized for detection and/or diagnosis of SARS-CoV-2 by FDA under an Emergency Use Authorization (EUA). This EUA will remain  in effect (meaning this test can be used) for the duration of the COVID-19 declaration under Section 564(b)(1) of the Act, 21 U.S.C.section 360bbb-3(b)(1), unless the authorization is terminated  or revoked sooner.  Influenza Shirely Toren by PCR  NEGATIVE NEGATIVE Final   Influenza B by PCR NEGATIVE NEGATIVE Final    Comment: (NOTE) The Xpert Xpress SARS-CoV-2/FLU/RSV plus assay is intended as an aid in the diagnosis of influenza from Nasopharyngeal swab specimens and should not be used as Kaliq Lege sole basis for treatment. Nasal washings and aspirates are unacceptable for Xpert Xpress SARS-CoV-2/FLU/RSV testing.  Fact Sheet for Patients: BloggerCourse.com  Fact Sheet for Healthcare Providers: SeriousBroker.it  This test is not yet approved or cleared by the [place] FDA and has been authorized for detection and/or diagnosis of SARS-CoV-2 by FDA under an Emergency Use Authorization (EUA). This EUA will remain in effect (meaning this test can be used) for the duration of the COVID-19 declaration under Section 564(b)(1) of the Act, 21 U.S.C. section 360bbb-3(b)(1), unless the authorization is terminated or revoked.     Resp Syncytial Virus by PCR NEGATIVE NEGATIVE Final    Comment: (NOTE) Fact Sheet for Patients: BloggerCourse.com  Fact Sheet for Healthcare Providers: SeriousBroker.it  This test is not yet approved or cleared by the [place] FDA and has been authorized for detection and/or diagnosis of SARS-CoV-2 by FDA under an Emergency Use Authorization (EUA). This EUA will remain in effect (meaning this test can be used) for the duration of the COVID-19 declaration under Section 564(b)(1) of the Act, 21 U.S.C. section 360bbb-3(b)(1), unless the authorization is terminated or revoked.  Performed at [facility], [address] [place], [place] [address]   MRSA Next Gen by PCR, Nasal     Status: None   Collection Time: 06/18/22  7:12 PM   Specimen: Nasal Mucosa; Nasal Swab  Result Value Ref Range Status   MRSA by PCR Next Gen NOT DETECTED NOT DETECTED Final    Comment: (NOTE) The GeneXpert MRSA  Assay (FDA approved for NASAL specimens only), is one component of Eulalia Ellerman comprehensive MRSA colonization surveillance program. It is not intended to diagnose MRSA infection nor to guide or monitor treatment for MRSA infections. Test performance is not FDA approved in patients less than 28 years old. Performed at [facility], [address], [place], [place] [address]   Respiratory (~20 pathogens) panel by PCR     Status: Abnormal   Collection Time: 06/19/22  8:34 AM   Specimen: Nasopharyngeal Swab; Respiratory  Result Value Ref Range Status   Adenovirus NOT DETECTED NOT DETECTED Final   Coronavirus 229E NOT DETECTED NOT DETECTED Final    Comment: (NOTE) The Coronavirus on the Respiratory Panel, DOES NOT test for the novel  Coronavirus (2019 nCoV)    Coronavirus HKU1 NOT DETECTED NOT DETECTED Final   Coronavirus NL63 NOT DETECTED NOT DETECTED Final   Coronavirus OC43 NOT DETECTED NOT DETECTED Final   Metapneumovirus NOT DETECTED NOT DETECTED Final   Rhinovirus / Enterovirus DETECTED (Lasaro Primm) NOT DETECTED Final   Influenza Bren Steers NOT DETECTED NOT DETECTED Final   Influenza B NOT DETECTED NOT DETECTED Final   Parainfluenza Virus 1 NOT DETECTED NOT DETECTED Final   Parainfluenza Virus 2 NOT DETECTED NOT DETECTED Final   Parainfluenza Virus 3 NOT DETECTED NOT DETECTED Final   Parainfluenza Virus 4 NOT DETECTED NOT DETECTED Final   Respiratory Syncytial Virus NOT DETECTED NOT DETECTED Final   Bordetella pertussis NOT DETECTED NOT DETECTED Final   Bordetella Parapertussis NOT DETECTED NOT DETECTED Final   Chlamydophila pneumoniae NOT DETECTED NOT DETECTED Final   Mycoplasma pneumoniae NOT DETECTED NOT DETECTED Final    Comment: Performed  at [facility], [address] [place], [place] [address]  Expectorated Sputum Assessment w Gram Stain, Rflx to Resp Cult     Status: None   Collection Time: 06/19/22  8:41 AM   Specimen: Expectorated Sputum  Result Value Ref Range Status    Specimen Description EXPECTORATED SPUTUM  Final   Special Requests NONE  Final   Sputum evaluation   Final    THIS SPECIMEN IS ACCEPTABLE FOR SPUTUM CULTURE Performed at [facility], [address], [place], [place] [address]    Report Status 06/20/2022 FINAL  Final  Culture, Respiratory w Gram Stain     Status: None (Preliminary result)   Collection Time: 06/19/22  8:41 AM  Result Value Ref Range Status   Specimen Description   Final    EXPECTORATED SPUTUM Performed at [facility], [address], [place], [place] [address]    Special Requests   Final    NONE Reflexed from [id number] Performed at [facility], [address], [place], [place] [address]    Gram Stain   Final    FEW WBC PRESENT, PREDOMINANTLY MONONUCLEAR RARE GRAM NEGATIVE COCCOBACILLI    Culture   Final    RARE CANDIDA ALBICANS RARE STAPHYLOCOCCUS AUREUS SUSCEPTIBILITIES TO FOLLOW Performed at [facility], [address], [place], [place] [address]    Report Status PENDING  Incomplete     Labs: Basic Metabolic Panel: Recent Labs  Lab 06/20/22 0313 06/21/22 0616 06/22/22 0651 06/23/22 0511 06/24/22 0522  NA 134* 135 135 133* 136  K 4.3 3.6 3.9 4.1 3.9  CL 102 99 98 96* 96*  CO2 25 27 30 29  33*  GLUCOSE 186* 163* 142* 183* 143*  BUN 21 21 21  24* 25*  CREATININE 0.98 1.01 0.99 0.92 1.04  CALCIUM 8.6* 8.8* 8.6* 8.8* 9.1  MG 2.2 2.0 1.9 2.0 2.0  PHOS 2.3* 3.3 3.1 3.6 4.2   Liver Function Tests: Recent Labs  Lab 06/18/22 1248 06/20/22 0313 06/24/22 0522  AST 24 16 18   ALT 15 16 33  ALKPHOS 92 59 55  BILITOT 0.8 0.2* 0.5  PROT 8.5* 7.1 7.4  ALBUMIN 4.2 3.2* 3.1*   No results for input(s): "LIPASE", "AMYLASE" in the last 168 hours. No results for input(s): "AMMONIA" in the last 168 hours. CBC: Recent Labs  Lab 06/18/22 1248 06/18/22 1255 06/20/22 0313 06/21/22 0616 06/22/22 0651 06/23/22 0511 06/24/22 0522  WBC 9.7   <  > 7.9 7.6 5.9 7.0 7.5  NEUTROABS 7.8*  --  6.4  --   --   --  5.2  HGB 14.4   < > 11.3* 11.4* 11.8* 12.3* 12.8*  HCT 43.5   < > 35.3* 34.3* 35.8* 37.0* 39.3  MCV 95.8   < > 98.6 96.6 96.2 96.1 95.9  PLT 151   < > 141* 145* 157 176 183   < > = values in this interval not displayed.   Cardiac Enzymes: No results for input(s): "CKTOTAL", "CKMB", "CKMBINDEX", "TROPONINI" in the last 168 hours. BNP: BNP (last 3 results) Recent Labs    06/21/22 0616 06/23/22 0511 06/24/22 0522  BNP 302.4* 82.4 59.9    ProBNP (last 3 results) No results for input(s): "PROBNP" in the last 8760 hours.  CBG: Recent Labs  Lab 06/23/22 0731 06/23/22 1203 06/23/22 1701 06/23/22 2141 06/24/22 0721  GLUCAP 143* 306* 283* 281* 168*       Signed:  Lacretia Nicks  MD.  [facility] 06/24/2022, 10:53 AM

## 2022-06-24 NOTE — Progress Notes (Signed)
Occupational Therapy Treatment Patient Details Name: Johnathan [name] MRN: [id number] DOB: 1932-06-10 Today's Date: 06/24/2022   History of present illness Patient is a 89 year old male who presented on 5/28 with cough and fatigue. Patient was admitted with CAP, acute on chronic hypoxic respiratory failure, and COPD exacerbation. PMH: GERD, HTN, DM II, HLD, essential tremor   OT comments  Patient was able to engage in functional activity tolerance on 3L/min and maintain O2 saturation 91% or above. Patient plans to d/c home today per patient report. Patient's discharge plan remains appropriate at this time. OT will continue to follow acutely.     Recommendations for follow up therapy are one component of a multi-disciplinary discharge planning process, led by the attending physician.  Recommendations may be updated based on patient status, additional functional criteria and insurance authorization.       Patient can return home with the following  Assistance with cooking/housework;Assist for transportation   Equipment Recommendations  None recommended by OT    Recommendations for Other Services      Precautions / Restrictions Precautions Precautions: Fall Precaution Comments: monitor O2 Restrictions Other Position/Activity Restrictions: O2 use at baseline 2 lts       Mobility Bed Mobility Overal bed mobility: Modified Independent                        Balance Overall balance assessment: Mild deficits observed, not formally tested         ADL either performed or assessed with clinical judgement   ADL Overall ADL's : Needs assistance/impaired     General ADL Comments: patient reported he was able to complete toileting, grooming and dressing tasks himself and declined to complete them during this session. patient was agreeable to participation in functional activity tolerance with 3L/min with patietn 91% after functional mobiltiy in hallway with mask in place with RW.  patient was educated on ECT. patient verbalized understanding.      Cognition Arousal/Alertness: Awake/alert Behavior During Therapy: WFL for tasks assessed/performed Overall Cognitive Status: Within Functional Limits for tasks assessed                       Pertinent Vitals/ Pain       Pain Assessment Pain Assessment: No/denies pain         Frequency  Min 1X/week        Progress Toward Goals  OT Goals(current goals can now be found in the care plan section)  Progress towards OT goals: Progressing toward goals     Plan Discharge plan remains appropriate       AM-PAC OT "6 Clicks" Daily Activity     Outcome Measure   Help from another person eating meals?: None Help from another person taking care of personal grooming?: None Help from another person toileting, which includes using toliet, bedpan, or urinal?: None Help from another person bathing (including washing, rinsing, drying)?: A Little Help from another person to put on and taking off regular upper body clothing?: None Help from another person to put on and taking off regular lower body clothing?: None 6 Click Score: 23    End of Session Equipment Utilized During Treatment: Oxygen;Rolling walker (2 wheels)  OT Visit Diagnosis: Unsteadiness on feet (R26.81)   Activity Tolerance Patient tolerated treatment well   Patient Left in chair;with call bell/phone within reach   Nurse Communication Mobility status        Time: 4098-1191 OT  Time Calculation (min): 27 min  Charges: OT General Charges $OT Visit: 1 Visit OT Treatments $Self Care/Home Management : 23-37 mins  Rosalio Loud, MS Acute Rehabilitation Department Office# [phone]   Selinda Flavin 06/24/2022, 9:23 AM

## 2022-06-24 NOTE — Progress Notes (Signed)
Physical Therapy Treatment Patient Details Name: Johnathan [name] MRN: [id number] DOB: Mar 26, 1932 Today's Date: 06/24/2022   History of Present Illness Patient is a 89 year old male who presented on 5/28 with cough and fatigue. Patient was admitted with CAP, acute on chronic hypoxic respiratory failure, and COPD exacerbation. PMH: GERD, HTN, DM II, HLD, essential tremor    PT Comments    General Comments: AxO x 3 very pleasant and motivated.  Plans to return home and resume his OP PT Pt was already OOB in recliner.  Assisted with amb in hallway went well.  General transfer comment: good use of hands to steady self and good safety awareness.  General Gait Details: first amb 40 feet with walker at Supervision level, then amb without any AD as prior to admit.  Pt did well at [facility].  Aletrnating gait and no overt LOB.  Avg sats with amb on 2 lts was 93%.  Only mild 1/4 dyspnea but Max coughing after. SATURATION QUALIFICATIONS: (This note is used to comply with regulatory documentation for home oxygen)   Patient Saturations on Room Air at Rest = 87%   Patient Saturations on Room Air while Ambulating = 83%   Patient Saturations on 2 Liters of oxygen while Ambulating 65 feet= 90%   Please briefly explain why patient needs home oxygen:  Pt requires supplemental oxygen to achieve therapeutic levels.     Recommendations for follow up therapy are one component of a multi-disciplinary discharge planning process, led by the attending physician.  Recommendations may be updated based on patient status, additional functional criteria and insurance authorization.  Follow Up Recommendations       Assistance Recommended at Discharge Set up Supervision/Assistance  Patient can return home with the following A little help with walking and/or transfers;A little help with bathing/dressing/bathroom;Assistance with cooking/housework;Assist for transportation;Help with stairs or ramp for entrance    Equipment Recommendations  None recommended by PT    Recommendations for Other Services       Precautions / Restrictions Precautions Precautions: Fall Precaution Comments: monitor O2 Restrictions Weight Bearing Restrictions: No Other Position/Activity Restrictions: O2 use at baseline 2 lts     Mobility  Bed Mobility               General bed mobility comments: OOB in recliner    Transfers Overall transfer level: Needs assistance Equipment used: Rolling walker (2 wheels), None Transfers: Sit to/from Stand Sit to Stand: Supervision           General transfer comment: good use of hands to steady self and good safety awareness    Ambulation/Gait Ambulation/Gait assistance: Supervision, Min guard Gait Distance (Feet): 80 Feet Assistive device: Rolling walker (2 wheels), None Gait Pattern/deviations: Step-through pattern, Trunk flexed Gait velocity: decreased     General Gait Details: first amb 40 feet with walker at Supervision level, then amb without any AD as prior to admit.  Pt did well at [facility] Assist.  Aletrnating gait and no overt LOB.  Avg sats with amb on 2 lts was 93%.  Only mild 1/4 dyspnea but Max coughing after.   Stairs             Wheelchair Mobility    Modified Rankin (Stroke Patients Only)       Balance  Cognition Arousal/Alertness: Awake/alert Behavior During Therapy: WFL for tasks assessed/performed Overall Cognitive Status: Within Functional Limits for tasks assessed                                 General Comments: AxO x 3 very pleasant and motivated.  Plans to return home and resume his OP PT        Exercises      General Comments        Pertinent Vitals/Pain Pain Assessment Pain Assessment: No/denies pain    Home Living Family/patient expects to be discharged to:: Private residence Living Arrangements: Non-relatives/Friends                       Prior Function            PT Goals (current goals can now be found in the care plan section) Progress towards PT goals: Progressing toward goals    Frequency    Min 1X/week      PT Plan Current plan remains appropriate    Co-evaluation              AM-PAC PT "6 Clicks" Mobility   Outcome Measure  Help needed turning from your back to your side while in a flat bed without using bedrails?: None Help needed moving from lying on your back to sitting on the side of a flat bed without using bedrails?: None Help needed moving to and from a bed to a chair (including a wheelchair)?: None Help needed standing up from a chair using your arms (e.g., wheelchair or bedside chair)?: None Help needed to walk in hospital room?: A Little Help needed climbing 3-5 steps with a railing? : A Little 6 Click Score: 22    End of Session Equipment Utilized During Treatment: Gait belt Activity Tolerance: Patient tolerated treatment well Patient left: in chair;with chair alarm set;with nursing/sitter in room;with call bell/phone within reach Nurse Communication: Mobility status PT Visit Diagnosis: Unsteadiness on feet (R26.81);Difficulty in walking, not elsewhere classified (R26.2)     Time: 1020-1045 PT Time Calculation (min) (ACUTE ONLY): 25 min  Charges:  $Gait Training: 8-22 mins $Therapeutic Activity: 8-22 mins                     Felecia Shelling  PTA Acute  Rehabilitation Services Office M-F          [phone]

## 2022-06-24 NOTE — Progress Notes (Signed)
PHYSICAL THERAPY  SATURATION QUALIFICATIONS: (This note is used to comply with regulatory documentation for home oxygen)  Patient Saturations on Room Air at Rest = 87%  Patient Saturations on Room Air while Ambulating = 83%  Patient Saturations on 2 Liters of oxygen while Ambulating 65 feet= 90%  Please briefly explain why patient needs home oxygen:  Pt requires supplemental oxygen to achieve therapeutic levels.  Felecia Shelling  PTA Acute  Rehabilitation Services Office M-F          [phone]

## 2022-06-25 ENCOUNTER — Encounter: Payer: Self-pay | Admitting: *Deleted

## 2022-06-25 ENCOUNTER — Telehealth: Payer: Self-pay | Admitting: *Deleted

## 2022-06-25 NOTE — Transitions of Care (Post Inpatient/ED Visit) (Signed)
06/25/2022  Name: Johnathan [name] MRN: [id number] DOB: 1932-11-23  Today's TOC FU Call Status: Today's TOC FU Call Status:: Successful TOC FU Call Competed TOC FU Call Complete Date: 06/25/22  Transition Care Management Follow-up Telephone Call Date of Discharge: 06/24/22 Discharge Facility: [facility]) Type of Discharge: Inpatient Admission Primary Inpatient Discharge Diagnosis:: Sepsis; Acute on chronic respiratory failure; COPD exacerbation How have you been since you were released from the hospital?: Better ("I am doing okay.  Going tomorrow to get my last cataract surgery done; [facility] [employer] [facility].  I live with my roommate, he is also feeling under the weather now") Any questions or concerns?: No  Items Reviewed: Did you receive and understand the discharge instructions provided?: Yes (thoroughly reviewed with patient who verbalizes good understanding of same) Medications obtained,verified, and reconciled?: Yes (Medications Reviewed) (Full medication reconciliation/ review completed; no concerns or discrepancies identified; confirmed patient obtained/ is taking all newly Rx'd medications as instructed; self-manages medications and denies questions/ concerns around medications today) Any new allergies since your discharge?: No Dietary orders reviewed?: Yes Type of Diet Ordered:: "As Healthy as possible" Do you have support at home?: Yes People in Home: friend(s) Name of Support/Comfort Primary Source: Reports independent in self-care activities; resides with rommate who assists as/ if needed/ indicated-- reports roommate is not in good health  Medications Reviewed Today: Medications Reviewed Today     Reviewed by Michaela Corner, RN (Registered Nurse) on 06/25/22 at 1426  Med List Status: <None>   Medication Order Taking? Sig Documenting Provider Last Dose Status Informant  albuterol (VENTOLIN HFA) 108 (90 Base) MCG/ACT inhaler [id number] Yes  Inhale 2 puffs into the lungs every 6 (six) hours as needed for wheezing or shortness of breath. Copland, Gwenlyn Found, MD Taking Active Self  amLODipine (NORVASC) 5 MG tablet [id number] Yes Take 1 tablet (5 mg total) by mouth daily. Copland, Gwenlyn Found, MD Taking Active Self  aspirin EC 81 MG tablet [id number] Yes Take 81 mg by mouth every morning. [provider] Taking Active Self  B Complex-C (SUPER B COMPLEX PO) [id number] Yes Take 1 tablet by mouth at bedtime. [provider] Taking Active Self  benzonatate (TESSALON) 200 MG capsule [id number] No Take 1 capsule (200 mg total) by mouth 3 (three) times daily as needed for cough.  Patient not taking: Reported on 06/03/2022   Clayborne Dana, NP Not Taking Active Self           Med Note Michaela Corner   Tue Jun 25, 2022  2:26 PM) 06/25/22: reports during [facility] call-- completed course, no longer taking  Flaxseed, Linseed, (FLAXSEED OIL) 1200 MG CAPS [id number] Yes Take 1,200 mg by mouth at bedtime. [provider] Taking Active Self  Fluticasone-Umeclidin-Vilant (TRELEGY ELLIPTA) 100-62.5-25 MCG/ACT AEPB [id number] Yes Inhale 1 puff into the lungs at bedtime. Glade Lloyd, MD Taking Active Self  furosemide (LASIX) 20 MG tablet [id number] No Take 1 tablet (20 mg total) by mouth daily as needed for edema.  Patient not taking: Reported on 06/18/2022   CoplandGwenlyn Found, MD Not Taking Active Self  Ginkgo Theodis Shove [id number] Yes Take 1 capsule by mouth 2 (two) times daily. [provider] Taking Active Self  guaiFENesin (MUCINEX) 600 MG 12 hr tablet [id number] Yes Take 2 tablets (1,200 mg total) by mouth 2 (two) times daily.  Patient taking differently: Take 600 mg by mouth 3 (three) times daily.   Hanley Ben,  Kshitiz, MD Taking Active Self  ketoconazole (NIZORAL) 2 % cream [id number] No Apply 1 Application topically daily. Use as needed for fungal infection  Patient not taking: Reported on 06/18/2022   Copland, Gwenlyn Found, MD Not  Taking Active Self  ketoconazole (NIZORAL) 2 % shampoo [id number] Yes Apply 1 Application topically 2 (two) times a week. [provider] Taking Active Self  loratadine (CLARITIN) 10 MG tablet [id number] Yes TAKE 1 TABLET EVERY DAY  Patient taking differently: Take 10 mg by mouth every morning.   Copland, Gwenlyn Found, MD Taking Active Self  meloxicam (MOBIC) 15 MG tablet [id number] Yes TAKE 1 TABLET BY MOUTH DAILY AS  NEEDED FOR JOINT PAIN  Patient taking differently: Take 15 mg by mouth in the morning.   Copland, Gwenlyn Found, MD Taking Active Self  metFORMIN (GLUCOPHAGE) 500 MG tablet [id number] Yes Take 0.5 tablets (250 mg total) by mouth daily. Copland, Gwenlyn Found, MD Taking Active Self  Misc Natural Products (HIMALAYAN GOJI PO) [id number] Yes Take 6 Doses by mouth daily. Goji berries [provider] Taking Active Self  Multiple Vitamin (MULTIVITAMIN WITH MINERALS) TABS tablet [id number] Yes Take 1 tablet by mouth daily with breakfast. [provider] Taking Active Self  omeprazole (PRILOSEC) 20 MG capsule [id number] Yes TAKE 1 CAPSULE BY MOUTH TWICE  DAILY  Patient taking differently: Take 20 mg by mouth 2 (two) times daily before a meal.   Copland, Gwenlyn Found, MD Taking Active Self  OXYGEN [id number] Yes Inhale 2 L/min into the lungs continuous. [provider] Taking Active Self  pravastatin (PRAVACHOL) 40 MG tablet [id number] Yes Take 0.5 tablets (20 mg total) by mouth daily. Copland, Gwenlyn Found, MD Taking Active Self  predniSONE (DELTASONE) 10 MG tablet [id number] Yes Take 4 tablets (40 mg total) by mouth daily for 2 days, THEN 3 tablets (30 mg total) daily for 2 days, THEN 2 tablets (20 mg total) daily for 2 days, THEN 1 tablet (10 mg total) daily for 2 days. Zigmund Daniel., MD Taking Active   primidone (MYSOLINE) 50 MG tablet [id number] Yes TAKE 1 TABLET BY MOUTH TWICE  DAILY Copland, Gwenlyn Found, MD Taking Active Self  SYSTANE COMPLETE PF 0.6 % SOLN [id number] Yes  Place 1 drop into the right eye in the morning, at noon, and at bedtime. [provider] Taking Active Self  tadalafil (CIALIS) 20 MG tablet [id number] Yes TAKE 1/2 TO 1 TAB po 2 HRS PRIOR TO SEXUAL ACTIVITY  Patient taking differently: Take 10-20 mg by mouth See admin instructions. Take 10-20 mg by mouth two hours prior to sexual activity   Joline Maxcy, MD Taking Active Self  terazosin (HYTRIN) 1 MG capsule [id number] Yes Take 1 capsule (1 mg total) by mouth 2 (two) times daily. Copland, Gwenlyn Found, MD Taking Active Self  traMADol (ULTRAM) 50 MG tablet [id number] Yes TAKE 1 TABLET BY MOUTH EVERY 12  HOURS AS NEEDED FOR MODERATE  PAIN  Patient taking differently: Take 50 mg by mouth every 12 (twelve) hours as needed (for back pain).   Copland, Gwenlyn Found, MD Taking Active Self  triamcinolone cream (KENALOG) 0.1 % [id number] Yes Apply 1 Application topically 2 (two) times daily as needed (rash/itching). Glade Lloyd, MD Taking Active Self  VITAMIN E PO [id number] Yes Take 1 capsule by mouth daily with breakfast. [provider] Taking Active Self            Home Care and Equipment/Supplies: Were Home Health Services Ordered?: No Any  new equipment or medical supplies ordered?: No  Functional Questionnaire: Do you need assistance with bathing/showering or dressing?: No Do you need assistance with meal preparation?: No Do you need assistance with eating?: No Do you have difficulty maintaining continence: No Do you need assistance with getting out of bed/getting out of a chair/moving?: No Do you have difficulty managing or taking your medications?: No  Follow up appointments reviewed: PCP Follow-up appointment confirmed?: Yes (care coordination outreach in real-time with scheduling care guide to successfully schedule hospital follow up PCP appointment 07/04/22) Date of PCP follow-up appointment?: 07/04/22 Follow-up Provider: PCP Specialist Hospital Follow-up appointment  confirmed?: NA (verified not indicated per hospital discharging provider discharge notes) Do you need transportation to your follow-up appointment?: No Do you understand care options if your condition(s) worsen?: Yes-patient verbalized understanding  SDOH Interventions Today    Flowsheet Row Most Recent Value  SDOH Interventions   Food Insecurity Interventions Intervention Not Indicated  Transportation Interventions Intervention Not Indicated  [drives self]      TOC Interventions Today    Flowsheet Row Most Recent Value  TOC Interventions   TOC Interventions Discussed/Reviewed TOC Interventions Discussed, Arranged PCP follow up less than 12 days/Care Guide scheduled      Interventions Today    Flowsheet Row Most Recent Value  Chronic Disease   Chronic disease during today's visit Chronic Obstructive Pulmonary Disease (COPD), Other  [sepsis/ COPD exacerbation,  on home O2 at baseline]  General Interventions   General Interventions Discussed/Reviewed General Interventions Discussed, Doctor Visits  Doctor Visits Discussed/Reviewed Doctor Visits Discussed, PCP  PCP/Specialist Visits Compliance with follow-up visit  Exercise Interventions   Exercise Discussed/Reviewed Exercise Discussed  [patient reports he regularly attends [employer] to work out- positive reinforcement provided]  Nutrition Interventions   Nutrition Discussed/Reviewed Nutrition Discussed  Pharmacy Interventions   Pharmacy Dicussed/Reviewed Pharmacy Topics Discussed  [Full medication review with updating medication list in EHR per patient report]      Caryl Pina, RN, BSN, CCRN Alumnus RN CM Care Coordination/ Transition of Care- [facility] Care Management [phone]: direct office

## 2022-06-27 ENCOUNTER — Encounter: Payer: 59 | Admitting: Physical Therapy

## 2022-07-01 ENCOUNTER — Ambulatory Visit: Payer: 59 | Attending: Family Medicine | Admitting: Physical Therapy

## 2022-07-01 ENCOUNTER — Encounter: Payer: Self-pay | Admitting: Physical Therapy

## 2022-07-01 DIAGNOSIS — R2689 Other abnormalities of gait and mobility: Secondary | ICD-10-CM | POA: Diagnosis not present

## 2022-07-01 DIAGNOSIS — M6281 Muscle weakness (generalized): Secondary | ICD-10-CM | POA: Diagnosis not present

## 2022-07-01 DIAGNOSIS — R2681 Unsteadiness on feet: Secondary | ICD-10-CM | POA: Diagnosis not present

## 2022-07-01 NOTE — Therapy (Signed)
OUTPATIENT PHYSICAL THERAPY RE-EVALUATION   Patient Name: Johnathan [name] MRN: [id number] DOB:Sep 10, 1932, 89 y.o., male Today's Date: 07/01/2022  END OF SESSION:  PT End of Session - 07/01/22 1024     Visit Number 4    Number of Visits 12    Date for PT Re-Evaluation 07/15/22    Authorization Type [employer] Dual Complete+ Medicaid    PT Start Time 1022    PT Stop Time 1053    PT Time Calculation (min) 31 min    Activity Tolerance Patient tolerated treatment well;Patient limited by fatigue    Behavior During Therapy WFL for tasks assessed/performed               Past Medical History:  Diagnosis Date   Anal fissure    COPD (chronic obstructive pulmonary disease) (HCC)    Diverticulosis    DM type 2 (diabetes mellitus, type 2) (HCC)    GERD (gastroesophageal reflux disease)    uses prilosec    Hypertension    Pneumonia 2018   Raynaud's disease    Shortness of breath dyspnea    WITH EXERTION   Sinusitis    Tremors of nervous system    Past Surgical History:  Procedure Laterality Date   COLONOSCOPY  03/27/2005   ESOPHAGOGASTRODUODENOSCOPY (EGD) WITH PROPOFOL N/A 02/08/2021   Procedure: ESOPHAGOGASTRODUODENOSCOPY (EGD) WITH PROPOFOL;  Surgeon: Rachael Fee, MD;  Location: [facility];  Service: Endoscopy;  Laterality: N/A;   HERNIA REPAIR     INGUINAL HERNIA REPAIR N/A 05/06/2019   Procedure: LAPAROSCOPIC RIGHT AND LEFT INGUINAL HERNIA(S) REPAIR;  Surgeon: Karie Soda, MD;  Location: [facility];  Service: General;  Laterality: N/A;   Patient Active Problem List   Diagnosis Date Noted   Severe sepsis (HCC) 06/18/2022   Hyperlipidemia associated with type 2 diabetes mellitus (HCC) 06/18/2022   Preop pulmonary/respiratory exam 04/09/2022   GERD (gastroesophageal reflux disease) 09/11/2021   Acute on chronic respiratory failure with hypoxia (HCC) 09/10/2021   Interstitial pulmonary disease (HCC) 03/19/2021   Hypertension associated with diabetes (HCC)  03/19/2021   Oral candidiasis 03/05/2021   Chronic respiratory failure with hypoxia (HCC) 03/05/2021   Trigger finger, right ring finger 02/13/2021   COPD with chronic bronchitis and emphysema (HCC) 04/26/2020   COVID-19 02/25/2020   Shingles 11/14/2019   Raynaud's syndrome 11/14/2019   Skin cancer 11/14/2019   Left inguinal hernia s/p lap repair w mesh 05/06/2019 05/06/2019   Recurrent right inguinal hernia s/p lap repair w mesh 05/06/2019 03/01/2019   Type 2 diabetes mellitus (HCC) 03/01/2019   Community acquired pneumonia of right lower lobe of lung 04/21/2014   COPD with acute exacerbation (HCC) 04/21/2014    PCP: Pearline Cables, MD  REFERRING PROVIDER: Pearline Cables, MD  REFERRING DIAG: R26.89 (ICD-10-CM) - Balance problem  THERAPY DIAG:  Unsteadiness on feet  Other abnormalities of gait and mobility  RATIONALE FOR EVALUATION AND TREATMENT: Rehabilitation  ONSET DATE: over last year  NEXT MD VISIT: 07/04/22   SUBJECTIVE:   SUBJECTIVE STATEMENT: Pt reports he was admitted to the hospital for 1 week due to pneumonia and exacerbation of his COPD on 06/18/22, initially in the ICU until they were able to taper his 02. He is currently on 3L O2 via [place] from his portable O2 concentrator with O2 sats at 97% and HR 85 bpm.  He has a persistent dry cough, but minimal to no productive cough. Since the hospitalization, he notes increased weakness and lack of  energy/no energy. He tried to go the [employer] to use the steam room and sauna since coming home, but found it very difficult to walk up the incline into the [facility]. No recent falls reported. He admits he has done very little in the way of breathing exercises since being discharge from the hospital. He still has the personal respiratory muscle trainers as well as an incentive spirometer but has not used these since being home. His reports his back pain has not been as much of an issue since the hospitalization.  PAIN:  Are you having  pain? No  PERTINENT HISTORY: From MD notes "History of emphysema, diabetes, COPD, hypertension, former smoker. [place] had been doing excellently for age until he got COVID in February 2022; he subsequently developed pulmonary fibrosis and is still using oxygen"  PRECAUTIONS: Fall  WEIGHT BEARING RESTRICTIONS: No  FALLS:  Has patient fallen in last 6 months? No  LIVING ENVIRONMENT: Lives with:  lives with roommate Lives in: House/apartment Stairs: No Has following equipment at home:  portable oxygen  OCCUPATION: retired  PLOF: Independent and Leisure: [facility] [employer]  PATIENT GOALS: be able to walk comfortable, can only walk short distances now.    OBJECTIVE:   DIAGNOSTIC FINDINGS: 06/27/21 DG R hip IMPRESSION: Degenerative changes in the right hip.  09/14/2017 Lumbar MRI IMPRESSION: 1. Diffuse grade 2 anterolisthesis at L5-S1 mild bilateral foraminal narrowing. 2. The most significant central canal stenosis is at L4-5 slight anterolisthesis results in uncovering of a broad-based disc protrusion. Mild subarticular and moderate bilateral foraminal stenosis is present. 3. Mild foraminal narrowing bilaterally at L2-3 secondary to a broad-based disc protrusion.  PATIENT SURVEYS:  ABC scale 67.5%  COGNITION: Overall cognitive status: Within functional limits for tasks assessed     SENSATION: WFL  MUSCLE LENGTH: Hamstrings: NT Quadriceps: NT  POSTURE: No Significant postural limitations  PALPATION: NT  LOWER EXTREMITY ROM: NT  LOWER EXTREMITY MMT:  5/5 bil except hip extension 4+/5 on eval  MMT Right 07/01/22 Left 07/01/22  Hip flexion 4+ 4+  Hip extension 4+ 4+  Hip abduction 4+ 4+  Hip adduction 4+ 4+  Hip internal rotation 5 5  Hip external rotation 4 4  Knee flexion 5 5  Knee extension 5 5  Ankle dorsiflexion 4+ 4+  Ankle plantarflexion    Ankle inversion    Ankle eversion     (Blank rows = not tested, tested in sitting on 07/01/22)  LOWER EXTREMITY  SPECIAL TESTS:  NT  FUNCTIONAL TESTS:  5 times sit to stand: 17 sec without UE assist.  2 minute walk test: NT Dynamic Gait Index:  NT MCTSIB: Condition 1: Avg of 3 trials: 30 sec, Condition 2: Avg of 3 trials: 30 sec, Condition 3: Avg of 3 trials: 30 sec, Condition 4: Avg of 3 trials: 30 sec, and Total Score: 120/120 6 minute walk test:  [facility] PT Assessment - 06/14/22 0001     6 Minute Walk- Baseline   BP (mmHg) 118/56    HR (bpm) 78    02 Sat (%RA) 96 %    Modified Borg Scale for Dyspnea 1- Very mild shortness of breath   Perceived Rate of Exertion (Borg) 6-      6 Minute walk- Post Test   BP (mmHg) 124/60    HR (bpm) 94    02 Sat (%RA) 89 %    Modified Borg Scale for Dyspnea 5- Strong or hard breathing    Perceived Rate of Exertion (Borg)  13- Somewhat hard      6 minute walk test results    Aerobic Endurance Distance Walked 781    Endurance additional comments 1 seated rest break at 2:50 lasting ~40 sec   GAIT: Distance walked: 23' Assistive device utilized: None Level of assistance: Complete Independence Comments: portable O2   TODAY'S TREATMENT:                                                                                                                              DATE:     07/01/22 Re-eval O2 sats at 97% and HR 85 bpm at rest on 3L O2 via [place] from portable oxygen concentrator LE MMT   06/14/22 THERAPEUTIC ACTIVITIES: 6 Minute Walk Test:  6 Minute Walk- Baseline   BP (mmHg) 118/56    HR (bpm) 78    02 Sat (%RA) 96 %    Modified Borg Scale for Dyspnea 1- Very mild shortness of breath   Perceived Rate of Exertion (Borg) 6-      6 Minute walk- Post Test   BP (mmHg) 124/60    HR (bpm) 94    02 Sat (%RA) 89 %    Modified Borg Scale for Dyspnea 5- Strong or hard breathing    Perceived Rate of Exertion (Borg) 13- Somewhat hard      6 minute walk test results    Aerobic Endurance Distance Walked 781    Endurance additional comments 1 seated rest break at  2:50 lasting ~40 sec   Training in use pt's personal respiratory muscle trainer  THERAPEUTIC EXERCISE: to improve flexibility, strength and mobility.  Verbal and tactile cues throughout for technique. BATCA leg press - B LE 25# 3 x 10 BATCA knee flexion - B LE 25# 3 x 10 BATCA knee extension - B LE 20# x 10, 15# x 5 (discontinued d/t c/o increased knee pain)   06/11/22  Therapeutic Activity:  further assessment of gait and balance.  Bike L1 x 5 min  DGI 17/24 BERG 49/56   PATIENT EDUCATION:  Education details:  Need for clearance from MD to resume PT and need for reassessment of standardized tests with update of goals and POC if cleared to resume PT Person educated: Patient Education method: Explanation Education comprehension: verbalized understanding  HOME EXERCISE PROGRAM: Access Code: [email or web address] URL: [email or web address] Date: 06/11/2022 Prepared by: Harrie Foreman  Exercises - Standing Single Leg Stance with Counter Support  - 1 x daily - 7 x weekly - 1 sets - 3 reps - 30 sec  hold  ASSESSMENT:  CLINICAL IMPRESSION: Since his last PT visit, Johnathan [name] was hospitalized from 06/18/2022 to 06/24/2022 for sepsis, community-acquired pneumonia of the right LL, acute on chronic respiratory failure with hypoxia, and COPD exacerbation. He is currently on 3L O2 via [place] from his portable O2 concentrator with O2 sats at 97% and HR 85 bpm at rest. Since the hospitalization, he notes increased weakness and very limited  energy, although MMT revealing only minor weakness relative to initial testing on initial PT eval on 06/03/22. We deferred endurance testing via the 2 or 6 minute walk tests today as he was unable to walk from the parking lot up to therapy, needing security to bring him up in a Staxi chair.  He is due to have his post-hospitalization follow-up with his PCP on Thursday, 07/04/2022, at which time he intends to inquire with MD whether he should be resuming PT or potentially  starting pulmonary rehab.  If cleared to resume PT, will plan to complete remainder of re-evaluation next visit on 07/08/2022.  In the meantime encouraged patient to utilize one of his personal respiratory muscle trainers and/or his incentive spirometer to promote improved lung function and pulmonary hygiene.  OBJECTIVE IMPAIRMENTS: Abnormal gait, decreased activity tolerance, decreased balance, decreased endurance, decreased mobility, difficulty walking, decreased strength, decreased safety awareness, and impaired perceived functional ability.   ACTIVITY LIMITATIONS: stairs, transfers, and locomotion level  PARTICIPATION LIMITATIONS: meal prep, cleaning, laundry, and community activity  PERSONAL FACTORS: Age and 3+ comorbidities: emphysema, diabetes, COPD, hypertension, former smoker  are also affecting patient's functional outcome.   REHAB POTENTIAL: Good  CLINICAL DECISION MAKING: Evolving/moderate complexity  EVALUATION COMPLEXITY: Moderate   GOALS: Goals reviewed with patient? Yes  SHORT TERM GOALS: Target date: 06/24/2022   Patient will be independent with initial HEP. Baseline:  Goal status: IN PROGRESS  07/01/22 - limited ability to complete training in initial HEP due to hospitalization  Patient will complete further balance assessments including DGI and BERG.  Baseline:  Goal status: MET  06/11/22  LONG TERM GOALS: Target date: 07/15/2022   Patient will be independent with advanced/ongoing HEP to improve outcomes and carryover.  Baseline:  Goal status: IN PROGRESS  2.  Patient will be able to ambulate 600' with LRAD with good safety to access community.  Baseline: NT Goal status: IN PROGRESS  3.  Patient will be able to step up/down curb safely with LRAD for safety with community ambulation.  Baseline: NT Goal status: IN PROGRESS   4.  Patient will demonstrate improved functional LE strength as demonstrated by 5x STS < 15 sec . Baseline: 17 seconds Goal status: IN  PROGRESS  5.  Patient will demonstrate at least 19/24 on DGI to improve gait stability and reduce risk for falls. Baseline: 06/11/22- 17/24 Goal status: IN PROGRESS  6.  Patient will score >49/56 on Berg Balance test to demonstrate safety without assistive device Baseline: 49/56 Goal status: IN PROGRESS  7.  Patient will report 80% or better on ABC scale to demonstrate improved functional ability. Baseline: 67.5% Goal status: IN PROGRESS   PLAN:  PT FREQUENCY: 1-2x/week  PT DURATION: 6 weeks  PLANNED INTERVENTIONS: Therapeutic exercises, Therapeutic activity, Neuromuscular re-education, Balance training, Gait training, Patient/Family education, Self Care, Joint mobilization, Stair training, Orthotic/Fit training, Moist heat, Manual therapy, and Re-evaluation  PLAN FOR NEXT SESSION: Complete re-eval if cleared to fully resume PT by MD and modify/update LTGs and POC as indicated; higher level balance exercises, review upper body strength training equipment ([facility])   Marry Guan, PT 07/01/2022, 11:31 AM

## 2022-07-01 NOTE — Progress Notes (Addendum)
[facility] [address], [address] [place], [place] [address] [phone]  Date:  07/04/2022   Name:  Johnathan [name]   DOB:  10/25/1932   MRN:  [id number]  PCP:  Pearline Cables, MD    Chief Complaint: Hospitalization Follow-up [facility] 5/25- 63 Severe Sepsis has completed Prednisone/Concerns/ questions: L great toe tenderness)   History of Present Illness:  Johnathan [name] is a 89 y.o. very pleasant male patient who presents with the following:  Patient seen today for follow-up from recent hospitalization-COPD exacerbation, pneumonia and sepsis  I last saw Johnathan [name] in April at which point he was stable: History of emphysema, diabetes, COPD, hypertension, former smoker. Johnathan [name] had been doing excellently for age until he got COVID in February 2022; he subsequently developed pulmonary fibrosis and is still using oxygen   Admit date: 06/18/2022 Discharge date: 6/3/204 Recommendations for Outpatient Follow-up:  Follow outpatient CBC/CMP  Follow final sputum culture outpatient  - growing staph aureus, awaiting sensitivities, if MSSA, should have been covered by ceftriaxone, if MRSA could consider abx based on how he's doing clinically (he's currently improved, will defer to outpatient setting) Needs repeat chest x ray outpatient  Follow long term o2 needs outpatient  Discharge Diagnoses:  Principal Problem:   Severe sepsis (HCC) Active Problems:   Community acquired pneumonia of right lower lobe of lung   COPD with acute exacerbation (HCC)   Type 2 diabetes mellitus (HCC)   Hypertension associated with diabetes (HCC)   Acute on chronic respiratory failure with hypoxia (HCC)   Hyperlipidemia associated with type 2 diabetes mellitus (HCC) History of present illness:  Johnathan [name] is a 89 y.o. male with medical history significant for COPD, chronic respiratory failure with hypoxia on 2 L O2 via [place], T2DM, HTN, HLD, GERD who presented  to the ED for evaluation of fatigue and cough.  Found to have evidence of CAP on CXR, currently admitted and being treated for acute on chronic hypoxic respiratory failure due to pneumonia and COPD exacerbation.  He's improved with abx, steroids, scheduled/prn nebs. See below for additional details Hospital Course:  Assessment and Plan: Acute on Chronic Hypoxic Respiratory Failure Severe Sepsis due to Community Acquired Pneumonia COPD with acute Exacerbation  Rhinovirus infection Met criteria for severe sepsis on admission Chronically on 2 L for his COPD Currently on 2 L, home o2 CXR with bibasilar opacities (improved on R and worsened on L), needs short term follow up imaging Negative covid, influenza, RSV Negative MRSA PCR RVP with rhinovirus  Sputum culture with gram negative coccobacilli -> rare candida albicans, rare staph aureus, not clear significance of staph, will discuss with ID -> given improvement, can follow outpatient, if MSSA would've been covered by staph) Blood cultures NGTD  urine strep negative, urine legionella negative Completed ceftriazone/azithro  scheduled/prn nebs (takes trelegy at home), steroid taper Wean O2 as tolerated Strict I/O, daily weights - net negative 6.3 L, hold further scheduled lasix for now  NSVT Continue tele Replace mag/k  T2DM Holding metformin Follow A1c 6.6 SSI - BG's worse with steroids, follow. Should improve once steroids d/c'd. Continue statin, aspirin HTN Resume amlodipine and lasix (reportedly not taking this, it's prn at home ) Essential Tremor Continue primidone HLD Continue statin, aspirin GERD Prilosec----------------------------------------------------  Lab Results  Component Value Date   HGBA1C 6.6 (H) 05/06/2022   He has been home for 10 days and feels like he is  making slow progress but is still not back to previous baseline He has noted left great toe pain for "weeks" when he touches it such as when he is  putting his socks on He has to go up a slight incline to get into his home, but his bedroom is on the ground floor, shower on the ground- floor  He is driving  He is able to get into a grocery store, will use the provided scooter to get around the store He has a HC placard for his car  Johnathan [name] does have some more distant relatives, he has several nieces and nephews.  No closer relatives.  He does note one of his nieces was checking in with staff during his recent hospital stay Johnathan [name] states he does have a DNR, but this is not posted at home.  He would like to have a [employer] form to take home     Patient Active Problem List   Diagnosis Date Noted   Severe sepsis (HCC) 06/18/2022   Hyperlipidemia associated with type 2 diabetes mellitus (HCC) 06/18/2022   Preop pulmonary/respiratory exam 04/09/2022   GERD (gastroesophageal reflux disease) 09/11/2021   Acute on chronic respiratory failure with hypoxia (HCC) 09/10/2021   Interstitial pulmonary disease (HCC) 03/19/2021   Hypertension associated with diabetes (HCC) 03/19/2021   Oral candidiasis 03/05/2021   Chronic respiratory failure with hypoxia (HCC) 03/05/2021   Trigger finger, right ring finger 02/13/2021   COPD with chronic bronchitis and emphysema (HCC) 04/26/2020   COVID-19 02/25/2020   Shingles 11/14/2019   Raynaud's syndrome 11/14/2019   Skin cancer 11/14/2019   Left inguinal hernia s/p lap repair w mesh 05/06/2019 05/06/2019   Recurrent right inguinal hernia s/p lap repair w mesh 05/06/2019 03/01/2019   Type 2 diabetes mellitus (HCC) 03/01/2019   Community acquired pneumonia of right lower lobe of lung 04/21/2014   COPD with acute exacerbation (HCC) 04/21/2014    Past Medical History:  Diagnosis Date   Anal fissure    COPD (chronic obstructive pulmonary disease) (HCC)    Diverticulosis    DM type 2 (diabetes mellitus, type 2) (HCC)    GERD (gastroesophageal reflux disease)    uses prilosec    Hypertension    Pneumonia  2018   Raynaud's disease    Shortness of breath dyspnea    WITH EXERTION   Sinusitis    Tremors of nervous system     Past Surgical History:  Procedure Laterality Date   COLONOSCOPY  03/27/2005   ESOPHAGOGASTRODUODENOSCOPY (EGD) WITH PROPOFOL N/A 02/08/2021   Procedure: ESOPHAGOGASTRODUODENOSCOPY (EGD) WITH PROPOFOL;  Surgeon: Rachael Fee, MD;  Location: [facility];  Service: Endoscopy;  Laterality: N/A;   HERNIA REPAIR     INGUINAL HERNIA REPAIR N/A 05/06/2019   Procedure: LAPAROSCOPIC RIGHT AND LEFT INGUINAL HERNIA(S) REPAIR;  Surgeon: Karie Soda, MD;  Location: [facility];  Service: General;  Laterality: N/A;    Social History   Tobacco Use   Smoking status: Former    Packs/day: 1.00    Years: 45.00    Additional pack years: 0.00    Total pack years: 45.00    Types: Cigarettes    Quit date: 01/21/1994    Years since quitting: 28.4   Smokeless tobacco: Never  Substance Use Topics   Alcohol use: Yes    Comment: a drink daily with dinner   Drug use: No    Family History  Problem Relation Age of Onset   Dementia Brother  Diabetes Brother    Heart attack Father    Dementia Father     No Known Allergies  Medication list has been reviewed and updated.  Current Outpatient Medications on File Prior to Visit  Medication Sig Dispense Refill   albuterol (VENTOLIN HFA) 108 (90 Base) MCG/ACT inhaler Inhale 2 puffs into the lungs every 6 (six) hours as needed for wheezing or shortness of breath. 18 g 2   amLODipine (NORVASC) 5 MG tablet Take 1 tablet (5 mg total) by mouth daily. 90 tablet 3   aspirin EC 81 MG tablet Take 81 mg by mouth every morning.     B Complex-C (SUPER B COMPLEX PO) Take 1 tablet by mouth at bedtime.     benzonatate (TESSALON) 200 MG capsule Take 1 capsule (200 mg total) by mouth 3 (three) times daily as needed for cough. (Patient not taking: Reported on 06/03/2022) 20 capsule 0   Flaxseed, Linseed, (FLAXSEED OIL) 1200 MG CAPS Take  1,200 mg by mouth at bedtime.     Fluticasone-Umeclidin-Vilant (TRELEGY ELLIPTA) 100-62.5-25 MCG/ACT AEPB Inhale 1 puff into the lungs at bedtime.     furosemide (LASIX) 20 MG tablet Take 1 tablet (20 mg total) by mouth daily as needed for edema. (Patient not taking: Reported on 06/18/2022) 30 tablet 0   Ginkgo Biloba EXTR Take 1 capsule by mouth 2 (two) times daily.     guaiFENesin (MUCINEX) 600 MG 12 hr tablet Take 2 tablets (1,200 mg total) by mouth 2 (two) times daily. (Patient taking differently: Take 600 mg by mouth 3 (three) times daily.) 60 tablet 0   ketoconazole (NIZORAL) 2 % cream Apply 1 Application topically daily. Use as needed for fungal infection (Patient not taking: Reported on 06/18/2022) 15 g 1   ketoconazole (NIZORAL) 2 % shampoo Apply 1 Application topically 2 (two) times a week.     loratadine (CLARITIN) 10 MG tablet TAKE 1 TABLET EVERY DAY (Patient taking differently: Take 10 mg by mouth every morning.) 90 tablet 1   meloxicam (MOBIC) 15 MG tablet TAKE 1 TABLET BY MOUTH DAILY AS  NEEDED FOR JOINT PAIN (Patient taking differently: Take 15 mg by mouth in the morning.) 90 tablet 3   metFORMIN (GLUCOPHAGE) 500 MG tablet Take 0.5 tablets (250 mg total) by mouth daily. 45 tablet 1   Misc Natural Products (HIMALAYAN GOJI PO) Take 6 Doses by mouth daily. Goji berries     Multiple Vitamin (MULTIVITAMIN WITH MINERALS) TABS tablet Take 1 tablet by mouth daily with breakfast.     omeprazole (PRILOSEC) 20 MG capsule TAKE 1 CAPSULE BY MOUTH TWICE  DAILY (Patient taking differently: Take 20 mg by mouth 2 (two) times daily before a meal.) 180 capsule 1   OXYGEN Inhale 2 L/min into the lungs continuous.     pravastatin (PRAVACHOL) 40 MG tablet Take 0.5 tablets (20 mg total) by mouth daily. 45 tablet 0   primidone (MYSOLINE) 50 MG tablet TAKE 1 TABLET BY MOUTH TWICE  DAILY 200 tablet 2   SYSTANE COMPLETE PF 0.6 % SOLN Place 1 drop into the right eye in the morning, at noon, and at bedtime.      tadalafil (CIALIS) 20 MG tablet TAKE 1/2 TO 1 TAB po 2 HRS PRIOR TO SEXUAL ACTIVITY (Patient taking differently: Take 10-20 mg by mouth See admin instructions. Take 10-20 mg by mouth two hours prior to sexual activity) 15 tablet 5   terazosin (HYTRIN) 1 MG capsule Take 1 capsule (1 mg total) by  mouth 2 (two) times daily. 180 capsule 0   traMADol (ULTRAM) 50 MG tablet TAKE 1 TABLET BY MOUTH EVERY 12  HOURS AS NEEDED FOR MODERATE  PAIN (Patient taking differently: Take 50 mg by mouth every 12 (twelve) hours as needed (for back pain).) 20 tablet 1   triamcinolone cream (KENALOG) 0.1 % Apply 1 Application topically 2 (two) times daily as needed (rash/itching).     VITAMIN E PO Take 1 capsule by mouth daily with breakfast.     No current facility-administered medications on file prior to visit.    Review of Systems:  As per HPI- otherwise negative.   Physical Examination: Vitals:   07/04/22 1303  BP: 122/70  Pulse: 87  Resp: 18  Temp: 97.6 F (36.4 C)  SpO2: 92%   Vitals:   There is no height or weight on file to calculate BMI. Ideal Body Weight:    GEN: no acute distress.  Elderly gentleman, seated in wheelchair.  Oxygen via nasal cannula 2 L HEENT: Atraumatic, Normocephalic.  Ears and Nose: No external deformity. CV: RRR, No M/G/R. No JVD. No thrill. No extra heart sounds. PULM: CTA B, no wheezes, crackles, rhonchi. No retractions. No resp. distress. No accessory muscle use. ABD: S, NT, ND EXTR: No c/c/e PSYCH: Normally interactive. Conversant.  Left great toenail is thickened and in-securely attached to the nailbed.  No sign of gout or infection  Assessment and Plan: Community acquired pneumonia, unspecified laterality - Plan: CBC, Comprehensive metabolic panel, DG Chest 2 [facility] discharge follow-up  COPD with chronic bronchitis and emphysema (HCC)  Type 2 diabetes mellitus without complication, without long-term current use of insulin (HCC)  Essential  hypertension  End stage chronic obstructive pulmonary disease (HCC) Patient seen today for follow-up from recent admission with pneumonia and sepsis.  He was treated success with antibiotics.  Reviewed blood cultures, he did have oxacillin susceptible staph Clinically he is stable to improving.  He is getting by okay at home, he is able to drive and getting groceries, etc.  He does not have much in the way of family support.  He is aware that his lung disease is severe, lifespan is limited at this point  Audree Bane form from the posted home Discussed his toenail.  The best solution would likely be to have this removed by podiatry.  For the time being he would prefer to put up with current discomfort  Will follow-up on blood work and chest x-ray as above Main appointment to see him in 2 months Recent A1c showed good control of diabetes, blood pressure controlled  Signed Abbe Amsterdam, MD  Received chest film, message to patient DG Chest 2 View  Result Date: 07/04/2022 CLINICAL DATA:  Sepsis.  Cough. EXAM: CHEST - 2 VIEW COMPARISON:  06/22/2022 FINDINGS: The cardiac silhouette, mediastinal and hilar contours are within normal limits and stable. Stable tortuosity and calcification of the thoracic aorta. Stable emphysematous changes. Streaky basilar scarring changes and probably some component atelectasis but no definite infiltrates or effusions. IMPRESSION: 1. Emphysematous changes and pulmonary scarring. 2. Streaky basilar scarring changes and subsegmental atelectasis. 3. No infiltrates or effusions. Electronically Signed   By: Rudie Meyer M.D.   On: 07/04/2022 14:34     Addnd 6/14- message to pt Results for orders placed or performed in visit on 07/04/22  CBC  Result Value Ref Range   WBC 7.7 4.0 - 10.5 K/uL   RBC 3.90 (L) 4.22 - 5.81 Mil/uL   Platelets  240.0 150.0 - 400.0 K/uL   Hemoglobin 12.4 (L) 13.0 - 17.0 g/dL   HCT 16.1 (L) 09.6 - 04.5 %   MCV 95.8 78.0 - 100.0 fl   MCHC  33.2 30.0 - 36.0 g/dL   RDW 40.9 81.1 - 91.4 %  Comprehensive metabolic panel  Result Value Ref Range   Sodium 135 135 - 145 mEq/L   Potassium 4.6 3.5 - 5.1 mEq/L   Chloride 100 96 - 112 mEq/L   CO2 27 19 - 32 mEq/L   Glucose, Bld 75 70 - 99 mg/dL   BUN 27 (H) 6 - 23 mg/dL   Creatinine, Ser 7.82 0.40 - 1.50 mg/dL   Total Bilirubin 0.3 0.2 - 1.2 mg/dL   Alkaline Phosphatase 71 39 - 117 U/L   AST 12 0 - 37 U/L   ALT 12 0 - 53 U/L   Total Protein 7.1 6.0 - 8.3 g/dL   Albumin 3.6 3.5 - 5.2 g/dL   GFR 95.62 >13.08 mL/min   Calcium 9.0 8.4 - 10.5 mg/dL

## 2022-07-01 NOTE — Patient Instructions (Incomplete)
It was good to see you again today, I am glad you are feeling better I will be in touch with your labs and your chest x-ray   I will see you in about 2 months Let me know if you need anything in the meantime!

## 2022-07-04 ENCOUNTER — Ambulatory Visit (INDEPENDENT_AMBULATORY_CARE_PROVIDER_SITE_OTHER): Payer: 59 | Admitting: Family Medicine

## 2022-07-04 ENCOUNTER — Ambulatory Visit (HOSPITAL_BASED_OUTPATIENT_CLINIC_OR_DEPARTMENT_OTHER)
Admission: RE | Admit: 2022-07-04 | Discharge: 2022-07-04 | Disposition: A | Discharge status: Home / Self Care | Payer: 59 | Source: Ambulatory Visit | Attending: Family Medicine | Admitting: Family Medicine

## 2022-07-04 ENCOUNTER — Encounter: Payer: Self-pay | Admitting: Family Medicine

## 2022-07-04 VITALS — BP 122/70 | HR 87 | Temp 97.6°F | Resp 18

## 2022-07-04 DIAGNOSIS — J449 Chronic obstructive pulmonary disease, unspecified: Secondary | ICD-10-CM

## 2022-07-04 DIAGNOSIS — J439 Emphysema, unspecified: Secondary | ICD-10-CM | POA: Diagnosis not present

## 2022-07-04 DIAGNOSIS — Z09 Encounter for follow-up examination after completed treatment for conditions other than malignant neoplasm: Secondary | ICD-10-CM | POA: Diagnosis not present

## 2022-07-04 DIAGNOSIS — J4489 Other specified chronic obstructive pulmonary disease: Secondary | ICD-10-CM | POA: Diagnosis not present

## 2022-07-04 DIAGNOSIS — E119 Type 2 diabetes mellitus without complications: Secondary | ICD-10-CM

## 2022-07-04 DIAGNOSIS — I10 Essential (primary) hypertension: Secondary | ICD-10-CM

## 2022-07-04 DIAGNOSIS — J189 Pneumonia, unspecified organism: Secondary | ICD-10-CM | POA: Insufficient documentation

## 2022-07-04 DIAGNOSIS — J440 Chronic obstructive pulmonary disease with acute lower respiratory infection: Secondary | ICD-10-CM | POA: Diagnosis not present

## 2022-07-04 DIAGNOSIS — Z20822 Contact with and (suspected) exposure to covid-19: Secondary | ICD-10-CM | POA: Diagnosis not present

## 2022-07-05 ENCOUNTER — Encounter: Payer: Self-pay | Admitting: Family Medicine

## 2022-07-05 LAB — CBC
HCT: 37.4 % — ABNORMAL LOW (ref 39.0–52.0)
Hemoglobin: 12.4 g/dL — ABNORMAL LOW (ref 13.0–17.0)
MCHC: 33.2 g/dL (ref 30.0–36.0)
MCV: 95.8 fl (ref 78.0–100.0)
Platelets: 240 10*3/uL (ref 150.0–400.0)
RBC: 3.9 Mil/uL — ABNORMAL LOW (ref 4.22–5.81)
RDW: 13.9 % (ref 11.5–15.5)
WBC: 7.7 10*3/uL (ref 4.0–10.5)

## 2022-07-05 LAB — COMPREHENSIVE METABOLIC PANEL
ALT: 12 U/L (ref 0–53)
AST: 12 U/L (ref 0–37)
Albumin: 3.6 g/dL (ref 3.5–5.2)
Alkaline Phosphatase: 71 U/L (ref 39–117)
BUN: 27 mg/dL — ABNORMAL HIGH (ref 6–23)
CO2: 27 mEq/L (ref 19–32)
Calcium: 9 mg/dL (ref 8.4–10.5)
Chloride: 100 mEq/L (ref 96–112)
Creatinine, Ser: 0.95 mg/dL (ref 0.40–1.50)
GFR: 71.86 mL/min (ref >60.00)
Glucose, Bld: 75 mg/dL (ref 70–99)
Potassium: 4.6 mEq/L (ref 3.5–5.1)
Sodium: 135 mEq/L (ref 135–145)
Total Bilirubin: 0.3 mg/dL (ref 0.2–1.2)
Total Protein: 7.1 g/dL (ref 6.0–8.3)

## 2022-07-08 ENCOUNTER — Ambulatory Visit: Payer: 59 | Admitting: Physical Therapy

## 2022-07-08 ENCOUNTER — Telehealth: Payer: Self-pay | Admitting: Family Medicine

## 2022-07-08 ENCOUNTER — Other Ambulatory Visit: Payer: Self-pay | Admitting: Family Medicine

## 2022-07-08 IMAGING — RF DG ESOPHAGUS
8 series · 24 of 24 positions shown · non-contrast
Comparison: None.

CLINICAL DATA: Dysphagia and globus sensation.

EXAM:
ESOPHOGRAM / BARIUM SWALLOW / BARIUM TABLET STUDY
TECHNIQUE: Combined double contrast and single contrast examination performed
using effervescent crystals, thick barium liquid, and thin barium
liquid. The patient was observed with fluoroscopy swallowing a 13 mm
barium sulphate tablet.
FLUOROSCOPY TIME:  Fluoroscopy Time:  Chest CT 04/28/2020
Radiation Exposure Index (if provided by the fluoroscopic device):
21 mGy
Number of Acquired Spot Images: 0

[Series 1: cp_standard · 0.35mm/px · 3 of 65 frames shown (1 of 8)]
[frame 4/65]
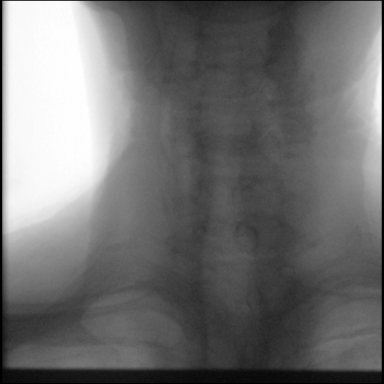
[frame 10/65]
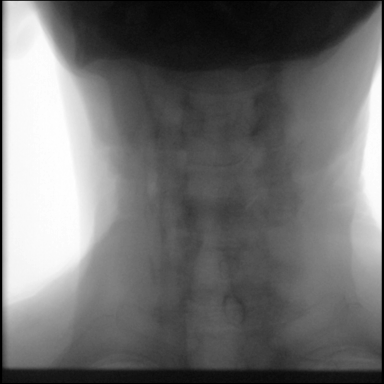
[frame 56/65]
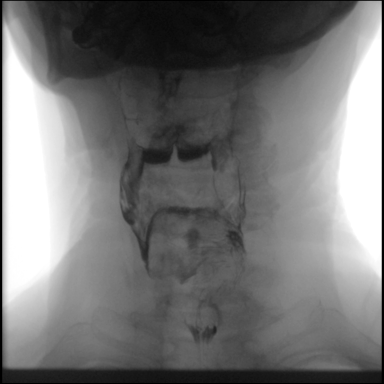

[Series 2: cp_standard · 0.35mm/px · 3 of 64 frames shown (2 of 8)]
[frame 8/64]
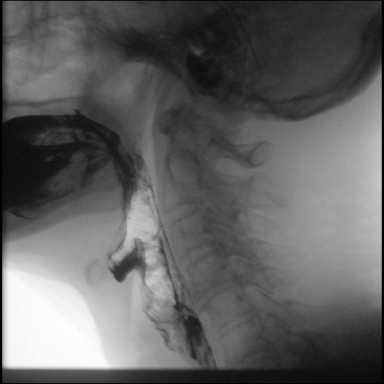
[frame 10/64]
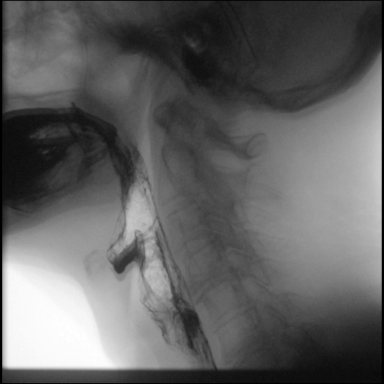
[frame 55/64]
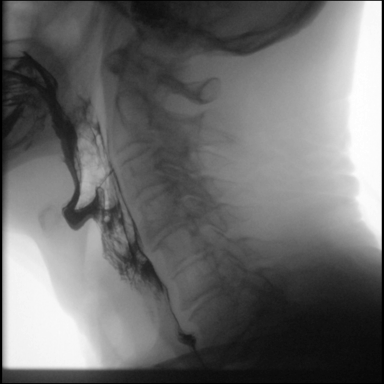

[Series 3: cp_standard · 0.35mm/px · 3 of 186 frames shown (3 of 8)]
[frame 1/186]
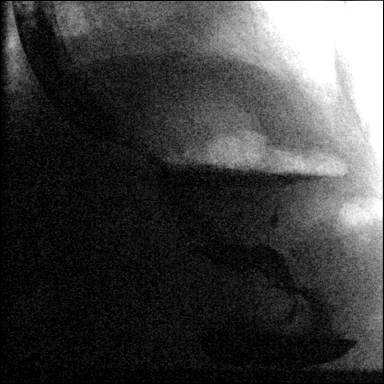
[frame 28/186]
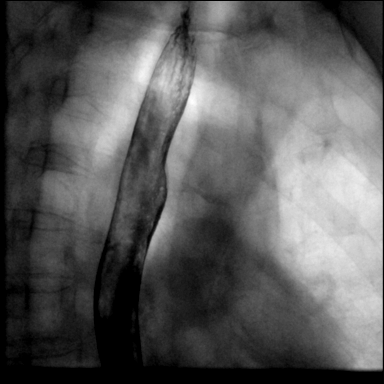
[frame 159/186]
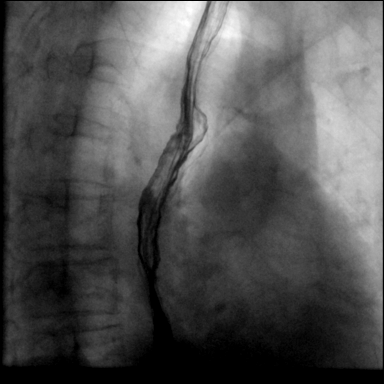

[Series 4: cp_standard · 0.34mm/px · 3 of 33 frames shown (4 of 8)]
[frame 5/33]
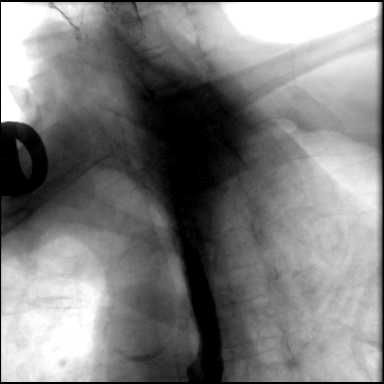
[frame 17/33]
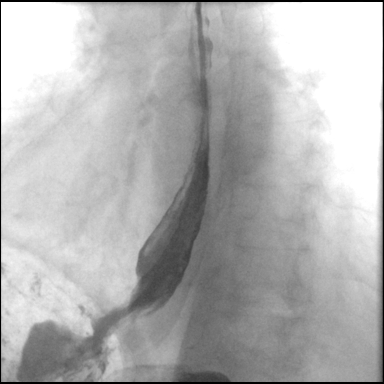
[frame 31/33]
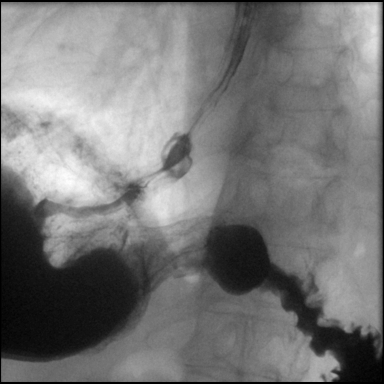

[Series 5: cp_standard · 0.35mm/px · 3 of 37 frames shown (5 of 8)]
[frame 6/37]
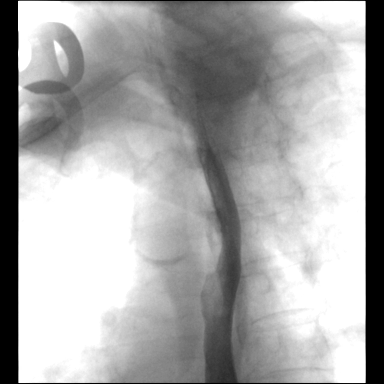
[frame 31/37]
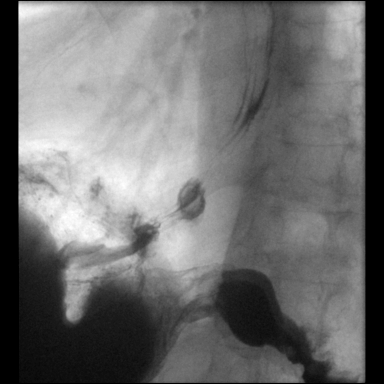
[frame 32/37]
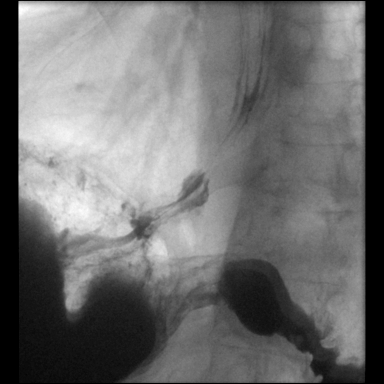

[Series 6: cp_standard · 0.35mm/px · 3 of 52 frames shown (6 of 8)]
[frame 8/52]
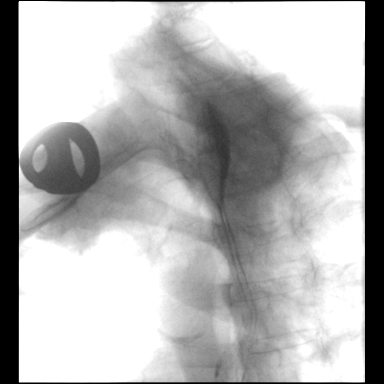
[frame 45/52]
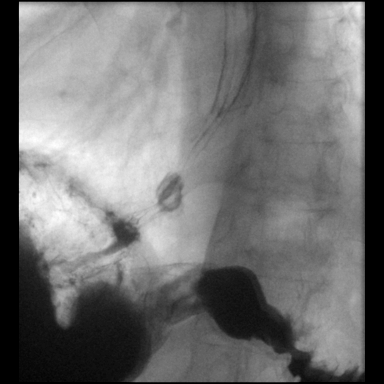
[frame 51/52]
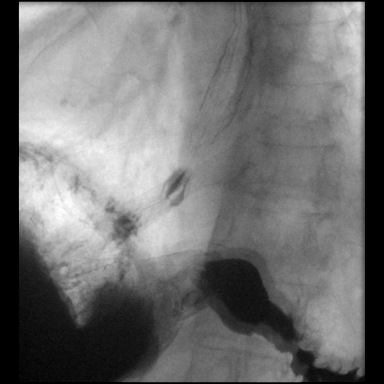

[Series 7: cp_standard · 0.35mm/px · 3 of 43 frames shown (7 of 8)]
[frame 7/43]
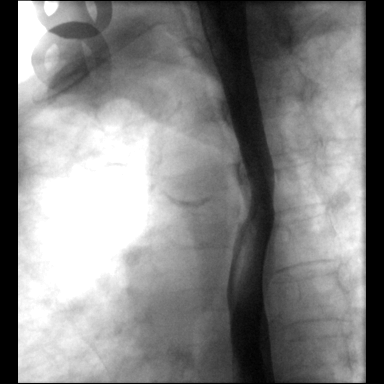
[frame 37/43]
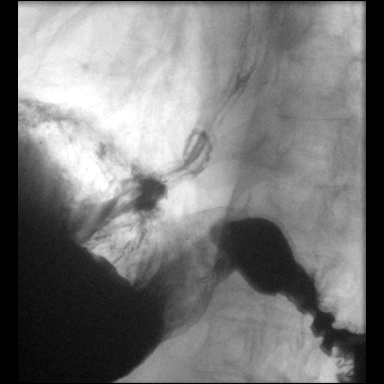
[frame 40/43]
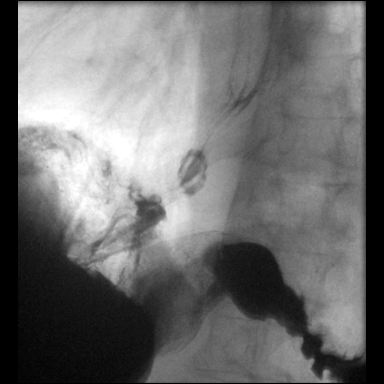

[Series 8: cp_standard · 0.35mm/px · 3 of 20 frames shown (8 of 8)]
[frame 4/20]
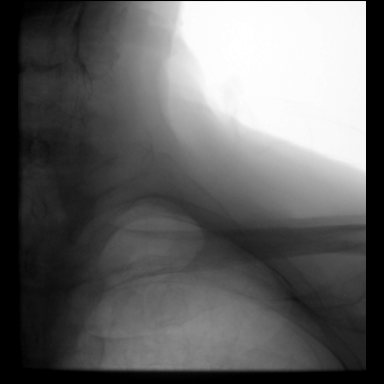
[frame 11/20]
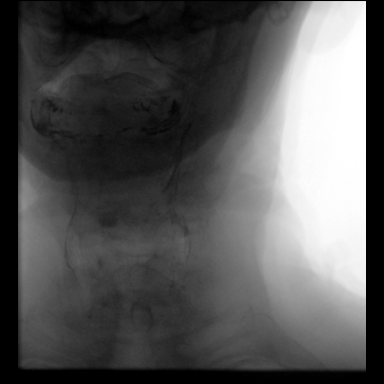
[frame 18/20]
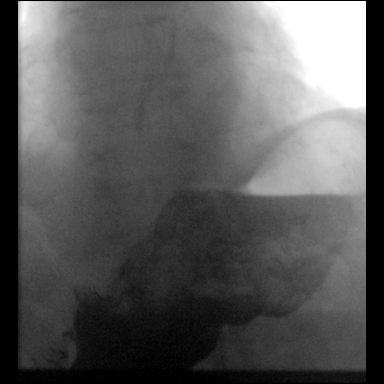

[24 of 24 positions shown; findings below may reference images not displayed]

FINDINGS: There is a sense of fullness and possible filling defect along the
pharyngoesophageal junction/proximal most cervical esophagus for
example on image 38 series 1 and image 59 series 2. A polypoid
lesion or mass is not well appreciated on the full column lateral
projection but is difficult to exclude on the mucosal relief
appearance.

Primary peristaltic waves in the esophagus were normal on [DATE]
swallows. There is a small type 1 hiatal hernia. Mild distal
esophageal fold thickening, which can sometimes be seen in the
setting of esophagitis. No stricture is appreciated. A 13 mm barium
tablet passed without difficulty into the stomach.

Atherosclerotic aortic arch.  Emphysema is present.
IMPRESSION: 1. Possible filling defect along the pharyngo esophageal
junction/proximal most cervical esophagus during the pharyngeal
phase of swallowing. This was inconstant, more conspicuous on the
frontal projection on lateral mucosal relieves rather than the
lateral full column view, but given the correlation with the
patient's globus sensation, endoscopy to assess the pharyngo
esophageal junction and potentially the proximal most portion of the
cervical esophagus is recommended to exclude a polyp or mass.
2. Small type 1 hiatal hernia.
3. Mild distal esophageal fold thickening which can sometimes
correlate with esophagitis. No esophageal stricture is identified.
4. Aortic Atherosclerosis (1UTL4-L5K.K) and Emphysema (1UTL4-Q7D.3).

## 2022-07-08 NOTE — Telephone Encounter (Signed)
Okay for refills.

## 2022-07-08 NOTE — Telephone Encounter (Signed)
Patient has called stating he needs a refill on HYDROCODONE and TRAMADOL.  Informed pt that his insurance sent in a script for his tramadol. He said the hydrocodone can be sent to his local [facility] pharmacy.  [facility] [address] [place], [place] - [address]    Last Visit: 6.13.24  Next Visit: 8.14.24

## 2022-07-09 MED ORDER — HYDROCODONE BIT-HOMATROP MBR 5-1.5 MG/5ML PO SOLN: 5.0000 mL | Freq: Three times a day (TID) | ORAL | 0 refills | Status: DC | PRN

## 2022-07-09 NOTE — Telephone Encounter (Signed)
Called him back- he has been on tramadol for back pain and I did RF yesterday I had not been rx hydrocodone for him- he notes "I am taking some from my roommate and just though I would slide some in under the wire" - he has been using this for cough.  Advised I can give him his own supply to use for cough, but do not combine with tramadol.  He states understanding JC

## 2022-07-12 ENCOUNTER — Other Ambulatory Visit: Payer: Self-pay | Admitting: Family Medicine

## 2022-07-16 ENCOUNTER — Other Ambulatory Visit: Payer: Self-pay

## 2022-07-16 ENCOUNTER — Ambulatory Visit: Payer: 59

## 2022-07-16 DIAGNOSIS — R2689 Other abnormalities of gait and mobility: Secondary | ICD-10-CM

## 2022-07-16 DIAGNOSIS — R2681 Unsteadiness on feet: Secondary | ICD-10-CM | POA: Diagnosis not present

## 2022-07-16 DIAGNOSIS — M6281 Muscle weakness (generalized): Secondary | ICD-10-CM | POA: Diagnosis not present

## 2022-07-16 NOTE — Therapy (Signed)
OUTPATIENT PHYSICAL THERAPY RE-EVALUATION   Patient Name: DEMETRIO LEIGHTY MRN: [id number] DOB:1932-11-02, 89 y.o., male Today's Date: 07/16/2022  END OF SESSION:  PT End of Session - 07/16/22 1706     Date for PT Re-Evaluation 09/10/22    Authorization Type [employer] Dual Complete+ Medicaid    Activity Tolerance Patient tolerated treatment well;Patient limited by fatigue    Behavior During Therapy [facility] for tasks assessed/performed               Past Medical History:  Diagnosis Date   Anal fissure    COPD (chronic obstructive pulmonary disease) (HCC)    Diverticulosis    DM type 2 (diabetes mellitus, type 2) (HCC)    GERD (gastroesophageal reflux disease)    uses prilosec    Hypertension    Pneumonia 2018   Raynaud's disease    Shortness of breath dyspnea    WITH EXERTION   Sinusitis    Tremors of nervous system    Past Surgical History:  Procedure Laterality Date   COLONOSCOPY  03/27/2005   ESOPHAGOGASTRODUODENOSCOPY (EGD) WITH PROPOFOL N/A 02/08/2021   Procedure: ESOPHAGOGASTRODUODENOSCOPY (EGD) WITH PROPOFOL;  Surgeon: Rachael Fee, MD;  Location: [facility];  Service: Endoscopy;  Laterality: N/A;   HERNIA REPAIR     INGUINAL HERNIA REPAIR N/A 05/06/2019   Procedure: LAPAROSCOPIC RIGHT AND LEFT INGUINAL HERNIA(S) REPAIR;  Surgeon: Karie Soda, MD;  Location: [facility];  Service: General;  Laterality: N/A;   Patient Active Problem List   Diagnosis Date Noted   Severe sepsis (HCC) 06/18/2022   Hyperlipidemia associated with type 2 diabetes mellitus (HCC) 06/18/2022   Preop pulmonary/respiratory exam 04/09/2022   GERD (gastroesophageal reflux disease) 09/11/2021   Acute on chronic respiratory failure with hypoxia (HCC) 09/10/2021   Interstitial pulmonary disease (HCC) 03/19/2021   Hypertension associated with diabetes (HCC) 03/19/2021   Oral candidiasis 03/05/2021   Chronic respiratory failure with hypoxia (HCC) 03/05/2021   Trigger finger, right  ring finger 02/13/2021   COPD with chronic bronchitis and emphysema (HCC) 04/26/2020   COVID-19 02/25/2020   Shingles 11/14/2019   Raynaud's syndrome 11/14/2019   Skin cancer 11/14/2019   Left inguinal hernia s/p lap repair w mesh 05/06/2019 05/06/2019   Recurrent right inguinal hernia s/p lap repair w mesh 05/06/2019 03/01/2019   Type 2 diabetes mellitus (HCC) 03/01/2019   Community acquired pneumonia of right lower lobe of lung 04/21/2014   COPD with acute exacerbation (HCC) 04/21/2014    PCP: Pearline Cables, MD  REFERRING PROVIDER: Pearline Cables, MD  REFERRING DIAG: R26.89 (ICD-10-CM) - Balance problem  THERAPY DIAG:  No diagnosis found.  RATIONALE FOR EVALUATION AND TREATMENT: Rehabilitation  ONSET DATE: over last year  NEXT MD VISIT: 07/04/22   SUBJECTIVE:   SUBJECTIVE STATEMENT:07/16/22:  The patient reports that he got so winded coming into the the clinic today that he is not sure he can keep today's appt. 07/01/22:Pt reports he was admitted to the hospital for 1 week due to pneumonia and exacerbation of his COPD on 06/18/22, initially in the ICU until they were able to taper his 02. He is currently on 3L O2 via [place] from his portable O2 concentrator with O2 sats at 97% and HR 85 bpm.  He has a persistent dry cough, but minimal to no productive cough. Since the hospitalization, he notes increased weakness and lack of energy/no energy. He tried to go the [employer] to use the steam room and sauna since coming home,  but found it very difficult to walk up the incline into the [facility]. No recent falls reported. He admits he has done very little in the way of breathing exercises since being discharge from the hospital. He still has the personal respiratory muscle trainers as well as an incentive spirometer but has not used these since being home. His reports his back pain has not been as much of an issue since the hospitalization.  PAIN:  Are you having pain? No  PERTINENT  HISTORY: From MD notes "History of emphysema, diabetes, COPD, hypertension, former smoker. Daaiel had been doing excellently for age until he got COVID in February 2022; he subsequently developed pulmonary fibrosis and is still using oxygen"  PRECAUTIONS: Fall  WEIGHT BEARING RESTRICTIONS: No  FALLS:  Has patient fallen in last 6 months? No  LIVING ENVIRONMENT: Lives with:  lives with roommate Lives in: House/apartment Stairs: No Has following equipment at home:  portable oxygen  OCCUPATION: retired  PLOF: Independent and Leisure: [facility] [employer]  PATIENT GOALS: be able to walk comfortable, can only walk short distances now.    OBJECTIVE:   DIAGNOSTIC FINDINGS: 06/27/21 DG R hip IMPRESSION: Degenerative changes in the right hip.  09/14/2017 Lumbar MRI IMPRESSION: 1. Diffuse grade 2 anterolisthesis at L5-S1 mild bilateral foraminal narrowing. 2. The most significant central canal stenosis is at L4-5 slight anterolisthesis results in uncovering of a broad-based disc protrusion. Mild subarticular and moderate bilateral foraminal stenosis is present. 3. Mild foraminal narrowing bilaterally at L2-3 secondary to a broad-based disc protrusion.  PATIENT SURVEYS:  ABC scale 67.5%  COGNITION: Overall cognitive status: Within functional limits for tasks assessed     SENSATION: WFL  MUSCLE LENGTH: Hamstrings: NT Quadriceps: NT  POSTURE: No Significant postural limitations  PALPATION: NT  LOWER EXTREMITY ROM: NT  LOWER EXTREMITY MMT:  5/5 bil except hip extension 4+/5 on eval  MMT Right 07/01/22 Left 07/01/22  Hip flexion 4+ 4+  Hip extension 4+ 4+  Hip abduction 4+ 4+  Hip adduction 4+ 4+  Hip internal rotation 5 5  Hip external rotation 4 4  Knee flexion 5 5  Knee extension 5 5  Ankle dorsiflexion 4+ 4+  Ankle plantarflexion    Ankle inversion    Ankle eversion     (Blank rows = not tested, tested in sitting on 07/01/22)  LOWER EXTREMITY SPECIAL TESTS:   NT  FUNCTIONAL TESTS:  5 times sit to stand: 17 sec without UE assist.  2 minute walk test: NT Dynamic Gait Index:  NT MCTSIB: Condition 1: Avg of 3 trials: 30 sec, Condition 2: Avg of 3 trials: 30 sec, Condition 3: Avg of 3 trials: 30 sec, Condition 4: Avg of 3 trials: 30 sec, and Total Score: 120/120 6 minute walk test:  [facility] PT Assessment - 06/14/22 0001     6 Minute Walk- Baseline   BP (mmHg) 118/56    HR (bpm) 78    02 Sat (%RA) 96 %    Modified Borg Scale for Dyspnea 1- Very mild shortness of breath   Perceived Rate of Exertion (Borg) 6-      6 Minute walk- Post Test   BP (mmHg) 124/60    HR (bpm) 94    02 Sat (%RA) 89 %    Modified Borg Scale for Dyspnea 5- Strong or hard breathing    Perceived Rate of Exertion (Borg) 13- Somewhat hard      6 minute walk test results    Aerobic Endurance  Distance Walked 781    Endurance additional comments 1 seated rest break at 2:50 lasting ~40 sec   GAIT: Distance walked: 30' Assistive device utilized: None Level of assistance: Complete Independence Comments: portable O2   TODAY'S TREATMENT:                                                                                                                              DATE:   07/15/22: re eval : Initially: O2 sat on 3L O2, at rest 97, BP 120/60 Performed 5 x sit to stand,  2 min walk test Berg balance assessment.  With rechecks of O2 saturation and breathing with each activity   07/01/22 Re-eval O2 sats at 97% and HR 85 bpm at rest on 3L O2 via [place] from portable oxygen concentrator LE MMT   06/14/22 THERAPEUTIC ACTIVITIES: 6 Minute Walk Test:  6 Minute Walk- Baseline   BP (mmHg) 118/56    HR (bpm) 78    02 Sat (%RA) 96 %    Modified Borg Scale for Dyspnea 1- Very mild shortness of breath   Perceived Rate of Exertion (Borg) 6-      6 Minute walk- Post Test   BP (mmHg) 124/60    HR (bpm) 94    02 Sat (%RA) 89 %    Modified Borg Scale for Dyspnea 5- Strong or hard  breathing    Perceived Rate of Exertion (Borg) 13- Somewhat hard      6 minute walk test results    Aerobic Endurance Distance Walked 781    Endurance additional comments 1 seated rest break at 2:50 lasting ~40 sec   Training in use pt's personal respiratory muscle trainer  THERAPEUTIC EXERCISE: to improve flexibility, strength and mobility.  Verbal and tactile cues throughout for technique. BATCA leg press - B LE 25# 3 x 10 BATCA knee flexion - B LE 25# 3 x 10 BATCA knee extension - B LE 20# x 10, 15# x 5 (discontinued d/t c/o increased knee pain)   06/11/22  Therapeutic Activity:  further assessment of gait and balance.  Bike L1 x 5 min  DGI 17/24 BERG 49/56   PATIENT EDUCATION:  Education details:  Need for clearance from MD to resume PT and need for reassessment of standardized tests with update of goals and POC if cleared to resume PT Person educated: Patient Education method: Explanation Education comprehension: verbalized understanding  HOME EXERCISE PROGRAM: Access Code: [email or web address] URL: [email or web address] Date: 06/11/2022 Prepared by: Harrie Foreman  Exercises - Standing Single Leg Stance with Counter Support  - 1 x daily - 7 x weekly - 1 sets - 3 reps - 30 sec  hold  ASSESSMENT:  CLINICAL IMPRESSION:  Mallory was hospitalized from 06/18/2022 to 06/24/2022 for sepsis, community-acquired pneumonia of the right LL, acute on chronic respiratory failure with hypoxia, and COPD exacerbation. He is currently on 2L O2 via [place] from his portable O2 concentrator .  Occasionally  increased the O2 to 3L when overexerted today. Since the hospitalization, he notes increased weakness and very limited energy.  He had lower scores today on the walking endurance test as well as BERG balance as compared to previous assessments prior to his hospitalization.  He does wish to continue to attend skilled PT to address his endurance and strength, activity tolerance.  We have extended  his plan of care for 8 more weeks.  He is to have cataract surgery next week so will have some interruption in his care.  Did discuss with him utilizing either home health PT or revisiting pulmonary rehab but he wishes to continue in the clinic for the next several weeks. OBJECTIVE IMPAIRMENTS: Abnormal gait, decreased activity tolerance, decreased balance, decreased endurance, decreased mobility, difficulty walking, decreased strength, decreased safety awareness, and impaired perceived functional ability.   ACTIVITY LIMITATIONS: stairs, transfers, and locomotion level  PARTICIPATION LIMITATIONS: meal prep, cleaning, laundry, and community activity  PERSONAL FACTORS: Age and 3+ comorbidities: emphysema, diabetes, COPD, hypertension, former smoker  are also affecting patient's functional outcome.   REHAB POTENTIAL: Good  CLINICAL DECISION MAKING: Evolving/moderate complexity  EVALUATION COMPLEXITY: Moderate   GOALS: Goals reviewed with patient? Yes  SHORT TERM GOALS: Target date: 06/24/2022   Patient will be independent with initial HEP. Baseline:  Goal status: IN PROGRESS  07/01/22 - limited ability to complete training in initial HEP due to hospitalization 07/16/22: limited due to hospitalization  Patient will complete further balance assessments including DGI and BERG.  Baseline:  Goal status: MET  06/11/22  LONG TERM GOALS: Target date: 09/10/2022   Patient will be independent with advanced/ongoing HEP to improve outcomes and carryover.  Baseline:  Goal status: IN PROGRESS   2.  Patient will be able to ambulate 600' with LRAD with good safety to access community.  Baseline: NT Goal status: IN PROGRESS 07/15/22:walked 2 min 4 sec, 270', pulse ox 88, recovered to 94 after seated rest 2 min  3.  Patient will be able to step up/down curb safely with LRAD for safety with community ambulation.  Baseline: NT Goal status: IN PROGRESS   4.  Patient will demonstrate improved functional  LE strength as demonstrated by 5x STS < 15 sec . Baseline: 17 seconds Goal status: IN PROGRESS 07/15/22:17 sec, pulse ox 94 on 2 L o2 Baseline: 06/11/22- 17/24 Goal status: IN PROGRESS  6.  Patient will score >49/56 on Berg Balance test to demonstrate safety without assistive device Baseline: 49/56 Goal status: IN PROGRESS 07/15/22: 42/56  7.  Patient will report 80% or better on ABC scale to demonstrate improved functional ability. Baseline: 67.5% Goal status: IN PROGRESS6/25/24: NT today due to time constraints   PLAN:  PT FREQUENCY: 1-2x/week  PT DURATION: 8 weeks  PLANNED INTERVENTIONS: Therapeutic exercises, Therapeutic activity, Neuromuscular re-education, Balance training, Gait training, Patient/Family education, Self Care, Joint mobilization, Stair training, Orthotic/Fit training, Moist heat, Manual therapy, and Re-evaluation  PLAN FOR NEXT SESSION: address endurance, strength training LE's high level balance  Noam Franzen L Khalia Gong, PT 07/16/2022, 5:07 PM

## 2022-07-18 ENCOUNTER — Ambulatory Visit: Payer: 59

## 2022-07-18 DIAGNOSIS — R2689 Other abnormalities of gait and mobility: Secondary | ICD-10-CM

## 2022-07-18 DIAGNOSIS — R2681 Unsteadiness on feet: Secondary | ICD-10-CM

## 2022-07-18 DIAGNOSIS — M6281 Muscle weakness (generalized): Secondary | ICD-10-CM | POA: Diagnosis not present

## 2022-07-18 NOTE — Therapy (Signed)
OUTPATIENT PHYSICAL THERAPY RE-EVALUATION   Patient Name: DONNIE PANIK MRN: [id number] DOB:[birth year], 89 y.o., male Today's Date: 07/18/2022  END OF SESSION:  PT End of Session - 07/18/22 1709     Visit Number 6    Number of Visits 12    Date for PT Re-Evaluation 09/10/22    Progress Note Due on Visit 10    PT Start Time 1547    PT Stop Time 1615    PT Time Calculation (min) 28 min    Activity Tolerance Patient tolerated treatment well;Patient limited by fatigue    Behavior During Therapy [facility] for tasks assessed/performed                Past Medical History:  Diagnosis Date   Anal fissure    COPD (chronic obstructive pulmonary disease) (HCC)    Diverticulosis    DM type 2 (diabetes mellitus, type 2) (HCC)    GERD (gastroesophageal reflux disease)    uses prilosec    Hypertension    Pneumonia 2018   Raynaud's disease    Shortness of breath dyspnea    WITH EXERTION   Sinusitis    Tremors of nervous system    Past Surgical History:  Procedure Laterality Date   COLONOSCOPY  03/27/2005   ESOPHAGOGASTRODUODENOSCOPY (EGD) WITH PROPOFOL N/A 02/08/2021   Procedure: ESOPHAGOGASTRODUODENOSCOPY (EGD) WITH PROPOFOL;  Surgeon: Rachael Fee, MD;  Location: [facility];  Service: Endoscopy;  Laterality: N/A;   HERNIA REPAIR     INGUINAL HERNIA REPAIR N/A 05/06/2019   Procedure: LAPAROSCOPIC RIGHT AND LEFT INGUINAL HERNIA(S) REPAIR;  Surgeon: Karie Soda, MD;  Location: [facility];  Service: General;  Laterality: N/A;   Patient Active Problem List   Diagnosis Date Noted   Severe sepsis (HCC) 06/18/2022   Hyperlipidemia associated with type 2 diabetes mellitus (HCC) 06/18/2022   Preop pulmonary/respiratory exam 04/09/2022   GERD (gastroesophageal reflux disease) 09/11/2021   Acute on chronic respiratory failure with hypoxia (HCC) 09/10/2021   Interstitial pulmonary disease (HCC) 03/19/2021   Hypertension associated with diabetes (HCC) 03/19/2021   Oral  candidiasis 03/05/2021   Chronic respiratory failure with hypoxia (HCC) 03/05/2021   Trigger finger, right ring finger 02/13/2021   COPD with chronic bronchitis and emphysema (HCC) 04/26/2020   COVID-19 02/25/2020   Shingles 11/14/2019   Raynaud's syndrome 11/14/2019   Skin cancer 11/14/2019   Left inguinal hernia s/p lap repair w mesh 05/06/2019 05/06/2019   Recurrent right inguinal hernia s/p lap repair w mesh 05/06/2019 03/01/2019   Type 2 diabetes mellitus (HCC) 03/01/2019   Community acquired pneumonia of right lower lobe of lung 04/21/2014   COPD with acute exacerbation (HCC) 04/21/2014    PCP: Pearline Cables, MD  REFERRING PROVIDER: Pearline Cables, MD  REFERRING DIAG: R26.89 (ICD-10-CM) - Balance problem  THERAPY DIAG:  Unsteadiness on feet  Other abnormalities of gait and mobility  RATIONALE FOR EVALUATION AND TREATMENT: Rehabilitation  ONSET DATE: over last year  NEXT MD VISIT: 07/04/22   SUBJECTIVE:   SUBJECTIVE STATEMENT:07/16/22:  The patient reports that he got so winded coming into the the clinic today that he is not sure he can keep today's appt. 07/01/22:Pt reports he was admitted to the hospital for 1 week due to pneumonia and exacerbation of his COPD on 06/18/22, initially in the ICU until they were able to taper his 02. He is currently on 3L O2 via [place] from his portable O2 concentrator with O2 sats at 97% and  HR 85 bpm.  He has a persistent dry cough, but minimal to no productive cough. Since the hospitalization, he notes increased weakness and lack of energy/no energy. He tried to go the [employer] to use the steam room and sauna since coming home, but found it very difficult to walk up the incline into the [facility]. No recent falls reported. He admits he has done very little in the way of breathing exercises since being discharge from the hospital. He still has the personal respiratory muscle trainers as well as an incentive spirometer but has not used these since  being home. His reports his back pain has not been as much of an issue since the hospitalization.  PAIN:  Are you having pain? No  PERTINENT HISTORY: From MD notes "History of emphysema, diabetes, COPD, hypertension, former smoker. Dayshon had been doing excellently for age until he got COVID in February 2022; he subsequently developed pulmonary fibrosis and is still using oxygen"  PRECAUTIONS: Fall  WEIGHT BEARING RESTRICTIONS: No  FALLS:  Has patient fallen in last 6 months? No  LIVING ENVIRONMENT: Lives with:  lives with roommate Lives in: House/apartment Stairs: No Has following equipment at home:  portable oxygen  OCCUPATION: retired  PLOF: Independent and Leisure: [facility] [employer]  PATIENT GOALS: be able to walk comfortable, can only walk short distances now.    OBJECTIVE:   DIAGNOSTIC FINDINGS: 06/27/21 DG R hip IMPRESSION: Degenerative changes in the right hip.  09/14/2017 Lumbar MRI IMPRESSION: 1. Diffuse grade 2 anterolisthesis at L5-S1 mild bilateral foraminal narrowing. 2. The most significant central canal stenosis is at L4-5 slight anterolisthesis results in uncovering of a broad-based disc protrusion. Mild subarticular and moderate bilateral foraminal stenosis is present. 3. Mild foraminal narrowing bilaterally at L2-3 secondary to a broad-based disc protrusion.  PATIENT SURVEYS:  ABC scale 67.5%  COGNITION: Overall cognitive status: Within functional limits for tasks assessed     SENSATION: WFL  MUSCLE LENGTH: Hamstrings: NT Quadriceps: NT  POSTURE: No Significant postural limitations  PALPATION: NT  LOWER EXTREMITY ROM: NT  LOWER EXTREMITY MMT:  5/5 bil except hip extension 4+/5 on eval  MMT Right 07/01/22 Left 07/01/22  Hip flexion 4+ 4+  Hip extension 4+ 4+  Hip abduction 4+ 4+  Hip adduction 4+ 4+  Hip internal rotation 5 5  Hip external rotation 4 4  Knee flexion 5 5  Knee extension 5 5  Ankle dorsiflexion 4+ 4+  Ankle plantarflexion     Ankle inversion    Ankle eversion     (Blank rows = not tested, tested in sitting on 07/01/22)  LOWER EXTREMITY SPECIAL TESTS:  NT  FUNCTIONAL TESTS:  5 times sit to stand: 17 sec without UE assist.  2 minute walk test: NT Dynamic Gait Index:  NT MCTSIB: Condition 1: Avg of 3 trials: 30 sec, Condition 2: Avg of 3 trials: 30 sec, Condition 3: Avg of 3 trials: 30 sec, Condition 4: Avg of 3 trials: 30 sec, and Total Score: 120/120 6 minute walk test:  [facility] PT Assessment - 06/14/22 0001     6 Minute Walk- Baseline   BP (mmHg) 118/56    HR (bpm) 78    02 Sat (%RA) 96 %    Modified Borg Scale for Dyspnea 1- Very mild shortness of breath   Perceived Rate of Exertion (Borg) 6-      6 Minute walk- Post Test   BP (mmHg) 124/60    HR (bpm) 94  02 Sat (%RA) 89 %    Modified Borg Scale for Dyspnea 5- Strong or hard breathing    Perceived Rate of Exertion (Borg) 13- Somewhat hard      6 minute walk test results    Aerobic Endurance Distance Walked 781    Endurance additional comments 1 seated rest break at 2:50 lasting ~40 sec   GAIT: Distance walked: 56' Assistive device utilized: None Level of assistance: Complete Independence Comments: portable O2   TODAY'S TREATMENT:                                                                                                                              DATE:  07/18/22: Decreased time of session today as patient 17 min late. Patient fatigued, SOB upon arrival, states out of breath walking  to car assessed pulse oxygen and at 88%.  Patient increased his supplemental on demand O2 to 3L from 2 and his oxygen sat increased to 93, so initiated session  B knee extension: 10#, 3 x 10 B knee flexion 20# 1 set 10, 25 # 2 sets 10 Nustep level 3, 2 min, Ox saturation 92%,then 2 min rest, and repeated 2 min O2 saturation at 89, up to 93 after 1 min pursed lip breathing. Seated rows low hands 15 #, 10 reps, 3 sets    07/15/22: re eval : Initially:  O2 sat on 3L O2, at rest 97, BP 120/60 Performed 5 x sit to stand,  2 min walk test Berg balance assessment.  With rechecks of O2 saturation and breathing with each activity   07/01/22 Re-eval O2 sats at 97% and HR 85 bpm at rest on 3L O2 via [place] from portable oxygen concentrator LE MMT   06/14/22 THERAPEUTIC ACTIVITIES: 6 Minute Walk Test:  6 Minute Walk- Baseline   BP (mmHg) 118/56    HR (bpm) 78    02 Sat (%RA) 96 %    Modified Borg Scale for Dyspnea 1- Very mild shortness of breath   Perceived Rate of Exertion (Borg) 6-      6 Minute walk- Post Test   BP (mmHg) 124/60    HR (bpm) 94    02 Sat (%RA) 89 %    Modified Borg Scale for Dyspnea 5- Strong or hard breathing    Perceived Rate of Exertion (Borg) 13- Somewhat hard      6 minute walk test results    Aerobic Endurance Distance Walked 781    Endurance additional comments 1 seated rest break at 2:50 lasting ~40 sec   Training in use pt's personal respiratory muscle trainer  THERAPEUTIC EXERCISE: to improve flexibility, strength and mobility.  Verbal and tactile cues throughout for technique. BATCA leg press - B LE 25# 3 x 10 BATCA knee flexion - B LE 25# 3 x 10 BATCA knee extension - B LE 20# x 10, 15# x 5 (discontinued d/t c/o increased knee pain)   06/11/22  Therapeutic Activity:  further assessment of gait and balance.  Bike L1 x 5 min  DGI 17/24 BERG 49/56   PATIENT EDUCATION:  Education details:  Need for clearance from MD to resume PT and need for reassessment of standardized tests with update of goals and POC if cleared to resume PT Person educated: Patient Education method: Explanation Education comprehension: verbalized understanding  HOME EXERCISE PROGRAM: Access Code: [email or web address] URL: [email or web address] Date: 06/11/2022 Prepared by: Harrie Foreman  Exercises - Standing Single Leg Stance with Counter Support  - 1 x daily - 7 x weekly - 1 sets - 3 reps - 30 sec   hold  ASSESSMENT:  CLINICAL IMPRESSION:  [place] returned today for skilled PT to build his strength , endurance, reduce his fall risk.  He did have some drops in his O2 saturation below 90 today initially, and with light endurance activities. Recovered fairly quickly.  Trying to address building muscle mass LE's and postural musculature to assist with his activity tolerance.  He had to maintain his supplemental O2 at 3 L vs 2 L today.  He is scheduled to see orthopedist tomorrow and to have cataract surgery next week.  To follow up with PCP in one month.  If his O2 continues to drop in the clnic wil reach out to his PCP regarding re eval in her office.  OBJECTIVE IMPAIRMENTS: Abnormal gait, decreased activity tolerance, decreased balance, decreased endurance, decreased mobility, difficulty walking, decreased strength, decreased safety awareness, and impaired perceived functional ability.   ACTIVITY LIMITATIONS: stairs, transfers, and locomotion level  PARTICIPATION LIMITATIONS: meal prep, cleaning, laundry, and community activity  PERSONAL FACTORS: Age and 3+ comorbidities: emphysema, diabetes, COPD, hypertension, former smoker  are also affecting patient's functional outcome.   REHAB POTENTIAL: Good  CLINICAL DECISION MAKING: Evolving/moderate complexity  EVALUATION COMPLEXITY: Moderate   GOALS: Goals reviewed with patient? Yes  SHORT TERM GOALS: Target date: 06/24/2022   Patient will be independent with initial HEP. Baseline:  Goal status: IN PROGRESS  07/01/22 - limited ability to complete training in initial HEP due to hospitalization 07/16/22: limited due to hospitalization  Patient will complete further balance assessments including DGI and BERG.  Baseline:  Goal status: MET  06/11/22  LONG TERM GOALS: Target date: 09/10/2022   Patient will be independent with advanced/ongoing HEP to improve outcomes and carryover.  Baseline:  Goal status: IN PROGRESS   2.  Patient will be able  to ambulate 600' with LRAD with good safety to access community.  Baseline: NT Goal status: IN PROGRESS 07/15/22:walked 2 min 4 sec, 270', pulse ox 88, recovered to 94 after seated rest 2 min  3.  Patient will be able to step up/down curb safely with LRAD for safety with community ambulation.  Baseline: NT Goal status: IN PROGRESS   4.  Patient will demonstrate improved functional LE strength as demonstrated by 5x STS < 15 sec . Baseline: 17 seconds Goal status: IN PROGRESS 07/15/22:17 sec, pulse ox 94 on 2 L o2 Baseline: 06/11/22- 17/24 Goal status: IN PROGRESS  6.  Patient will score >49/56 on Berg Balance test to demonstrate safety without assistive device Baseline: 49/56 Goal status: IN PROGRESS 07/15/22: 42/56  7.  Patient will report 80% or better on ABC scale to demonstrate improved functional ability. Baseline: 67.5% Goal status: IN PROGRESS6/25/24: NT today due to time constraints   PLAN:  PT FREQUENCY: 1-2x/week  PT DURATION: 8 weeks  PLANNED INTERVENTIONS: Therapeutic exercises, Therapeutic activity, Neuromuscular re-education, Balance training, Gait training,  Patient/Family education, Self Care, Joint mobilization, Stair training, Orthotic/Fit training, Moist heat, Manual therapy, and Re-evaluation  PLAN FOR NEXT SESSION: address endurance, strength training LE's high level balance  Jerrell Hart L Dayelin Balducci, PT 07/18/2022, 5:18 PM

## 2022-07-19 DIAGNOSIS — M48061 Spinal stenosis, lumbar region without neurogenic claudication: Secondary | ICD-10-CM | POA: Diagnosis not present

## 2022-07-19 DIAGNOSIS — M549 Dorsalgia, unspecified: Secondary | ICD-10-CM | POA: Diagnosis not present

## 2022-07-19 DIAGNOSIS — M5136 Other intervertebral disc degeneration, lumbar region: Secondary | ICD-10-CM | POA: Diagnosis not present

## 2022-07-22 ENCOUNTER — Ambulatory Visit: Payer: Medicare HMO | Admitting: Physical Therapy

## 2022-07-22 DIAGNOSIS — U071 COVID-19: Secondary | ICD-10-CM | POA: Diagnosis not present

## 2022-07-24 DIAGNOSIS — H2511 Age-related nuclear cataract, right eye: Secondary | ICD-10-CM | POA: Diagnosis not present

## 2022-07-24 LAB — HM DIABETES EYE EXAM

## 2022-08-01 ENCOUNTER — Ambulatory Visit: Payer: Medicare HMO | Attending: Family Medicine | Admitting: Physical Therapy

## 2022-08-01 DIAGNOSIS — R2681 Unsteadiness on feet: Secondary | ICD-10-CM | POA: Insufficient documentation

## 2022-08-01 DIAGNOSIS — R2689 Other abnormalities of gait and mobility: Secondary | ICD-10-CM | POA: Insufficient documentation

## 2022-08-02 ENCOUNTER — Other Ambulatory Visit: Payer: Self-pay

## 2022-08-02 ENCOUNTER — Telehealth: Payer: Self-pay | Admitting: Family Medicine

## 2022-08-02 DIAGNOSIS — I10 Essential (primary) hypertension: Secondary | ICD-10-CM

## 2022-08-02 MED ORDER — AMLODIPINE BESYLATE 5 MG PO TABS
5.0000 mg | ORAL_TABLET | Freq: Every day | ORAL | 3 refills | Status: DC
Start: 2022-08-02 — End: 2022-11-16

## 2022-08-02 MED ORDER — METFORMIN HCL 500 MG PO TABS: 250.0000 mg | ORAL_TABLET | Freq: Every day | ORAL | 3 refills | Status: DC

## 2022-08-02 NOTE — Telephone Encounter (Signed)
Medications have been refilled ?

## 2022-08-02 NOTE — Telephone Encounter (Signed)
Prescription Request  08/02/2022  Is this a "Controlled Substance" medicine? No  LOV: 07/04/2022  What is the name of the medication or equipment? amLODipine (NORVASC) 5 MG tablet [[id number]]   metFORMIN (GLUCOPHAGE) 500 MG tablet [[id number]]   Have you contacted your pharmacy to request a refill? Yes   Which pharmacy would you like this sent to?  [facility]   Patient notified that their request is being sent to the clinical staff for review and that they should receive a response within 2 business days.   Please advise at Mobile [phone] (mobile)

## 2022-08-04 ENCOUNTER — Other Ambulatory Visit: Payer: Self-pay | Admitting: Family Medicine

## 2022-08-05 ENCOUNTER — Ambulatory Visit: Payer: Medicare HMO | Admitting: Physical Therapy

## 2022-08-05 ENCOUNTER — Encounter: Payer: Self-pay | Admitting: Physical Therapy

## 2022-08-05 DIAGNOSIS — R2681 Unsteadiness on feet: Secondary | ICD-10-CM | POA: Diagnosis not present

## 2022-08-05 DIAGNOSIS — R2689 Other abnormalities of gait and mobility: Secondary | ICD-10-CM | POA: Diagnosis not present

## 2022-08-05 NOTE — Therapy (Signed)
OUTPATIENT PHYSICAL THERAPY TREATMENT   Patient Name: Johnathan [name] MRN: [id number] DOB:1932/12/03, 89 y.o., male Today's Date: 08/05/2022  END OF SESSION:  PT End of Session - 08/05/22 1455     Visit Number 7    Number of Visits 12    Date for PT Re-Evaluation 09/10/22    Authorization Type [employer] Dual Complete+ Medicaid    Progress Note Due on Visit 10    PT Start Time 1434    PT Stop Time 1512    PT Time Calculation (min) 38 min    Activity Tolerance Patient tolerated treatment well;Patient limited by fatigue    Behavior During Therapy WFL for tasks assessed/performed                 Past Medical History:  Diagnosis Date   Anal fissure    COPD (chronic obstructive pulmonary disease) (HCC)    Diverticulosis    DM type 2 (diabetes mellitus, type 2) (HCC)    GERD (gastroesophageal reflux disease)    uses prilosec    Hypertension    Pneumonia 2018   Raynaud's disease    Shortness of breath dyspnea    WITH EXERTION   Sinusitis    Tremors of nervous system    Past Surgical History:  Procedure Laterality Date   COLONOSCOPY  03/27/2005   ESOPHAGOGASTRODUODENOSCOPY (EGD) WITH PROPOFOL N/A 02/08/2021   Procedure: ESOPHAGOGASTRODUODENOSCOPY (EGD) WITH PROPOFOL;  Surgeon: Rachael Fee, MD;  Location: [facility];  Service: Endoscopy;  Laterality: N/A;   HERNIA REPAIR     INGUINAL HERNIA REPAIR N/A 05/06/2019   Procedure: LAPAROSCOPIC RIGHT AND LEFT INGUINAL HERNIA(S) REPAIR;  Surgeon: Karie Soda, MD;  Location: [facility];  Service: General;  Laterality: N/A;   Patient Active Problem List   Diagnosis Date Noted   Severe sepsis (HCC) 06/18/2022   Hyperlipidemia associated with type 2 diabetes mellitus (HCC) 06/18/2022   Preop pulmonary/respiratory exam 04/09/2022   GERD (gastroesophageal reflux disease) 09/11/2021   Acute on chronic respiratory failure with hypoxia (HCC) 09/10/2021   Interstitial pulmonary disease (HCC) 03/19/2021   Hypertension  associated with diabetes (HCC) 03/19/2021   Oral candidiasis 03/05/2021   Chronic respiratory failure with hypoxia (HCC) 03/05/2021   Trigger finger, right ring finger 02/13/2021   COPD with chronic bronchitis and emphysema (HCC) 04/26/2020   COVID-19 02/25/2020   Shingles 11/14/2019   Raynaud's syndrome 11/14/2019   Skin cancer 11/14/2019   Left inguinal hernia s/p lap repair w mesh 05/06/2019 05/06/2019   Recurrent right inguinal hernia s/p lap repair w mesh 05/06/2019 03/01/2019   Type 2 diabetes mellitus (HCC) 03/01/2019   Community acquired pneumonia of right lower lobe of lung 04/21/2014   COPD with acute exacerbation (HCC) 04/21/2014    PCP: Pearline Cables, MD  REFERRING PROVIDER: Pearline Cables, MD  REFERRING DIAG: R26.89 (ICD-10-CM) - Balance problem  THERAPY DIAG:  Unsteadiness on feet  Other abnormalities of gait and mobility  RATIONALE FOR EVALUATION AND TREATMENT: Rehabilitation  ONSET DATE: over last year  NEXT MD VISIT: 07/04/22   SUBJECTIVE:   SUBJECTIVE STATEMENT:  It was hard for me to walk in here from the parking lot, I feel OK now on 2LPM but I may turn it up to 3 depending. I'm suffering from low low energy levels, concerned about my balance too.  07/01/22:Pt reports he was admitted to the hospital for 1 week due to pneumonia and exacerbation of his COPD on 06/18/22, initially in the ICU  until they were able to taper his 02. He is currently on 3L O2 via [place] from his portable O2 concentrator with O2 sats at 97% and HR 85 bpm.  He has a persistent dry cough, but minimal to no productive cough. Since the hospitalization, he notes increased weakness and lack of energy/no energy. He tried to go the [employer] to use the steam room and sauna since coming home, but found it very difficult to walk up the incline into the [facility]. No recent falls reported. He admits he has done very little in the way of breathing exercises since being discharge from the hospital. He  still has the personal respiratory muscle trainers as well as an incentive spirometer but has not used these since being home. His reports his back pain has not been as much of an issue since the hospitalization.  PAIN:  Are you having pain? No 0/10  PERTINENT HISTORY: From MD notes "History of emphysema, diabetes, COPD, hypertension, former smoker. Aric had been doing excellently for age until he got COVID in February 2022; he subsequently developed pulmonary fibrosis and is still using oxygen"  PRECAUTIONS: Fall  WEIGHT BEARING RESTRICTIONS: No  FALLS:  Has patient fallen in last 6 months? No  LIVING ENVIRONMENT: Lives with:  lives with roommate Lives in: House/apartment Stairs: No Has following equipment at home:  portable oxygen  OCCUPATION: retired  PLOF: Independent and Leisure: [facility] to [employer]  PATIENT GOALS: be able to walk comfortable, can only walk short distances now.    OBJECTIVE:   DIAGNOSTIC FINDINGS: 06/27/21 DG R hip IMPRESSION: Degenerative changes in the right hip.  09/14/2017 Lumbar MRI IMPRESSION: 1. Diffuse grade 2 anterolisthesis at L5-S1 mild bilateral foraminal narrowing. 2. The most significant central canal stenosis is at L4-5 slight anterolisthesis results in uncovering of a broad-based disc protrusion. Mild subarticular and moderate bilateral foraminal stenosis is present. 3. Mild foraminal narrowing bilaterally at L2-3 secondary to a broad-based disc protrusion.  PATIENT SURVEYS:  ABC scale 67.5%  COGNITION: Overall cognitive status: Within functional limits for tasks assessed     SENSATION: WFL  MUSCLE LENGTH: Hamstrings: NT Quadriceps: NT  POSTURE: No Significant postural limitations  PALPATION: NT  LOWER EXTREMITY ROM: NT  LOWER EXTREMITY MMT:  5/5 bil except hip extension 4+/5 on eval  MMT Right 07/01/22 Left 07/01/22  Hip flexion 4+ 4+  Hip extension 4+ 4+  Hip abduction 4+ 4+  Hip adduction 4+ 4+  Hip internal rotation 5  5  Hip external rotation 4 4  Knee flexion 5 5  Knee extension 5 5  Ankle dorsiflexion 4+ 4+  Ankle plantarflexion    Ankle inversion    Ankle eversion     (Blank rows = not tested, tested in sitting on 07/01/22)  LOWER EXTREMITY SPECIAL TESTS:  NT  FUNCTIONAL TESTS:  5 times sit to stand: 17 sec without UE assist.  2 minute walk test: NT Dynamic Gait Index:  NT MCTSIB: Condition 1: Avg of 3 trials: 30 sec, Condition 2: Avg of 3 trials: 30 sec, Condition 3: Avg of 3 trials: 30 sec, Condition 4: Avg of 3 trials: 30 sec, and Total Score: 120/120 6 minute walk test:  [facility] PT Assessment - 06/14/22 0001     6 Minute Walk- Baseline   BP (mmHg) 118/56    HR (bpm) 78    02 Sat (%RA) 96 %    Modified Borg Scale for Dyspnea 1- Very mild shortness of breath   Perceived Rate  of Exertion (Borg) 6-      6 Minute walk- Post Test   BP (mmHg) 124/60    HR (bpm) 94    02 Sat (%RA) 89 %    Modified Borg Scale for Dyspnea 5- Strong or hard breathing    Perceived Rate of Exertion (Borg) 13- Somewhat hard      6 minute walk test results    Aerobic Endurance Distance Walked 781    Endurance additional comments 1 seated rest break at 2:50 lasting ~40 sec   GAIT: Distance walked: 85' Assistive device utilized: None Level of assistance: Complete Independence Comments: portable O2   TODAY'S TREATMENT:                                                                                                                              DATE:    08/05/22  SpO2 91-92% on 2LPM at rest   NMR  Tandem stance solid surface 3x30 seconds Narrow BOS on blue foam pad EO 3x30  Lateral step offs blue foam pad x10 B SpO2 88% on 2LPM after, recovered to 90% with PLB  Standing marches blue foam pad x20    TherEx  Nustep BLEs L3 x8 minutes for endurance training (92-93% on 2LPM)    07/18/22: Decreased time of session today as patient 17 min late. Patient fatigued, SOB upon arrival, states out of breath  walking  to car assessed pulse oxygen and at 88%.  Patient increased his supplemental on demand O2 to 3L from 2 and his oxygen sat increased to 93, so initiated session  B knee extension: 10#, 3 x 10 B knee flexion 20# 1 set 10, 25 # 2 sets 10 Nustep level 3, 2 min, Ox saturation 92%,then 2 min rest, and repeated 2 min O2 saturation at 89, up to 93 after 1 min pursed lip breathing. Seated rows low hands 15 #, 10 reps, 3 sets    07/15/22: re eval : Initially: O2 sat on 3L O2, at rest 97, BP 120/60 Performed 5 x sit to stand,  2 min walk test Berg balance assessment.  With rechecks of O2 saturation and breathing with each activity   07/01/22 Re-eval O2 sats at 97% and HR 85 bpm at rest on 3L O2 via [place] from portable oxygen concentrator LE MMT   06/14/22 THERAPEUTIC ACTIVITIES: 6 Minute Walk Test:  6 Minute Walk- Baseline   BP (mmHg) 118/56    HR (bpm) 78    02 Sat (%RA) 96 %    Modified Borg Scale for Dyspnea 1- Very mild shortness of breath   Perceived Rate of Exertion (Borg) 6-      6 Minute walk- Post Test   BP (mmHg) 124/60    HR (bpm) 94    02 Sat (%RA) 89 %    Modified Borg Scale for Dyspnea 5- Strong or hard breathing    Perceived Rate of Exertion (Borg) 13- Somewhat hard  6 minute walk test results    Aerobic Endurance Distance Walked 781    Endurance additional comments 1 seated rest break at 2:50 lasting ~40 sec   Training in use pt's personal respiratory muscle trainer  THERAPEUTIC EXERCISE: to improve flexibility, strength and mobility.  Verbal and tactile cues throughout for technique. BATCA leg press - B LE 25# 3 x 10 BATCA knee flexion - B LE 25# 3 x 10 BATCA knee extension - B LE 20# x 10, 15# x 5 (discontinued d/t c/o increased knee pain)   06/11/22  Therapeutic Activity:  further assessment of gait and balance.  Bike L1 x 5 min  DGI 17/24 BERG 49/56   PATIENT EDUCATION:  Education details:  Need for clearance from MD to resume PT and need for  reassessment of standardized tests with update of goals and POC if cleared to resume PT Person educated: Patient Education method: Explanation Education comprehension: verbalized understanding  HOME EXERCISE PROGRAM: Access Code: [email or web address] URL: [email or web address] Date: 06/11/2022 Prepared by: Harrie Foreman  Exercises - Standing Single Leg Stance with Counter Support  - 1 x daily - 7 x weekly - 1 sets - 3 reps - 30 sec  hold  ASSESSMENT:  CLINICAL IMPRESSION:  Vidyuth arrives today doing OK, still having issues with general endurance and balance. Spent most of session working on balance with rest breaks as appropriate. He does desaturate fairly quickly even on supplemental O2, rest breaks provided PRN  during session. Encouraged PLB and frequent pulse ox checks with activity. Will continue to progress as tolerated.   OBJECTIVE IMPAIRMENTS: Abnormal gait, decreased activity tolerance, decreased balance, decreased endurance, decreased mobility, difficulty walking, decreased strength, decreased safety awareness, and impaired perceived functional ability.   ACTIVITY LIMITATIONS: stairs, transfers, and locomotion level  PARTICIPATION LIMITATIONS: meal prep, cleaning, laundry, and community activity  PERSONAL FACTORS: Age and 3+ comorbidities: emphysema, diabetes, COPD, hypertension, former smoker  are also affecting patient's functional outcome.   REHAB POTENTIAL: Good  CLINICAL DECISION MAKING: Evolving/moderate complexity  EVALUATION COMPLEXITY: Moderate   GOALS: Goals reviewed with patient? Yes  SHORT TERM GOALS: Target date: 06/24/2022   Patient will be independent with initial HEP. Baseline:  Goal status: IN PROGRESS  07/01/22 - limited ability to complete training in initial HEP due to hospitalization 07/16/22: limited due to hospitalization  Patient will complete further balance assessments including DGI and BERG.  Baseline:  Goal status: MET   06/11/22  LONG TERM GOALS: Target date: 09/10/2022   Patient will be independent with advanced/ongoing HEP to improve outcomes and carryover.  Baseline:  Goal status: IN PROGRESS   2.  Patient will be able to ambulate 600' with LRAD with good safety to access community.  Baseline: NT Goal status: IN PROGRESS 07/15/22:walked 2 min 4 sec, 270', pulse ox 88, recovered to 94 after seated rest 2 min  3.  Patient will be able to step up/down curb safely with LRAD for safety with community ambulation.  Baseline: NT Goal status: IN PROGRESS   4.  Patient will demonstrate improved functional LE strength as demonstrated by 5x STS < 15 sec . Baseline: 17 seconds Goal status: IN PROGRESS 07/15/22:17 sec, pulse ox 94 on 2 L o2 Baseline: 06/11/22- 17/24 Goal status: IN PROGRESS  6.  Patient will score >49/56 on Berg Balance test to demonstrate safety without assistive device Baseline: 49/56 Goal status: IN PROGRESS 07/15/22: 42/56  7.  Patient will report 80% or better on ABC scale to demonstrate improved functional  ability. Baseline: 67.5% Goal status: IN PROGRESS6/25/24: NT today due to time constraints   PLAN:  PT FREQUENCY: 1-2x/week  PT DURATION: 8 weeks  PLANNED INTERVENTIONS: Therapeutic exercises, Therapeutic activity, Neuromuscular re-education, Balance training, Gait training, Patient/Family education, Self Care, Joint mobilization, Stair training, Orthotic/Fit training, Moist heat, Manual therapy, and Re-evaluation  PLAN FOR NEXT SESSION: address endurance, strength training LE's high level balance. Progress endurance work on [employer]?   Nedra Hai, PT, DPT 08/05/22 3:15 PM

## 2022-08-12 ENCOUNTER — Ambulatory Visit: Payer: Medicaid Other

## 2022-08-12 DIAGNOSIS — R2689 Other abnormalities of gait and mobility: Secondary | ICD-10-CM | POA: Diagnosis not present

## 2022-08-12 DIAGNOSIS — R2681 Unsteadiness on feet: Secondary | ICD-10-CM

## 2022-08-12 NOTE — Therapy (Signed)
OUTPATIENT PHYSICAL THERAPY TREATMENT   Patient Name: Johnathan [name] MRN: [id number] DOB:08-24-1932, 89 y.o., male Today's Date: 08/12/2022  END OF SESSION:  PT End of Session - 08/12/22 1623     Visit Number 8    Number of Visits 12    Date for PT Re-Evaluation 09/10/22    Authorization Type [employer] Dual Complete+ Medicaid    Progress Note Due on Visit 10    PT Start Time 1534    PT Stop Time 1619    PT Time Calculation (min) 45 min    Activity Tolerance Patient tolerated treatment well;Patient limited by fatigue    Behavior During Therapy WFL for tasks assessed/performed                  Past Medical History:  Diagnosis Date   Anal fissure    COPD (chronic obstructive pulmonary disease) (HCC)    Diverticulosis    DM type 2 (diabetes mellitus, type 2) (HCC)    GERD (gastroesophageal reflux disease)    uses prilosec    Hypertension    Pneumonia 2018   Raynaud's disease    Shortness of breath dyspnea    WITH EXERTION   Sinusitis    Tremors of nervous system    Past Surgical History:  Procedure Laterality Date   COLONOSCOPY  03/27/2005   ESOPHAGOGASTRODUODENOSCOPY (EGD) WITH PROPOFOL N/A 02/08/2021   Procedure: ESOPHAGOGASTRODUODENOSCOPY (EGD) WITH PROPOFOL;  Surgeon: Rachael Fee, MD;  Location: [facility];  Service: Endoscopy;  Laterality: N/A;   HERNIA REPAIR     INGUINAL HERNIA REPAIR N/A 05/06/2019   Procedure: LAPAROSCOPIC RIGHT AND LEFT INGUINAL HERNIA(S) REPAIR;  Surgeon: Karie Soda, MD;  Location: [facility] [place];  Service: General;  Laterality: N/A;   Patient Active Problem List   Diagnosis Date Noted   Severe sepsis (HCC) 06/18/2022   Hyperlipidemia associated with type 2 diabetes mellitus (HCC) 06/18/2022   Preop pulmonary/respiratory exam 04/09/2022   GERD (gastroesophageal reflux disease) 09/11/2021   Acute on chronic respiratory failure with hypoxia (HCC) 09/10/2021   Interstitial pulmonary disease (HCC) 03/19/2021    Hypertension associated with diabetes (HCC) 03/19/2021   Oral candidiasis 03/05/2021   Chronic respiratory failure with hypoxia (HCC) 03/05/2021   Trigger finger, right ring finger 02/13/2021   COPD with chronic bronchitis and emphysema (HCC) 04/26/2020   COVID-19 02/25/2020   Shingles 11/14/2019   Raynaud's syndrome 11/14/2019   Skin cancer 11/14/2019   Left inguinal hernia s/p lap repair w mesh 05/06/2019 05/06/2019   Recurrent right inguinal hernia s/p lap repair w mesh 05/06/2019 03/01/2019   Type 2 diabetes mellitus (HCC) 03/01/2019   Community acquired pneumonia of right lower lobe of lung 04/21/2014   COPD with acute exacerbation (HCC) 04/21/2014    PCP: Pearline Cables, MD  REFERRING PROVIDER: Pearline Cables, MD  REFERRING DIAG: R26.89 (ICD-10-CM) - Balance problem  THERAPY DIAG:  Unsteadiness on feet  Other abnormalities of gait and mobility  RATIONALE FOR EVALUATION AND TREATMENT: Rehabilitation  ONSET DATE: over last year  NEXT MD VISIT: 07/04/22   SUBJECTIVE:   SUBJECTIVE STATEMENT: Doing good today.  07/01/22:Pt reports he was admitted to the hospital for 1 week due to pneumonia and exacerbation of his COPD on 06/18/22, initially in the ICU until they were able to taper his 02. He is currently on 3L O2 via [place] from his portable O2 concentrator with O2 sats at 97% and HR 85 bpm.  He has a persistent dry cough,  but minimal to no productive cough. Since the hospitalization, he notes increased weakness and lack of energy/no energy. He tried to go the [employer] to use the steam room and sauna since coming home, but found it very difficult to walk up the incline into the [facility]. No recent falls reported. He admits he has done very little in the way of breathing exercises since being discharge from the hospital. He still has the personal respiratory muscle trainers as well as an incentive spirometer but has not used these since being home. His reports his back pain has not been  as much of an issue since the hospitalization.  PAIN:  Are you having pain? No 0/10  PERTINENT HISTORY: From MD notes "History of emphysema, diabetes, COPD, hypertension, former smoker. Johnathan [name] had been doing excellently for age until he got COVID in February 2022; he subsequently developed pulmonary fibrosis and is still using oxygen"  PRECAUTIONS: Fall  WEIGHT BEARING RESTRICTIONS: No  FALLS:  Has patient fallen in last 6 months? No  LIVING ENVIRONMENT: Lives with:  lives with roommate Lives in: House/apartment Stairs: No Has following equipment at home:  portable oxygen  OCCUPATION: retired  PLOF: Independent and Leisure: [facility] [employer]  PATIENT GOALS: be able to walk comfortable, can only walk short distances now.    OBJECTIVE:   DIAGNOSTIC FINDINGS: 06/27/21 DG R hip IMPRESSION: Degenerative changes in the right hip.  09/14/2017 Lumbar MRI IMPRESSION: 1. Diffuse grade 2 anterolisthesis at L5-S1 mild bilateral foraminal narrowing. 2. The most significant central canal stenosis is at L4-5 slight anterolisthesis results in uncovering of a broad-based disc protrusion. Mild subarticular and moderate bilateral foraminal stenosis is present. 3. Mild foraminal narrowing bilaterally at L2-3 secondary to a broad-based disc protrusion.  PATIENT SURVEYS:  ABC scale 67.5%  COGNITION: Overall cognitive status: Within functional limits for tasks assessed     SENSATION: WFL  MUSCLE LENGTH: Hamstrings: NT Quadriceps: NT  POSTURE: No Significant postural limitations  PALPATION: NT  LOWER EXTREMITY ROM: NT  LOWER EXTREMITY MMT:  5/5 bil except hip extension 4+/5 on eval  MMT Right 07/01/22 Left 07/01/22  Hip flexion 4+ 4+  Hip extension 4+ 4+  Hip abduction 4+ 4+  Hip adduction 4+ 4+  Hip internal rotation 5 5  Hip external rotation 4 4  Knee flexion 5 5  Knee extension 5 5  Ankle dorsiflexion 4+ 4+  Ankle plantarflexion    Ankle inversion    Ankle eversion      (Blank rows = not tested, tested in sitting on 07/01/22)  LOWER EXTREMITY SPECIAL TESTS:  NT  FUNCTIONAL TESTS:  5 times sit to stand: 17 sec without UE assist.  2 minute walk test: NT Dynamic Gait Index:  NT MCTSIB: Condition 1: Avg of 3 trials: 30 sec, Condition 2: Avg of 3 trials: 30 sec, Condition 3: Avg of 3 trials: 30 sec, Condition 4: Avg of 3 trials: 30 sec, and Total Score: 120/120 6 minute walk test:  [facility] PT Assessment - 06/14/22 0001     6 Minute Walk- Baseline   BP (mmHg) 118/56    HR (bpm) 78    02 Sat (%RA) 96 %    Modified Borg Scale for Dyspnea 1- Very mild shortness of breath   Perceived Rate of Exertion (Borg) 6-      6 Minute walk- Post Test   BP (mmHg) 124/60    HR (bpm) 94    02 Sat (%RA) 89 %    Modified  Borg Scale for Dyspnea 5- Strong or hard breathing    Perceived Rate of Exertion (Borg) 13- Somewhat hard      6 minute walk test results    Aerobic Endurance Distance Walked 781    Endurance additional comments 1 seated rest break at 2:50 lasting ~40 sec   GAIT: Distance walked: 65' Assistive device utilized: None Level of assistance: Complete Independence Comments: portable O2   TODAY'S TREATMENT:                                                                                                                              DATE:  08/12/22 THERAPEUTIC EXERCISE: to improve flexibility, strength and mobility.  Verbal and tactile cues throughout for technique. SpO2 96% on 2LPM at rest  Gait: 3 laps (266ft) SpO2- 89% recovered to 92% Nustep L5x51min STS x 10  Seated chop Yellow wt ball x 10; trunk rotation with yellow wt ball x 10  Knee extension 15# 2x10  Knee flexion 20# 2x10  Gait for 1 additional lap around the track  08/05/22  SpO2 91-92% on 2LPM at rest   NMR  Tandem stance solid surface 3x30 seconds Narrow BOS on blue foam pad EO 3x30  Lateral step offs blue foam pad x10 B SpO2 88% on 2LPM after, recovered to 90% with PLB  Standing  marches blue foam pad x20    TherEx  Nustep BLEs L3 x8 minutes for endurance training (92-93% on 2LPM)    07/18/22: Decreased time of session today as patient 17 min late. Patient fatigued, SOB upon arrival, states out of breath walking  to car assessed pulse oxygen and at 88%.  Patient increased his supplemental on demand O2 to 3L from 2 and his oxygen sat increased to 93, so initiated session  B knee extension: 10#, 3 x 10 B knee flexion 20# 1 set 10, 25 # 2 sets 10 Nustep level 3, 2 min, Ox saturation 92%,then 2 min rest, and repeated 2 min O2 saturation at 89, up to 93 after 1 min pursed lip breathing. Seated rows low hands 15 #, 10 reps, 3 sets    07/15/22: re eval : Initially: O2 sat on 3L O2, at rest 97, BP 120/60 Performed 5 x sit to stand,  2 min walk test Berg balance assessment.  With rechecks of O2 saturation and breathing with each activity   07/01/22 Re-eval O2 sats at 97% and HR 85 bpm at rest on 3L O2 via [place] from portable oxygen concentrator LE MMT   06/14/22 THERAPEUTIC ACTIVITIES: 6 Minute Walk Test:  6 Minute Walk- Baseline   BP (mmHg) 118/56    HR (bpm) 78    02 Sat (%RA) 96 %    Modified Borg Scale for Dyspnea 1- Very mild shortness of breath   Perceived Rate of Exertion (Borg) 6-      6 Minute walk- Post Test   BP (mmHg) 124/60    HR (  bpm) 94    02 Sat (%RA) 89 %    Modified Borg Scale for Dyspnea 5- Strong or hard breathing    Perceived Rate of Exertion (Borg) 13- Somewhat hard      6 minute walk test results    Aerobic Endurance Distance Walked 781    Endurance additional comments 1 seated rest break at 2:50 lasting ~40 sec   Training in use pt's personal respiratory muscle trainer  THERAPEUTIC EXERCISE: to improve flexibility, strength and mobility.  Verbal and tactile cues throughout for technique. BATCA leg press - B LE 25# 3 x 10 BATCA knee flexion - B LE 25# 3 x 10 BATCA knee extension - B LE 20# x 10, 15# x 5 (discontinued d/t c/o  increased knee pain)   06/11/22  Therapeutic Activity:  further assessment of gait and balance.  Bike L1 x 5 min  DGI 17/24 BERG 49/56   PATIENT EDUCATION:  Education details:  Need for clearance from MD to resume PT and need for reassessment of standardized tests with update of goals and POC if cleared to resume PT Person educated: Patient Education method: Explanation Education comprehension: verbalized understanding  HOME EXERCISE PROGRAM: Access Code: [email or web address] URL: [email or web address] Date: 06/11/2022 Prepared by: Harrie Foreman  Exercises - Standing Single Leg Stance with Counter Support  - 1 x daily - 7 x weekly - 1 sets - 3 reps - 30 sec  hold  ASSESSMENT:  CLINICAL IMPRESSION:  Johnathan [name] showed a good response to treatment. He was mildly fatigued after the interventions today. Close supervision to correct form with exercises and for monitoring of O2 sats with exercise. Will continue to progress as tolerated.   OBJECTIVE IMPAIRMENTS: Abnormal gait, decreased activity tolerance, decreased balance, decreased endurance, decreased mobility, difficulty walking, decreased strength, decreased safety awareness, and impaired perceived functional ability.   ACTIVITY LIMITATIONS: stairs, transfers, and locomotion level  PARTICIPATION LIMITATIONS: meal prep, cleaning, laundry, and community activity  PERSONAL FACTORS: Age and 3+ comorbidities: emphysema, diabetes, COPD, hypertension, former smoker  are also affecting patient's functional outcome.   REHAB POTENTIAL: Good  CLINICAL DECISION MAKING: Evolving/moderate complexity  EVALUATION COMPLEXITY: Moderate   GOALS: Goals reviewed with patient? Yes  SHORT TERM GOALS: Target date: 06/24/2022   Patient will be independent with initial HEP. Baseline:  Goal status: IN PROGRESS  07/01/22 - limited ability to complete training in initial HEP due to hospitalization 07/16/22: limited due to hospitalization  Patient  will complete further balance assessments including DGI and BERG.  Baseline:  Goal status: MET  06/11/22  LONG TERM GOALS: Target date: 09/10/2022   Patient will be independent with advanced/ongoing HEP to improve outcomes and carryover.  Baseline:  Goal status: IN PROGRESS   2.  Patient will be able to ambulate 600' with LRAD with good safety to access community.  Baseline: NT Goal status: IN PROGRESS 07/15/22:walked 2 min 4 sec, 270', pulse ox 88, recovered to 94 after seated rest 2 min  3.  Patient will be able to step up/down curb safely with LRAD for safety with community ambulation.  Baseline: NT Goal status: IN PROGRESS   4.  Patient will demonstrate improved functional LE strength as demonstrated by 5x STS < 15 sec . Baseline: 17 seconds Goal status: IN PROGRESS 07/15/22:17 sec, pulse ox 94 on 2 L o2 Baseline: 06/11/22- 17/24 Goal status: IN PROGRESS  6.  Patient will score >49/56 on Berg Balance test to demonstrate safety without assistive device Baseline: 49/56 Goal status: IN  PROGRESS 07/15/22: 42/56  7.  Patient will report 80% or better on ABC scale to demonstrate improved functional ability. Baseline: 67.5% Goal status: IN PROGRESS6/25/24: NT today due to time constraints   PLAN:  PT FREQUENCY: 1-2x/week  PT DURATION: 8 weeks  PLANNED INTERVENTIONS: Therapeutic exercises, Therapeutic activity, Neuromuscular re-education, Balance training, Gait training, Patient/Family education, Self Care, Joint mobilization, Stair training, Orthotic/Fit training, Moist heat, Manual therapy, and Re-evaluation  PLAN FOR NEXT SESSION: address endurance, strength training LE's high level balance. Progress endurance work on [employer]?   Darleene Cleaver, PTA  08/12/22 5:20 PM

## 2022-08-15 ENCOUNTER — Ambulatory Visit: Payer: Medicare HMO | Admitting: Physical Therapy

## 2022-08-15 ENCOUNTER — Encounter: Payer: Self-pay | Admitting: Physical Therapy

## 2022-08-15 DIAGNOSIS — R2681 Unsteadiness on feet: Secondary | ICD-10-CM

## 2022-08-15 DIAGNOSIS — R2689 Other abnormalities of gait and mobility: Secondary | ICD-10-CM

## 2022-08-15 NOTE — Therapy (Addendum)
OUTPATIENT PHYSICAL THERAPY TREATMENT / DISCHARGE SUMMARY   Patient Name: Johnathan [name] MRN: [id number] DOB:1932/02/01, 89 y.o., male Today's Date: 08/15/2022  END OF SESSION:  PT End of Session - 08/15/22 1315     Visit Number 9    Number of Visits 12    Date for PT Re-Evaluation 09/10/22    Authorization Type [employer]HC Dual Complete + Medicaid    Progress Note Due on Visit 10    PT Start Time 1315    PT Stop Time 1403    PT Time Calculation (min) 48 min    Activity Tolerance Patient tolerated treatment well;Patient limited by fatigue    Behavior During Therapy WFL for tasks assessed/performed                  Past Medical History:  Diagnosis Date   Anal fissure    COPD (chronic obstructive pulmonary disease) (HCC)    Diverticulosis    DM type 2 (diabetes mellitus, type 2) (HCC)    GERD (gastroesophageal reflux disease)    uses prilosec    Hypertension    Pneumonia 2018   Raynaud's disease    Shortness of breath dyspnea    WITH EXERTION   Sinusitis    Tremors of nervous system    Past Surgical History:  Procedure Laterality Date   COLONOSCOPY  03/27/2005   ESOPHAGOGASTRODUODENOSCOPY (EGD) WITH PROPOFOL N/A 02/08/2021   Procedure: ESOPHAGOGASTRODUODENOSCOPY (EGD) WITH PROPOFOL;  Surgeon: Rachael Fee, MD;  Location: [facility];  Service: Endoscopy;  Laterality: N/A;   HERNIA REPAIR     INGUINAL HERNIA REPAIR N/A 05/06/2019   Procedure: LAPAROSCOPIC RIGHT AND LEFT INGUINAL HERNIA(S) REPAIR;  Surgeon: Karie Soda, MD;  Location: [facility];  Service: General;  Laterality: N/A;   Patient Active Problem List   Diagnosis Date Noted   Severe sepsis (HCC) 06/18/2022   Hyperlipidemia associated with type 2 diabetes mellitus (HCC) 06/18/2022   Preop pulmonary/respiratory exam 04/09/2022   GERD (gastroesophageal reflux disease) 09/11/2021   Acute on chronic respiratory failure with hypoxia (HCC) 09/10/2021   Interstitial pulmonary disease (HCC)  03/19/2021   Hypertension associated with diabetes (HCC) 03/19/2021   Oral candidiasis 03/05/2021   Chronic respiratory failure with hypoxia (HCC) 03/05/2021   Trigger finger, right ring finger 02/13/2021   COPD with chronic bronchitis and emphysema (HCC) 04/26/2020   COVID-19 02/25/2020   Shingles 11/14/2019   Raynaud's syndrome 11/14/2019   Skin cancer 11/14/2019   Left inguinal hernia s/p lap repair w mesh 05/06/2019 05/06/2019   Recurrent right inguinal hernia s/p lap repair w mesh 05/06/2019 03/01/2019   Type 2 diabetes mellitus (HCC) 03/01/2019   Community acquired pneumonia of right lower lobe of lung 04/21/2014   COPD with acute exacerbation (HCC) 04/21/2014    PCP: Pearline Cables, MD  REFERRING PROVIDER: Pearline Cables, MD  REFERRING DIAG: R26.89 (ICD-10-CM) - Balance problem  THERAPY DIAG:  Unsteadiness on feet  Other abnormalities of gait and mobility  RATIONALE FOR EVALUATION AND TREATMENT: Rehabilitation  ONSET DATE: over last year  NEXT MD VISIT: 07/04/22   SUBJECTIVE:   SUBJECTIVE STATEMENT:  Pt reports his AC "went on the blitz" yesterday making his house very warm (~84-86) but it did not seem to significantly affect his breathing.  PAIN:  Are you having pain? No 0/10  PERTINENT HISTORY: From MD notes "History of emphysema, diabetes, COPD, hypertension, former smoker. Caspar had been doing excellently for age until he got COVID in February  2022; he subsequently developed pulmonary fibrosis and is still using oxygen"  PRECAUTIONS: Fall  WEIGHT BEARING RESTRICTIONS: No  FALLS:  Has patient fallen in last 6 months? No  LIVING ENVIRONMENT: Lives with:  lives with roommate Lives in: House/apartment Stairs: No Has following equipment at home:  portable oxygen  OCCUPATION: retired  PLOF: Independent and Leisure: goes to [employer]  PATIENT GOALS: be able to walk comfortable, can only walk short distances now.    OBJECTIVE:   DIAGNOSTIC FINDINGS:  06/27/21 DG R hip IMPRESSION: Degenerative changes in the right hip.  09/14/2017 Lumbar MRI IMPRESSION: 1. Diffuse grade 2 anterolisthesis at L5-S1 mild bilateral foraminal narrowing. 2. The most significant central canal stenosis is at L4-5 slight anterolisthesis results in uncovering of a broad-based disc protrusion. Mild subarticular and moderate bilateral foraminal stenosis is present. 3. Mild foraminal narrowing bilaterally at L2-3 secondary to a broad-based disc protrusion.  PATIENT SURVEYS:  ABC scale 67.5%  COGNITION: Overall cognitive status: Within functional limits for tasks assessed     SENSATION: WFL  MUSCLE LENGTH: Hamstrings: NT Quadriceps: NT  POSTURE: No Significant postural limitations  PALPATION: NT  LOWER EXTREMITY ROM: NT  LOWER EXTREMITY MMT:  5/5 bil except hip extension 4+/5 on eval  MMT Right 07/01/22 Left 07/01/22  Hip flexion 4+ 4+  Hip extension 4+ 4+  Hip abduction 4+ 4+  Hip adduction 4+ 4+  Hip internal rotation 5 5  Hip external rotation 4 4  Knee flexion 5 5  Knee extension 5 5  Ankle dorsiflexion 4+ 4+  Ankle plantarflexion    Ankle inversion    Ankle eversion     (Blank rows = not tested, tested in sitting on 07/01/22)  LOWER EXTREMITY SPECIAL TESTS:  NT  FUNCTIONAL TESTS:  5 times sit to stand: 17 sec without UE assist.  2 minute walk test: NT Dynamic Gait Index:  NT MCTSIB: Condition 1: Avg of 3 trials: 30 sec, Condition 2: Avg of 3 trials: 30 sec, Condition 3: Avg of 3 trials: 30 sec, Condition 4: Avg of 3 trials: 30 sec, and Total Score: 120/120 6 minute walk test:  [facility] PT Assessment - 06/14/22 0001     6 Minute Walk- Baseline   BP (mmHg) 118/56    HR (bpm) 78    02 Sat (%RA) 96 %    Modified Borg Scale for Dyspnea 1- Very mild shortness of breath   Perceived Rate of Exertion (Borg) 6-      6 Minute walk- Post Test   BP (mmHg) 124/60    HR (bpm) 94    02 Sat (%RA) 89 %    Modified Borg Scale for Dyspnea 5-  Strong or hard breathing    Perceived Rate of Exertion (Borg) 13- Somewhat hard      6 minute walk test results    Aerobic Endurance Distance Walked 781    Endurance additional comments 1 seated rest break at 2:50 lasting ~40 sec   GAIT: Distance walked: 102' Assistive device utilized: None Level of assistance: Complete Independence Comments: portable O2   TODAY'S TREATMENT:  DATE:   08/15/22 SpO2 = 92% at rest on 2L O2 via [place] THERAPEUTIC EXERCISE: to improve flexibility, strength and mobility.  Demonstration, verbal and tactile cues throughout for technique.  Rec Bike - L1 x 4 min (SpO2 = 94-95%), L2 x 4 min (SpO2 = 92%)  THERAPEUTIC ACTIVITIES: : 12.81 sec Gait speed: 2.56 ft/sec DGI: 21/24 Stairs: Level of Assistance: Modified independence Stair Negotiation Technique: Alternating Pattern  with Single Rail on Left Number of Stairs: 14  Height of Stairs: 7"  Comments: 2 hand support on rail for last 2 steps due to fatigue  NEUROMUSCULAR RE-EDUCATION: To improve posture, balance, proprioception, coordination, and reduce fall risk. Fwd toe clears to 6" step x 20 Lateral step taps to 6" step x 10 bil   08/12/22 THERAPEUTIC EXERCISE: to improve flexibility, strength and mobility.  Verbal and tactile cues throughout for technique. SpO2 96% on 2LPM at rest  Gait: 3 laps (237ft) SpO2- 89% recovered to 92% Nustep L5x73min STS x 10  Seated chop Yellow wt ball x 10; trunk rotation with yellow wt ball x 10  Knee extension 15# 2x10  Knee flexion 20# 2x10  Gait for 1 additional lap around the track   08/05/22 SpO2 91-92% on 2LPM at rest   NMR Tandem stance solid surface 3x30 seconds Narrow BOS on blue foam pad EO 3x30  Lateral step offs blue foam pad x10 B SpO2 88% on 2LPM after, recovered to 90% with PLB  Standing marches blue foam pad x20    TherEx Nustep BLEs L3 x8 minutes for endurance training (92-93% on 2LPM)   PATIENT EDUCATION:  Education details: progress with PT and ongoing PT POC Person educated: Patient Education method: Explanation Education comprehension: verbalized understanding  HOME EXERCISE PROGRAM: Access Code: [email or web address] URL: [email or web address] Date: 06/11/2022 Prepared by: Harrie Foreman  Exercises - Standing Single Leg Stance with Counter Support  - 1 x daily - 7 x weekly - 1 sets - 3 reps - 30 sec  hold   ASSESSMENT:  CLINICAL IMPRESSION:  Starlin demonstrates improving activity tolerance, now consistently able to ambulate from his car into the clinic with decreasing shortness of breath and SpO2 upon initiation of PT session consistently above 90%.  He continues to require intermittent rest breaks due to fatigue but can typically maintain his SpO2 >/= 90%.  Current gait speed at 2.56 ft/sec, just shy of community ambulation speed at 2.62 ft/sec. He is able to ambulate safely without an assistive device including curb negotiation and stair ascent/descent with single handrail assist.  DGI improved from 17/24 to 21/24, indicating a decreased risk for falls.  He does tend to demonstrate decreased foot clearance with obstacle negotiation, therefore worked on increased hip and knee flexion and SLS stability.  Will plan to complete remaining LTG assessment as part of 10th visit progress note next visit, but anticipate he should be on track to transition to a HEP by the end of his current plan of care.  OBJECTIVE IMPAIRMENTS: Abnormal gait, decreased activity tolerance, decreased balance, decreased endurance, decreased mobility, difficulty walking, decreased strength, decreased safety awareness, and impaired perceived functional ability.   ACTIVITY LIMITATIONS: stairs, transfers, and locomotion level  PARTICIPATION LIMITATIONS: meal prep, cleaning, laundry, and community activity  PERSONAL  FACTORS: Age and 3+ comorbidities: emphysema, diabetes, COPD, hypertension, former smoker  are also affecting patient's functional outcome.   REHAB POTENTIAL: Good  CLINICAL DECISION MAKING: Evolving/moderate complexity  EVALUATION COMPLEXITY: Moderate   GOALS: Goals reviewed with patient? Yes  SHORT  TERM GOALS: Target date: 06/24/2022   Patient will be independent with initial HEP. Baseline:  Goal status: IN PROGRESS  07/01/22 - limited ability to complete training in initial HEP due to hospitalization; 08/15/22 - patient unaware of HEP  Patient will complete further balance assessments including DGI and BERG.  Baseline:  Goal status: MET  06/11/22  LONG TERM GOALS: Target date: 09/10/2022   Patient will be independent with advanced/ongoing HEP to improve outcomes and carryover.  Baseline:  Goal status: IN PROGRESS  08/15/22 - patient unaware of HEP  2.  Patient will be able to ambulate 600' with LRAD with good safety to access community.  Baseline: NT Goal status: IN PROGRESS  07/15/22: walked 2 min 4 sec, 270', pulse ox 88, recovered to 94 after seated rest 2 min  3.  Patient will be able to step up/down curb safely with LRAD for safety with community ambulation.  Baseline: NT Goal status: MET  08/15/22   4.  Patient will demonstrate improved functional LE strength as demonstrated by 5x STS < 15 sec . Baseline: 17 seconds Goal status: IN PROGRESS  07/15/22: 17 sec, pulse ox 94 on 2 L o2  5. Patient will demonstrate at least 19/24 on DGI to improve gait stability and reduce risk for falls.  Baseline: 06/11/22 - 17/24 Goal status: MET  08/15/22: 21/24  6.  Patient will score >49/56 on Berg Balance test to demonstrate safety without assistive device Baseline: 49/56 Goal status: IN PROGRESS  07/15/22: 42/56  7.  Patient will report 80% or better on ABC scale to demonstrate improved functional ability. Baseline: 67.5% Goal status: IN PROGRESS  07/16/22: NT today due to time  constraints   PLAN:  PT FREQUENCY: 1-2x/week  PT DURATION: 8 weeks  PLANNED INTERVENTIONS: Therapeutic exercises, Therapeutic activity, Neuromuscular re-education, Balance training, Gait training, Patient/Family education, Self Care, Joint mobilization, Stair training, Orthotic/Fit training, Moist heat, Manual therapy, and Re-evaluation  PLAN FOR NEXT SESSION: 10th visit PN - reassess LTG's #2, 4, 6 and 7; review/expand upon HEP; progress endurance, strength training LE's, high level balance.    Marry Guan, PT  08/15/22 5:43 PM   PHYSICAL THERAPY DISCHARGE SUMMARY  Visits from Start of Care: 9  Current functional level related to goals / functional outcomes: Refer to above clinical impression and goal assessment for status as of last visit on 08/15/2022. Patient no showed for 3rd time on oral next scheduled visit on 08/22/2022, therefore will proceed with discharge from PT for this episode per NS/Cx policy.     Remaining deficits: As above. Unable to formally assess status at discharge due to failure to return to PT.   Education / Equipment: HEP   Patient agrees to discharge. Patient goals were partially met. Patient is being discharged due to not returning since the last visit.  Marry Guan, PT 11/04/22, 9:08 AM  [facility] [address]  [address] [place], [place], [address] Phone: [phone]   Fax:  [phone]

## 2022-08-20 ENCOUNTER — Other Ambulatory Visit: Payer: Self-pay

## 2022-08-20 MED ORDER — MELOXICAM 15 MG PO TABS: 15.0000 mg | ORAL_TABLET | Freq: Every morning | ORAL | 0 refills | Status: DC

## 2022-08-20 MED ORDER — PRAVASTATIN SODIUM 40 MG PO TABS: 20.0000 mg | ORAL_TABLET | Freq: Every day | ORAL | 0 refills | Status: DC

## 2022-08-20 MED ORDER — TERAZOSIN HCL 1 MG PO CAPS: 1.0000 mg | ORAL_CAPSULE | Freq: Two times a day (BID) | ORAL | 0 refills | Status: DC

## 2022-08-21 ENCOUNTER — Encounter (INDEPENDENT_AMBULATORY_CARE_PROVIDER_SITE_OTHER): Payer: Self-pay

## 2022-08-22 ENCOUNTER — Ambulatory Visit: Payer: Medicare HMO | Attending: Family Medicine

## 2022-08-22 DIAGNOSIS — L309 Dermatitis, unspecified: Secondary | ICD-10-CM | POA: Diagnosis not present

## 2022-08-22 DIAGNOSIS — I10 Essential (primary) hypertension: Secondary | ICD-10-CM | POA: Diagnosis not present

## 2022-08-22 DIAGNOSIS — Z87891 Personal history of nicotine dependence: Secondary | ICD-10-CM | POA: Diagnosis not present

## 2022-08-22 DIAGNOSIS — N529 Male erectile dysfunction, unspecified: Secondary | ICD-10-CM | POA: Diagnosis not present

## 2022-08-22 DIAGNOSIS — E785 Hyperlipidemia, unspecified: Secondary | ICD-10-CM | POA: Diagnosis not present

## 2022-08-22 DIAGNOSIS — G8929 Other chronic pain: Secondary | ICD-10-CM | POA: Diagnosis not present

## 2022-08-22 DIAGNOSIS — U071 COVID-19: Secondary | ICD-10-CM | POA: Diagnosis not present

## 2022-08-22 DIAGNOSIS — B49 Unspecified mycosis: Secondary | ICD-10-CM | POA: Diagnosis not present

## 2022-08-22 DIAGNOSIS — J449 Chronic obstructive pulmonary disease, unspecified: Secondary | ICD-10-CM | POA: Diagnosis not present

## 2022-08-22 DIAGNOSIS — M2550 Pain in unspecified joint: Secondary | ICD-10-CM | POA: Diagnosis not present

## 2022-08-22 DIAGNOSIS — K219 Gastro-esophageal reflux disease without esophagitis: Secondary | ICD-10-CM | POA: Diagnosis not present

## 2022-08-22 DIAGNOSIS — J309 Allergic rhinitis, unspecified: Secondary | ICD-10-CM | POA: Diagnosis not present

## 2022-08-22 DIAGNOSIS — Z8249 Family history of ischemic heart disease and other diseases of the circulatory system: Secondary | ICD-10-CM | POA: Diagnosis not present

## 2022-08-26 ENCOUNTER — Telehealth: Payer: Self-pay | Admitting: Family Medicine

## 2022-08-26 NOTE — Telephone Encounter (Signed)
Patient said he is having issues getting his medications filled with [facility] and he would like some help because he is getting no where. Pt said he will be at the gym and will have his phone but please leave a message if he does not answer.   Issues are with the following medications (he's down to his last 8 days):   pravastatin (PRAVACHOL) 40 MG tablet [[id number]] 2. terazosin (HYTRIN) 1 MG capsule 3. meloxicam (MOBIC) 15 MG tablet 4. Atorvastatin 5. Clonidine 6. Escitalopram

## 2022-08-27 NOTE — Telephone Encounter (Signed)
I found a few issues with this list:   1. pravastatin (PRAVACHOL) 40 MG table 2. terazosin (HYTRIN) 1 MG capsule 3. meloxicam (MOBIC) 15 MG tablet 4. Atorvastatin-- Does not need this medication. 5. Clonidine- Never been prescribed this. 6. Escitalopram - Never been prescribed this.

## 2022-08-28 ENCOUNTER — Other Ambulatory Visit: Payer: Self-pay

## 2022-08-28 ENCOUNTER — Ambulatory Visit: Payer: Medicare HMO

## 2022-08-28 MED ORDER — TERAZOSIN HCL 1 MG PO CAPS: 1.0000 mg | ORAL_CAPSULE | Freq: Two times a day (BID) | ORAL | 0 refills | Status: DC

## 2022-08-28 MED ORDER — PRAVASTATIN SODIUM 40 MG PO TABS: 20.0000 mg | ORAL_TABLET | Freq: Every day | ORAL | 0 refills | Status: DC

## 2022-08-28 MED ORDER — MELOXICAM 15 MG PO TABS: 15.0000 mg | ORAL_TABLET | Freq: Every morning | ORAL | 0 refills | Status: DC

## 2022-08-28 NOTE — Telephone Encounter (Signed)
I called the pt to verify what meds he needed, ans this was in fact the meds that he is trying to obtain.   I let him know that he is supposed to be on Pravastatin and not Atorvastatin, and it doesn't look like Dr Patsy Lager has ever prescribed Clonidine or Escitalopram for him. He will bring ALL of his meds to his up coming appointment.    However, I have sent in the Pravastatin, Meloxicam, and Terazosin for him.

## 2022-08-31 ENCOUNTER — Other Ambulatory Visit: Payer: Self-pay | Admitting: Family Medicine

## 2022-09-03 ENCOUNTER — Encounter: Payer: Medicare HMO | Admitting: Physical Therapy

## 2022-09-04 ENCOUNTER — Ambulatory Visit: Payer: 59 | Admitting: Family Medicine

## 2022-09-04 DIAGNOSIS — L718 Other rosacea: Secondary | ICD-10-CM | POA: Diagnosis not present

## 2022-09-04 DIAGNOSIS — D225 Melanocytic nevi of trunk: Secondary | ICD-10-CM | POA: Diagnosis not present

## 2022-09-04 DIAGNOSIS — L570 Actinic keratosis: Secondary | ICD-10-CM | POA: Diagnosis not present

## 2022-09-04 DIAGNOSIS — L821 Other seborrheic keratosis: Secondary | ICD-10-CM | POA: Diagnosis not present

## 2022-09-04 DIAGNOSIS — Z85828 Personal history of other malignant neoplasm of skin: Secondary | ICD-10-CM | POA: Diagnosis not present

## 2022-09-04 DIAGNOSIS — Z85820 Personal history of malignant melanoma of skin: Secondary | ICD-10-CM | POA: Diagnosis not present

## 2022-09-05 ENCOUNTER — Encounter (INDEPENDENT_AMBULATORY_CARE_PROVIDER_SITE_OTHER): Payer: Self-pay

## 2022-09-06 NOTE — Progress Notes (Unsigned)
[facility] [address] [place], [place] [address] [phone]  Date:  09/11/2022   Name:  Johnathan [name]   DOB:  April 16, 1932   MRN:  [id number]  PCP:  Johnathan Cables, MD    Chief Complaint: No chief complaint on file.   History of Present Illness:  Johnathan [name] is a 89 y.o. very pleasant male patient who presents with the following:  Patient seen today for follow-up Most recent visit with myself was in April after he had recently been admitted for about a week with COPD exacerbation, pneumonia, sepsis  History of emphysema, diabetes, COPD, hypertension, former smoker. Johnathan [name] had been doing excellently for age until he got COVID in February 2022; he subsequently developed pulmonary fibrosis and is still using oxygen   He has been doing some physical therapy since our last visit  Lab Results  Component Value Date   HGBA1C 6.6 (H) 05/06/2022     Patient Active Problem List   Diagnosis Date Noted   Severe sepsis (HCC) 06/18/2022   Hyperlipidemia associated with type 2 diabetes mellitus (HCC) 06/18/2022   Preop pulmonary/respiratory exam 04/09/2022   GERD (gastroesophageal reflux disease) 09/11/2021   Acute on chronic respiratory failure with hypoxia (HCC) 09/10/2021   Interstitial pulmonary disease (HCC) 03/19/2021   Hypertension associated with diabetes (HCC) 03/19/2021   Oral candidiasis 03/05/2021   Chronic respiratory failure with hypoxia (HCC) 03/05/2021   Trigger finger, right ring finger 02/13/2021   COPD with chronic bronchitis and emphysema (HCC) 04/26/2020   COVID-19 02/25/2020   Shingles 11/14/2019   Raynaud's syndrome 11/14/2019   Skin cancer 11/14/2019   Left inguinal hernia s/p lap repair w mesh 05/06/2019 05/06/2019   Recurrent right inguinal hernia s/p lap repair w mesh 05/06/2019 03/01/2019   Type 2 diabetes mellitus (HCC) 03/01/2019   Community acquired pneumonia of right lower lobe of  lung 04/21/2014   COPD with acute exacerbation (HCC) 04/21/2014    Past Medical History:  Diagnosis Date   Anal fissure    COPD (chronic obstructive pulmonary disease) (HCC)    Diverticulosis    DM type 2 (diabetes mellitus, type 2) (HCC)    GERD (gastroesophageal reflux disease)    uses prilosec    Hypertension    Pneumonia 2018   Raynaud's disease    Shortness of breath dyspnea    WITH EXERTION   Sinusitis    Tremors of nervous system     Past Surgical History:  Procedure Laterality Date   COLONOSCOPY  03/27/2005   ESOPHAGOGASTRODUODENOSCOPY (EGD) WITH PROPOFOL N/A 02/08/2021   Procedure: ESOPHAGOGASTRODUODENOSCOPY (EGD) WITH PROPOFOL;  Surgeon: Rachael Fee, MD;  Location: [facility];  Service: Endoscopy;  Laterality: N/A;   HERNIA REPAIR     INGUINAL HERNIA REPAIR N/A 05/06/2019   Procedure: LAPAROSCOPIC RIGHT AND LEFT INGUINAL HERNIA(S) REPAIR;  Surgeon: Karie Soda, MD;  Location: [facility];  Service: General;  Laterality: N/A;    Social History   Tobacco Use   Smoking status: Former    Current packs/day: 0.00    Average packs/day: 1 pack/day for 45.0 years (45.0 ttl pk-yrs)    Types: Cigarettes    Start date: 01/21/1949    Quit date: 01/21/1994    Years since quitting: 28.6   Smokeless tobacco: Never  Substance Use Topics   Alcohol use: Yes    Comment: a drink daily with dinner   Drug use: No  Family History  Problem Relation Age of Onset   Dementia Brother    Diabetes Brother    Heart attack Father    Dementia Father     No Known Allergies  Medication list has been reviewed and updated.  Current Outpatient Medications on File Prior to Visit  Medication Sig Dispense Refill   albuterol (VENTOLIN HFA) 108 (90 Base) MCG/ACT inhaler Inhale 2 puffs into the lungs every 6 (six) hours as needed for wheezing or shortness of breath. 18 g 2   amLODipine (NORVASC) 5 MG tablet Take 1 tablet (5 mg total) by mouth daily. 90 tablet 3    aspirin EC 81 MG tablet Take 81 mg by mouth every morning.     B Complex-C (SUPER B COMPLEX PO) Take 1 tablet by mouth at bedtime.     Flaxseed, Linseed, (FLAXSEED OIL) 1200 MG CAPS Take 1,200 mg by mouth at bedtime.     Fluticasone-Umeclidin-Vilant (TRELEGY ELLIPTA) 100-62.5-25 MCG/ACT AEPB Inhale 1 puff into the lungs at bedtime.     furosemide (LASIX) 20 MG tablet Take 1 tablet (20 mg total) by mouth daily as needed for edema. (Patient not taking: Reported on 06/18/2022) 30 tablet 0   Ginkgo Biloba EXTR Take 1 capsule by mouth 2 (two) times daily.     guaiFENesin (MUCINEX) 600 MG 12 hr tablet Take 2 tablets (1,200 mg total) by mouth 2 (two) times daily. (Patient taking differently: Take 600 mg by mouth 3 (three) times daily.) 60 tablet 0   ketoconazole (NIZORAL) 2 % cream Apply 1 Application topically daily. Use as needed for fungal infection (Patient not taking: Reported on 06/18/2022) 15 g 1   ketoconazole (NIZORAL) 2 % shampoo Apply 1 Application topically 2 (two) times a week.     loratadine (CLARITIN) 10 MG tablet TAKE 1 TABLET EVERY DAY (Patient taking differently: Take 10 mg by mouth every morning.) 90 tablet 1   meloxicam (MOBIC) 15 MG tablet Take 1 tablet (15 mg total) by mouth in the morning. 90 tablet 0   metFORMIN (GLUCOPHAGE) 500 MG tablet Take 0.5 tablets (250 mg total) by mouth daily. 45 tablet 3   Misc Natural Products (HIMALAYAN GOJI PO) Take 6 Doses by mouth daily. Goji berries     Multiple Vitamin (MULTIVITAMIN WITH MINERALS) TABS tablet Take 1 tablet by mouth daily with breakfast.     omeprazole (PRILOSEC) 20 MG capsule TAKE 1 CAPSULE BY MOUTH TWICE  DAILY 160 capsule 3   OXYGEN Inhale 2 L/min into the lungs continuous.     pravastatin (PRAVACHOL) 40 MG tablet Take 0.5 tablets (20 mg total) by mouth daily. 45 tablet 0   primidone (MYSOLINE) 50 MG tablet TAKE 1 TABLET BY MOUTH TWICE  DAILY 200 tablet 2   SYSTANE COMPLETE PF 0.6 % SOLN Place 1 drop into the right eye in the  morning, at noon, and at bedtime.     tadalafil (CIALIS) 20 MG tablet TAKE 1/2 TO 1 TAB po 2 HRS PRIOR TO SEXUAL ACTIVITY (Patient taking differently: Take 10-20 mg by mouth See admin instructions. Take 10-20 mg by mouth two hours prior to sexual activity) 15 tablet 5   terazosin (HYTRIN) 1 MG capsule Take 1 capsule (1 mg total) by mouth 2 (two) times daily. 180 capsule 0   traMADol (ULTRAM) 50 MG tablet Take 1 tablet (50 mg total) by mouth every 12 (twelve) hours as needed (for back pain). 30 tablet 0   triamcinolone cream (KENALOG) 0.1 % Apply 1  Application topically 2 (two) times daily as needed (rash/itching).     VITAMIN E PO Take 1 capsule by mouth daily with breakfast.     No current facility-administered medications on file prior to visit.    Review of Systems:  As per HPI- otherwise negative.   Physical Examination: There were no vitals filed for this visit. There were no vitals filed for this visit. There is no height or weight on file to calculate BMI. Ideal Body Weight:    GEN: no acute distress. HEENT: Atraumatic, Normocephalic.  Ears and Nose: No external deformity. CV: RRR, No M/G/R. No JVD. No thrill. No extra heart sounds. PULM: CTA B, no wheezes, crackles, rhonchi. No retractions. No resp. distress. No accessory muscle use. ABD: S, NT, ND, +BS. No rebound. No HSM. EXTR: No c/c/e PSYCH: Normally interactive. Conversant.    Assessment and Plan: ***  Signed Abbe Amsterdam, MD

## 2022-09-08 ENCOUNTER — Other Ambulatory Visit: Payer: Self-pay | Admitting: Pulmonary Disease

## 2022-09-09 ENCOUNTER — Telehealth: Payer: Self-pay | Admitting: Family Medicine

## 2022-09-09 NOTE — Telephone Encounter (Signed)
Pt called & stated that he is having pain in his left shoulder & requested to speak someone about his medications to know of he should be taking something else. Please advise pt.

## 2022-09-10 ENCOUNTER — Encounter: Payer: Self-pay | Admitting: Family Medicine

## 2022-09-10 NOTE — Telephone Encounter (Signed)
I also called pt but he did not answer Will send mychart message

## 2022-09-10 NOTE — Telephone Encounter (Signed)
Pt is scheduled tomorrow 09/11/22. I called and lvm for pt to call back so that I can be sure he didn't need to go to the ER.

## 2022-09-11 ENCOUNTER — Ambulatory Visit (INDEPENDENT_AMBULATORY_CARE_PROVIDER_SITE_OTHER): Payer: Medicare HMO | Admitting: Family Medicine

## 2022-09-11 VITALS — BP 124/80 | HR 72 | Temp 97.7°F | Resp 18 | Ht 69.0 in | Wt 148.8 lb

## 2022-09-11 DIAGNOSIS — S46812S Strain of other muscles, fascia and tendons at shoulder and upper arm level, left arm, sequela: Secondary | ICD-10-CM | POA: Diagnosis not present

## 2022-09-11 DIAGNOSIS — I10 Essential (primary) hypertension: Secondary | ICD-10-CM | POA: Diagnosis not present

## 2022-09-11 DIAGNOSIS — R1319 Other dysphagia: Secondary | ICD-10-CM | POA: Diagnosis not present

## 2022-09-11 DIAGNOSIS — Z9981 Dependence on supplemental oxygen: Secondary | ICD-10-CM

## 2022-09-11 DIAGNOSIS — J9611 Chronic respiratory failure with hypoxia: Secondary | ICD-10-CM | POA: Diagnosis not present

## 2022-09-11 DIAGNOSIS — L299 Pruritus, unspecified: Secondary | ICD-10-CM

## 2022-09-11 DIAGNOSIS — N529 Male erectile dysfunction, unspecified: Secondary | ICD-10-CM | POA: Diagnosis not present

## 2022-09-11 MED ORDER — OMEPRAZOLE 20 MG PO CPDR
20.0000 mg | DELAYED_RELEASE_CAPSULE | Freq: Two times a day (BID) | ORAL | 3 refills | Status: DC
Start: 2022-09-11 — End: 2022-12-17

## 2022-09-11 MED ORDER — KETOCONAZOLE 2 % EX SHAM
1.0000 | MEDICATED_SHAMPOO | CUTANEOUS | 99 refills | Status: AC
Start: 2022-09-12 — End: ?

## 2022-09-11 MED ORDER — TADALAFIL 20 MG PO TABS
ORAL_TABLET | ORAL | 5 refills | Status: DC
Start: 2022-09-11 — End: 2022-12-27

## 2022-09-11 NOTE — Patient Instructions (Addendum)
It was good to see you today- I think you had a muscle strain in your upper back/ trapezius muscle Let me know if this comes back again   I did your refills for you today Recommend flu shot and covid booster this fall  Assuming all is well please see me in about 4 months

## 2022-09-22 DIAGNOSIS — U071 COVID-19: Secondary | ICD-10-CM | POA: Diagnosis not present

## 2022-10-02 ENCOUNTER — Ambulatory Visit (INDEPENDENT_AMBULATORY_CARE_PROVIDER_SITE_OTHER): Payer: Medicare HMO | Admitting: Physician Assistant

## 2022-10-02 ENCOUNTER — Encounter: Payer: Self-pay | Admitting: Physician Assistant

## 2022-10-02 VITALS — BP 138/82 | HR 100 | Temp 98.9°F | Resp 20 | Wt 149.8 lb

## 2022-10-02 DIAGNOSIS — R051 Acute cough: Secondary | ICD-10-CM | POA: Diagnosis not present

## 2022-10-02 DIAGNOSIS — U071 COVID-19: Secondary | ICD-10-CM

## 2022-10-02 LAB — POC COVID19 BINAXNOW: SARS Coronavirus 2 Ag: POSITIVE — AB

## 2022-10-02 MED ORDER — MOLNUPIRAVIR EUA 200MG CAPSULE
4.0000 | ORAL_CAPSULE | Freq: Two times a day (BID) | ORAL | 0 refills | Status: AC
Start: 2022-10-02 — End: 2022-10-07

## 2022-10-02 NOTE — Progress Notes (Signed)
Established patient visit   Patient: Johnathan [name]   DOB: [birth year]   [name]  MRN: [id number] Visit Date: 10/02/2022  Today's healthcare provider: Alfredia Ferguson, PA-C   Chief Complaint  Patient presents with   Sore Throat    Here for sore throat, hoarse, and sneezing symptoms started yesterday    Subjective    Sore Throat  Associated symptoms include congestion. Pertinent negatives include no coughing, headaches or shortness of breath.   Pt reports since yesterday, hoarse, sore throat, sneezing. He reports some upper back pain as well. Denies increased SOB, chest pain, new cough, wheezing. He is currently on 2 L O2 24/7.   Medications: Outpatient Medications Prior to Visit  Medication Sig   albuterol (VENTOLIN HFA) 108 (90 Base) MCG/ACT inhaler Inhale 2 puffs into the lungs every 6 (six) hours as needed for wheezing or shortness of breath.   amLODipine (NORVASC) 5 MG tablet Take 1 tablet (5 mg total) by mouth daily.   aspirin EC 81 MG tablet Take 81 mg by mouth every morning.   B Complex-C (SUPER B COMPLEX PO) Take 1 tablet by mouth at bedtime.   Flaxseed, Linseed, (FLAXSEED OIL) 1200 MG CAPS Take 1,200 mg by mouth at bedtime.   Fluticasone-Umeclidin-Vilant (TRELEGY ELLIPTA) 100-62.5-25 MCG/ACT AEPB Inhale 1 puff into the lungs at bedtime.   furosemide (LASIX) 20 MG tablet Take 1 tablet (20 mg total) by mouth daily as needed for edema.   Ginkgo Biloba EXTR Take 1 capsule by mouth 2 (two) times daily.   guaiFENesin (MUCINEX) 600 MG 12 hr tablet Take 2 tablets (1,200 mg total) by mouth 2 (two) times daily. (Patient taking differently: Take 600 mg by mouth 3 (three) times daily.)   ketoconazole (NIZORAL) 2 % cream Apply 1 Application topically daily. Use as needed for fungal infection   ketoconazole (NIZORAL) 2 % shampoo Apply 1 Application topically 2 (two) times a week. Use as needed for itchy scalp   loratadine (CLARITIN) 10 MG tablet TAKE 1 TABLET EVERY DAY (Patient  taking differently: Take 10 mg by mouth every morning.)   meloxicam (MOBIC) 15 MG tablet Take 1 tablet (15 mg total) by mouth in the morning.   metFORMIN (GLUCOPHAGE) 500 MG tablet Take 0.5 tablets (250 mg total) by mouth daily.   Misc Natural Products (HIMALAYAN GOJI PO) Take 6 Doses by mouth daily. Goji berries   Multiple Vitamin (MULTIVITAMIN WITH MINERALS) TABS tablet Take 1 tablet by mouth daily with breakfast.   omeprazole (PRILOSEC) 20 MG capsule Take 1 capsule (20 mg total) by mouth 2 (two) times daily.   OXYGEN Inhale 2 L/min into the lungs continuous.   pravastatin (PRAVACHOL) 40 MG tablet Take 0.5 tablets (20 mg total) by mouth daily.   primidone (MYSOLINE) 50 MG tablet TAKE 1 TABLET BY MOUTH TWICE  DAILY   SYSTANE COMPLETE PF 0.6 % SOLN Place 1 drop into the right eye in the morning, at noon, and at bedtime.   tadalafil (CIALIS) 20 MG tablet TAKE 1/2 TO 1 TAB po 2 HRS PRIOR TO SEXUAL ACTIVITY   terazosin (HYTRIN) 1 MG capsule Take 1 capsule (1 mg total) by mouth 2 (two) times daily.   traMADol (ULTRAM) 50 MG tablet Take 1 tablet (50 mg total) by mouth every 12 (twelve) hours as needed (for back pain).   triamcinolone cream (KENALOG) 0.1 % Apply 1 Application topically 2 (two) times daily as needed (rash/itching).   VITAMIN E PO Take  1 capsule by mouth daily with breakfast.   No facility-administered medications prior to visit.    Review of Systems  Constitutional:  Positive for fatigue. Negative for fever.  HENT:  Positive for congestion and sneezing.   Respiratory:  Negative for cough and shortness of breath.   Cardiovascular:  Negative for chest pain, palpitations and leg swelling.  Neurological:  Negative for dizziness and headaches.      Objective    BP 138/82 (BP Location: Left Arm, Patient Position: Sitting, Cuff Size: Small)   Pulse 100   Temp 98.9 F (37.2 C)   Resp 20   Wt 149 lb 12.8 oz (67.9 kg)   SpO2 96%   BMI 22.12 kg/m   Physical Exam Constitutional:       General: He is awake.     Appearance: He is well-developed.  HENT:     Head: Normocephalic.  Eyes:     Conjunctiva/sclera: Conjunctivae normal.  Cardiovascular:     Rate and Rhythm: Normal rate and regular rhythm.     Heart sounds: Normal heart sounds.  Pulmonary:     Effort: Pulmonary effort is normal.     Breath sounds: Normal breath sounds. No wheezing, rhonchi or rales.  Musculoskeletal:     Comments: Upper back pain is reproducible. Midline thoracic tenderness.  Skin:    General: Skin is warm.  Neurological:     Mental Status: He is alert and oriented to person, place, and time.  Psychiatric:        Attention and Perception: Attention normal.        Mood and Affect: Mood normal.        Speech: Speech normal.        Behavior: Behavior is cooperative.      Results for orders placed or performed in visit on 10/02/22  POC COVID-19  Result Value Ref Range   SARS Coronavirus 2 Ag Positive (A) Negative    Assessment & Plan     1. Acute cough Covid 19 + - POC COVID-19  2. COVID-19 Given interactions w/ paxlovid, rx molnupiravir. Pt is concerned given his history of COVID pneumonia. Reassured vitals are stable, he is not experiencing worsened SOB. Since upper back pain is reproducible, likely musculoskeletal from coughing. Cont mucinex and tylenol otc, hydrate. Pt already monitors O2 at home. Advised if < 93% to temporarily increase to 3L.   Gave strict ED precautions.   - molnupiravir EUA (LAGEVRIO) 200 mg CAPS capsule; Take 4 capsules (800 mg total) by mouth 2 (two) times daily for 5 days.  Dispense: 40 capsule; Refill: 0    Return if symptoms worsen or fail to improve.      I, Alfredia Ferguson, PA-C have reviewed all documentation for this visit. The documentation on  10/02/22   for the exam, diagnosis, procedures, and orders are all accurate and complete.    Alfredia Ferguson, PA-C  [facility] [phone]  (phone) [phone] (fax)  [facility]

## 2022-10-10 ENCOUNTER — Encounter: Payer: Self-pay | Admitting: Pulmonary Disease

## 2022-10-10 ENCOUNTER — Ambulatory Visit: Payer: Medicare HMO | Admitting: Pulmonary Disease

## 2022-10-10 ENCOUNTER — Ambulatory Visit: Payer: Medicare HMO

## 2022-10-10 ENCOUNTER — Ambulatory Visit: Payer: 59 | Admitting: Family Medicine

## 2022-10-10 VITALS — BP 102/50 | HR 85 | Ht 69.0 in | Wt 147.8 lb

## 2022-10-10 DIAGNOSIS — J4489 Other specified chronic obstructive pulmonary disease: Secondary | ICD-10-CM

## 2022-10-10 DIAGNOSIS — J439 Emphysema, unspecified: Secondary | ICD-10-CM | POA: Diagnosis not present

## 2022-10-10 DIAGNOSIS — R918 Other nonspecific abnormal finding of lung field: Secondary | ICD-10-CM | POA: Diagnosis not present

## 2022-10-10 DIAGNOSIS — J40 Bronchitis, not specified as acute or chronic: Secondary | ICD-10-CM | POA: Diagnosis not present

## 2022-10-10 DIAGNOSIS — J189 Pneumonia, unspecified organism: Secondary | ICD-10-CM | POA: Diagnosis not present

## 2022-10-10 NOTE — Patient Instructions (Signed)
VISIT SUMMARY:  During your visit, we discussed your recent health issues, including your severe COPD exacerbation, pneumonia, and sepsis in May, as well as your recent COVID-19 infection. You are currently on 2 liters of oxygen and using a Trelegy inhaler at night. You reported feeling fatigued and having difficulty with physical exertion, particularly when walking up a slight hill.  YOUR PLAN:  -COPD WITH RECENT EXACERBATION: COPD, or Chronic Obstructive Pulmonary Disease, is a lung disease that makes it hard to breathe and can worsen over time. Your recent exacerbation was severe, requiring hospitalization and oxygen supplementation. We will order a chest x-ray to check for any new lung changes after your COVID-19 infection. You should continue using your Trelegy inhaler nightly and try to gradually increase your physical activity as you can tolerate it. We will follow up on this in 6 months.  -COVID-19: You recently had a COVID-19 infection, which was treated with molnupiravir. You are currently recovering, but still experiencing fatigue. Continue with supportive care and try to gradually increase your physical activity.  -UNUSED MEDICATION (SYMBICORT): You have unopened Symbicort inhalers at home. Symbicort is another type of inhaler that can help manage COPD symptoms. Keep this as a backup inhaler in case your Trelegy is unavailable.  INSTRUCTIONS:  Please get a chest x-ray as ordered to check for any new lung changes. Continue using your Trelegy inhaler nightly and try to gradually increase your physical activity as you can tolerate it. Keep your Symbicort inhalers as a backup in case your Trelegy is unavailable. We will follow up on your condition in 6 months.

## 2022-10-10 NOTE — Progress Notes (Signed)
Johnathan [name]    [id number]    [birth year]  Primary Care Physician:Copland, Gwenlyn Found, MD  Referring Physician: Pearline Cables, MD [address] [place],  [place] [address]  Chief complaint: Follow-up for asthma, COPD, post COVID-66  HPI: 89 year old ex-smoker with history of emphysema [no PFTs on record], Raynaud's syndrome Maintained on Symbicort for many years  Hospitalized for COVID-19 in early February 2022 in spite of getting the booster vaccine. He was treated with IV remdesivir, steroids, discharged on supplemental oxygen Post discharge he continues to have persistent dyspnea on exertion, hypoxia whenever he stops oxygen.  Follow-up chest x-ray shows persistent lung infiltrate and he has been referred to pulmonary for further evaluation Advair changed to Trelegy in 2021.  He feels that this is working better for him  Pets: No pets Occupation: Worked miscellaneous jobs including farm, Research officer, political party, Airline pilot Exposures: No mold, hot tub, Financial controller.  No feather pillows or comforter. Smoking history: 45-pack-year smoker.  Quit in the 1990s Travel history: No significant travel history Relevant family history: Father had COPD.  He was a smoker.  Interim history: Discussed the use of AI scribe software for clinical note transcription with the patient, who gave verbal consent to proceed.  The patient, with a history of COPD, was last seen by a nurse practitioner in March and was doing well at that time. Since then, he experienced a severe COPD exacerbation, pneumonia, and sepsis in May, requiring hospitalization and oxygen supplementation up to 8 liters. He was treated with antibiotics and steroids, with slow improvement, and was discharged home on 2 liters of oxygen. He reports no significant changes since discharge, maintaining oxygen at 2 liters 24/7, and disconnecting to move around the house without significant impact on his oxygen levels.  Recently, he was  diagnosed with COVID-19 and completed a course of molnupiravir. He reports feeling fatigued and having difficulty getting out of bed, but no increased shortness of breath, cough, or mucus production. He notes significant exertional dyspnea and fatigue, particularly when walking up a slight hill. He is currently on Trelegy inhaler at night.   Outpatient Encounter Medications as of 10/10/2022  Medication Sig   albuterol (VENTOLIN HFA) 108 (90 Base) MCG/ACT inhaler Inhale 2 puffs into the lungs every 6 (six) hours as needed for wheezing or shortness of breath.   amLODipine (NORVASC) 5 MG tablet Take 1 tablet (5 mg total) by mouth daily.   aspirin EC 81 MG tablet Take 81 mg by mouth every morning.   B Complex-C (SUPER B COMPLEX PO) Take 1 tablet by mouth at bedtime.   Flaxseed, Linseed, (FLAXSEED OIL) 1200 MG CAPS Take 1,200 mg by mouth at bedtime.   furosemide (LASIX) 20 MG tablet Take 1 tablet (20 mg total) by mouth daily as needed for edema.   Ginkgo Biloba EXTR Take 1 capsule by mouth 2 (two) times daily.   guaiFENesin (MUCINEX) 600 MG 12 hr tablet Take 2 tablets (1,200 mg total) by mouth 2 (two) times daily. (Patient taking differently: Take 600 mg by mouth 3 (three) times daily.)   ketoconazole (NIZORAL) 2 % cream Apply 1 Application topically daily. Use as needed for fungal infection   ketoconazole (NIZORAL) 2 % shampoo Apply 1 Application topically 2 (two) times a week. Use as needed for itchy scalp   loratadine (CLARITIN) 10 MG tablet TAKE 1 TABLET EVERY DAY (Patient taking differently: Take 10 mg by mouth every morning.)  meloxicam (MOBIC) 15 MG tablet Take 1 tablet (15 mg total) by mouth in the morning.   metFORMIN (GLUCOPHAGE) 500 MG tablet Take 0.5 tablets (250 mg total) by mouth daily.   Misc Natural Products (HIMALAYAN GOJI PO) Take 6 Doses by mouth daily. Goji berries   Multiple Vitamin (MULTIVITAMIN WITH MINERALS) TABS tablet Take 1 tablet by mouth daily with breakfast.   omeprazole  (PRILOSEC) 20 MG capsule Take 1 capsule (20 mg total) by mouth 2 (two) times daily.   OXYGEN Inhale 2 L/min into the lungs continuous.   pravastatin (PRAVACHOL) 40 MG tablet Take 0.5 tablets (20 mg total) by mouth daily.   primidone (MYSOLINE) 50 MG tablet TAKE 1 TABLET BY MOUTH TWICE  DAILY   SYSTANE COMPLETE PF 0.6 % SOLN Place 1 drop into the right eye in the morning, at noon, and at bedtime.   tadalafil (CIALIS) 20 MG tablet TAKE 1/2 TO 1 TAB po 2 HRS PRIOR TO SEXUAL ACTIVITY   terazosin (HYTRIN) 1 MG capsule Take 1 capsule (1 mg total) by mouth 2 (two) times daily.   traMADol (ULTRAM) 50 MG tablet Take 1 tablet (50 mg total) by mouth every 12 (twelve) hours as needed (for back pain).   TRELEGY ELLIPTA 100-62.5-25 MCG/ACT AEPB USE 1 INHALATION BY MOUTH INTO  THE LUNGS DAILY AT THE SAME TIME EACH DAY   triamcinolone cream (KENALOG) 0.1 % Apply 1 Application topically 2 (two) times daily as needed (rash/itching).   VITAMIN E PO Take 1 capsule by mouth daily with breakfast.   No facility-administered encounter medications on file as of 10/10/2022.   Physical Exam: Blood pressure (!) 102/50, pulse 85, height 5\' 9"  (1.753 m), weight 147 lb 12.8 oz (67 kg), SpO2 96%. Gen:      No acute distress HEENT:  EOMI, sclera anicteric Neck:     No masses; no thyromegaly Lungs:    Clear to auscultation bilaterally; normal respiratory effort CV:         Regular rate and rhythm; no murmurs Abd:      + bowel sounds; soft, non-tender; no palpable masses, no distension Ext:    No edema; adequate peripheral perfusion Skin:      Warm and dry; no rash Neuro: alert and oriented x 3 Psych: normal mood and affect   Data Reviewed: Imaging: Chest x-ray 02/20/2020-hyperinflation, no acute process Chest x-ray 02/25/2020-emphysema, mild left base opacity Chest x-ray 03/30/2020-bilateral infiltrates. CT high-resolution 04/28/2020- emphysema, peripheral sublenticular groundglass densities, calcified granuloma.   Indeterminate pattern CTA 09/11/2021-no PE, reticular opacities in the mid lung CT chest 10/29/2021-emphysema, improvement in multifocal pulmonary infiltrates Chest x-ray 06/18/2022-right basilar opacity. I have reviewed the images personally.  PFTs: 05/26/2020 FVC 2.41 [6%], FEV1 1.23 [48%], F/F 51, TLC 5.03 [73%], DLCO 7.92 [34%] Severe obstruction, severe diffusion defect, mild restriction  Labs: CBC 02/29/2020-WBC 5.3, eos 0% CBC 05/30/2020-WBC 5, eos 7.6%, absolute eosinophil count 380 IgE 04/26/2020-858  ANA, CCP, rheumatoid factor 04/26/2020- Negative  Assessment:  COPD with recent exacerbation Hospitalized in May with pneumonia and COPD exacerbation. Recent COVID-19 infection treated with molnupiravir. Currently on 2L oxygen and Trelegy inhaler. Reports fatigue and exertional dyspnea. -Order chest x-ray to assess for resolution of previous pneumonia and any new lung changes post-COVID. -Continue Trelegy inhaler nightly. -Encourage gradual increase in physical activity as tolerated. -Plan follow-up in 6 months.  COVID-19 Recent infection treated with molnupiravir. Currently recovering with residual fatigue. -Continue supportive care and gradual increase in physical activity.  Unused Medication (Symbicort)  Patient has unopened Symbicort inhalers at home. -Advise to keep Symbicort as a backup inhaler if Trelegy is unavailable.   Asthma, COPD overlap syndrome PFTs reviewed with severe obstruction. Trelegy inhaler better than breztri He is improving after recent hospitalization for CAP  Will benefit from exercise therapy and weight loss Finished pulmonary rehab Continue supplemental oxygen for now  Raynaud's syndrome He has history of Raynaud's syndrome and is taking sildenafil ANA is negative.  Plan/Recommendations: Continue Trelegy, supplemental oxygen Chest x-ray today Follow-up in 6 months   Chilton Greathouse MD [facility] 10/10/2022, 9:49  AM  CC: Copland, Gwenlyn Found, MD

## 2022-10-17 ENCOUNTER — Telehealth: Payer: Self-pay | Admitting: Family Medicine

## 2022-10-17 NOTE — Telephone Encounter (Signed)
Pt would like to know if he is up to date on all vaccines like covid, pneumonia, shingles etc. States he has been feeling tired and is worried he will come down with something. Please advise.

## 2022-10-18 NOTE — Telephone Encounter (Signed)
Please give him a call back. I believe he is all up to date except can get a new flu and covid booser this fall

## 2022-11-05 ENCOUNTER — Telehealth: Payer: Self-pay | Admitting: Family Medicine

## 2022-11-05 MED ORDER — TRAMADOL HCL 50 MG PO TABS: 50.0000 mg | ORAL_TABLET | Freq: Two times a day (BID) | ORAL | 0 refills | Status: DC | PRN

## 2022-11-05 NOTE — Addendum Note (Signed)
Addended by: Abbe Amsterdam C on: 11/05/2022 03:11 PM   Modules accepted: Orders

## 2022-11-05 NOTE — Telephone Encounter (Signed)
Prescription Request  11/05/2022  Is this a "Controlled Substance" medicine? No  LOV: 09/11/2022  What is the name of the medication or equipment? traMADol (ULTRAM) 50 MG tablet  Have you contacted your pharmacy to request a refill? No   Which pharmacy would you like this sent to?  [facility] [address] [place], [place] - [address] [place] [address] Phone: [phone] Fax: [phone]   Patient notified that their request is being sent to the clinical staff for review and that they should receive a response within 2 business days.   Please advise at [facility] [phone]

## 2022-11-05 NOTE — Telephone Encounter (Signed)
Okay for refill?

## 2022-11-11 ENCOUNTER — Other Ambulatory Visit: Payer: Self-pay

## 2022-11-11 ENCOUNTER — Telehealth: Payer: Self-pay | Admitting: Family Medicine

## 2022-11-11 MED ORDER — PRIMIDONE 50 MG PO TABS: 50.0000 mg | ORAL_TABLET | Freq: Two times a day (BID) | ORAL | 2 refills | Status: DC

## 2022-11-11 NOTE — Telephone Encounter (Signed)
Prescription Request  11/11/2022  Is this a "Controlled Substance" medicine? No  LOV: 09/11/2022  What is the name of the medication or equipment? primidone (MYSOLINE) 50 MG tablet   Have you contacted your pharmacy to request a refill? Yes   Which pharmacy would you like this sent to?   [facility] [place], [place] - [address] [place] [address] Phone: [phone] Fax: [phone]    Patient notified that their request is being sent to the clinical staff for review and that they should receive a response within 2 business days.   Please advise at [facility] [phone]

## 2022-11-11 NOTE — Telephone Encounter (Signed)
Refill sent.

## 2022-11-14 ENCOUNTER — Telehealth: Payer: Self-pay | Admitting: Family Medicine

## 2022-11-14 DIAGNOSIS — I10 Essential (primary) hypertension: Secondary | ICD-10-CM

## 2022-11-14 NOTE — Telephone Encounter (Signed)
Medication: amLODipine (NORVASC) 5 MG tablet  metFORMIN (GLUCOPHAGE) 500 MG tablet  Has the patient contacted their pharmacy? Pt states he tried to contact the pharmacy but could not get in touch with them.   Preferred Pharmacy:   [facility] - [place], [place] - [address] [facility] AT Portal to Registered [address] [place], [place] [address] Phone: [phone]  Fax: [phone]

## 2022-11-15 NOTE — Telephone Encounter (Signed)
[employer] was termed 07/29/22.   So, Im so confused on how to move forward. I called [facility] once more and they insisted that the pts pharmacy benefits are not with them. I am not sure who/where the pt has been or should be getting refills at this point.

## 2022-11-15 NOTE — Telephone Encounter (Signed)
Johnathan [name] did confirm [employer]. I have asked about the [facility] that's scanned in April 2024.

## 2022-11-15 NOTE — Telephone Encounter (Signed)
I called [facility] and they let me know that the pts account with them is inactive- meaning his insurance benefits do not cover there. (We have [employer] and Medicaid on file- so this might be inaccurate information)  I also called [facility] since this pharmacy was on file as well. They said the account did look active but this would mean he has a [employer] (which we have no records of).   I have asked Marylene Land to check Benefits so that I can call the correct pharmacy for his refills.

## 2022-11-16 MED ORDER — METFORMIN HCL 500 MG PO TABS: 250.0000 mg | ORAL_TABLET | Freq: Every day | ORAL | 3 refills | Status: DC

## 2022-11-16 MED ORDER — AMLODIPINE BESYLATE 5 MG PO TABS: 5.0000 mg | ORAL_TABLET | Freq: Every day | ORAL | 3 refills | Status: DC

## 2022-11-16 MED ORDER — PRIMIDONE 50 MG PO TABS: 50.0000 mg | ORAL_TABLET | Freq: Two times a day (BID) | ORAL | 2 refills | Status: DC

## 2022-11-16 NOTE — Progress Notes (Unsigned)
[facility] [address] [place], [place] [address] [phone]  Date:  11/20/2022   Name:  Johnathan [name]   DOB:  1932/11/15   MRN:  [id number]  PCP:  Pearline Cables, MD    Chief Complaint: No chief complaint on file.   History of Present Illness:  Johnathan [name] is a 89 y.o. very pleasant male patient who presents with the following:  Pt seen today with concern of fatigue Last seen by myself in August  History of emphysema, diabetes, COPD, hypertension, former smoker. Harriet had been doing excellently for age until he got COVID in February 2022; he subsequently developed pulmonary fibrosis and is still using oxygen   Flu shot Covid booster Can update A1c Eye exam Lab Results  Component Value Date   HGBA1C 6.6 (H) 05/06/2022     Patient Active Problem List   Diagnosis Date Noted   Severe sepsis (HCC) 06/18/2022   Hyperlipidemia associated with type 2 diabetes mellitus (HCC) 06/18/2022   Preop pulmonary/respiratory exam 04/09/2022   GERD (gastroesophageal reflux disease) 09/11/2021   Acute on chronic respiratory failure with hypoxia (HCC) 09/10/2021   Interstitial pulmonary disease (HCC) 03/19/2021   Hypertension associated with diabetes (HCC) 03/19/2021   Oral candidiasis 03/05/2021   Chronic respiratory failure with hypoxia (HCC) 03/05/2021   Trigger finger, right ring finger 02/13/2021   COPD with chronic bronchitis and emphysema (HCC) 04/26/2020   COVID-19 02/25/2020   Shingles 11/14/2019   Raynaud's syndrome 11/14/2019   Skin cancer 11/14/2019   Left inguinal hernia s/p lap repair w mesh 05/06/2019 05/06/2019   Recurrent right inguinal hernia s/p lap repair w mesh 05/06/2019 03/01/2019   Type 2 diabetes mellitus (HCC) 03/01/2019   Community acquired pneumonia of right lower lobe of lung 04/21/2014   COPD with acute exacerbation (HCC) 04/21/2014    Past Medical History:  Diagnosis Date   Anal  fissure    COPD (chronic obstructive pulmonary disease) (HCC)    Diverticulosis    DM type 2 (diabetes mellitus, type 2) (HCC)    GERD (gastroesophageal reflux disease)    uses prilosec    Hypertension    Pneumonia 2018   Raynaud's disease    Shortness of breath dyspnea    WITH EXERTION   Sinusitis    Tremors of nervous system     Past Surgical History:  Procedure Laterality Date   COLONOSCOPY  03/27/2005   ESOPHAGOGASTRODUODENOSCOPY (EGD) WITH PROPOFOL N/A 02/08/2021   Procedure: ESOPHAGOGASTRODUODENOSCOPY (EGD) WITH PROPOFOL;  Surgeon: Rachael Fee, MD;  Location: [facility];  Service: Endoscopy;  Laterality: N/A;   HERNIA REPAIR     INGUINAL HERNIA REPAIR N/A 05/06/2019   Procedure: LAPAROSCOPIC RIGHT AND LEFT INGUINAL HERNIA(S) REPAIR;  Surgeon: Karie Soda, MD;  Location: [facility];  Service: General;  Laterality: N/A;    Social History   Tobacco Use   Smoking status: Former    Current packs/day: 0.00    Average packs/day: 1 pack/day for 45.0 years (45.0 ttl pk-yrs)    Types: Cigarettes    Start date: 01/21/1949    Quit date: 01/21/1994    Years since quitting: 28.8   Smokeless tobacco: Never  Substance Use Topics   Alcohol use: Yes    Comment: a drink daily with dinner   Drug use: No    Family History  Problem Relation Age of Onset   Dementia Brother  Diabetes Brother    Heart attack Father    Dementia Father     No Known Allergies  Medication list has been reviewed and updated.  Current Outpatient Medications on File Prior to Visit  Medication Sig Dispense Refill   albuterol (VENTOLIN HFA) 108 (90 Base) MCG/ACT inhaler Inhale 2 puffs into the lungs every 6 (six) hours as needed for wheezing or shortness of breath. 18 g 2   amLODipine (NORVASC) 5 MG tablet Take 1 tablet (5 mg total) by mouth daily. 90 tablet 3   aspirin EC 81 MG tablet Take 81 mg by mouth every morning.     B Complex-C (SUPER B COMPLEX PO) Take 1 tablet by mouth at  bedtime.     Flaxseed, Linseed, (FLAXSEED OIL) 1200 MG CAPS Take 1,200 mg by mouth at bedtime.     furosemide (LASIX) 20 MG tablet Take 1 tablet (20 mg total) by mouth daily as needed for edema. 30 tablet 0   Ginkgo Biloba EXTR Take 1 capsule by mouth 2 (two) times daily.     guaiFENesin (MUCINEX) 600 MG 12 hr tablet Take 2 tablets (1,200 mg total) by mouth 2 (two) times daily. (Patient taking differently: Take 600 mg by mouth 3 (three) times daily.) 60 tablet 0   ketoconazole (NIZORAL) 2 % cream Apply 1 Application topically daily. Use as needed for fungal infection 15 g 1   ketoconazole (NIZORAL) 2 % shampoo Apply 1 Application topically 2 (two) times a week. Use as needed for itchy scalp 120 mL PRN   loratadine (CLARITIN) 10 MG tablet TAKE 1 TABLET EVERY DAY (Patient taking differently: Take 10 mg by mouth every morning.) 90 tablet 1   meloxicam (MOBIC) 15 MG tablet Take 1 tablet (15 mg total) by mouth in the morning. 90 tablet 0   metFORMIN (GLUCOPHAGE) 500 MG tablet Take 0.5 tablets (250 mg total) by mouth daily. 45 tablet 3   Misc Natural Products (HIMALAYAN GOJI PO) Take 6 Doses by mouth daily. Goji berries     Multiple Vitamin (MULTIVITAMIN WITH MINERALS) TABS tablet Take 1 tablet by mouth daily with breakfast.     omeprazole (PRILOSEC) 20 MG capsule Take 1 capsule (20 mg total) by mouth 2 (two) times daily. 180 capsule 3   OXYGEN Inhale 2 L/min into the lungs continuous.     pravastatin (PRAVACHOL) 40 MG tablet Take 0.5 tablets (20 mg total) by mouth daily. 45 tablet 0   primidone (MYSOLINE) 50 MG tablet Take 1 tablet (50 mg total) by mouth 2 (two) times daily. 200 tablet 2   SYSTANE COMPLETE PF 0.6 % SOLN Place 1 drop into the right eye in the morning, at noon, and at bedtime.     tadalafil (CIALIS) 20 MG tablet TAKE 1/2 TO 1 TAB po 2 HRS PRIOR TO SEXUAL ACTIVITY 15 tablet 5   terazosin (HYTRIN) 1 MG capsule Take 1 capsule (1 mg total) by mouth 2 (two) times daily. 180 capsule 0    traMADol (ULTRAM) 50 MG tablet Take 1 tablet (50 mg total) by mouth every 12 (twelve) hours as needed (for back pain). 30 tablet 0   TRELEGY ELLIPTA 100-62.5-25 MCG/ACT AEPB USE 1 INHALATION BY MOUTH INTO  THE LUNGS DAILY AT THE SAME TIME EACH DAY 180 each 3   triamcinolone cream (KENALOG) 0.1 % Apply 1 Application topically 2 (two) times daily as needed (rash/itching).     VITAMIN E PO Take 1 capsule by mouth daily with breakfast.  No current facility-administered medications on file prior to visit.    Review of Systems:  As per HPI- otherwise negative.   Physical Examination: There were no vitals filed for this visit. There were no vitals filed for this visit. There is no height or weight on file to calculate BMI. Ideal Body Weight:    GEN: no acute distress. HEENT: Atraumatic, Normocephalic.  Ears and Nose: No external deformity. CV: RRR, No M/G/R. No JVD. No thrill. No extra heart sounds. PULM: CTA B, no wheezes, crackles, rhonchi. No retractions. No resp. distress. No accessory muscle use. ABD: S, NT, ND, +BS. No rebound. No HSM. EXTR: No c/c/e PSYCH: Normally interactive. Conversant.    Assessment and Plan: ***  Signed Abbe Amsterdam, MD

## 2022-11-16 NOTE — Telephone Encounter (Signed)
Called pt- he wants rx sent to [facility].  He also wants to make an appt for next week which I scheduled for him

## 2022-11-20 ENCOUNTER — Ambulatory Visit: Payer: Medicare HMO | Admitting: Family Medicine

## 2022-11-20 ENCOUNTER — Encounter: Payer: Self-pay | Admitting: Family Medicine

## 2022-11-20 ENCOUNTER — Other Ambulatory Visit (HOSPITAL_BASED_OUTPATIENT_CLINIC_OR_DEPARTMENT_OTHER): Payer: Self-pay

## 2022-11-20 ENCOUNTER — Ambulatory Visit (HOSPITAL_BASED_OUTPATIENT_CLINIC_OR_DEPARTMENT_OTHER)
Admission: RE | Admit: 2022-11-20 | Discharge: 2022-11-20 | Disposition: A | Discharge status: Home / Self Care | Payer: Medicare HMO | Source: Ambulatory Visit | Attending: Family Medicine | Admitting: Family Medicine

## 2022-11-20 VITALS — BP 122/70 | HR 85 | Ht 69.0 in | Wt 153.4 lb

## 2022-11-20 DIAGNOSIS — M47816 Spondylosis without myelopathy or radiculopathy, lumbar region: Secondary | ICD-10-CM | POA: Diagnosis not present

## 2022-11-20 DIAGNOSIS — G8929 Other chronic pain: Secondary | ICD-10-CM

## 2022-11-20 DIAGNOSIS — M4317 Spondylolisthesis, lumbosacral region: Secondary | ICD-10-CM | POA: Diagnosis not present

## 2022-11-20 DIAGNOSIS — I10 Essential (primary) hypertension: Secondary | ICD-10-CM | POA: Diagnosis not present

## 2022-11-20 DIAGNOSIS — I700 Atherosclerosis of aorta: Secondary | ICD-10-CM | POA: Diagnosis not present

## 2022-11-20 DIAGNOSIS — E119 Type 2 diabetes mellitus without complications: Secondary | ICD-10-CM | POA: Diagnosis not present

## 2022-11-20 DIAGNOSIS — Z9981 Dependence on supplemental oxygen: Secondary | ICD-10-CM | POA: Diagnosis not present

## 2022-11-20 DIAGNOSIS — R0602 Shortness of breath: Secondary | ICD-10-CM | POA: Diagnosis not present

## 2022-11-20 DIAGNOSIS — R5383 Other fatigue: Secondary | ICD-10-CM

## 2022-11-20 DIAGNOSIS — D649 Anemia, unspecified: Secondary | ICD-10-CM | POA: Diagnosis not present

## 2022-11-20 DIAGNOSIS — J441 Chronic obstructive pulmonary disease with (acute) exacerbation: Secondary | ICD-10-CM

## 2022-11-20 DIAGNOSIS — Z23 Encounter for immunization: Secondary | ICD-10-CM | POA: Diagnosis not present

## 2022-11-20 DIAGNOSIS — M545 Low back pain, unspecified: Secondary | ICD-10-CM | POA: Diagnosis not present

## 2022-11-20 DIAGNOSIS — M549 Dorsalgia, unspecified: Secondary | ICD-10-CM | POA: Diagnosis not present

## 2022-11-20 DIAGNOSIS — M5136 Other intervertebral disc degeneration, lumbar region with discogenic back pain only: Secondary | ICD-10-CM | POA: Diagnosis not present

## 2022-11-20 LAB — COMPREHENSIVE METABOLIC PANEL
ALT: 10 U/L (ref 0–53)
AST: 14 U/L (ref 0–37)
Albumin: 4 g/dL (ref 3.5–5.2)
Alkaline Phosphatase: 81 U/L (ref 39–117)
BUN: 25 mg/dL — ABNORMAL HIGH (ref 6–23)
CO2: 29 meq/L (ref 19–32)
Calcium: 9.5 mg/dL (ref 8.4–10.5)
Chloride: 104 meq/L (ref 96–112)
Creatinine, Ser: 1.18 mg/dL (ref 0.40–1.50)
GFR: 55.25 mL/min — ABNORMAL LOW (ref >60.00)
Glucose, Bld: 116 mg/dL — ABNORMAL HIGH (ref 70–99)
Potassium: 4.6 meq/L (ref 3.5–5.1)
Sodium: 140 meq/L (ref 135–145)
Total Bilirubin: 0.3 mg/dL (ref 0.2–1.2)
Total Protein: 7.8 g/dL (ref 6.0–8.3)

## 2022-11-20 LAB — TSH: TSH: 4.35 u[IU]/mL (ref 0.35–5.50)

## 2022-11-20 LAB — CBC
HCT: 38.7 % — ABNORMAL LOW (ref 39.0–52.0)
Hemoglobin: 12.7 g/dL — ABNORMAL LOW (ref 13.0–17.0)
MCHC: 32.9 g/dL (ref 30.0–36.0)
MCV: 95.8 fL (ref 78.0–100.0)
Platelets: 203 10*3/uL (ref 150.0–400.0)
RBC: 4.04 Mil/uL — ABNORMAL LOW (ref 4.22–5.81)
RDW: 14.4 % (ref 11.5–15.5)
WBC: 4.3 10*3/uL (ref 4.0–10.5)

## 2022-11-20 LAB — HEMOGLOBIN A1C: Hgb A1c MFr Bld: 6.6 % — ABNORMAL HIGH (ref 4.6–6.5)

## 2022-11-20 MED ORDER — COVID-19 MRNA VAC-TRIS(PFIZER) 30 MCG/0.3ML IM SUSY
0.3000 mL | PREFILLED_SYRINGE | Freq: Once | INTRAMUSCULAR | 0 refills | Status: AC
  Filled 2022-11-20: qty 0.3, 1d supply, fill #0

## 2022-11-20 NOTE — Patient Instructions (Signed)
It was good to see you today- please stop by lab and then imaging on the ground floor for x-rays. I will be in touch with your results asap  If you are in any distress please seek care right away

## 2022-11-20 NOTE — Addendum Note (Signed)
Addended by: Abbe Amsterdam C on: 11/20/2022 06:51 PM   Modules accepted: Orders

## 2022-11-21 ENCOUNTER — Telehealth: Payer: Self-pay | Admitting: Family Medicine

## 2022-11-21 NOTE — Telephone Encounter (Signed)
Patient called to find out results of his labs/xray. Advised that Dr. Patsy Lager send him mychart messages with those results. Pt will review and call back if he has questions.    Pt also said his back is still hurting and he took a high powered tylenol and has been sleeping it off. Please call with recommendations.

## 2022-11-22 NOTE — Telephone Encounter (Signed)
Pt received lab results via mychart- will send ifob in the mail.

## 2022-11-25 NOTE — Progress Notes (Signed)
Mailed

## 2022-11-27 ENCOUNTER — Telehealth: Payer: Self-pay | Admitting: Pulmonary Disease

## 2022-11-28 NOTE — Telephone Encounter (Signed)
Sent on 10/05/22 to [facility]. LMTCB  TRELEGY ELLIPTA 100-62.5-25 MCG/ACT AEPB 180 each 3 10/05/2022 --   Sig: USE 1 INHALATION BY MOUTH INTO  THE LUNGS DAILY AT THE SAME TIME EACH DAY   Sent to pharmacy as: Dwyane Luo 100-62.5-25 MCG/ACT Aerosol Powder, Breath Activtivatede   Notes to Pharmacy: Please send a replace/new response with 100-Day Supply if appropriate to maximize member benefit. Requesting 1 year supply.   E-Prescribing Status: Receipt confirmed by pharmacy (10/05/2022  7:40 AM EDT)

## 2022-11-29 MED ORDER — TRELEGY ELLIPTA 100-62.5-25 MCG/ACT IN AEPB: 1.0000 | INHALATION_SPRAY | Freq: Every day | RESPIRATORY_TRACT | 0 refills | Status: DC

## 2022-11-29 NOTE — Telephone Encounter (Signed)
Patient's insurance coverage expired on [employer] [facility] profile. Updated with rep and he will try to run and call office back to verily.  1 month supply of Trelegy sent to local pharmacy per pt request.

## 2022-12-17 ENCOUNTER — Telehealth: Payer: Self-pay | Admitting: Family Medicine

## 2022-12-17 ENCOUNTER — Other Ambulatory Visit: Payer: Self-pay

## 2022-12-17 DIAGNOSIS — R1319 Other dysphagia: Secondary | ICD-10-CM

## 2022-12-17 MED ORDER — OMEPRAZOLE 20 MG PO CPDR: 20.0000 mg | DELAYED_RELEASE_CAPSULE | Freq: Two times a day (BID) | ORAL | 3 refills | Status: DC

## 2022-12-17 NOTE — Telephone Encounter (Signed)
Rx sent 

## 2022-12-17 NOTE — Telephone Encounter (Signed)
Prescription Request  12/17/2022  Is this a "Controlled Substance" medicine? No  LOV: 11/20/2022  What is the name of the medication or equipment?   omeprazole (PRILOSEC) 20 MG capsule [[id number]]  Have you contacted your pharmacy to request a refill? No   Which pharmacy would you like this sent to?   [facility] [address] [place], [place] - [address] [place] [address] Phone: [phone] Fax: [phone]  Patient notified that their request is being sent to the clinical staff for review and that they should receive a response within 2 business days.   Please advise at Mobile [phone] (mobile)

## 2022-12-27 ENCOUNTER — Other Ambulatory Visit: Payer: Self-pay

## 2022-12-27 ENCOUNTER — Telehealth: Payer: Self-pay | Admitting: Family Medicine

## 2022-12-27 DIAGNOSIS — N529 Male erectile dysfunction, unspecified: Secondary | ICD-10-CM

## 2022-12-27 MED ORDER — TADALAFIL 20 MG PO TABS: ORAL_TABLET | ORAL | 5 refills | Status: DC

## 2022-12-27 NOTE — Telephone Encounter (Signed)
Rx refilled.

## 2022-12-27 NOTE — Telephone Encounter (Signed)
Prescription Request  12/27/2022  Is this a "Controlled Substance" medicine? Yes  LOV: 11/20/2022  What is the name of the medication or equipment?  tadalafil (CIALIS) 20 MG tablet  Have you contacted your pharmacy to request a refill? No   Which pharmacy would you like this sent to?  [facility] [address] [place], [place] - [address] [place] [address] Phone: [phone] Fax: [phone]      Patient notified that their request is being sent to the clinical staff for review and that they should receive a response within 2 business days.   Please advise at Mobile [phone] (mobile)

## 2023-01-01 ENCOUNTER — Telehealth: Payer: Self-pay | Admitting: Family Medicine

## 2023-01-01 NOTE — Telephone Encounter (Signed)
Okay for refill? Rx was d/c in June 2024

## 2023-01-01 NOTE — Telephone Encounter (Signed)
Prescription Request  01/01/2023  Is this a "Controlled Substance" medicine? Yes  LOV: 11/20/2022  What is the name of the medication or equipment?   HYDROcodone bit-homatropine (HYCODAN) 5-1.5 MG/5ML syrup [[id number]]  ENDED  Have you contacted your pharmacy to request a refill? No   Which pharmacy would you like this sent to?   [facility] [address] [place], [place] - [address] [place] [address] Phone: [phone] Fax: [phone]  Patient notified that their request is being sent to the clinical staff for review and that they should receive a response within 2 business days.   Please advise at Mobile [phone] (mobile)

## 2023-01-02 MED ORDER — HYDROCODONE BIT-HOMATROP MBR 5-1.5 MG/5ML PO SOLN: 5.0000 mL | Freq: Three times a day (TID) | ORAL | 0 refills | Status: AC | PRN

## 2023-01-02 NOTE — Addendum Note (Signed)
Addended by: Abbe Amsterdam C on: 01/02/2023 05:28 PM   Modules accepted: Orders

## 2023-01-02 NOTE — Telephone Encounter (Signed)
I called patient back.  He notes he occasionally uses hydrocodone cough syrup for cough.  He last filled a prescription about 6 months ago.  Reasonable for him to have a refill, I sent this to [facility].  Encouraged him to use sparingly and to be cautious of sedation

## 2023-01-06 NOTE — Progress Notes (Unsigned)
[facility] at [facility] [address], [address] [place], [place] [address] [phone]  Date:  01/08/2023   Name:  Johnathan [name]   DOB:  1932-03-18   MRN:  [id number]  PCP:  Pearline Cables, MD    Chief Complaint: No chief complaint on file.   History of Present Illness:  Johnathan [name] is a 89 y.o. very pleasant male patient who presents with the following:  Patient seen today for periodic follow-up Most recent visit with myself was about 6 weeks ago History of emphysema, diabetes, COPD, hypertension, former smoker. Jiaire had been doing excellently for age until he got COVID in February 2022; he subsequently developed pulmonary fibrosis and is still using oxygen He was admitted to the hospital in May of this year with a COPD exacerbation  Lab Results  Component Value Date   HGBA1C 6.6 (H) 11/20/2022     Patient Active Problem List   Diagnosis Date Noted   Severe sepsis (HCC) 06/18/2022   Hyperlipidemia associated with type 2 diabetes mellitus (HCC) 06/18/2022   Preop pulmonary/respiratory exam 04/09/2022   GERD (gastroesophageal reflux disease) 09/11/2021   Acute on chronic respiratory failure with hypoxia (HCC) 09/10/2021   Interstitial pulmonary disease (HCC) 03/19/2021   Hypertension associated with diabetes (HCC) 03/19/2021   Oral candidiasis 03/05/2021   Chronic respiratory failure with hypoxia (HCC) 03/05/2021   Trigger finger, right ring finger 02/13/2021   COPD with chronic bronchitis and emphysema (HCC) 04/26/2020   COVID-19 02/25/2020   Shingles 11/14/2019   Raynaud's syndrome 11/14/2019   Skin cancer 11/14/2019   Left inguinal hernia s/p lap repair w mesh 05/06/2019 05/06/2019   Recurrent right inguinal hernia s/p lap repair w mesh 05/06/2019 03/01/2019   Type 2 diabetes mellitus (HCC) 03/01/2019   Community acquired pneumonia of right lower lobe of lung 04/21/2014   COPD with acute exacerbation (HCC) 04/21/2014     Past Medical History:  Diagnosis Date   Anal fissure    COPD (chronic obstructive pulmonary disease) (HCC)    Diverticulosis    DM type 2 (diabetes mellitus, type 2) (HCC)    GERD (gastroesophageal reflux disease)    uses prilosec    Hypertension    Pneumonia 2018   Raynaud's disease    Shortness of breath dyspnea    WITH EXERTION   Sinusitis    Tremors of nervous system     Past Surgical History:  Procedure Laterality Date   COLONOSCOPY  03/27/2005   ESOPHAGOGASTRODUODENOSCOPY (EGD) WITH PROPOFOL N/A 02/08/2021   Procedure: ESOPHAGOGASTRODUODENOSCOPY (EGD) WITH PROPOFOL;  Surgeon: Rachael Fee, MD;  Location: [facility];  Service: Endoscopy;  Laterality: N/A;   HERNIA REPAIR     INGUINAL HERNIA REPAIR N/A 05/06/2019   Procedure: LAPAROSCOPIC RIGHT AND LEFT INGUINAL HERNIA(S) REPAIR;  Surgeon: Karie Soda, MD;  Location: [facility];  Service: General;  Laterality: N/A;    Social History   Tobacco Use   Smoking status: Former    Current packs/day: 0.00    Average packs/day: 1 pack/day for 45.0 years (45.0 ttl pk-yrs)    Types: Cigarettes    Start date: 01/21/1949    Quit date: 01/21/1994    Years since quitting: 28.9   Smokeless tobacco: Never  Substance Use Topics   Alcohol use: Yes    Comment: a drink daily with dinner   Drug use: No    Family History  Problem Relation Age of Onset  Dementia Brother    Diabetes Brother    Heart attack Father    Dementia Father     No Known Allergies  Medication list has been reviewed and updated.  Current Outpatient Medications on File Prior to Visit  Medication Sig Dispense Refill   albuterol (VENTOLIN HFA) 108 (90 Base) MCG/ACT inhaler Inhale 2 puffs into the lungs every 6 (six) hours as needed for wheezing or shortness of breath. 18 g 2   amLODipine (NORVASC) 5 MG tablet Take 1 tablet (5 mg total) by mouth daily. 90 tablet 3   aspirin EC 81 MG tablet Take 81 mg by mouth every morning.     B  Complex-C (SUPER B COMPLEX PO) Take 1 tablet by mouth at bedtime.     Flaxseed, Linseed, (FLAXSEED OIL) 1200 MG CAPS Take 1,200 mg by mouth at bedtime.     Fluticasone-Umeclidin-Vilant (TRELEGY ELLIPTA) 100-62.5-25 MCG/ACT AEPB Inhale 1 puff into the lungs daily. USE 1 INHALATION BY MOUTH INTO  THE LUNGS DAILY AT THE SAME TIME EACH DAY 60 each 0   furosemide (LASIX) 20 MG tablet Take 1 tablet (20 mg total) by mouth daily as needed for edema. 30 tablet 0   Ginkgo Biloba EXTR Take 1 capsule by mouth 2 (two) times daily.     guaiFENesin (MUCINEX) 600 MG 12 hr tablet Take 2 tablets (1,200 mg total) by mouth 2 (two) times daily. (Patient taking differently: Take 600 mg by mouth 3 (three) times daily.) 60 tablet 0   HYDROcodone bit-homatropine (HYCODAN) 5-1.5 MG/5ML syrup Take 5 mLs by mouth every 8 (eight) hours as needed for up to 5 days for cough. 90 mL 0   ketoconazole (NIZORAL) 2 % cream Apply 1 Application topically daily. Use as needed for fungal infection 15 g 1   ketoconazole (NIZORAL) 2 % shampoo Apply 1 Application topically 2 (two) times a week. Use as needed for itchy scalp 120 mL PRN   loratadine (CLARITIN) 10 MG tablet TAKE 1 TABLET EVERY DAY (Patient taking differently: Take 10 mg by mouth every morning.) 90 tablet 1   meloxicam (MOBIC) 15 MG tablet Take 1 tablet (15 mg total) by mouth in the morning. 90 tablet 0   metFORMIN (GLUCOPHAGE) 500 MG tablet Take 0.5 tablets (250 mg total) by mouth daily. 45 tablet 3   Misc Natural Products (HIMALAYAN GOJI PO) Take 6 Doses by mouth daily. Goji berries     Multiple Vitamin (MULTIVITAMIN WITH MINERALS) TABS tablet Take 1 tablet by mouth daily with breakfast.     omeprazole (PRILOSEC) 20 MG capsule Take 1 capsule (20 mg total) by mouth 2 (two) times daily. 180 capsule 3   OXYGEN Inhale 2 L/min into the lungs continuous.     pravastatin (PRAVACHOL) 40 MG tablet Take 0.5 tablets (20 mg total) by mouth daily. 45 tablet 0   primidone (MYSOLINE) 50 MG  tablet Take 1 tablet (50 mg total) by mouth 2 (two) times daily. 200 tablet 2   SYSTANE COMPLETE PF 0.6 % SOLN Place 1 drop into the right eye in the morning, at noon, and at bedtime.     tadalafil (CIALIS) 20 MG tablet TAKE 1/2 TO 1 TAB po 2 HRS PRIOR TO SEXUAL ACTIVITY 15 tablet 5   terazosin (HYTRIN) 1 MG capsule Take 1 capsule (1 mg total) by mouth 2 (two) times daily. 180 capsule 0   traMADol (ULTRAM) 50 MG tablet Take 1 tablet (50 mg total) by mouth every 12 (twelve) hours as  needed (for back pain). 30 tablet 0   triamcinolone cream (KENALOG) 0.1 % Apply 1 Application topically 2 (two) times daily as needed (rash/itching).     VITAMIN E PO Take 1 capsule by mouth daily with breakfast.     No current facility-administered medications on file prior to visit.    Review of Systems:  As per HPI- otherwise negative.   Physical Examination: There were no vitals filed for this visit. There were no vitals filed for this visit. There is no height or weight on file to calculate BMI. Ideal Body Weight:    GEN: no acute distress. HEENT: Atraumatic, Normocephalic.  Ears and Nose: No external deformity. CV: RRR, No M/G/R. No JVD. No thrill. No extra heart sounds. PULM: CTA B, no wheezes, crackles, rhonchi. No retractions. No resp. distress. No accessory muscle use. ABD: S, NT, ND, +BS. No rebound. No HSM. EXTR: No c/c/e PSYCH: Normally interactive. Conversant.    Assessment and Plan: ***  Signed Abbe Amsterdam, MD

## 2023-01-08 ENCOUNTER — Ambulatory Visit (INDEPENDENT_AMBULATORY_CARE_PROVIDER_SITE_OTHER): Payer: Medicare HMO | Admitting: Family Medicine

## 2023-01-08 VITALS — BP 124/70 | HR 55 | Temp 98.1°F | Resp 18 | Ht 69.0 in | Wt 150.2 lb

## 2023-01-08 DIAGNOSIS — G8929 Other chronic pain: Secondary | ICD-10-CM

## 2023-01-08 DIAGNOSIS — J441 Chronic obstructive pulmonary disease with (acute) exacerbation: Secondary | ICD-10-CM | POA: Diagnosis not present

## 2023-01-08 DIAGNOSIS — M545 Low back pain, unspecified: Secondary | ICD-10-CM | POA: Diagnosis not present

## 2023-01-08 MED ORDER — TRELEGY ELLIPTA 100-62.5-25 MCG/ACT IN AEPB: 1.0000 | INHALATION_SPRAY | Freq: Every day | RESPIRATORY_TRACT | 11 refills | Status: DC

## 2023-01-08 NOTE — Patient Instructions (Addendum)
It was good to see you today Continue Trellegy - I gave you a sample to get you started Please see me for a recheck in about 4 months

## 2023-01-10 ENCOUNTER — Telehealth: Payer: Self-pay | Admitting: Family Medicine

## 2023-01-10 NOTE — Telephone Encounter (Signed)
Copied from CRM [id number]. Topic: Medicare AWV >> Jan 10, 2023  9:24 AM Payton Doughty wrote: Reason for CRM:  Called LVM 01/10/2023 to schedule AWV. Please schedule office or virtual visits.  Verlee Rossetti; Care Guide Ambulatory Clinical Support [place] [facility] Direct Dial: [phone]

## 2023-01-10 NOTE — Telephone Encounter (Signed)
Looks like pt was returning this call.

## 2023-01-10 NOTE — Telephone Encounter (Signed)
Copied from CRM [id number]. Topic: General - Other >> Jan 10, 2023  9:31 AM Johnathan [name] wrote: Reason for CRM: Pt Is returning call from the office. Was not aware of why he received call. Requests call back, CB# [phone]

## 2023-01-18 IMAGING — CT CT CHEST HIGH RESOLUTION
1 of 4 series · 13 of 32 positions shown, 17 images · non-contrast
Comparison: 04/28/2020 high-resolution chest CT.

CLINICAL DATA: Follow-up interstitial lung disease. History of
COVID infection in January 2020. COPD. Remote smoking history.



[Series 10: super d · axial · 0.75mm/px · z∈[+695,+1009]mm · 13 of 585 slices shown, 17 images]
[im 31/585  mediastinal]
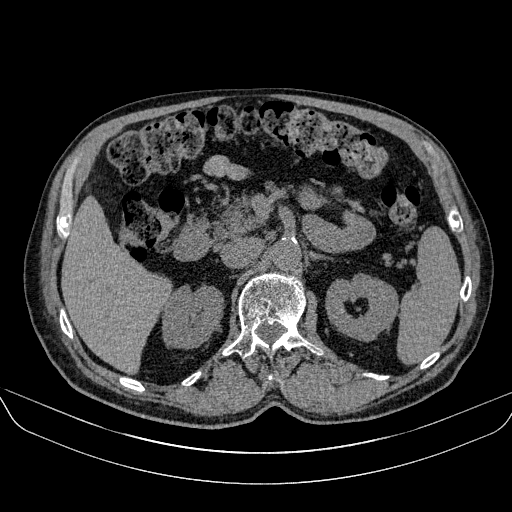
[im 31/585  lung]
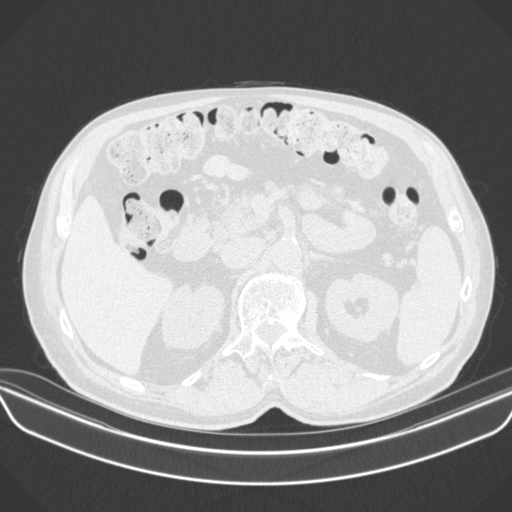
[im 93/585  lung]
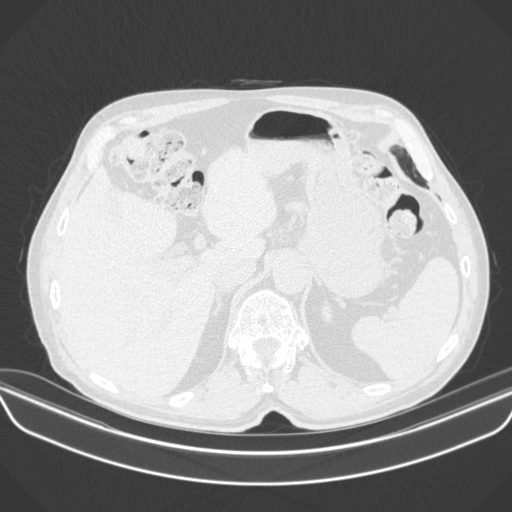
[im 123/585  lung]
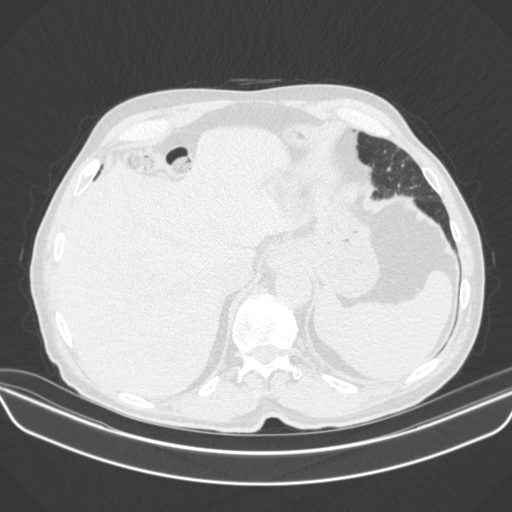
[im 185/585  lung]
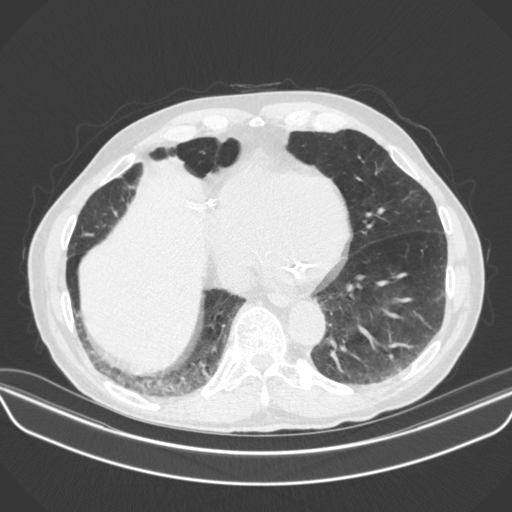
[im 216/585  mediastinal]
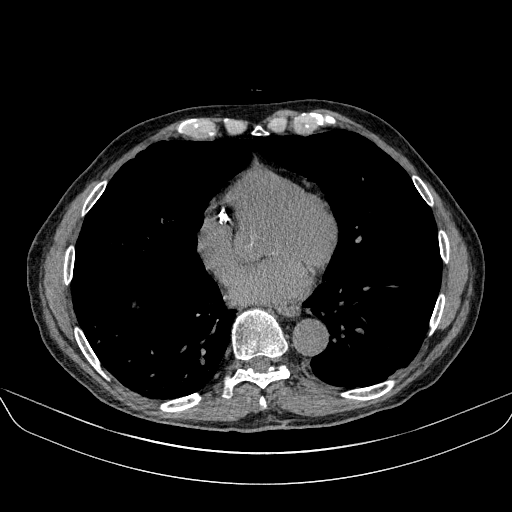
[im 216/585  lung]
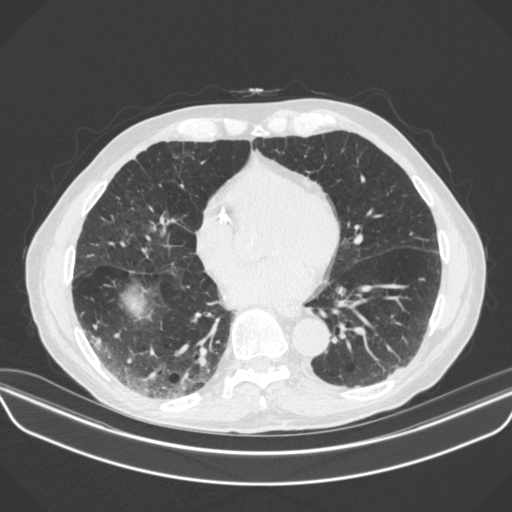
[im 277/585  lung]
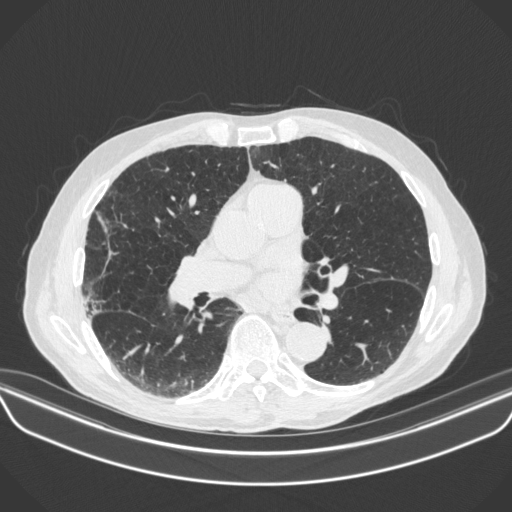
[im 293/585  lung]
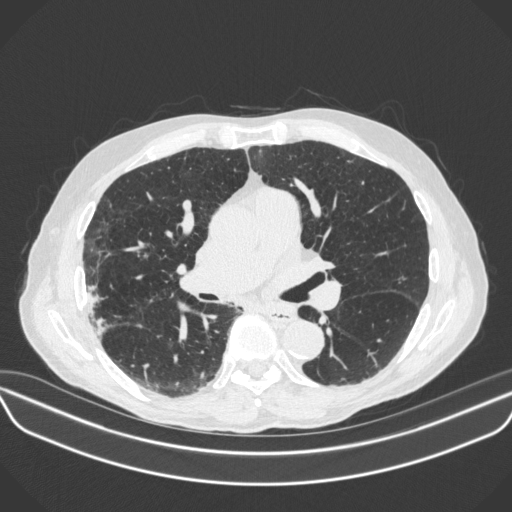
[im 308/585  lung]
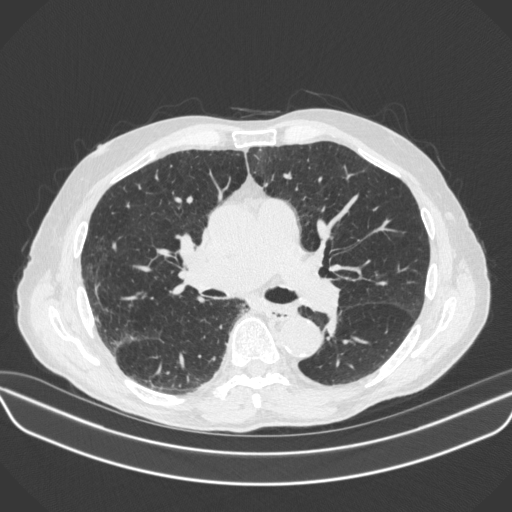
[im 369/585  mediastinal]
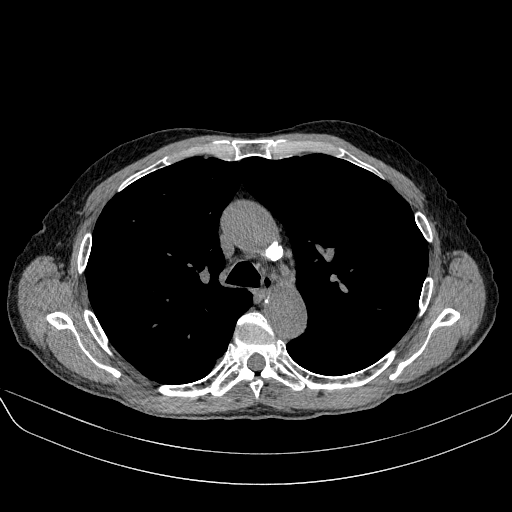
[im 369/585  lung]
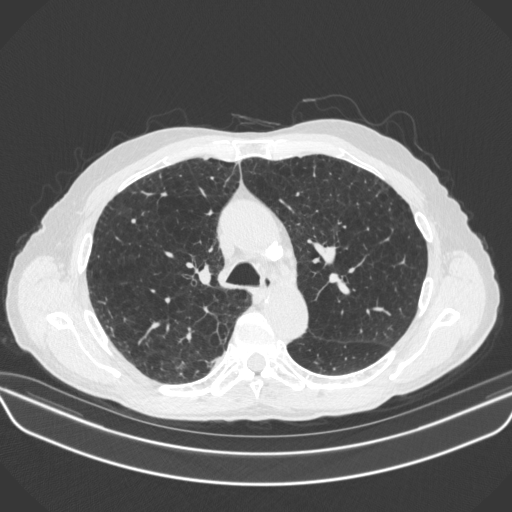
[im 400/585  lung]
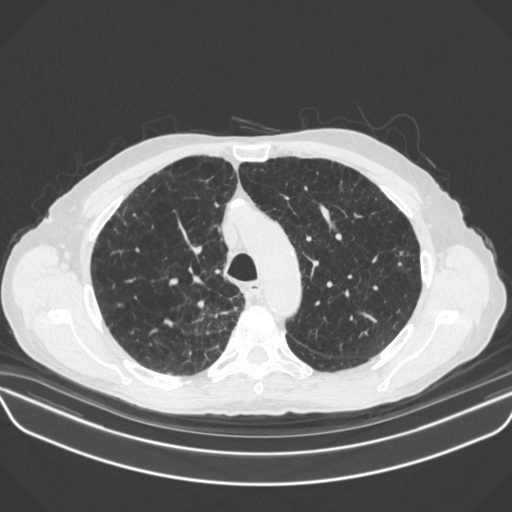
[im 462/585  lung]
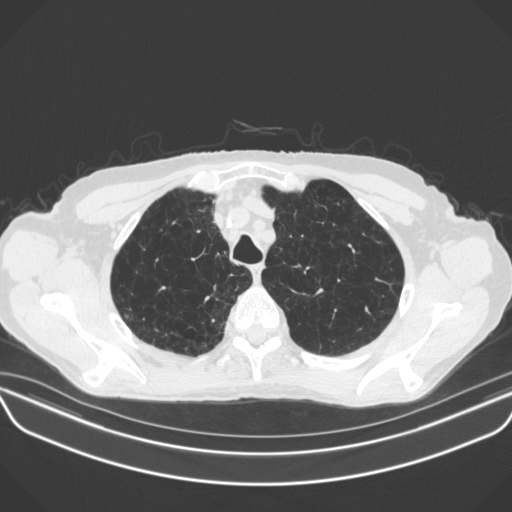
[im 492/585  lung]
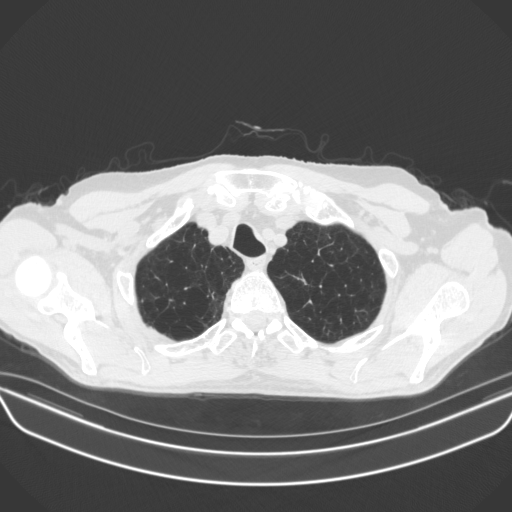
[im 554/585  mediastinal]
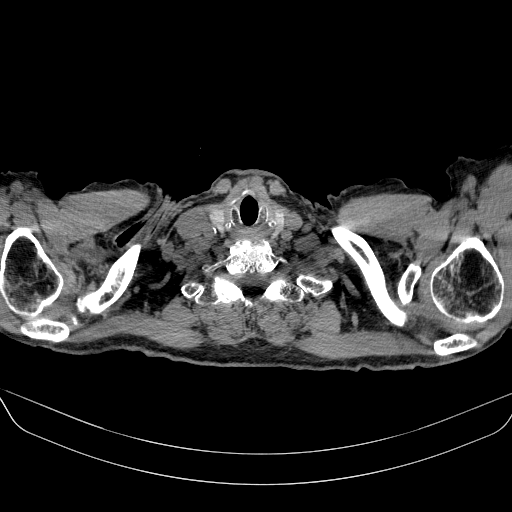
[im 554/585  lung]
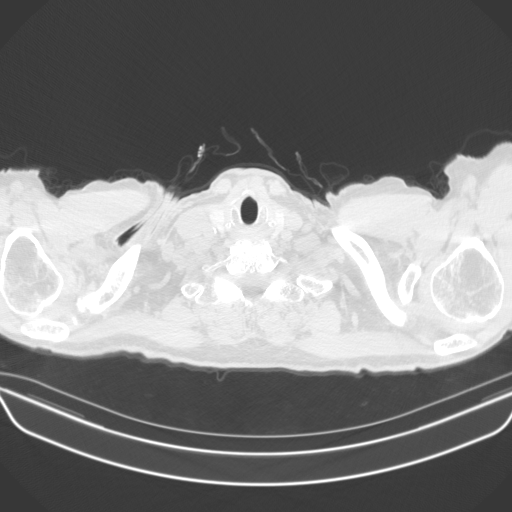

[13 of 32 positions shown; findings below may reference images not displayed]

FINDINGS: Cardiovascular: Normal heart size. No significant pericardial
effusion/thickening. Three-vessel coronary atherosclerosis.
Atherosclerotic nonaneurysmal thoracic aorta. Normal caliber
pulmonary arteries.

Mediastinum/Nodes: No discrete thyroid nodules. Unremarkable
esophagus. No pathologically enlarged axillary, mediastinal or hilar
lymph nodes, noting limited sensitivity for the detection of hilar
adenopathy on this noncontrast study.

Lungs/Pleura: No pneumothorax. No pleural effusion. Severe
centrilobular emphysema. New irregular 1.8 x 1.5 cm nodular focus of
consolidation in the posteromedial right upper lobe (series 7/image
48). New patchy tree-in-bud opacity in the dependent peripheral
right upper lobe. New solid 0.5 cm peripheral left upper lobe
pulmonary nodule (series 7/image 54). No significant lobular air
trapping on the expiration sequence. There is central bronchomalacia
on the expiration sequence (series 5/image 9). Mild patchy
subpleural ground-glass opacity and reticulation in both lungs, with
a dependent lower lobe predominance. Scattered mild cylindrical
bronchiectasis predominantly in the dependent lungs, for example in
the posteromedial right lower lobe (series 19/image 233). These
findings are not substantially changed. No frank honeycombing.

Upper abdomen: Cholelithiasis.

Musculoskeletal: No aggressive appearing focal osseous lesions. Mild
thoracic spondylosis.
IMPRESSION: 1. New irregular 1.8 cm nodular focus of consolidation in the
posteromedial right upper lobe. New patchy tree-in-bud opacity in
the dependent peripheral right upper lobe. Follow-up noncontrast
chest CT recommended in 3 months.
2. New solid 0.5 cm peripheral left upper lobe pulmonary nodule,
which also can be reassessed on the above three-month follow-up
chest CT.
3. Mild patchy subpleural ground-glass opacity and reticulation in
both lungs, with a dependent lower lobe predominance. Scattered mild
cylindrical bronchiectasis. No frank honeycombing. These findings
are not substantially changed. Favor bland nonspecific
postinfectious/postinflammatory scarring. An underlying
nonprogressive mild interstitial lung disease such is NSIP or UIP is
not excluded, however is less favored. Findings are indeterminate
for UIP per consensus guidelines: Diagnosis of Idiopathic Pulmonary
Fibrosis: An Official ATS/ERS/JRS/ALAT Clinical Practice Guideline.
Am J Respir Crit Care Med Vol 198, Yelitza 5, ppe11-e[DATE].
4. Three-vessel coronary atherosclerosis.
5. Cholelithiasis.
6. Aortic Atherosclerosis (2ZH64-8PY.Y) and Emphysema (2ZH64-FFL.D).

## 2023-01-22 DIAGNOSIS — U071 COVID-19: Secondary | ICD-10-CM | POA: Diagnosis not present

## 2023-01-28 ENCOUNTER — Ambulatory Visit (INDEPENDENT_AMBULATORY_CARE_PROVIDER_SITE_OTHER): Payer: 59

## 2023-01-28 VITALS — Ht 69.0 in | Wt 150.0 lb

## 2023-01-28 DIAGNOSIS — Z0000 Encounter for general adult medical examination without abnormal findings: Secondary | ICD-10-CM | POA: Diagnosis not present

## 2023-01-28 NOTE — Patient Instructions (Addendum)
 Johnathan [name] , Thank you for taking time to come for your Medicare Wellness Visit. I appreciate your ongoing commitment to your health goals. Please review the following plan we discussed and let me know if I can assist you in the future.   Referrals/Orders/Follow-Ups/Clinician Recommendations:   This is a list of the screening recommended for you and due dates:  Health Maintenance  Topic Date Due   Eye exam for diabetics  Never done   COVID-19 Vaccine (6 - 2024-25 season) 01/15/2023   Complete foot exam   05/06/2023   Hemoglobin A1C  05/21/2023   Medicare Annual Wellness Visit  01/28/2024   DTaP/Tdap/Td vaccine (3 - Tdap) 08/01/2030   Pneumonia Vaccine  Completed   Flu Shot  Completed   Zoster (Shingles) Vaccine  Completed   HPV Vaccine  Aged Out   Opioid Pain Medicine Management Opioids are powerful medicines that are used to treat moderate to severe pain. When used for short periods of time, they can help you to: Sleep better. Do better in physical or occupational therapy. Feel better in the first few days after an injury. Recover from surgery. Opioids should be taken with the supervision of a trained health care provider. They should be taken for the shortest period of time possible. This is because opioids can be addictive, and the longer you take opioids, the greater your risk of addiction. This addiction can also be called opioid use disorder. What are the risks? Using opioid pain medicines for longer than 3 days increases your risk of side effects. Side effects include: Constipation. Nausea and vomiting. Breathing difficulties (respiratory depression). Drowsiness. Confusion. Opioid use disorder. Itching. Taking opioid pain medicine for a long period of time can affect your ability to do daily tasks. It also puts you at risk for: Motor vehicle crashes. Depression. Suicide. Heart attack. Overdose, which can be life-threatening. What is a pain treatment plan? A pain  treatment plan is an agreement between you and your health care provider. Pain is unique to each person, and treatments vary depending on your condition. To manage your pain, you and your health care provider need to work together. To help you do this: Discuss the goals of your treatment, including how much pain you might expect to have and how you will manage the pain. Review the risks and benefits of taking opioid medicines. Remember that a good treatment plan uses more than one approach and minimizes the chance of side effects. Be honest about the amount of medicines you take and about any drug or alcohol use. Get pain medicine prescriptions from only one health care provider. Pain can be managed with many types of alternative treatments. Ask your health care provider to refer you to one or more specialists who can help you manage pain through: Physical or occupational therapy. Counseling (cognitive behavioral therapy). Good nutrition. Biofeedback. Massage. Meditation. Non-opioid medicine. Following a gentle exercise program. How to use opioid pain medicine Taking medicine Take your pain medicine exactly as told by your health care provider. Take it only when you need it. If your pain gets less severe, you may take less than your prescribed dose if your health care provider approves. If you are not having pain, do nottake pain medicine unless your health care provider tells you to take it. If your pain is severe, do nottry to treat it yourself by taking more pills than instructed on your prescription. Contact your health care provider for help. Write down the times when you take  your pain medicine. It is easy to become confused while on pain medicine. Writing the time can help you avoid overdose. Take other over-the-counter or prescription medicines only as told by your health care provider. Keeping yourself and others safe  While you are taking opioid pain medicine: Do not drive, use  machinery, or power tools. Do not sign legal documents. Do not drink alcohol. Do not take sleeping pills. Do not supervise children by yourself. Do not do activities that require climbing or being in high places. Do not go to a lake, river, ocean, spa, or swimming pool. Do not share your pain medicine with anyone. Keep pain medicine in a locked cabinet or in a secure area where pets and children cannot reach it. Stopping your use of opioids If you have been taking opioid medicine for more than a few weeks, you may need to slowly decrease (taper) how much you take until you stop completely. Tapering your use of opioids can decrease your risk of symptoms of withdrawal, such as: Pain and cramping in the abdomen. Nausea. Sweating. Sleepiness. Restlessness. Uncontrollable shaking (tremors). Cravings for the medicine. Do not attempt to taper your use of opioids on your own. Talk with your health care provider about how to do this. Your health care provider may prescribe a step-down schedule based on how much medicine you are taking and how long you have been taking it. Getting rid of leftover pills Do not save any leftover pills. Get rid of leftover pills safely by: Taking the medicine to a prescription take-back program. This is usually offered by the county or law enforcement. Bringing them to a pharmacy that has a drug disposal container. Flushing them down the toilet. Check the label or package insert of your medicine to see whether this is safe to do. Throwing them out in the trash. Check the label or package insert of your medicine to see whether this is safe to do. If it is safe to throw it out, remove the medicine from the original container, put it into a sealable bag or container, and mix it with used coffee grounds, food scraps, dirt, or cat litter before putting it in the trash. Follow these instructions at home: Activity Do exercises as told by your health care provider. Avoid  activities that make your pain worse. Return to your normal activities as told by your health care provider. Ask your health care provider what activities are safe for you. General instructions You may need to take these actions to prevent or treat constipation: Drink enough fluid to keep your urine pale yellow. Take over-the-counter or prescription medicines. Eat foods that are high in fiber, such as beans, whole grains, and fresh fruits and vegetables. Limit foods that are high in fat and processed sugars, such as fried or sweet foods. Keep all follow-up visits. This is important. Where to find support If you have been taking opioids for a long time, you may benefit from receiving support for quitting from a local support group or counselor. Ask your health care provider for a referral to these resources in your area. Where to find more information Centers for Disease Control and Prevention (CDC): footballexhibition.com.br [place] Food and Drug Administration (FDA): pumpkinsearch.com.ee Get help right away if: You may have taken too much of an opioid (overdosed). Common symptoms of an overdose: Your breathing is slower or more shallow than normal. You have a very slow heartbeat (pulse). You have slurred speech. You have nausea and vomiting. Your pupils become  very small. You have other potential symptoms: You are very confused. You faint or feel like you will faint. You have cold, clammy skin. You have blue lips or fingernails. You have thoughts of harming yourself or harming others. These symptoms may represent a serious problem that is an emergency. Do not wait to see if the symptoms will go away. Get medical help right away. Call your local emergency services (911 in the [place] Do not drive yourself to the hospital.  If you ever feel like you may hurt yourself or others, or have thoughts about taking your own life, get help right away. Go to your nearest emergency department or: Call your local emergency  services (911 in the [place] Call the [facility] ((858) 061-2593 in the [place] Call a suicide crisis helpline, such as the National Suicide Prevention Lifeline at 216-585-3874 or 988 in the [place] This is open 24 hours a day in the [place] Text the Crisis Text Line at 619-783-2641 (in the [place] Summary Opioid medicines can help you manage moderate to severe pain for a short period of time. A pain treatment plan is an agreement between you and your health care provider. Discuss the goals of your treatment, including how much pain you might expect to have and how you will manage the pain. If you think that you or someone else may have taken too much of an opioid, get medical help right away. This information is not intended to replace advice given to you by your health care provider. Make sure you discuss any questions you have with your health care provider. Document Revised: 08/02/2020 Document Reviewed: 04/19/2020 [employer] Patient Education  2024 [employer]  Advanced directives: (In Chart) A copy of your advanced directives are scanned into your chart should your provider ever need it.  Next Medicare Annual Wellness Visit scheduled for next year: Yes

## 2023-01-28 NOTE — Progress Notes (Signed)
 Subjective:   Johnathan [name] is a 89 y.o. male who presents for Medicare Annual/Subsequent preventive examination.  Visit Complete: Virtual I connected with  Johnathan [name] on 01/28/23 by a audio enabled telemedicine application and verified that I am speaking with the correct person using two identifiers.  Patient Location: Home  Provider Location: Home Office  I discussed the limitations of evaluation and management by telemedicine. The patient expressed understanding and agreed to proceed.  Vital Signs: Because this visit was a virtual/telehealth visit, some criteria may be missing or patient reported. Any vitals not documented were not able to be obtained and vitals that have been documented are patient reported.   Cardiac Risk Factors include: advanced age (>66men, >56 women);hypertension;male gender;diabetes mellitus     Objective:    Today's Vitals   01/28/23 1049  Weight: 150 lb (68 kg)  Height: 5' 9 (1.753 m)   Body mass index is 22.15 kg/m.     01/28/2023   10:58 AM 06/24/2022   10:08 AM 06/18/2022   12:10 PM 06/03/2022   10:43 AM 01/24/2022   11:01 AM 09/13/2021    4:19 PM 09/11/2021    2:27 PM  Advanced Directives  Does Patient Have a Medical Advance Directive? Yes  Yes Yes Yes Yes No  Type of Estate Agent of [place];Living will Healthcare Power of [place];Living will Healthcare Power of [place];Living will Healthcare Power of [place];Living will Healthcare Power of [place];Living will Healthcare Power of [place];Living will   Does patient want to make changes to medical advance directive? No - Patient declined No - Patient declined  No - Patient declined No - Patient declined No - Patient declined   Copy of Healthcare Power of Attorney in Chart? Yes - validated most recent copy scanned in chart (See row information) Yes - validated most recent copy scanned in chart (See row information)  No - copy requested Yes - validated most recent copy  scanned in chart (See row information) No - copy requested   Would patient like information on creating a medical advance directive?      No - Patient declined No - Patient declined    Current Medications (verified) Outpatient Encounter Medications as of 01/28/2023  Medication Sig   albuterol  (VENTOLIN  HFA) 108 (90 Base) MCG/ACT inhaler Inhale 2 puffs into the lungs every 6 (six) hours as needed for wheezing or shortness of breath.   amLODipine  (NORVASC ) 5 MG tablet Take 1 tablet (5 mg total) by mouth daily.   aspirin  EC 81 MG tablet Take 81 mg by mouth every morning.   B Complex-C (SUPER B COMPLEX PO) Take 1 tablet by mouth at bedtime.   Flaxseed, Linseed, (FLAXSEED OIL) 1200 MG CAPS Take 1,200 mg by mouth at bedtime.   Fluticasone -Umeclidin-Vilant (TRELEGY ELLIPTA ) 100-62.5-25 MCG/ACT AEPB Inhale 1 puff into the lungs daily. USE 1 INHALATION BY MOUTH INTO  THE LUNGS DAILY AT THE SAME TIME EACH DAY   furosemide  (LASIX ) 20 MG tablet Take 1 tablet (20 mg total) by mouth daily as needed for edema.   Ginkgo Biloba EXTR Take 1 capsule by mouth 2 (two) times daily.   guaiFENesin  (MUCINEX ) 600 MG 12 hr tablet Take 2 tablets (1,200 mg total) by mouth 2 (two) times daily. (Patient taking differently: Take 600 mg by mouth 3 (three) times daily.)   ketoconazole  (NIZORAL ) 2 % cream Apply 1 Application topically daily. Use as needed for fungal infection   ketoconazole  (NIZORAL ) 2 % shampoo Apply 1  Application topically 2 (two) times a week. Use as needed for itchy scalp   loratadine  (CLARITIN ) 10 MG tablet TAKE 1 TABLET EVERY DAY (Patient taking differently: Take 10 mg by mouth every morning.)   meloxicam  (MOBIC ) 15 MG tablet Take 1 tablet (15 mg total) by mouth in the morning.   metFORMIN  (GLUCOPHAGE ) 500 MG tablet Take 0.5 tablets (250 mg total) by mouth daily.   Misc Natural Products (HIMALAYAN GOJI PO) Take 6 Doses by mouth daily. Goji berries   Multiple Vitamin (MULTIVITAMIN WITH MINERALS) TABS tablet  Take 1 tablet by mouth daily with breakfast.   omeprazole  (PRILOSEC) 20 MG capsule Take 1 capsule (20 mg total) by mouth 2 (two) times daily.   OXYGEN  Inhale 2 L/min into the lungs continuous.   pravastatin  (PRAVACHOL ) 40 MG tablet Take 0.5 tablets (20 mg total) by mouth daily.   primidone  (MYSOLINE ) 50 MG tablet Take 1 tablet (50 mg total) by mouth 2 (two) times daily.   SYSTANE COMPLETE PF 0.6 % SOLN Place 1 drop into the right eye in the morning, at noon, and at bedtime.   tadalafil  (CIALIS ) 20 MG tablet TAKE 1/2 TO 1 TAB po 2 HRS PRIOR TO SEXUAL ACTIVITY   terazosin  (HYTRIN ) 1 MG capsule Take 1 capsule (1 mg total) by mouth 2 (two) times daily.   traMADol  (ULTRAM ) 50 MG tablet Take 1 tablet (50 mg total) by mouth every 12 (twelve) hours as needed (for back pain).   triamcinolone  cream (KENALOG ) 0.1 % Apply 1 Application topically 2 (two) times daily as needed (rash/itching).   VITAMIN E PO Take 1 capsule by mouth daily with breakfast.   No facility-administered encounter medications on file as of 01/28/2023.    Allergies (verified) Patient has no known allergies.   History: Past Medical History:  Diagnosis Date   Anal fissure    COPD (chronic obstructive pulmonary disease) (HCC)    Diverticulosis    DM type 2 (diabetes mellitus, type 2) (HCC)    GERD (gastroesophageal reflux disease)    uses prilosec    Hypertension    Pneumonia 2018   Raynaud's disease    Shortness of breath dyspnea    WITH EXERTION   Sinusitis    Tremors of nervous system    Past Surgical History:  Procedure Laterality Date   COLONOSCOPY  03/27/2005   ESOPHAGOGASTRODUODENOSCOPY (EGD) WITH PROPOFOL  N/A 02/08/2021   Procedure: ESOPHAGOGASTRODUODENOSCOPY (EGD) WITH PROPOFOL ;  Surgeon: Teressa Toribio SQUIBB, MD;  Location: [facility];  Service: Endoscopy;  Laterality: N/A;   HERNIA REPAIR     INGUINAL HERNIA REPAIR N/A 05/06/2019   Procedure: LAPAROSCOPIC RIGHT AND LEFT INGUINAL HERNIA(S) REPAIR;  Surgeon: Sheldon Standing, MD;  Location: [facility];  Service: General;  Laterality: N/A;   Family History  Problem Relation Age of Onset   Dementia Brother    Diabetes Brother    Heart attack Father    Dementia Father    Social History   Socioeconomic History   Marital status: Single    Spouse name: Not on file   Number of children: Not on file   Years of education: Not on file   Highest education level: Not on file  Occupational History   Not on file  Tobacco Use   Smoking status: Former    Current packs/day: 0.00    Average packs/day: 1 pack/day for 45.0 years (45.0 ttl pk-yrs)    Types: Cigarettes    Start date: 01/21/1949  Quit date: 01/21/1994    Years since quitting: 29.0   Smokeless tobacco: Never  Vaping Use   Vaping status: Not on file  Substance and Sexual Activity   Alcohol use: Yes    Comment: a drink daily with dinner   Drug use: No   Sexual activity: Not on file  Other Topics Concern   Not on file  Social History Narrative   Not on file   Social Drivers of Health   Financial Resource Strain: Low Risk  (01/28/2023)   Overall Financial Resource Strain (CARDIA)    Difficulty of Paying Living Expenses: Not hard at all  Food Insecurity: No Food Insecurity (01/28/2023)   Hunger Vital Sign    Worried About Running Out of Food in the Last Year: Never true    Ran Out of Food in the Last Year: Never true  Transportation Needs: No Transportation Needs (01/28/2023)   PRAPARE - Administrator, Civil Service (Medical): No    Lack of Transportation (Non-Medical): No  Physical Activity: Inactive (01/28/2023)   Exercise Vital Sign    Days of Exercise per Week: 0 days    Minutes of Exercise per Session: 0 min  Stress: No Stress Concern Present (01/28/2023)   [employer] of Occupational Health - Occupational Stress Questionnaire    Feeling of Stress : Not at all  Social Connections: Moderately Integrated (01/28/2023)   Social Connection and Isolation Panel  [NHANES]    Frequency of Communication with Friends and Family: More than three times a week    Frequency of Social Gatherings with Friends and Family: More than three times a week    Attends Religious Services: More than 4 times per year    Active Member of [employer] or Organizations: Yes    Attends Engineer, Structural: More than 4 times per year    Marital Status: Never married    Tobacco Counseling Counseling given: Not Answered   Clinical Intake:  Pre-visit preparation completed: Yes  Pain : No/denies pain     BMI - recorded: 22.15 Nutritional Status: BMI of 19-24  Normal Nutritional Risks: None Diabetes: No  How often do you need to have someone help you when you read instructions, pamphlets, or other written materials from your doctor or pharmacy?: 1 - Never  Interpreter Needed?: No  Information entered by :: Johnathan Blush LPN   Activities of Daily Living    01/28/2023   10:57 AM 06/24/2022   10:08 AM  In your present state of health, do you have any difficulty performing the following activities:  Hearing? 1 0  Comment Wears Hearing Aids   Vision? 0 0  Difficulty concentrating or making decisions? 0 0  Walking or climbing stairs? 0 0  Dressing or bathing? 0 0  Doing errands, shopping? 0 0  Preparing Food and eating ? N   Using the Toilet? N   In the past six months, have you accidently leaked urine? N   Do you have problems with loss of bowel control? N   Managing your Medications? N   Managing your Finances? N   Housekeeping or managing your Housekeeping? N     Patient Care Team: Copland, Harlene BROCKS, MD as PCP - General (Family Medicine) O'Neal, Darryle Ned, MD as PCP - Cardiology (Cardiology) Sheldon Standing, MD as Consulting Physician (General Surgery)  Indicate any recent Medical Services you may have received from other than [facility] providers in the past year (date may be approximate).  Assessment:   This is a routine wellness examination  for Johnathan [name].  Hearing/Vision screen Hearing Screening - Comments:: Wears Hearing Aids Vision Screening - Comments:: Wears rx glasses - up to date with routine eye exams with  [facility]   Goals Addressed               This Visit's Progress     Increase physical activity (pt-stated)         Depression Screen    01/28/2023   10:56 AM 03/05/2022   11:37 AM 01/24/2022   11:12 AM 01/24/2022   11:11 AM 11/26/2021    3:06 PM 08/21/2021   10:54 AM 04/23/2021    9:32 AM  PHQ 2/9 Scores  PHQ - 2 Score 0 0 0 0 0 0 0    Fall Risk    01/28/2023   10:58 AM 03/05/2022   11:37 AM 01/24/2022   11:04 AM 08/21/2021   10:54 AM 04/23/2021    9:37 AM  Fall Risk   Falls in the past year? 0 0 1 0 0  Number falls in past yr: 0 0 0 0 0  Injury with Fall? 0 0 0 0 0  Risk for fall due to : No Fall Risks Impaired balance/gait No Fall Risks Impaired mobility;Impaired balance/gait Medication side effect  Follow up Falls prevention discussed Falls evaluation completed Falls evaluation completed Education provided Falls evaluation completed;Education provided    MEDICARE RISK AT HOME: Medicare Risk at Home Any stairs in or around the home?: No If so, are there any without handrails?: No Home free of loose throw rugs in walkways, pet beds, electrical cords, etc?: Yes Adequate lighting in your home to reduce risk of falls?: Yes Life alert?: No Use of a cane, walker or w/c?: No Grab bars in the bathroom?: Yes Shower chair or bench in shower?: Yes Elevated toilet seat or a handicapped toilet?: No  TIMED UP AND GO:  Was the test performed?  No    Cognitive Function:        01/28/2023   10:58 AM 01/24/2022   11:24 AM  6CIT Screen  What Year? 0 points 0 points  What month? 0 points 0 points  What time? 0 points 0 points  Count back from 20 0 points 4 points  Months in reverse 0 points 0 points  Repeat phrase 0 points 2 points  Total Score 0 points 6 points    Immunizations Immunization History   Administered Date(s) Administered   Fluad Quad(high Dose 65+) 10/18/2019, 09/27/2021   Fluad Trivalent(High Dose 65+) 11/20/2022   Influenza-Unspecified 10/20/2013, 10/30/2020, 09/29/2021   PFIZER(Purple Top)SARS-COV-2 Vaccination 02/05/2019, 03/08/2019, 10/22/2019   PNEUMOCOCCAL CONJUGATE-20 10/30/2020   Pfizer Covid-19 Vaccine Bivalent Booster 43yrs & up 10/30/2020   Pfizer(Comirnaty )Fall Seasonal Vaccine 12 years and older 11/20/2022   Pneumococcal Conjugate-13 11/25/2019   Pneumococcal Polysaccharide-23 12/27/2007   Respiratory Syncytial Virus Vaccine,Recomb Aduvanted(Arexvy) 09/24/2021   Td 11/27/2007, 07/31/2020   Zoster Recombinant(Shingrix ) 07/31/2020, 01/30/2021   Zoster, Live 12/27/2006    TDAP status: Up to date  Flu Vaccine status: Up to date  Pneumococcal vaccine status: Up to date  Covid-19 vaccine status: Declined, Education has been provided regarding the importance of this vaccine but patient still declined. Advised may receive this vaccine at local pharmacy or Health Dept.or vaccine clinic. Aware to provide a copy of the vaccination record if obtained from local pharmacy or Health Dept. Verbalized acceptance and understanding.  Qualifies for Shingles Vaccine? Yes  Zostavax completed Yes   Shingrix  Completed?: Yes  Screening Tests Health Maintenance  Topic Date Due   OPHTHALMOLOGY EXAM  Never done   COVID-19 Vaccine (6 - 2024-25 season) 01/15/2023   FOOT EXAM  05/06/2023   HEMOGLOBIN A1C  05/21/2023   Medicare Annual Wellness (AWV)  01/28/2024   DTaP/Tdap/Td (3 - Tdap) 08/01/2030   Pneumonia Vaccine 51+ Years old  Completed   INFLUENZA VACCINE  Completed   Zoster Vaccines- Shingrix   Completed   HPV VACCINES  Aged Out    Health Maintenance  Health Maintenance Due  Topic Date Due   OPHTHALMOLOGY EXAM  Never done   COVID-19 Vaccine (6 - 2024-25 season) 01/15/2023       Additional Screening:    Vision Screening: Recommended annual  ophthalmology exams for early detection of glaucoma and other disorders of the eye. Is the patient up to date with their annual eye exam?  Yes  Who is the provider or what is the name of the office in which the patient attends annual eye exams? [facility] If pt is not established with a provider, would they like to be referred to a provider to establish care? No .   Dental Screening: Recommended annual dental exams for proper oral hygiene  Diabetic Foot Exam: Diabetic Foot Exam: Completed 05/06/22  Community Resource Referral / Chronic Care Management:  CRR required this visit?  No   CCM required this visit?  No     Plan:     I have personally reviewed and noted the following in the patient's chart:   Medical and social history Use of alcohol, tobacco or illicit drugs  Current medications and supplements including opioid prescriptions. Patient is currently taking opioid prescriptions. Information provided to patient regarding non-opioid alternatives. Patient advised to discuss non-opioid treatment plan with their provider. Functional ability and status Nutritional status Physical activity Advanced directives List of other physicians Hospitalizations, surgeries, and ER visits in previous 12 months Vitals Screenings to include cognitive, depression, and falls Referrals and appointments  In addition, I have reviewed and discussed with patient certain preventive protocols, quality metrics, and best practice recommendations. A written personalized care plan for preventive services as well as general preventive health recommendations were provided to patient.     Johnathan LELON Blush, LPN   08/22/7972   After Visit Summary: (MyChart) Due to this being a telephonic visit, the after visit summary with patients personalized plan was offered to patient via MyChart   Nurse Notes: None

## 2023-01-31 ENCOUNTER — Telehealth: Payer: Self-pay | Admitting: Family Medicine

## 2023-01-31 NOTE — Telephone Encounter (Signed)
 Copied from CRM [id number]. Topic: Clinical - Medical Advice >> Jan 31, 2023 12:25 PM Taleah C wrote: Reason for CRM: pt called and stated that his insurance needs a referral from Dr. Watt to cover his oxygen  tank and radiologist services. He said there are two charges that he has with his insurance because of this. He would like for someone to notify him when the referrals are made. Please call and advise at [phone].

## 2023-02-03 ENCOUNTER — Other Ambulatory Visit: Payer: Self-pay | Admitting: Urology

## 2023-02-03 DIAGNOSIS — N529 Male erectile dysfunction, unspecified: Secondary | ICD-10-CM

## 2023-02-05 DIAGNOSIS — H905 Unspecified sensorineural hearing loss: Secondary | ICD-10-CM | POA: Diagnosis not present

## 2023-02-19 NOTE — Telephone Encounter (Signed)
These forms have been faxed in.

## 2023-02-22 DIAGNOSIS — U071 COVID-19: Secondary | ICD-10-CM | POA: Diagnosis not present

## 2023-02-26 ENCOUNTER — Telehealth: Payer: Self-pay

## 2023-02-26 NOTE — Telephone Encounter (Signed)
 Copied from CRM [id number]. Topic: Clinical - Medical Advice >> Feb 26, 2023  2:13 PM Corin V wrote: Reason for CRM: Patient called and stated he has a cough starting that he is unsure if it is due to changes in the weather or a possible cold. He also stated his Raynaud's is flaring up in both hands and he would like to know what Dr. Watt would recommend. Agent offered to scheduled patient and he declined and would like to hear back from Dr. Watt first.

## 2023-02-26 NOTE — Telephone Encounter (Signed)
 Called him back- he is trying to come down with a cold, is treating with OTC medication He is also having more raynaud's symptoms in both hands  I offered to bring him into the clinic, right now he does not feel this is necessary.  He notes he has had symptoms of viral infection for a day or 2.  I did encourage him to take a COVID test which he plans to do.  He will let us  know if he needs an office appointment

## 2023-02-27 ENCOUNTER — Ambulatory Visit: Payer: 59 | Admitting: Family Medicine

## 2023-02-27 VITALS — BP 114/80 | HR 77 | Temp 97.8°F | Resp 18 | Ht 69.0 in | Wt 145.8 lb

## 2023-02-27 DIAGNOSIS — Z9981 Dependence on supplemental oxygen: Secondary | ICD-10-CM | POA: Diagnosis not present

## 2023-02-27 DIAGNOSIS — J441 Chronic obstructive pulmonary disease with (acute) exacerbation: Secondary | ICD-10-CM | POA: Diagnosis not present

## 2023-02-27 LAB — POC COVID19 BINAXNOW: SARS Coronavirus 2 Ag: NEGATIVE

## 2023-02-27 MED ORDER — DOXYCYCLINE HYCLATE 100 MG PO CAPS: 100.0000 mg | ORAL_CAPSULE | Freq: Two times a day (BID) | ORAL | 0 refills | Status: DC

## 2023-02-27 MED ORDER — PREDNISONE 20 MG PO TABS: ORAL_TABLET | ORAL | 0 refills | Status: DC

## 2023-02-27 NOTE — Patient Instructions (Signed)
 Good to see you today We will use prednisone  and doxycycline  for a likely COPD exacerbation  Please keep me posted about how you are feeling

## 2023-02-27 NOTE — Progress Notes (Signed)
 [facility] [address], [address] [place], [place] [address] [phone]  Date:  02/27/2023   Name:  Johnathan [name]   DOB:  04/01/1932   MRN:  [id number]  PCP:  Watt Harlene BROCKS, MD    Chief Complaint: URI (Cough has been worsening for the last few days)   History of Present Illness:  Johnathan [name] is a 89 y.o. very pleasant male patient who presents with the following:  Pt seen today with concern of cough. Most recent visit with myself was in December  History of emphysema, diabetes, COPD, hypertension, former smoker. Johnathan [name] had been doing excellently for age until he got COVID in February 2022; he subsequently developed pulmonary fibrosis and is still using oxygen    We spoke on the phone yesterday-  Called him back- he is trying to come down with a cold, is treating with OTC medication He is also having more raynaud's symptoms in both hands  I offered to bring him into the clinic, right now he does not feel this is necessary.  He notes he has had symptoms of viral infection for a day or 2.  I did encourage him to take a COVID test which he plans to do.  He will let us  know if he needs an office appointment  He has noted a cough for 3-4 days- not productive He is using some hycodan syrup he had on hand  He has otherwise not felt especially ill No fever, some body aches- he has chronic right shoulder pain and takes tylenol  most days for it    Patient Active Problem List   Diagnosis Date Noted   Severe sepsis (HCC) 06/18/2022   Hyperlipidemia associated with type 2 diabetes mellitus (HCC) 06/18/2022   Preop pulmonary/respiratory exam 04/09/2022   GERD (gastroesophageal reflux disease) 09/11/2021   Acute on chronic respiratory failure with hypoxia (HCC) 09/10/2021   Interstitial pulmonary disease (HCC) 03/19/2021   Hypertension associated with diabetes (HCC) 03/19/2021   Oral candidiasis 03/05/2021   Chronic respiratory  failure with hypoxia (HCC) 03/05/2021   Trigger finger, right ring finger 02/13/2021   COPD with chronic bronchitis and emphysema (HCC) 04/26/2020   COVID-19 02/25/2020   Shingles 11/14/2019   Raynaud's syndrome 11/14/2019   Skin cancer 11/14/2019   Left inguinal hernia s/p lap repair w mesh 05/06/2019 05/06/2019   Recurrent right inguinal hernia s/p lap repair w mesh 05/06/2019 03/01/2019   Type 2 diabetes mellitus (HCC) 03/01/2019   Community acquired pneumonia of right lower lobe of lung 04/21/2014   COPD with acute exacerbation (HCC) 04/21/2014    Past Medical History:  Diagnosis Date   Anal fissure    COPD (chronic obstructive pulmonary disease) (HCC)    Diverticulosis    DM type 2 (diabetes mellitus, type 2) (HCC)    GERD (gastroesophageal reflux disease)    uses prilosec    Hypertension    Pneumonia 2018   Raynaud's disease    Shortness of breath dyspnea    WITH EXERTION   Sinusitis    Tremors of nervous system     Past Surgical History:  Procedure Laterality Date   COLONOSCOPY  03/27/2005   ESOPHAGOGASTRODUODENOSCOPY (EGD) WITH PROPOFOL  N/A 02/08/2021   Procedure: ESOPHAGOGASTRODUODENOSCOPY (EGD) WITH PROPOFOL ;  Surgeon: Teressa Toribio SQUIBB, MD;  Location: [facility];  Service: Endoscopy;  Laterality: N/A;   HERNIA REPAIR     INGUINAL HERNIA REPAIR N/A 05/06/2019   Procedure: LAPAROSCOPIC  RIGHT AND LEFT INGUINAL HERNIA(S) REPAIR;  Surgeon: Sheldon Standing, MD;  Location:  SURGERY CENTER;  Service: General;  Laterality: N/A;    Social History   Tobacco Use   Smoking status: Former    Current packs/day: 0.00    Average packs/day: 1 pack/day for 45.0 years (45.0 ttl pk-yrs)    Types: Cigarettes    Start date: 01/21/1949    Quit date: 01/21/1994    Years since quitting: 29.1   Smokeless tobacco: Never  Substance Use Topics   Alcohol use: Yes    Comment: a drink daily with dinner   Drug use: No    Family History  Problem Relation Age of Onset   Dementia  Brother    Diabetes Brother    Heart attack Father    Dementia Father     No Known Allergies  Medication list has been reviewed and updated.  Current Outpatient Medications on File Prior to Visit  Medication Sig Dispense Refill   albuterol  (VENTOLIN  HFA) 108 (90 Base) MCG/ACT inhaler Inhale 2 puffs into the lungs every 6 (six) hours as needed for wheezing or shortness of breath. 18 g 2   amLODipine  (NORVASC ) 5 MG tablet Take 1 tablet (5 mg total) by mouth daily. 90 tablet 3   aspirin  EC 81 MG tablet Take 81 mg by mouth every morning.     B Complex-C (SUPER B COMPLEX PO) Take 1 tablet by mouth at bedtime.     Flaxseed, Linseed, (FLAXSEED OIL) 1200 MG CAPS Take 1,200 mg by mouth at bedtime.     Fluticasone -Umeclidin-Vilant (TRELEGY ELLIPTA ) 100-62.5-25 MCG/ACT AEPB Inhale 1 puff into the lungs daily. USE 1 INHALATION BY MOUTH INTO  THE LUNGS DAILY AT THE SAME TIME EACH DAY 60 each 11   furosemide  (LASIX ) 20 MG tablet Take 1 tablet (20 mg total) by mouth daily as needed for edema. 30 tablet 0   Ginkgo Biloba EXTR Take 1 capsule by mouth 2 (two) times daily.     guaiFENesin  (MUCINEX ) 600 MG 12 hr tablet Take 2 tablets (1,200 mg total) by mouth 2 (two) times daily. (Patient taking differently: Take 600 mg by mouth 3 (three) times daily.) 60 tablet 0   ketoconazole  (NIZORAL ) 2 % cream Apply 1 Application topically daily. Use as needed for fungal infection 15 g 1   ketoconazole  (NIZORAL ) 2 % shampoo Apply 1 Application topically 2 (two) times a week. Use as needed for itchy scalp 120 mL PRN   loratadine  (CLARITIN ) 10 MG tablet TAKE 1 TABLET EVERY DAY (Patient taking differently: Take 10 mg by mouth every morning.) 90 tablet 1   meloxicam  (MOBIC ) 15 MG tablet Take 1 tablet (15 mg total) by mouth in the morning. 90 tablet 0   metFORMIN  (GLUCOPHAGE ) 500 MG tablet Take 0.5 tablets (250 mg total) by mouth daily. 45 tablet 3   Misc Natural Products (HIMALAYAN GOJI PO) Take 6 Doses by mouth daily. Goji  berries     Multiple Vitamin (MULTIVITAMIN WITH MINERALS) TABS tablet Take 1 tablet by mouth daily with breakfast.     omeprazole  (PRILOSEC) 20 MG capsule Take 1 capsule (20 mg total) by mouth 2 (two) times daily. 180 capsule 3   OXYGEN  Inhale 2 L/min into the lungs continuous.     pravastatin  (PRAVACHOL ) 40 MG tablet Take 0.5 tablets (20 mg total) by mouth daily. 45 tablet 0   primidone  (MYSOLINE ) 50 MG tablet Take 1 tablet (50 mg total) by mouth 2 (two) times daily.  200 tablet 2   SYSTANE COMPLETE PF 0.6 % SOLN Place 1 drop into the right eye in the morning, at noon, and at bedtime.     tadalafil  (CIALIS ) 20 MG tablet TAKE 1/2 (ONE-HALF) TO 1 TABLET BY MOUTH 2 HOURS PRIOR TO SEXUAL ACTIVITY 15 tablet 0   terazosin  (HYTRIN ) 1 MG capsule Take 1 capsule (1 mg total) by mouth 2 (two) times daily. 180 capsule 0   traMADol  (ULTRAM ) 50 MG tablet Take 1 tablet (50 mg total) by mouth every 12 (twelve) hours as needed (for back pain). 30 tablet 0   triamcinolone  cream (KENALOG ) 0.1 % Apply 1 Application topically 2 (two) times daily as needed (rash/itching).     VITAMIN E PO Take 1 capsule by mouth daily with breakfast.     No current facility-administered medications on file prior to visit.    Review of Systems:  As per HPI- otherwise negative.   Physical Examination: Vitals:   02/27/23 1543  BP: 114/80  Pulse: 77  Resp: 18  Temp: 97.8 F (36.6 C)  SpO2: 92%   Vitals:   02/27/23 1543  Weight: 145 lb 12.8 oz (66.1 kg)  Height: 5' 9 (1.753 m)   Body mass index is 21.53 kg/m. Ideal Body Weight: Weight in (lb) to have BMI = 25: 168.9  GEN: no acute distress.  Normal weight, looks well  HEENT: Atraumatic, Normocephalic.  Ears and Nose: No external deformity. CV: RRR, No M/G/R. No JVD. No thrill. No extra heart sounds. PULM: Mild wheezing bilaterally, no crackles, rhonchi. No retractions. No resp. distress. No accessory muscle use. EXTR: No c/c/e PSYCH: Normally interactive.  Conversant.  He is on 2 L of O2 as per his usual   Results for orders placed or performed in visit on 02/27/23  POC COVID-19 BinaxNow   Collection Time: 02/27/23  4:23 PM  Result Value Ref Range   SARS Coronavirus 2 Ag Negative Negative    Assessment and Plan: COPD with acute exacerbation (HCC) - Plan: POC COVID-19 BinaxNow, doxycycline  (VIBRAMYCIN ) 100 MG capsule, predniSONE  (DELTASONE ) 20 MG tablet  Oxygen  dependent  Patient seen today with COPD exacerbation.  Will treat with doxycycline  and prednisone  Diabetes has been under good control, most recent A1c 6.6% I have asked him to let me know if he is not feeling better in the next few days  Signed Harlene Schroeder, MD

## 2023-03-03 ENCOUNTER — Other Ambulatory Visit: Payer: Self-pay | Admitting: Family Medicine

## 2023-03-03 MED ORDER — PRAVASTATIN SODIUM 40 MG PO TABS: 20.0000 mg | ORAL_TABLET | Freq: Every day | ORAL | 0 refills | Status: DC

## 2023-03-03 MED ORDER — TERAZOSIN HCL 1 MG PO CAPS: 1.0000 mg | ORAL_CAPSULE | Freq: Two times a day (BID) | ORAL | 0 refills | Status: DC

## 2023-03-03 MED ORDER — MELOXICAM 15 MG PO TABS: 15.0000 mg | ORAL_TABLET | Freq: Every morning | ORAL | 0 refills | Status: DC

## 2023-03-03 NOTE — Telephone Encounter (Signed)
 Copied from CRM [id number]. Topic: Clinical - Medication Refill >> Mar 03, 2023  9:51 AM [place] J wrote: Most Recent Primary Care Visit:  Provider: Gates Kasal C  Department: [facility]  Visit Type: OFFICE VISIT  Date: 02/27/2023  Medication:  pravastatin  (PRAVACHOL ) 40 MG tablet  meloxicam  (MOBIC ) 15 MG tablet terazosin  (HYTRIN ) 1 MG capsule  Has the patient contacted their pharmacy? Yes (Agent: If no, request that the patient contact the pharmacy for the refill. If patient does not wish to contact the pharmacy document the reason why and proceed with request.) (Agent: If yes, when and what did the pharmacy advise?)  Is this the correct pharmacy for this prescription? Yes If no, delete pharmacy and type the correct one.  This is the patient's preferred pharmacy:  [facility] [address], [place] - [address] [place] [address] Phone: [phone] Fax: [phone]  [facility] - [place], [place] - [address] [place] [address] Phone: [phone] Fax: [phone]   Has the prescription been filled recently? No  Is the patient out of the medication? Yes  Has the patient been seen for an appointment in the last year OR does the patient have an upcoming appointment? Yes  Can we respond through MyChart? Yes  Agent: Please be advised that Rx refills may take up to 3 business days. We ask that you follow-up with your pharmacy.

## 2023-03-04 ENCOUNTER — Telehealth: Payer: Self-pay

## 2023-03-04 NOTE — Telephone Encounter (Signed)
Copied from CRM [id number]. Topic: General - Other >> Mar 04, 2023  2:33 PM Florestine Avers wrote: Reason for CRM: Patient called in asking to go over his visit summary, it said he had a virus but did not explain what it was. He is requesting a call back.

## 2023-03-11 ENCOUNTER — Telehealth: Payer: Self-pay | Admitting: Family Medicine

## 2023-03-11 NOTE — Telephone Encounter (Signed)
Called pt back. He declined the VV because he thinks he is on the mend. He has completed the Doxy and Prednisone. He will leave things open ended and call back should he start to worsen, unless you think he needs to rtn or start another RX.

## 2023-03-11 NOTE — Telephone Encounter (Signed)
Called pt to advise we will be closed tmr and offered a vv. Pt declined and also did not want to wait till Monday. States he will try to fight off the virus and will call us if needed. Advised to seek UC if sxs worsen.

## 2023-03-12 ENCOUNTER — Ambulatory Visit: Payer: 59 | Admitting: Family Medicine

## 2023-03-18 ENCOUNTER — Ambulatory Visit: Payer: Self-pay | Admitting: Family Medicine

## 2023-03-18 NOTE — Telephone Encounter (Signed)
 Copied from CRM [id number]. Topic: Clinical - Red Word Triage >> Mar 18, 2023  3:42 PM Shelbie Proctor wrote: Red Word that prompted transfer to Nurse Triage: Patient [phone] cough is getting worse, hard to talk and hard time to get a breath, scared that he may not be alive because of it. Wants to be seen in person with Dr. Patsy Lager again, was just seen 02/27/23. Please advise. Reason for Disposition  [1] Continuous (nonstop) coughing interferes with work or school AND [2] no improvement using cough treatment per Care Advice  Answer Assessment - Initial Assessment Questions 1. ONSET: "When did the cough begin?"      Coughing was going on before I saw Dr. Dallas Schimke last time.   I have virus and was put on prednisone.   It didn't help.   I'm worse than I was then.   2. SEVERITY: "How bad is the cough today?"      It's bad and not clearing up. 3. SPUTUM: "Describe the color of your sputum" (none, dry cough; clear, white, yellow, green)     Rarely coughs up something 4. HEMOPTYSIS: "Are you coughing up any blood?" If so ask: "How much?" (flecks, streaks, tablespoons, etc.)     Not asked 5. DIFFICULTY BREATHING: "Are you having difficulty breathing?" If Yes, ask: "How bad is it?" (e.g., mild, moderate, severe)    - MILD: No SOB at rest, mild SOB with walking, speaks normally in sentences, can lie down, no retractions, pulse < 100.    - MODERATE: SOB at rest, SOB with minimal exertion and prefers to sit, cannot lie down flat, speaks in phrases, mild retractions, audible wheezing, pulse 100-120.    - SEVERE: Very SOB at rest, speaks in single words, struggling to breathe, sitting hunched forward, retractions, pulse > 120      Moderate 6. FEVER: "Do you have a fever?" If Yes, ask: "What is your temperature, how was it measured, and when did it start?"     No 7. CARDIAC HISTORY: "Do you have any history of heart disease?" (e.g., heart attack, congestive heart failure)      Not asked 8. LUNG HISTORY: "Do you  have any history of lung disease?"  (e.g., pulmonary embolus, asthma, emphysema)     Not asked 9. PE RISK FACTORS: "Do you have a history of blood clots?" (or: recent major surgery, recent prolonged travel, bedridden)     Not asked 10. OTHER SYMPTOMS: "Do you have any other symptoms?" (e.g., runny nose, wheezing, chest pain)       No sore throat or runny nose.   11. PREGNANCY: "Is there any chance you are pregnant?" "When was your last menstrual period?"       N/A 12. TRAVEL: "Have you traveled out of the country in the last month?" (e.g., travel history, exposures)       N/A  Protocols used: Cough - Acute Non-Productive-A-AH  Chief Complaint:  non productive coughing is not going away Symptoms: Shortness of breath too Frequency: Since before he was seen by Dr. Dallas Schimke.  Pertinent Negatives: Patient denies fever, sore throat or other symptoms. Disposition: [] ED /[] Urgent Care (no appt availability in office) / [x] Appointment(In office/virtual)/ []  [facility]/ [] Home Care/ [] Refused Recommended Disposition /[] [facility]/ []  Follow-up with PCP Additional Notes: Appt made for 03/19/2023

## 2023-03-18 NOTE — Telephone Encounter (Signed)
 FYI: pt has appt with Ladona Ridgel

## 2023-03-19 ENCOUNTER — Ambulatory Visit (INDEPENDENT_AMBULATORY_CARE_PROVIDER_SITE_OTHER): Payer: 59 | Admitting: Family Medicine

## 2023-03-19 ENCOUNTER — Encounter: Payer: Self-pay | Admitting: Family Medicine

## 2023-03-19 VITALS — BP 125/65 | HR 84 | Ht 69.0 in | Wt 147.0 lb

## 2023-03-19 DIAGNOSIS — R058 Other specified cough: Secondary | ICD-10-CM

## 2023-03-19 MED ORDER — CETIRIZINE HCL 5 MG PO TABS: 5.0000 mg | ORAL_TABLET | Freq: Every day | ORAL | 1 refills | Status: DC

## 2023-03-19 MED ORDER — BENZONATATE 100 MG PO CAPS: 100.0000 mg | ORAL_CAPSULE | Freq: Two times a day (BID) | ORAL | 0 refills | Status: DC | PRN

## 2023-03-19 NOTE — Patient Instructions (Signed)
 Possible post-infectious cough syndrome vs cyclical cough related to drainage vs COPD: - Continue Mucinex - Change loratadine to cetrizine nightly - Adding as needed twice daily Tessalon for cough - Sip on water frequently - Try to avoid clearing your throat - Monitor for any new or worsening symptoms and follow-up as needed

## 2023-03-19 NOTE — Progress Notes (Signed)
 Acute Office Visit  Subjective:     Patient ID: Johnathan [name], male    DOB: 1932/10/07, 89 y.o.   MRN: [id number]  Chief Complaint  Patient presents with   Cough     Patient is in today for cough.  Discussed the use of AI scribe software for clinical note transcription with the patient, who gave verbal consent to proceed.  History of Present Illness Johnathan [name] is an 89 year old male with COPD who presents with persistent cough following a recent COPD flare-up. He was seen by Dr. Dallas Schimke approximately two weeks ago for treatment of the flare-up.  He has a persistent cough that is described as 'uncontrollable at times' and is his primary concern. He experiences several severe coughing fits daily, though not continuously. The cough is mostly dry, with occasional greenish-brown sputum every two to three weeks. No recent sputum production, drainage, reflux, or heartburn.  For COPD management, he uses a Trelegy inhaler once nightly and carries an albuterol inhaler for occasional use, less than once a week. He is on continuous oxygen therapy at two liters, which he generally does not adjust. He also takes Mucinex twice daily and loratadine regularly.  He experiences shortness of breath with minimal exertion, such as walking up a slight hill, and notes a significant lack of energy. He occasionally experiences back pain, for which he takes Tylenol 500 mg as needed.  He mentions not drinking enough water, although he consumes other liquids like coffee and rum and Coke. He lives with a roommate and uses a long oxygen hose at home, which requires awareness to avoid tripping hazards.         All review of systems negative except what is listed in the HPI      Objective:    BP 125/65   Pulse 84   Ht 5\' 9"  (1.753 m)   Wt 147 lb (66.7 kg)   SpO2 91%   BMI 21.71 kg/m    Physical Exam Vitals reviewed.  Constitutional:      General: He is not in acute distress.    Appearance:  Normal appearance. He is not ill-appearing.  HENT:     Head: Normocephalic and atraumatic.     Mouth/Throat:     Mouth: Mucous membranes are moist.     Comments: Mild postnasal drainage Cardiovascular:     Rate and Rhythm: Normal rate and regular rhythm.  Pulmonary:     Effort: Pulmonary effort is normal.     Breath sounds: Normal breath sounds. No wheezing, rhonchi or rales.     Comments: Mildly diminished  Skin:    General: Skin is warm and dry.  Neurological:     Mental Status: He is alert and oriented to person, place, and time.  Psychiatric:        Mood and Affect: Mood normal.        Behavior: Behavior normal.        Thought Content: Thought content normal.        Judgment: Judgment normal.        No results found for any visits on 03/19/23.      Assessment & Plan:   Problem List Items Addressed This Visit   None Visit Diagnoses       Post-viral cough syndrome    -  Primary   Relevant Medications   cetirizine (ZYRTEC) 5 MG tablet   benzonatate (TESSALON) 100 MG capsule       Assessment &  Plan  Recently completed course of prednisone and doxycycline. Currently experiencing intermittent coughing fits, but no significant sputum production. No current dyspnea except during exertion. On Trelegy inhaler nightly and albuterol PRN (used infrequently).  Possible post-infectious cough syndrome vs cyclical cough related to drainage vs COPD: - Continue Mucinex - Change loratadine to cetrizine nightly - Adding as needed twice daily Tessalon for cough - Sip on water frequently - Try to avoid clearing your throat - Monitor for any new or worsening symptoms and follow-up as needed      Meds ordered this encounter  Medications   cetirizine (ZYRTEC) 5 MG tablet    Sig: Take 1 tablet (5 mg total) by mouth at bedtime.    Dispense:  90 tablet    Refill:  1    Supervising Provider:   Danise Edge A [[id number]]   benzonatate (TESSALON) 100 MG capsule    Sig: Take 1  capsule (100 mg total) by mouth 2 (two) times daily as needed for cough.    Dispense:  20 capsule    Refill:  0    Supervising Provider:   Danise Edge A [[id number]]    Return if symptoms worsen or fail to improve.  Clayborne Dana, NP

## 2023-03-22 DIAGNOSIS — U071 COVID-19: Secondary | ICD-10-CM | POA: Diagnosis not present

## 2023-03-28 NOTE — Progress Notes (Deleted)
 [facility] [address], [address] [place], [place] [address] [phone]  Date:  04/02/2023   Name:  Johnathan [name]   DOB:  1932-04-15   MRN:  [id number]  PCP:  Pearline Cables, MD    Chief Complaint: No chief complaint on file.   History of Present Illness:  Johnathan [name] is a 89 y.o. very pleasant male patient who presents with the following:  Johnathan [name] is seen today for periodic follow-up Most recent visit with myself was in February for a COPD exacerbation which we treated with doxycycline and prednisone  History of emphysema, diabetes, COPD, hypertension, former smoker. Johnathan [name] had been doing excellently for age until he got COVID in February 2022; he subsequently developed pulmonary fibrosis and is still using oxygen    He saw my partner Hyman Hopes NP in late February with a postviral cough, she treated with Zyrtec and Tessalon  Junaid is up-to-date on shingles, flu, COVID, RSV Blood work done in October Patient Active Problem List   Diagnosis Date Noted   Severe sepsis (HCC) 06/18/2022   Hyperlipidemia associated with type 2 diabetes mellitus (HCC) 06/18/2022   Preop pulmonary/respiratory exam 04/09/2022   GERD (gastroesophageal reflux disease) 09/11/2021   Acute on chronic respiratory failure with hypoxia (HCC) 09/10/2021   Interstitial pulmonary disease (HCC) 03/19/2021   Hypertension associated with diabetes (HCC) 03/19/2021   Oral candidiasis 03/05/2021   Chronic respiratory failure with hypoxia (HCC) 03/05/2021   Trigger finger, right ring finger 02/13/2021   COPD with chronic bronchitis and emphysema (HCC) 04/26/2020   COVID-19 02/25/2020   Shingles 11/14/2019   Raynaud's syndrome 11/14/2019   Skin cancer 11/14/2019   Left inguinal hernia s/p lap repair w mesh 05/06/2019 05/06/2019   Recurrent right inguinal hernia s/p lap repair w mesh 05/06/2019 03/01/2019   Type 2 diabetes mellitus (HCC) 03/01/2019   Community  acquired pneumonia of right lower lobe of lung 04/21/2014   COPD with acute exacerbation (HCC) 04/21/2014    Past Medical History:  Diagnosis Date   Anal fissure    COPD (chronic obstructive pulmonary disease) (HCC)    Diverticulosis    DM type 2 (diabetes mellitus, type 2) (HCC)    GERD (gastroesophageal reflux disease)    uses prilosec    Hypertension    Pneumonia 2018   Raynaud's disease    Shortness of breath dyspnea    WITH EXERTION   Sinusitis    Tremors of nervous system     Past Surgical History:  Procedure Laterality Date   COLONOSCOPY  03/27/2005   ESOPHAGOGASTRODUODENOSCOPY (EGD) WITH PROPOFOL N/A 02/08/2021   Procedure: ESOPHAGOGASTRODUODENOSCOPY (EGD) WITH PROPOFOL;  Surgeon: Rachael Fee, MD;  Location: [facility];  Service: Endoscopy;  Laterality: N/A;   HERNIA REPAIR     INGUINAL HERNIA REPAIR N/A 05/06/2019   Procedure: LAPAROSCOPIC RIGHT AND LEFT INGUINAL HERNIA(S) REPAIR;  Surgeon: Karie Soda, MD;  Location: [facility];  Service: General;  Laterality: N/A;    Social History   Tobacco Use   Smoking status: Former    Current packs/day: 0.00    Average packs/day: 1 pack/day for 45.0 years (45.0 ttl pk-yrs)    Types: Cigarettes    Start date: 01/21/1949    Quit date: 01/21/1994    Years since quitting: 29.2   Smokeless tobacco: Never  Substance Use Topics   Alcohol use: Yes    Comment: a drink daily with dinner  Drug use: No    Family History  Problem Relation Age of Onset   Dementia Brother    Diabetes Brother    Heart attack Father    Dementia Father     No Known Allergies  Medication list has been reviewed and updated.  Current Outpatient Medications on File Prior to Visit  Medication Sig Dispense Refill   albuterol (VENTOLIN HFA) 108 (90 Base) MCG/ACT inhaler Inhale 2 puffs into the lungs every 6 (six) hours as needed for wheezing or shortness of breath. 18 g 2   amLODipine (NORVASC) 5 MG tablet Take 1 tablet (5 mg  total) by mouth daily. 90 tablet 3   aspirin EC 81 MG tablet Take 81 mg by mouth every morning.     B Complex-C (SUPER B COMPLEX PO) Take 1 tablet by mouth at bedtime.     benzonatate (TESSALON) 100 MG capsule Take 1 capsule (100 mg total) by mouth 2 (two) times daily as needed for cough. 20 capsule 0   cetirizine (ZYRTEC) 5 MG tablet Take 1 tablet (5 mg total) by mouth at bedtime. 90 tablet 1   Flaxseed, Linseed, (FLAXSEED OIL) 1200 MG CAPS Take 1,200 mg by mouth at bedtime.     Fluticasone-Umeclidin-Vilant (TRELEGY ELLIPTA) 100-62.5-25 MCG/ACT AEPB Inhale 1 puff into the lungs daily. USE 1 INHALATION BY MOUTH INTO  THE LUNGS DAILY AT THE SAME TIME EACH DAY 60 each 11   furosemide (LASIX) 20 MG tablet Take 1 tablet (20 mg total) by mouth daily as needed for edema. 30 tablet 0   Ginkgo Biloba EXTR Take 1 capsule by mouth 2 (two) times daily.     guaiFENesin (MUCINEX) 600 MG 12 hr tablet Take 2 tablets (1,200 mg total) by mouth 2 (two) times daily. (Patient taking differently: Take 600 mg by mouth 3 (three) times daily.) 60 tablet 0   ketoconazole (NIZORAL) 2 % cream Apply 1 Application topically daily. Use as needed for fungal infection 15 g 1   ketoconazole (NIZORAL) 2 % shampoo Apply 1 Application topically 2 (two) times a week. Use as needed for itchy scalp 120 mL PRN   meloxicam (MOBIC) 15 MG tablet Take 1 tablet (15 mg total) by mouth in the morning. 90 tablet 0   metFORMIN (GLUCOPHAGE) 500 MG tablet Take 0.5 tablets (250 mg total) by mouth daily. 45 tablet 3   Misc Natural Products (HIMALAYAN GOJI PO) Take 6 Doses by mouth daily. Goji berries     Multiple Vitamin (MULTIVITAMIN WITH MINERALS) TABS tablet Take 1 tablet by mouth daily with breakfast.     omeprazole (PRILOSEC) 20 MG capsule Take 1 capsule (20 mg total) by mouth 2 (two) times daily. 180 capsule 3   OXYGEN Inhale 2 L/min into the lungs continuous.     pravastatin (PRAVACHOL) 40 MG tablet Take 0.5 tablets (20 mg total) by mouth  daily. 45 tablet 0   primidone (MYSOLINE) 50 MG tablet Take 1 tablet (50 mg total) by mouth 2 (two) times daily. 200 tablet 2   SYSTANE COMPLETE PF 0.6 % SOLN Place 1 drop into the right eye in the morning, at noon, and at bedtime.     tadalafil (CIALIS) 20 MG tablet TAKE 1/2 (ONE-HALF) TO 1 TABLET BY MOUTH 2 HOURS PRIOR TO SEXUAL ACTIVITY 15 tablet 0   terazosin (HYTRIN) 1 MG capsule Take 1 capsule (1 mg total) by mouth 2 (two) times daily. 180 capsule 0   traMADol (ULTRAM) 50 MG tablet Take 1 tablet (  50 mg total) by mouth every 12 (twelve) hours as needed (for back pain). 30 tablet 0   triamcinolone cream (KENALOG) 0.1 % Apply 1 Application topically 2 (two) times daily as needed (rash/itching).     VITAMIN E PO Take 1 capsule by mouth daily with breakfast.     No current facility-administered medications on file prior to visit.    Review of Systems:  As per HPI- otherwise negative.   Physical Examination: There were no vitals filed for this visit. There were no vitals filed for this visit. There is no height or weight on file to calculate BMI. Ideal Body Weight:    GEN: no acute distress. HEENT: Atraumatic, Normocephalic.  Ears and Nose: No external deformity. CV: RRR, No M/G/R. No JVD. No thrill. No extra heart sounds. PULM: CTA B, no wheezes, crackles, rhonchi. No retractions. No resp. distress. No accessory muscle use. ABD: S, NT, ND, +BS. No rebound. No HSM. EXTR: No c/c/e PSYCH: Normally interactive. Conversant.    Assessment and Plan: ***  Signed Abbe Amsterdam, MD

## 2023-04-02 ENCOUNTER — Ambulatory Visit: Payer: Medicare HMO | Admitting: Family Medicine

## 2023-04-02 DIAGNOSIS — E119 Type 2 diabetes mellitus without complications: Secondary | ICD-10-CM

## 2023-04-02 DIAGNOSIS — Z9981 Dependence on supplemental oxygen: Secondary | ICD-10-CM

## 2023-04-02 DIAGNOSIS — I10 Essential (primary) hypertension: Secondary | ICD-10-CM

## 2023-04-08 DIAGNOSIS — I10 Essential (primary) hypertension: Secondary | ICD-10-CM | POA: Diagnosis not present

## 2023-04-08 DIAGNOSIS — R000 Tachycardia, unspecified: Secondary | ICD-10-CM | POA: Diagnosis not present

## 2023-04-08 DIAGNOSIS — J449 Chronic obstructive pulmonary disease, unspecified: Secondary | ICD-10-CM | POA: Diagnosis not present

## 2023-04-08 DIAGNOSIS — R0902 Hypoxemia: Secondary | ICD-10-CM | POA: Diagnosis not present

## 2023-04-22 DIAGNOSIS — U071 COVID-19: Secondary | ICD-10-CM | POA: Diagnosis not present

## 2023-04-23 NOTE — Progress Notes (Signed)
 Pt did not show up- unlike him No answer at home I called his roommate who saw his this am, doing fine.  He likely just forgot

## 2023-04-23 NOTE — Patient Instructions (Signed)
It was good to see you again today!   

## 2023-04-28 ENCOUNTER — Ambulatory Visit (INDEPENDENT_AMBULATORY_CARE_PROVIDER_SITE_OTHER): Payer: Self-pay | Admitting: Family Medicine

## 2023-04-28 DIAGNOSIS — I10 Essential (primary) hypertension: Secondary | ICD-10-CM

## 2023-04-28 DIAGNOSIS — Z9981 Dependence on supplemental oxygen: Secondary | ICD-10-CM

## 2023-04-28 DIAGNOSIS — E119 Type 2 diabetes mellitus without complications: Secondary | ICD-10-CM

## 2023-04-28 DIAGNOSIS — Z91199 Patient's noncompliance with other medical treatment and regimen due to unspecified reason: Secondary | ICD-10-CM

## 2023-04-28 DIAGNOSIS — E1169 Type 2 diabetes mellitus with other specified complication: Secondary | ICD-10-CM

## 2023-04-28 DIAGNOSIS — J441 Chronic obstructive pulmonary disease with (acute) exacerbation: Secondary | ICD-10-CM

## 2023-04-29 NOTE — Progress Notes (Unsigned)
 [facility] [address] [place], [place] [address] [phone]  Date:  04/30/2023   Name:  Johnathan [name]   DOB:  Sep 14, 1932   MRN:  [id number]  PCP:  Pearline Cables, MD    Chief Complaint: No chief complaint on file.   History of Present Illness:  NAS WAFER is a 89 y.o. very pleasant male patient who presents with the following:  Pt seen today for follow-up Last seen by myself in February  History of emphysema, diabetes, COPD, hypertension, former smoker. Tremain had been doing excellently for age until he got COVID in February 2022; he subsequently developed pulmonary fibrosis and is still using oxygen   Patient Active Problem List   Diagnosis Date Noted   Severe sepsis (HCC) 06/18/2022   Hyperlipidemia associated with type 2 diabetes mellitus (HCC) 06/18/2022   Preop pulmonary/respiratory exam 04/09/2022   GERD (gastroesophageal reflux disease) 09/11/2021   Acute on chronic respiratory failure with hypoxia (HCC) 09/10/2021   Interstitial pulmonary disease (HCC) 03/19/2021   Hypertension associated with diabetes (HCC) 03/19/2021   Oral candidiasis 03/05/2021   Chronic respiratory failure with hypoxia (HCC) 03/05/2021   Trigger finger, right ring finger 02/13/2021   COPD with chronic bronchitis and emphysema (HCC) 04/26/2020   COVID-19 02/25/2020   Shingles 11/14/2019   Raynaud's syndrome 11/14/2019   Skin cancer 11/14/2019   Left inguinal hernia s/p lap repair w mesh 05/06/2019 05/06/2019   Recurrent right inguinal hernia s/p lap repair w mesh 05/06/2019 03/01/2019   Type 2 diabetes mellitus (HCC) 03/01/2019   Community acquired pneumonia of right lower lobe of lung 04/21/2014    Past Medical History:  Diagnosis Date   Anal fissure    COPD (chronic obstructive pulmonary disease) (HCC)    Diverticulosis    DM type 2 (diabetes mellitus, type 2) (HCC)    GERD (gastroesophageal reflux disease)    uses  prilosec    Hypertension    Pneumonia 2018   Raynaud's disease    Shortness of breath dyspnea    WITH EXERTION   Sinusitis    Tremors of nervous system     Past Surgical History:  Procedure Laterality Date   COLONOSCOPY  03/27/2005   ESOPHAGOGASTRODUODENOSCOPY (EGD) WITH PROPOFOL N/A 02/08/2021   Procedure: ESOPHAGOGASTRODUODENOSCOPY (EGD) WITH PROPOFOL;  Surgeon: Rachael Fee, MD;  Location: [facility];  Service: Endoscopy;  Laterality: N/A;   HERNIA REPAIR     INGUINAL HERNIA REPAIR N/A 05/06/2019   Procedure: LAPAROSCOPIC RIGHT AND LEFT INGUINAL HERNIA(S) REPAIR;  Surgeon: Karie Soda, MD;  Location: [facility];  Service: General;  Laterality: N/A;    Social History   Tobacco Use   Smoking status: Former    Current packs/day: 0.00    Average packs/day: 1 pack/day for 45.0 years (45.0 ttl pk-yrs)    Types: Cigarettes    Start date: 01/21/1949    Quit date: 01/21/1994    Years since quitting: 29.2   Smokeless tobacco: Never  Substance Use Topics   Alcohol use: Yes    Comment: a drink daily with dinner   Drug use: No    Family History  Problem Relation Age of Onset   Dementia Brother    Diabetes Brother    Heart attack Father    Dementia Father     No Known Allergies  Medication list has been reviewed and updated.  Current Outpatient Medications on File Prior  to Visit  Medication Sig Dispense Refill   albuterol (VENTOLIN HFA) 108 (90 Base) MCG/ACT inhaler Inhale 2 puffs into the lungs every 6 (six) hours as needed for wheezing or shortness of breath. 18 g 2   amLODipine (NORVASC) 5 MG tablet Take 1 tablet (5 mg total) by mouth daily. 90 tablet 3   aspirin EC 81 MG tablet Take 81 mg by mouth every morning.     B Complex-C (SUPER B COMPLEX PO) Take 1 tablet by mouth at bedtime.     benzonatate (TESSALON) 100 MG capsule Take 1 capsule (100 mg total) by mouth 2 (two) times daily as needed for cough. 20 capsule 0   cetirizine (ZYRTEC) 5 MG tablet  Take 1 tablet (5 mg total) by mouth at bedtime. 90 tablet 1   Flaxseed, Linseed, (FLAXSEED OIL) 1200 MG CAPS Take 1,200 mg by mouth at bedtime.     Fluticasone-Umeclidin-Vilant (TRELEGY ELLIPTA) 100-62.5-25 MCG/ACT AEPB Inhale 1 puff into the lungs daily. USE 1 INHALATION BY MOUTH INTO  THE LUNGS DAILY AT THE SAME TIME EACH DAY 60 each 11   furosemide (LASIX) 20 MG tablet Take 1 tablet (20 mg total) by mouth daily as needed for edema. 30 tablet 0   Ginkgo Biloba EXTR Take 1 capsule by mouth 2 (two) times daily.     guaiFENesin (MUCINEX) 600 MG 12 hr tablet Take 2 tablets (1,200 mg total) by mouth 2 (two) times daily. (Patient taking differently: Take 600 mg by mouth 3 (three) times daily.) 60 tablet 0   ketoconazole (NIZORAL) 2 % cream Apply 1 Application topically daily. Use as needed for fungal infection 15 g 1   ketoconazole (NIZORAL) 2 % shampoo Apply 1 Application topically 2 (two) times a week. Use as needed for itchy scalp 120 mL PRN   meloxicam (MOBIC) 15 MG tablet Take 1 tablet (15 mg total) by mouth in the morning. 90 tablet 0   metFORMIN (GLUCOPHAGE) 500 MG tablet Take 0.5 tablets (250 mg total) by mouth daily. 45 tablet 3   Misc Natural Products (HIMALAYAN GOJI PO) Take 6 Doses by mouth daily. Goji berries     Multiple Vitamin (MULTIVITAMIN WITH MINERALS) TABS tablet Take 1 tablet by mouth daily with breakfast.     omeprazole (PRILOSEC) 20 MG capsule Take 1 capsule (20 mg total) by mouth 2 (two) times daily. 180 capsule 3   OXYGEN Inhale 2 L/min into the lungs continuous.     pravastatin (PRAVACHOL) 40 MG tablet Take 0.5 tablets (20 mg total) by mouth daily. 45 tablet 0   primidone (MYSOLINE) 50 MG tablet Take 1 tablet (50 mg total) by mouth 2 (two) times daily. 200 tablet 2   SYSTANE COMPLETE PF 0.6 % SOLN Place 1 drop into the right eye in the morning, at noon, and at bedtime.     tadalafil (CIALIS) 20 MG tablet TAKE 1/2 (ONE-HALF) TO 1 TABLET BY MOUTH 2 HOURS PRIOR TO SEXUAL ACTIVITY  15 tablet 0   terazosin (HYTRIN) 1 MG capsule Take 1 capsule (1 mg total) by mouth 2 (two) times daily. 180 capsule 0   traMADol (ULTRAM) 50 MG tablet Take 1 tablet (50 mg total) by mouth every 12 (twelve) hours as needed (for back pain). 30 tablet 0   triamcinolone cream (KENALOG) 0.1 % Apply 1 Application topically 2 (two) times daily as needed (rash/itching).     VITAMIN E PO Take 1 capsule by mouth daily with breakfast.     No current  facility-administered medications on file prior to visit.    Review of Systems:  As per HPI- otherwise negative.   Physical Examination: There were no vitals filed for this visit. There were no vitals filed for this visit. There is no height or weight on file to calculate BMI. Ideal Body Weight:    GEN: no acute distress. HEENT: Atraumatic, Normocephalic.  Ears and Nose: No external deformity. CV: RRR, No M/G/R. No JVD. No thrill. No extra heart sounds. PULM: CTA B, no wheezes, crackles, rhonchi. No retractions. No resp. distress. No accessory muscle use. ABD: S, NT, ND, +BS. No rebound. No HSM. EXTR: No c/c/e PSYCH: Normally interactive. Conversant.    Assessment and Plan: ***  Signed Abbe Amsterdam, MD

## 2023-04-30 ENCOUNTER — Ambulatory Visit (INDEPENDENT_AMBULATORY_CARE_PROVIDER_SITE_OTHER): Admitting: Family Medicine

## 2023-04-30 VITALS — BP 122/82 | HR 76 | Temp 97.6°F | Resp 18 | Ht 69.0 in | Wt 149.0 lb

## 2023-04-30 DIAGNOSIS — I10 Essential (primary) hypertension: Secondary | ICD-10-CM | POA: Diagnosis not present

## 2023-04-30 DIAGNOSIS — G8929 Other chronic pain: Secondary | ICD-10-CM | POA: Diagnosis not present

## 2023-04-30 DIAGNOSIS — R5383 Other fatigue: Secondary | ICD-10-CM

## 2023-04-30 DIAGNOSIS — E119 Type 2 diabetes mellitus without complications: Secondary | ICD-10-CM

## 2023-04-30 DIAGNOSIS — M545 Low back pain, unspecified: Secondary | ICD-10-CM | POA: Diagnosis not present

## 2023-04-30 DIAGNOSIS — Z9981 Dependence on supplemental oxygen: Secondary | ICD-10-CM | POA: Diagnosis not present

## 2023-05-01 ENCOUNTER — Encounter: Payer: Self-pay | Admitting: Family Medicine

## 2023-05-01 LAB — BASIC METABOLIC PANEL WITH GFR
BUN: 24 mg/dL — ABNORMAL HIGH (ref 6–23)
CO2: 28 meq/L (ref 19–32)
Calcium: 9.4 mg/dL (ref 8.4–10.5)
Chloride: 102 meq/L (ref 96–112)
Creatinine, Ser: 1.2 mg/dL (ref 0.40–1.50)
GFR: 53.98 mL/min — ABNORMAL LOW (ref >60.00)
Glucose, Bld: 94 mg/dL (ref 70–99)
Potassium: 4.8 meq/L (ref 3.5–5.1)
Sodium: 139 meq/L (ref 135–145)

## 2023-05-01 LAB — CBC
HCT: 38.2 % — ABNORMAL LOW (ref 39.0–52.0)
Hemoglobin: 12.8 g/dL — ABNORMAL LOW (ref 13.0–17.0)
MCHC: 33.4 g/dL (ref 30.0–36.0)
MCV: 97.1 fl (ref 78.0–100.0)
Platelets: 177 10*3/uL (ref 150.0–400.0)
RBC: 3.94 Mil/uL — ABNORMAL LOW (ref 4.22–5.81)
RDW: 14.3 % (ref 11.5–15.5)
WBC: 5.5 10*3/uL (ref 4.0–10.5)

## 2023-05-01 LAB — VITAMIN B12: Vitamin B-12: 642 pg/mL (ref 211–911)

## 2023-05-01 LAB — VITAMIN D 25 HYDROXY (VIT D DEFICIENCY, FRACTURES): VITD: 43.67 ng/mL (ref 30.00–100.00)

## 2023-05-01 LAB — HEMOGLOBIN A1C: Hgb A1c MFr Bld: 6.4 % (ref 4.6–6.5)

## 2023-05-12 ENCOUNTER — Telehealth: Payer: Self-pay | Admitting: Family Medicine

## 2023-05-12 NOTE — Telephone Encounter (Signed)
 Copied from CRM [id number]. Topic: General - Billing Inquiry >> May 12, 2023  4:09 PM Star East wrote: Reason for CRM: Patient wants fee removed for missed appointment

## 2023-05-22 DIAGNOSIS — U071 COVID-19: Secondary | ICD-10-CM | POA: Diagnosis not present

## 2023-05-28 ENCOUNTER — Other Ambulatory Visit: Payer: Self-pay | Admitting: Family Medicine

## 2023-05-28 ENCOUNTER — Encounter: Payer: Self-pay | Admitting: Family Medicine

## 2023-06-03 ENCOUNTER — Other Ambulatory Visit: Payer: Self-pay | Admitting: Family Medicine

## 2023-06-03 DIAGNOSIS — J439 Emphysema, unspecified: Secondary | ICD-10-CM

## 2023-06-10 DIAGNOSIS — H43393 Other vitreous opacities, bilateral: Secondary | ICD-10-CM | POA: Diagnosis not present

## 2023-06-10 DIAGNOSIS — H5213 Myopia, bilateral: Secondary | ICD-10-CM | POA: Diagnosis not present

## 2023-06-22 DIAGNOSIS — U071 COVID-19: Secondary | ICD-10-CM | POA: Diagnosis not present

## 2023-06-22 NOTE — Progress Notes (Unsigned)
 [name] [address], [address] [place], [place] [address] [phone]  Date:  06/23/2023   Name:  Johnathan [name]   DOB:  [birth year]   MRN:  [id number]  PCP:  Kaylee Partridge, MD    Chief Complaint: No chief complaint on file.   History of Present Illness:  Johnathan [name] is a 89 y.o. very pleasant male patient who presents with the following:  Patient seen today for follow-up and to discuss his balance I saw him about 3 weeks ago History of emphysema, diabetes, COPD, hypertension, former smoker. Attikus had been doing excellently for age until he got COVID in February 2022; he subsequently developed pulmonary fibrosis and is still using oxygen    At our last visit his A1c showed good control of diabetes and he was overall stable.  He was using Trelegy, rarely needed his albuterol   Amlodipine  5 Aspirin  81 Trelegy Ellipta  Lasix  20 mg daily as needed Meloxicam  Metformin  250 mg daily Prilosec 20 Pravachol  40 Patient Active Problem List   Diagnosis Date Noted   Severe sepsis (HCC) 06/18/2022   Hyperlipidemia associated with type 2 diabetes mellitus (HCC) 06/18/2022   Preop pulmonary/respiratory exam 04/09/2022   GERD (gastroesophageal reflux disease) 09/11/2021   Acute on chronic respiratory failure with hypoxia (HCC) 09/10/2021   Interstitial pulmonary disease (HCC) 03/19/2021   Hypertension associated with diabetes (HCC) 03/19/2021   Oral candidiasis 03/05/2021   Chronic respiratory failure with hypoxia (HCC) 03/05/2021   Trigger finger, right ring finger 02/13/2021   COPD with chronic bronchitis and emphysema (HCC) 04/26/2020   COVID-19 02/25/2020   Shingles 11/14/2019   Raynaud's syndrome 11/14/2019   Skin cancer 11/14/2019   Left inguinal hernia s/p lap repair w mesh 05/06/2019 05/06/2019   Recurrent right inguinal hernia s/p lap repair w mesh 05/06/2019 03/01/2019   Type 2 diabetes mellitus (HCC) 03/01/2019    Community acquired pneumonia of right lower lobe of lung 04/21/2014    Past Medical History:  Diagnosis Date   Anal fissure    COPD (chronic obstructive pulmonary disease) (HCC)    Diverticulosis    DM type 2 (diabetes mellitus, type 2) (HCC)    GERD (gastroesophageal reflux disease)    uses prilosec    Hypertension    Pneumonia 2018   Raynaud's disease    Shortness of breath dyspnea    WITH EXERTION   Sinusitis    Tremors of nervous system     Past Surgical History:  Procedure Laterality Date   COLONOSCOPY  03/27/2005   ESOPHAGOGASTRODUODENOSCOPY (EGD) WITH PROPOFOL  N/A 02/08/2021   Procedure: ESOPHAGOGASTRODUODENOSCOPY (EGD) WITH PROPOFOL ;  Surgeon: Janel Medford, MD;  Location: [facility];  Service: Endoscopy;  Laterality: N/A;   HERNIA REPAIR     INGUINAL HERNIA REPAIR N/A 05/06/2019   Procedure: LAPAROSCOPIC RIGHT AND LEFT INGUINAL HERNIA(S) REPAIR;  Surgeon: Candyce Champagne, MD;  Location: [facility];  Service: General;  Laterality: N/A;    Social History   Tobacco Use   Smoking status: Former    Current packs/day: 0.00    Average packs/day: 1 pack/day for 45.0 years (45.0 ttl pk-yrs)    Types: Cigarettes    Start date: 01/21/1949    Quit date: 01/21/1994    Years since quitting: 29.4   Smokeless tobacco: Never  Substance Use Topics   Alcohol use: Yes    Comment: a drink daily with dinner   Drug use: No  Family History  Problem Relation Age of Onset   Dementia Brother    Diabetes Brother    Heart attack Father    Dementia Father     No Known Allergies  Medication list has been reviewed and updated.  Current Outpatient Medications on File Prior to Visit  Medication Sig Dispense Refill   amLODipine  (NORVASC ) 5 MG tablet Take 1 tablet (5 mg total) by mouth daily. 90 tablet 3   aspirin  EC 81 MG tablet Take 81 mg by mouth every morning.     B Complex-C (SUPER B COMPLEX PO) Take 1 tablet by mouth at bedtime.     benzonatate  (TESSALON ) 100  MG capsule Take 1 capsule (100 mg total) by mouth 2 (two) times daily as needed for cough. 20 capsule 0   cetirizine  (ZYRTEC ) 5 MG tablet Take 1 tablet (5 mg total) by mouth at bedtime. 90 tablet 1   Flaxseed, Linseed, (FLAXSEED OIL) 1200 MG CAPS Take 1,200 mg by mouth at bedtime.     Fluticasone -Umeclidin-Vilant (TRELEGY ELLIPTA ) 100-62.5-25 MCG/ACT AEPB Inhale 1 puff into the lungs daily. USE 1 INHALATION BY MOUTH INTO  THE LUNGS DAILY AT THE SAME TIME EACH DAY 60 each 11   furosemide  (LASIX ) 20 MG tablet Take 1 tablet (20 mg total) by mouth daily as needed for edema. 30 tablet 0   Ginkgo Biloba EXTR Take 1 capsule by mouth 2 (two) times daily.     guaiFENesin  (MUCINEX ) 600 MG 12 hr tablet Take 2 tablets (1,200 mg total) by mouth 2 (two) times daily. (Patient taking differently: Take 600 mg by mouth 3 (three) times daily.) 60 tablet 0   ketoconazole  (NIZORAL ) 2 % cream Apply 1 Application topically daily. Use as needed for fungal infection 15 g 1   ketoconazole  (NIZORAL ) 2 % shampoo Apply 1 Application topically 2 (two) times a week. Use as needed for itchy scalp 120 mL PRN   meloxicam  (MOBIC ) 15 MG tablet TAKE 1 TABLET BY MOUTH IN THE MORNING 90 tablet 0   metFORMIN  (GLUCOPHAGE ) 500 MG tablet Take 0.5 tablets (250 mg total) by mouth daily. 45 tablet 3   Misc Natural Products (HIMALAYAN GOJI PO) Take 6 Doses by mouth daily. Goji berries     Multiple Vitamin (MULTIVITAMIN WITH MINERALS) TABS tablet Take 1 tablet by mouth daily with breakfast.     omeprazole  (PRILOSEC) 20 MG capsule Take 1 capsule (20 mg total) by mouth 2 (two) times daily. 180 capsule 3   OXYGEN  Inhale 2 L/min into the lungs continuous.     pravastatin  (PRAVACHOL ) 40 MG tablet Take 0.5 tablets (20 mg total) by mouth daily. 45 tablet 1   primidone  (MYSOLINE ) 50 MG tablet Take 1 tablet (50 mg total) by mouth 2 (two) times daily. 200 tablet 2   SYSTANE COMPLETE PF 0.6 % SOLN Place 1 drop into the right eye in the morning, at noon, and  at bedtime.     tadalafil  (CIALIS ) 20 MG tablet TAKE 1/2 (ONE-HALF) TO 1 TABLET BY MOUTH 2 HOURS PRIOR TO SEXUAL ACTIVITY 15 tablet 0   terazosin  (HYTRIN ) 1 MG capsule Take 1 capsule (1 mg total) by mouth 2 (two) times daily. 180 capsule 1   traMADol  (ULTRAM ) 50 MG tablet Take 1 tablet (50 mg total) by mouth every 12 (twelve) hours as needed (for back pain). 30 tablet 0   triamcinolone  cream (KENALOG ) 0.1 % Apply 1 Application topically 2 (two) times daily as needed (rash/itching).     VENTOLIN   HFA 108 (90 Base) MCG/ACT inhaler Inhale 2 puffs into the lungs every 6 (six) hours as needed for wheezing or shortness of breath. 18 g 5   VITAMIN E PO Take 1 capsule by mouth daily with breakfast.     No current facility-administered medications on file prior to visit.    Review of Systems:  As per HPI- otherwise negative.   Physical Examination: There were no vitals filed for this visit. There were no vitals filed for this visit. There is no height or weight on file to calculate BMI. Ideal Body Weight:    GEN: no acute distress. HEENT: Atraumatic, Normocephalic.  Ears and Nose: No external deformity. CV: RRR, No M/G/R. No JVD. No thrill. No extra heart sounds. PULM: CTA B, no wheezes, crackles, rhonchi. No retractions. No resp. distress. No accessory muscle use. ABD: S, NT, ND, +BS. No rebound. No HSM. EXTR: No c/c/e PSYCH: Normally interactive. Conversant.  Foot exam due  Assessment and Plan: ***  Signed Gates Kasal, MD

## 2023-06-23 ENCOUNTER — Ambulatory Visit (INDEPENDENT_AMBULATORY_CARE_PROVIDER_SITE_OTHER): Admitting: Family Medicine

## 2023-06-23 ENCOUNTER — Encounter: Payer: Self-pay | Admitting: Family Medicine

## 2023-06-23 VITALS — BP 140/74 | HR 63 | Ht 69.0 in | Wt 149.0 lb

## 2023-06-23 DIAGNOSIS — R2681 Unsteadiness on feet: Secondary | ICD-10-CM

## 2023-06-23 DIAGNOSIS — N3941 Urge incontinence: Secondary | ICD-10-CM

## 2023-06-23 DIAGNOSIS — Z9981 Dependence on supplemental oxygen: Secondary | ICD-10-CM | POA: Diagnosis not present

## 2023-06-23 MED ORDER — OXYBUTYNIN CHLORIDE ER 5 MG PO TB24
5.0000 mg | ORAL_TABLET | Freq: Every day | ORAL | 1 refills | Status: DC
Start: 2023-06-23 — End: 2023-06-23

## 2023-06-23 MED ORDER — MIRABEGRON ER 50 MG PO TB24: 50.0000 mg | ORAL_TABLET | Freq: Every day | ORAL | 2 refills | Status: DC

## 2023-06-23 NOTE — Patient Instructions (Addendum)
 Try the Myrbetriq for incontinence.  I will be in touch with your urine culture asap If your insurance will not cover the Myrbetriq let me know and we will try something else

## 2023-06-24 LAB — URINE CULTURE
MICRO NUMBER:: 16526559
Result:: NO GROWTH
SPECIMEN QUALITY:: ADEQUATE

## 2023-06-25 ENCOUNTER — Encounter: Payer: Self-pay | Admitting: Family Medicine

## 2023-07-02 ENCOUNTER — Telehealth: Payer: Self-pay

## 2023-07-02 NOTE — Telephone Encounter (Signed)
 LMOM informing Pt that he can find canes at local pharmacies or [facility] on [facility].

## 2023-07-02 NOTE — Telephone Encounter (Signed)
 Copied from CRM [id number]. Topic: Clinical - Medical Advice >> Jul 02, 2023 12:23 PM Juleen Oakland F wrote: Reason for CRM: Patient would like to know where he can get a walking cane in [place]? Please call [phone] (M)

## 2023-07-15 ENCOUNTER — Other Ambulatory Visit: Payer: Self-pay | Admitting: Family Medicine

## 2023-07-15 DIAGNOSIS — N529 Male erectile dysfunction, unspecified: Secondary | ICD-10-CM

## 2023-07-21 ENCOUNTER — Telehealth: Payer: Self-pay | Admitting: Family Medicine

## 2023-07-21 NOTE — Telephone Encounter (Signed)
 Copied from CRM [id number]. Topic: General - Other >> Jul 21, 2023 10:47 AM Wess RAMAN wrote: Reason for CRM:  Patient stated he needs his cane authorization faxed to [employer]  Fax #: [phone]

## 2023-07-22 DIAGNOSIS — U071 COVID-19: Secondary | ICD-10-CM | POA: Diagnosis not present

## 2023-07-22 DIAGNOSIS — R2689 Other abnormalities of gait and mobility: Secondary | ICD-10-CM | POA: Diagnosis not present

## 2023-07-22 DIAGNOSIS — R269 Unspecified abnormalities of gait and mobility: Secondary | ICD-10-CM | POA: Diagnosis not present

## 2023-07-23 ENCOUNTER — Other Ambulatory Visit: Payer: Self-pay | Admitting: Family Medicine

## 2023-07-23 DIAGNOSIS — N529 Male erectile dysfunction, unspecified: Secondary | ICD-10-CM

## 2023-08-06 ENCOUNTER — Other Ambulatory Visit: Payer: Self-pay | Admitting: Family Medicine

## 2023-08-06 DIAGNOSIS — J441 Chronic obstructive pulmonary disease with (acute) exacerbation: Secondary | ICD-10-CM

## 2023-08-06 MED ORDER — TRELEGY ELLIPTA 100-62.5-25 MCG/ACT IN AEPB: 1.0000 | INHALATION_SPRAY | Freq: Every day | RESPIRATORY_TRACT | 1 refills | Status: AC

## 2023-08-06 NOTE — Telephone Encounter (Signed)
 Copied from CRM [id number]. Topic: Clinical - Medication Refill >> Aug 06, 2023  8:51 AM Henretta I wrote: Medication: Fluticasone -Umeclidin-Vilant (TRELEGY ELLIPTA ) 100-62.5-25 MCG/ACT AEPB  Has the patient contacted their pharmacy? No (Agent: If no, request that the patient contact the pharmacy for the refill. If patient does not wish to contact the pharmacy document the reason why and proceed with request.) (Agent: If yes, when and what did the pharmacy advise?)  This is the patient's preferred pharmacy:  [facility] [address], [place] - [address] [place] [address] Phone: [phone] Fax: [phone]  Is this the correct pharmacy for this prescription? Yes If no, delete pharmacy and type the correct one.   Has the prescription been filled recently? No  Is the patient out of the medication? Yes  Has the patient been seen for an appointment in the last year OR does the patient have an upcoming appointment? Yes  Can we respond through MyChart? Yes  Agent: Please be advised that Rx refills may take up to 3 business days. We ask that you follow-up with your pharmacy.

## 2023-08-07 ENCOUNTER — Encounter: Payer: Self-pay | Admitting: Family Medicine

## 2023-08-16 ENCOUNTER — Other Ambulatory Visit: Payer: Self-pay | Admitting: Family Medicine

## 2023-08-18 ENCOUNTER — Telehealth: Payer: Self-pay

## 2023-08-18 NOTE — Telephone Encounter (Signed)
 Copied from CRM #[id number]. Topic: General - Other >> Aug 18, 2023  1:14 PM Franky GRADE wrote: Reason for CRM: Patient is calling to inform that he qualifies for a portable oxygen  machine and that the company will be calling the office for information to process the equipment for the patient.

## 2023-08-18 NOTE — Telephone Encounter (Signed)
 Noted

## 2023-08-21 ENCOUNTER — Other Ambulatory Visit: Payer: Self-pay | Admitting: Family Medicine

## 2023-08-21 DIAGNOSIS — N529 Male erectile dysfunction, unspecified: Secondary | ICD-10-CM

## 2023-08-22 ENCOUNTER — Telehealth: Payer: Self-pay | Admitting: Pulmonary Disease

## 2023-08-22 DIAGNOSIS — U071 COVID-19: Secondary | ICD-10-CM | POA: Diagnosis not present

## 2023-08-22 NOTE — Telephone Encounter (Signed)
 Copied from CRM [id number]. Topic: Clinical - Order For Equipment >> Aug 21, 2023  1:25 PM Rilla B wrote: Avashun at [employer]  calling to check status on request for notes for patient portable oxygen .  Please call to follow up [phone].    [employer] inquiry has been placed in provider's box and in route for review by clinical team. Routing to clinical to assist on providing a status on their request.

## 2023-08-22 NOTE — Telephone Encounter (Signed)
 Faxed last office note on patient

## 2023-08-27 NOTE — Telephone Encounter (Signed)
 Avashun  w/ [employer] received the notes, but their is no desaturation of 88 or below or the 6 min walk test.. It can be w/in the last 12 months for the requirements.  Please fax so they can complete, or call w/ any concerns  Fax  [phone]  Cb  [phone]

## 2023-08-28 NOTE — Telephone Encounter (Signed)
 Walk paperwork faxed to [employer]    NFN

## 2023-09-01 ENCOUNTER — Telehealth: Payer: Self-pay | Admitting: Pulmonary Disease

## 2023-09-01 NOTE — Telephone Encounter (Signed)
 Copied from CRM #[id number]. Topic: Clinical - Order For Equipment >> Sep 01, 2023  9:45 AM Celestine FALCON wrote: Reason for CRM: Abashawn from [employer]  was calling to check on the prefilled order for the pt's portable oxygen  sent to Dr. Theophilus. I did not see it in the pt's chart, please complete and follow up with [employer] via phone [phone] and fax [phone].

## 2023-09-02 ENCOUNTER — Other Ambulatory Visit: Payer: Self-pay | Admitting: Family Medicine

## 2023-09-02 NOTE — Telephone Encounter (Signed)
 Abashawn state she has sent over an rder that neds tobe sgne by the dr and retbed asap,  everything else has been recived  Fax [phone] Cb  [phone] (secure line)

## 2023-09-04 NOTE — Telephone Encounter (Signed)
 [employer] form signed and faxed  Confirmation received and placed in scan folder  Nothing further needed

## 2023-09-05 DIAGNOSIS — J4489 Other specified chronic obstructive pulmonary disease: Secondary | ICD-10-CM | POA: Diagnosis not present

## 2023-09-05 DIAGNOSIS — J439 Emphysema, unspecified: Secondary | ICD-10-CM | POA: Diagnosis not present

## 2023-09-18 ENCOUNTER — Other Ambulatory Visit: Payer: Self-pay | Admitting: Family Medicine

## 2023-09-18 DIAGNOSIS — N3941 Urge incontinence: Secondary | ICD-10-CM

## 2023-09-30 DIAGNOSIS — J449 Chronic obstructive pulmonary disease, unspecified: Secondary | ICD-10-CM | POA: Diagnosis not present

## 2023-10-02 DIAGNOSIS — J449 Chronic obstructive pulmonary disease, unspecified: Secondary | ICD-10-CM | POA: Diagnosis not present

## 2023-10-03 DIAGNOSIS — J449 Chronic obstructive pulmonary disease, unspecified: Secondary | ICD-10-CM | POA: Diagnosis not present

## 2023-10-06 DIAGNOSIS — J439 Emphysema, unspecified: Secondary | ICD-10-CM | POA: Diagnosis not present

## 2023-10-06 DIAGNOSIS — J4489 Other specified chronic obstructive pulmonary disease: Secondary | ICD-10-CM | POA: Diagnosis not present

## 2023-10-07 DIAGNOSIS — L821 Other seborrheic keratosis: Secondary | ICD-10-CM | POA: Diagnosis not present

## 2023-10-07 DIAGNOSIS — L570 Actinic keratosis: Secondary | ICD-10-CM | POA: Diagnosis not present

## 2023-10-07 DIAGNOSIS — D692 Other nonthrombocytopenic purpura: Secondary | ICD-10-CM | POA: Diagnosis not present

## 2023-10-07 DIAGNOSIS — Z85828 Personal history of other malignant neoplasm of skin: Secondary | ICD-10-CM | POA: Diagnosis not present

## 2023-10-07 DIAGNOSIS — L905 Scar conditions and fibrosis of skin: Secondary | ICD-10-CM | POA: Diagnosis not present

## 2023-10-07 DIAGNOSIS — Z85820 Personal history of malignant melanoma of skin: Secondary | ICD-10-CM | POA: Diagnosis not present

## 2023-10-13 ENCOUNTER — Other Ambulatory Visit: Payer: Self-pay | Admitting: Family Medicine

## 2023-10-13 DIAGNOSIS — N529 Male erectile dysfunction, unspecified: Secondary | ICD-10-CM

## 2023-10-21 DIAGNOSIS — J449 Chronic obstructive pulmonary disease, unspecified: Secondary | ICD-10-CM | POA: Diagnosis not present

## 2023-10-28 NOTE — Patient Instructions (Incomplete)
 It was good to see you today I will be in touch with your labs I do recommend a COVID booster this fall if not done already- can be done at our pharmacy Flu shot today Please see me in about 6 months assuming all is well

## 2023-10-28 NOTE — Progress Notes (Unsigned)
 [facility] [address] [name], [place] [address] [phone]  Date:  10/29/2023   Name:  [place] [name]   DOB:  1932-07-09   MRN:  [id number]  PCP:  Watt Harlene BROCKS, [place]    Chief Complaint: No chief complaint on file.   History of Present Illness:  LEX LINHARES is a 89 y.o. very pleasant male patient who presents with the following:  Patient seen today for periodic follow-up.  I saw him most recently in June History of emphysema, diabetes, COPD, hypertension, former smoker. Dawson had been doing excellently for age until he got COVID in February 2022; he subsequently developed pulmonary fibrosis and is still using oxygen    I gave him a prescription for Myrbetriq  at last visit but I am not sure if his insurance covers it Can update labs, A1c today Lab Results  Component Value Date   HGBA1C 6.4 04/30/2023  Also due for foot exam Eye exam Flu shot Recommend COVID booster this fall RSV, Shingrix , pneumonia vaccinations complete  Amlodipine  5, aspirin  81, Trelegy, meloxicam , metformin  250 mg daily, Myrbetriq , Prilosec, pravastatin  primidone  50 twice daily, Cialis , terazosin , tramadol  as needed  Discussed the use of AI scribe software for clinical note transcription with the patient, who gave verbal consent to proceed.  History of Present Illness     Patient Active Problem List   Diagnosis Date Noted   Severe sepsis (HCC) 06/18/2022   Hyperlipidemia associated with type 2 diabetes mellitus (HCC) 06/18/2022   Preop pulmonary/respiratory exam 04/09/2022   GERD (gastroesophageal reflux disease) 09/11/2021   Acute on chronic respiratory failure with hypoxia (HCC) 09/10/2021   Interstitial pulmonary disease (HCC) 03/19/2021   Hypertension associated with diabetes (HCC) 03/19/2021   Oral candidiasis 03/05/2021   Chronic respiratory failure with hypoxia (HCC) 03/05/2021   Trigger finger, right ring finger  02/13/2021   COPD with chronic bronchitis and emphysema (HCC) 04/26/2020   COVID-19 02/25/2020   Shingles 11/14/2019   Raynaud's syndrome 11/14/2019   Skin cancer 11/14/2019   Left inguinal hernia s/p lap repair w mesh 05/06/2019 05/06/2019   Recurrent right inguinal hernia s/p lap repair w mesh 05/06/2019 03/01/2019   Type 2 diabetes mellitus (HCC) 03/01/2019   Community acquired pneumonia of right lower lobe of lung 04/21/2014    Past Medical History:  Diagnosis Date   Anal fissure    COPD (chronic obstructive pulmonary disease) (HCC)    Diverticulosis    DM type 2 (diabetes mellitus, type 2) (HCC)    GERD (gastroesophageal reflux disease)    uses prilosec    Hypertension    Pneumonia 2018   Raynaud's disease    Shortness of breath dyspnea    WITH EXERTION   Sinusitis    Tremors of nervous system     Past Surgical History:  Procedure Laterality Date   COLONOSCOPY  03/27/2005   ESOPHAGOGASTRODUODENOSCOPY (EGD) WITH PROPOFOL  N/A 02/08/2021   Procedure: ESOPHAGOGASTRODUODENOSCOPY (EGD) WITH PROPOFOL ;  Surgeon: Teressa Toribio SQUIBB, [place];  Location: [facility];  Service: Endoscopy;  Laterality: N/A;   HERNIA REPAIR     INGUINAL HERNIA REPAIR N/A 05/06/2019   Procedure: LAPAROSCOPIC RIGHT AND LEFT INGUINAL HERNIA(S) REPAIR;  Surgeon: Sheldon Standing, [place];  Location: [facility];  Service: General;  Laterality: N/A;    Social History   Tobacco Use   Smoking status: Former    Current packs/day: 0.00    Average packs/day: 1 pack/day  for 45.0 years (45.0 ttl pk-yrs)    Types: Cigarettes    Start date: 01/21/1949    Quit date: 01/21/1994    Years since quitting: 29.7   Smokeless tobacco: Never  Substance Use Topics   Alcohol use: Yes    Comment: a drink daily with dinner   Drug use: No    Family History  Problem Relation Age of Onset   Dementia Brother    Diabetes Brother    Heart attack Father    Dementia Father     No Known Allergies  Medication list has been  reviewed and updated.  Current Outpatient Medications on File Prior to Visit  Medication Sig Dispense Refill   amLODipine  (NORVASC ) 5 MG tablet Take 1 tablet (5 mg total) by mouth daily. 90 tablet 3   aspirin  EC 81 MG tablet Take 81 mg by mouth every morning.     B Complex-C (SUPER B COMPLEX PO) Take 1 tablet by mouth at bedtime.     benzonatate  (TESSALON ) 100 MG capsule Take 1 capsule (100 mg total) by mouth 2 (two) times daily as needed for cough. (Patient not taking: Reported on 06/23/2023) 20 capsule 0   cetirizine  (ZYRTEC ) 5 MG tablet Take 1 tablet (5 mg total) by mouth at bedtime. 90 tablet 1   Flaxseed, Linseed, (FLAXSEED OIL) 1200 MG CAPS Take 1,200 mg by mouth at bedtime.     Fluticasone -Umeclidin-Vilant (TRELEGY ELLIPTA ) 100-62.5-25 MCG/ACT AEPB Inhale 1 puff into the lungs daily. Rinse mouth after use 180 each 1   Ginkgo Biloba EXTR Take 1 capsule by mouth 2 (two) times daily.     guaiFENesin  (MUCINEX ) 600 MG 12 hr tablet Take 2 tablets (1,200 mg total) by mouth 2 (two) times daily. (Patient not taking: Reported on 06/23/2023) 60 tablet 0   ketoconazole  (NIZORAL ) 2 % cream Apply 1 Application topically daily. Use as needed for fungal infection 15 g 1   ketoconazole  (NIZORAL ) 2 % shampoo Apply 1 Application topically 2 (two) times a week. Use as needed for itchy scalp 120 mL PRN   meloxicam  (MOBIC ) 15 MG tablet TAKE 1 TABLET BY MOUTH IN THE MORNING 90 tablet 0   metFORMIN  (GLUCOPHAGE ) 500 MG tablet Take 0.5 tablets (250 mg total) by mouth daily. 45 tablet 3   Misc Natural Products (HIMALAYAN GOJI PO) Take 6 Doses by mouth daily. Goji berries     Multiple Vitamin (MULTIVITAMIN WITH MINERALS) TABS tablet Take 1 tablet by mouth daily with breakfast.     MYRBETRIQ  50 MG TB24 tablet Take 1 tablet (50 mg total) by mouth daily. 90 tablet 0   omeprazole  (PRILOSEC) 20 MG capsule Take 1 capsule (20 mg total) by mouth 2 (two) times daily. 180 capsule 3   OXYGEN  Inhale 2 L/min into the lungs  continuous.     pravastatin  (PRAVACHOL ) 40 MG tablet Take 0.5 tablets (20 mg total) by mouth daily. 45 tablet 1   primidone  (MYSOLINE ) 50 MG tablet Take 1 tablet (50 mg total) by mouth 2 (two) times daily. 180 tablet 1   SYSTANE COMPLETE PF 0.6 % SOLN Place 1 drop into the right eye in the morning, at noon, and at bedtime.     tadalafil  (CIALIS ) 20 MG tablet TAKE 1/2 TO 1 (ONE-HALF TO ONE) TABLET BY MOUTH AS NEEDED 2 HOURS PRIOR TO SEXUAL ACTIVITY 15 tablet 0   terazosin  (HYTRIN ) 1 MG capsule Take 1 capsule (1 mg total) by mouth 2 (two) times daily. 180 capsule 1  traMADol  (ULTRAM ) 50 MG tablet Take 1 tablet (50 mg total) by mouth every 12 (twelve) hours as needed (for back pain). 30 tablet 0   triamcinolone  cream (KENALOG ) 0.1 % Apply 1 Application topically 2 (two) times daily as needed (rash/itching).     VENTOLIN  HFA 108 (90 Base) MCG/ACT inhaler Inhale 2 puffs into the lungs every 6 (six) hours as needed for wheezing or shortness of breath. 18 g 5   VITAMIN E PO Take 1 capsule by mouth daily with breakfast.     No current facility-administered medications on file prior to visit.    Review of Systems:  As per HPI- otherwise negative.   Physical Examination: There were no vitals filed for this visit. There were no vitals filed for this visit. There is no height or weight on file to calculate BMI. Ideal Body Weight:    GEN: no acute distress. HEENT: Atraumatic, Normocephalic.  Ears and Nose: No external deformity. CV: RRR, No M/G/R. No JVD. No thrill. No extra heart sounds. PULM: CTA B, no wheezes, crackles, rhonchi. No retractions. No resp. distress. No accessory muscle use. ABD: S, NT, ND, +BS. No rebound. No HSM. EXTR: No c/c/e PSYCH: Normally interactive. Conversant.    Assessment and Plan: No diagnosis found.  Assessment & Plan   Signed Harlene Schroeder, [place]

## 2023-10-29 ENCOUNTER — Other Ambulatory Visit: Payer: Self-pay | Admitting: Family Medicine

## 2023-10-29 ENCOUNTER — Encounter: Payer: Self-pay | Admitting: Family Medicine

## 2023-10-29 ENCOUNTER — Ambulatory Visit: Admitting: Family Medicine

## 2023-10-29 ENCOUNTER — Other Ambulatory Visit (HOSPITAL_BASED_OUTPATIENT_CLINIC_OR_DEPARTMENT_OTHER): Payer: Self-pay

## 2023-10-29 VITALS — BP 136/82 | HR 66 | Ht 69.0 in | Wt 150.4 lb

## 2023-10-29 DIAGNOSIS — N529 Male erectile dysfunction, unspecified: Secondary | ICD-10-CM

## 2023-10-29 DIAGNOSIS — E1169 Type 2 diabetes mellitus with other specified complication: Secondary | ICD-10-CM | POA: Diagnosis not present

## 2023-10-29 DIAGNOSIS — E785 Hyperlipidemia, unspecified: Secondary | ICD-10-CM

## 2023-10-29 DIAGNOSIS — Z23 Encounter for immunization: Secondary | ICD-10-CM | POA: Diagnosis not present

## 2023-10-29 DIAGNOSIS — D649 Anemia, unspecified: Secondary | ICD-10-CM | POA: Diagnosis not present

## 2023-10-29 DIAGNOSIS — I10 Essential (primary) hypertension: Secondary | ICD-10-CM | POA: Diagnosis not present

## 2023-10-29 DIAGNOSIS — Z7984 Long term (current) use of oral hypoglycemic drugs: Secondary | ICD-10-CM

## 2023-10-29 DIAGNOSIS — J4489 Other specified chronic obstructive pulmonary disease: Secondary | ICD-10-CM | POA: Diagnosis not present

## 2023-10-29 DIAGNOSIS — J439 Emphysema, unspecified: Secondary | ICD-10-CM

## 2023-10-29 DIAGNOSIS — R1319 Other dysphagia: Secondary | ICD-10-CM | POA: Diagnosis not present

## 2023-10-29 DIAGNOSIS — N3941 Urge incontinence: Secondary | ICD-10-CM

## 2023-10-29 DIAGNOSIS — E119 Type 2 diabetes mellitus without complications: Secondary | ICD-10-CM

## 2023-10-29 MED ORDER — AMLODIPINE BESYLATE 5 MG PO TABS: 5.0000 mg | ORAL_TABLET | Freq: Every day | ORAL | 3 refills | Status: AC

## 2023-10-29 MED ORDER — MYRBETRIQ 50 MG PO TB24: 50.0000 mg | ORAL_TABLET | Freq: Every day | ORAL | 0 refills | Status: AC

## 2023-10-29 MED ORDER — METFORMIN HCL 500 MG PO TABS: 250.0000 mg | ORAL_TABLET | Freq: Every day | ORAL | 3 refills | Status: AC

## 2023-10-29 MED ORDER — OMEPRAZOLE 20 MG PO CPDR: 20.0000 mg | DELAYED_RELEASE_CAPSULE | Freq: Two times a day (BID) | ORAL | 3 refills | Status: AC

## 2023-10-29 MED ORDER — COMIRNATY 30 MCG/0.3ML IM SUSY
0.3000 mL | PREFILLED_SYRINGE | Freq: Once | INTRAMUSCULAR | 0 refills | Status: AC
  Filled 2023-10-29: qty 0.3, 1d supply, fill #0

## 2023-10-29 MED ORDER — PRAVASTATIN SODIUM 40 MG PO TABS: 20.0000 mg | ORAL_TABLET | Freq: Every day | ORAL | 1 refills | Status: AC

## 2023-10-30 ENCOUNTER — Encounter: Payer: Self-pay | Admitting: Family Medicine

## 2023-10-30 LAB — COMPREHENSIVE METABOLIC PANEL WITH GFR
ALT: 11 U/L (ref 0–53)
AST: 15 U/L (ref 0–37)
Albumin: 4.3 g/dL (ref 3.5–5.2)
Alkaline Phosphatase: 82 U/L (ref 39–117)
BUN: 25 mg/dL — ABNORMAL HIGH (ref 6–23)
CO2: 30 meq/L (ref 19–32)
Calcium: 9.3 mg/dL (ref 8.4–10.5)
Chloride: 101 meq/L (ref 96–112)
Creatinine, Ser: 1.18 mg/dL (ref 0.40–1.50)
GFR: 54.89 mL/min — ABNORMAL LOW (ref >60.00)
Glucose, Bld: 97 mg/dL (ref 70–99)
Potassium: 4.4 meq/L (ref 3.5–5.1)
Sodium: 138 meq/L (ref 135–145)
Total Bilirubin: 0.3 mg/dL (ref 0.2–1.2)
Total Protein: 7.7 g/dL (ref 6.0–8.3)

## 2023-10-30 LAB — LIPID PANEL
Cholesterol: 143 mg/dL (ref 0–200)
HDL: 36.3 mg/dL — ABNORMAL LOW (ref >39.00)
LDL Cholesterol: 79 mg/dL (ref 0–99)
NonHDL: 106.83
Total CHOL/HDL Ratio: 4
Triglycerides: 140 mg/dL (ref 0.0–149.0)
VLDL: 28 mg/dL (ref 0.0–40.0)

## 2023-10-30 LAB — CBC
HCT: 37.7 % — ABNORMAL LOW (ref 39.0–52.0)
Hemoglobin: 12.5 g/dL — ABNORMAL LOW (ref 13.0–17.0)
MCHC: 33.1 g/dL (ref 30.0–36.0)
MCV: 96.1 fl (ref 78.0–100.0)
Platelets: 149 K/uL — ABNORMAL LOW (ref 150.0–400.0)
RBC: 3.93 Mil/uL — ABNORMAL LOW (ref 4.22–5.81)
RDW: 14.8 % (ref 11.5–15.5)
WBC: 4.9 K/uL (ref 4.0–10.5)

## 2023-10-30 LAB — HEMOGLOBIN A1C: Hgb A1c MFr Bld: 6.7 % — ABNORMAL HIGH (ref 4.6–6.5)

## 2023-10-30 NOTE — Addendum Note (Signed)
 Addended by: WATT RAISIN C on: 10/30/2023 12:16 PM   Modules accepted: Orders

## 2023-11-05 DIAGNOSIS — J439 Emphysema, unspecified: Secondary | ICD-10-CM | POA: Diagnosis not present

## 2023-11-05 DIAGNOSIS — J4489 Other specified chronic obstructive pulmonary disease: Secondary | ICD-10-CM | POA: Diagnosis not present

## 2023-11-13 ENCOUNTER — Other Ambulatory Visit: Payer: Self-pay | Admitting: Family Medicine

## 2023-11-27 ENCOUNTER — Ambulatory Visit: Admitting: Pulmonary Disease

## 2023-11-27 ENCOUNTER — Encounter: Payer: Self-pay | Admitting: Pulmonary Disease

## 2023-11-27 VITALS — BP 143/68 | HR 58 | Ht 69.0 in | Wt 153.0 lb

## 2023-11-27 DIAGNOSIS — J449 Chronic obstructive pulmonary disease, unspecified: Secondary | ICD-10-CM | POA: Diagnosis not present

## 2023-11-27 DIAGNOSIS — J4489 Other specified chronic obstructive pulmonary disease: Secondary | ICD-10-CM

## 2023-11-27 DIAGNOSIS — Z87891 Personal history of nicotine dependence: Secondary | ICD-10-CM

## 2023-11-27 DIAGNOSIS — J9610 Chronic respiratory failure, unspecified whether with hypoxia or hypercapnia: Secondary | ICD-10-CM | POA: Diagnosis not present

## 2023-11-27 LAB — CBC WITH DIFFERENTIAL/PLATELET
Basophils Absolute: 0 K/uL (ref 0.0–0.1)
Basophils Relative: 0.7 % (ref 0.0–3.0)
Eosinophils Absolute: 0.2 K/uL (ref 0.0–0.7)
Eosinophils Relative: 3.4 % (ref 0.0–5.0)
HCT: 37.4 % — ABNORMAL LOW (ref 39.0–52.0)
Hemoglobin: 12.5 g/dL — ABNORMAL LOW (ref 13.0–17.0)
Lymphocytes Relative: 37.1 % (ref 12.0–46.0)
Lymphs Abs: 1.7 K/uL (ref 0.7–4.0)
MCHC: 33.4 g/dL (ref 30.0–36.0)
MCV: 94.1 fl (ref 78.0–100.0)
Monocytes Absolute: 0.5 K/uL (ref 0.1–1.0)
Monocytes Relative: 11.7 % (ref 3.0–12.0)
Neutro Abs: 2.1 K/uL (ref 1.4–7.7)
Neutrophils Relative %: 47.1 % (ref 43.0–77.0)
Platelets: 159 K/uL (ref 150.0–400.0)
RBC: 3.98 Mil/uL — ABNORMAL LOW (ref 4.22–5.81)
RDW: 14.8 % (ref 11.5–15.5)
WBC: 4.5 K/uL (ref 4.0–10.5)

## 2023-11-27 NOTE — Progress Notes (Signed)
 BLAYN WHETSELL    [id number]    09/28/1932  Primary Care Physician:Copland, Harlene BROCKS, MD  Referring Physician: Watt Harlene BROCKS, MD [address] [place],  [place] [address]  Chief complaint: Follow-up for asthma, COPD, post COVID-48  HPI: 89 year old ex-smoker with history of emphysema [no PFTs on record], Raynaud's syndrome Maintained on Symbicort  for many years  Hospitalized for COVID-19 in early February 2022 in spite of getting the booster vaccine. He was treated with IV remdesivir , steroids, discharged on supplemental oxygen  Post discharge he continues to have persistent dyspnea on exertion, hypoxia whenever he stops oxygen .  Follow-up chest x-ray shows persistent lung infiltrate and he has been referred to pulmonary for further evaluation Advair changed to Trelegy in 2021.  He feels that this is working better for him He experienced a severe COPD exacerbation, pneumonia, and sepsis in May 2025, requiring hospitalization  Interim history: Discussed the use of AI scribe software for clinical note transcription with the patient, who gave verbal consent to proceed. History of Present Illness ALPHUS ZECK is an 89 year old male with severe COPD who presents with breathing difficulties.  Dyspnea and hypoxemia - Severe chronic obstructive pulmonary disease with persistent breathing difficulties despite current therapy - Shortness of breath present even during talking - Voice has become softer - Pulse oximeter readings around 92%, with improvement during sleep  Inhaled and supplemental oxygen  therapy - Uses Trelegy inhaler at night - Oxygen  therapy at 2 liters initiated after last hospitalization - Seldom uses rescue inhaler - No nebulizer machine at home, lost during a move several years ago    Relevant pulmonary history Pets: No pets Occupation: Worked miscellaneous jobs including farm, research officer, political party, 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  No  feather pillows or comforter. Smoking history: 45-pack-year smoker.  Quit in the 1990s Travel history: No significant travel history Relevant family history: Father had COPD.  He was a smoker.  Outpatient Encounter Medications as of 11/27/2023  Medication Sig   amLODipine  (NORVASC ) 5 MG tablet Take 1 tablet (5 mg total) by mouth daily.   aspirin  EC 81 MG tablet Take 81 mg by mouth every morning.   B Complex-C (SUPER B COMPLEX PO) Take 1 tablet by mouth at bedtime.   Flaxseed, Linseed, (FLAXSEED OIL) 1200 MG CAPS Take 1,200 mg by mouth at bedtime.   Fluticasone -Umeclidin-Vilant (TRELEGY ELLIPTA ) 100-62.5-25 MCG/ACT AEPB Inhale 1 puff into the lungs daily. Rinse mouth after use   Ginkgo Biloba EXTR Take 1 capsule by mouth 2 (two) times daily.   meloxicam  (MOBIC ) 15 MG tablet TAKE 1 TABLET BY MOUTH IN THE MORNING   metFORMIN  (GLUCOPHAGE ) 500 MG tablet Take 0.5 tablets (250 mg total) by mouth daily.   Misc Natural Products (HIMALAYAN GOJI PO) Take 6 Doses by mouth daily. Goji berries   Multiple Vitamin (MULTIVITAMIN WITH MINERALS) TABS tablet Take 1 tablet by mouth daily with breakfast.   MYRBETRIQ  50 MG TB24 tablet Take 1 tablet (50 mg total) by mouth daily.   omeprazole  (PRILOSEC) 20 MG capsule Take 1 capsule (20 mg total) by mouth 2 (two) times daily.   OXYGEN  Inhale 2 L/min into the lungs continuous.   pravastatin  (PRAVACHOL ) 40 MG tablet Take 0.5 tablets (20 mg total) by mouth daily.   primidone  (MYSOLINE ) 50 MG tablet Take 1 tablet (50 mg total) by mouth 2 (two) times daily.   SYSTANE COMPLETE PF 0.6 % SOLN Place 1  drop into the right eye in the morning, at noon, and at bedtime.   tadalafil  (CIALIS ) 20 MG tablet TAKE 1/2 TO 1 (ONE-HALF TO ONE) TABLET BY MOUTH AS NEEDED 2 HOURS PRIOR TO SEXUAL ACTIVITY   terazosin  (HYTRIN ) 1 MG capsule Take 1 capsule (1 mg total) by mouth 2 (two) times daily.   traMADol  (ULTRAM ) 50 MG tablet Take 1 tablet (50 mg total) by mouth every 12 (twelve) hours as  needed (for back pain).   triamcinolone  cream (KENALOG ) 0.1 % Apply 1 Application topically 2 (two) times daily as needed (rash/itching).   VENTOLIN  HFA 108 (90 Base) MCG/ACT inhaler Inhale 2 puffs into the lungs every 6 (six) hours as needed for wheezing or shortness of breath.   VITAMIN E PO Take 1 capsule by mouth daily with breakfast.   benzonatate  (TESSALON ) 100 MG capsule Take 1 capsule (100 mg total) by mouth 2 (two) times daily as needed for cough. (Patient not taking: Reported on 11/27/2023)   cetirizine  (ZYRTEC ) 5 MG tablet Take 1 tablet (5 mg total) by mouth at bedtime.   guaiFENesin  (MUCINEX ) 600 MG 12 hr tablet Take 2 tablets (1,200 mg total) by mouth 2 (two) times daily. (Patient not taking: Reported on 11/27/2023)   ketoconazole  (NIZORAL ) 2 % cream Apply 1 Application topically daily. Use as needed for fungal infection (Patient not taking: Reported on 11/27/2023)   ketoconazole  (NIZORAL ) 2 % shampoo Apply 1 Application topically 2 (two) times a week. Use as needed for itchy scalp (Patient not taking: Reported on 11/27/2023)   No facility-administered encounter medications on file as of 11/27/2023.   Vitals:   11/27/23 1423  BP: (!) 143/68  Pulse: (!) 58  Height: 5' 9 (1.753 m)  Weight: 153 lb (69.4 kg)  SpO2: 92% Comment: 2L POC  TempSrc: Oral  BMI (Calculated): 22.58    Physical Exam GEN: No acute distress. CV: Regular rate and rhythm, no murmurs. LUNGS: Clear to auscultation bilaterally, no wheezing, normal respiratory effort. SKIN JOINTS: Warm and dry, no rash.    Data Reviewed: Imaging: Chest x-ray 02/20/2020-hyperinflation, no acute process Chest x-ray 02/25/2020-emphysema, mild left base opacity Chest x-ray 03/30/2020-bilateral infiltrates. CT high-resolution 04/28/2020- emphysema, peripheral sublenticular groundglass densities, calcified granuloma.  Indeterminate pattern CTA 09/11/2021-no PE, reticular opacities in the mid lung CT chest 10/29/2021-emphysema, improvement  in multifocal pulmonary infiltrates Chest x-ray 06/18/2022-right basilar opacity. Chest x-ray 10/24/2022-emphysema, bronchitis changes.  Streaky lower lobe opacities. I have reviewed the images personally.  PFTs: 05/26/2020 FVC 2.41 [6%], FEV1 1.23 [48%], F/F 51, TLC 5.03 [73%], DLCO 7.92 [34%] Severe obstruction, severe diffusion defect, mild restriction  Labs: CBC 02/29/2020-WBC 5.3, eos 0% CBC 05/30/2020-WBC 5, eos 7.6%, absolute eosinophil count 380 IgE 04/26/2020-858  ANA, CCP, rheumatoid factor 04/26/2020- Negative  Assessment and Plan Assessment & Plan Chronic obstructive pulmonary disease (COPD) with chronic respiratory failure Severe COPD with chronic respiratory failure, managed with Trelegy and supplemental oxygen . Oxygen  saturation is 92% at rest. Symptoms are well-managed with infrequent use of rescue inhaler. Dyspnea on exertion and soft voice present. Potential benefit from nebulizer therapy with Otover for excessive symptoms. Consideration of injection therapy if eosinophil levels indicate allergic inflammation. - Continue Trelegy. - Maintain supplemental oxygen  at 2 liters, especially during exertion and sleep. - Ordered nebulizer machine for Ohtuvayre  eosinophil levels and inflammation. - Will consider injection therapy if eosinophil levels indicate allergic inflammation.  Referral to pulmonary rehabilitation Pulmonary rehabilitation may benefit breathing and overall management of COPD. Previous attendance 5-10 years ago. -  Referred to pulmonary rehabilitation at [facility].  Plan/Recommendations: Continue Trelegy, supplemental oxygen  Pulmonary rehab Start Ohtuvayre  Check CBC differential, IgE  I personally spent a total of 30 minutes in the care of the patient today including preparing to see the patient, getting/reviewing separately obtained history, counseling and educating, placing orders, referring and communicating with other health care professionals, documenting  clinical information in the EHR, independently interpreting results, communicating results, and coordinating care.   Lonna Coder MD  Pulmonary and Critical Care 11/27/2023, 2:37 PM  CC: Copland, Harlene BROCKS, MD

## 2023-11-27 NOTE — Patient Instructions (Addendum)
  VISIT SUMMARY: Today, we addressed your severe COPD and breathing difficulties. We discussed your current treatments and made some adjustments to help manage your symptoms better.  YOUR PLAN: CHRONIC OBSTRUCTIVE PULMONARY DISEASE (COPD) WITH CHRONIC RESPIRATORY FAILURE: You have severe COPD with chronic respiratory failure, which causes persistent breathing difficulties. -Continue using your Trelegy inhaler as prescribed. -Keep using supplemental oxygen  at 2 liters, especially during exertion and sleep. -A nebulizer machine has been ordered for you to use with Ohtuvayre  therapy for excessive symptoms. -Blood tests have been ordered to check for eosinophil levels and inflammation. -We will consider injection therapy if your eosinophil levels indicate allergic inflammation.  REFERRAL TO PULMONARY REHABILITATION: Pulmonary rehabilitation can help improve your breathing and overall management of COPD. -You have been referred to pulmonary rehabilitation at [facility].

## 2023-11-28 LAB — IGE: IgE (Immunoglobulin E), Serum: 625 kU/L — ABNORMAL HIGH (ref <=114)

## 2023-12-01 ENCOUNTER — Other Ambulatory Visit: Payer: Self-pay | Admitting: Family Medicine

## 2023-12-02 ENCOUNTER — Telehealth: Payer: Self-pay

## 2023-12-02 NOTE — Telephone Encounter (Signed)
 Received Ohtuvayre  new start paperwork. Completed form and faxed with clinicals and insurance card copy to [facility]   Phone#: [phone] Fax#: [phone]

## 2023-12-03 NOTE — Telephone Encounter (Signed)
 Fax from [employer] confirming receipt of enrollment form.   Patient ID [id number] Phone # [phone]

## 2023-12-05 ENCOUNTER — Telehealth (HOSPITAL_COMMUNITY): Payer: Self-pay

## 2023-12-05 ENCOUNTER — Encounter (HOSPITAL_COMMUNITY): Payer: Self-pay

## 2023-12-05 NOTE — Telephone Encounter (Signed)
 Received fax from [employer] with summary of benefits. Referral form for Ohtuvayre  received. Rx will be triaged to [facility].. Once benefits investigation completed, pharmacy will reach out the patient to schedule shipment. If medication is unaffordable, patient will need to express financial hardship to be referred back to [facility] for patient assistance program pre-screening.   Patient ID: [id number] Pharmacy phone: [phone] [facility] Phone#: [phone]

## 2023-12-05 NOTE — Telephone Encounter (Signed)
 Called patient to see if he was interested in participating in the Pulmonary Rehab Program. Patient will come in for orientation on 12/1@1pm  and will attend the 1:15 exercise class.   Pensions consultant.

## 2023-12-05 NOTE — Telephone Encounter (Signed)
 Pt insurance is active and benefits verified through [facility] Medicare Dual  Co-pay 0, DED 0/0 met, out of pocket $9,350/$783.95 met, co-insurance 20%. no pre-authorization required, [employer] 12/05/2023@2 :17, REF# [id number]   No limit for pulmonary rehab

## 2023-12-08 ENCOUNTER — Other Ambulatory Visit: Payer: Self-pay | Admitting: Family Medicine

## 2023-12-08 ENCOUNTER — Telehealth: Payer: Self-pay

## 2023-12-08 NOTE — Telephone Encounter (Addendum)
 Copied from CRM #[id number]. Topic: Clinical - Medication Question >> Dec 05, 2023  4:03 PM Devaughn RAMAN wrote: Reason for CRM: Pt called in regarding Ohtuvayre . Pt stated he is unaware with this medication and he is unsure if he is supposed to be on it or not, pt would like a f/u callback regarding this.  Patient was called and advised to look out for a from our pharmacy. Also give him a number to call, if he's not contacted ( per pharmacy note dated 12/05/2023). NFN

## 2023-12-10 NOTE — Telephone Encounter (Signed)
 I called and spoke with patient, provided information per notes from pharmacy.  I advised I would send a message to our pharmacy staff to see if there is any more recent update.  He verbalized understanding.  Pharmacy, please advise patient on where we are in the process with the [facility].  Thank you.

## 2023-12-11 ENCOUNTER — Ambulatory Visit: Payer: Self-pay | Admitting: Pulmonary Disease

## 2023-12-11 ENCOUNTER — Other Ambulatory Visit (HOSPITAL_COMMUNITY): Payer: Self-pay

## 2023-12-11 NOTE — Telephone Encounter (Signed)
 Sent message to pt under biv encounter on 11/11 reiterating instructions to contact pharmacy to set up shipment. NFN

## 2023-12-17 ENCOUNTER — Emergency Department (HOSPITAL_COMMUNITY)

## 2023-12-17 ENCOUNTER — Ambulatory Visit: Payer: Self-pay | Admitting: Pulmonary Disease

## 2023-12-17 ENCOUNTER — Other Ambulatory Visit: Payer: Self-pay

## 2023-12-17 ENCOUNTER — Inpatient Hospital Stay (HOSPITAL_COMMUNITY)
Admission: EM | Admit: 2023-12-17 | Discharge: 2023-12-20 | DRG: 193 | Disposition: A | Discharge status: Hospice | Attending: Internal Medicine | Admitting: Internal Medicine

## 2023-12-17 DIAGNOSIS — Z7982 Long term (current) use of aspirin: Secondary | ICD-10-CM

## 2023-12-17 DIAGNOSIS — R058 Other specified cough: Secondary | ICD-10-CM

## 2023-12-17 DIAGNOSIS — E1159 Type 2 diabetes mellitus with other circulatory complications: Secondary | ICD-10-CM | POA: Diagnosis present

## 2023-12-17 DIAGNOSIS — K219 Gastro-esophageal reflux disease without esophagitis: Secondary | ICD-10-CM | POA: Diagnosis present

## 2023-12-17 DIAGNOSIS — Z8249 Family history of ischemic heart disease and other diseases of the circulatory system: Secondary | ICD-10-CM

## 2023-12-17 DIAGNOSIS — J9621 Acute and chronic respiratory failure with hypoxia: Secondary | ICD-10-CM | POA: Diagnosis not present

## 2023-12-17 DIAGNOSIS — J440 Chronic obstructive pulmonary disease with acute lower respiratory infection: Secondary | ICD-10-CM | POA: Diagnosis present

## 2023-12-17 DIAGNOSIS — E1169 Type 2 diabetes mellitus with other specified complication: Secondary | ICD-10-CM | POA: Diagnosis present

## 2023-12-17 DIAGNOSIS — Z9981 Dependence on supplemental oxygen: Secondary | ICD-10-CM

## 2023-12-17 DIAGNOSIS — Z791 Long term (current) use of non-steroidal anti-inflammatories (NSAID): Secondary | ICD-10-CM

## 2023-12-17 DIAGNOSIS — Z7951 Long term (current) use of inhaled steroids: Secondary | ICD-10-CM

## 2023-12-17 DIAGNOSIS — E7849 Other hyperlipidemia: Secondary | ICD-10-CM | POA: Diagnosis present

## 2023-12-17 DIAGNOSIS — Z1152 Encounter for screening for COVID-19: Secondary | ICD-10-CM

## 2023-12-17 DIAGNOSIS — I700 Atherosclerosis of aorta: Secondary | ICD-10-CM | POA: Diagnosis present

## 2023-12-17 DIAGNOSIS — J159 Unspecified bacterial pneumonia: Principal | ICD-10-CM | POA: Diagnosis present

## 2023-12-17 DIAGNOSIS — R051 Acute cough: Secondary | ICD-10-CM

## 2023-12-17 DIAGNOSIS — Z833 Family history of diabetes mellitus: Secondary | ICD-10-CM

## 2023-12-17 DIAGNOSIS — I7300 Raynaud's syndrome without gangrene: Secondary | ICD-10-CM | POA: Diagnosis present

## 2023-12-17 DIAGNOSIS — Z66 Do not resuscitate: Secondary | ICD-10-CM | POA: Diagnosis present

## 2023-12-17 DIAGNOSIS — K8020 Calculus of gallbladder without cholecystitis without obstruction: Secondary | ICD-10-CM | POA: Diagnosis present

## 2023-12-17 DIAGNOSIS — I2510 Atherosclerotic heart disease of native coronary artery without angina pectoris: Secondary | ICD-10-CM | POA: Diagnosis present

## 2023-12-17 DIAGNOSIS — N400 Enlarged prostate without lower urinary tract symptoms: Secondary | ICD-10-CM | POA: Diagnosis present

## 2023-12-17 DIAGNOSIS — Z79899 Other long term (current) drug therapy: Secondary | ICD-10-CM

## 2023-12-17 DIAGNOSIS — Z87891 Personal history of nicotine dependence: Secondary | ICD-10-CM

## 2023-12-17 DIAGNOSIS — J441 Chronic obstructive pulmonary disease with (acute) exacerbation: Secondary | ICD-10-CM | POA: Diagnosis present

## 2023-12-17 DIAGNOSIS — Z8701 Personal history of pneumonia (recurrent): Secondary | ICD-10-CM

## 2023-12-17 DIAGNOSIS — I152 Hypertension secondary to endocrine disorders: Secondary | ICD-10-CM | POA: Diagnosis present

## 2023-12-17 DIAGNOSIS — E119 Type 2 diabetes mellitus without complications: Secondary | ICD-10-CM

## 2023-12-17 DIAGNOSIS — J189 Pneumonia, unspecified organism: Principal | ICD-10-CM | POA: Diagnosis present

## 2023-12-17 DIAGNOSIS — J439 Emphysema, unspecified: Secondary | ICD-10-CM | POA: Diagnosis present

## 2023-12-17 LAB — HEPATIC FUNCTION PANEL
ALT: 9 U/L (ref 0–44)
AST: 19 U/L (ref 15–41)
Albumin: 4.1 g/dL (ref 3.5–5.0)
Alkaline Phosphatase: 99 U/L (ref 38–126)
Bilirubin, Direct: 0.2 mg/dL (ref 0.0–0.2)
Indirect Bilirubin: 0.4 mg/dL (ref 0.3–0.9)
Total Bilirubin: 0.7 mg/dL (ref 0.0–1.2)
Total Protein: 8.5 g/dL — ABNORMAL HIGH (ref 6.5–8.1)

## 2023-12-17 LAB — RESP PANEL BY RT-PCR (RSV, FLU A&B, COVID)  RVPGX2
Influenza A by PCR: NEGATIVE
Influenza B by PCR: NEGATIVE
Resp Syncytial Virus by PCR: NEGATIVE
SARS Coronavirus 2 by RT PCR: NEGATIVE

## 2023-12-17 LAB — BASIC METABOLIC PANEL WITH GFR
Anion gap: 10 (ref 5–15)
BUN: 20 mg/dL (ref 8–23)
CO2: 27 mmol/L (ref 22–32)
Calcium: 9.5 mg/dL (ref 8.9–10.3)
Chloride: 98 mmol/L (ref 98–111)
Creatinine, Ser: 1.19 mg/dL (ref 0.61–1.24)
GFR, Estimated: 58 mL/min — ABNORMAL LOW (ref >60)
Glucose, Bld: 131 mg/dL — ABNORMAL HIGH (ref 70–99)
Potassium: 4.6 mmol/L (ref 3.5–5.1)
Sodium: 135 mmol/L (ref 135–145)

## 2023-12-17 LAB — BLOOD GAS, VENOUS
Acid-Base Excess: 3.8 mmol/L — ABNORMAL HIGH (ref 0.0–2.0)
Bicarbonate: 29.2 mmol/L — ABNORMAL HIGH (ref 20.0–28.0)
O2 Saturation: 69 %
Patient temperature: 37
pCO2, Ven: 46 mmHg (ref 44–60)
pH, Ven: 7.41 (ref 7.25–7.43)
pO2, Ven: 40 mmHg (ref 32–45)

## 2023-12-17 LAB — CBC WITH DIFFERENTIAL/PLATELET
Abs Immature Granulocytes: 0.03 K/uL (ref 0.00–0.07)
Basophils Absolute: 0 K/uL (ref 0.0–0.1)
Basophils Relative: 0 %
Eosinophils Absolute: 0.2 K/uL (ref 0.0–0.5)
Eosinophils Relative: 2 %
HCT: 37 % — ABNORMAL LOW (ref 39.0–52.0)
Hemoglobin: 12.1 g/dL — ABNORMAL LOW (ref 13.0–17.0)
Immature Granulocytes: 0 %
Lymphocytes Relative: 12 %
Lymphs Abs: 0.9 K/uL (ref 0.7–4.0)
MCH: 31.7 pg (ref 26.0–34.0)
MCHC: 32.7 g/dL (ref 30.0–36.0)
MCV: 96.9 fL (ref 80.0–100.0)
Monocytes Absolute: 0.8 K/uL (ref 0.1–1.0)
Monocytes Relative: 11 %
Neutro Abs: 5.7 K/uL (ref 1.7–7.7)
Neutrophils Relative %: 75 %
Platelets: 163 K/uL (ref 150–400)
RBC: 3.82 MIL/uL — ABNORMAL LOW (ref 4.22–5.81)
RDW: 13.9 % (ref 11.5–15.5)
WBC: 7.6 K/uL (ref 4.0–10.5)
nRBC: 0 % (ref 0.0–0.2)

## 2023-12-17 LAB — PRO BRAIN NATRIURETIC PEPTIDE: Pro Brain Natriuretic Peptide: 427 pg/mL — ABNORMAL HIGH (ref <300.0)

## 2023-12-17 LAB — GLUCOSE, CAPILLARY: Glucose-Capillary: 179 mg/dL — ABNORMAL HIGH (ref 70–99)

## 2023-12-17 LAB — MAGNESIUM: Magnesium: 2.2 mg/dL (ref 1.7–2.4)

## 2023-12-17 MED ORDER — TERAZOSIN HCL 1 MG PO CAPS
1.0000 mg | ORAL_CAPSULE | Freq: Two times a day (BID) | ORAL | Status: DC
  Administered 2023-12-18 – 2023-12-20 (×5): 1 mg via ORAL
  Filled 2023-12-17 (×5): qty 1

## 2023-12-17 MED ORDER — PANTOPRAZOLE SODIUM 40 MG PO TBEC
40.0000 mg | DELAYED_RELEASE_TABLET | Freq: Two times a day (BID) | ORAL | Status: DC
  Administered 2023-12-18 – 2023-12-20 (×5): 40 mg via ORAL
  Filled 2023-12-17 (×5): qty 1

## 2023-12-17 MED ORDER — SODIUM CHLORIDE 0.9 % IV SOLN
2.0000 g | INTRAVENOUS | Status: DC
  Administered 2023-12-18 – 2023-12-19 (×2): 2 g via INTRAVENOUS
  Filled 2023-12-17 (×2): qty 20

## 2023-12-17 MED ORDER — METHYLPREDNISOLONE SODIUM SUCC 125 MG IJ SOLR
125.0000 mg | Freq: Once | INTRAMUSCULAR | Status: AC
  Administered 2023-12-17: 125 mg via INTRAVENOUS
  Filled 2023-12-17: qty 2

## 2023-12-17 MED ORDER — IPRATROPIUM-ALBUTEROL 0.5-2.5 (3) MG/3ML IN SOLN
3.0000 mL | Freq: Once | RESPIRATORY_TRACT | Status: AC
Start: 2023-12-17 — End: 2023-12-17
  Administered 2023-12-17: 3 mL via RESPIRATORY_TRACT
  Filled 2023-12-17: qty 3

## 2023-12-17 MED ORDER — SENNOSIDES-DOCUSATE SODIUM 8.6-50 MG PO TABS
1.0000 | ORAL_TABLET | Freq: Every evening | ORAL | Status: DC | PRN
  Administered 2023-12-18: 1 via ORAL
  Filled 2023-12-17: qty 1

## 2023-12-17 MED ORDER — ALBUTEROL SULFATE (2.5 MG/3ML) 0.083% IN NEBU
2.5000 mg | INHALATION_SOLUTION | Freq: Once | RESPIRATORY_TRACT | Status: AC
  Administered 2023-12-17: 2.5 mg via RESPIRATORY_TRACT
  Filled 2023-12-17: qty 3

## 2023-12-17 MED ORDER — ONDANSETRON HCL 4 MG/2ML IJ SOLN: 4.0000 mg | Freq: Four times a day (QID) | INTRAMUSCULAR | Status: DC | PRN

## 2023-12-17 MED ORDER — PRAVASTATIN SODIUM 20 MG PO TABS
20.0000 mg | ORAL_TABLET | Freq: Every day | ORAL | Status: DC
  Administered 2023-12-18 – 2023-12-20 (×3): 20 mg via ORAL
  Filled 2023-12-17 (×3): qty 1

## 2023-12-17 MED ORDER — SODIUM CHLORIDE 0.9% FLUSH
3.0000 mL | Freq: Two times a day (BID) | INTRAVENOUS | Status: DC
  Administered 2023-12-17 – 2023-12-19 (×5): 3 mL via INTRAVENOUS

## 2023-12-17 MED ORDER — ONDANSETRON HCL 4 MG PO TABS
4.0000 mg | ORAL_TABLET | Freq: Four times a day (QID) | ORAL | Status: DC | PRN
Start: 2023-12-17 — End: 2023-12-20

## 2023-12-17 MED ORDER — IPRATROPIUM-ALBUTEROL 0.5-2.5 (3) MG/3ML IN SOLN: 3.0000 mL | Freq: Four times a day (QID) | RESPIRATORY_TRACT | Status: DC | PRN

## 2023-12-17 MED ORDER — GUAIFENESIN ER 600 MG PO TB12
600.0000 mg | ORAL_TABLET | Freq: Two times a day (BID) | ORAL | Status: DC
  Administered 2023-12-17 – 2023-12-20 (×6): 600 mg via ORAL
  Filled 2023-12-17 (×6): qty 1

## 2023-12-17 MED ORDER — INSULIN ASPART 100 UNIT/ML IJ SOLN: 0.0000 [IU] | Freq: Every day | INTRAMUSCULAR | Status: DC

## 2023-12-17 MED ORDER — IOHEXOL 350 MG/ML SOLN
75.0000 mL | Freq: Once | INTRAVENOUS | Status: AC | PRN
  Administered 2023-12-17: 75 mL via INTRAVENOUS

## 2023-12-17 MED ORDER — BUDESONIDE 0.25 MG/2ML IN SUSP
0.2500 mg | Freq: Two times a day (BID) | RESPIRATORY_TRACT | Status: DC
  Administered 2023-12-17 – 2023-12-20 (×6): 0.25 mg via RESPIRATORY_TRACT
  Filled 2023-12-17 (×6): qty 2

## 2023-12-17 MED ORDER — ARFORMOTEROL TARTRATE 15 MCG/2ML IN NEBU
15.0000 ug | INHALATION_SOLUTION | Freq: Two times a day (BID) | RESPIRATORY_TRACT | Status: DC
  Administered 2023-12-17 – 2023-12-20 (×6): 15 ug via RESPIRATORY_TRACT
  Filled 2023-12-17 (×6): qty 2

## 2023-12-17 MED ORDER — ACETAMINOPHEN 325 MG PO TABS
650.0000 mg | ORAL_TABLET | Freq: Four times a day (QID) | ORAL | Status: DC | PRN
  Administered 2023-12-18: 650 mg via ORAL
  Filled 2023-12-17: qty 2

## 2023-12-17 MED ORDER — METHYLPREDNISOLONE SODIUM SUCC 40 MG IJ SOLR
40.0000 mg | Freq: Two times a day (BID) | INTRAMUSCULAR | Status: DC
  Administered 2023-12-18 – 2023-12-20 (×5): 40 mg via INTRAVENOUS
  Filled 2023-12-17 (×5): qty 1

## 2023-12-17 MED ORDER — SODIUM CHLORIDE 0.9 % IV SOLN
500.0000 mg | Freq: Once | INTRAVENOUS | Status: AC
  Administered 2023-12-17: 500 mg via INTRAVENOUS
  Filled 2023-12-17: qty 5

## 2023-12-17 MED ORDER — SODIUM CHLORIDE 0.9 % IV SOLN
500.0000 mg | INTRAVENOUS | Status: DC
  Administered 2023-12-18 – 2023-12-19 (×2): 500 mg via INTRAVENOUS
  Filled 2023-12-17 (×2): qty 5

## 2023-12-17 MED ORDER — INSULIN ASPART 100 UNIT/ML IJ SOLN
0.0000 [IU] | Freq: Three times a day (TID) | INTRAMUSCULAR | Status: DC
  Administered 2023-12-18 – 2023-12-19 (×2): 3 [IU] via SUBCUTANEOUS
  Administered 2023-12-19 – 2023-12-20 (×2): 2 [IU] via SUBCUTANEOUS
  Filled 2023-12-17: qty 2
  Filled 2023-12-17 (×2): qty 3
  Filled 2023-12-17: qty 2
  Filled 2023-12-17: qty 3

## 2023-12-17 MED ORDER — ACETAMINOPHEN 650 MG RE SUPP: 650.0000 mg | Freq: Four times a day (QID) | RECTAL | Status: DC | PRN

## 2023-12-17 MED ORDER — AMLODIPINE BESYLATE 5 MG PO TABS
5.0000 mg | ORAL_TABLET | Freq: Every day | ORAL | Status: DC
  Administered 2023-12-18 – 2023-12-20 (×3): 5 mg via ORAL
  Filled 2023-12-17 (×3): qty 1

## 2023-12-17 MED ORDER — PRIMIDONE 50 MG PO TABS
50.0000 mg | ORAL_TABLET | Freq: Two times a day (BID) | ORAL | Status: DC
  Administered 2023-12-18 – 2023-12-20 (×5): 50 mg via ORAL
  Filled 2023-12-17 (×5): qty 1

## 2023-12-17 MED ORDER — ENOXAPARIN SODIUM 40 MG/0.4ML IJ SOSY
40.0000 mg | PREFILLED_SYRINGE | INTRAMUSCULAR | Status: DC
  Administered 2023-12-17 – 2023-12-19 (×3): 40 mg via SUBCUTANEOUS
  Filled 2023-12-17 (×3): qty 0.4

## 2023-12-17 MED ORDER — SODIUM CHLORIDE 0.9 % IV SOLN
1.0000 g | Freq: Once | INTRAVENOUS | Status: AC
  Administered 2023-12-17: 1 g via INTRAVENOUS
  Filled 2023-12-17: qty 10

## 2023-12-17 NOTE — Telephone Encounter (Signed)
 FYI Only or Action Required?: FYI only for provider: ED advised and recommended to contact clinical trial to update with symptoms.  Patient is followed in Pulmonology for COPD, last seen on 11/27/2023 by Mannam, Praveen, MD.  Called Nurse Triage reporting Shortness of Breath.  Symptoms began 12/13/2023.  Interventions attempted: Rescue inhaler, Maintenance inhaler, and Home oxygen  use.  Symptoms are: gradually worsening.  Triage Disposition: Go to ED Now (Notify PCP)  Patient/caregiver understands and will follow disposition?: Yes  E2C2 Pulmonary Triage - Initial Assessment Questions "Chief Complaint (e.g., cough, sob, wheezing, fever, chills, sweat or additional symptoms) *Go to specific symptom protocol after initial questions. Patient reports he was involved in a clinical research program on 12/12/2023. Patient states he responded to an ad from [facility] in [place]. Patient did endorse that he didn't speak to pulmonary provider about participating. Patient states the trial involved medication for COPD he thinks.  Patient reports shortness of breath, coughing, congestion, cold like symptoms and fatigue. Patient states he was given three injections on 12/12/2023. Symptoms started on 12/13/2023. Temperature 99.3. reports chest tightness as well. Patient is recommended to call the clinical trial program as well as going to the Emergency Department. Patient verbalized understanding and all questions answered.   "How long have symptoms been present?" Started on 12/13/2023  Have you tested for COVID or Flu? Note: If not, ask patient if a home test can be taken. If so, instruct patient to call back for positive results. No  MEDICINES:   "Have you used any OTC meds to help with symptoms?" No If yes, ask "What medications?"   "Have you used your inhalers/maintenance medication?" Yes If yes, "What medications?" Albuterol  inhaler Trelegy  If inhaler, ask "How many puffs and how  often?" Note: Review instructions on medication in the chart. Albuterol  inhaler 2 puffs Q6H Trelegy 1 puff Daily  OXYGEN : "Do you wear supplemental oxygen ?" Yes If yes, "How many liters are you supposed to use?" 2L  "Do you monitor your oxygen  levels?" Yes If yes, What is your reading (oxygen  level) today? 90% on 2L  What is your usual oxygen  saturation reading?  (Note: Pulmonary O2 sats should be 90% or greater) Low 90s   Copied from CRM #[id number]. Topic: Clinical - Red Word Triage >> Dec 17, 2023  3:01 PM Robinson H wrote: [facility] that prompted transfer to Nurse Triage: Participated in a clinical research program and having some side effects, having congestion, cold like symptoms, extreme fatigue, has COPD and automatically short of breath, coughing on phone with agent and got short of breath Reason for Disposition  [1] MODERATE difficulty breathing (e.g., speaks in phrases, SOB even at rest, pulse 100-120) AND [2] NEW-onset or WORSE than normal  Answer Assessment - Initial Assessment Questions 6. CARDIAC HISTORY: Do you have any history of heart disease? (e.g., heart attack, angina, bypass surgery, angioplasty)      yes 7. LUNG HISTORY: Do you have any history of lung disease?  (e.g., pulmonary embolus, asthma, emphysema)     yes  12. TRAVEL: Have you traveled out of the country in the last month? (e.g., travel history, exposures)       no  Protocols used: Breathing Difficulty-A-AH

## 2023-12-17 NOTE — Hospital Course (Signed)
 Johnathan [name] is a 89 y.o. male with medical history significant for COPD, chronic respiratory failure with hypoxia on 2 L O2 [place], T2DM, HTN, HLD, BPH who is admitted with acute on chronic respiratory failure with hypoxia secondary to COPD exacerbation and RLL PNA.

## 2023-12-17 NOTE — ED Triage Notes (Signed)
 Pt arrives to triage via wheelchair on portable oxygen . Pt has complaints of nasal congestion, and generalized weakness that has persisted for several days. During triage pt noted to be at 85% on his home oxygen . Pt reports that his baseline sat is 90%.

## 2023-12-17 NOTE — ED Notes (Signed)
 X-ray at bedside.

## 2023-12-17 NOTE — H&P (Signed)
 History and Physical    Johnathan [name] FMW:[id number] DOB: Aug 01, 1932 DOA: 12/17/2023  PCP: Watt Harlene BROCKS, MD  Patient coming from: Home  I have personally briefly reviewed patient's old medical records in [facility]  Chief Complaint: Shortness of breath  HPI: Johnathan [name] is a 89 y.o. male with medical history significant for COPD, chronic respiratory failure with hypoxia on 2 L O2 [place], T2DM, HTN, HLD, BPH who presented to the ED for evaluation of shortness of breath.  Patient reports about 5 days of worsening shortness of breath compared to his baseline.  He has a chronic cough that is normally nonproductive but he reports new production of brown-colored sputum.  He has not had any fevers, chills, diaphoresis.  He reports wearing 2 L supplemental O2 via [place] 24/7.  He is enrolled in a research study and on 12/12/2023 he received the study treatment versus placebo injection.  ED Course  Labs/Imaging on admission: I have personally reviewed following labs and imaging studies.  Initial vitals showed BP 142/61, pulse 97, RR 20, temp 99.8 F, SpO2 83% on 2 L O2 [place].  Labs showed WBC 7.6, hemoglobin 12.1, platelets 163, magnesium  2.2, sodium 135, potassium 4.6, bicarb 27, BUN 20, creatinine 1.19, serum glucose 131, proBNP 427, LFTs within normal limits.  SARS-CoV-2, influenza, RSV PCR negative.  CTA chest negative for PE.  Findings concerning for pneumonia in the right lower lobe and possibly superimposed on scarring in the posterior inferior right upper lobe.  Advanced emphysema, diffuse CAD, aortic atherosclerosis, cholelithiasis also noted.  Patient was given IV Solu-Medrol  125 mg, IV ceftriaxone  and azithromycin , DuoNeb and albuterol  nebulizer treatment.  The hospitalist service was consulted for admission.  Review of Systems: All systems reviewed and are negative except as documented in history of present illness above.   Past Medical History:  Diagnosis Date   Anal fissure     COPD (chronic obstructive pulmonary disease) (HCC)    Diverticulosis    DM type 2 (diabetes mellitus, type 2) (HCC)    GERD (gastroesophageal reflux disease)    uses prilosec    Hypertension    Pneumonia 2018   Raynaud's disease    Shortness of breath dyspnea    WITH EXERTION   Sinusitis    Tremors of nervous system     Past Surgical History:  Procedure Laterality Date   COLONOSCOPY  03/27/2005   ESOPHAGOGASTRODUODENOSCOPY (EGD) WITH PROPOFOL  N/A 02/08/2021   Procedure: ESOPHAGOGASTRODUODENOSCOPY (EGD) WITH PROPOFOL ;  Surgeon: Teressa Toribio SQUIBB, MD;  Location: [facility];  Service: Endoscopy;  Laterality: N/A;   HERNIA REPAIR     INGUINAL HERNIA REPAIR N/A 05/06/2019   Procedure: LAPAROSCOPIC RIGHT AND LEFT INGUINAL HERNIA(S) REPAIR;  Surgeon: Sheldon Standing, MD;  Location: [facility];  Service: General;  Laterality: N/A;    Social History: Social History   Tobacco Use   Smoking status: Former    Current packs/day: 0.00    Average packs/day: 1 pack/day for 45.0 years (45.0 ttl pk-yrs)    Types: Cigarettes    Start date: 01/21/1949    Quit date: 01/21/1994    Years since quitting: 29.9   Smokeless tobacco: Never  Substance Use Topics   Alcohol use: Yes    Comment: a drink daily with dinner   Drug use: No   No Known Allergies  Family History  Problem Relation Age of Onset   Dementia Brother    Diabetes Brother    Heart attack Father  Dementia Father      Prior to Admission medications   Medication Sig Start Date End Date Taking? Authorizing Provider  amLODipine  (NORVASC ) 5 MG tablet Take 1 tablet (5 mg total) by mouth daily. 10/29/23   Copland, Harlene BROCKS, MD  aspirin  EC 81 MG tablet Take 81 mg by mouth every morning.    [provider]  B Complex-C (SUPER B COMPLEX PO) Take 1 tablet by mouth at bedtime.    [provider]  benzonatate  (TESSALON ) 100 MG capsule Take 1 capsule (100 mg total) by mouth 2 (two) times daily as needed for  cough. Patient not taking: Reported on 11/27/2023 03/19/23   Almarie Birmingham B, NP  cetirizine  (ZYRTEC ) 5 MG tablet Take 1 tablet (5 mg total) by mouth at bedtime. 03/19/23   Almarie Birmingham NOVAK, NP  Flaxseed, Linseed, (FLAXSEED OIL) 1200 MG CAPS Take 1,200 mg by mouth at bedtime.    [provider]  Fluticasone -Umeclidin-Vilant (TRELEGY ELLIPTA ) 100-62.5-25 MCG/ACT AEPB Inhale 1 puff into the lungs daily. Rinse mouth after use 08/06/23   Copland, Jessica C, MD  Ginkgo Biloba EXTR Take 1 capsule by mouth 2 (two) times daily.    [provider]  guaiFENesin  (MUCINEX ) 600 MG 12 hr tablet Take 2 tablets (1,200 mg total) by mouth 2 (two) times daily. Patient not taking: Reported on 11/27/2023 09/14/21   Cheryle Page, MD  ketoconazole  (NIZORAL ) 2 % cream Apply 1 Application topically daily. Use as needed for fungal infection Patient not taking: Reported on 11/27/2023 01/23/22   Copland, Harlene BROCKS, MD  ketoconazole  (NIZORAL ) 2 % shampoo Apply 1 Application topically 2 (two) times a week. Use as needed for itchy scalp Patient not taking: Reported on 11/27/2023 09/12/22   Copland, Harlene BROCKS, MD  meloxicam  (MOBIC ) 15 MG tablet TAKE 1 TABLET BY MOUTH IN THE MORNING 12/08/23   Copland, Harlene BROCKS, MD  metFORMIN  (GLUCOPHAGE ) 500 MG tablet Take 0.5 tablets (250 mg total) by mouth daily. 10/29/23   Copland, Jessica C, MD  Misc Natural Products (HIMALAYAN GOJI PO) Take 6 Doses by mouth daily. Goji berries    [provider]  Multiple Vitamin (MULTIVITAMIN WITH MINERALS) TABS tablet Take 1 tablet by mouth daily with breakfast.    [provider]  MYRBETRIQ  50 MG TB24 tablet Take 1 tablet (50 mg total) by mouth daily. 10/29/23   Copland, Harlene BROCKS, MD  omeprazole  (PRILOSEC) 20 MG capsule Take 1 capsule (20 mg total) by mouth 2 (two) times daily. 10/29/23   Copland, Harlene BROCKS, MD  OXYGEN  Inhale 2 L/min into the lungs continuous.    [provider]  pravastatin  (PRAVACHOL ) 40 MG tablet Take  0.5 tablets (20 mg total) by mouth daily. 10/29/23   Copland, Harlene BROCKS, MD  primidone  (MYSOLINE ) 50 MG tablet Take 1 tablet (50 mg total) by mouth 2 (two) times daily. 08/18/23   Copland, Jessica C, MD  SYSTANE COMPLETE PF 0.6 % SOLN Place 1 drop into the right eye in the morning, at noon, and at bedtime.    [provider]  tadalafil  (CIALIS ) 20 MG tablet TAKE 1/2 TO 1 (ONE-HALF TO ONE) TABLET BY MOUTH AS NEEDED 2 HOURS PRIOR TO SEXUAL ACTIVITY 10/29/23   Copland, Harlene BROCKS, MD  terazosin  (HYTRIN ) 1 MG capsule Take 1 capsule by mouth twice daily 12/01/23   Copland, Jessica C, MD  traMADol  (ULTRAM ) 50 MG tablet Take 1 tablet (50 mg total) by mouth every 12 (twelve) hours as needed (for back  pain). 11/05/22   Copland, Harlene BROCKS, MD  triamcinolone  cream (KENALOG ) 0.1 % Apply 1 Application topically 2 (two) times daily as needed (rash/itching). 09/14/21   Cheryle Page, MD  VENTOLIN  HFA 108 (90 Base) MCG/ACT inhaler Inhale 2 puffs into the lungs every 6 (six) hours as needed for wheezing or shortness of breath. 06/04/23   Copland, Harlene BROCKS, MD  VITAMIN E PO Take 1 capsule by mouth daily with breakfast.    [provider]    Physical Exam: Vitals:   12/17/23 1746 12/17/23 1920 12/17/23 2000  BP: (!) 142/61 (!) 141/80 (!) 146/73  Pulse: 97 87 94  Resp: 20 16 (!) 21  Temp: 99.8 F (37.7 C) 98.7 F (37.1 C)   TempSrc: Oral    SpO2: (!) 83% (!) 87% (!) 87%  Weight: 68 kg    Height: 5' 9 (1.753 m)     Constitutional: Thin man sitting up in bed.  NAD, calm, comfortable Eyes: EOMI, lids and conjunctivae normal ENMT: Mucous membranes are moist. Posterior pharynx clear of any exudate or lesions.Normal dentition.  Neck: normal, supple, no masses. Respiratory: Distant breath sounds with bilateral end expiratory wheezing and inspiratory crackles at the right lung base. Normal respiratory effort while on 5 L O2 via [place]. No accessory muscle use.  Cardiovascular: Regular rate and rhythm,  no murmurs / rubs / gallops. No extremity edema. 2+ pedal pulses. Abdomen: no tenderness, no masses palpated. Musculoskeletal: no clubbing / cyanosis. No joint deformity upper and lower extremities. Good ROM, no contractures. Normal muscle tone.  Skin: no rashes, lesions, ulcers. No induration Neurologic: Sensation intact. Strength 5/5 in all 4.  Psychiatric: Normal judgment and insight. Alert and oriented x 3. Normal mood.   EKG: Ordered and pending.  Assessment/Plan Principal Problem:   Acute on chronic respiratory failure with hypoxia (HCC) Active Problems:   COPD with acute exacerbation (HCC)   Community acquired pneumonia of right lower lobe of lung   Type 2 diabetes mellitus (HCC)   Hypertension associated with diabetes (HCC)   Hyperlipidemia associated with type 2 diabetes mellitus (HCC)   Johnathan [name] is a 89 y.o. male with medical history significant for COPD, chronic respiratory failure with hypoxia on 2 L O2 [place], T2DM, HTN, HLD, BPH who is admitted with acute on chronic respiratory failure with hypoxia secondary to COPD exacerbation and RLL PNA.  Assessment and Plan: Acute on chronic respiratory failure with hypoxia COPD with acute exacerbation Community acquired pneumonia of right lower lobe: Patient uses 2 L O2 via [place] at all times at home.  SpO2 was 85% on home O2 and he has been requiring between 3-5 L O2  on admission.  Symptoms are secondary to COPD exacerbation and RLL PNA. - IV Solu-Medrol  40 mg twice daily - Brovana /Pulmicort  twice daily - DuoNebs as needed - Continue IV ceftriaxone  and azithromycin  - IS, FV, Mucinex  - Check strep pneumonia and Legionella urine antigens - Continue supplemental O2 and wean to home 2 L via  as able  Type 2 diabetes: Holding metformin  and placed on SSI.  Hypertension: Continue amlodipine .  Hyperlipidemia: Continue pravastatin .  BPH: Continue terazosin .   DVT prophylaxis: enoxaparin  (LOVENOX ) injection 40 mg Start:  12/17/23 2200 Code Status:   Code Status: Do not attempt resuscitation (DNR) PRE-ARREST INTERVENTIONS DESIRED confirmed with patient on admission. Family Communication: Roommate at bedside Disposition Plan: From home, dispo pending clinical progress Consults called: None Severity of Illness: The appropriate patient status for this patient is INPATIENT.  Inpatient status is judged to be reasonable and necessary in order to provide the required intensity of service to ensure the patient's safety. The patient's presenting symptoms, physical exam findings, and initial radiographic and laboratory data in the context of their chronic comorbidities is felt to place them at high risk for further clinical deterioration. Furthermore, it is not anticipated that the patient will be medically stable for discharge from the hospital within 2 midnights of admission.   * I certify that at the point of admission it is my clinical judgment that the patient will require inpatient hospital care spanning beyond 2 midnights from the point of admission due to high intensity of service, high risk for further deterioration and high frequency of surveillance required.DEWAINE Jorie Blanch MD [facility]  If 7PM-7AM, please contact night-coverage [email or web address]  12/17/2023, 9:50 PM

## 2023-12-17 NOTE — ED Provider Notes (Signed)
 [place] EMERGENCY DEPARTMENT AT [facility] Provider Note   CSN: [id number] Arrival date & time: 12/17/23  1735     Patient presents with: Weakness and Nasal Congestion   Johnathan [name] is a 89 y.o. male.    Weakness Associated symptoms: cough and shortness of breath   Patient presents for cough and shortness of breath.  Medical history includes DM, COPD, HTN, GERD, HLD.  At baseline, he wears 2 L of oxygen  24 hours/day.  Over the past 3 days, he has had shortness of breath that is worse with any exertion, cough that has been productive of green/brown sputum, fatigue.  Typically, he will need 2 breathing treatments per day.  He is on daily inhalers.    Prior to Admission medications   Medication Sig Start Date End Date Taking? Authorizing Provider  amLODipine  (NORVASC ) 5 MG tablet Take 1 tablet (5 mg total) by mouth daily. 10/29/23   Copland, Harlene BROCKS, MD  aspirin  EC 81 MG tablet Take 81 mg by mouth every morning.    [provider]  B Complex-C (SUPER B COMPLEX PO) Take 1 tablet by mouth at bedtime.    [provider]  benzonatate  (TESSALON ) 100 MG capsule Take 1 capsule (100 mg total) by mouth 2 (two) times daily as needed for cough. Patient not taking: Reported on 11/27/2023 03/19/23   Almarie Birmingham B, NP  cetirizine  (ZYRTEC ) 5 MG tablet Take 1 tablet (5 mg total) by mouth at bedtime. 03/19/23   Almarie Birmingham NOVAK, NP  Flaxseed, Linseed, (FLAXSEED OIL) 1200 MG CAPS Take 1,200 mg by mouth at bedtime.    [provider]  Fluticasone -Umeclidin-Vilant (TRELEGY ELLIPTA ) 100-62.5-25 MCG/ACT AEPB Inhale 1 puff into the lungs daily. Rinse mouth after use 08/06/23   Copland, Jessica C, MD  Ginkgo Biloba EXTR Take 1 capsule by mouth 2 (two) times daily.    [provider]  guaiFENesin  (MUCINEX ) 600 MG 12 hr tablet Take 2 tablets (1,200 mg total) by mouth 2 (two) times daily. Patient not taking: Reported on 11/27/2023 09/14/21   Cheryle Page, MD   ketoconazole  (NIZORAL ) 2 % cream Apply 1 Application topically daily. Use as needed for fungal infection Patient not taking: Reported on 11/27/2023 01/23/22   Copland, Harlene BROCKS, MD  ketoconazole  (NIZORAL ) 2 % shampoo Apply 1 Application topically 2 (two) times a week. Use as needed for itchy scalp Patient not taking: Reported on 11/27/2023 09/12/22   Copland, Harlene BROCKS, MD  meloxicam  (MOBIC ) 15 MG tablet TAKE 1 TABLET BY MOUTH IN THE MORNING 12/08/23   Copland, Harlene BROCKS, MD  metFORMIN  (GLUCOPHAGE ) 500 MG tablet Take 0.5 tablets (250 mg total) by mouth daily. 10/29/23   Copland, Jessica C, MD  Misc Natural Products (HIMALAYAN GOJI PO) Take 6 Doses by mouth daily. Goji berries    [provider]  Multiple Vitamin (MULTIVITAMIN WITH MINERALS) TABS tablet Take 1 tablet by mouth daily with breakfast.    [provider]  MYRBETRIQ  50 MG TB24 tablet Take 1 tablet (50 mg total) by mouth daily. 10/29/23   Copland, Harlene BROCKS, MD  omeprazole  (PRILOSEC) 20 MG capsule Take 1 capsule (20 mg total) by mouth 2 (two) times daily. 10/29/23   Copland, Harlene BROCKS, MD  OXYGEN  Inhale 2 L/min into the lungs continuous.    [provider]  pravastatin  (PRAVACHOL ) 40 MG tablet Take 0.5 tablets (20 mg total) by mouth daily. 10/29/23   Copland, Harlene BROCKS, MD  primidone  (MYSOLINE ) 50 MG  tablet Take 1 tablet (50 mg total) by mouth 2 (two) times daily. 08/18/23   Copland, Jessica C, MD  SYSTANE COMPLETE PF 0.6 % SOLN Place 1 drop into the right eye in the morning, at noon, and at bedtime.    [provider]  tadalafil  (CIALIS ) 20 MG tablet TAKE 1/2 TO 1 (ONE-HALF TO ONE) TABLET BY MOUTH AS NEEDED 2 HOURS PRIOR TO SEXUAL ACTIVITY 10/29/23   Copland, Harlene BROCKS, MD  terazosin  (HYTRIN ) 1 MG capsule Take 1 capsule by mouth twice daily 12/01/23   Copland, Jessica C, MD  traMADol  (ULTRAM ) 50 MG tablet Take 1 tablet (50 mg total) by mouth every 12 (twelve) hours as needed (for back pain). 11/05/22   Copland,  Harlene BROCKS, MD  triamcinolone  cream (KENALOG ) 0.1 % Apply 1 Application topically 2 (two) times daily as needed (rash/itching). 09/14/21   Cheryle Page, MD  VENTOLIN  HFA 108 (90 Base) MCG/ACT inhaler Inhale 2 puffs into the lungs every 6 (six) hours as needed for wheezing or shortness of breath. 06/04/23   Copland, Jessica C, MD  VITAMIN E PO Take 1 capsule by mouth daily with breakfast.    [provider]    Allergies: Patient has no known allergies.    Review of Systems  Constitutional:  Positive for fatigue.  Respiratory:  Positive for cough and shortness of breath.   Neurological:  Positive for weakness.  All other systems reviewed and are negative.   Updated Vital Signs BP (!) 146/73   Pulse 94   Temp 98.7 F (37.1 C)   Resp (!) 21   Ht 5' 9 (1.753 m)   Wt 68 kg   SpO2 (!) 87%   BMI 22.15 kg/m   Physical Exam Vitals and nursing note reviewed.  Constitutional:      General: He is not in acute distress.    Appearance: Normal appearance. He is well-developed. He is not toxic-appearing or diaphoretic.  HENT:     Head: Normocephalic and atraumatic.     Right Ear: External ear normal.     Left Ear: External ear normal.     Mouth/Throat:     Mouth: Mucous membranes are moist.  Eyes:     Extraocular Movements: Extraocular movements intact.     Conjunctiva/sclera: Conjunctivae normal.  Cardiovascular:     Rate and Rhythm: Normal rate and regular rhythm.     Heart sounds: No murmur heard. Pulmonary:     Effort: Tachypnea present. No respiratory distress.     Breath sounds: Decreased breath sounds present.  Abdominal:     General: There is no distension.     Palpations: Abdomen is soft.     Tenderness: There is no abdominal tenderness.  Musculoskeletal:        General: No swelling. Normal range of motion.     Cervical back: Normal range of motion and neck supple.  Skin:    General: Skin is warm and dry.     Coloration: Skin is not jaundiced or pale.   Neurological:     General: No focal deficit present.     Mental Status: He is alert and oriented to person, place, and time.  Psychiatric:        Mood and Affect: Mood normal.        Behavior: Behavior normal.     (all labs ordered are listed, but only abnormal results are displayed) Labs Reviewed  PRO BRAIN NATRIURETIC PEPTIDE - Abnormal; Notable for the following components:  Result Value   Pro Brain Natriuretic Peptide 427.0 (*)    All other components within normal limits  BASIC METABOLIC PANEL WITH GFR - Abnormal; Notable for the following components:   Glucose, Bld 131 (*)    GFR, Estimated 58 (*)    All other components within normal limits  HEPATIC FUNCTION PANEL - Abnormal; Notable for the following components:   Total Protein 8.5 (*)    All other components within normal limits  BLOOD GAS, VENOUS - Abnormal; Notable for the following components:   Bicarbonate 29.2 (*)    Acid-Base Excess 3.8 (*)    All other components within normal limits  CBC WITH DIFFERENTIAL/PLATELET - Abnormal; Notable for the following components:   RBC 3.82 (*)    Hemoglobin 12.1 (*)    HCT 37.0 (*)    All other components within normal limits  RESP PANEL BY RT-PCR (RSV, FLU A&B, COVID)  RVPGX2  MAGNESIUM     EKG: None  Radiology: CT Angio Chest PE W and/or Wo Contrast Result Date: 12/17/2023 EXAM: CTA CHEST 12/17/2023 08:38:44 PM TECHNIQUE: CTA of the chest was performed without and with the administration of 75 mL of iohexol  (OMNIPAQUE ) 350 MG/ML injection. Multiplanar reformatted images are provided for review. MIP images are provided for review. Automated exposure control, iterative reconstruction, and/or weight based adjustment of the mA/kV was utilized to reduce the radiation dose to as low as reasonably achievable. COMPARISON: 10/29/2021 CLINICAL HISTORY: Pulmonary embolism (PE) suspected, high prob. FINDINGS: PULMONARY ARTERIES: Pulmonary arteries are adequately opacified for  evaluation. No acute pulmonary embolus. Main pulmonary artery is normal in caliber. MEDIASTINUM: Diffuse coronary artery and moderate aortic calcifications. The heart and pericardium demonstrate no acute abnormality. There is no acute abnormality of the thoracic aorta. LYMPH NODES: No mediastinal, hilar or axillary lymphadenopathy. LUNGS AND PLEURA: Advanced emphysema. Scarring in the inferior right upper lobe. Consolidation in the posterior right lower lobe, and possibly superimposed on scarring in the posterior inferior right upper lobe concerning for pneumonia. No evidence of pleural effusion or pneumothorax. UPPER ABDOMEN: Small layering gallstones within the gallbladder. SOFT TISSUES AND BONES: No acute bone or soft tissue abnormality. IMPRESSION: 1. No pulmonary embolism. 2. Findings concerning for pneumonia in the right lower lobe and possibly superimposed on scarring in the posterior inferior right upper lobe. 3. Advanced emphysema. 4. Diffuse coronary artery disease. Aortic atherosclerosis. 5. Cholelithiasis. Electronically signed by: Franky Crease MD 12/17/2023 08:43 PM EST RP Workstation: [id number]   DG Chest Port 1 View Result Date: 12/17/2023 EXAM: 1 VIEW(S) XRAY OF THE CHEST 12/17/2023 06:28:00 PM COMPARISON: 11/20/2022 CLINICAL HISTORY: Shortness of breath. FINDINGS: LUNGS AND PLEURA: Linear densities in the lung bases could reflect scarring or atelectasis. Patchy ill-defined right mid lung opacity. No pleural effusion. No pneumothorax. HEART AND MEDIASTINUM: Tortuous aorta. Aortic atherosclerosis. BONES AND SOFT TISSUES: No acute osseous abnormality. IMPRESSION: 1. Patchy ill-defined right mid lung opacity, which may reflect infection or inflammation. 2. Linear basilar opacities likely representing atelectasis or scarring. Electronically signed by: Franky Crease MD 12/17/2023 07:12 PM EST RP Workstation: [id number]     Procedures   Medications Ordered in the ED  azithromycin  (ZITHROMAX ) 500  mg in sodium chloride  0.9 % 250 mL IVPB (500 mg Intravenous New Bag/Given 12/17/23 2014)  albuterol  (PROVENTIL ) (2.5 MG/3ML) 0.083% nebulizer solution 2.5 mg (has no administration in time range)  methylPREDNISolone  sodium succinate (SOLU-MEDROL ) 125 mg/2 mL injection 125 mg (125 mg Intravenous Given 12/17/23 1920)  ipratropium-albuterol  (DUONEB) 0.5-2.5 (3) MG/3ML nebulizer solution  3 mL (3 mLs Nebulization Given 12/17/23 1920)  cefTRIAXone  (ROCEPHIN ) 1 g in sodium chloride  0.9 % 100 mL IVPB (0 g Intravenous Stopped 12/17/23 2007)  iohexol  (OMNIPAQUE ) 350 MG/ML injection 75 mL (75 mLs Intravenous Contrast Given 12/17/23 2031)                                    Medical Decision Making Amount and/or Complexity of Data Reviewed Labs: ordered. Radiology: ordered.  Risk Prescription drug management.   This patient presents to the ED for concern of cough and shortness of breath, this involves an extensive number of treatment options, and is a complaint that carries with it a high risk of complications and morbidity.  The differential diagnosis includes COPD exacerbation, pneumonia, CHF, PE, URI   Co morbidities / Chronic conditions that complicate the patient evaluation  DM, COPD, HTN, GERD, HLD   Additional history obtained:  Additional history obtained from EMR External records from outside source obtained and reviewed including N/A   Lab Tests:  I Ordered, and personally interpreted labs.  The pertinent results include: Hemoglobin, no leukocytosis, normal kidney function, normal electrolytes.  Moderate elevation in BNP   Imaging Studies ordered:  I ordered imaging studies including chest x-ray, CTA chest I independently visualized and interpreted imaging which showed right lower lobe pneumonia I agree with the radiologist interpretation   Cardiac Monitoring: / EKG:  The patient was maintained on a cardiac monitor.  I personally viewed and interpreted the cardiac  monitored which showed an underlying rhythm of: Sinus rhythm   Problem List / ED Course / Critical interventions / Medication management  Patient presenting for 3 days of worsening shortness of breath, productive cough, fatigue, exercise intolerance.  At baseline he is on 2 L of supplemental oxygen .  In the ED, he was found to be hypoxic on his baseline 2 L.  Supplemental oxygen  was increased to 3 L.  On exam, patient has mildly increased work of breathing.  He is able to speak in complete sentences.  He has diminished breath sounds on lung auscultation.  Steroids and breathing treatment ordered.  Given his increased sputum production, antibiotics ordered.  Workup initiated.  Lab work was unremarkable.  Imaging study showed evidence of right lower lobe pneumonia.  On reassessment, patient's work of breathing has improved.  He continues to have increased oxygen  requirement.  On 5 L, SpO2 is 88%.  Additional breathing treatment ordered.  Patient was admitted for further management. I ordered medication including Solu-Medrol , DuoNeb, albuterol  for COPD; ceftriaxone  and azithromycin  for pneumonia Reevaluation of the patient after these medicines showed that the patient improved I have reviewed the patients home medicines and have made adjustments as needed  Social Determinants of Health:  Has PCP  CRITICAL CARE Performed by: Bernardino Fireman   Total critical care time: 32 minutes  Critical care time was exclusive of separately billable procedures and treating other patients.  Critical care was necessary to treat or prevent imminent or life-threatening deterioration.  Critical care was time spent personally by me on the following activities: development of treatment plan with patient and/or surrogate as well as nursing, discussions with consultants, evaluation of patient's response to treatment, examination of patient, obtaining history from patient or surrogate, ordering and performing treatments and  interventions, ordering and review of laboratory studies, ordering and review of radiographic studies, pulse oximetry and re-evaluation of patient's condition.  Final diagnoses:  Pneumonia of right lower lobe due to infectious organism  Acute on chronic respiratory failure with hypoxia [facility])    ED Discharge Orders     None          Melvenia Motto, MD 12/17/23 2052

## 2023-12-17 NOTE — ED Notes (Signed)
 Pt to CT

## 2023-12-17 NOTE — Progress Notes (Signed)
   12/17/23 2257  Chest Physiotherapy Tx  CPT Delivery Source Flutter valve  $ Flutter Administration Initial  $ Flutter Blue Yes  CPT Duration 5  CPT Chest Site Full range  Post-Treatment Pulse 95  Post-Treatment Respirations 20  Cough Strong;Congested  Position Supine  CPT Treatment Tolerance Tolerated well

## 2023-12-18 DIAGNOSIS — J9621 Acute and chronic respiratory failure with hypoxia: Secondary | ICD-10-CM | POA: Diagnosis not present

## 2023-12-18 LAB — BASIC METABOLIC PANEL WITH GFR
Anion gap: 11 (ref 5–15)
BUN: 23 mg/dL (ref 8–23)
CO2: 25 mmol/L (ref 22–32)
Calcium: 9.2 mg/dL (ref 8.9–10.3)
Chloride: 99 mmol/L (ref 98–111)
Creatinine, Ser: 1.19 mg/dL (ref 0.61–1.24)
GFR, Estimated: 58 mL/min — ABNORMAL LOW (ref >60)
Glucose, Bld: 231 mg/dL — ABNORMAL HIGH (ref 70–99)
Potassium: 4.7 mmol/L (ref 3.5–5.1)
Sodium: 135 mmol/L (ref 135–145)

## 2023-12-18 LAB — CBC
HCT: 35.6 % — ABNORMAL LOW (ref 39.0–52.0)
Hemoglobin: 11.5 g/dL — ABNORMAL LOW (ref 13.0–17.0)
MCH: 31.5 pg (ref 26.0–34.0)
MCHC: 32.3 g/dL (ref 30.0–36.0)
MCV: 97.5 fL (ref 80.0–100.0)
Platelets: 162 K/uL (ref 150–400)
RBC: 3.65 MIL/uL — ABNORMAL LOW (ref 4.22–5.81)
RDW: 13.8 % (ref 11.5–15.5)
WBC: 5.4 K/uL (ref 4.0–10.5)
nRBC: 0 % (ref 0.0–0.2)

## 2023-12-18 LAB — GLUCOSE, CAPILLARY
Glucose-Capillary: 141 mg/dL — ABNORMAL HIGH (ref 70–99)
Glucose-Capillary: 152 mg/dL — ABNORMAL HIGH (ref 70–99)

## 2023-12-18 LAB — STREP PNEUMONIAE URINARY ANTIGEN: Strep Pneumo Urinary Antigen: NEGATIVE

## 2023-12-18 MED ORDER — ALBUTEROL SULFATE (2.5 MG/3ML) 0.083% IN NEBU: 2.5000 mg | INHALATION_SOLUTION | RESPIRATORY_TRACT | Status: DC | PRN

## 2023-12-18 NOTE — Plan of Care (Signed)
  Problem: Education: Goal: Ability to describe self-care measures that may prevent or decrease complications (Diabetes Survival Skills Education) will improve Outcome: Progressing Goal: Individualized Educational Video(s) Outcome: Progressing   Problem: Coping: Goal: Ability to adjust to condition or change in health will improve Outcome: Progressing   Problem: Fluid Volume: Goal: Ability to maintain a balanced intake and output will improve Outcome: Progressing   Problem: Health Behavior/Discharge Planning: Goal: Ability to identify and utilize available resources and services will improve Outcome: Progressing Goal: Ability to manage health-related needs will improve Outcome: Progressing   Problem: Metabolic: Goal: Ability to maintain appropriate glucose levels will improve Outcome: Progressing   Problem: Nutritional: Goal: Maintenance of adequate nutrition will improve Outcome: Progressing Goal: Progress toward achieving an optimal weight will improve Outcome: Progressing   Problem: Skin Integrity: Goal: Risk for impaired skin integrity will decrease Outcome: Progressing   Problem: Tissue Perfusion: Goal: Adequacy of tissue perfusion will improve Outcome: Progressing   Problem: Education: Goal: Knowledge of disease or condition will improve Outcome: Progressing Goal: Knowledge of the prescribed therapeutic regimen will improve Outcome: Progressing Goal: Individualized Educational Video(s) Outcome: Progressing   Problem: Activity: Goal: Ability to tolerate increased activity will improve Outcome: Progressing Goal: Will verbalize the importance of balancing activity with adequate rest periods Outcome: Progressing   Problem: Respiratory: Goal: Ability to maintain a clear airway will improve Outcome: Progressing Goal: Levels of oxygenation will improve Outcome: Progressing Goal: Ability to maintain adequate ventilation will improve Outcome: Progressing    Problem: Activity: Goal: Ability to tolerate increased activity will improve Outcome: Progressing   Problem: Clinical Measurements: Goal: Ability to maintain a body temperature in the normal range will improve Outcome: Progressing   Problem: Respiratory: Goal: Ability to maintain adequate ventilation will improve Outcome: Progressing Goal: Ability to maintain a clear airway will improve Outcome: Progressing   Problem: Education: Goal: Knowledge of General Education information will improve Description: Including pain rating scale, medication(s)/side effects and non-pharmacologic comfort measures Outcome: Progressing   Problem: Health Behavior/Discharge Planning: Goal: Ability to manage health-related needs will improve Outcome: Progressing   Problem: Clinical Measurements: Goal: Ability to maintain clinical measurements within normal limits will improve Outcome: Progressing Goal: Will remain free from infection Outcome: Progressing Goal: Diagnostic test results will improve Outcome: Progressing Goal: Respiratory complications will improve Outcome: Progressing Goal: Cardiovascular complication will be avoided Outcome: Progressing   Problem: Activity: Goal: Risk for activity intolerance will decrease Outcome: Progressing   Problem: Nutrition: Goal: Adequate nutrition will be maintained Outcome: Progressing   Problem: Coping: Goal: Level of anxiety will decrease Outcome: Progressing   Problem: Elimination: Goal: Will not experience complications related to bowel motility Outcome: Progressing Goal: Will not experience complications related to urinary retention Outcome: Progressing   Problem: Pain Managment: Goal: General experience of comfort will improve and/or be controlled Outcome: Progressing   Problem: Safety: Goal: Ability to remain free from injury will improve Outcome: Progressing   Problem: Skin Integrity: Goal: Risk for impaired skin integrity  will decrease Outcome: Progressing

## 2023-12-18 NOTE — Progress Notes (Signed)
 PROGRESS NOTE    Johnathan [name]  FMW:[id number] DOB: 1932/03/14 DOA: 12/17/2023 PCP: Watt Harlene BROCKS, MD   Brief Narrative:  89 y.o. male with medical history significant for COPD, chronic respiratory failure with hypoxia on 2 L O2 [place], T2DM, HTN, HLD, BPH presented with worsening shortness of breath.  On presentation he was saturating 93% on 2 L nasal cannula.  COVID/influenza/RSV PCR negative.  CTA chest negative for PE but showed findings concerning for right lower lobe and upper lobe pneumonia with advanced emphysema.  He was started on IV antibiotics and Solu-Medrol .  Assessment & Plan:   Acute on chronic hypoxic respiratory failure COPD exacerbation Probable community-acquired bacterial pneumonia: Bacterial unspecified - On 2 L nasal cannula oxygen  at home: Currently on 5 L open wean off as able. - Imaging as above. - Continue Rocephin  and Zithromax .  Strep pneumo urinary antigen negative.  Continue Solu-Medrol  and current nebs  Diabetes mellitus type 2 - Continue CBGs with SSI  Hypertension Hyperlipidemia - Continue amlodipine  and statin  BPH -Continue terazosin     DVT prophylaxis: Lovenox  Code Status: DNR Family Communication: None at bedside Disposition Plan: Status is: Inpatient Remains inpatient appropriate because: Of severity of illness    Consultants: None  Procedures: None  Antimicrobials: Rocephin  and Zithromax  from 12/17/2023 onwards   Subjective: Patient seen and examined at bedside.  Still short of breath with mild exertion.  No fever or vomiting reported.  Objective: Vitals:   12/17/23 2255 12/18/23 0307 12/18/23 0600 12/18/23 0603  BP:  (!) 145/78    Pulse:  82    Resp:  15    Temp:  98.2 F (36.8 C)    TempSrc:      SpO2: 93% 98% 97% 97%  Weight:      Height:        Intake/Output Summary (Last 24 hours) at 12/18/2023 0748 Last data filed at 12/18/2023 0500 Gross per 24 hour  Intake 700.1 ml  Output 200 ml  Net 500.1 ml    Filed Weights   12/17/23 1746  Weight: 68 kg    Examination:  General exam: Appears calm and comfortable.  Elderly male lying in bed Respiratory system: Bilateral decreased breath sounds at bases with scattered crackles Cardiovascular system: S1 & S2 heard, Rate controlled Gastrointestinal system: Abdomen is nondistended, soft and nontender. Normal bowel sounds heard. Extremities: No cyanosis, clubbing, edema  Central nervous system: Alert and oriented. No focal neurological deficits. Moving extremities Skin: No rashes, lesions or ulcers Psychiatry: Flat affect.  Not agitated   Data Reviewed: I have personally reviewed following labs and imaging studies  CBC: Recent Labs  Lab 12/17/23 1908 12/18/23 0458  WBC 7.6 5.4  NEUTROABS 5.7  --   HGB 12.1* 11.5*  HCT 37.0* 35.6*  MCV 96.9 97.5  PLT 163 162   Basic Metabolic Panel: Recent Labs  Lab 12/17/23 1908 12/18/23 0458  NA 135 135  K 4.6 4.7  CL 98 99  CO2 27 25  GLUCOSE 131* 231*  BUN 20 23  CREATININE 1.19 1.19  CALCIUM  9.5 9.2  MG 2.2  --    GFR: Estimated Creatinine Clearance: 40.5 mL/min (by C-G formula based on SCr of 1.19 mg/dL). Liver Function Tests: Recent Labs  Lab 12/17/23 1908  AST 19  ALT 9  ALKPHOS 99  BILITOT 0.7  PROT 8.5*  ALBUMIN  4.1   No results for input(s): LIPASE, AMYLASE in the last 168 hours. No results for input(s): AMMONIA in the last  168 hours. Coagulation Profile: No results for input(s): INR, PROTIME in the last 168 hours. Cardiac Enzymes: No results for input(s): CKTOTAL, CKMB, CKMBINDEX, TROPONINI in the last 168 hours. BNP (last 3 results) Recent Labs    12/17/23 1908  PROBNP 427.0*   HbA1C: No results for input(s): HGBA1C in the last 72 hours. CBG: Recent Labs  Lab 12/17/23 2244  GLUCAP 179*   Lipid Profile: No results for input(s): CHOL, HDL, LDLCALC, TRIG, CHOLHDL, LDLDIRECT in the last 72 hours. Thyroid  Function  Tests: No results for input(s): TSH, T4TOTAL, FREET4, T3FREE, THYROIDAB in the last 72 hours. Anemia Panel: No results for input(s): VITAMINB12, FOLATE, FERRITIN, TIBC, IRON, RETICCTPCT in the last 72 hours. Sepsis Labs: No results for input(s): PROCALCITON, LATICACIDVEN in the last 168 hours.  Recent Results (from the past 240 hours)  Resp panel by RT-PCR (RSV, Flu A&B, Covid) Anterior Nasal Swab     Status: None   Collection Time: 12/17/23  7:07 PM   Specimen: Anterior Nasal Swab  Result Value Ref Range Status   SARS Coronavirus 2 by RT PCR NEGATIVE NEGATIVE Final    Comment: (NOTE) SARS-CoV-2 target nucleic acids are NOT DETECTED.  The SARS-CoV-2 RNA is generally detectable in upper respiratory specimens during the acute phase of infection. The lowest concentration of SARS-CoV-2 viral copies this assay can detect is 138 copies/mL. A negative result does not preclude SARS-Cov-2 infection and should not be used as the sole basis for treatment or other patient management decisions. A negative result may occur with  improper specimen collection/handling, submission of specimen other than nasopharyngeal swab, presence of viral mutation(s) within the areas targeted by this assay, and inadequate number of viral copies(<138 copies/mL). A negative result must be combined with clinical observations, patient history, and epidemiological information. The expected result is Negative.  Fact Sheet for Patients:  bloggercourse.com  Fact Sheet for Healthcare Providers:  seriousbroker.it  This test is no t yet approved or cleared by the [place]  FDA and  has been authorized for detection and/or diagnosis of SARS-CoV-2 by FDA under an Emergency Use Authorization (EUA). This EUA will remain  in effect (meaning this test can be used) for the duration of the COVID-19 declaration under Section 564(b)(1) of the Act,  21 U.S.C.section 360bbb-3(b)(1), unless the authorization is terminated  or revoked sooner.       Influenza A by PCR NEGATIVE NEGATIVE Final   Influenza B by PCR NEGATIVE NEGATIVE Final    Comment: (NOTE) The Xpert Xpress SARS-CoV-2/FLU/RSV plus assay is intended as an aid in the diagnosis of influenza from Nasopharyngeal swab specimens and should not be used as a sole basis for treatment. Nasal washings and aspirates are unacceptable for Xpert Xpress SARS-CoV-2/FLU/RSV testing.  Fact Sheet for Patients: bloggercourse.com  Fact Sheet for Healthcare Providers: seriousbroker.it  This test is not yet approved or cleared by the [place]  FDA and has been authorized for detection and/or diagnosis of SARS-CoV-2 by FDA under an Emergency Use Authorization (EUA). This EUA will remain in effect (meaning this test can be used) for the duration of the COVID-19 declaration under Section 564(b)(1) of the Act, 21 U.S.C. section 360bbb-3(b)(1), unless the authorization is terminated or revoked.     Resp Syncytial Virus by PCR NEGATIVE NEGATIVE Final    Comment: (NOTE) Fact Sheet for Patients: bloggercourse.com  Fact Sheet for Healthcare Providers: seriousbroker.it  This test is not yet approved or cleared by the [place]  FDA and has been authorized for detection and/or  diagnosis of SARS-CoV-2 by FDA under an Emergency Use Authorization (EUA). This EUA will remain in effect (meaning this test can be used) for the duration of the COVID-19 declaration under Section 564(b)(1) of the Act, 21 U.S.C. section 360bbb-3(b)(1), unless the authorization is terminated or revoked.  Performed at [facility], [address], [place], [place] [address]          Radiology Studies: CT Angio Chest PE W and/or Wo Contrast Result Date: 12/17/2023 EXAM: CTA CHEST  12/17/2023 08:38:44 PM TECHNIQUE: CTA of the chest was performed without and with the administration of 75 mL of iohexol  (OMNIPAQUE ) 350 MG/ML injection. Multiplanar reformatted images are provided for review. MIP images are provided for review. Automated exposure control, iterative reconstruction, and/or weight based adjustment of the mA/kV was utilized to reduce the radiation dose to as low as reasonably achievable. COMPARISON: 10/29/2021 CLINICAL HISTORY: Pulmonary embolism (PE) suspected, high prob. FINDINGS: PULMONARY ARTERIES: Pulmonary arteries are adequately opacified for evaluation. No acute pulmonary embolus. Main pulmonary artery is normal in caliber. MEDIASTINUM: Diffuse coronary artery and moderate aortic calcifications. The heart and pericardium demonstrate no acute abnormality. There is no acute abnormality of the thoracic aorta. LYMPH NODES: No mediastinal, hilar or axillary lymphadenopathy. LUNGS AND PLEURA: Advanced emphysema. Scarring in the inferior right upper lobe. Consolidation in the posterior right lower lobe, and possibly superimposed on scarring in the posterior inferior right upper lobe concerning for pneumonia. No evidence of pleural effusion or pneumothorax. UPPER ABDOMEN: Small layering gallstones within the gallbladder. SOFT TISSUES AND BONES: No acute bone or soft tissue abnormality. IMPRESSION: 1. No pulmonary embolism. 2. Findings concerning for pneumonia in the right lower lobe and possibly superimposed on scarring in the posterior inferior right upper lobe. 3. Advanced emphysema. 4. Diffuse coronary artery disease. Aortic atherosclerosis. 5. Cholelithiasis. Electronically signed by: Franky Crease MD 12/17/2023 08:43 PM EST RP Workstation: [id number]   DG Chest Port 1 View Result Date: 12/17/2023 EXAM: 1 VIEW(S) XRAY OF THE CHEST 12/17/2023 06:28:00 PM COMPARISON: 11/20/2022 CLINICAL HISTORY: Shortness of breath. FINDINGS: LUNGS AND PLEURA: Linear densities in the lung bases  could reflect scarring or atelectasis. Patchy ill-defined right mid lung opacity. No pleural effusion. No pneumothorax. HEART AND MEDIASTINUM: Tortuous aorta. Aortic atherosclerosis. BONES AND SOFT TISSUES: No acute osseous abnormality. IMPRESSION: 1. Patchy ill-defined right mid lung opacity, which may reflect infection or inflammation. 2. Linear basilar opacities likely representing atelectasis or scarring. Electronically signed by: Franky Crease MD 12/17/2023 07:12 PM EST RP Workstation: [id number]        Scheduled Meds:  amLODipine   5 mg Oral Daily   arformoterol   15 mcg Nebulization BID   budesonide  (PULMICORT ) nebulizer solution  0.25 mg Nebulization BID   enoxaparin  (LOVENOX ) injection  40 mg Subcutaneous Q24H   guaiFENesin   600 mg Oral BID   insulin  aspart  0-5 Units Subcutaneous QHS   insulin  aspart  0-9 Units Subcutaneous TID WC   methylPREDNISolone  (SOLU-MEDROL ) injection  40 mg Intravenous Q12H   pantoprazole   40 mg Oral BID AC   pravastatin   20 mg Oral Daily   primidone   50 mg Oral BID   sodium chloride  flush  3 mL Intravenous Q12H   terazosin   1 mg Oral BID   Continuous Infusions:  azithromycin      cefTRIAXone  (ROCEPHIN )  IV            Sophie Mao, MD [facility] 12/18/2023, 7:48 AM

## 2023-12-19 ENCOUNTER — Encounter (HOSPITAL_COMMUNITY): Payer: Self-pay | Admitting: Internal Medicine

## 2023-12-19 DIAGNOSIS — J9621 Acute and chronic respiratory failure with hypoxia: Secondary | ICD-10-CM | POA: Diagnosis not present

## 2023-12-19 LAB — GLUCOSE, CAPILLARY
Glucose-Capillary: 181 mg/dL — ABNORMAL HIGH (ref 70–99)
Glucose-Capillary: 126 mg/dL — ABNORMAL HIGH (ref 70–99)
Glucose-Capillary: 218 mg/dL — ABNORMAL HIGH (ref 70–99)
Glucose-Capillary: 120 mg/dL — ABNORMAL HIGH (ref 70–99)

## 2023-12-19 NOTE — Plan of Care (Signed)
  Problem: Education: Goal: Ability to describe self-care measures that may prevent or decrease complications (Diabetes Survival Skills Education) will improve Outcome: Progressing Goal: Individualized Educational Video(s) Outcome: Progressing   Problem: Coping: Goal: Ability to adjust to condition or change in health will improve Outcome: Progressing   Problem: Fluid Volume: Goal: Ability to maintain a balanced intake and output will improve Outcome: Progressing   Problem: Health Behavior/Discharge Planning: Goal: Ability to identify and utilize available resources and services will improve Outcome: Progressing Goal: Ability to manage health-related needs will improve Outcome: Progressing   Problem: Metabolic: Goal: Ability to maintain appropriate glucose levels will improve Outcome: Progressing   Problem: Nutritional: Goal: Maintenance of adequate nutrition will improve Outcome: Progressing Goal: Progress toward achieving an optimal weight will improve Outcome: Progressing   Problem: Skin Integrity: Goal: Risk for impaired skin integrity will decrease Outcome: Progressing   Problem: Tissue Perfusion: Goal: Adequacy of tissue perfusion will improve Outcome: Progressing   Problem: Education: Goal: Knowledge of disease or condition will improve Outcome: Progressing Goal: Knowledge of the prescribed therapeutic regimen will improve Outcome: Progressing Goal: Individualized Educational Video(s) Outcome: Progressing   Problem: Activity: Goal: Ability to tolerate increased activity will improve Outcome: Progressing Goal: Will verbalize the importance of balancing activity with adequate rest periods Outcome: Progressing   Problem: Respiratory: Goal: Ability to maintain a clear airway will improve Outcome: Progressing Goal: Levels of oxygenation will improve Outcome: Progressing Goal: Ability to maintain adequate ventilation will improve Outcome: Progressing    Problem: Activity: Goal: Ability to tolerate increased activity will improve Outcome: Progressing   Problem: Clinical Measurements: Goal: Ability to maintain a body temperature in the normal range will improve Outcome: Progressing   Problem: Respiratory: Goal: Ability to maintain adequate ventilation will improve Outcome: Progressing Goal: Ability to maintain a clear airway will improve Outcome: Progressing   Problem: Education: Goal: Knowledge of General Education information will improve Description: Including pain rating scale, medication(s)/side effects and non-pharmacologic comfort measures Outcome: Progressing   Problem: Health Behavior/Discharge Planning: Goal: Ability to manage health-related needs will improve Outcome: Progressing   Problem: Clinical Measurements: Goal: Ability to maintain clinical measurements within normal limits will improve Outcome: Progressing Goal: Will remain free from infection Outcome: Progressing Goal: Diagnostic test results will improve Outcome: Progressing Goal: Respiratory complications will improve Outcome: Progressing Goal: Cardiovascular complication will be avoided Outcome: Progressing   Problem: Activity: Goal: Risk for activity intolerance will decrease Outcome: Progressing   Problem: Nutrition: Goal: Adequate nutrition will be maintained Outcome: Progressing   Problem: Coping: Goal: Level of anxiety will decrease Outcome: Progressing   Problem: Elimination: Goal: Will not experience complications related to bowel motility Outcome: Progressing Goal: Will not experience complications related to urinary retention Outcome: Progressing   Problem: Pain Managment: Goal: General experience of comfort will improve and/or be controlled Outcome: Progressing   Problem: Safety: Goal: Ability to remain free from injury will improve Outcome: Progressing   Problem: Skin Integrity: Goal: Risk for impaired skin integrity  will decrease Outcome: Progressing

## 2023-12-19 NOTE — Progress Notes (Signed)
 PROGRESS NOTE    Johnathan [name]  FMW:[id number] DOB: May 25, 1932 DOA: 12/17/2023 PCP: Watt Harlene BROCKS, MD   Brief Narrative:  89 y.o. male with medical history significant for COPD, chronic respiratory failure with hypoxia on 2 L O2 [place], T2DM, HTN, HLD, BPH presented with worsening shortness of breath.  On presentation he was saturating 93% on 2 L nasal cannula.  COVID/influenza/RSV PCR negative.  CTA chest negative for PE but showed findings concerning for right lower lobe and upper lobe pneumonia with advanced emphysema.  He was started on IV antibiotics and Solu-Medrol .  Assessment & Plan:   Acute on chronic hypoxic respiratory failure COPD exacerbation Probable community-acquired bacterial pneumonia: Bacterial unspecified - On 2 L nasal cannula oxygen  at home: Currently on 3 L.  Wean off as able. - Imaging as above. - Continue Rocephin  and Zithromax .  Strep pneumo urinary antigen negative.  Continue Solu-Medrol  and current nebs  Diabetes mellitus type 2 - Continue CBGs with SSI  Hypertension Hyperlipidemia - Continue amlodipine  and statin  BPH -Continue terazosin   Physical deconditioning - PT eval    DVT prophylaxis: Lovenox  Code Status: DNR Family Communication: None at bedside Disposition Plan: Status is: Inpatient Remains inpatient appropriate because: Of severity of illness    Consultants: None  Procedures: None  Antimicrobials: Rocephin  and Zithromax  from 12/17/2023 onwards   Subjective: Patient seen and examined at bedside.  Feels slightly better but still has intermittent cough and a short of breath with mild exertion.  No fever or vomiting reported. Objective: Vitals:   12/18/23 2109 12/19/23 0642 12/19/23 0644 12/19/23 0736  BP:  (!) 149/82 (!) 157/80   Pulse:  67 66   Resp:   16   Temp:  98 F (36.7 C) 98 F (36.7 C)   TempSrc:      SpO2: 95% 98% 98% 97%  Weight:      Height:        Intake/Output Summary (Last 24 hours) at 12/19/2023  0747 Last data filed at 12/19/2023 0644 Gross per 24 hour  Intake 740 ml  Output 1270 ml  Net -530 ml   Filed Weights   12/17/23 1746  Weight: 68 kg    Examination:  General: On 3 L oxygen  by nasal cannula.  No distress ENT/neck: No thyromegaly.  JVD is not elevated  respiratory: Decreased breath sounds at bases bilaterally with some crackles; no wheezing  CVS: S1-S2 heard, rate controlled currently Abdominal: Soft, nontender, slightly distended; no organomegaly, bowel sounds are heard Extremities: Trace lower extremity edema; no cyanosis  CNS: Awake and alert.  No focal neurologic deficit.  Moves extremities Lymph: No obvious lymphadenopathy Skin: No obvious ecchymosis/lesions  psych: Affect is mostly flat.  Currently not agitated  musculoskeletal: No obvious joint swelling/deformity    Data Reviewed: I have personally reviewed following labs and imaging studies  CBC: Recent Labs  Lab 12/17/23 1908 12/18/23 0458  WBC 7.6 5.4  NEUTROABS 5.7  --   HGB 12.1* 11.5*  HCT 37.0* 35.6*  MCV 96.9 97.5  PLT 163 162   Basic Metabolic Panel: Recent Labs  Lab 12/17/23 1908 12/18/23 0458  NA 135 135  K 4.6 4.7  CL 98 99  CO2 27 25  GLUCOSE 131* 231*  BUN 20 23  CREATININE 1.19 1.19  CALCIUM  9.5 9.2  MG 2.2  --    GFR: Estimated Creatinine Clearance: 40.5 mL/min (by C-G formula based on SCr of 1.19 mg/dL). Liver Function Tests: Recent Labs  Lab 12/17/23  1908  AST 19  ALT 9  ALKPHOS 99  BILITOT 0.7  PROT 8.5*  ALBUMIN  4.1   No results for input(s): LIPASE, AMYLASE in the last 168 hours. No results for input(s): AMMONIA in the last 168 hours. Coagulation Profile: No results for input(s): INR, PROTIME in the last 168 hours. Cardiac Enzymes: No results for input(s): CKTOTAL, CKMB, CKMBINDEX, TROPONINI in the last 168 hours. BNP (last 3 results) Recent Labs    12/17/23 1908  PROBNP 427.0*   HbA1C: No results for input(s): HGBA1C in  the last 72 hours. CBG: Recent Labs  Lab 12/17/23 2244 12/18/23 1644 12/18/23 2123 12/19/23 0736  GLUCAP 179* 141* 152* 181*   Lipid Profile: No results for input(s): CHOL, HDL, LDLCALC, TRIG, CHOLHDL, LDLDIRECT in the last 72 hours. Thyroid  Function Tests: No results for input(s): TSH, T4TOTAL, FREET4, T3FREE, THYROIDAB in the last 72 hours. Anemia Panel: No results for input(s): VITAMINB12, FOLATE, FERRITIN, TIBC, IRON, RETICCTPCT in the last 72 hours. Sepsis Labs: No results for input(s): PROCALCITON, LATICACIDVEN in the last 168 hours.  Recent Results (from the past 240 hours)  Resp panel by RT-PCR (RSV, Flu A&B, Covid) Anterior Nasal Swab     Status: None   Collection Time: 12/17/23  7:07 PM   Specimen: Anterior Nasal Swab  Result Value Ref Range Status   SARS Coronavirus 2 by RT PCR NEGATIVE NEGATIVE Final    Comment: (NOTE) SARS-CoV-2 target nucleic acids are NOT DETECTED.  The SARS-CoV-2 RNA is generally detectable in upper respiratory specimens during the acute phase of infection. The lowest concentration of SARS-CoV-2 viral copies this assay can detect is 138 copies/mL. A negative result does not preclude SARS-Cov-2 infection and should not be used as the sole basis for treatment or other patient management decisions. A negative result may occur with  improper specimen collection/handling, submission of specimen other than nasopharyngeal swab, presence of viral mutation(s) within the areas targeted by this assay, and inadequate number of viral copies(<138 copies/mL). A negative result must be combined with clinical observations, patient history, and epidemiological information. The expected result is Negative.  Fact Sheet for Patients:  bloggercourse.com  Fact Sheet for Healthcare Providers:  seriousbroker.it  This test is no t yet approved or cleared by the [place]   FDA and  has been authorized for detection and/or diagnosis of SARS-CoV-2 by FDA under an Emergency Use Authorization (EUA). This EUA will remain  in effect (meaning this test can be used) for the duration of the COVID-19 declaration under Section 564(b)(1) of the Act, 21 U.S.C.section 360bbb-3(b)(1), unless the authorization is terminated  or revoked sooner.       Influenza A by PCR NEGATIVE NEGATIVE Final   Influenza B by PCR NEGATIVE NEGATIVE Final    Comment: (NOTE) The Xpert Xpress SARS-CoV-2/FLU/RSV plus assay is intended as an aid in the diagnosis of influenza from Nasopharyngeal swab specimens and should not be used as a sole basis for treatment. Nasal washings and aspirates are unacceptable for Xpert Xpress SARS-CoV-2/FLU/RSV testing.  Fact Sheet for Patients: bloggercourse.com  Fact Sheet for Healthcare Providers: seriousbroker.it  This test is not yet approved or cleared by the [place]  FDA and has been authorized for detection and/or diagnosis of SARS-CoV-2 by FDA under an Emergency Use Authorization (EUA). This EUA will remain in effect (meaning this test can be used) for the duration of the COVID-19 declaration under Section 564(b)(1) of the Act, 21 U.S.C. section 360bbb-3(b)(1), unless the authorization is terminated or revoked.  Resp Syncytial Virus by PCR NEGATIVE NEGATIVE Final    Comment: (NOTE) Fact Sheet for Patients: bloggercourse.com  Fact Sheet for Healthcare Providers: seriousbroker.it  This test is not yet approved or cleared by the [place]  FDA and has been authorized for detection and/or diagnosis of SARS-CoV-2 by FDA under an Emergency Use Authorization (EUA). This EUA will remain in effect (meaning this test can be used) for the duration of the COVID-19 declaration under Section 564(b)(1) of the Act, 21 U.S.C. section  360bbb-3(b)(1), unless the authorization is terminated or revoked.  Performed at [facility], [address], [place], [place] [address]          Radiology Studies: CT Angio Chest PE W and/or Wo Contrast Result Date: 12/17/2023 EXAM: CTA CHEST 12/17/2023 08:38:44 PM TECHNIQUE: CTA of the chest was performed without and with the administration of 75 mL of iohexol  (OMNIPAQUE ) 350 MG/ML injection. Multiplanar reformatted images are provided for review. MIP images are provided for review. Automated exposure control, iterative reconstruction, and/or weight based adjustment of the mA/kV was utilized to reduce the radiation dose to as low as reasonably achievable. COMPARISON: 10/29/2021 CLINICAL HISTORY: Pulmonary embolism (PE) suspected, high prob. FINDINGS: PULMONARY ARTERIES: Pulmonary arteries are adequately opacified for evaluation. No acute pulmonary embolus. Main pulmonary artery is normal in caliber. MEDIASTINUM: Diffuse coronary artery and moderate aortic calcifications. The heart and pericardium demonstrate no acute abnormality. There is no acute abnormality of the thoracic aorta. LYMPH NODES: No mediastinal, hilar or axillary lymphadenopathy. LUNGS AND PLEURA: Advanced emphysema. Scarring in the inferior right upper lobe. Consolidation in the posterior right lower lobe, and possibly superimposed on scarring in the posterior inferior right upper lobe concerning for pneumonia. No evidence of pleural effusion or pneumothorax. UPPER ABDOMEN: Small layering gallstones within the gallbladder. SOFT TISSUES AND BONES: No acute bone or soft tissue abnormality. IMPRESSION: 1. No pulmonary embolism. 2. Findings concerning for pneumonia in the right lower lobe and possibly superimposed on scarring in the posterior inferior right upper lobe. 3. Advanced emphysema. 4. Diffuse coronary artery disease. Aortic atherosclerosis. 5. Cholelithiasis. Electronically signed by: Franky Crease MD  12/17/2023 08:43 PM EST RP Workstation: [id number]   DG Chest Port 1 View Result Date: 12/17/2023 EXAM: 1 VIEW(S) XRAY OF THE CHEST 12/17/2023 06:28:00 PM COMPARISON: 11/20/2022 CLINICAL HISTORY: Shortness of breath. FINDINGS: LUNGS AND PLEURA: Linear densities in the lung bases could reflect scarring or atelectasis. Patchy ill-defined right mid lung opacity. No pleural effusion. No pneumothorax. HEART AND MEDIASTINUM: Tortuous aorta. Aortic atherosclerosis. BONES AND SOFT TISSUES: No acute osseous abnormality. IMPRESSION: 1. Patchy ill-defined right mid lung opacity, which may reflect infection or inflammation. 2. Linear basilar opacities likely representing atelectasis or scarring. Electronically signed by: Kevin Dover MD 12/17/2023 07:12 PM EST RP Workstation: [id number]        Scheduled Meds:  amLODipine   5 mg Oral Daily   arformoterol   15 mcg Nebulization BID   budesonide  (PULMICORT ) nebulizer solution  0.25 mg Nebulization BID   enoxaparin  (LOVENOX ) injection  40 mg Subcutaneous Q24H   guaiFENesin   600 mg Oral BID   insulin  aspart  0-5 Units Subcutaneous QHS   insulin  aspart  0-9 Units Subcutaneous TID WC   methylPREDNISolone  (SOLU-MEDROL ) injection  40 mg Intravenous Q12H   pantoprazole   40 mg Oral BID AC   pravastatin   20 mg Oral Daily   primidone   50 mg Oral BID   sodium chloride  flush  3 mL Intravenous Q12H   terazosin   1 mg Oral  BID   Continuous Infusions:  azithromycin  500 mg (12/18/23 1729)   cefTRIAXone  (ROCEPHIN )  IV 2 g (12/18/23 1726)          Sophie Mao, MD [facility] 12/19/2023, 7:47 AM

## 2023-12-19 NOTE — TOC Initial Note (Signed)
 Transition of Care [facility]) - Initial/Assessment Note    Patient Details  Name: Johnathan [name] MRN: [id number] Date of Birth: 24-Apr-1932  Transition of Care [facility]) CM/SW Contact:    Bascom Service, RN Phone Number: 12/19/2023, 2:38 PM  Clinical Narrative: Spoke to Karen(Niece) works w/[facility]-she prefers [facility] if recc for HHC. Darice prefers [facility] if recc for home hospice services. PT eval-await recc.Has home 02-[facility]-has travel tank Has own transport home,                   Expected Discharge Plan: Home w Home Health Services Barriers to Discharge: Continued Medical Work up   Patient Goals and CMS Choice Patient states their goals for this hospitalization and ongoing recovery are:: Home CMS Medicare.gov Compare Post Acute Care list provided to:: Patient Represenative (must comment) (Karen(Niece)) Choice offered to / list presented to : Adult Children [place] ownership interest in [facility].provided to:: Adult Children    Expected Discharge Plan and Services   Discharge Planning Services: CM Consult Post Acute Care Choice: Home Health Living arrangements for the past 2 months: Single Family Home                                      Prior Living Arrangements/Services Living arrangements for the past 2 months: Single Family Home Lives with:: Self   Do you feel safe going back to the place where you live?: Yes          Current home services: DME (rw)    Activities of Daily Living   ADL Screening (condition at time of admission) Independently performs ADLs?: Yes (appropriate for developmental age) Is the patient deaf or have difficulty hearing?: No Does the patient have difficulty seeing, even when wearing glasses/contacts?: No Does the patient have difficulty concentrating, remembering, or making decisions?: No  Permission Sought/Granted Permission sought to share information with : Case Manager                 Emotional Assessment              Admission diagnosis:  Acute on chronic respiratory failure with hypoxia (HCC) [J96.21] Pneumonia of right lower lobe due to infectious organism [J18.9] Patient Active Problem List   Diagnosis Date Noted   Severe sepsis (HCC) 06/18/2022   Hyperlipidemia associated with type 2 diabetes mellitus (HCC) 06/18/2022   Preop pulmonary/respiratory exam 04/09/2022   GERD (gastroesophageal reflux disease) 09/11/2021   Acute on chronic respiratory failure with hypoxia (HCC) 09/10/2021   Interstitial pulmonary disease (HCC) 03/19/2021   Hypertension associated with diabetes (HCC) 03/19/2021   Oral candidiasis 03/05/2021   Chronic respiratory failure with hypoxia (HCC) 03/05/2021   Trigger finger, right ring finger 02/13/2021   COPD with chronic bronchitis and emphysema (HCC) 04/26/2020   COVID-19 02/25/2020   Shingles 11/14/2019   Raynaud's syndrome 11/14/2019   Skin cancer 11/14/2019   Left inguinal hernia s/p lap repair w mesh 05/06/2019 05/06/2019   Recurrent right inguinal hernia s/p lap repair w mesh 05/06/2019 03/01/2019   Type 2 diabetes mellitus (HCC) 03/01/2019   Community acquired pneumonia of right lower lobe of lung 04/21/2014   COPD with acute exacerbation (HCC) 04/21/2014   PCP:  Watt Harlene BROCKS, MD Pharmacy:   [facility] [address], [place] - [address] [name] [address] Phone: [phone] Fax:  [phone]  [facility] - [place], [place] - [address] [place] [address] Phone: [phone] Fax: [phone]     Social Drivers of Health (SDOH) Social History: SDOH Screenings   Food Insecurity: No Food Insecurity (12/17/2023)  Housing: Low Risk  (12/17/2023)  Transportation Needs: No Transportation Needs (12/17/2023)  Utilities: Not At Risk (12/17/2023)  Alcohol Screen: Low Risk  (01/28/2023)  Depression  (PHQ2-9): Low Risk  (10/29/2023)  Financial Resource Strain: Low Risk  (01/28/2023)  Physical Activity: Inactive (01/28/2023)  Social Connections: Moderately Integrated (12/17/2023)  Stress: No Stress Concern Present (01/28/2023)  Tobacco Use: Medium Risk (12/19/2023)  Health Literacy: Adequate Health Literacy (01/28/2023)   SDOH Interventions:     Readmission Risk Interventions    06/24/2022   10:55 AM 06/20/2022   11:41 AM 09/14/2021    9:56 AM  Readmission Risk Prevention Plan  Post Dischage Appt  Complete Complete  Medication Screening  Complete Complete  Transportation Screening Complete Complete Complete  PCP or Specialist Appt within 5-7 Days Complete    Home Care Screening Complete    Medication Review (RN CM) Complete

## 2023-12-19 NOTE — Plan of Care (Signed)
  Problem: Education: Goal: Ability to describe self-care measures that may prevent or decrease complications (Diabetes Survival Skills Education) will improve Outcome: Progressing   Problem: Skin Integrity: Goal: Risk for impaired skin integrity will decrease Outcome: Progressing   Problem: Respiratory: Goal: Ability to maintain a clear airway will improve Outcome: Progressing   Problem: Safety: Goal: Ability to remain free from injury will improve Outcome: Progressing

## 2023-12-19 NOTE — Evaluation (Signed)
 Physical Therapy Evaluation Patient Details Name: Johnathan [name] MRN: [id number] DOB: 10/30/1932 Today's Date: 12/19/2023  History of Present Illness  Pt is 89 yo male admitted on 12/17/23 with acute on chronic resp failure due to COPD and CAP.  Pt with hx including but not limited to COPD on 2 L home O2, GERD, HTN, DM II, HLD, essential tremor  Clinical Impression  Pt admitted with above diagnosis. At baseline, pt independent and ambulatory with cane.  Today , pt ambulating 200' with RW.  He did fatigue easily and required rest breaks and cues for pursed lip breathing.  Pt's sats dropping to 83% on 2 L requiring 3 L to maintain 90%.   Pt currently with functional limitations due to the deficits listed below (see PT Problem List). Pt will benefit from acute skilled PT to increase their independence and safety with mobility to allow discharge.  Pt expected to progress well and likely no PT needs at d/c.          If plan is discharge home, recommend the following: Assistance with cooking/housework;Help with stairs or ramp for entrance   Can travel by private vehicle        Equipment Recommendations None recommended by PT  Recommendations for Other Services       Functional Status Assessment Patient has had a recent decline in their functional status and demonstrates the ability to make significant improvements in function in a reasonable and predictable amount of time.     Precautions / Restrictions Precautions Precautions: Fall      Mobility  Bed Mobility Overal bed mobility: Needs Assistance Bed Mobility: Supine to Sit, Sit to Supine     Supine to sit: Supervision Sit to supine: Supervision        Transfers Overall transfer level: Needs assistance Equipment used: Rolling walker (2 wheels) Transfers: Sit to/from Stand Sit to Stand: Contact guard assist                Ambulation/Gait Ambulation/Gait assistance: Contact guard assist Gait Distance (Feet): 200  Feet Assistive device: Rolling walker (2 wheels) Gait Pattern/deviations: Step-to pattern, Decreased stride length, Trunk flexed Gait velocity: decreased     General Gait Details: cues for RW proximity and PLB; 1 standing rest break  Stairs            Wheelchair Mobility     Tilt Bed    Modified Rankin (Stroke Patients Only)       Balance Overall balance assessment: Needs assistance Sitting-balance support: No upper extremity supported Sitting balance-Leahy Scale: Good     Standing balance support: No upper extremity supported Standing balance-Leahy Scale: Fair Standing balance comment: RW to ambulate but could stand without support                             Pertinent Vitals/Pain Pain Assessment Pain Assessment: No/denies pain    Home Living Family/patient expects to be discharged to:: Private residence Living Arrangements: Non-relatives/Friends (roommate) Available Help at Discharge: Friend(s);Available PRN/intermittently Type of Home: House Home Access: Stairs to enter   Entrance Stairs-Number of Steps: 1   Home Layout: One level Home Equipment: Agricultural Consultant (2 wheels);Cane - single point Additional Comments: on 2 L O2 at home    Prior Function Prior Level of Function : Independent/Modified Independent             Mobility Comments: walks with cane; ambulates short community distances; uses art gallery manager  at stores ADLs Comments: independent adls and iadls     Extremity/Trunk Assessment   Upper Extremity Assessment Upper Extremity Assessment: Overall WFL for tasks assessed    Lower Extremity Assessment Lower Extremity Assessment: Overall WFL for tasks assessed    Cervical / Trunk Assessment Cervical / Trunk Assessment: Kyphotic  Communication        Cognition Arousal: Alert Behavior During Therapy: WFL for tasks assessed/performed   PT - Cognitive impairments: No apparent impairments                                  Cueing       General Comments General comments (skin integrity, edema, etc.): On 3 L with o2 99%.  Tried 2 L to ambulate but sats down to 83%.  Increased to 3 L and cues for PLB.  Sats maintaining 90% on 3 L with activity.    Exercises     Assessment/Plan    PT Assessment Patient needs continued PT services  PT Problem List Decreased strength;Decreased mobility;Decreased range of motion;Decreased activity tolerance;Decreased balance;Decreased knowledge of use of DME;Cardiopulmonary status limiting activity       PT Treatment Interventions DME instruction;Therapeutic exercise;Gait training;Stair training;Functional mobility training;Therapeutic activities;Patient/family education    PT Goals (Current goals can be found in the Care Plan section)  Acute Rehab PT Goals Patient Stated Goal: return home PT Goal Formulation: With patient Time For Goal Achievement: 01/02/24 Potential to Achieve Goals: Good    Frequency Min 1X/week     Co-evaluation               AM-PAC PT 6 Clicks Mobility  Outcome Measure Help needed turning from your back to your side while in a flat bed without using bedrails?: None Help needed moving from lying on your back to sitting on the side of a flat bed without using bedrails?: None Help needed moving to and from a bed to a chair (including a wheelchair)?: A Little Help needed standing up from a chair using your arms (e.g., wheelchair or bedside chair)?: A Little Help needed to walk in hospital room?: A Little Help needed climbing 3-5 steps with a railing? : A Little 6 Click Score: 20    End of Session Equipment Utilized During Treatment: Gait belt Activity Tolerance: Patient tolerated treatment well Patient left: in chair;with call bell/phone within reach;with chair alarm set Nurse Communication: Mobility status PT Visit Diagnosis: Other abnormalities of gait and mobility (R26.89);Muscle weakness (generalized) (M62.81)     Time: 8499-8478 PT Time Calculation (min) (ACUTE ONLY): 21 min   Charges:   PT Evaluation $PT Eval Low Complexity: 1 Low   PT General Charges $$ ACUTE PT VISIT: 1 Visit         Johnathan, PT Acute Rehab [facility] [phone]   Johnathan [name] 12/19/2023, 3:58 PM

## 2023-12-20 DIAGNOSIS — J9621 Acute and chronic respiratory failure with hypoxia: Secondary | ICD-10-CM | POA: Diagnosis not present

## 2023-12-20 LAB — LEGIONELLA PNEUMOPHILA SEROGP 1 UR AG: L. pneumophila Serogp 1 Ur Ag: NEGATIVE

## 2023-12-20 LAB — GLUCOSE, CAPILLARY: Glucose-Capillary: 155 mg/dL — ABNORMAL HIGH (ref 70–99)

## 2023-12-20 MED ORDER — GUAIFENESIN ER 600 MG PO TB12: 600.0000 mg | ORAL_TABLET | Freq: Two times a day (BID) | ORAL | 0 refills | Status: AC | PRN

## 2023-12-20 MED ORDER — PREDNISONE 20 MG PO TABS: 40.0000 mg | ORAL_TABLET | Freq: Every day | ORAL | 0 refills | Status: AC

## 2023-12-20 MED ORDER — BENZONATATE 100 MG PO CAPS: 100.0000 mg | ORAL_CAPSULE | Freq: Two times a day (BID) | ORAL | 0 refills | Status: AC | PRN

## 2023-12-20 MED ORDER — CEFUROXIME AXETIL 500 MG PO TABS: 500.0000 mg | ORAL_TABLET | Freq: Two times a day (BID) | ORAL | 0 refills | Status: AC

## 2023-12-20 NOTE — Discharge Summary (Signed)
 Physician Discharge Summary  Johnathan [name] FMW:[id number] DOB: 09-25-1932 DOA: 12/17/2023  PCP: Watt Harlene BROCKS, MD  Admit date: 12/17/2023 Discharge date: 12/20/2023  Admitted From: Home Disposition: Home  Recommendations for Outpatient Follow-up:  Follow up with PCP in 1 week with repeat CBC/BMP Follow up in ED if symptoms worsen or new appear   Home Health: No Equipment/Devices: Continue supplemental oxygen  via nasal cannula  Discharge Condition: Stable CODE STATUS: DNR Diet recommendation: Heart healthy  Brief/Interim Summary: 89 y.o. male with medical history significant for COPD, chronic respiratory failure with hypoxia on 2 L O2 [place], T2DM, HTN, HLD, BPH presented with worsening shortness of breath.  On presentation he was saturating 93% on 2 L nasal cannula.  COVID/influenza/RSV PCR negative.  CTA chest negative for PE but showed findings concerning for right lower lobe and upper lobe pneumonia with advanced emphysema.  He was started on IV antibiotics and Solu-Medrol .  During the hospitalization, his condition has improved.  He feels much better today and feels okay to go home today.  He will be discharged home today on oral Ceftin and prednisone .  Outpatient follow-up with PCP.  Discharge Diagnoses:   Acute on chronic hypoxic respiratory failure COPD exacerbation Probable community-acquired bacterial pneumonia: Bacterial unspecified - On 2 L nasal cannula oxygen  at home: Currently on 3 L.  - Imaging as above. - Treated with IV Rocephin  and Zithromax .  Strep pneumo urinary antigen negative.  Currently on Solu-Medrol  and nebs -During the hospitalization, his condition has improved.  He feels much better today and feels okay to go home today.  He will be discharged home today on oral Ceftin for 4 days and prednisone  40 mg daily for 7 days.  Outpatient follow-up with PCP.   Diabetes mellitus type 2 - Continue metformin .  Carb modified diet.  Outpatient follow-up with PCP.    Hypertension Hyperlipidemia - Continue amlodipine  and statin   BPH -Continue terazosin    Physical deconditioning - PT recommended no PT follow-up   Discharge Instructions  Discharge Instructions     Diet - low sodium heart healthy   Complete by: As directed    Increase activity slowly   Complete by: As directed       Allergies as of 12/20/2023   No Known Allergies      Medication List     STOP taking these medications    cetirizine  5 MG tablet Commonly known as: ZYRTEC    Mucinex  Fast-Max Severe Con/Cg 5-100 MG/5ML Liqd Generic drug: Dextromethorphan -guaiFENesin    traMADol  50 MG tablet Commonly known as: ULTRAM        TAKE these medications    amLODipine  5 MG tablet Commonly known as: NORVASC  Take 1 tablet (5 mg total) by mouth daily.   aspirin  EC 81 MG tablet Take 81 mg by mouth every morning.   benzonatate  100 MG capsule Commonly known as: TESSALON  Take 1 capsule (100 mg total) by mouth 2 (two) times daily as needed for cough.   cefUROXime 500 MG tablet Commonly known as: CEFTIN Take 1 tablet (500 mg total) by mouth 2 (two) times daily for 4 days.   Flaxseed Oil 1200 MG Caps Take 1,200 mg by mouth at bedtime.   Ginkgo Biloba Extr Take 1 capsule by mouth 2 (two) times daily.   guaiFENesin  600 MG 12 hr tablet Commonly known as: Mucinex  Take 1 tablet (600 mg total) by mouth 2 (two) times daily as needed for cough. What changed:  how much to take when to take this  reasons to take this   HIMALAYAN GOJI PO Take by mouth See admin instructions. Goji berries - Eat a handful of berries by mouth once a day   Investigational - Study Medication Inject 450 mg into the skin See admin instructions. STUDY [id number] 450 mg or placebo SQ pre-filled syringe 450 mg   ketoconazole  2 % cream Commonly known as: NIZORAL  Apply 1 Application topically daily. Use as needed for fungal infection   ketoconazole  2 % shampoo Commonly known as:  NIZORAL  Apply 1 Application topically 2 (two) times a week. Use as needed for itchy scalp   loratadine  10 MG tablet Commonly known as: CLARITIN  Take 10 mg by mouth in the morning.   meloxicam  15 MG tablet Commonly known as: MOBIC  TAKE 1 TABLET BY MOUTH IN THE MORNING   metFORMIN  500 MG tablet Commonly known as: GLUCOPHAGE  Take 0.5 tablets (250 mg total) by mouth daily. What changed: when to take this   Myrbetriq  50 MG Tb24 tablet Generic drug: mirabegron  ER Take 1 tablet (50 mg total) by mouth daily.   Ohtuvayre  3 MG/2.5ML Susp Generic drug: Ensifentrine  Inhale 3 mg into the lungs 2 (two) times daily.   omeprazole  20 MG capsule Commonly known as: PRILOSEC Take 1 capsule (20 mg total) by mouth 2 (two) times daily. What changed: when to take this   One-A-Day Mens 50+ Tabs Take 1 tablet by mouth daily with breakfast.   OXYGEN  Inhale 2 L/min into the lungs continuous.   pravastatin  40 MG tablet Commonly known as: PRAVACHOL  Take 0.5 tablets (20 mg total) by mouth daily.   predniSONE  20 MG tablet Commonly known as: DELTASONE  Take 2 tablets (40 mg total) by mouth daily with breakfast for 7 days. Start taking on: December 21, 2023   primidone  50 MG tablet Commonly known as: MYSOLINE  Take 1 tablet (50 mg total) by mouth 2 (two) times daily.   SUPER B COMPLEX PO Take 1 tablet by mouth at bedtime.   Systane Complete PF 0.6 % Soln Generic drug: Propylene Glycol (PF) Place 1 drop into both eyes daily.   tadalafil  20 MG tablet Commonly known as: CIALIS  TAKE 1/2 TO 1 (ONE-HALF TO ONE) TABLET BY MOUTH AS NEEDED 2 HOURS PRIOR TO SEXUAL ACTIVITY What changed: See the new instructions.   terazosin  1 MG capsule Commonly known as: HYTRIN  Take 1 capsule by mouth twice daily   Trelegy Ellipta  100-62.5-25 MCG/ACT Aepb Generic drug: Fluticasone -Umeclidin-Vilant Inhale 1 puff into the lungs daily. Rinse mouth after use What changed: when to take this   triamcinolone  cream  0.1 % Commonly known as: KENALOG  Apply 1 Application topically 2 (two) times daily as needed (rash/itching).   TYLENOL  500 MG tablet Generic drug: acetaminophen  Take 500 mg by mouth every 6 (six) hours as needed for mild pain (pain score 1-3).   Ventolin  HFA 108 (90 Base) MCG/ACT inhaler Generic drug: albuterol  Inhale 2 puffs into the lungs every 6 (six) hours as needed for wheezing or shortness of breath.   VITAMIN E PO Take 1 capsule by mouth daily with breakfast.        Follow-up Information     Copland, Harlene BROCKS, MD. Schedule an appointment as soon as possible for a visit in 1 week(s).   Specialty: Family Medicine Contact information: [address] [place] [address] [phone]                No Known Allergies  Consultations: None   Procedures/Studies: CT  Angio Chest PE W and/or Wo Contrast Result Date: 12/17/2023 EXAM: CTA CHEST 12/17/2023 08:38:44 PM TECHNIQUE: CTA of the chest was performed without and with the administration of 75 mL of iohexol  (OMNIPAQUE ) 350 MG/ML injection. Multiplanar reformatted images are provided for review. MIP images are provided for review. Automated exposure control, iterative reconstruction, and/or weight based adjustment of the mA/kV was utilized to reduce the radiation dose to as low as reasonably achievable. COMPARISON: 10/29/2021 CLINICAL HISTORY: Pulmonary embolism (PE) suspected, high prob. FINDINGS: PULMONARY ARTERIES: Pulmonary arteries are adequately opacified for evaluation. No acute pulmonary embolus. Main pulmonary artery is normal in caliber. MEDIASTINUM: Diffuse coronary artery and moderate aortic calcifications. The heart and pericardium demonstrate no acute abnormality. There is no acute abnormality of the thoracic aorta. LYMPH NODES: No mediastinal, hilar or axillary lymphadenopathy. LUNGS AND PLEURA: Advanced emphysema. Scarring in the inferior right upper lobe. Consolidation in the posterior  right lower lobe, and possibly superimposed on scarring in the posterior inferior right upper lobe concerning for pneumonia. No evidence of pleural effusion or pneumothorax. UPPER ABDOMEN: Small layering gallstones within the gallbladder. SOFT TISSUES AND BONES: No acute bone or soft tissue abnormality. IMPRESSION: 1. No pulmonary embolism. 2. Findings concerning for pneumonia in the right lower lobe and possibly superimposed on scarring in the posterior inferior right upper lobe. 3. Advanced emphysema. 4. Diffuse coronary artery disease. Aortic atherosclerosis. 5. Cholelithiasis. Electronically signed by: Franky Crease MD 12/17/2023 08:43 PM EST RP Workstation: [id number]   DG Chest Port 1 View Result Date: 12/17/2023 EXAM: 1 VIEW(S) XRAY OF THE CHEST 12/17/2023 06:28:00 PM COMPARISON: 11/20/2022 CLINICAL HISTORY: Shortness of breath. FINDINGS: LUNGS AND PLEURA: Linear densities in the lung bases could reflect scarring or atelectasis. Patchy ill-defined right mid lung opacity. No pleural effusion. No pneumothorax. HEART AND MEDIASTINUM: Tortuous aorta. Aortic atherosclerosis. BONES AND SOFT TISSUES: No acute osseous abnormality. IMPRESSION: 1. Patchy ill-defined right mid lung opacity, which may reflect infection or inflammation. 2. Linear basilar opacities likely representing atelectasis or scarring. Electronically signed by: Franky Crease MD 12/17/2023 07:12 PM EST RP Workstation: [id number]      Subjective: Patient seen and examined at bedside.  Feels better and wants to go home today.  No fever, vomiting, worsening shortness of breath reported.  Discharge Exam: Vitals:   12/20/23 0903 12/20/23 0916  BP:  (!) 176/80  Pulse:    Resp:    Temp:    SpO2: 94%     General: Pt is alert, awake, not in acute distress.  On 3 L oxygen  via nasal cannula.  Elderly male lying in bed. Cardiovascular: rate controlled, S1/S2 + Respiratory: bilateral decreased breath sounds at bases with very minimal  wheezing. Abdominal: Soft, NT, ND, bowel sounds + Extremities: Mild lower extremity edema present; no cyanosis    The results of significant diagnostics from this hospitalization (including imaging, microbiology, ancillary and laboratory) are listed below for reference.     Microbiology: Recent Results (from the past 240 hours)  Resp panel by RT-PCR (RSV, Flu A&B, Covid) Anterior Nasal Swab     Status: None   Collection Time: 12/17/23  7:07 PM   Specimen: Anterior Nasal Swab  Result Value Ref Range Status   SARS Coronavirus 2 by RT PCR NEGATIVE NEGATIVE Final    Comment: (NOTE) SARS-CoV-2 target nucleic acids are NOT DETECTED.  The SARS-CoV-2 RNA is generally detectable in upper respiratory specimens during the acute phase of infection. The lowest concentration of SARS-CoV-2 viral copies this assay can detect is 138  copies/mL. A negative result does not preclude SARS-Cov-2 infection and should not be used as the sole basis for treatment or other patient management decisions. A negative result may occur with  improper specimen collection/handling, submission of specimen other than nasopharyngeal swab, presence of viral mutation(s) within the areas targeted by this assay, and inadequate number of viral copies(<138 copies/mL). A negative result must be combined with clinical observations, patient history, and epidemiological information. The expected result is Negative.  Fact Sheet for Patients:  bloggercourse.com  Fact Sheet for Healthcare Providers:  seriousbroker.it  This test is no t yet approved or cleared by the [place]  FDA and  has been authorized for detection and/or diagnosis of SARS-CoV-2 by FDA under an Emergency Use Authorization (EUA). This EUA will remain  in effect (meaning this test can be used) for the duration of the COVID-19 declaration under Section 564(b)(1) of the Act, 21 U.S.C.section 360bbb-3(b)(1),  unless the authorization is terminated  or revoked sooner.       Influenza A by PCR NEGATIVE NEGATIVE Final   Influenza B by PCR NEGATIVE NEGATIVE Final    Comment: (NOTE) The Xpert Xpress SARS-CoV-2/FLU/RSV plus assay is intended as an aid in the diagnosis of influenza from Nasopharyngeal swab specimens and should not be used as a sole basis for treatment. Nasal washings and aspirates are unacceptable for Xpert Xpress SARS-CoV-2/FLU/RSV testing.  Fact Sheet for Patients: bloggercourse.com  Fact Sheet for Healthcare Providers: seriousbroker.it  This test is not yet approved or cleared by the [place]  FDA and has been authorized for detection and/or diagnosis of SARS-CoV-2 by FDA under an Emergency Use Authorization (EUA). This EUA will remain in effect (meaning this test can be used) for the duration of the COVID-19 declaration under Section 564(b)(1) of the Act, 21 U.S.C. section 360bbb-3(b)(1), unless the authorization is terminated or revoked.     Resp Syncytial Virus by PCR NEGATIVE NEGATIVE Final    Comment: (NOTE) Fact Sheet for Patients: bloggercourse.com  Fact Sheet for Healthcare Providers: seriousbroker.it  This test is not yet approved or cleared by the [place]  FDA and has been authorized for detection and/or diagnosis of SARS-CoV-2 by FDA under an Emergency Use Authorization (EUA). This EUA will remain in effect (meaning this test can be used) for the duration of the COVID-19 declaration under Section 564(b)(1) of the Act, 21 U.S.C. section 360bbb-3(b)(1), unless the authorization is terminated or revoked.  Performed at [facility], [address], [place], [place] [address]      Labs: BNP (last 3 results) No results for input(s): BNP in the last 8760 hours. Basic Metabolic Panel: Recent Labs  Lab 12/17/23 1908  12/18/23 0458  NA 135 135  K 4.6 4.7  CL 98 99  CO2 27 25  GLUCOSE 131* 231*  BUN 20 23  CREATININE 1.19 1.19  CALCIUM  9.5 9.2  MG 2.2  --    Liver Function Tests: Recent Labs  Lab 12/17/23 1908  AST 19  ALT 9  ALKPHOS 99  BILITOT 0.7  PROT 8.5*  ALBUMIN  4.1   No results for input(s): LIPASE, AMYLASE in the last 168 hours. No results for input(s): AMMONIA in the last 168 hours. CBC: Recent Labs  Lab 12/17/23 1908 12/18/23 0458  WBC 7.6 5.4  NEUTROABS 5.7  --   HGB 12.1* 11.5*  HCT 37.0* 35.6*  MCV 96.9 97.5  PLT 163 162   Cardiac Enzymes: No results for input(s): CKTOTAL, CKMB, CKMBINDEX, TROPONINI in the last  168 hours. BNP: Invalid input(s): POCBNP CBG: Recent Labs  Lab 12/19/23 0736 12/19/23 1121 12/19/23 1600 12/19/23 2217 12/20/23 0727  GLUCAP 181* 126* 218* 120* 155*   D-Dimer No results for input(s): DDIMER in the last 72 hours. Hgb A1c No results for input(s): HGBA1C in the last 72 hours. Lipid Profile No results for input(s): CHOL, HDL, LDLCALC, TRIG, CHOLHDL, LDLDIRECT in the last 72 hours. Thyroid  function studies No results for input(s): TSH, T4TOTAL, T3FREE, THYROIDAB in the last 72 hours.  Invalid input(s): FREET3 Anemia work up No results for input(s): VITAMINB12, FOLATE, FERRITIN, TIBC, IRON, RETICCTPCT in the last 72 hours. Urinalysis    Component Value Date/Time   COLORURINE YELLOW 06/18/2022 1249   APPEARANCEUR CLEAR 06/18/2022 1249   LABSPEC 1.020 06/18/2022 1249   PHURINE 7.0 06/18/2022 1249   GLUCOSEU NEGATIVE 06/18/2022 1249   HGBUR NEGATIVE 06/18/2022 1249   BILIRUBINUR NEGATIVE 06/18/2022 1249   KETONESUR NEGATIVE 06/18/2022 1249   PROTEINUR 30 (A) 06/18/2022 1249   UROBILINOGEN 1.0 04/21/2014 1425   NITRITE NEGATIVE 06/18/2022 1249   LEUKOCYTESUR NEGATIVE 06/18/2022 1249   Sepsis Labs Recent Labs  Lab 12/17/23 1908 12/18/23 0458  WBC 7.6 5.4    Microbiology Recent Results (from the past 240 hours)  Resp panel by RT-PCR (RSV, Flu A&B, Covid) Anterior Nasal Swab     Status: None   Collection Time: 12/17/23  7:07 PM   Specimen: Anterior Nasal Swab  Result Value Ref Range Status   SARS Coronavirus 2 by RT PCR NEGATIVE NEGATIVE Final    Comment: (NOTE) SARS-CoV-2 target nucleic acids are NOT DETECTED.  The SARS-CoV-2 RNA is generally detectable in upper respiratory specimens during the acute phase of infection. The lowest concentration of SARS-CoV-2 viral copies this assay can detect is 138 copies/mL. A negative result does not preclude SARS-Cov-2 infection and should not be used as the sole basis for treatment or other patient management decisions. A negative result may occur with  improper specimen collection/handling, submission of specimen other than nasopharyngeal swab, presence of viral mutation(s) within the areas targeted by this assay, and inadequate number of viral copies(<138 copies/mL). A negative result must be combined with clinical observations, patient history, and epidemiological information. The expected result is Negative.  Fact Sheet for Patients:  bloggercourse.com  Fact Sheet for Healthcare Providers:  seriousbroker.it  This test is no t yet approved or cleared by the [place]  FDA and  has been authorized for detection and/or diagnosis of SARS-CoV-2 by FDA under an Emergency Use Authorization (EUA). This EUA will remain  in effect (meaning this test can be used) for the duration of the COVID-19 declaration under Section 564(b)(1) of the Act, 21 U.S.C.section 360bbb-3(b)(1), unless the authorization is terminated  or revoked sooner.       Influenza A by PCR NEGATIVE NEGATIVE Final   Influenza B by PCR NEGATIVE NEGATIVE Final    Comment: (NOTE) The Xpert Xpress SARS-CoV-2/FLU/RSV plus assay is intended as an aid in the diagnosis of influenza  from Nasopharyngeal swab specimens and should not be used as a sole basis for treatment. Nasal washings and aspirates are unacceptable for Xpert Xpress SARS-CoV-2/FLU/RSV testing.  Fact Sheet for Patients: bloggercourse.com  Fact Sheet for Healthcare Providers: seriousbroker.it  This test is not yet approved or cleared by the [place]  FDA and has been authorized for detection and/or diagnosis of SARS-CoV-2 by FDA under an Emergency Use Authorization (EUA). This EUA will remain in effect (meaning this test can be used) for  the duration of the COVID-19 declaration under Section 564(b)(1) of the Act, 21 U.S.C. section 360bbb-3(b)(1), unless the authorization is terminated or revoked.     Resp Syncytial Virus by PCR NEGATIVE NEGATIVE Final    Comment: (NOTE) Fact Sheet for Patients: bloggercourse.com  Fact Sheet for Healthcare Providers: seriousbroker.it  This test is not yet approved or cleared by the [place]  FDA and has been authorized for detection and/or diagnosis of SARS-CoV-2 by FDA under an Emergency Use Authorization (EUA). This EUA will remain in effect (meaning this test can be used) for the duration of the COVID-19 declaration under Section 564(b)(1) of the Act, 21 U.S.C. section 360bbb-3(b)(1), unless the authorization is terminated or revoked.  Performed at [facility], [address], [place], [place] [address]      Time coordinating discharge: 35 minutes  SIGNED:   Sophie Mao, MD  [facility] 12/20/2023, 9:29 AM

## 2023-12-20 NOTE — TOC Transition Note (Signed)
 Transition of Care [facility]) - Discharge Note   Patient Details  Name: Johnathan [name] MRN: [id number] Date of Birth: Feb 08, 1932  [facility] CM/SW Contact:  Sonda Manuella Quill, RN Phone Number: 12/20/2023, 9:34 AM   Clinical Narrative:    D/C orders received; no IP CM needs.   Final next level of care: Home/Self Care Barriers to Discharge: No Barriers Identified   Patient Goals and CMS Choice Patient states their goals for this hospitalization and ongoing recovery are:: Home CMS Medicare.gov Compare Post Acute Care list provided to:: Patient Represenative (must comment) (Karen(Niece)) Choice offered to / list presented to : Adult Children [place] ownership interest in [facility].provided to:: Adult Children    Discharge Placement                       Discharge Plan and Services Additional resources added to the After Visit Summary for     Discharge Planning Services: CM Consult Post Acute Care Choice: Home Health          DME Arranged: N/A DME Agency: NA       HH Arranged: NA HH Agency: NA        Social Drivers of Health (SDOH) Interventions SDOH Screenings   Food Insecurity: No Food Insecurity (12/17/2023)  Housing: Low Risk  (12/17/2023)  Transportation Needs: No Transportation Needs (12/17/2023)  Utilities: Not At Risk (12/17/2023)  Alcohol Screen: Low Risk  (01/28/2023)  Depression (PHQ2-9): Low Risk  (10/29/2023)  Financial Resource Strain: Low Risk  (01/28/2023)  Physical Activity: Inactive (01/28/2023)  Social Connections: Moderately Integrated (12/17/2023)  Stress: No Stress Concern Present (01/28/2023)  Tobacco Use: Medium Risk (12/19/2023)  Health Literacy: Adequate Health Literacy (01/28/2023)     Readmission Risk Interventions    06/24/2022   10:55 AM 06/20/2022   11:41 AM 09/14/2021    9:56 AM  Readmission Risk Prevention Plan  Post Dischage Appt  Complete Complete  Medication Screening  Complete Complete   Transportation Screening Complete Complete Complete  PCP or Specialist Appt within 5-7 Days Complete    Home Care Screening Complete    Medication Review (RN CM) Complete

## 2023-12-21 NOTE — Progress Notes (Unsigned)
 [facility] [address] [place], [place] [address] [phone]  Date:  12/24/2023   Name:  Johnathan [name]   DOB:  31-Dec-1932   MRN:  [id number]  PCP:  Watt Harlene BROCKS, MD    Chief Complaint: No chief complaint on file.   History of Present Illness:  Johnathan [name] is a 89 y.o. very pleasant male patient who presents with the following:  Patient seen today for follow-up.  I saw him most recently in October History of emphysema, diabetes, COPD, hypertension, former smoker. Janos had been doing excellently for age until he got COVID in February 2022; he subsequently developed pulmonary fibrosis and is still using oxygen    He was admitted to the hospital recently from 11/26 through 11/29-he was noted to have likely pneumonia Acute on chronic hypoxic respiratory failure COPD exacerbation Probable community-acquired bacterial pneumonia: Bacterial unspecified - On 2 L nasal cannula oxygen  at home: Currently on 3 L.  - Imaging as above. - Treated with IV Rocephin  and Zithromax .  Strep pneumo urinary antigen negative.  Currently on Solu-Medrol  and nebs -During the hospitalization, his condition has improved.  He feels much better today and feels okay to go home today.  He will be discharged home today on oral Ceftin  for 4 days and prednisone  40 mg daily for 7 days.  Outpatient follow-up with PCP.   Diabetes mellitus type 2 - Continue metformin .  Carb modified diet.  Outpatient follow-up with PCP.   Hypertension Hyperlipidemia - Continue amlodipine  and statin  Foot exam can be updated Lab Results  Component Value Date   HGBA1C 6.7 (H) 10/29/2023   Discussed the use of AI scribe software for clinical note transcription with the patient, who gave verbal consent to proceed.  History of Present Illness     Patient Active Problem List   Diagnosis Date Noted   Severe sepsis (HCC) 06/18/2022   Hyperlipidemia  associated with type 2 diabetes mellitus (HCC) 06/18/2022   Preop pulmonary/respiratory exam 04/09/2022   GERD (gastroesophageal reflux disease) 09/11/2021   Acute on chronic respiratory failure with hypoxia (HCC) 09/10/2021   Interstitial pulmonary disease (HCC) 03/19/2021   Hypertension associated with diabetes (HCC) 03/19/2021   Oral candidiasis 03/05/2021   Chronic respiratory failure with hypoxia (HCC) 03/05/2021   Trigger finger, right ring finger 02/13/2021   COPD with chronic bronchitis and emphysema (HCC) 04/26/2020   COVID-19 02/25/2020   Shingles 11/14/2019   Raynaud's syndrome 11/14/2019   Skin cancer 11/14/2019   Left inguinal hernia s/p lap repair w mesh 05/06/2019 05/06/2019   Recurrent right inguinal hernia s/p lap repair w mesh 05/06/2019 03/01/2019   Type 2 diabetes mellitus (HCC) 03/01/2019   Community acquired pneumonia of right lower lobe of lung 04/21/2014   COPD with acute exacerbation (HCC) 04/21/2014    Past Medical History:  Diagnosis Date   Anal fissure    COPD (chronic obstructive pulmonary disease) (HCC)    Diverticulosis    DM type 2 (diabetes mellitus, type 2) (HCC)    GERD (gastroesophageal reflux disease)    uses prilosec    Hypertension    Pneumonia 2018   Raynaud's disease    Shortness of breath dyspnea    WITH EXERTION   Sinusitis    Tremors of nervous system     Past Surgical History:  Procedure Laterality Date   COLONOSCOPY  03/27/2005   ESOPHAGOGASTRODUODENOSCOPY (EGD) WITH PROPOFOL  N/A 02/08/2021  Procedure: ESOPHAGOGASTRODUODENOSCOPY (EGD) WITH PROPOFOL ;  Surgeon: Teressa Toribio SQUIBB, MD;  Location: [facility];  Service: Endoscopy;  Laterality: N/A;   HERNIA REPAIR     INGUINAL HERNIA REPAIR N/A 05/06/2019   Procedure: LAPAROSCOPIC RIGHT AND LEFT INGUINAL HERNIA(S) REPAIR;  Surgeon: Sheldon Standing, MD;  Location: [facility];  Service: General;  Laterality: N/A;    Social History   Tobacco Use   Smoking status: Former     Current packs/day: 0.00    Average packs/day: 1 pack/day for 45.0 years (45.0 ttl pk-yrs)    Types: Cigarettes    Start date: 01/21/1949    Quit date: 01/21/1994    Years since quitting: 29.9   Smokeless tobacco: Never  Substance Use Topics   Alcohol use: Yes    Comment: a drink daily with dinner   Drug use: No    Family History  Problem Relation Age of Onset   Dementia Brother    Diabetes Brother    Heart attack Father    Dementia Father     No Known Allergies  Medication list has been reviewed and updated.  Current Outpatient Medications on File Prior to Visit  Medication Sig Dispense Refill   amLODipine  (NORVASC ) 5 MG tablet Take 1 tablet (5 mg total) by mouth daily. 90 tablet 3   aspirin  EC 81 MG tablet Take 81 mg by mouth every morning.     B Complex-C (SUPER B COMPLEX PO) Take 1 tablet by mouth at bedtime.     benzonatate  (TESSALON ) 100 MG capsule Take 1 capsule (100 mg total) by mouth 2 (two) times daily as needed for cough. 20 capsule 0   cefUROXime (CEFTIN) 500 MG tablet Take 1 tablet (500 mg total) by mouth 2 (two) times daily for 4 days. 8 tablet 0   Flaxseed, Linseed, (FLAXSEED OIL) 1200 MG CAPS Take 1,200 mg by mouth at bedtime.     Fluticasone -Umeclidin-Vilant (TRELEGY ELLIPTA ) 100-62.5-25 MCG/ACT AEPB Inhale 1 puff into the lungs daily. Rinse mouth after use (Patient taking differently: Inhale 1 puff into the lungs at bedtime. Rinse mouth after use) 180 each 1   Ginkgo Biloba EXTR Take 1 capsule by mouth 2 (two) times daily.     guaiFENesin  (MUCINEX ) 600 MG 12 hr tablet Take 1 tablet (600 mg total) by mouth 2 (two) times daily as needed for cough. 30 tablet 0   Investigational - Study Medication Inject 450 mg into the skin See admin instructions. STUDY [id number] 450 mg or placebo SQ pre-filled syringe 450 mg     ketoconazole  (NIZORAL ) 2 % cream Apply 1 Application topically daily. Use as needed for fungal infection (Patient not taking: No sig reported) 15  g 1   ketoconazole  (NIZORAL ) 2 % shampoo Apply 1 Application topically 2 (two) times a week. Use as needed for itchy scalp (Patient not taking: No sig reported) 120 mL PRN   loratadine  (CLARITIN ) 10 MG tablet Take 10 mg by mouth in the morning.     meloxicam  (MOBIC ) 15 MG tablet TAKE 1 TABLET BY MOUTH IN THE MORNING 90 tablet 0   metFORMIN  (GLUCOPHAGE ) 500 MG tablet Take 0.5 tablets (250 mg total) by mouth daily. (Patient taking differently: Take 250 mg by mouth daily with breakfast.) 45 tablet 3   Misc Natural Products (HIMALAYAN GOJI PO) Take by mouth See admin instructions. Goji berries - Eat a handful of berries by mouth once a day     Multiple Vitamins-Minerals (ONE-A-DAY MENS 50+) TABS Take  1 tablet by mouth daily with breakfast.     MYRBETRIQ  50 MG TB24 tablet Take 1 tablet (50 mg total) by mouth daily. 90 tablet 0   OHTUVAYRE  3 MG/2.5ML SUSP Inhale 3 mg into the lungs 2 (two) times daily.     omeprazole  (PRILOSEC) 20 MG capsule Take 1 capsule (20 mg total) by mouth 2 (two) times daily. (Patient taking differently: Take 20 mg by mouth 2 (two) times daily before a meal.) 180 capsule 3   OXYGEN  Inhale 2 L/min into the lungs continuous.     pravastatin  (PRAVACHOL ) 40 MG tablet Take 0.5 tablets (20 mg total) by mouth daily. 45 tablet 1   predniSONE  (DELTASONE ) 20 MG tablet Take 2 tablets (40 mg total) by mouth daily with breakfast for 7 days. 14 tablet 0   primidone  (MYSOLINE ) 50 MG tablet Take 1 tablet (50 mg total) by mouth 2 (two) times daily. 180 tablet 1   SYSTANE COMPLETE PF 0.6 % SOLN Place 1 drop into both eyes daily.     tadalafil  (CIALIS ) 20 MG tablet TAKE 1/2 TO 1 (ONE-HALF TO ONE) TABLET BY MOUTH AS NEEDED 2 HOURS PRIOR TO SEXUAL ACTIVITY (Patient taking differently: Take 10-20 mg by mouth daily as needed (2 hours prior to sexual activity).) 15 tablet 0   terazosin  (HYTRIN ) 1 MG capsule Take 1 capsule by mouth twice daily 180 capsule 0   triamcinolone  cream (KENALOG ) 0.1 % Apply 1  Application topically 2 (two) times daily as needed (rash/itching).     TYLENOL  500 MG tablet Take 500 mg by mouth every 6 (six) hours as needed for mild pain (pain score 1-3).     VENTOLIN  HFA 108 (90 Base) MCG/ACT inhaler Inhale 2 puffs into the lungs every 6 (six) hours as needed for wheezing or shortness of breath. 18 g 5   VITAMIN E PO Take 1 capsule by mouth daily with breakfast.     No current facility-administered medications on file prior to visit.    Review of Systems:  As per HPI- otherwise negative.   Physical Examination: There were no vitals filed for this visit. There were no vitals filed for this visit. There is no height or weight on file to calculate BMI. Ideal Body Weight:    GEN: no acute distress. HEENT: Atraumatic, Normocephalic.  Ears and Nose: No external deformity. CV: RRR, No M/G/R. No JVD. No thrill. No extra heart sounds. PULM: CTA B, no wheezes, crackles, rhonchi. No retractions. No resp. distress. No accessory muscle use. ABD: S, NT, ND, +BS. No rebound. No HSM. EXTR: No c/c/e PSYCH: Normally interactive. Conversant.    Assessment and Plan: No diagnosis found.  Assessment & Plan   Signed Harlene Schroeder, MD

## 2023-12-22 ENCOUNTER — Encounter (HOSPITAL_COMMUNITY)

## 2023-12-22 ENCOUNTER — Other Ambulatory Visit: Payer: Self-pay | Admitting: Family Medicine

## 2023-12-22 ENCOUNTER — Telehealth (HOSPITAL_COMMUNITY): Payer: Self-pay

## 2023-12-22 ENCOUNTER — Telehealth: Payer: Self-pay

## 2023-12-22 ENCOUNTER — Telehealth: Payer: Self-pay | Admitting: Family Medicine

## 2023-12-22 NOTE — Telephone Encounter (Signed)
 Copied from CRM #[id number]. Topic: General - Other >> Dec 22, 2023 10:59 AM Avram MATSU wrote: Reason for CRM: Dorothe is calling from [facility] patient RN and stated he was seen for pneumonia at the hospital and discharged the 28th. He has a follow up appt tomorrow with his pulmonologist MD Praveen Mannam

## 2023-12-22 NOTE — Telephone Encounter (Signed)
 Patient rescheduled PR orientation due to needing a few more days of rest after d/c from hospital on 11/28. Rescheduled to 12/05 @ 1:00pm.

## 2023-12-22 NOTE — Transitions of Care (Post Inpatient/ED Visit) (Signed)
   12/22/2023  Name: Johnathan [name] MRN: [id number] DOB: 06/17/1932  Today's TOC FU Call Status: Today's TOC FU Call Status:: Unsuccessful Call (1st Attempt) Unsuccessful Call (1st Attempt) Date: 12/22/23  Attempted to reach the patient regarding the most recent Inpatient/ED visit.  Follow Up Plan: Additional outreach attempts will be made to reach the patient to complete the Transitions of Care (Post Inpatient/ED visit) call.   Medford Balboa, BSN, RN [place]  VBCI - [employer] [facility] RN Care Manager [phone]

## 2023-12-23 ENCOUNTER — Encounter: Payer: Self-pay | Admitting: Pulmonary Disease

## 2023-12-23 ENCOUNTER — Telehealth: Payer: Self-pay

## 2023-12-23 ENCOUNTER — Ambulatory Visit: Admitting: Pulmonary Disease

## 2023-12-23 VITALS — BP 156/72 | HR 83 | Ht 69.0 in | Wt 152.0 lb

## 2023-12-23 DIAGNOSIS — J189 Pneumonia, unspecified organism: Secondary | ICD-10-CM | POA: Diagnosis not present

## 2023-12-23 DIAGNOSIS — Z87891 Personal history of nicotine dependence: Secondary | ICD-10-CM | POA: Diagnosis not present

## 2023-12-23 DIAGNOSIS — J441 Chronic obstructive pulmonary disease with (acute) exacerbation: Secondary | ICD-10-CM | POA: Diagnosis not present

## 2023-12-23 NOTE — Transitions of Care (Post Inpatient/ED Visit) (Signed)
   12/23/2023  Name: Johnathan [name] MRN: [id number] DOB: Jun 12, 1932  Today's TOC FU Call Status: Today's TOC FU Call Status:: Unsuccessful Call (2nd Attempt) Unsuccessful Call (1st Attempt) Date: 12/22/23 Unsuccessful Call (2nd Attempt) Date: 12/23/23  Attempted to reach the patient regarding the most recent Inpatient/ED visit.  Follow Up Plan: Additional outreach attempts will be made to reach the patient to complete the Transitions of Care (Post Inpatient/ED visit) call.   Medford Balboa, BSN, RN [place]  VBCI - [employer] [facility] RN Care Manager [phone]

## 2023-12-23 NOTE — Progress Notes (Signed)
 [facility]    [id number]    August 13, 1932  Primary Care Physician:Copland, Harlene BROCKS, MD  Referring Physician: Watt Harlene BROCKS, MD [address] [place],  [place] [address]  Chief complaint: Follow-up for asthma, COPD, post COVID-19  HPI: 89 y.o.  ex-smoker with history of emphysema [no PFTs on record], Raynaud's syndrome Maintained on Symbicort  for many years  Hospitalized for COVID-19 in early February 2022 in spite of getting the booster vaccine. He was treated with IV remdesivir , steroids, discharged on supplemental oxygen  Post discharge he continues to have persistent dyspnea on exertion, hypoxia whenever he stops oxygen .  Follow-up chest x-ray shows persistent lung infiltrate and he has been referred to pulmonary for further evaluation Advair changed to Trelegy in 2021.  He feels that this is working better for him He experienced a severe COPD exacerbation, pneumonia, and sepsis in May 2025, requiring hospitalization Started in Ohtuvayre  nebulizer in November 2025  Interim history: Discussed the use of AI scribe software for clinical note transcription with the patient, who gave verbal consent to proceed. History of Present Illness  89 year old male with severe COPD presents for follow-up after hospitalization for COPD exacerbation and pneumonia.  Copd exacerbation and pneumonia - Hospitalized over Thanksgiving 2025 for acute exacerbation of severe COPD and right upper and lower lobe pneumonia - Received intravenous antibiotics and steroids during hospitalization - Discharged on 7 days of oral antibiotics and prednisone , which he is still taking - Currently feels fairly well  Chronic obstructive pulmonary disease management - Uses Trelegy nightly for maintenance therapy - Uses Otovair nebulizer twice daily, started several weeks ago; adherent despite disliking the nebulizer - Uses Ventolin  as needed for symptom relief  Pulmonary  rehabilitation - Referred to pulmonary rehabilitation - Missed initial visit due to recent hospitalization  Respiratory symptoms - Occasionally experiences a congestive feeling - Plans to use Mucinex  for symptom relief - Sometimes uses bourbon for relief of congestion  Substance use - Previously smoked marijuana; agreed to abstain from smoking currently - Considering use of edible cannabis but has not started   Relevant pulmonary history Pets: No pets Occupation: Worked miscellaneous jobs including farm, research officer, political party, 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  No feather pillows or comforter. Smoking history: 45-pack-year smoker.  Quit in the 1990s Travel history: No significant travel history Relevant family history: Father had COPD.  He was a smoker.  Outpatient Encounter Medications as of 12/23/2023  Medication Sig   amLODipine  (NORVASC ) 5 MG tablet Take 1 tablet (5 mg total) by mouth daily.   aspirin  EC 81 MG tablet Take 81 mg by mouth every morning.   B Complex-C (SUPER B COMPLEX PO) Take 1 tablet by mouth at bedtime.   cefUROXime (CEFTIN) 500 MG tablet Take 1 tablet (500 mg total) by mouth 2 (two) times daily for 4 days.   Flaxseed, Linseed, (FLAXSEED OIL) 1200 MG CAPS Take 1,200 mg by mouth at bedtime.   Fluticasone -Umeclidin-Vilant (TRELEGY ELLIPTA ) 100-62.5-25 MCG/ACT AEPB Inhale 1 puff into the lungs daily. Rinse mouth after use (Patient taking differently: Inhale 1 puff into the lungs at bedtime. Rinse mouth after use)   Ginkgo Biloba EXTR Take 1 capsule by mouth 2 (two) times daily.   guaiFENesin  (MUCINEX ) 600 MG 12 hr tablet Take 1 tablet (600 mg total) by mouth 2 (two) times daily as needed for cough.   Investigational - Study Medication Inject 450 mg into the skin  See admin instructions. STUDY [id number] 450 mg or placebo SQ pre-filled syringe 450 mg   loratadine  (CLARITIN ) 10 MG tablet Take 10 mg by mouth in the morning.   meloxicam  (MOBIC ) 15 MG tablet TAKE  1 TABLET BY MOUTH IN THE MORNING   metFORMIN  (GLUCOPHAGE ) 500 MG tablet Take 0.5 tablets (250 mg total) by mouth daily. (Patient taking differently: Take 250 mg by mouth daily with breakfast.)   Misc Natural Products (HIMALAYAN GOJI PO) Take by mouth See admin instructions. Goji berries - Eat a handful of berries by mouth once a day   Multiple Vitamins-Minerals (ONE-A-DAY MENS 50+) TABS Take 1 tablet by mouth daily with breakfast.   MYRBETRIQ  50 MG TB24 tablet Take 1 tablet (50 mg total) by mouth daily.   OHTUVAYRE  3 MG/2.5ML SUSP Inhale 3 mg into the lungs 2 (two) times daily.   omeprazole  (PRILOSEC) 20 MG capsule Take 1 capsule (20 mg total) by mouth 2 (two) times daily. (Patient taking differently: Take 20 mg by mouth 2 (two) times daily before a meal.)   OXYGEN  Inhale 2 L/min into the lungs continuous.   pravastatin  (PRAVACHOL ) 40 MG tablet Take 0.5 tablets (20 mg total) by mouth daily.   predniSONE  (DELTASONE ) 20 MG tablet Take 2 tablets (40 mg total) by mouth daily with breakfast for 7 days.   primidone  (MYSOLINE ) 50 MG tablet Take 1 tablet (50 mg total) by mouth 2 (two) times daily.   SYSTANE COMPLETE PF 0.6 % SOLN Place 1 drop into both eyes daily.   tadalafil  (CIALIS ) 20 MG tablet TAKE 1/2 TO 1 (ONE-HALF TO ONE) TABLET BY MOUTH AS NEEDED 2 HOURS PRIOR TO SEXUAL ACTIVITY (Patient taking differently: Take 10-20 mg by mouth daily as needed (2 hours prior to sexual activity).)   terazosin  (HYTRIN ) 1 MG capsule Take 1 capsule by mouth twice daily   triamcinolone  cream (KENALOG ) 0.1 % Apply 1 Application topically 2 (two) times daily as needed (rash/itching).   TYLENOL  500 MG tablet Take 500 mg by mouth every 6 (six) hours as needed for mild pain (pain score 1-3).   VENTOLIN  HFA 108 (90 Base) MCG/ACT inhaler Inhale 2 puffs into the lungs every 6 (six) hours as needed for wheezing or shortness of breath.   VITAMIN E PO Take 1 capsule by mouth daily with breakfast.   benzonatate  (TESSALON ) 100 MG  capsule Take 1 capsule (100 mg total) by mouth 2 (two) times daily as needed for cough. (Patient not taking: Reported on 12/23/2023)   ketoconazole  (NIZORAL ) 2 % cream Apply 1 Application topically daily. Use as needed for fungal infection (Patient not taking: Reported on 12/23/2023)   ketoconazole  (NIZORAL ) 2 % shampoo Apply 1 Application topically 2 (two) times a week. Use as needed for itchy scalp (Patient not taking: Reported on 12/23/2023)   No facility-administered encounter medications on file as of 12/23/2023.   Vitals:   12/23/23 1518  BP: (!) 156/72  Pulse: 83  Height: 5' 9 (1.753 m)  Weight: 152 lb (68.9 kg)  SpO2: 92% Comment: 2l poc  TempSrc: Oral  BMI (Calculated): 22.44    Physical Exam GEN: No acute distress. CV: Regular rate and rhythm, no murmurs. LUNGS: Clear to auscultation bilaterally, normal respiratory effort, no wheezing. SKIN JOINTS: Warm and dry, no rash.    Data Reviewed: Imaging: Chest x-ray 02/20/2020-hyperinflation, no acute process Chest x-ray 02/25/2020-emphysema, mild left base opacity Chest x-ray 03/30/2020-bilateral infiltrates. CT high-resolution 04/28/2020- emphysema, peripheral sublenticular groundglass densities, calcified granuloma.  Indeterminate  pattern CTA 09/11/2021-no PE, reticular opacities in the mid lung CT chest 10/29/2021-emphysema, improvement in multifocal pulmonary infiltrates CTA 12/17/2023-no pulmonary embolism.  Infiltrate in the right upper lobe, right lower lobe.  Advanced emphysema. I have reviewed the images personally.  PFTs: 05/26/2020 FVC 2.41 [6%], FEV1 1.23 [48%], F/F 51, TLC 5.03 [73%], DLCO 7.92 [34%] Severe obstruction, severe diffusion defect, mild restriction  Labs: CBC 02/29/2020-WBC 5.3, eos 0% CBC 05/30/2020-WBC 5, eos 7.6%, absolute eosinophil count 380 CBC 12/17/2023-WBC 7.6, eos 2%, absolute eosinophil count 152  IgE 04/26/2020-858 IgE 11/27/2023-625  ANA, CCP, rheumatoid factor 04/26/2020- Negative Assessment &  Plan COPD with acute exacerbation Severe COPD with recent acute exacerbation requiring hospitalization. Currently on Trelegy at night and Otovayre via nebulizer twice daily. No wheezing on examination. Uses Ventolin  as a rescue inhaler if wheezing occurs. Reports occasional congestive feeling, managed with bourbon and Mucinex .  Recent eosinophils are low so he does not qualify for biologic injection therapy. - Continue Trelegy at night. - Continue Ohtuvayre  via nebulizer twice daily. - Use Ventolin  as a rescue inhaler if wheezing occurs. - Consider Mucinex  for congestive feeling.  Pneumonia Recent hospitalization for pneumonia in the right lower and upper lobes. Treated with IV antibiotics and steroids, followed by oral antibiotics and prednisone  for seven days. Currently feeling well with no respiratory distress. - Continue oral antibiotics and prednisone  as prescribed.  Referral to pulmonary rehabilitation Pulmonary rehabilitation may benefit breathing and overall management of COPD. Previous attendance 5-10 years ago. - Pulmonary rehabilitation at [facility].  Plan/Recommendations: Continue Trelegy, Ohtuvayre , supplemental oxygen  Pulmonary rehab  Lonna Coder MD [facility] 12/23/2023, 3:25 PM I left callback  CC: Copland, Harlene BROCKS, MD

## 2023-12-23 NOTE — Patient Instructions (Signed)
  VISIT SUMMARY: You had a follow-up visit after being hospitalized for a COPD exacerbation and pneumonia. You are currently feeling fairly well and are continuing your medications as prescribed.  YOUR PLAN: COPD WITH ACUTE EXACERBATION: You have severe COPD and recently had an acute exacerbation that required hospitalization. -Continue using Trelegy at night. -Continue using Ohtuvayre  via nebulizer twice daily. -Use Ventolin  as a rescue inhaler if wheezing occurs. -Consider using Mucinex  for congestive feeling.  PNEUMONIA: You were recently hospitalized for pneumonia in the right lower and upper lobes and treated with antibiotics and steroids. -Continue taking your oral antibiotics and prednisone  as prescribed.

## 2023-12-24 ENCOUNTER — Encounter: Payer: Self-pay | Admitting: Family Medicine

## 2023-12-24 ENCOUNTER — Ambulatory Visit: Admitting: Family Medicine

## 2023-12-24 ENCOUNTER — Telehealth: Payer: Self-pay

## 2023-12-24 VITALS — BP 150/80 | HR 79 | Temp 97.5°F | Ht 69.0 in | Wt 152.8 lb

## 2023-12-24 DIAGNOSIS — J441 Chronic obstructive pulmonary disease with (acute) exacerbation: Secondary | ICD-10-CM

## 2023-12-24 DIAGNOSIS — J439 Emphysema, unspecified: Secondary | ICD-10-CM

## 2023-12-24 DIAGNOSIS — J4489 Other specified chronic obstructive pulmonary disease: Secondary | ICD-10-CM | POA: Diagnosis not present

## 2023-12-24 NOTE — Transitions of Care (Post Inpatient/ED Visit) (Signed)
   12/24/2023  Name: Johnathan [name] MRN: [id number] DOB: May 16, 1932  Today's TOC FU Call Status: Unsuccessful Call (1st Attempt) Date: 12/22/23 Unsuccessful Call (2nd Attempt) Date: 12/23/23  Attempted to reach the patient regarding the most recent Inpatient/ED visit.  Follow Up Plan: No further outreach attempts will be made at this time. We have been unable to contact the patient.  Medford Balboa, BSN, RN [place]  VBCI - [employer] [facility] RN Care Manager [phone]

## 2023-12-24 NOTE — Patient Instructions (Signed)
 Good to see you today- let me know if you are getting worse over the next few days as the steroid wears off.

## 2023-12-25 ENCOUNTER — Telehealth (HOSPITAL_COMMUNITY): Payer: Self-pay

## 2023-12-25 NOTE — Telephone Encounter (Signed)
 Called to confirm appt. Pt confirmed appt. Instructed pt on proper footwear. Gave directions along with department number.

## 2023-12-26 ENCOUNTER — Encounter (HOSPITAL_COMMUNITY)
Admission: RE | Admit: 2023-12-26 | Discharge: 2023-12-26 | Disposition: A | Discharge status: Home / Self Care | Source: Ambulatory Visit | Attending: Pulmonary Disease | Admitting: Pulmonary Disease

## 2023-12-26 VITALS — BP 130/54 | Wt 155.4 lb

## 2023-12-26 DIAGNOSIS — J449 Chronic obstructive pulmonary disease, unspecified: Secondary | ICD-10-CM | POA: Insufficient documentation

## 2023-12-26 NOTE — Progress Notes (Signed)
 Johnathan [name] 89 y.o. male Pulmonary Rehab Orientation Note This patient who was referred to Pulmonary Rehab by Dr. Theophilus with the diagnosis of COPD 3 arrived today in Cardiac and Pulmonary Rehab. He arrived ambulatory with slow gait, using a cane. He does carry portable oxygen . [employer] is the provider for their DME. Per patient, [name] uses oxygen  continuously. Color good, skin warm and dry. Patient is oriented to time and place. Patient's medical history, psychosocial health, and medications reviewed. Psychosocial assessment reveals patient lives with his roommate, [name]. Wilmar is currently retired. Patient hobbies include watching tv. Patient reports his stress level is low. He does not report any areas of stress. Patient does not exhibit signs of depression. . PHQ2/9 score 0/1. Messi shows good  coping skills with positive outlook on life. Offered emotional support and reassurance. Will continue to monitor. Physical assessment performed by Ronal Levin RN. Please see their orientation physical assessment note. Simon reports he  does take medications as prescribed. Patient states he  follows a regular  diet. The patient reports no specific efforts to gain or lose weight.. Patient's weight will be monitored closely. Demonstration and practice of PLB using pulse oximeter. Kedarius able to return demonstration satisfactorily. Safety and hand hygiene in the exercise area reviewed with patient. Latroy voices understanding of the information reviewed. Department expectations discussed with patient and achievable goals were set. The patient shows enthusiasm about attending the program and we look forward to working with Johnathan. Telly completed a 6 min walk test today and is scheduled to begin exercise on 12/30/23 at 1:15 pm.  [phone] Cloyd Aliene Aris, MS, ACSM-CEP

## 2023-12-26 NOTE — Progress Notes (Signed)
 Pulmonary Individual Treatment Plan  Patient Details  Name: Johnathan [name] MRN: [id number] Date of Birth: February 14, 1932 Referring Provider:   [name] Pulmonary Rehab Walk Test from 12/26/2023 in [facility]  Referring Provider Mannam    Initial Encounter Date:  Flowsheet Row Pulmonary Rehab Walk Test from 12/26/2023 in [facility], [facility], & Lung Health  Date 12/26/23    Visit Diagnosis: Stage 3 severe COPD by GOLD classification (HCC)  Patient's Home Medications on Admission:  Current Outpatient Medications:    amLODipine  (NORVASC ) 5 MG tablet, Take 1 tablet (5 mg total) by mouth daily., Disp: 90 tablet, Rfl: 3   aspirin  EC 81 MG tablet, Take 81 mg by mouth every morning., Disp: , Rfl:    B Complex-C (SUPER B COMPLEX PO), Take 1 tablet by mouth at bedtime., Disp: , Rfl:    benzonatate  (TESSALON ) 100 MG capsule, Take 1 capsule (100 mg total) by mouth 2 (two) times daily as needed for cough., Disp: 20 capsule, Rfl: 0   Flaxseed, Linseed, (FLAXSEED OIL) 1200 MG CAPS, Take 1,200 mg by mouth at bedtime., Disp: , Rfl:    Fluticasone -Umeclidin-Vilant (TRELEGY ELLIPTA ) 100-62.5-25 MCG/ACT AEPB, Inhale 1 puff into the lungs daily. Rinse mouth after use, Disp: 180 each, Rfl: 1   Ginkgo Biloba EXTR, Take 1 capsule by mouth 2 (two) times daily., Disp: , Rfl:    guaiFENesin  (MUCINEX ) 600 MG 12 hr tablet, Take 1 tablet (600 mg total) by mouth 2 (two) times daily as needed for cough., Disp: 30 tablet, Rfl: 0   meloxicam  (MOBIC ) 15 MG tablet, TAKE 1 TABLET BY MOUTH IN THE MORNING, Disp: 90 tablet, Rfl: 0   metFORMIN  (GLUCOPHAGE ) 500 MG tablet, Take 0.5 tablets (250 mg total) by mouth daily., Disp: 45 tablet, Rfl: 3   Misc Natural Products (HIMALAYAN GOJI PO), Take by mouth See admin instructions. Goji berries - Eat a handful of berries by mouth once a day, Disp: , Rfl:    Multiple Vitamins-Minerals  (ONE-A-DAY MENS 50+) TABS, Take 1 tablet by mouth daily with breakfast., Disp: , Rfl:    MYRBETRIQ  50 MG TB24 tablet, Take 1 tablet (50 mg total) by mouth daily., Disp: 90 tablet, Rfl: 0   OHTUVAYRE  3 MG/2.5ML SUSP, Inhale 3 mg into the lungs 2 (two) times daily., Disp: , Rfl:    omeprazole  (PRILOSEC) 20 MG capsule, Take 1 capsule (20 mg total) by mouth 2 (two) times daily., Disp: 180 capsule, Rfl: 3   OXYGEN , Inhale 2 L/min into the lungs continuous., Disp: , Rfl:    pravastatin  (PRAVACHOL ) 40 MG tablet, Take 0.5 tablets (20 mg total) by mouth daily., Disp: 45 tablet, Rfl: 1   predniSONE  (DELTASONE ) 20 MG tablet, Take 2 tablets (40 mg total) by mouth daily with breakfast for 7 days., Disp: 14 tablet, Rfl: 0   primidone  (MYSOLINE ) 50 MG tablet, Take 1 tablet (50 mg total) by mouth 2 (two) times daily., Disp: 180 tablet, Rfl: 1   SYSTANE COMPLETE PF 0.6 % SOLN, Place 1 drop into both eyes daily., Disp: , Rfl:    tadalafil  (CIALIS ) 20 MG tablet, TAKE 1/2 TO 1 (ONE-HALF TO ONE) TABLET BY MOUTH AS NEEDED 2 HOURS PRIOR TO SEXUAL ACTIVITY, Disp: 15 tablet, Rfl: 0   terazosin  (HYTRIN ) 1 MG capsule, Take 1 capsule by mouth twice daily, Disp: 180 capsule, Rfl: 0   triamcinolone  cream (KENALOG ) 0.1 %, Apply 1 Application topically  2 (two) times daily as needed (rash/itching)., Disp: , Rfl:    VENTOLIN  HFA 108 (90 Base) MCG/ACT inhaler, Inhale 2 puffs into the lungs every 6 (six) hours as needed for wheezing or shortness of breath., Disp: 18 g, Rfl: 5   VITAMIN E PO, Take 1 capsule by mouth daily with breakfast., Disp: , Rfl:    Investigational - Study Medication, Inject 450 mg into the skin See admin instructions. STUDY [id number] 450 mg or placebo SQ pre-filled syringe 450 mg, Disp: , Rfl:    ketoconazole  (NIZORAL ) 2 % cream, Apply 1 Application topically daily. Use as needed for fungal infection (Patient not taking: Reported on 12/26/2023), Disp: 15 g, Rfl: 1   ketoconazole  (NIZORAL ) 2 % shampoo,  Apply 1 Application topically 2 (two) times a week. Use as needed for itchy scalp (Patient not taking: Reported on 12/26/2023), Disp: 120 mL, Rfl: PRN   loratadine  (CLARITIN ) 10 MG tablet, Take 10 mg by mouth in the morning., Disp: , Rfl:    TYLENOL  500 MG tablet, Take 500 mg by mouth every 6 (six) hours as needed for mild pain (pain score 1-3)., Disp: , Rfl:   Past Medical History: Past Medical History:  Diagnosis Date   Anal fissure    COPD (chronic obstructive pulmonary disease) (HCC)    Diverticulosis    DM type 2 (diabetes mellitus, type 2) (HCC)    GERD (gastroesophageal reflux disease)    uses prilosec    Hypertension    Pneumonia 2018   Raynaud's disease    Shortness of breath dyspnea    WITH EXERTION   Sinusitis    Tremors of nervous system     Tobacco Use: Social History   Tobacco Use  Smoking Status Former   Current packs/day: 0.00   Average packs/day: 1 pack/day for 45.0 years (45.0 ttl pk-yrs)   Types: Cigarettes   Start date: 01/21/1949   Quit date: 01/21/1994   Years since quitting: 29.9  Smokeless Tobacco Never    Labs: Review Flowsheet  More data exists      Latest Ref Rng & Units 06/18/2022 11/20/2022 04/30/2023 10/29/2023 12/17/2023  Labs for ITP Cardiac and Pulmonary Rehab  Cholestrol 0 - 200 mg/dL - - - 856  -  LDL (calc) 0 - 99 mg/dL - - - 79  -  HDL-C >60.99 mg/dL - - - 63.69  -  Trlycerides 0.0 - 149.0 mg/dL - - - 859.9  -  Hemoglobin A1c 4.6 - 6.5 % - 6.6  6.4  6.7  -  Bicarbonate 20.0 - 28.0 mmol/L 29.8  - - - 29.2   TCO2 22 - 32 mmol/L 31  - - - -  O2 Saturation % 48  - - - 69      Pulmonary Assessment Scores:  Pulmonary Assessment Scores     Row Name 12/26/23 1414         ADL UCSD   ADL Phase Entry     SOB Score total 62       CAT Score   CAT Score 23       mMRC Score   mMRC Score 4        UCSD: Self-administered rating of dyspnea associated with activities of daily living (ADLs) 6-point scale (0 = not at all to 5 =  maximal or unable to do because of breathlessness)  Scoring Scores range from 0 to 120.  Minimally important difference is 5 units  CAT: CAT can identify the  health impairment of COPD patients and is better correlated with disease progression.  CAT has a scoring range of zero to 40. The CAT score is classified into four groups of low (less than 10), medium (10 - 20), high (21-30) and very high (31-40) based on the impact level of disease on health status. A CAT score over 10 suggests significant symptoms.  A worsening CAT score could be explained by an exacerbation, poor medication adherence, poor inhaler technique, or progression of COPD or comorbid conditions.  CAT MCID is 2 points  mMRC: mMRC (Modified Medical Research Council) Dyspnea Scale is used to assess the degree of baseline functional disability in patients of respiratory disease due to dyspnea. No minimal important difference is established. A decrease in score of 1 point or greater is considered a positive change.   Pulmonary Function Assessment:   Exercise Target Goals: Exercise Program Goal: Individual exercise prescription set using results from initial 6 min walk test and THRR while considering  patient's activity barriers and safety.   Exercise Prescription Goal: Initial exercise prescription builds to 30-45 minutes a day of aerobic activity, 2-3 days per week.  Home exercise guidelines will be given to patient during program as part of exercise prescription that the participant will acknowledge.  Education: Aerobic Exercise: - Group verbal and visual presentation on the components of exercise prescription. Introduces F.I.T.T principle from ACSM for exercise prescriptions.  Reviews F.I.T.T. principles of aerobic exercise including progression. Written material provided at class time.   Education: Resistance Exercise: - Group verbal and visual presentation on the components of exercise prescription. Introduces F.I.T.T  principle from ACSM for exercise prescriptions  Reviews F.I.T.T. principles of resistance exercise including progression. Written material provided at class time.    Education: Exercise & Equipment Safety: - Individual verbal instruction and demonstration of equipment use and safety with use of the equipment.   Education: Exercise Physiology & General Exercise Guidelines: - Group verbal and written instruction with models to review the exercise physiology of the cardiovascular system and associated critical values. Provides general exercise guidelines with specific guidelines to those with heart or lung disease.    Education: Flexibility, Balance, Mind/Body Relaxation: - Group verbal and visual presentation with interactive activity on the components of exercise prescription. Introduces F.I.T.T principle from ACSM for exercise prescriptions. Reviews F.I.T.T. principles of flexibility and balance exercise training including progression. Also discusses the mind body connection.  Reviews various relaxation techniques to help reduce and manage stress (i.e. Deep breathing, progressive muscle relaxation, and visualization). Balance handout provided to take home. Written material provided at class time.   Activity Barriers & Risk Stratification:  Activity Barriers & Cardiac Risk Stratification - 12/26/23 1417       Activity Barriers & Cardiac Risk Stratification   Activity Barriers Arthritis;Deconditioning;Muscular Weakness;Shortness of Breath;Balance Concerns;Assistive Device    Cardiac Risk Stratification High          6 Minute Walk:  6 Minute Walk     Row Name 12/26/23 1526         6 Minute Walk   Phase Initial     Distance 910 feet     Walk Time 6 minutes     # of Rest Breaks 0     MPH 1.72     METS 1.54     RPE 13     Perceived Dyspnea  1     VO2 Peak 5.4     Symptoms No     Resting HR 70 bpm  Resting BP 130/54     Resting Oxygen  Saturation  97 %     Exercise Oxygen   Saturation  during 6 min walk 90 %     Max Ex. HR 89 bpm     Max Ex. BP 154/60     2 Minute Post BP 144/60       Interval HR   1 Minute HR 82     2 Minute HR 86     3 Minute HR 85     4 Minute HR 86     5 Minute HR 89     6 Minute HR 81     2 Minute Post HR 71     Interval Heart Rate? Yes       Interval Oxygen    Interval Oxygen ? Yes     Baseline Oxygen  Saturation % 97 %     1 Minute Oxygen  Saturation % 97 %     1 Minute Liters of Oxygen  2 L     2 Minute Oxygen  Saturation % 95 %     2 Minute Liters of Oxygen  2 L     3 Minute Oxygen  Saturation % 95 %     3 Minute Liters of Oxygen  2 L     4 Minute Oxygen  Saturation % 92 %     4 Minute Liters of Oxygen  2 L     5 Minute Oxygen  Saturation % 90 %     5 Minute Liters of Oxygen  2 L     6 Minute Oxygen  Saturation % 91 %     6 Minute Liters of Oxygen  2 L     2 Minute Post Oxygen  Saturation % 95 %     2 Minute Post Liters of Oxygen  2 L       Oxygen  Initial Assessment:  Oxygen  Initial Assessment - 12/26/23 1413       Home Oxygen    Home Oxygen  Device Portable Concentrator;Home Concentrator    Sleep Oxygen  Prescription Continuous    Liters per minute 2    Home Exercise Oxygen  Prescription Continuous    Liters per minute 2    Home Resting Oxygen  Prescription Continuous    Liters per minute 2    Compliance with Home Oxygen  Use Yes      Initial 6 min Walk   Oxygen  Used Continuous    Liters per minute 2      Program Oxygen  Prescription   Program Oxygen  Prescription Continuous    Liters per minute 2    Comments Johnathan [name] is compliant with supplemental oxygen .      Intervention   Short Term Goals To learn and exhibit compliance with exercise, home and travel O2 prescription;To learn and understand importance of monitoring SPO2 with pulse oximeter and demonstrate accurate use of the pulse oximeter.;To learn and understand importance of maintaining oxygen  saturations>88%;To learn and demonstrate proper pursed lip breathing techniques or  other breathing techniques. ;To learn and demonstrate proper use of respiratory medications    Long  Term Goals Exhibits compliance with exercise, home  and travel O2 prescription;Verbalizes importance of monitoring SPO2 with pulse oximeter and return demonstration;Maintenance of O2 saturations>88%;Exhibits proper breathing techniques, such as pursed lip breathing or other method taught during program session;Compliance with respiratory medication;Demonstrates proper use of MDI's          Oxygen  Re-Evaluation:   Oxygen  Discharge (Final Oxygen  Re-Evaluation):   Initial Exercise Prescription:  Initial Exercise Prescription - 12/26/23 1500  Date of Initial Exercise RX and Referring Provider   Date 12/26/23    Referring Provider Mannam    Expected Discharge Date 03/25/24      Oxygen    Oxygen  Continuous    Liters 2    Maintain Oxygen  Saturation 88% or higher      NuStep   Level 2    SPM 60    Minutes 15    METs 1.8      Track   Laps 4    Minutes 15    METs 1.5      Prescription Details   Frequency (times per week) 2    Duration Progress to 30 minutes of continuous aerobic without signs/symptoms of physical distress      Intensity   THRR 40-80% of Max Heartrate 52-105    Ratings of Perceived Exertion 11-13    Perceived Dyspnea 0-4      Progression   Progression Continue to progress workloads to maintain intensity without signs/symptoms of physical distress.      Resistance Training   Training Prescription Yes    Weight --   red bands   Reps 10-15          Perform Capillary Blood Glucose checks as needed.  Exercise Prescription Changes:   Exercise Comments:   Exercise Goals and Review:   Exercise Goals     Row Name 12/26/23 1418             Exercise Goals   Increase Physical Activity Yes       Intervention Provide advice, education, support and counseling about physical activity/exercise needs.;Develop an individualized exercise  prescription for aerobic and resistive training based on initial evaluation findings, risk stratification, comorbidities and participant's personal goals.       Expected Outcomes Short Term: Attend rehab on a regular basis to increase amount of physical activity.;Long Term: Add in home exercise to make exercise part of routine and to increase amount of physical activity.;Long Term: Exercising regularly at least 3-5 days a week.       Increase Strength and Stamina Yes       Intervention Provide advice, education, support and counseling about physical activity/exercise needs.;Develop an individualized exercise prescription for aerobic and resistive training based on initial evaluation findings, risk stratification, comorbidities and participant's personal goals.       Expected Outcomes Short Term: Increase workloads from initial exercise prescription for resistance, speed, and METs.;Short Term: Perform resistance training exercises routinely during rehab and add in resistance training at home;Long Term: Improve cardiorespiratory fitness, muscular endurance and strength as measured by increased METs and functional capacity ( )       Able to understand and use rate of perceived exertion (RPE) scale Yes       Intervention Provide education and explanation on how to use RPE scale       Expected Outcomes Short Term: Able to use RPE daily in rehab to express subjective intensity level;Long Term:  Able to use RPE to guide intensity level when exercising independently       Able to understand and use Dyspnea scale Yes       Intervention Provide education and explanation on how to use Dyspnea scale       Expected Outcomes Short Term: Able to use Dyspnea scale daily in rehab to express subjective sense of shortness of breath during exertion;Long Term: Able to use Dyspnea scale to guide intensity level when exercising independently       Knowledge and  understanding of Target Heart Rate Range (THRR) Yes        Intervention Provide education and explanation of THRR including how the numbers were predicted and where they are located for reference       Expected Outcomes Short Term: Able to state/look up THRR;Long Term: Able to use THRR to govern intensity when exercising independently;Short Term: Able to use daily as guideline for intensity in rehab       Understanding of Exercise Prescription Yes       Intervention Provide education, explanation, and written materials on patient's individual exercise prescription       Expected Outcomes Short Term: Able to explain program exercise prescription;Long Term: Able to explain home exercise prescription to exercise independently          Exercise Goals Re-Evaluation :   Discharge Exercise Prescription (Final Exercise Prescription Changes):   Nutrition:  Target Goals: Understanding of nutrition guidelines, daily intake of sodium 1500mg , cholesterol 200mg , calories 30% from fat and 7% or less from saturated fats, daily to have 5 or more servings of fruits and vegetables.  Education: Nutrition 1 -Group instruction provided by verbal, written material, interactive activities, discussions, models, and posters to present general guidelines for heart healthy nutrition including macronutrients, label reading, and promoting whole foods over processed counterparts. Education serves as pensions consultant of discussion of heart healthy eating for all. Written material provided at class time.     Education: Nutrition 2 -Group instruction provided by verbal, written material, interactive activities, discussions, models, and posters to present general guidelines for heart healthy nutrition including sodium, cholesterol, and saturated fat. Providing guidance of habit forming to improve blood pressure, cholesterol, and body weight. Written material provided at class time.     Biometrics:    Nutrition Therapy Plan and Nutrition Goals:   Nutrition  Assessments:  MEDIFICTS Score Key: >=70 Need to make dietary changes  40-70 Heart Healthy Diet <= 40 Therapeutic Level Cholesterol Diet  Flowsheet Row PULMONARY REHAB CHRONIC OBSTRUCTIVE PULMONARY DISEASE from 10/10/2020 in [facility], Vascular, & Lung Health  Picture Your Plate Total Score on Discharge 57   Picture Your Plate Scores: <59 Unhealthy dietary pattern with much room for improvement. 41-50 Dietary pattern unlikely to meet recommendations for good health and room for improvement. 51-60 More healthful dietary pattern, with some room for improvement.  >60 Healthy dietary pattern, although there may be some specific behaviors that could be improved.   Nutrition Goals Re-Evaluation:   Nutrition Goals Discharge (Final Nutrition Goals Re-Evaluation):   Psychosocial: Target Goals: Acknowledge presence or absence of significant depression and/or stress, maximize coping skills, provide positive support system. Participant is able to verbalize types and ability to use techniques and skills needed for reducing stress and depression.   Education: Stress, Anxiety, and Depression - Group verbal and visual presentation to define topics covered.  Reviews how body is impacted by stress, anxiety, and depression.  Also discusses healthy ways to reduce stress and to treat/manage anxiety and depression.  Written material provided at class time.   Education: Sleep Hygiene -Provides group verbal and written instruction about how sleep can affect your health.  Define sleep hygiene, discuss sleep cycles and impact of sleep habits. Review good sleep hygiene tips.    Initial Review & Psychosocial Screening:  Initial Psych Review & Screening - 12/26/23 1407       Initial Review   Current issues with None Identified      Family Dynamics  Good Support System? Yes    Comments roommate [name]      Barriers   Psychosocial barriers to participate in program The  patient should benefit from training in stress management and relaxation.      Screening Interventions   Interventions Encouraged to exercise    Expected Outcomes Short Term goal: Utilizing psychosocial counselor, staff and physician to assist with identification of specific Stressors or current issues interfering with healing process. Setting desired goal for each stressor or current issue identified.;Long Term Goal: Stressors or current issues are controlled or eliminated.;Short Term goal: Identification and review with participant of any Quality of Life or Depression concerns found by scoring the questionnaire.;Long Term goal: The participant improves quality of Life and PHQ9 Scores as seen by post scores and/or verbalization of changes          Quality of Life Scores:  Scores of 19 and below usually indicate a poorer quality of life in these areas.  A difference of  2-3 points is a clinically meaningful difference.  A difference of 2-3 points in the total score of the Quality of Life Index has been associated with significant improvement in overall quality of life, self-image, physical symptoms, and general health in studies assessing change in quality of life.  PHQ-9: Review Flowsheet  More data exists      12/26/2023 12/24/2023 10/29/2023 01/28/2023 03/05/2022  Depression screen PHQ 2/9  Decreased Interest 0 0 0 0 0  Down, Depressed, Hopeless 0 0 0 0 0  PHQ - 2 Score 0 0 0 0 0  Altered sleeping 0 0 0 - -  Tired, decreased energy 1 3 3  - -  Change in appetite 0 0 0 - -  Feeling bad or failure about yourself  0 0 0 - -  Trouble concentrating 0 0 0 - -  Moving slowly or fidgety/restless 0 0 0 - -  Suicidal thoughts 0 0 0 - -  PHQ-9 Score 1 3 3   - -  Difficult doing work/chores Not difficult at all Extremely dIfficult Not difficult at all - -    Details       Data saved with a previous flowsheet row definition        Interpretation of Total Score  Total Score Depression  Severity:  1-4 = Minimal depression, 5-9 = Mild depression, 10-14 = Moderate depression, 15-19 = Moderately severe depression, 20-27 = Severe depression   Psychosocial Evaluation and Intervention:  Psychosocial Evaluation - 12/26/23 1410       Psychosocial Evaluation & Interventions   Interventions Encouraged to exercise with the program and follow exercise prescription    Comments Pt does not show any signs of depression or any psychosocial concerns. He has a positive outlook on life.    Expected Outcomes For pt to participate in PR    Continue Psychosocial Services  No Follow up required          Psychosocial Re-Evaluation:   Psychosocial Discharge (Final Psychosocial Re-Evaluation):   Education: Education Goals: Education classes will be provided on a weekly basis, covering required topics. Participant will state understanding/return demonstration of topics presented.  Learning Barriers/Preferences:  Learning Barriers/Preferences - 12/26/23 1411       Learning Barriers/Preferences   Learning Barriers Sight;Hearing    Learning Preferences Written Material;Audio;Skilled Demonstration          General Pulmonary Education Topics:  Infection Prevention: - Provides verbal and written material to individual with discussion of infection control including proper  hand washing and proper equipment cleaning during exercise session.   Falls Prevention: - Provides verbal and written material to individual with discussion of falls prevention and safety.   Chronic Lung Disease Review: - Group verbal instruction with posters, models, PowerPoint presentations and videos,  to review new updates, new respiratory medications, new advancements in procedures and treatments. Providing information on websites and 800 numbers for continued self-education. Includes information about supplement oxygen , available portable oxygen  systems, continuous and intermittent flow rates, oxygen  safety,  concentrators, and Medicare reimbursement for oxygen . Explanation of Pulmonary Drugs, including class, frequency, complications, importance of spacers, rinsing mouth after steroid MDI's, and proper cleaning methods for nebulizers. Review of basic lung anatomy and physiology related to function, structure, and complications of lung disease. Review of risk factors. Discussion about methods for diagnosing sleep apnea and types of masks and machines for OSA. Includes a review of the use of types of environmental controls: home humidity, furnaces, filters, dust mite/pet prevention, HEPA vacuums. Discussion about weather changes, air quality and the benefits of nasal washing. Instruction on Warning signs, infection symptoms, calling MD promptly, preventive modes, and value of vaccinations. Review of effective airway clearance, coughing and/or vibration techniques. Emphasizing that all should Create an Action Plan. Written material provided at class time.   AED/CPR: - Group verbal and written instruction with the use of models to demonstrate the basic use of the AED with the basic ABC's of resuscitation.    Tests and Procedures:  - Group verbal and visual presentation and models provide information about basic cardiac anatomy and function. Reviews the testing methods done to diagnose heart disease and the outcomes of the test results. Describes the treatment choices: Medical Management, Angioplasty, or Coronary Bypass Surgery for treating various heart conditions including Myocardial Infarction, Angina, Valve Disease, and Cardiac Arrhythmias.  Written material provided at class time.   Medication Safety: - Group verbal and visual instruction to review commonly prescribed medications for heart and lung disease. Reviews the medication, class of the drug, and side effects. Includes the steps to properly store meds and maintain the prescription regimen.  Written material given at graduation.   Other: -Provides  group and verbal instruction on various topics (see comments) Flowsheet Row PULMONARY REHAB CHRONIC OBSTRUCTIVE PULMONARY DISEASE from 10/05/2020 in [facility]  Date 09/21/20  Educator handout  [Label Reading]    Knowledge Questionnaire Score:  Knowledge Questionnaire Score - 12/26/23 1416       Knowledge Questionnaire Score   Pre Score 17/18           Core Components/Risk Factors/Patient Goals at Admission:  Personal Goals and Risk Factors at Admission - 12/26/23 1413       Core Components/Risk Factors/Patient Goals on Admission   Improve shortness of breath with ADL's Yes    Intervention Provide education, individualized exercise plan and daily activity instruction to help decrease symptoms of SOB with activities of daily living.    Expected Outcomes Short Term: Improve cardiorespiratory fitness to achieve a reduction of symptoms when performing ADLs;Long Term: Be able to perform more ADLs without symptoms or delay the onset of symptoms          Education:Diabetes - Individual verbal and written instruction to review signs/symptoms of diabetes, desired ranges of glucose level fasting, after meals and with exercise. Acknowledge that pre and post exercise glucose checks will be done for 3 sessions at entry of program.   Know Your Numbers and  Heart Failure: - Group verbal and visual instruction to discuss disease risk factors for cardiac and pulmonary disease and treatment options.  Reviews associated critical values for Overweight/Obesity, Hypertension, Cholesterol, and Diabetes.  Discusses basics of heart failure: signs/symptoms and treatments.  Introduces Heart Failure Zone chart for action plan for heart failure. Written material provided at class time.   Core Components/Risk Factors/Patient Goals Review:    Core Components/Risk Factors/Patient Goals at Discharge (Final Review):    ITP Comments:   Comments:  Dr. Slater Staff is Medical Director for Pulmonary Rehab at [facility].

## 2023-12-30 ENCOUNTER — Encounter (HOSPITAL_COMMUNITY)

## 2024-01-01 ENCOUNTER — Encounter (HOSPITAL_COMMUNITY)
Admission: RE | Admit: 2024-01-01 | Discharge: 2024-01-01 | Disposition: A | Discharge status: Home / Self Care | Source: Ambulatory Visit | Attending: Pulmonary Disease | Admitting: Pulmonary Disease

## 2024-01-01 DIAGNOSIS — J449 Chronic obstructive pulmonary disease, unspecified: Secondary | ICD-10-CM

## 2024-01-01 LAB — GLUCOSE, CAPILLARY
Glucose-Capillary: 135 mg/dL — ABNORMAL HIGH (ref 70–99)
Glucose-Capillary: 102 mg/dL — ABNORMAL HIGH (ref 70–99)

## 2024-01-01 NOTE — Progress Notes (Signed)
 Daily Session Note  Patient Details  Name: Johnathan [name] MRN: [id number] Date of Birth: 1932-10-20 Referring Provider:   [name] Pulmonary Rehab Walk Test from 12/26/2023 in [facility]  Referring Provider Mannam    Encounter Date: 01/01/2024  Check In:  Session Check In - 01/01/24 1331       Check-In   Supervising physician immediately available to respond to emergencies [facility] MD immediately available    Physician(s) Lum Louis, NP    Location [facility]    Staff Present Ronal Levin, RN, BSN;Randi Midge BS, ACSM-CEP, Exercise Physiologist;Kaylee Nicholaus, MS, ACSM-CEP, Exercise Physiologist;Casey Claudene, RT    Virtual Visit No    Medication changes reported     No    Fall or balance concerns reported    No    Tobacco Cessation No Change    Warm-up and Cool-down Performed as group-led instruction    Resistance Training Performed Yes    VAD Patient? No    PAD/SET Patient? No      Pain Assessment   Currently in Pain? No/denies    Pain Score 0-No pain    Multiple Pain Sites No          Capillary Blood Glucose: Results for orders placed or performed during the hospital encounter of 01/01/24 (from the past 24 hours)  Glucose, capillary     Status: Abnormal   Collection Time: 01/01/24  1:13 PM  Result Value Ref Range   Glucose-Capillary 135 (H) 70 - 99 mg/dL  Glucose, capillary     Status: Abnormal   Collection Time: 01/01/24  2:44 PM  Result Value Ref Range   Glucose-Capillary 102 (H) 70 - 99 mg/dL      Tobacco Use Ypdunmb[8]  Goals Met:  Exercise tolerated well Queuing for purse lip breathing No report of concerns or symptoms today Strength training completed today  Goals Unmet:  Not Applicable  Comments: Service time is from 1315 to 1446    Dr. Slater Staff is Medical Director for Pulmonary Rehab at [facility].     [1]  Social History Tobacco Use  Smoking  Status Former   Current packs/day: 0.00   Average packs/day: 1 pack/day for 45.0 years (45.0 ttl pk-yrs)   Types: Cigarettes   Start date: 01/21/1949   Quit date: 01/21/1994   Years since quitting: 29.9  Smokeless Tobacco Never

## 2024-01-06 ENCOUNTER — Encounter (HOSPITAL_COMMUNITY)

## 2024-01-08 ENCOUNTER — Encounter (HOSPITAL_COMMUNITY): Admission: RE | Admit: 2024-01-08 | Discharge: 2024-01-08 | Attending: Pulmonary Disease

## 2024-01-08 DIAGNOSIS — J449 Chronic obstructive pulmonary disease, unspecified: Secondary | ICD-10-CM

## 2024-01-08 LAB — GLUCOSE, CAPILLARY
Glucose-Capillary: 122 mg/dL — ABNORMAL HIGH (ref 70–99)
Glucose-Capillary: 89 mg/dL (ref 70–99)

## 2024-01-08 NOTE — Progress Notes (Signed)
 Daily Session Note  Patient Details  Name: Johnathan [name] MRN: [id number] Date of Birth: January 21, 1933 Referring Provider:   [name] Pulmonary Rehab Walk Test from 12/26/2023 in [facility]  Referring Provider Mannam    Encounter Date: 01/08/2024  Check In:  Session Check In - 01/08/24 1325       Check-In   Supervising physician immediately available to respond to emergencies [facility] MD immediately available    Physician(s) Barnie Hila, NP    [facility]    Staff Present Ronal Levin, RN, BSN;Randi Midge BS, ACSM-CEP, Exercise Physiologist;Kaylee Nicholaus, MS, ACSM-CEP, Exercise Physiologist;Meshulem Onorato Claudene, RT    Virtual Visit No    Medication changes reported     No    Fall or balance concerns reported    No    Tobacco Cessation No Change    Warm-up and Cool-down Performed as group-led instruction    Resistance Training Performed Yes    VAD Patient? No    PAD/SET Patient? No      Pain Assessment   Currently in Pain? No/denies    Pain Score 0-No pain    Multiple Pain Sites No          Capillary Blood Glucose: Results for orders placed or performed during the hospital encounter of 01/08/24 (from the past 24 hours)  Glucose, capillary     Status: Abnormal   Collection Time: 01/08/24  1:29 PM  Result Value Ref Range   Glucose-Capillary 122 (H) 70 - 99 mg/dL  Glucose, capillary     Status: None   Collection Time: 01/08/24  2:52 PM  Result Value Ref Range   Glucose-Capillary 89 70 - 99 mg/dL      Tobacco Use Ypdunmb[8]  Goals Met:  Proper associated with RPD/PD & O2 Sat Independence with exercise equipment Exercise tolerated well No report of concerns or symptoms today Strength training completed today  Goals Unmet:  Not Applicable  Comments: Service time is from 1322 to 1457.    Dr. Slater Staff is Medical Director for Pulmonary Rehab at [facility].     [1]   Social History Tobacco Use  Smoking Status Former   Current packs/day: 0.00   Average packs/day: 1 pack/day for 45.0 years (45.0 ttl pk-yrs)   Types: Cigarettes   Start date: 01/21/1949   Quit date: 01/21/1994   Years since quitting: 29.9  Smokeless Tobacco Never

## 2024-01-13 ENCOUNTER — Encounter (HOSPITAL_COMMUNITY)
Admission: RE | Admit: 2024-01-13 | Discharge: 2024-01-13 | Disposition: A | Discharge status: Home / Self Care | Source: Ambulatory Visit | Attending: Pulmonary Disease | Admitting: Pulmonary Disease

## 2024-01-13 VITALS — Wt 156.3 lb

## 2024-01-13 DIAGNOSIS — J449 Chronic obstructive pulmonary disease, unspecified: Secondary | ICD-10-CM

## 2024-01-13 LAB — GLUCOSE, CAPILLARY: Glucose-Capillary: 95 mg/dL (ref 70–99)

## 2024-01-13 NOTE — Progress Notes (Signed)
 Incomplete Pulmonary Rehab Session Note  Patient Details  Name: Johnathan [name] MRN: [id number] Date of Birth: Feb 25, 1932 Referring Provider:   [name] Pulmonary Rehab Walk Test from 12/26/2023 in [facility]  Referring Provider Mannam    Johnathan [name] did not complete his rehab session. Johnathan [name] came in for [facility] and his blood sugar was 95. Johnathan [name]'s BS drops by about 30, pt did not eat today. Pt sent home d/t BS and not eating before exercise.

## 2024-01-14 NOTE — Progress Notes (Signed)
 Pulmonary Individual Treatment Plan  Patient Details  Name: Johnathan [name] MRN: [id number] Date of Birth: 1933-01-01 Referring Provider:   Conrad [name] Pulmonary Rehab Walk Test from 12/26/2023 in [facility]  Referring Provider Johnathan [name]    Initial Encounter Date:  Flowsheet Row Pulmonary Rehab Walk Test from 12/26/2023 in [facility]  Date 12/26/23    Visit Diagnosis: Stage 3 severe COPD by GOLD classification (HCC)  Patient's Home Medications on Admission:  Current Medications[1]  Past Medical History: Past Medical History:  Diagnosis Date   Anal fissure    COPD (chronic obstructive pulmonary disease) (HCC)    Diverticulosis    DM type 2 (diabetes mellitus, type 2) (HCC)    GERD (gastroesophageal reflux disease)    uses prilosec    Hypertension    Pneumonia 2018   Raynaud's disease    Shortness of breath dyspnea    WITH EXERTION   Sinusitis    Tremors of nervous system     Tobacco Use: Tobacco Use History[2]  Labs: Review Flowsheet  More data exists      Latest Ref Rng & Units 06/18/2022 11/20/2022 04/30/2023 10/29/2023 12/17/2023  Labs for ITP Cardiac and Pulmonary Rehab  Cholestrol 0 - 200 mg/dL - - - 856  -  LDL (calc) 0 - 99 mg/dL - - - 79  -  HDL-C >60.99 mg/dL - - - 63.69  -  Trlycerides 0.0 - 149.0 mg/dL - - - 859.9  -  Hemoglobin A1c 4.6 - 6.5 % - 6.6  6.4  6.7  -  Bicarbonate 20.0 - 28.0 mmol/L 29.8  - - - 29.2   TCO2 22 - 32 mmol/L 31  - - - -  O2 Saturation % 48  - - - 69     Capillary Blood Glucose: Lab Results  Component Value Date   GLUCAP 95 01/13/2024   GLUCAP 89 01/08/2024   GLUCAP 122 (H) 01/08/2024   GLUCAP 102 (H) 01/01/2024   GLUCAP 135 (H) 01/01/2024     Pulmonary Assessment Scores:  Pulmonary Assessment Scores     Row Name 12/26/23 1414         ADL UCSD   ADL Phase Entry     SOB Score total 62       CAT  Score   CAT Score 23       mMRC Score   mMRC Score 4       UCSD: Self-administered rating of dyspnea associated with activities of daily living (ADLs) 6-point scale (0 = not at all to 5 = maximal or unable to do because of breathlessness)  Scoring Scores range from 0 to 120.  Minimally important difference is 5 units  CAT: CAT can identify the health impairment of COPD patients and is better correlated with disease progression.  CAT has a scoring range of zero to 40. The CAT score is classified into four groups of low (less than 10), medium (10 - 20), high (21-30) and very high (31-40) based on the impact level of disease on health status. A CAT score over 10 suggests significant symptoms.  A worsening CAT score could be explained by an exacerbation, poor medication adherence, poor inhaler technique, or progression of COPD or comorbid conditions.  CAT MCID is 2 points  mMRC: mMRC (Modified Medical Research Council) Dyspnea Scale is used to assess the degree of baseline functional disability in  patients of respiratory disease due to dyspnea. No minimal important difference is established. A decrease in score of 1 point or greater is considered a positive change.   Pulmonary Function Assessment:   Exercise Target Goals: Exercise Program Goal: Individual exercise prescription set using results from initial 6 min walk test and THRR while considering  patients activity barriers and safety.   Exercise Prescription Goal: Initial exercise prescription builds to 30-45 minutes a day of aerobic activity, 2-3 days per week.  Home exercise guidelines will be given to patient during program as part of exercise prescription that the participant will acknowledge.  Activity Barriers & Risk Stratification:  Activity Barriers & Cardiac Risk Stratification - 12/26/23 1417       Activity Barriers & Cardiac Risk Stratification   Activity Barriers Arthritis;Deconditioning;Muscular  Weakness;Shortness of Breath;Balance Concerns;Assistive Device    Cardiac Risk Stratification High          6 Minute Walk:  6 Minute Walk     Row Name 12/26/23 1526         6 Minute Walk   Phase Initial     Distance 910 feet     Walk Time 6 minutes     # of Rest Breaks 0     MPH 1.72     METS 1.54     RPE 13     Perceived Dyspnea  1     VO2 Peak 5.4     Symptoms No     Resting HR 70 bpm     Resting BP 130/54     Resting Oxygen  Saturation  97 %     Exercise Oxygen  Saturation  during 6 min walk 90 %     Max Ex. HR 89 bpm     Max Ex. BP 154/60     2 Minute Post BP 144/60       Interval HR   1 Minute HR 82     2 Minute HR 86     3 Minute HR 85     4 Minute HR 86     5 Minute HR 89     6 Minute HR 81     2 Minute Post HR 71     Interval Heart Rate? Yes       Interval Oxygen    Interval Oxygen ? Yes     Baseline Oxygen  Saturation % 97 %     1 Minute Oxygen  Saturation % 97 %     1 Minute Liters of Oxygen  2 L     2 Minute Oxygen  Saturation % 95 %     2 Minute Liters of Oxygen  2 L     3 Minute Oxygen  Saturation % 95 %     3 Minute Liters of Oxygen  2 L     4 Minute Oxygen  Saturation % 92 %     4 Minute Liters of Oxygen  2 L     5 Minute Oxygen  Saturation % 90 %     5 Minute Liters of Oxygen  2 L     6 Minute Oxygen  Saturation % 91 %     6 Minute Liters of Oxygen  2 L     2 Minute Post Oxygen  Saturation % 95 %     2 Minute Post Liters of Oxygen  2 L        Oxygen  Initial Assessment:  Oxygen  Initial Assessment - 12/26/23 1413       Home Oxygen    Home Oxygen  Device Portable Concentrator;Home Concentrator  Sleep Oxygen  Prescription Continuous    Liters per minute 2    Home Exercise Oxygen  Prescription Continuous    Liters per minute 2    Home Resting Oxygen  Prescription Continuous    Liters per minute 2    Compliance with Home Oxygen  Use Yes      Initial 6 min Walk   Oxygen  Used Continuous    Liters per minute 2      Program Oxygen  Prescription    Program Oxygen  Prescription Continuous    Liters per minute 2    Comments Johnathan [name] is compliant with supplemental oxygen .      Intervention   Short Term Goals To learn and exhibit compliance with exercise, home and travel O2 prescription;To learn and understand importance of monitoring SPO2 with pulse oximeter and demonstrate accurate use of the pulse oximeter.;To learn and understand importance of maintaining oxygen  saturations>88%;To learn and demonstrate proper pursed lip breathing techniques or other breathing techniques. ;To learn and demonstrate proper use of respiratory medications    Long  Term Goals Exhibits compliance with exercise, home  and travel O2 prescription;Verbalizes importance of monitoring SPO2 with pulse oximeter and return demonstration;Maintenance of O2 saturations>88%;Exhibits proper breathing techniques, such as pursed lip breathing or other method taught during program session;Compliance with respiratory medication;Demonstrates proper use of MDIs          Oxygen  Re-Evaluation:  Oxygen  Re-Evaluation     Row Name 01/09/24 1324             Program Oxygen  Prescription   Program Oxygen  Prescription Continuous       Liters per minute 2       Comments Pacer is compliant with supplemental oxygen .         Home Oxygen    Home Oxygen  Device Portable Concentrator;Home Concentrator       Sleep Oxygen  Prescription Continuous       Liters per minute 2       Home Exercise Oxygen  Prescription Continuous       Liters per minute 2       Home Resting Oxygen  Prescription Continuous       Liters per minute 2       Compliance with Home Oxygen  Use Yes         Goals/Expected Outcomes   Short Term Goals To learn and exhibit compliance with exercise, home and travel O2 prescription;To learn and understand importance of monitoring SPO2 with pulse oximeter and demonstrate accurate use of the pulse oximeter.;To learn and understand importance of maintaining oxygen  saturations>88%;To  learn and demonstrate proper pursed lip breathing techniques or other breathing techniques. ;To learn and demonstrate proper use of respiratory medications       Long  Term Goals Exhibits compliance with exercise, home  and travel O2 prescription;Verbalizes importance of monitoring SPO2 with pulse oximeter and return demonstration;Maintenance of O2 saturations>88%;Exhibits proper breathing techniques, such as pursed lip breathing or other method taught during program session;Compliance with respiratory medication;Demonstrates proper use of MDIs       Goals/Expected Outcomes Compliance and understanding of monitoring oxygen  saturation at home and understanding importance of pursed lip breathing.          Oxygen  Discharge (Final Oxygen  Re-Evaluation):  Oxygen  Re-Evaluation - 01/09/24 1324       Program Oxygen  Prescription   Program Oxygen  Prescription Continuous    Liters per minute 2    Comments Joson is compliant with supplemental oxygen .      Home Oxygen    Home Oxygen   Device Portable Concentrator;Home Concentrator    Sleep Oxygen  Prescription Continuous    Liters per minute 2    Home Exercise Oxygen  Prescription Continuous    Liters per minute 2    Home Resting Oxygen  Prescription Continuous    Liters per minute 2    Compliance with Home Oxygen  Use Yes      Goals/Expected Outcomes   Short Term Goals To learn and exhibit compliance with exercise, home and travel O2 prescription;To learn and understand importance of monitoring SPO2 with pulse oximeter and demonstrate accurate use of the pulse oximeter.;To learn and understand importance of maintaining oxygen  saturations>88%;To learn and demonstrate proper pursed lip breathing techniques or other breathing techniques. ;To learn and demonstrate proper use of respiratory medications    Long  Term Goals Exhibits compliance with exercise, home  and travel O2 prescription;Verbalizes importance of monitoring SPO2 with pulse oximeter and return  demonstration;Maintenance of O2 saturations>88%;Exhibits proper breathing techniques, such as pursed lip breathing or other method taught during program session;Compliance with respiratory medication;Demonstrates proper use of MDIs    Goals/Expected Outcomes Compliance and understanding of monitoring oxygen  saturation at home and understanding importance of pursed lip breathing.          Initial Exercise Prescription:  Initial Exercise Prescription - 12/26/23 1500       Date of Initial Exercise RX and Referring Provider   Date 12/26/23    Referring Provider Johnathan [name]    Expected Discharge Date 03/25/24      Oxygen    Oxygen  Continuous    Liters 2    Maintain Oxygen  Saturation 88% or higher      NuStep   Level 2    SPM 60    Minutes 15    METs 1.8      Track   Laps 4    Minutes 15    METs 1.5      Prescription Details   Frequency (times per week) 2    Duration Progress to 30 minutes of continuous aerobic without signs/symptoms of physical distress      Intensity   THRR 40-80% of Max Heartrate 52-105    Ratings of Perceived Exertion 11-13    Perceived Dyspnea 0-4      Progression   Progression Continue to progress workloads to maintain intensity without signs/symptoms of physical distress.      Resistance Training   Training Prescription Yes    Weight --   red bands   Reps 10-15          Perform Capillary Blood Glucose checks as needed.  Exercise Prescription Changes:   Exercise Prescription Changes     Row Name 01/08/24 1506             Response to Exercise   Blood Pressure (Admit) 124/60       Blood Pressure (Exit) 116/80       Heart Rate (Admit) 82 bpm       Heart Rate (Exercise) 85 bpm       Heart Rate (Exit) 79 bpm       Oxygen  Saturation (Admit) 93 %  2L       Oxygen  Saturation (Exercise) 91 %  2L       Oxygen  Saturation (Exit) 98 %  2L       Rating of Perceived Exertion (Exercise) 11       Perceived Dyspnea (Exercise) 1       Duration  Continue with 30 min of aerobic exercise without signs/symptoms  of physical distress.       Intensity THRR unchanged         Resistance Training   Training Prescription Yes       Weight red bands       Reps 10-15       Time 10 Minutes         Oxygen    Oxygen  Continuous       Liters 2         NuStep   Level 1       SPM 68       Minutes 15       METs 1.7         Track   Laps 9       Minutes 15       METs 2.38         Oxygen    Maintain Oxygen  Saturation 88% or higher          Exercise Comments:   Exercise Goals and Review:   Exercise Goals     Row Name 12/26/23 1418             Exercise Goals   Increase Physical Activity Yes       Intervention Provide advice, education, support and counseling about physical activity/exercise needs.;Develop an individualized exercise prescription for aerobic and resistive training based on initial evaluation findings, risk stratification, comorbidities and participant's personal goals.       Expected Outcomes Short Term: Attend rehab on a regular basis to increase amount of physical activity.;Long Term: Add in home exercise to make exercise part of routine and to increase amount of physical activity.;Long Term: Exercising regularly at least 3-5 days a week.       Increase Strength and Stamina Yes       Intervention Provide advice, education, support and counseling about physical activity/exercise needs.;Develop an individualized exercise prescription for aerobic and resistive training based on initial evaluation findings, risk stratification, comorbidities and participant's personal goals.       Expected Outcomes Short Term: Increase workloads from initial exercise prescription for resistance, speed, and METs.;Short Term: Perform resistance training exercises routinely during rehab and add in resistance training at home;Long Term: Improve cardiorespiratory fitness, muscular endurance and strength as measured by increased METs and  functional capacity ( )       Able to understand and use rate of perceived exertion (RPE) scale Yes       Intervention Provide education and explanation on how to use RPE scale       Expected Outcomes Short Term: Able to use RPE daily in rehab to express subjective intensity level;Long Term:  Able to use RPE to guide intensity level when exercising independently       Able to understand and use Dyspnea scale Yes       Intervention Provide education and explanation on how to use Dyspnea scale       Expected Outcomes Short Term: Able to use Dyspnea scale daily in rehab to express subjective sense of shortness of breath during exertion;Long Term: Able to use Dyspnea scale to guide intensity level when exercising independently       Knowledge and understanding of Target Heart Rate Range (THRR) Yes       Intervention Provide education and explanation of THRR including how the numbers were predicted and where they are located for reference       Expected Outcomes Short Term: Able to state/look up THRR;Long Term: Able to use THRR  to govern intensity when exercising independently;Short Term: Able to use daily as guideline for intensity in rehab       Understanding of Exercise Prescription Yes       Intervention Provide education, explanation, and written materials on patient's individual exercise prescription       Expected Outcomes Short Term: Able to explain program exercise prescription;Long Term: Able to explain home exercise prescription to exercise independently          Exercise Goals Re-Evaluation :  Exercise Goals Re-Evaluation     Row Name 01/09/24 1321             Exercise Goal Re-Evaluation   Exercise Goals Review Increase Physical Activity;Able to understand and use Dyspnea scale;Understanding of Exercise Prescription;Increase Strength and Stamina;Knowledge and understanding of Target Heart Rate Range (THRR);Able to understand and use rate of perceived exertion (RPE) scale        Comments Tamari has completed 2 exercise sessions. He is walking the track for 15 min, METs 2.38 the last session. He also exercises on the recumbent stepper for 15 min, level 1, METs 1.7.  He performs the warmup and cooldown standing up with support. He has begun with red bands. Will progress as tolerated.       Expected Outcomes Through exercise at rehab and home, the patient will decrease shortness of breath with daily activities and feel confident in carrying out an exercise regimn at home.          Discharge Exercise Prescription (Final Exercise Prescription Changes):  Exercise Prescription Changes - 01/08/24 1506       Response to Exercise   Blood Pressure (Admit) 124/60    Blood Pressure (Exit) 116/80    Heart Rate (Admit) 82 bpm    Heart Rate (Exercise) 85 bpm    Heart Rate (Exit) 79 bpm    Oxygen  Saturation (Admit) 93 %   2L   Oxygen  Saturation (Exercise) 91 %   2L   Oxygen  Saturation (Exit) 98 %   2L   Rating of Perceived Exertion (Exercise) 11    Perceived Dyspnea (Exercise) 1    Duration Continue with 30 min of aerobic exercise without signs/symptoms of physical distress.    Intensity THRR unchanged      Resistance Training   Training Prescription Yes    Weight red bands    Reps 10-15    Time 10 Minutes      Oxygen    Oxygen  Continuous    Liters 2      NuStep   Level 1    SPM 68    Minutes 15    METs 1.7      Track   Laps 9    Minutes 15    METs 2.38      Oxygen    Maintain Oxygen  Saturation 88% or higher          Nutrition:  Target Goals: Understanding of nutrition guidelines, daily intake of sodium 1500mg , cholesterol 200mg , calories 30% from fat and 7% or less from saturated fats, daily to have 5 or more servings of fruits and vegetables.  Biometrics:    Nutrition Therapy Plan and Nutrition Goals:  Nutrition Therapy & Goals - 01/01/24 1403       Nutrition Therapy   Diet Regular diet    Drug/Food Interactions Statins/Certain Fruits       Personal Nutrition Goals   Nutrition Goal Patient to identify strategies for weight gain of 0.5-2 # per week.  Comments Patient with medical history significant for COPD, DM2, HTN, hyperlipidemia. Appropriate glucose control based on recent A1c 6.7% on 10/29/2023. Pt endorses unintentional wt loss of 5-6 lb over past month; reports current weight of 147 lb. Underweight based on BMI < 23 kg/m2 for >= 65 years. Pt denies change in appetite; diet consists of high calorie/high protein foods such as eggs with grits and bacon, pizza and oyster casserole. Snacks typically consist of candy. Sometimes has banana shake made with milk and Slim Fast for breakfast; reports using Slim Fast for additional nutrients. RD provided suggestions for increasing calories in shakes, including high protein snacks such as peanut butter and crackers. Also provided suggestions for high calorie oral nutrition supplements such as Boost/Ensure which may be added to shake/smoothie recipes. Patient will benefit from participation in pulmonary rehab for nutrition education, exercise, and lifestyle modification.      Intervention Plan   Intervention Prescribe, educate and counsel regarding individualized specific dietary modifications aiming towards targeted core components such as weight, hypertension, lipid management, diabetes, heart failure and other comorbidities.;Nutrition handout(s) given to patient.   Handout: Weight Gain and Weight Maintenance   Expected Outcomes Short Term Goal: Understand basic principles of dietary content, such as calories, fat, sodium, cholesterol and nutrients.;Long Term Goal: Adherence to prescribed nutrition plan.          Nutrition Assessments:  MEDIFICTS Score Key: >=70 Need to make dietary changes  40-70 Heart Healthy Diet <= 40 Therapeutic Level Cholesterol Diet  Flowsheet Row PULMONARY REHAB CHRONIC OBSTRUCTIVE PULMONARY DISEASE from 10/10/2020 in [facility], & [facility]  Picture Your Plate Total Score on Discharge 57   Picture Your Plate Scores: <59 Unhealthy dietary pattern with much room for improvement. 41-50 Dietary pattern unlikely to meet recommendations for good health and room for improvement. 51-60 More healthful dietary pattern, with some room for improvement.  >60 Healthy dietary pattern, although there may be some specific behaviors that could be improved.    Nutrition Goals Re-Evaluation:   Nutrition Goals Discharge (Final Nutrition Goals Re-Evaluation):   Psychosocial: Target Goals: Acknowledge presence or absence of significant depression and/or stress, maximize coping skills, provide positive support system. Participant is able to verbalize types and ability to use techniques and skills needed for reducing stress and depression.  Initial Review & Psychosocial Screening:  Initial Psych Review & Screening - 12/26/23 1407       Initial Review   Current issues with None Identified      Family Dynamics   Good Support System? Yes    Comments roommate [name]      Barriers   Psychosocial barriers to participate in program The patient should benefit from training in stress management and relaxation.      Screening Interventions   Interventions Encouraged to exercise    Expected Outcomes Short Term goal: Utilizing psychosocial counselor, staff and physician to assist with identification of specific Stressors or current issues interfering with healing process. Setting desired goal for each stressor or current issue identified.;Long Term Goal: Stressors or current issues are controlled or eliminated.;Short Term goal: Identification and review with participant of any Quality of Life or Depression concerns found by scoring the questionnaire.;Long Term goal: The participant improves quality of Life and PHQ9 Scores as seen by post scores and/or verbalization of changes          Quality of Life Scores:  Scores of 19  and below usually indicate a poorer quality of  life in these areas.  A difference of  2-3 points is a clinically meaningful difference.  A difference of 2-3 points in the total score of the Quality of Life Index has been associated with significant improvement in overall quality of life, self-image, physical symptoms, and general health in studies assessing change in quality of life.  PHQ-9: Review Flowsheet  More data exists      12/26/2023 12/24/2023 10/29/2023 01/28/2023 03/05/2022  Depression screen PHQ 2/9  Decreased Interest 0 0 0 0 0  Down, Depressed, Hopeless 0 0 0 0 0  PHQ - 2 Score 0 0 0 0 0  Altered sleeping 0 0 0 - -  Tired, decreased energy 1 3 3  - -  Change in appetite 0 0 0 - -  Feeling bad or failure about yourself  0 0 0 - -  Trouble concentrating 0 0 0 - -  Moving slowly or fidgety/restless 0 0 0 - -  Suicidal thoughts 0 0 0 - -  PHQ-9 Score 1 3 3   - -  Difficult doing work/chores Not difficult at all Extremely dIfficult Not difficult at all - -    Details       Data saved with a previous flowsheet row definition        Interpretation of Total Score  Total Score Depression Severity:  1-4 = Minimal depression, 5-9 = Mild depression, 10-14 = Moderate depression, 15-19 = Moderately severe depression, 20-27 = Severe depression   Psychosocial Evaluation and Intervention:  Psychosocial Evaluation - 12/26/23 1410       Psychosocial Evaluation & Interventions   Interventions Encouraged to exercise with the program and follow exercise prescription    Comments Pt does not show any signs of depression or any psychosocial concerns. He has a positive outlook on life.    Expected Outcomes For pt to participate in PR    Continue Psychosocial Services  No Follow up required          Psychosocial Re-Evaluation:  Psychosocial Re-Evaluation     Row Name 01/05/24 1538             Psychosocial Re-Evaluation   Current issues with None Identified       Comments Monthly  psy/soc re-eval is as follows: No psychosocial concerns at this time for [facility].  He seems to enjoy the social aspect of exercising in pulmonary rehab.  He is very positive and enjoys the interaction with the group class. He denies any needs at this time.       Expected Outcomes For Butler to continue to attend PR without any psy/soc barriers or concerns       Interventions Encouraged to attend Pulmonary Rehabilitation for the exercise       Continue Psychosocial Services  Follow up required by staff          Psychosocial Discharge (Final Psychosocial Re-Evaluation):  Psychosocial Re-Evaluation - 01/05/24 1538       Psychosocial Re-Evaluation   Current issues with None Identified    Comments Monthly psy/soc re-eval is as follows: No psychosocial concerns at this time for [facility].  He seems to enjoy the social aspect of exercising in pulmonary rehab.  He is very positive and enjoys the interaction with the group class. He denies any needs at this time.    Expected Outcomes For Yeiren to continue to attend PR without any psy/soc barriers or concerns    Interventions Encouraged to attend Pulmonary Rehabilitation for the exercise  Continue Psychosocial Services  Follow up required by staff          Education: Education Goals: Education classes will be provided on a weekly basis, covering required topics. Participant will state understanding/return demonstration of topics presented.  Learning Barriers/Preferences:  Learning Barriers/Preferences - 12/26/23 1411       Learning Barriers/Preferences   Learning Barriers Sight;Hearing    Learning Preferences Written Material;Audio;Skilled Demonstration          Education Topics: Know Your Numbers Group instruction that is supported by a PowerPoint presentation. Instructor discusses importance of knowing and understanding resting, exercise, and post-exercise oxygen  saturation, heart rate, and blood pressure. Oxygen  saturation, heart rate, blood  pressure, rating of perceived exertion, and dyspnea are reviewed along with a normal range for these values.  Flowsheet Row PULMONARY REHAB CHRONIC OBSTRUCTIVE PULMONARY DISEASE from 01/08/2024 in [facility] for Heart, Vascular, & Lung Health  Date 01/08/24  Educator EP  Instruction Review Code 1- Verbalizes Understanding    Exercise for the Pulmonary Patient Group instruction that is supported by a PowerPoint presentation. Instructor discusses benefits of exercise, core components of exercise, frequency, duration, and intensity of an exercise routine, importance of utilizing pulse oximetry during exercise, safety while exercising, and options of places to exercise outside of rehab.  Flowsheet Row PULMONARY REHAB CHRONIC OBSTRUCTIVE PULMONARY DISEASE from 01/01/2024 in [facility] for Heart, Vascular, & Lung Health  Date 01/01/24  Educator EP  Instruction Review Code 1- Verbalizes Understanding    MET Level  Group instruction provided by PowerPoint, verbal discussion, and written material to support subject matter. Instructor reviews what METs are and how to increase METs.    Pulmonary Medications Verbally interactive group education provided by instructor with focus on inhaled medications and proper administration. Flowsheet Row PULMONARY REHAB CHRONIC OBSTRUCTIVE PULMONARY DISEASE from 10/05/2020 in [facility] for Heart, Vascular, & Lung Health  Date 08/24/20  Educator Handout    Anatomy and Physiology of the Respiratory System Group instruction provided by PowerPoint, verbal discussion, and written material to support subject matter. Instructor reviews respiratory cycle and anatomical components of the respiratory system and their functions. Instructor also reviews differences in obstructive and restrictive respiratory diseases with examples of each.  Flowsheet Row PULMONARY REHAB CHRONIC OBSTRUCTIVE PULMONARY  DISEASE from 10/05/2020 in [facility] for Heart, Vascular, & Lung Health  Date 08/31/20  Educator Handout  [Beat a sedentary lifestyle]    Oxygen  Safety Group instruction provided by PowerPoint, verbal discussion, and written material to support subject matter. There is an overview of What is Oxygen  and Why do we need it.  Instructor also reviews how to create a safe environment for oxygen  use, the importance of using oxygen  as prescribed, and the risks of noncompliance. There is a brief discussion on traveling with oxygen  and resources the patient may utilize.   Oxygen  Use Group instruction provided by PowerPoint, verbal discussion, and written material to discuss how supplemental oxygen  is prescribed and different types of oxygen  supply systems. Resources for more information are provided.    Breathing Techniques Group instruction that is supported by demonstration and informational handouts. Instructor discusses the benefits of pursed lip and diaphragmatic breathing and detailed demonstration on how to perform both.     Risk Factor Reduction Group instruction that is supported by a PowerPoint presentation. Instructor discusses the definition of a risk factor, different risk factors for pulmonary disease, and how the heart and  lungs work together. Flowsheet Row PULMONARY REHAB CHRONIC OBSTRUCTIVE PULMONARY DISEASE from 10/05/2020 in [facility] for Heart, Vascular, & Lung Health  Date 09/28/20  Educator handout    Pulmonary Diseases Group instruction provided by PowerPoint, verbal discussion, and written material to support subject matter. Instructor gives an overview of the different type of pulmonary diseases. There is also a discussion on risk factors and symptoms as well as ways to manage the diseases.   Stress and Energy Conservation Group instruction provided by PowerPoint, verbal discussion, and written material to support  subject matter. Instructor gives an overview of stress and the impact it can have on the body. Instructor also reviews ways to reduce stress. There is also a discussion on energy conservation and ways to conserve energy throughout the day.   Warning Signs and Symptoms Group instruction provided by PowerPoint, verbal discussion, and written material to support subject matter. Instructor reviews warning signs and symptoms of stroke, heart attack, cold and flu. Instructor also reviews ways to prevent the spread of infection.   Other Education Group or individual verbal, written, or video instructions that support the educational goals of the pulmonary rehab program. Flowsheet Row PULMONARY REHAB CHRONIC OBSTRUCTIVE PULMONARY DISEASE from 10/05/2020 in [facility] for Heart, Vascular, & Lung Health  Date 09/21/20  Educator handout  [Label Reading]     Knowledge Questionnaire Score:  Knowledge Questionnaire Score - 12/26/23 1416       Knowledge Questionnaire Score   Pre Score 17/18          Core Components/Risk Factors/Patient Goals at Admission:  Personal Goals and Risk Factors at Admission - 12/26/23 1413       Core Components/Risk Factors/Patient Goals on Admission   Improve shortness of breath with ADL's Yes    Intervention Provide education, individualized exercise plan and daily activity instruction to help decrease symptoms of SOB with activities of daily living.    Expected Outcomes Short Term: Improve cardiorespiratory fitness to achieve a reduction of symptoms when performing ADLs;Long Term: Be able to perform more ADLs without symptoms or delay the onset of symptoms          Core Components/Risk Factors/Patient Goals Review:   Goals and Risk Factor Review     Row Name 01/05/24 1540             Core Components/Risk Factors/Patient Goals Review   Personal Goals Review Improve shortness of breath with ADL's;Develop more efficient breathing  techniques such as purse lipped breathing and diaphragmatic breathing and practicing self-pacing with activity.       Review Monthly review of patient's Core Components/Risk Factors/Patient Goals are as follows:  Goal progressing for improving his shortness of breath with ADLs. Akaash is working to build up his strength and stamina. He currently wears 2L of O2 to exercise and can maintain his O2 saturation. Goal progressing for developing more efficient breathing techniques such as purse lipped breathing and diaphragmatic breathing; and practicing self-pacing with activity. Creed can perform purse lipped breathing while short of breath. He demonstrated this while performing the warmup and exercising. He can initiate PLB on his own. He is working on diaphragmatic breathing at home but still needs assistance. We will continue to work on these breathing exercises with him. Jabin will continue to benefit from the PR program for exercise and education.       Expected Outcomes Pt will show progress toward meeting expected goals and outcomes.  Core Components/Risk Factors/Patient Goals at Discharge (Final Review):   Goals and Risk Factor Review - 01/05/24 1540       Core Components/Risk Factors/Patient Goals Review   Personal Goals Review Improve shortness of breath with ADL's;Develop more efficient breathing techniques such as purse lipped breathing and diaphragmatic breathing and practicing self-pacing with activity.    Review Monthly review of patient's Core Components/Risk Factors/Patient Goals are as follows:  Goal progressing for improving his shortness of breath with ADLs. Raven is working to build up his strength and stamina. He currently wears 2L of O2 to exercise and can maintain his O2 saturation. Goal progressing for developing more efficient breathing techniques such as purse lipped breathing and diaphragmatic breathing; and practicing self-pacing with activity. Mayer can perform purse lipped  breathing while short of breath. He demonstrated this while performing the warmup and exercising. He can initiate PLB on his own. He is working on diaphragmatic breathing at home but still needs assistance. We will continue to work on these breathing exercises with him. Javi will continue to benefit from the PR program for exercise and education.    Expected Outcomes Pt will show progress toward meeting expected goals and outcomes.          ITP Comments:   Comments: Pt is making expected progress toward Pulmonary Rehab goals after completing 3 session(s). Recommend continued exercise, life style modification, education, and utilization of breathing techniques to increase stamina and strength, while also decreasing shortness of breath with exertion.  Dr. Slater Staff is Medical Director for Pulmonary Rehab at [facility].       [1]  Current Outpatient Medications:    amLODipine  (NORVASC ) 5 MG tablet, Take 1 tablet (5 mg total) by mouth daily., Disp: 90 tablet, Rfl: 3   aspirin  EC 81 MG tablet, Take 81 mg by mouth every morning., Disp: , Rfl:    B Complex-C (SUPER B COMPLEX PO), Take 1 tablet by mouth at bedtime., Disp: , Rfl:    benzonatate  (TESSALON ) 100 MG capsule, Take 1 capsule (100 mg total) by mouth 2 (two) times daily as needed for cough., Disp: 20 capsule, Rfl: 0   Flaxseed, Linseed, (FLAXSEED OIL) 1200 MG CAPS, Take 1,200 mg by mouth at bedtime., Disp: , Rfl:    Fluticasone -Umeclidin-Vilant (TRELEGY ELLIPTA ) 100-62.5-25 MCG/ACT AEPB, Inhale 1 puff into the lungs daily. Rinse mouth after use, Disp: 180 each, Rfl: 1   Ginkgo Biloba EXTR, Take 1 capsule by mouth 2 (two) times daily., Disp: , Rfl:    guaiFENesin  (MUCINEX ) 600 MG 12 hr tablet, Take 1 tablet (600 mg total) by mouth 2 (two) times daily as needed for cough., Disp: 30 tablet, Rfl: 0   Investigational - Study Medication, Inject 450 mg into the skin See admin instructions. STUDY [id number] 450 mg or placebo SQ  pre-filled syringe 450 mg, Disp: , Rfl:    ketoconazole  (NIZORAL ) 2 % cream, Apply 1 Application topically daily. Use as needed for fungal infection (Patient not taking: Reported on 12/26/2023), Disp: 15 g, Rfl: 1   ketoconazole  (NIZORAL ) 2 % shampoo, Apply 1 Application topically 2 (two) times a week. Use as needed for itchy scalp (Patient not taking: Reported on 12/26/2023), Disp: 120 mL, Rfl: PRN   loratadine  (CLARITIN ) 10 MG tablet, Take 10 mg by mouth in the morning., Disp: , Rfl:    meloxicam  (MOBIC ) 15 MG tablet, TAKE 1 TABLET BY MOUTH IN THE MORNING, Disp: 90 tablet, Rfl: 0   metFORMIN  (GLUCOPHAGE ) 500  MG tablet, Take 0.5 tablets (250 mg total) by mouth daily., Disp: 45 tablet, Rfl: 3   Misc Natural Products (HIMALAYAN GOJI PO), Take by mouth See admin instructions. Goji berries - Eat a handful of berries by mouth once a day, Disp: , Rfl:    Multiple Vitamins-Minerals (ONE-A-DAY MENS 50+) TABS, Take 1 tablet by mouth daily with breakfast., Disp: , Rfl:    MYRBETRIQ  50 MG TB24 tablet, Take 1 tablet (50 mg total) by mouth daily., Disp: 90 tablet, Rfl: 0   OHTUVAYRE  3 MG/2.5ML SUSP, Inhale 3 mg into the lungs 2 (two) times daily., Disp: , Rfl:    omeprazole  (PRILOSEC) 20 MG capsule, Take 1 capsule (20 mg total) by mouth 2 (two) times daily., Disp: 180 capsule, Rfl: 3   OXYGEN , Inhale 2 L/min into the lungs continuous., Disp: , Rfl:    pravastatin  (PRAVACHOL ) 40 MG tablet, Take 0.5 tablets (20 mg total) by mouth daily., Disp: 45 tablet, Rfl: 1   primidone  (MYSOLINE ) 50 MG tablet, Take 1 tablet (50 mg total) by mouth 2 (two) times daily., Disp: 180 tablet, Rfl: 1   SYSTANE COMPLETE PF 0.6 % SOLN, Place 1 drop into both eyes daily., Disp: , Rfl:    tadalafil  (CIALIS ) 20 MG tablet, TAKE 1/2 TO 1 (ONE-HALF TO ONE) TABLET BY MOUTH AS NEEDED 2 HOURS PRIOR TO SEXUAL ACTIVITY, Disp: 15 tablet, Rfl: 0   terazosin  (HYTRIN ) 1 MG capsule, Take 1 capsule by mouth twice daily, Disp: 180 capsule, Rfl: 0    triamcinolone  cream (KENALOG ) 0.1 %, Apply 1 Application topically 2 (two) times daily as needed (rash/itching)., Disp: , Rfl:    TYLENOL  500 MG tablet, Take 500 mg by mouth every 6 (six) hours as needed for mild pain (pain score 1-3)., Disp: , Rfl:    VENTOLIN  HFA 108 (90 Base) MCG/ACT inhaler, Inhale 2 puffs into the lungs every 6 (six) hours as needed for wheezing or shortness of breath., Disp: 18 g, Rfl: 5   VITAMIN E PO, Take 1 capsule by mouth daily with breakfast., Disp: , Rfl:  [2]  Social History Tobacco Use  Smoking Status Former   Current packs/day: 0.00   Average packs/day: 1 pack/day for 45.0 years (45.0 ttl pk-yrs)   Types: Cigarettes   Start date: 01/21/1949   Quit date: 01/21/1994   Years since quitting: 30.0  Smokeless Tobacco Never

## 2024-01-20 ENCOUNTER — Encounter (HOSPITAL_COMMUNITY): Admission: RE | Admit: 2024-01-20 | Source: Ambulatory Visit

## 2024-01-21 ENCOUNTER — Telehealth (HOSPITAL_COMMUNITY): Payer: Self-pay | Admitting: *Deleted

## 2024-01-21 NOTE — Telephone Encounter (Signed)
 Notified pt that we will not have PR class 1/1. He confirmed. He sts he was sorry he missed class yesterday and does st he has a stuffy nose. Reminded pt to be well before coming to class and that he can call us  if he is sick.  Aliene Aris BS, ACSM-CEP 01/21/2024 11:05 AM

## 2024-01-27 ENCOUNTER — Encounter (HOSPITAL_COMMUNITY): Admission: RE | Admit: 2024-01-27 | Source: Ambulatory Visit

## 2024-01-29 ENCOUNTER — Encounter (HOSPITAL_COMMUNITY)

## 2024-02-03 ENCOUNTER — Encounter (HOSPITAL_COMMUNITY)
Admission: RE | Admit: 2024-02-03 | Discharge: 2024-02-03 | Disposition: A | Discharge status: Home / Self Care | Source: Ambulatory Visit | Attending: Pulmonary Disease | Admitting: Pulmonary Disease

## 2024-02-03 DIAGNOSIS — J449 Chronic obstructive pulmonary disease, unspecified: Secondary | ICD-10-CM | POA: Insufficient documentation

## 2024-02-03 LAB — GLUCOSE, CAPILLARY
Glucose-Capillary: 97 mg/dL (ref 70–99)
Glucose-Capillary: 94 mg/dL (ref 70–99)

## 2024-02-03 NOTE — Progress Notes (Signed)
 Daily Session Note  Patient Details  Name: Johnathan [name] MRN: [id number] Date of Birth: 27-Dec-1932 Referring Provider:   [name] Pulmonary Rehab Walk Test from 12/26/2023 in [facility] for Heart, Vascular, & Lung Health  Referring Provider Mannam    Encounter Date: 02/03/2024  Check In:  Session Check In - 02/03/24 1334       Check-In   Supervising physician immediately available to respond to emergencies [facility] MD immediately available    Physician(s) Rosaline Skains, NP    Location [facility]    Staff Present Ronal Levin, RN, BSN;Randi Midge BS, ACSM-CEP, Exercise Physiologist;Symon Norwood Claudene Neita Moats, MS, ACSM-CEP, Exercise Physiologist    Virtual Visit No    Medication changes reported     No    Fall or balance concerns reported    No    Tobacco Cessation No Change    Warm-up and Cool-down Performed as group-led instruction    Resistance Training Performed Yes    VAD Patient? No    PAD/SET Patient? No      Pain Assessment   Currently in Pain? No/denies    Multiple Pain Sites No          Capillary Blood Glucose: Results for orders placed or performed during the hospital encounter of 02/03/24 (from the past 24 hours)  Glucose, capillary     Status: None   Collection Time: 02/03/24  1:28 PM  Result Value Ref Range   Glucose-Capillary 97 70 - 99 mg/dL  Glucose, capillary     Status: None   Collection Time: 02/03/24  2:37 PM  Result Value Ref Range   Glucose-Capillary 94 70 - 99 mg/dL      Tobacco Use Ypdunmb[8]  Goals Met:  Proper associated with RPD/PD & O2 Sat Independence with exercise equipment Exercise tolerated well No report of concerns or symptoms today Strength training completed today  Goals Unmet:  Not Applicable  Comments: Service time is from 1325 to 1444.    Dr. Slater Staff is Medical Director for Pulmonary Rehab at [facility]     [1]  Social History Tobacco Use  Smoking  Status Former   Current packs/day: 0.00   Average packs/day: 1 pack/day for 45.0 years (45.0 ttl pk-yrs)   Types: Cigarettes   Start date: 01/21/1949   Quit date: 01/21/1994   Years since quitting: 30.0  Smokeless Tobacco Never

## 2024-02-05 ENCOUNTER — Encounter (HOSPITAL_COMMUNITY)
Admission: RE | Admit: 2024-02-05 | Discharge: 2024-02-05 | Disposition: A | Discharge status: Home / Self Care | Source: Ambulatory Visit | Attending: Pulmonary Disease | Admitting: Pulmonary Disease

## 2024-02-05 DIAGNOSIS — J449 Chronic obstructive pulmonary disease, unspecified: Secondary | ICD-10-CM

## 2024-02-05 LAB — GLUCOSE, CAPILLARY
Glucose-Capillary: 115 mg/dL — ABNORMAL HIGH (ref 70–99)
Glucose-Capillary: 100 mg/dL — ABNORMAL HIGH (ref 70–99)

## 2024-02-05 NOTE — Progress Notes (Signed)
 Daily Session Note  Patient Details  Name: Johnathan [name] MRN: [id number] Date of Birth: [birth year] Referring Provider:   [name] Pulmonary Rehab Walk Test from 12/26/2023 in [facility] for Heart, Vascular, & Lung Health  Referring Provider Mannam    Encounter Date: 02/05/2024  Check In:  Session Check In - 02/05/24 1331       Check-In   Supervising physician immediately available to respond to emergencies [facility] MD immediately available    Physician(s) Lum Louis, NP    Location [facility]   Simultaneous filing. User may not have seen previous data.   Staff Present Ronal Levin, RN, BSN;Randi Midge BS, ACSM-CEP, Exercise Physiologist;Casey Claudene Neita Moats, MS, ACSM-CEP, Exercise Physiologist    Virtual Visit No    Medication changes reported     No    Fall or balance concerns reported    No    Tobacco Cessation No Change    Warm-up and Cool-down Performed as group-led instruction    Resistance Training Performed Yes    VAD Patient? No    PAD/SET Patient? No      Pain Assessment   Currently in Pain? No/denies    Pain Score 0-No pain    Multiple Pain Sites No          Capillary Blood Glucose: Results for orders placed or performed during the hospital encounter of 02/05/24 (from the past 24 hours)  Glucose, capillary     Status: Abnormal   Collection Time: 02/05/24  1:26 PM  Result Value Ref Range   Glucose-Capillary 115 (H) 70 - 99 mg/dL  Glucose, capillary     Status: Abnormal   Collection Time: 02/05/24  2:35 PM  Result Value Ref Range   Glucose-Capillary 100 (H) 70 - 99 mg/dL      Tobacco Use Ypdunmb[8]  Goals Met:  Exercise tolerated well Queuing for purse lip breathing No report of concerns or symptoms today Strength training completed today  Goals Unmet:  Not Applicable  Comments: Service time is from 1329 to 1438    Dr. Slater Staff is Medical Director for Pulmonary Rehab at [facility].     [1]  Social History Tobacco Use  Smoking Status Former   Current packs/day: 0.00   Average packs/day: 1 pack/day for 45.0 years (45.0 ttl pk-yrs)   Types: Cigarettes   Start date: 01/21/1949   Quit date: 01/21/1994   Years since quitting: 30.0  Smokeless Tobacco Never

## 2024-02-10 ENCOUNTER — Encounter (HOSPITAL_COMMUNITY)
Admission: RE | Admit: 2024-02-10 | Discharge: 2024-02-10 | Disposition: A | Discharge status: Home / Self Care | Source: Ambulatory Visit | Attending: Pulmonary Disease | Admitting: Pulmonary Disease

## 2024-02-10 VITALS — Wt 156.5 lb

## 2024-02-10 DIAGNOSIS — J449 Chronic obstructive pulmonary disease, unspecified: Secondary | ICD-10-CM

## 2024-02-10 LAB — GLUCOSE, CAPILLARY
Glucose-Capillary: 99 mg/dL (ref 70–99)
Glucose-Capillary: 84 mg/dL (ref 70–99)

## 2024-02-10 NOTE — Progress Notes (Signed)
 Daily Session Note  Patient Details  Name: Johnathan [name] MRN: [id number] Date of Birth: 27-Sep-1932 Referring Provider:   [name] Pulmonary Rehab Walk Test from 12/26/2023 in [facility] for Heart, Vascular, & Lung Health  Referring Provider Mannam    Encounter Date: 02/10/2024  Check In:  Session Check In - 02/10/24 1322       Check-In   Supervising physician immediately available to respond to emergencies [facility] MD immediately available    Physician(s) Lum Louis, NP    [facility]    Staff Present Ronal Levin, RN, BSN;Randi Midge BS, ACSM-CEP, Exercise Physiologist;Charnel Giles Claudene Neita Moats, MS, ACSM-CEP, Exercise Physiologist    Virtual Visit No    Medication changes reported     No    Fall or balance concerns reported    No    Tobacco Cessation No Change    Warm-up and Cool-down Performed as group-led instruction    Resistance Training Performed Yes    VAD Patient? No    PAD/SET Patient? No      Pain Assessment   Currently in Pain? No/denies    Pain Score 0-No pain    Multiple Pain Sites No          Capillary Blood Glucose: Results for orders placed or performed during the hospital encounter of 02/10/24 (from the past 24 hours)  Glucose, capillary     Status: None   Collection Time: 02/10/24  1:23 PM  Result Value Ref Range   Glucose-Capillary 99 70 - 99 mg/dL  Glucose, capillary     Status: None   Collection Time: 02/10/24  2:21 PM  Result Value Ref Range   Glucose-Capillary 84 70 - 99 mg/dL     Exercise Prescription Changes - 02/10/24 1500       Response to Exercise   Blood Pressure (Admit) 138/70    Blood Pressure (Exercise) 122/60    Blood Pressure (Exit) 140/60    Heart Rate (Admit) 75 bpm    Heart Rate (Exercise) 84 bpm    Heart Rate (Exit) 75 bpm    Oxygen  Saturation (Admit) 92 %    Oxygen  Saturation (Exercise) 91 %    Oxygen  Saturation (Exit) 97 %    Rating of Perceived Exertion  (Exercise) 12    Perceived Dyspnea (Exercise) 1    Duration Continue with 30 min of aerobic exercise without signs/symptoms of physical distress.    Intensity THRR unchanged      Progression   Progression Continue to progress workloads to maintain intensity without signs/symptoms of physical distress.      Resistance Training   Weight red bands    Reps 10-15    Time 10 Minutes      Oxygen    Oxygen  Continuous    Liters 2      NuStep   Level 2    SPM 70    Minutes 15    METs 1.9      Track   Laps 7    Minutes 15    METs 2.08      Oxygen    Maintain Oxygen  Saturation 88% or higher          Tobacco Use History[1]  Goals Met:  Proper associated with RPD/PD & O2 Sat Independence with exercise equipment Exercise tolerated well No report of concerns or symptoms today Strength training completed today  Goals Unmet:  Not Applicable  Comments: Service time is from 1320 to 1429.  Dr. Slater Staff is Medical Director for Pulmonary Rehab at [facility].     [1]  Social History Tobacco Use  Smoking Status Former   Current packs/day: 0.00   Average packs/day: 1 pack/day for 45.0 years (45.0 ttl pk-yrs)   Types: Cigarettes   Start date: 01/21/1949   Quit date: 01/21/1994   Years since quitting: 30.0  Smokeless Tobacco Never

## 2024-02-11 NOTE — Progress Notes (Signed)
 Pulmonary Individual Treatment Plan  Patient Details  Name: Johnathan [name] MRN: [id number] Date of Birth: 04-Jun-1932 Referring Provider:   [name] Pulmonary Rehab Walk Test from 12/26/2023 in [name]  Referring Provider Mannam    Initial Encounter Date:  Flowsheet Row Pulmonary Rehab Walk Test from 12/26/2023 in [name]  Date 12/26/23    Visit Diagnosis: Stage 3 severe COPD by GOLD classification (HCC)  Patient's Home Medications on Admission:  Current Medications[1]  Past Medical History: Past Medical History:  Diagnosis Date   Anal fissure    COPD (chronic obstructive pulmonary disease) (HCC)    Diverticulosis    DM type 2 (diabetes mellitus, type 2) (HCC)    GERD (gastroesophageal reflux disease)    uses prilosec    Hypertension    Pneumonia 2018   Raynaud's disease    Shortness of breath dyspnea    WITH EXERTION   Sinusitis    Tremors of nervous system     Tobacco Use: Tobacco Use History[2]  Labs: Review Flowsheet  More data exists      Latest Ref Rng & Units 06/18/2022 11/20/2022 04/30/2023 10/29/2023 12/17/2023  Labs for ITP Cardiac and Pulmonary Rehab  Cholestrol 0 - 200 mg/dL - - - 856  -  LDL (calc) 0 - 99 mg/dL - - - 79  -  HDL-C >60.99 mg/dL - - - 63.69  -  Trlycerides 0.0 - 149.0 mg/dL - - - 859.9  -  Hemoglobin A1c 4.6 - 6.5 % - 6.6  6.4  6.7  -  Bicarbonate 20.0 - 28.0 mmol/L 29.8  - - - 29.2   TCO2 22 - 32 mmol/L 31  - - - -  O2 Saturation % 48  - - - 69     Capillary Blood Glucose: Lab Results  Component Value Date   GLUCAP 84 02/10/2024   GLUCAP 99 02/10/2024   GLUCAP 100 (H) 02/05/2024   GLUCAP 115 (H) 02/05/2024   GLUCAP 94 02/03/2024     Pulmonary Assessment Scores:  Pulmonary Assessment Scores     Row Name 12/26/23 1414         ADL UCSD   ADL Phase Entry     SOB Score total 62       CAT Score    CAT Score 23       mMRC Score   mMRC Score 4       UCSD: Self-administered rating of dyspnea associated with activities of daily living (ADLs) 6-point scale (0 = not at all to 5 = maximal or unable to do because of breathlessness)  Scoring Scores range from 0 to 120.  Minimally important difference is 5 units  CAT: CAT can identify the health impairment of COPD patients and is better correlated with disease progression.  CAT has a scoring range of zero to 40. The CAT score is classified into four groups of low (less than 10), medium (10 - 20), high (21-30) and very high (31-40) based on the impact level of disease on health status. A CAT score over 10 suggests significant symptoms.  A worsening CAT score could be explained by an exacerbation, poor medication adherence, poor inhaler technique, or progression of COPD or comorbid conditions.  CAT MCID is 2 points  mMRC: mMRC (Modified Medical Research Council) Dyspnea Scale is used to assess the degree of baseline functional disability in patients  of respiratory disease due to dyspnea. No minimal important difference is established. A decrease in score of 1 point or greater is considered a positive change.   Pulmonary Function Assessment:   Exercise Target Goals: Exercise Program Goal: Individual exercise prescription set using results from initial 6 min walk test and THRR while considering  patients activity barriers and safety.   Exercise Prescription Goal: Initial exercise prescription builds to 30-45 minutes a day of aerobic activity, 2-3 days per week.  Home exercise guidelines will be given to patient during program as part of exercise prescription that the participant will acknowledge.  Activity Barriers & Risk Stratification:  Activity Barriers & Cardiac Risk Stratification - 12/26/23 1417       Activity Barriers & Cardiac Risk Stratification   Activity Barriers Arthritis;Deconditioning;Muscular Weakness;Shortness of  Breath;Balance Concerns;Assistive Device    Cardiac Risk Stratification High          6 Minute Walk:  6 Minute Walk     Row Name 12/26/23 1526         6 Minute Walk   Phase Initial     Distance 910 feet     Walk Time 6 minutes     # of Rest Breaks 0     MPH 1.72     METS 1.54     RPE 13     Perceived Dyspnea  1     VO2 Peak 5.4     Symptoms No     Resting HR 70 bpm     Resting BP 130/54     Resting Oxygen  Saturation  97 %     Exercise Oxygen  Saturation  during 6 min walk 90 %     Max Ex. HR 89 bpm     Max Ex. BP 154/60     2 Minute Post BP 144/60       Interval HR   1 Minute HR 82     2 Minute HR 86     3 Minute HR 85     4 Minute HR 86     5 Minute HR 89     6 Minute HR 81     2 Minute Post HR 71     Interval Heart Rate? Yes       Interval Oxygen    Interval Oxygen ? Yes     Baseline Oxygen  Saturation % 97 %     1 Minute Oxygen  Saturation % 97 %     1 Minute Liters of Oxygen  2 L     2 Minute Oxygen  Saturation % 95 %     2 Minute Liters of Oxygen  2 L     3 Minute Oxygen  Saturation % 95 %     3 Minute Liters of Oxygen  2 L     4 Minute Oxygen  Saturation % 92 %     4 Minute Liters of Oxygen  2 L     5 Minute Oxygen  Saturation % 90 %     5 Minute Liters of Oxygen  2 L     6 Minute Oxygen  Saturation % 91 %     6 Minute Liters of Oxygen  2 L     2 Minute Post Oxygen  Saturation % 95 %     2 Minute Post Liters of Oxygen  2 L        Oxygen  Initial Assessment:  Oxygen  Initial Assessment - 12/26/23 1413       Home Oxygen    Home Oxygen  Device Portable Concentrator;Home Concentrator  Sleep Oxygen  Prescription Continuous    Liters per minute 2    Home Exercise Oxygen  Prescription Continuous    Liters per minute 2    Home Resting Oxygen  Prescription Continuous    Liters per minute 2    Compliance with Home Oxygen  Use Yes      Initial 6 min Walk   Oxygen  Used Continuous    Liters per minute 2      Program Oxygen  Prescription   Program Oxygen  Prescription  Continuous    Liters per minute 2    Comments Johnathan [name] is compliant with supplemental oxygen .      Intervention   Short Term Goals To learn and exhibit compliance with exercise, home and travel O2 prescription;To learn and understand importance of monitoring SPO2 with pulse oximeter and demonstrate accurate use of the pulse oximeter.;To learn and understand importance of maintaining oxygen  saturations>88%;To learn and demonstrate proper pursed lip breathing techniques or other breathing techniques. ;To learn and demonstrate proper use of respiratory medications    Long  Term Goals Exhibits compliance with exercise, home  and travel O2 prescription;Verbalizes importance of monitoring SPO2 with pulse oximeter and return demonstration;Maintenance of O2 saturations>88%;Exhibits proper breathing techniques, such as pursed lip breathing or other method taught during program session;Compliance with respiratory medication;Demonstrates proper use of MDIs          Oxygen  Re-Evaluation:  Oxygen  Re-Evaluation     Row Name 01/09/24 1324 02/06/24 0756           Program Oxygen  Prescription   Program Oxygen  Prescription Continuous Continuous      Liters per minute 2 2      Comments Johnathan [name] is compliant with supplemental oxygen . Johnathan [name] is compliant with supplemental oxygen .        Home Oxygen    Home Oxygen  Device Portable Concentrator;Home Concentrator Portable Concentrator;Home Concentrator      Sleep Oxygen  Prescription Continuous Continuous      Liters per minute 2 2      Home Exercise Oxygen  Prescription Continuous Continuous      Liters per minute 2 2      Home Resting Oxygen  Prescription Continuous Continuous      Liters per minute 2 2      Compliance with Home Oxygen  Use Yes Yes        Goals/Expected Outcomes   Short Term Goals To learn and exhibit compliance with exercise, home and travel O2 prescription;To learn and understand importance of monitoring SPO2 with pulse oximeter and demonstrate  accurate use of the pulse oximeter.;To learn and understand importance of maintaining oxygen  saturations>88%;To learn and demonstrate proper pursed lip breathing techniques or other breathing techniques. ;To learn and demonstrate proper use of respiratory medications To learn and exhibit compliance with exercise, home and travel O2 prescription;To learn and understand importance of monitoring SPO2 with pulse oximeter and demonstrate accurate use of the pulse oximeter.;To learn and understand importance of maintaining oxygen  saturations>88%;To learn and demonstrate proper pursed lip breathing techniques or other breathing techniques. ;To learn and demonstrate proper use of respiratory medications      Long  Term Goals Exhibits compliance with exercise, home  and travel O2 prescription;Verbalizes importance of monitoring SPO2 with pulse oximeter and return demonstration;Maintenance of O2 saturations>88%;Exhibits proper breathing techniques, such as pursed lip breathing or other method taught during program session;Compliance with respiratory medication;Demonstrates proper use of MDIs Exhibits compliance with exercise, home  and travel O2 prescription;Verbalizes importance of monitoring SPO2 with pulse oximeter and return demonstration;Maintenance of O2  saturations>88%;Exhibits proper breathing techniques, such as pursed lip breathing or other method taught during program session;Compliance with respiratory medication;Demonstrates proper use of MDIs      Goals/Expected Outcomes Compliance and understanding of monitoring oxygen  saturation at home and understanding importance of pursed lip breathing. Compliance and understanding of monitoring oxygen  saturation at home and understanding importance of pursed lip breathing.         Oxygen  Discharge (Final Oxygen  Re-Evaluation):  Oxygen  Re-Evaluation - 02/06/24 0756       Program Oxygen  Prescription   Program Oxygen  Prescription Continuous    Liters per minute  2    Comments Johnathan [name] is compliant with supplemental oxygen .      Home Oxygen    Home Oxygen  Device Portable Concentrator;Home Concentrator    Sleep Oxygen  Prescription Continuous    Liters per minute 2    Home Exercise Oxygen  Prescription Continuous    Liters per minute 2    Home Resting Oxygen  Prescription Continuous    Liters per minute 2    Compliance with Home Oxygen  Use Yes      Goals/Expected Outcomes   Short Term Goals To learn and exhibit compliance with exercise, home and travel O2 prescription;To learn and understand importance of monitoring SPO2 with pulse oximeter and demonstrate accurate use of the pulse oximeter.;To learn and understand importance of maintaining oxygen  saturations>88%;To learn and demonstrate proper pursed lip breathing techniques or other breathing techniques. ;To learn and demonstrate proper use of respiratory medications    Long  Term Goals Exhibits compliance with exercise, home  and travel O2 prescription;Verbalizes importance of monitoring SPO2 with pulse oximeter and return demonstration;Maintenance of O2 saturations>88%;Exhibits proper breathing techniques, such as pursed lip breathing or other method taught during program session;Compliance with respiratory medication;Demonstrates proper use of MDIs    Goals/Expected Outcomes Compliance and understanding of monitoring oxygen  saturation at home and understanding importance of pursed lip breathing.          Initial Exercise Prescription:  Initial Exercise Prescription - 12/26/23 1500       Date of Initial Exercise RX and Referring Provider   Date 12/26/23    Referring Provider Mannam    Expected Discharge Date 03/25/24      Oxygen    Oxygen  Continuous    Liters 2    Maintain Oxygen  Saturation 88% or higher      NuStep   Level 2    SPM 60    Minutes 15    METs 1.8      Track   Laps 4    Minutes 15    METs 1.5      Prescription Details   Frequency (times per week) 2    Duration  Progress to 30 minutes of continuous aerobic without signs/symptoms of physical distress      Intensity   THRR 40-80% of Max Heartrate 52-105    Ratings of Perceived Exertion 11-13    Perceived Dyspnea 0-4      Progression   Progression Continue to progress workloads to maintain intensity without signs/symptoms of physical distress.      Resistance Training   Training Prescription Yes    Weight --   red bands   Reps 10-15          Perform Capillary Blood Glucose checks as needed.  Exercise Prescription Changes:   Exercise Prescription Changes     Row Name 01/08/24 1506 02/10/24 1500           Response to Exercise  Blood Pressure (Admit) 124/60 138/70      Blood Pressure (Exercise) -- 122/60      Blood Pressure (Exit) 116/80 140/60      Heart Rate (Admit) 82 bpm 75 bpm      Heart Rate (Exercise) 85 bpm 84 bpm      Heart Rate (Exit) 79 bpm 75 bpm      Oxygen  Saturation (Admit) 93 %  2L 92 %      Oxygen  Saturation (Exercise) 91 %  2L 91 %      Oxygen  Saturation (Exit) 98 %  2L 97 %      Rating of Perceived Exertion (Exercise) 11 12      Perceived Dyspnea (Exercise) 1 1      Duration Continue with 30 min of aerobic exercise without signs/symptoms of physical distress. Continue with 30 min of aerobic exercise without signs/symptoms of physical distress.      Intensity THRR unchanged THRR unchanged        Progression   Progression -- Continue to progress workloads to maintain intensity without signs/symptoms of physical distress.        Resistance Training   Training Prescription Yes --      Weight red bands red bands      Reps 10-15 10-15      Time 10 Minutes 10 Minutes        Oxygen    Oxygen  Continuous Continuous      Liters 2 2        NuStep   Level 1 2      SPM 68 70      Minutes 15 15      METs 1.7 1.9        Track   Laps 9 7      Minutes 15 15      METs 2.38 2.08        Oxygen    Maintain Oxygen  Saturation 88% or higher 88% or higher          Exercise Comments:   Exercise Goals and Review:   Exercise Goals     Row Name 12/26/23 1418             Exercise Goals   Increase Physical Activity Yes       Intervention Provide advice, education, support and counseling about physical activity/exercise needs.;Develop an individualized exercise prescription for aerobic and resistive training based on initial evaluation findings, risk stratification, comorbidities and participant's personal goals.       Expected Outcomes Short Term: Attend rehab on a regular basis to increase amount of physical activity.;Long Term: Add in home exercise to make exercise part of routine and to increase amount of physical activity.;Long Term: Exercising regularly at least 3-5 days a week.       Increase Strength and Stamina Yes       Intervention Provide advice, education, support and counseling about physical activity/exercise needs.;Develop an individualized exercise prescription for aerobic and resistive training based on initial evaluation findings, risk stratification, comorbidities and participant's personal goals.       Expected Outcomes Short Term: Increase workloads from initial exercise prescription for resistance, speed, and METs.;Short Term: Perform resistance training exercises routinely during rehab and add in resistance training at home;Long Term: Improve cardiorespiratory fitness, muscular endurance and strength as measured by increased METs and functional capacity ( )       Able to understand and use rate of perceived exertion (RPE) scale Yes  Intervention Provide education and explanation on how to use RPE scale       Expected Outcomes Short Term: Able to use RPE daily in rehab to express subjective intensity level;Long Term:  Able to use RPE to guide intensity level when exercising independently       Able to understand and use Dyspnea scale Yes       Intervention Provide education and explanation on how to use Dyspnea scale        Expected Outcomes Short Term: Able to use Dyspnea scale daily in rehab to express subjective sense of shortness of breath during exertion;Long Term: Able to use Dyspnea scale to guide intensity level when exercising independently       Knowledge and understanding of Target Heart Rate Range (THRR) Yes       Intervention Provide education and explanation of THRR including how the numbers were predicted and where they are located for reference       Expected Outcomes Short Term: Able to state/look up THRR;Long Term: Able to use THRR to govern intensity when exercising independently;Short Term: Able to use daily as guideline for intensity in rehab       Understanding of Exercise Prescription Yes       Intervention Provide education, explanation, and written materials on patient's individual exercise prescription       Expected Outcomes Short Term: Able to explain program exercise prescription;Long Term: Able to explain home exercise prescription to exercise independently          Exercise Goals Re-Evaluation :  Exercise Goals Re-Evaluation     Row Name 01/09/24 1321 02/06/24 0750           Exercise Goal Re-Evaluation   Exercise Goals Review Increase Physical Activity;Able to understand and use Dyspnea scale;Understanding of Exercise Prescription;Increase Strength and Stamina;Knowledge and understanding of Target Heart Rate Range (THRR);Able to understand and use rate of perceived exertion (RPE) scale Increase Physical Activity;Able to understand and use Dyspnea scale;Understanding of Exercise Prescription;Increase Strength and Stamina;Knowledge and understanding of Target Heart Rate Range (THRR);Able to understand and use rate of perceived exertion (RPE) scale      Comments Johnathan [name] has completed 2 exercise sessions. He is walking the track for 15 min, METs 2.38 the last session. He also exercises on the recumbent stepper for 15 min, level 1, METs 1.7.  He performs the warmup and cooldown standing up  with support. He has begun with red bands. Will progress as tolerated. Add has completed 4 exercise sessions. He has missed several sessions due to tardiness. He is walking the track for 15 min with the [employer],  METs 2.1. He also exercises on the recumbent stepper for 15 min, level 2, METs 1.7.  He performs the warmup and cooldown standing up with support. He is using red bands, 3.7 lbs. Will progress as tolerated.      Expected Outcomes Through exercise at rehab and home, the patient will decrease shortness of breath with daily activities and feel confident in carrying out an exercise regimn at home. Through exercise at rehab and home, the patient will decrease shortness of breath with daily activities and feel confident in carrying out an exercise regimn at home.         Discharge Exercise Prescription (Final Exercise Prescription Changes):  Exercise Prescription Changes - 02/10/24 1500       Response to Exercise   Blood Pressure (Admit) 138/70    Blood Pressure (Exercise) 122/60    Blood Pressure (Exit)  140/60    Heart Rate (Admit) 75 bpm    Heart Rate (Exercise) 84 bpm    Heart Rate (Exit) 75 bpm    Oxygen  Saturation (Admit) 92 %    Oxygen  Saturation (Exercise) 91 %    Oxygen  Saturation (Exit) 97 %    Rating of Perceived Exertion (Exercise) 12    Perceived Dyspnea (Exercise) 1    Duration Continue with 30 min of aerobic exercise without signs/symptoms of physical distress.    Intensity THRR unchanged      Progression   Progression Continue to progress workloads to maintain intensity without signs/symptoms of physical distress.      Resistance Training   Weight red bands    Reps 10-15    Time 10 Minutes      Oxygen    Oxygen  Continuous    Liters 2      NuStep   Level 2    SPM 70    Minutes 15    METs 1.9      Track   Laps 7    Minutes 15    METs 2.08      Oxygen    Maintain Oxygen  Saturation 88% or higher          Nutrition:  Target Goals: Understanding of  nutrition guidelines, daily intake of sodium 1500mg , cholesterol 200mg , calories 30% from fat and 7% or less from saturated fats, daily to have 5 or more servings of fruits and vegetables.  Biometrics:    Nutrition Therapy Plan and Nutrition Goals:  Nutrition Therapy & Goals - 01/01/24 1403       Nutrition Therapy   Diet Regular diet    Drug/Food Interactions Statins/Certain Fruits      Personal Nutrition Goals   Nutrition Goal Patient to identify strategies for weight gain of 0.5-2 # per week.    Comments Patient with medical history significant for COPD, DM2, HTN, hyperlipidemia. Appropriate glucose control based on recent A1c 6.7% on 10/29/2023. Pt endorses unintentional wt loss of 5-6 lb over past month; reports current weight of 147 lb. Underweight based on BMI < 23 kg/m2 for >= 65 years. Pt denies change in appetite; diet consists of high calorie/high protein foods such as eggs with grits and bacon, pizza and oyster casserole. Snacks typically consist of candy. Sometimes has banana shake made with milk and Slim Fast for breakfast; reports using Slim Fast for additional nutrients. RD provided suggestions for increasing calories in shakes, including high protein snacks such as peanut butter and crackers. Also provided suggestions for high calorie oral nutrition supplements such as Boost/Ensure which may be added to shake/smoothie recipes. Patient will benefit from participation in pulmonary rehab for nutrition education, exercise, and lifestyle modification.      Intervention Plan   Intervention Prescribe, educate and counsel regarding individualized specific dietary modifications aiming towards targeted core components such as weight, hypertension, lipid management, diabetes, heart failure and other comorbidities.;Nutrition handout(s) given to patient.   Handout: Weight Gain and Weight Maintenance   Expected Outcomes Short Term Goal: Understand basic principles of dietary content, such as  calories, fat, sodium, cholesterol and nutrients.;Long Term Goal: Adherence to prescribed nutrition plan.          Nutrition Assessments:  MEDIFICTS Score Key: >=70 Need to make dietary changes  40-70 Heart Healthy Diet <= 40 Therapeutic Level Cholesterol Diet  Flowsheet Row PULMONARY REHAB CHRONIC OBSTRUCTIVE PULMONARY DISEASE from 10/10/2020 in [name]  Picture  Your Plate Total Score on Discharge 57   Picture Your Plate Scores: <59 Unhealthy dietary pattern with much room for improvement. 41-50 Dietary pattern unlikely to meet recommendations for good health and room for improvement. 51-60 More healthful dietary pattern, with some room for improvement.  >60 Healthy dietary pattern, although there may be some specific behaviors that could be improved.    Nutrition Goals Re-Evaluation:  Nutrition Goals Re-Evaluation     Row Name 01/29/24 1348             Goals   Current Weight 156 lb 4.8 oz (70.9 kg)  01/13/2024       Nutrition Goal Patient to identify strategies for weight gain of 0.5-2 # per week.       Comment Pt with 0.9 lb wt gain since starting pulmonary rehab program.       Expected Outcome Goals in action. Patient with medical history significant for COPD, DM2, HTN, hyperlipidemia. Pt last attended pulmonary rehab on 01/13/2024; unable to complete session due to pre-exercise blood glucose of 95. Typical diet consists of calorically dense foods. Non-significant wt gain noted since starting rehab; current BMI within desirable range of 23-27 kg/m2 for >= 65 years. Patient will benefit from participation in pulmonary rehab for nutrition education, exercise, and lifestyle modification.          Nutrition Goals Discharge (Final Nutrition Goals Re-Evaluation):  Nutrition Goals Re-Evaluation - 01/29/24 1348       Goals   Current Weight 156 lb 4.8 oz (70.9 kg)   01/13/2024   Nutrition Goal Patient to identify  strategies for weight gain of 0.5-2 # per week.    Comment Pt with 0.9 lb wt gain since starting pulmonary rehab program.    Expected Outcome Goals in action. Patient with medical history significant for COPD, DM2, HTN, hyperlipidemia. Pt last attended pulmonary rehab on 01/13/2024; unable to complete session due to pre-exercise blood glucose of 95. Typical diet consists of calorically dense foods. Non-significant wt gain noted since starting rehab; current BMI within desirable range of 23-27 kg/m2 for >= 65 years. Patient will benefit from participation in pulmonary rehab for nutrition education, exercise, and lifestyle modification.          Psychosocial: Target Goals: Acknowledge presence or absence of significant depression and/or stress, maximize coping skills, provide positive support system. Participant is able to verbalize types and ability to use techniques and skills needed for reducing stress and depression.  Initial Review & Psychosocial Screening:  Initial Psych Review & Screening - 12/26/23 1407       Initial Review   Current issues with None Identified      Family Dynamics   Good Support System? Yes    Comments roommate [name]      Barriers   Psychosocial barriers to participate in program The patient should benefit from training in stress management and relaxation.      Screening Interventions   Interventions Encouraged to exercise    Expected Outcomes Short Term goal: Utilizing psychosocial counselor, staff and physician to assist with identification of specific Stressors or current issues interfering with healing process. Setting desired goal for each stressor or current issue identified.;Long Term Goal: Stressors or current issues are controlled or eliminated.;Short Term goal: Identification and review with participant of any Quality of Life or Depression concerns found by scoring the questionnaire.;Long Term goal: The participant improves quality of Life and PHQ9 Scores  as seen by post scores and/or verbalization of changes  Quality of Life Scores:  Scores of 19 and below usually indicate a poorer quality of life in these areas.  A difference of  2-3 points is a clinically meaningful difference.  A difference of 2-3 points in the total score of the Quality of Life Index has been associated with significant improvement in overall quality of life, self-image, physical symptoms, and general health in studies assessing change in quality of life.  PHQ-9: Review Flowsheet  More data exists      12/26/2023 12/24/2023 10/29/2023 01/28/2023 03/05/2022  Depression screen PHQ 2/9  Decreased Interest 0 0 0 0 0  Down, Depressed, Hopeless 0 0 0 0 0  PHQ - 2 Score 0 0 0 0 0  Altered sleeping 0 0 0 - -  Tired, decreased energy 1 3 3  - -  Change in appetite 0 0 0 - -  Feeling bad or failure about yourself  0 0 0 - -  Trouble concentrating 0 0 0 - -  Moving slowly or fidgety/restless 0 0 0 - -  Suicidal thoughts 0 0 0 - -  PHQ-9 Score 1 3 3   - -  Difficult doing work/chores Not difficult at all Extremely dIfficult Not difficult at all - -    Details       Data saved with a previous flowsheet row definition        Interpretation of Total Score  Total Score Depression Severity:  1-4 = Minimal depression, 5-9 = Mild depression, 10-14 = Moderate depression, 15-19 = Moderately severe depression, 20-27 = Severe depression   Psychosocial Evaluation and Intervention:  Psychosocial Evaluation - 12/26/23 1410       Psychosocial Evaluation & Interventions   Interventions Encouraged to exercise with the program and follow exercise prescription    Comments Pt does not show any signs of depression or any psychosocial concerns. He has a positive outlook on life.    Expected Outcomes For pt to participate in PR    Continue Psychosocial Services  No Follow up required          Psychosocial Re-Evaluation:  Psychosocial Re-Evaluation     Row Name 01/05/24  1538 02/09/24 1520           Psychosocial Re-Evaluation   Current issues with None Identified None Identified      Comments Monthly psy/soc re-eval is as follows: No psychosocial concerns at this time for Johnathan [name].  He seems to enjoy the social aspect of exercising in pulmonary rehab.  He is very positive and enjoys the interaction with the group class. He denies any needs at this time. Monthly psy/soc re-eval is as follows: No psychosocial concerns at this time for Johnathan [name]. He enjoys the social aspect of exercising in pulmonary rehab.  He is very positive and denies any needs at this time.      Expected Outcomes For Johnathan [name] to continue to attend PR without any psy/soc barriers or concerns For Johnathan [name] to continue to attend PR without any psy/soc barriers or concerns      Interventions Encouraged to attend Pulmonary Rehabilitation for the exercise Encouraged to attend Pulmonary Rehabilitation for the exercise      Continue Psychosocial Services  Follow up required by staff Follow up required by staff         Psychosocial Discharge (Final Psychosocial Re-Evaluation):  Psychosocial Re-Evaluation - 02/09/24 1520       Psychosocial Re-Evaluation   Current issues with None Identified    Comments Monthly psy/soc re-eval is  as follows: No psychosocial concerns at this time for Johnathan [name]. He enjoys the social aspect of exercising in pulmonary rehab.  He is very positive and denies any needs at this time.    Expected Outcomes For Dajohn to continue to attend PR without any psy/soc barriers or concerns    Interventions Encouraged to attend Pulmonary Rehabilitation for the exercise    Continue Psychosocial Services  Follow up required by staff          Education: Education Goals: Education classes will be provided on a weekly basis, covering required topics. Participant will state understanding/return demonstration of topics presented.  Learning Barriers/Preferences:  Learning Barriers/Preferences - 12/26/23 1411        Learning Barriers/Preferences   Learning Barriers Sight;Hearing    Learning Preferences Written Material;Audio;Skilled Demonstration          Education Topics: Know Your Numbers Group instruction that is supported by a PowerPoint presentation. Instructor discusses importance of knowing and understanding resting, exercise, and post-exercise oxygen  saturation, heart rate, and blood pressure. Oxygen  saturation, heart rate, blood pressure, rating of perceived exertion, and dyspnea are reviewed along with a normal range for these values.  Flowsheet Row PULMONARY REHAB CHRONIC OBSTRUCTIVE PULMONARY DISEASE from 01/08/2024 in [name]  Date 01/08/24  Educator EP  Instruction Review Code 1- Verbalizes Understanding    Exercise for the Pulmonary Patient Group instruction that is supported by a PowerPoint presentation. Instructor discusses benefits of exercise, core components of exercise, frequency, duration, and intensity of an exercise routine, importance of utilizing pulse oximetry during exercise, safety while exercising, and options of places to exercise outside of rehab.  Flowsheet Row PULMONARY REHAB CHRONIC OBSTRUCTIVE PULMONARY DISEASE from 01/01/2024 in [name]  Date 01/01/24  Educator EP  Instruction Review Code 1- Verbalizes Understanding    MET Level  Group instruction provided by PowerPoint, verbal discussion, and written material to support subject matter. Instructor reviews what METs are and how to increase METs.    Pulmonary Medications Verbally interactive group education provided by instructor with focus on inhaled medications and proper administration. Flowsheet Row PULMONARY REHAB CHRONIC OBSTRUCTIVE PULMONARY DISEASE from 10/05/2020 in [facility]  Date 08/24/20  Educator Handout     Anatomy and Physiology of the Respiratory System Group instruction provided by PowerPoint, verbal discussion, and written material to support subject matter. Instructor reviews respiratory cycle and anatomical components of the respiratory system and their functions. Instructor also reviews differences in obstructive and restrictive respiratory diseases with examples of each.  Flowsheet Row PULMONARY REHAB CHRONIC OBSTRUCTIVE PULMONARY DISEASE from 10/05/2020 in [name]  Date 08/31/20  Educator Handout  [Beat a sedentary lifestyle]    Oxygen  Safety Group instruction provided by PowerPoint, verbal discussion, and written material to support subject matter. There is an overview of What is Oxygen  and Why do we need it.  Instructor also reviews how to create a safe environment for oxygen  use, the importance of using oxygen  as prescribed, and the risks of noncompliance. There is a brief discussion on traveling with oxygen  and resources the patient may utilize.   Oxygen  Use Group instruction provided by PowerPoint, verbal discussion, and written material to discuss how supplemental oxygen  is prescribed and different types of oxygen  supply systems. Resources for more information are provided.  Flowsheet Row  PULMONARY REHAB CHRONIC OBSTRUCTIVE PULMONARY DISEASE from 02/05/2024 in [name]  Date 02/05/24  Educator RT.  Instruction Review Code 1- Verbalizes Understanding    Breathing Techniques Group instruction that is supported by demonstration and informational handouts. Instructor discusses the benefits of pursed lip and diaphragmatic breathing and detailed demonstration on how to perform both.     Risk Factor Reduction Group instruction that is supported by a PowerPoint presentation. Instructor discusses the definition of a risk factor, different risk factors for  pulmonary disease, and how the heart and lungs work together. Flowsheet Row PULMONARY REHAB CHRONIC OBSTRUCTIVE PULMONARY DISEASE from 10/05/2020 in [name]  Date 09/28/20  Educator handout    Pulmonary Diseases Group instruction provided by PowerPoint, verbal discussion, and written material to support subject matter. Instructor gives an overview of the different type of pulmonary diseases. There is also a discussion on risk factors and symptoms as well as ways to manage the diseases.   Stress and Energy Conservation Group instruction provided by PowerPoint, verbal discussion, and written material to support subject matter. Instructor gives an overview of stress and the impact it can have on the body. Instructor also reviews ways to reduce stress. There is also a discussion on energy conservation and ways to conserve energy throughout the day.   Warning Signs and Symptoms Group instruction provided by PowerPoint, verbal discussion, and written material to support subject matter. Instructor reviews warning signs and symptoms of stroke, heart attack, cold and flu. Instructor also reviews ways to prevent the spread of infection.   Other Education Group or individual verbal, written, or video instructions that support the educational goals of the pulmonary rehab program. Flowsheet Row PULMONARY REHAB CHRONIC OBSTRUCTIVE PULMONARY DISEASE from 10/05/2020 in [name]  Date 09/21/20  Educator handout  [Label Reading]     Knowledge Questionnaire Score:  Knowledge Questionnaire Score - 12/26/23 1416       Knowledge Questionnaire Score   Pre Score 17/18          Core Components/Risk Factors/Patient Goals at Admission:  Personal Goals and Risk Factors at Admission - 12/26/23 1413       Core Components/Risk Factors/Patient Goals on Admission   Improve shortness of  breath with ADL's Yes    Intervention Provide education, individualized exercise plan and daily activity instruction to help decrease symptoms of SOB with activities of daily living.    Expected Outcomes Short Term: Improve cardiorespiratory fitness to achieve a reduction of symptoms when performing ADLs;Long Term: Be able to perform more ADLs without symptoms or delay the onset of symptoms          Core Components/Risk Factors/Patient Goals Review:   Goals and Risk Factor Review     Row Name 01/05/24 1540 02/09/24 1521           Core Components/Risk Factors/Patient Goals Review   Personal Goals Review Improve shortness of breath with ADL's;Develop more efficient breathing techniques such as purse lipped breathing and diaphragmatic breathing and practicing self-pacing with activity. Improve shortness of breath with ADL's;Develop more efficient breathing techniques such as purse lipped breathing and diaphragmatic breathing and practicing self-pacing with activity.      Review Monthly review of patient's Core Components/Risk Factors/Patient Goals are as follows:  Goal progressing for improving his shortness of breath with ADLs. Johnathan [name] is working to build up  his strength and stamina. He currently wears 2L of O2 to exercise and can maintain his O2 saturation. Goal progressing for developing more efficient breathing techniques such as purse lipped breathing and diaphragmatic breathing; and practicing self-pacing with activity. Johnathan [name] can perform purse lipped breathing while short of breath. He demonstrated this while performing the warmup and exercising. He can initiate PLB on his own. He is working on diaphragmatic breathing at home but still needs assistance. We will continue to work on these breathing exercises with him. Johnathan [name] will continue to benefit from the PR program for exercise and education. Monthly review of patient's Core Components/Risk Factors/Patient Goals are as follows: Goal progressing for  improving his shortness of breath with ADLs. Demetrio is working to build up his strength and stamina. He currently wears 2L of O2 to exercise and can maintain his O2 saturation. Goal met for developing more efficient breathing techniques such as purse lipped breathing and diaphragmatic breathing; and practicing self-pacing with activity. Hoke can perform purse lipped breathing while short of breath. He demonstrated this while performing the warmup and exercising. He can initiate PLB on his own. He is working on diaphragmatic breathing at home. We will continue to work on these breathing exercises with him. Kendall will continue to benefit from the PR program for exercise and education.      Expected Outcomes Pt will show progress toward meeting expected goals and outcomes. Pt will show progress toward meeting expected goals and outcomes.         Core Components/Risk Factors/Patient Goals at Discharge (Final Review):   Goals and Risk Factor Review - 02/09/24 1521       Core Components/Risk Factors/Patient Goals Review   Personal Goals Review Improve shortness of breath with ADL's;Develop more efficient breathing techniques such as purse lipped breathing and diaphragmatic breathing and practicing self-pacing with activity.    Review Monthly review of patient's Core Components/Risk Factors/Patient Goals are as follows: Goal progressing for improving his shortness of breath with ADLs. Johnathan [name] is working to build up his strength and stamina. He currently wears 2L of O2 to exercise and can maintain his O2 saturation. Goal met for developing more efficient breathing techniques such as purse lipped breathing and diaphragmatic breathing; and practicing self-pacing with activity. Johnathan [name] can perform purse lipped breathing while short of breath. He demonstrated this while performing the warmup and exercising. He can initiate PLB on his own. He is working on diaphragmatic breathing at home. We will continue to work on these  breathing exercises with him. Johnathan [name] will continue to benefit from the PR program for exercise and education.    Expected Outcomes Pt will show progress toward meeting expected goals and outcomes.          ITP Comments:   Comments: Pt is making expected progress toward Pulmonary Rehab goals after completing 6 session(s). Recommend continued exercise, life style modification, education, and utilization of breathing techniques to increase stamina and strength, while also decreasing shortness of breath with exertion.  Dr. Slater Staff is Medical Director for Pulmonary Rehab at [name].       [1]  Current Outpatient Medications:    amLODipine  (NORVASC ) 5 MG tablet, Take 1 tablet (5 mg total) by mouth daily., Disp: 90 tablet, Rfl: 3   aspirin  EC 81 MG tablet, Take 81 mg by mouth every morning., Disp: , Rfl:    B Complex-C (SUPER B COMPLEX PO), Take 1 tablet by mouth at bedtime., Disp: , Rfl:  benzonatate  (TESSALON ) 100 MG capsule, Take 1 capsule (100 mg total) by mouth 2 (two) times daily as needed for cough., Disp: 20 capsule, Rfl: 0   Flaxseed, Linseed, (FLAXSEED OIL) 1200 MG CAPS, Take 1,200 mg by mouth at bedtime., Disp: , Rfl:    Fluticasone -Umeclidin-Vilant (TRELEGY ELLIPTA ) 100-62.5-25 MCG/ACT AEPB, Inhale 1 puff into the lungs daily. Rinse mouth after use, Disp: 180 each, Rfl: 1   Ginkgo Biloba EXTR, Take 1 capsule by mouth 2 (two) times daily., Disp: , Rfl:    guaiFENesin  (MUCINEX ) 600 MG 12 hr tablet, Take 1 tablet (600 mg total) by mouth 2 (two) times daily as needed for cough., Disp: 30 tablet, Rfl: 0   Investigational - Study Medication, Inject 450 mg into the skin See admin instructions. STUDY [id number] 450 mg or placebo SQ pre-filled syringe 450 mg, Disp: , Rfl:    ketoconazole  (NIZORAL ) 2 % cream, Apply 1 Application topically daily. Use as needed for fungal infection (Patient not taking: Reported on 12/26/2023), Disp: 15 g, Rfl: 1   ketoconazole  (NIZORAL )  2 % shampoo, Apply 1 Application topically 2 (two) times a week. Use as needed for itchy scalp (Patient not taking: Reported on 12/26/2023), Disp: 120 mL, Rfl: PRN   loratadine  (CLARITIN ) 10 MG tablet, Take 10 mg by mouth in the morning., Disp: , Rfl:    meloxicam  (MOBIC ) 15 MG tablet, TAKE 1 TABLET BY MOUTH IN THE MORNING, Disp: 90 tablet, Rfl: 0   metFORMIN  (GLUCOPHAGE ) 500 MG tablet, Take 0.5 tablets (250 mg total) by mouth daily., Disp: 45 tablet, Rfl: 3   Misc Natural Products (HIMALAYAN GOJI PO), Take by mouth See admin instructions. Goji berries - Eat a handful of berries by mouth once a day, Disp: , Rfl:    Multiple Vitamins-Minerals (ONE-A-DAY MENS 50+) TABS, Take 1 tablet by mouth daily with breakfast., Disp: , Rfl:    MYRBETRIQ  50 MG TB24 tablet, Take 1 tablet (50 mg total) by mouth daily., Disp: 90 tablet, Rfl: 0   OHTUVAYRE  3 MG/2.5ML SUSP, Inhale 3 mg into the lungs 2 (two) times daily., Disp: , Rfl:    omeprazole  (PRILOSEC) 20 MG capsule, Take 1 capsule (20 mg total) by mouth 2 (two) times daily., Disp: 180 capsule, Rfl: 3   OXYGEN , Inhale 2 L/min into the lungs continuous., Disp: , Rfl:    pravastatin  (PRAVACHOL ) 40 MG tablet, Take 0.5 tablets (20 mg total) by mouth daily., Disp: 45 tablet, Rfl: 1   primidone  (MYSOLINE ) 50 MG tablet, Take 1 tablet (50 mg total) by mouth 2 (two) times daily., Disp: 180 tablet, Rfl: 1   SYSTANE COMPLETE PF 0.6 % SOLN, Place 1 drop into both eyes daily., Disp: , Rfl:    tadalafil  (CIALIS ) 20 MG tablet, TAKE 1/2 TO 1 (ONE-HALF TO ONE) TABLET BY MOUTH AS NEEDED 2 HOURS PRIOR TO SEXUAL ACTIVITY, Disp: 15 tablet, Rfl: 0   terazosin  (HYTRIN ) 1 MG capsule, Take 1 capsule by mouth twice daily, Disp: 180 capsule, Rfl: 0   triamcinolone  cream (KENALOG ) 0.1 %, Apply 1 Application topically 2 (two) times daily as needed (rash/itching)., Disp: , Rfl:    TYLENOL  500 MG tablet, Take 500 mg by mouth every 6 (six) hours as needed for mild pain (pain score 1-3)., Disp: ,  Rfl:    VENTOLIN  HFA 108 (90 Base) MCG/ACT inhaler, Inhale 2 puffs into the lungs every 6 (six) hours as needed for wheezing or shortness of breath., Disp: 18 g, Rfl: 5   VITAMIN  E PO, Take 1 capsule by mouth daily with breakfast., Disp: , Rfl:  [2]  Social History Tobacco Use  Smoking Status Former   Current packs/day: 0.00   Average packs/day: 1 pack/day for 45.0 years (45.0 ttl pk-yrs)   Types: Cigarettes   Start date: 01/21/1949   Quit date: 01/21/1994   Years since quitting: 30.0  Smokeless Tobacco Never

## 2024-02-12 ENCOUNTER — Encounter (HOSPITAL_COMMUNITY)

## 2024-02-17 ENCOUNTER — Encounter (HOSPITAL_COMMUNITY)

## 2024-02-18 NOTE — Progress Notes (Unsigned)
 [facility] [address], [address] [place], [place] [address] [phone]  Date:  02/19/2024   Name:  Johnathan [name]   DOB:  January 06, 1933   MRN:  [id number]  PCP:  Johnathan Harlene BROCKS, MD    Chief Complaint: No chief complaint on file.   History of Present Illness:  Johnathan [name] is a 89 y.o. very pleasant male patient who presents with the following:  Patient seen today with concern of sore throat.  I saw him most recently in December History of emphysema, diabetes, COPD, hypertension, former smoker. Johnathan [name] had been doing excellently for age until he got COVID in February 2022; he subsequently developed pulmonary fibrosis and is still using oxygen   He was hospitalized November 2025 with acute on chronic hypoxic respiratory failure and likely pneumonia  Discussed the use of AI scribe software for clinical note transcription with the patient, who gave verbal consent to proceed.  History of Present Illness     Patient Active Problem List   Diagnosis Date Noted   Severe sepsis (HCC) 06/18/2022   Hyperlipidemia associated with type 2 diabetes mellitus (HCC) 06/18/2022   Preop pulmonary/respiratory exam 04/09/2022   GERD (gastroesophageal reflux disease) 09/11/2021   Acute on chronic respiratory failure with hypoxia (HCC) 09/10/2021   Interstitial pulmonary disease (HCC) 03/19/2021   Hypertension associated with diabetes (HCC) 03/19/2021   Oral candidiasis 03/05/2021   Chronic respiratory failure with hypoxia (HCC) 03/05/2021   Trigger finger, right ring finger 02/13/2021   COPD with chronic bronchitis and emphysema (HCC) 04/26/2020   COVID-19 02/25/2020   Shingles 11/14/2019   Raynaud's syndrome 11/14/2019   Skin cancer 11/14/2019   Left inguinal hernia s/p lap repair w mesh 05/06/2019 05/06/2019   Recurrent right inguinal hernia s/p lap repair w mesh 05/06/2019 03/01/2019   Type 2 diabetes mellitus (HCC) 03/01/2019    Community acquired pneumonia of right lower lobe of lung 04/21/2014   COPD with acute exacerbation (HCC) 04/21/2014    Past Medical History:  Diagnosis Date   Anal fissure    COPD (chronic obstructive pulmonary disease) (HCC)    Diverticulosis    DM type 2 (diabetes mellitus, type 2) (HCC)    GERD (gastroesophageal reflux disease)    uses prilosec    Hypertension    Pneumonia 2018   Raynaud's disease    Shortness of breath dyspnea    WITH EXERTION   Sinusitis    Tremors of nervous system     Past Surgical History:  Procedure Laterality Date   COLONOSCOPY  03/27/2005   ESOPHAGOGASTRODUODENOSCOPY (EGD) WITH PROPOFOL  N/A 02/08/2021   Procedure: ESOPHAGOGASTRODUODENOSCOPY (EGD) WITH PROPOFOL ;  Surgeon: Teressa Toribio SQUIBB, MD;  Location: [facility];  Service: Endoscopy;  Laterality: N/A;   HERNIA REPAIR     INGUINAL HERNIA REPAIR N/A 05/06/2019   Procedure: LAPAROSCOPIC RIGHT AND LEFT INGUINAL HERNIA(S) REPAIR;  Surgeon: Sheldon Standing, MD;  Location: [facility];  Service: General;  Laterality: N/A;    Social History[1]  Family History  Problem Relation Age of Onset   Dementia Brother    Diabetes Brother    Heart attack Father    Dementia Father     Allergies[2]  Medication list has been reviewed and updated.  Medications Ordered Prior to Encounter[3]  Review of Systems:  As per HPI- otherwise negative.   Physical Examination: There were no vitals filed for this visit. There were no vitals filed for  this visit. There is no height or weight on file to calculate BMI. Ideal Body Weight:    GEN: no acute distress. HEENT: Atraumatic, Normocephalic.  Ears and Nose: No external deformity. CV: RRR, No M/G/R. No JVD. No thrill. No extra heart sounds. PULM: CTA B, no wheezes, crackles, rhonchi. No retractions. No resp. distress. No accessory muscle use. ABD: S, NT, ND, +BS. No rebound. No HSM. EXTR: No c/c/e PSYCH: Normally interactive. Conversant.     Assessment and Plan: No diagnosis found.  Assessment & Plan   Signed Harlene Schroeder, MD    [1]  Social History Tobacco Use   Smoking status: Former    Current packs/day: 0.00    Average packs/day: 1 pack/day for 45.0 years (45.0 ttl pk-yrs)    Types: Cigarettes    Start date: 01/21/1949    Quit date: 01/21/1994    Years since quitting: 30.0   Smokeless tobacco: Never  Substance Use Topics   Alcohol use: Yes    Comment: a drink daily with dinner   Drug use: No  [2] No Known Allergies [3]  Current Outpatient Medications on File Prior to Visit  Medication Sig Dispense Refill   amLODipine  (NORVASC ) 5 MG tablet Take 1 tablet (5 mg total) by mouth daily. 90 tablet 3   aspirin  EC 81 MG tablet Take 81 mg by mouth every morning.     B Complex-C (SUPER B COMPLEX PO) Take 1 tablet by mouth at bedtime.     benzonatate  (TESSALON ) 100 MG capsule Take 1 capsule (100 mg total) by mouth 2 (two) times daily as needed for cough. 20 capsule 0   Flaxseed, Linseed, (FLAXSEED OIL) 1200 MG CAPS Take 1,200 mg by mouth at bedtime.     Fluticasone -Umeclidin-Vilant (TRELEGY ELLIPTA ) 100-62.5-25 MCG/ACT AEPB Inhale 1 puff into the lungs daily. Rinse mouth after use 180 each 1   Ginkgo Biloba EXTR Take 1 capsule by mouth 2 (two) times daily.     guaiFENesin  (MUCINEX ) 600 MG 12 hr tablet Take 1 tablet (600 mg total) by mouth 2 (two) times daily as needed for cough. 30 tablet 0   Investigational - Study Medication Inject 450 mg into the skin See admin instructions. STUDY [id number] 450 mg or placebo SQ pre-filled syringe 450 mg     ketoconazole  (NIZORAL ) 2 % cream Apply 1 Application topically daily. Use as needed for fungal infection (Patient not taking: Reported on 12/26/2023) 15 g 1   ketoconazole  (NIZORAL ) 2 % shampoo Apply 1 Application topically 2 (two) times a week. Use as needed for itchy scalp (Patient not taking: Reported on 12/26/2023) 120 mL PRN   loratadine  (CLARITIN ) 10 MG tablet Take  10 mg by mouth in the morning.     meloxicam  (MOBIC ) 15 MG tablet TAKE 1 TABLET BY MOUTH IN THE MORNING 90 tablet 0   metFORMIN  (GLUCOPHAGE ) 500 MG tablet Take 0.5 tablets (250 mg total) by mouth daily. 45 tablet 3   Misc Natural Products (HIMALAYAN GOJI PO) Take by mouth See admin instructions. Goji berries - Eat a handful of berries by mouth once a day     Multiple Vitamins-Minerals (ONE-A-DAY MENS 50+) TABS Take 1 tablet by mouth daily with breakfast.     MYRBETRIQ  50 MG TB24 tablet Take 1 tablet (50 mg total) by mouth daily. 90 tablet 0   OHTUVAYRE  3 MG/2.5ML SUSP Inhale 3 mg into the lungs 2 (two) times daily.     omeprazole  (PRILOSEC) 20 MG capsule Take 1 capsule (  20 mg total) by mouth 2 (two) times daily. 180 capsule 3   OXYGEN  Inhale 2 L/min into the lungs continuous.     pravastatin  (PRAVACHOL ) 40 MG tablet Take 0.5 tablets (20 mg total) by mouth daily. 45 tablet 1   primidone  (MYSOLINE ) 50 MG tablet Take 1 tablet (50 mg total) by mouth 2 (two) times daily. 180 tablet 1   SYSTANE COMPLETE PF 0.6 % SOLN Place 1 drop into both eyes daily.     tadalafil  (CIALIS ) 20 MG tablet TAKE 1/2 TO 1 (ONE-HALF TO ONE) TABLET BY MOUTH AS NEEDED 2 HOURS PRIOR TO SEXUAL ACTIVITY 15 tablet 0   terazosin  (HYTRIN ) 1 MG capsule Take 1 capsule by mouth twice daily 180 capsule 0   triamcinolone  cream (KENALOG ) 0.1 % Apply 1 Application topically 2 (two) times daily as needed (rash/itching).     TYLENOL  500 MG tablet Take 500 mg by mouth every 6 (six) hours as needed for mild pain (pain score 1-3).     VENTOLIN  HFA 108 (90 Base) MCG/ACT inhaler Inhale 2 puffs into the lungs every 6 (six) hours as needed for wheezing or shortness of breath. 18 g 5   VITAMIN E PO Take 1 capsule by mouth daily with breakfast.     No current facility-administered medications on file prior to visit.   "

## 2024-02-19 ENCOUNTER — Encounter (HOSPITAL_COMMUNITY): Admission: RE | Admit: 2024-02-19 | Source: Ambulatory Visit

## 2024-02-19 ENCOUNTER — Telehealth (HOSPITAL_COMMUNITY): Payer: Self-pay

## 2024-02-19 ENCOUNTER — Ambulatory Visit: Admitting: Family Medicine

## 2024-02-19 NOTE — Telephone Encounter (Signed)
 Called pt and he stated he would not be able to attend class due to his driveway being icy. Appt canceled for today.

## 2024-02-22 NOTE — Progress Notes (Unsigned)
 [facility] [address] [place], [place] [address] [phone]  Date:  02/23/2024   Name:  Johnathan [name]   DOB:  Apr 18, 1932   MRN:  [id number]  PCP:  Watt Harlene BROCKS, MD    Chief Complaint: No chief complaint on file.   History of Present Illness:  Johnathan [name] is a 89 y.o. very pleasant male patient who presents with the following:  Patient seen today with concern of sore throat.  I saw him most recently in December History of emphysema, diabetes, COPD, hypertension, former smoker. Joseeduardo had been doing excellently for age until he got COVID in February 2022; he subsequently developed pulmonary fibrosis and is still using oxygen   He was hospitalized November 2025 with acute on chronic hypoxic respiratory failure and likely pneumonia  Discussed the use of AI scribe software for clinical note transcription with the patient, who gave verbal consent to proceed.  History of Present Illness     Patient Active Problem List   Diagnosis Date Noted   Severe sepsis (HCC) 06/18/2022   Hyperlipidemia associated with type 2 diabetes mellitus (HCC) 06/18/2022   Preop pulmonary/respiratory exam 04/09/2022   GERD (gastroesophageal reflux disease) 09/11/2021   Acute on chronic respiratory failure with hypoxia (HCC) 09/10/2021   Interstitial pulmonary disease (HCC) 03/19/2021   Hypertension associated with diabetes (HCC) 03/19/2021   Oral candidiasis 03/05/2021   Chronic respiratory failure with hypoxia (HCC) 03/05/2021   Trigger finger, right ring finger 02/13/2021   COPD with chronic bronchitis and emphysema (HCC) 04/26/2020   COVID-19 02/25/2020   Shingles 11/14/2019   Raynaud's syndrome 11/14/2019   Skin cancer 11/14/2019   Left inguinal hernia s/p lap repair w mesh 05/06/2019 05/06/2019   Recurrent right inguinal hernia s/p lap repair w mesh 05/06/2019 03/01/2019   Type 2 diabetes mellitus (HCC) 03/01/2019    Community acquired pneumonia of right lower lobe of lung 04/21/2014    Past Medical History:  Diagnosis Date   Anal fissure    COPD (chronic obstructive pulmonary disease) (HCC)    Diverticulosis    DM type 2 (diabetes mellitus, type 2) (HCC)    GERD (gastroesophageal reflux disease)    uses prilosec    Hypertension    Pneumonia 2018   Raynaud's disease    Shortness of breath dyspnea    WITH EXERTION   Sinusitis    Tremors of nervous system     Past Surgical History:  Procedure Laterality Date   COLONOSCOPY  03/27/2005   ESOPHAGOGASTRODUODENOSCOPY (EGD) WITH PROPOFOL  N/A 02/08/2021   Procedure: ESOPHAGOGASTRODUODENOSCOPY (EGD) WITH PROPOFOL ;  Surgeon: Teressa Toribio SQUIBB, MD;  Location: [facility];  Service: Endoscopy;  Laterality: N/A;   HERNIA REPAIR     INGUINAL HERNIA REPAIR N/A 05/06/2019   Procedure: LAPAROSCOPIC RIGHT AND LEFT INGUINAL HERNIA(S) REPAIR;  Surgeon: Sheldon Standing, MD;  Location: [facility];  Service: General;  Laterality: N/A;    Social History[1]  Family History  Problem Relation Age of Onset   Dementia Brother    Diabetes Brother    Heart attack Father    Dementia Father     Allergies[2]  Medication list has been reviewed and updated.  Medications Ordered Prior to Encounter[3]  Review of Systems:  As per HPI- otherwise negative.   Physical Examination: There were no vitals filed for this visit. There were no vitals filed for this visit. There is no height or weight  on file to calculate BMI. Ideal Body Weight:    GEN: no acute distress. HEENT: Atraumatic, Normocephalic.  Ears and Nose: No external deformity. CV: RRR, No M/G/R. No JVD. No thrill. No extra heart sounds. PULM: CTA B, no wheezes, crackles, rhonchi. No retractions. No resp. distress. No accessory muscle use. ABD: S, NT, ND, +BS. No rebound. No HSM. EXTR: No c/c/e PSYCH: Normally interactive. Conversant.    Assessment and Plan: No diagnosis  found.  Assessment & Plan   Signed Harlene Schroeder, MD     [1]  Social History Tobacco Use   Smoking status: Former    Current packs/day: 0.00    Average packs/day: 1 pack/day for 45.0 years (45.0 ttl pk-yrs)    Types: Cigarettes    Start date: 01/21/1949    Quit date: 01/21/1994    Years since quitting: 30.1   Smokeless tobacco: Never  Substance Use Topics   Alcohol use: Yes    Comment: a drink daily with dinner   Drug use: No  [2] No Known Allergies [3]  Current Outpatient Medications on File Prior to Visit  Medication Sig Dispense Refill   amLODipine  (NORVASC ) 5 MG tablet Take 1 tablet (5 mg total) by mouth daily. 90 tablet 3   aspirin  EC 81 MG tablet Take 81 mg by mouth every morning.     B Complex-C (SUPER B COMPLEX PO) Take 1 tablet by mouth at bedtime.     benzonatate  (TESSALON ) 100 MG capsule Take 1 capsule (100 mg total) by mouth 2 (two) times daily as needed for cough. 20 capsule 0   Flaxseed, Linseed, (FLAXSEED OIL) 1200 MG CAPS Take 1,200 mg by mouth at bedtime.     Fluticasone -Umeclidin-Vilant (TRELEGY ELLIPTA ) 100-62.5-25 MCG/ACT AEPB Inhale 1 puff into the lungs daily. Rinse mouth after use 180 each 1   Ginkgo Biloba EXTR Take 1 capsule by mouth 2 (two) times daily.     guaiFENesin  (MUCINEX ) 600 MG 12 hr tablet Take 1 tablet (600 mg total) by mouth 2 (two) times daily as needed for cough. 30 tablet 0   Investigational - Study Medication Inject 450 mg into the skin See admin instructions. STUDY [id number] 450 mg or placebo SQ pre-filled syringe 450 mg     ketoconazole  (NIZORAL ) 2 % cream Apply 1 Application topically daily. Use as needed for fungal infection (Patient not taking: Reported on 12/26/2023) 15 g 1   ketoconazole  (NIZORAL ) 2 % shampoo Apply 1 Application topically 2 (two) times a week. Use as needed for itchy scalp (Patient not taking: Reported on 12/26/2023) 120 mL PRN   loratadine  (CLARITIN ) 10 MG tablet Take 10 mg by mouth in the morning.      meloxicam  (MOBIC ) 15 MG tablet TAKE 1 TABLET BY MOUTH IN THE MORNING 90 tablet 0   metFORMIN  (GLUCOPHAGE ) 500 MG tablet Take 0.5 tablets (250 mg total) by mouth daily. 45 tablet 3   Misc Natural Products (HIMALAYAN GOJI PO) Take by mouth See admin instructions. Goji berries - Eat a handful of berries by mouth once a day     Multiple Vitamins-Minerals (ONE-A-DAY MENS 50+) TABS Take 1 tablet by mouth daily with breakfast.     MYRBETRIQ  50 MG TB24 tablet Take 1 tablet (50 mg total) by mouth daily. 90 tablet 0   OHTUVAYRE  3 MG/2.5ML SUSP Inhale 3 mg into the lungs 2 (two) times daily.     omeprazole  (PRILOSEC) 20 MG capsule Take 1 capsule (20 mg total) by mouth 2 (two)  times daily. 180 capsule 3   OXYGEN  Inhale 2 L/min into the lungs continuous.     pravastatin  (PRAVACHOL ) 40 MG tablet Take 0.5 tablets (20 mg total) by mouth daily. 45 tablet 1   primidone  (MYSOLINE ) 50 MG tablet Take 1 tablet (50 mg total) by mouth 2 (two) times daily. 180 tablet 1   SYSTANE COMPLETE PF 0.6 % SOLN Place 1 drop into both eyes daily.     tadalafil  (CIALIS ) 20 MG tablet TAKE 1/2 TO 1 (ONE-HALF TO ONE) TABLET BY MOUTH AS NEEDED 2 HOURS PRIOR TO SEXUAL ACTIVITY 15 tablet 0   terazosin  (HYTRIN ) 1 MG capsule Take 1 capsule by mouth twice daily 180 capsule 0   triamcinolone  cream (KENALOG ) 0.1 % Apply 1 Application topically 2 (two) times daily as needed (rash/itching).     TYLENOL  500 MG tablet Take 500 mg by mouth every 6 (six) hours as needed for mild pain (pain score 1-3).     VENTOLIN  HFA 108 (90 Base) MCG/ACT inhaler Inhale 2 puffs into the lungs every 6 (six) hours as needed for wheezing or shortness of breath. 18 g 5   VITAMIN E PO Take 1 capsule by mouth daily with breakfast.     No current facility-administered medications on file prior to visit.   "

## 2024-02-23 ENCOUNTER — Ambulatory Visit: Admitting: Family Medicine

## 2024-02-24 ENCOUNTER — Encounter (HOSPITAL_COMMUNITY)

## 2024-02-25 ENCOUNTER — Telehealth (HOSPITAL_COMMUNITY): Payer: Self-pay

## 2024-02-25 NOTE — Telephone Encounter (Signed)
 Called pt to check on him since he has missed PR class. He stated he has not been out of his house due to the weather and will try to make it class tomorrow.

## 2024-02-26 ENCOUNTER — Encounter (HOSPITAL_COMMUNITY): Admission: RE | Admit: 2024-02-26 | Discharge: 2024-02-26 | Attending: Pulmonary Disease

## 2024-02-26 DIAGNOSIS — J449 Chronic obstructive pulmonary disease, unspecified: Secondary | ICD-10-CM

## 2024-02-26 LAB — GLUCOSE, CAPILLARY: Glucose-Capillary: 116 mg/dL — ABNORMAL HIGH (ref 70–99)

## 2024-02-26 NOTE — Progress Notes (Signed)
 Daily Session Note  Patient Details  Name: Johnathan [name] MRN: [id number] Date of Birth: [birth year] Referring Provider:   [name] Pulmonary Rehab Walk Test from 12/26/2023 in [facility]  Referring Provider Mannam    Encounter Date: 02/26/2024  Check In:  Session Check In - 02/26/24 1420       Check-In   Supervising physician immediately available to respond to emergencies [facility] MD immediately available    Physician(s) Orren Fabry, NP    Location [facility]    Staff Present Ronal Levin, RN, BSN;Maghan Jessee Midge BS, ACSM-CEP, Exercise Physiologist;Casey Claudene Neita Moats, MS, ACSM-CEP, Exercise Physiologist    Virtual Visit No    Medication changes reported     No    Fall or balance concerns reported    No    Tobacco Cessation No Change    Warm-up and Cool-down Performed as group-led instruction    Resistance Training Performed Yes    VAD Patient? No    PAD/SET Patient? No      Pain Assessment   Currently in Pain? No/denies    Multiple Pain Sites No          Capillary Blood Glucose: Results for orders placed or performed during the hospital encounter of 02/26/24 (from the past 24 hours)  Glucose, capillary     Status: Abnormal   Collection Time: 02/26/24  1:27 PM  Result Value Ref Range   Glucose-Capillary 116 (H) 70 - 99 mg/dL      Tobacco Use Ypdunmb[8]  Goals Met:  Proper associated with RPD/PD & O2 Sat Exercise tolerated well No report of concerns or symptoms today Strength training completed today  Goals Unmet:  Not Applicable  Comments: Service time is from 1325 to 1446.    Dr. Slater Staff is Medical Director for Pulmonary Rehab at [facility].     [1]  Social History Tobacco Use  Smoking Status Former   Current packs/day: 0.00   Average packs/day: 1 pack/day for 45.0 years (45.0 ttl pk-yrs)   Types: Cigarettes   Start date: 01/21/1949   Quit date:  01/21/1994   Years since quitting: 30.1  Smokeless Tobacco Never

## 2024-02-26 NOTE — Progress Notes (Signed)
" °   02/26/24 1532  6 Minute Walk  Phase Mid Program  Distance 780 feet  Walk Time 6 minutes  # of Rest Breaks 0  MPH 1.48  METS 1.44  VO2 Peak 5.05  Symptoms Yes (comment)  Comments Tested pt's POC as he always uses setting 2. Required setting 3  Resting HR 79 bpm  Resting BP 122/70  Resting Oxygen  Saturation  95 %  Exercise Oxygen  Saturation  during 6 min walk 87 %  Max Ex. HR 100 bpm  Interval HR  1 Minute HR 90  2 Minute HR 96  3 Minute HR 96  4 Minute HR 94  5 Minute HR 93  6 Minute HR 100  2 Minute Post HR 82  Interval Oxygen   Baseline Oxygen  Saturation % 95 %  1 Minute Oxygen  Saturation % 90 %  1 Minute Liters of Oxygen  2 L (POC)  2 Minute Oxygen  Saturation % 89 %  2 Minute Liters of Oxygen  2 L (POC)  3 Minute Oxygen  Saturation % 87 %  3 Minute Liters of Oxygen  2 L (POC, turned up to 3)  4 Minute Oxygen  Saturation % 89 %  4 Minute Liters of Oxygen  3 L  5 Minute Oxygen  Saturation % 89 %  5 Minute Liters of Oxygen  3 L (POC)  6 Minute Oxygen  Saturation % 88 %  6 Minute Liters of Oxygen  3 L (POC)  2 Minute Post Oxygen  Saturation % 91 %  2 Minute Post Liters of Oxygen  4 L (down to 86 3 POC, increased to 4)    Pt needed setting 3 on his POC most of the 6 min walk test. Needed setting 4 POC at the end. Aliene Aris BS, ACSM-CEP 02/26/2024 3:52 PM  "

## 2024-02-27 ENCOUNTER — Other Ambulatory Visit: Payer: Self-pay | Admitting: Family Medicine

## 2024-03-02 ENCOUNTER — Encounter (HOSPITAL_COMMUNITY)

## 2024-03-03 ENCOUNTER — Ambulatory Visit: Admitting: Family Medicine

## 2024-03-04 ENCOUNTER — Encounter (HOSPITAL_COMMUNITY)

## 2024-03-09 ENCOUNTER — Encounter (HOSPITAL_COMMUNITY)

## 2024-03-11 ENCOUNTER — Encounter (HOSPITAL_COMMUNITY)

## 2024-03-16 ENCOUNTER — Encounter (HOSPITAL_COMMUNITY)

## 2024-03-18 ENCOUNTER — Encounter (HOSPITAL_COMMUNITY)

## 2024-03-23 ENCOUNTER — Encounter (HOSPITAL_COMMUNITY)

## 2024-03-25 ENCOUNTER — Ambulatory Visit: Admitting: Pulmonary Disease

## 2024-03-25 ENCOUNTER — Encounter (HOSPITAL_COMMUNITY)

## 2024-04-28 ENCOUNTER — Ambulatory Visit: Admitting: Family Medicine
